# Patient Record
Sex: Female | Born: 1940 | Race: White | Hispanic: No | Smoking: Never smoker
Health system: Southern US, Community
[De-identification: ages and names within clinical notes are randomized; demographics above are authoritative.]

## PROBLEM LIST (undated history)

## (undated) DIAGNOSIS — F32A Depression, unspecified: Secondary | ICD-10-CM

## (undated) DIAGNOSIS — Z973 Presence of spectacles and contact lenses: Secondary | ICD-10-CM

## (undated) DIAGNOSIS — N814 Uterovaginal prolapse, unspecified: Secondary | ICD-10-CM

## (undated) DIAGNOSIS — S82899A Other fracture of unspecified lower leg, initial encounter for closed fracture: Secondary | ICD-10-CM

## (undated) DIAGNOSIS — F419 Anxiety disorder, unspecified: Secondary | ICD-10-CM

## (undated) DIAGNOSIS — M1990 Unspecified osteoarthritis, unspecified site: Secondary | ICD-10-CM

## (undated) DIAGNOSIS — D509 Iron deficiency anemia, unspecified: Secondary | ICD-10-CM

## (undated) DIAGNOSIS — L0390 Cellulitis, unspecified: Secondary | ICD-10-CM

## (undated) DIAGNOSIS — Z8709 Personal history of other diseases of the respiratory system: Secondary | ICD-10-CM

## (undated) DIAGNOSIS — F4323 Adjustment disorder with mixed anxiety and depressed mood: Secondary | ICD-10-CM

## (undated) DIAGNOSIS — Z8489 Family history of other specified conditions: Secondary | ICD-10-CM

## (undated) DIAGNOSIS — I10 Essential (primary) hypertension: Secondary | ICD-10-CM

## (undated) DIAGNOSIS — E785 Hyperlipidemia, unspecified: Secondary | ICD-10-CM

## (undated) DIAGNOSIS — K219 Gastro-esophageal reflux disease without esophagitis: Secondary | ICD-10-CM

## (undated) DIAGNOSIS — IMO0001 Reserved for inherently not codable concepts without codable children: Secondary | ICD-10-CM

## (undated) DIAGNOSIS — K2950 Unspecified chronic gastritis without bleeding: Secondary | ICD-10-CM

## (undated) DIAGNOSIS — E559 Vitamin D deficiency, unspecified: Secondary | ICD-10-CM

## (undated) DIAGNOSIS — K2970 Gastritis, unspecified, without bleeding: Secondary | ICD-10-CM

## (undated) DIAGNOSIS — Z960 Presence of urogenital implants: Secondary | ICD-10-CM

## (undated) DIAGNOSIS — R001 Bradycardia, unspecified: Secondary | ICD-10-CM

## (undated) DIAGNOSIS — I471 Supraventricular tachycardia, unspecified: Secondary | ICD-10-CM

## (undated) DIAGNOSIS — J45909 Unspecified asthma, uncomplicated: Secondary | ICD-10-CM

## (undated) DIAGNOSIS — D72819 Decreased white blood cell count, unspecified: Secondary | ICD-10-CM

## (undated) DIAGNOSIS — Z5189 Encounter for other specified aftercare: Secondary | ICD-10-CM

## (undated) DIAGNOSIS — J309 Allergic rhinitis, unspecified: Secondary | ICD-10-CM

## (undated) DIAGNOSIS — M810 Age-related osteoporosis without current pathological fracture: Secondary | ICD-10-CM

## (undated) DIAGNOSIS — K649 Unspecified hemorrhoids: Secondary | ICD-10-CM

## (undated) DIAGNOSIS — D500 Iron deficiency anemia secondary to blood loss (chronic): Secondary | ICD-10-CM

## (undated) DIAGNOSIS — E7800 Pure hypercholesterolemia, unspecified: Secondary | ICD-10-CM

## (undated) DIAGNOSIS — R002 Palpitations: Secondary | ICD-10-CM

## (undated) DIAGNOSIS — F329 Major depressive disorder, single episode, unspecified: Secondary | ICD-10-CM

## (undated) DIAGNOSIS — K5909 Other constipation: Secondary | ICD-10-CM

## (undated) DIAGNOSIS — A419 Sepsis, unspecified organism: Secondary | ICD-10-CM

## (undated) DIAGNOSIS — K625 Hemorrhage of anus and rectum: Secondary | ICD-10-CM

## (undated) HISTORY — PX: EYE SURGERY: SHX253

## (undated) HISTORY — DX: Uterovaginal prolapse, unspecified: N81.4

## (undated) HISTORY — DX: Iron deficiency anemia, unspecified: D50.9

## (undated) HISTORY — PX: PANENDOSCOPY: SHX2159

## (undated) HISTORY — PX: CATARACT EXTRACTION: SUR2

## (undated) HISTORY — PX: FRACTURE SURGERY: SHX138

## (undated) HISTORY — DX: Other fracture of unspecified lower leg, initial encounter for closed fracture: S82.899A

## (undated) HISTORY — PX: TUBAL LIGATION: SHX77

## (undated) HISTORY — DX: Allergic rhinitis, unspecified: J30.9

## (undated) HISTORY — DX: Reserved for inherently not codable concepts without codable children: IMO0001

## (undated) HISTORY — DX: Sepsis, unspecified organism: A41.9

## (undated) HISTORY — DX: Adjustment disorder with mixed anxiety and depressed mood: F43.23

## (undated) HISTORY — DX: Unspecified osteoarthritis, unspecified site: M19.90

## (undated) HISTORY — DX: Major depressive disorder, single episode, unspecified: F32.9

## (undated) HISTORY — DX: Cellulitis, unspecified: L03.90

## (undated) HISTORY — DX: Age-related osteoporosis without current pathological fracture: M81.0

## (undated) HISTORY — DX: Vitamin D deficiency, unspecified: E55.9

## (undated) HISTORY — DX: Pure hypercholesterolemia, unspecified: E78.00

## (undated) HISTORY — PX: CATARACT EXTRACTION W/ INTRAOCULAR LENS IMPLANT: SHX1309

## (undated) HISTORY — DX: Unspecified asthma, uncomplicated: J45.909

## (undated) HISTORY — DX: Gastritis, unspecified, without bleeding: K29.70

## (undated) HISTORY — DX: Encounter for other specified aftercare: Z51.89

## (undated) HISTORY — DX: Unspecified hemorrhoids: K64.9

---

## 1968-04-25 HISTORY — PX: ORIF FINGER / THUMB FRACTURE: SUR932

## 1975-04-26 HISTORY — PX: REVISION AMPUTATION OF FINGER: SHX2346

## 1998-04-25 HISTORY — PX: CATARACT EXTRACTION W/ INTRAOCULAR LENS IMPLANT: SHX1309

## 2000-01-27 ENCOUNTER — Other Ambulatory Visit: Admission: RE | Admit: 2000-01-27 | Discharge: 2000-01-27 | Payer: Self-pay | Admitting: Internal Medicine

## 2000-04-25 DIAGNOSIS — K2970 Gastritis, unspecified, without bleeding: Secondary | ICD-10-CM

## 2000-04-25 HISTORY — DX: Gastritis, unspecified, without bleeding: K29.70

## 2001-02-02 ENCOUNTER — Other Ambulatory Visit: Admission: RE | Admit: 2001-02-02 | Discharge: 2001-02-02 | Payer: Self-pay | Admitting: Internal Medicine

## 2001-03-12 ENCOUNTER — Encounter: Payer: Self-pay | Admitting: Gastroenterology

## 2001-03-20 ENCOUNTER — Encounter: Payer: Self-pay | Admitting: Gastroenterology

## 2001-03-28 ENCOUNTER — Ambulatory Visit (HOSPITAL_COMMUNITY): Admission: RE | Admit: 2001-03-28 | Discharge: 2001-03-28 | Payer: Self-pay | Admitting: Gastroenterology

## 2001-03-28 ENCOUNTER — Encounter: Payer: Self-pay | Admitting: Gastroenterology

## 2002-02-01 ENCOUNTER — Encounter: Payer: Self-pay | Admitting: Internal Medicine

## 2002-02-01 ENCOUNTER — Inpatient Hospital Stay (HOSPITAL_COMMUNITY): Admission: RE | Admit: 2002-02-01 | Discharge: 2002-02-03 | Payer: Self-pay | Admitting: Internal Medicine

## 2002-02-02 ENCOUNTER — Encounter (INDEPENDENT_AMBULATORY_CARE_PROVIDER_SITE_OTHER): Payer: Self-pay | Admitting: Specialist

## 2002-02-02 ENCOUNTER — Encounter: Payer: Self-pay | Admitting: Internal Medicine

## 2002-09-24 ENCOUNTER — Ambulatory Visit (HOSPITAL_COMMUNITY): Admission: RE | Admit: 2002-09-24 | Discharge: 2002-09-24 | Payer: Self-pay | Admitting: Gastroenterology

## 2003-04-26 LAB — HM COLONOSCOPY: HM Colonoscopy: NORMAL

## 2004-02-06 ENCOUNTER — Ambulatory Visit: Payer: Self-pay | Admitting: Internal Medicine

## 2004-02-17 ENCOUNTER — Other Ambulatory Visit: Admission: RE | Admit: 2004-02-17 | Discharge: 2004-02-17 | Payer: Self-pay | Admitting: Internal Medicine

## 2004-05-04 ENCOUNTER — Ambulatory Visit: Payer: Self-pay | Admitting: Internal Medicine

## 2004-07-13 ENCOUNTER — Ambulatory Visit: Payer: Self-pay | Admitting: Internal Medicine

## 2004-07-30 ENCOUNTER — Encounter: Admission: RE | Admit: 2004-07-30 | Discharge: 2004-07-30 | Payer: Self-pay | Admitting: Internal Medicine

## 2004-07-30 ENCOUNTER — Ambulatory Visit: Payer: Self-pay | Admitting: Internal Medicine

## 2004-08-05 ENCOUNTER — Ambulatory Visit: Payer: Self-pay | Admitting: Hematology and Oncology

## 2004-08-13 ENCOUNTER — Encounter: Payer: Self-pay | Admitting: Internal Medicine

## 2004-08-13 ENCOUNTER — Encounter (INDEPENDENT_AMBULATORY_CARE_PROVIDER_SITE_OTHER): Payer: Self-pay | Admitting: *Deleted

## 2004-08-13 ENCOUNTER — Other Ambulatory Visit: Admission: RE | Admit: 2004-08-13 | Discharge: 2004-08-13 | Payer: Self-pay | Admitting: Hematology and Oncology

## 2004-09-23 ENCOUNTER — Ambulatory Visit: Payer: Self-pay | Admitting: Hematology and Oncology

## 2004-10-12 ENCOUNTER — Ambulatory Visit (HOSPITAL_COMMUNITY): Admission: RE | Admit: 2004-10-12 | Discharge: 2004-10-12 | Payer: Self-pay | Admitting: Hematology and Oncology

## 2004-11-15 ENCOUNTER — Ambulatory Visit: Payer: Self-pay | Admitting: Hematology and Oncology

## 2005-02-01 ENCOUNTER — Ambulatory Visit: Payer: Self-pay | Admitting: Hematology and Oncology

## 2005-03-15 ENCOUNTER — Ambulatory Visit: Payer: Self-pay | Admitting: Internal Medicine

## 2005-03-29 ENCOUNTER — Encounter: Payer: Self-pay | Admitting: Internal Medicine

## 2005-03-29 ENCOUNTER — Other Ambulatory Visit: Admission: RE | Admit: 2005-03-29 | Discharge: 2005-03-29 | Payer: Self-pay | Admitting: Internal Medicine

## 2005-03-29 ENCOUNTER — Ambulatory Visit: Payer: Self-pay | Admitting: Internal Medicine

## 2005-03-29 LAB — CONVERTED CEMR LAB: Pap Smear: NORMAL

## 2005-03-31 ENCOUNTER — Ambulatory Visit: Payer: Self-pay | Admitting: Hematology and Oncology

## 2005-11-21 ENCOUNTER — Ambulatory Visit: Payer: Self-pay | Admitting: Internal Medicine

## 2006-01-23 ENCOUNTER — Ambulatory Visit: Payer: Self-pay | Admitting: Internal Medicine

## 2006-03-24 ENCOUNTER — Ambulatory Visit: Payer: Self-pay | Admitting: Internal Medicine

## 2006-03-24 LAB — CONVERTED CEMR LAB
ALT: 16 units/L (ref 0–40)
AST: 30 units/L (ref 0–37)
Albumin: 3.8 g/dL (ref 3.5–5.2)
Alkaline Phosphatase: 64 units/L (ref 39–117)
BUN: 20 mg/dL (ref 6–23)
Basophils Absolute: 0 10*3/uL (ref 0.0–0.1)
Basophils Relative: 0.4 % (ref 0.0–1.0)
CO2: 32 meq/L (ref 19–32)
Calcium: 9.4 mg/dL (ref 8.4–10.5)
Chloride: 105 meq/L (ref 96–112)
Chol/HDL Ratio, serum: 3.2
Cholesterol: 224 mg/dL (ref 0–200)
Creatinine, Ser: 0.7 mg/dL (ref 0.4–1.2)
Eosinophil percent: 4.6 % (ref 0.0–5.0)
GFR calc non Af Amer: 90 mL/min
Glomerular Filtration Rate, Af Am: 108 mL/min/{1.73_m2}
Glucose, Bld: 83 mg/dL (ref 70–99)
HCT: 31.5 % — ABNORMAL LOW (ref 36.0–46.0)
HDL: 69.3 mg/dL (ref >39.0)
Hemoglobin: 10.1 g/dL — ABNORMAL LOW (ref 12.0–15.0)
LDL DIRECT: 140.2 mg/dL
Lymphocytes Relative: 38.4 % (ref 12.0–46.0)
MCHC: 32.2 g/dL (ref 30.0–36.0)
MCV: 85 fL (ref 78.0–100.0)
Monocytes Absolute: 0.3 10*3/uL (ref 0.2–0.7)
Monocytes Relative: 8.8 % (ref 3.0–11.0)
Neutro Abs: 1.6 10*3/uL (ref 1.4–7.7)
Neutrophils Relative %: 47.8 % (ref 43.0–77.0)
Platelets: 194 10*3/uL (ref 150–400)
Potassium: 4.2 meq/L (ref 3.5–5.1)
RBC: 3.7 M/uL — ABNORMAL LOW (ref 3.87–5.11)
RDW: 16.8 % — ABNORMAL HIGH (ref 11.5–14.6)
Sodium: 143 meq/L (ref 135–145)
TSH: 1.51 microintl units/mL (ref 0.35–5.50)
Total Bilirubin: 0.7 mg/dL (ref 0.3–1.2)
Total Protein: 6.9 g/dL (ref 6.0–8.3)
Triglyceride fasting, serum: 64 mg/dL (ref 0–149)
VLDL: 13 mg/dL (ref 0–40)
WBC: 3.2 10*3/uL — ABNORMAL LOW (ref 4.5–10.5)

## 2006-03-31 ENCOUNTER — Ambulatory Visit: Payer: Self-pay | Admitting: Internal Medicine

## 2006-06-20 ENCOUNTER — Ambulatory Visit: Payer: Self-pay | Admitting: Internal Medicine

## 2006-09-20 ENCOUNTER — Ambulatory Visit: Payer: Self-pay | Admitting: Internal Medicine

## 2006-09-20 LAB — CONVERTED CEMR LAB
Basophils Absolute: 0 10*3/uL (ref 0.0–0.1)
Basophils Relative: 1.2 % — ABNORMAL HIGH (ref 0.0–1.0)
Eosinophils Absolute: 0.1 10*3/uL (ref 0.0–0.6)
Eosinophils Relative: 3.7 % (ref 0.0–5.0)
Ferritin: 17.4 ng/mL (ref 10.0–291.0)
HCT: 33.3 % — ABNORMAL LOW (ref 36.0–46.0)
Hemoglobin: 11.2 g/dL — ABNORMAL LOW (ref 12.0–15.0)
Lymphocytes Relative: 32.7 % (ref 12.0–46.0)
MCHC: 33.7 g/dL (ref 30.0–36.0)
MCV: 85.9 fL (ref 78.0–100.0)
Monocytes Absolute: 0.2 10*3/uL (ref 0.2–0.7)
Monocytes Relative: 9.1 % (ref 3.0–11.0)
Neutro Abs: 1.4 10*3/uL (ref 1.4–7.7)
Neutrophils Relative %: 53.3 % (ref 43.0–77.0)
Platelets: 186 10*3/uL (ref 150–400)
RBC: 3.87 M/uL (ref 3.87–5.11)
RDW: 15.7 % — ABNORMAL HIGH (ref 11.5–14.6)
WBC: 2.6 10*3/uL — ABNORMAL LOW (ref 4.5–10.5)

## 2006-09-21 ENCOUNTER — Encounter: Payer: Self-pay | Admitting: Internal Medicine

## 2006-09-21 LAB — CONVERTED CEMR LAB
Calcium, Total (PTH): 10.2 mg/dL (ref 8.4–10.5)
PTH: 18.7 pg/mL (ref 14.0–72.0)
Vit D, 1,25-Dihydroxy: 34 (ref 20–57)

## 2006-11-06 ENCOUNTER — Ambulatory Visit: Payer: Self-pay | Admitting: Internal Medicine

## 2006-12-15 ENCOUNTER — Telehealth: Payer: Self-pay | Admitting: Internal Medicine

## 2007-01-03 DIAGNOSIS — M1990 Unspecified osteoarthritis, unspecified site: Secondary | ICD-10-CM | POA: Insufficient documentation

## 2007-01-03 DIAGNOSIS — J45909 Unspecified asthma, uncomplicated: Secondary | ICD-10-CM

## 2007-01-03 DIAGNOSIS — M810 Age-related osteoporosis without current pathological fracture: Secondary | ICD-10-CM

## 2007-01-03 DIAGNOSIS — D500 Iron deficiency anemia secondary to blood loss (chronic): Secondary | ICD-10-CM | POA: Insufficient documentation

## 2007-01-03 DIAGNOSIS — D509 Iron deficiency anemia, unspecified: Secondary | ICD-10-CM

## 2007-01-03 DIAGNOSIS — J309 Allergic rhinitis, unspecified: Secondary | ICD-10-CM | POA: Insufficient documentation

## 2007-01-03 DIAGNOSIS — F329 Major depressive disorder, single episode, unspecified: Secondary | ICD-10-CM

## 2007-01-03 DIAGNOSIS — D649 Anemia, unspecified: Secondary | ICD-10-CM | POA: Insufficient documentation

## 2007-01-03 DIAGNOSIS — F3289 Other specified depressive episodes: Secondary | ICD-10-CM

## 2007-01-03 HISTORY — DX: Unspecified asthma, uncomplicated: J45.909

## 2007-01-03 HISTORY — DX: Major depressive disorder, single episode, unspecified: F32.9

## 2007-01-03 HISTORY — DX: Iron deficiency anemia, unspecified: D50.9

## 2007-01-03 HISTORY — DX: Allergic rhinitis, unspecified: J30.9

## 2007-01-03 HISTORY — DX: Other specified depressive episodes: F32.89

## 2007-01-03 HISTORY — DX: Unspecified osteoarthritis, unspecified site: M19.90

## 2007-01-03 HISTORY — DX: Age-related osteoporosis without current pathological fracture: M81.0

## 2007-01-12 ENCOUNTER — Telehealth: Payer: Self-pay | Admitting: Internal Medicine

## 2007-01-24 ENCOUNTER — Telehealth: Payer: Self-pay | Admitting: Internal Medicine

## 2007-02-27 ENCOUNTER — Telehealth: Payer: Self-pay | Admitting: *Deleted

## 2007-03-28 ENCOUNTER — Encounter: Payer: Self-pay | Admitting: Internal Medicine

## 2007-04-06 ENCOUNTER — Encounter: Payer: Self-pay | Admitting: Internal Medicine

## 2007-04-06 ENCOUNTER — Other Ambulatory Visit: Admission: RE | Admit: 2007-04-06 | Discharge: 2007-04-06 | Payer: Self-pay | Admitting: Internal Medicine

## 2007-04-06 ENCOUNTER — Ambulatory Visit: Payer: Self-pay | Admitting: Internal Medicine

## 2007-04-06 DIAGNOSIS — E559 Vitamin D deficiency, unspecified: Secondary | ICD-10-CM

## 2007-04-06 DIAGNOSIS — R0789 Other chest pain: Secondary | ICD-10-CM | POA: Insufficient documentation

## 2007-04-06 DIAGNOSIS — N814 Uterovaginal prolapse, unspecified: Secondary | ICD-10-CM | POA: Insufficient documentation

## 2007-04-06 HISTORY — DX: Vitamin D deficiency, unspecified: E55.9

## 2007-04-06 HISTORY — DX: Uterovaginal prolapse, unspecified: N81.4

## 2007-04-06 LAB — CONVERTED CEMR LAB
Bilirubin Urine: NEGATIVE
Glucose, Urine, Semiquant: NEGATIVE
Ketones, urine, test strip: NEGATIVE
Nitrite: NEGATIVE
Protein, U semiquant: NEGATIVE
Specific Gravity, Urine: 1.02
Urobilinogen, UA: 0.2
Vit D, 1,25-Dihydroxy: 39 (ref 30–89)
WBC Urine, dipstick: NEGATIVE
pH: 7

## 2007-04-09 ENCOUNTER — Telehealth: Payer: Self-pay | Admitting: Internal Medicine

## 2007-04-12 LAB — CONVERTED CEMR LAB
ALT: 19 units/L (ref 0–35)
AST: 25 units/L (ref 0–37)
Albumin: 4 g/dL (ref 3.5–5.2)
Alkaline Phosphatase: 68 units/L (ref 39–117)
BUN: 19 mg/dL (ref 6–23)
Basophils Absolute: 0 10*3/uL (ref 0.0–0.1)
Basophils Relative: 0.3 % (ref 0.0–1.0)
Bilirubin, Direct: 0.1 mg/dL (ref 0.0–0.3)
CO2: 33 meq/L — ABNORMAL HIGH (ref 19–32)
Calcium: 10 mg/dL (ref 8.4–10.5)
Chloride: 102 meq/L (ref 96–112)
Cholesterol: 242 mg/dL (ref 0–200)
Creatinine, Ser: 0.8 mg/dL (ref 0.4–1.2)
Direct LDL: 151.5 mg/dL
Eosinophils Absolute: 0.2 10*3/uL (ref 0.0–0.6)
Eosinophils Relative: 5.8 % — ABNORMAL HIGH (ref 0.0–5.0)
Ferritin: 18.1 ng/mL (ref 10.0–291.0)
GFR calc Af Amer: 93 mL/min
GFR calc non Af Amer: 77 mL/min
Glucose, Bld: 79 mg/dL (ref 70–99)
HCT: 37.2 % (ref 36.0–46.0)
HDL: 66.5 mg/dL (ref >39.0)
Hemoglobin: 12.2 g/dL (ref 12.0–15.0)
Lymphocytes Relative: 41.8 % (ref 12.0–46.0)
MCHC: 32.9 g/dL (ref 30.0–36.0)
MCV: 86.2 fL (ref 78.0–100.0)
Monocytes Absolute: 0.3 10*3/uL (ref 0.2–0.7)
Monocytes Relative: 9 % (ref 3.0–11.0)
Neutro Abs: 1.3 10*3/uL — ABNORMAL LOW (ref 1.4–7.7)
Neutrophils Relative %: 43.1 % (ref 43.0–77.0)
Platelets: 183 10*3/uL (ref 150–400)
Potassium: 4.1 meq/L (ref 3.5–5.1)
RBC: 4.31 M/uL (ref 3.87–5.11)
RDW: 14.2 % (ref 11.5–14.6)
Sodium: 142 meq/L (ref 135–145)
TSH: 0.85 microintl units/mL (ref 0.35–5.50)
Total Bilirubin: 0.6 mg/dL (ref 0.3–1.2)
Total CHOL/HDL Ratio: 3.6
Total Protein: 7.1 g/dL (ref 6.0–8.3)
Triglycerides: 89 mg/dL (ref 0–149)
VLDL: 18 mg/dL (ref 0–40)
WBC: 3 10*3/uL — ABNORMAL LOW (ref 4.5–10.5)

## 2007-04-15 LAB — CONVERTED CEMR LAB: Pap Smear: NORMAL

## 2007-04-17 ENCOUNTER — Ambulatory Visit: Payer: Self-pay

## 2007-04-17 ENCOUNTER — Encounter: Payer: Self-pay | Admitting: Internal Medicine

## 2007-04-25 ENCOUNTER — Telehealth: Payer: Self-pay | Admitting: Internal Medicine

## 2007-06-28 ENCOUNTER — Encounter: Payer: Self-pay | Admitting: Internal Medicine

## 2007-08-06 ENCOUNTER — Encounter: Payer: Self-pay | Admitting: Internal Medicine

## 2008-02-22 ENCOUNTER — Telehealth: Payer: Self-pay | Admitting: *Deleted

## 2008-04-02 ENCOUNTER — Encounter: Payer: Self-pay | Admitting: Internal Medicine

## 2008-05-06 ENCOUNTER — Telehealth: Payer: Self-pay | Admitting: *Deleted

## 2008-05-19 ENCOUNTER — Ambulatory Visit: Payer: Self-pay | Admitting: Internal Medicine

## 2008-05-19 DIAGNOSIS — E7800 Pure hypercholesterolemia, unspecified: Secondary | ICD-10-CM

## 2008-05-19 DIAGNOSIS — F4323 Adjustment disorder with mixed anxiety and depressed mood: Secondary | ICD-10-CM | POA: Insufficient documentation

## 2008-05-19 DIAGNOSIS — K649 Unspecified hemorrhoids: Secondary | ICD-10-CM

## 2008-05-19 HISTORY — DX: Adjustment disorder with mixed anxiety and depressed mood: F43.23

## 2008-05-19 HISTORY — DX: Unspecified hemorrhoids: K64.9

## 2008-05-19 HISTORY — DX: Pure hypercholesterolemia, unspecified: E78.00

## 2008-05-19 LAB — CONVERTED CEMR LAB
Bilirubin Urine: NEGATIVE
Glucose, Urine, Semiquant: NEGATIVE
Nitrite: NEGATIVE
Protein, U semiquant: NEGATIVE
Specific Gravity, Urine: 1.025
Urobilinogen, UA: 0.2
Vit D, 1,25-Dihydroxy: 47 (ref 30–89)
pH: 5.5

## 2008-05-23 LAB — CONVERTED CEMR LAB
ALT: 17 units/L (ref 0–35)
AST: 27 units/L (ref 0–37)
Albumin: 3.8 g/dL (ref 3.5–5.2)
Alkaline Phosphatase: 67 units/L (ref 39–117)
BUN: 19 mg/dL (ref 6–23)
Basophils Absolute: 0 10*3/uL (ref 0.0–0.1)
Basophils Relative: 0.8 % (ref 0.0–3.0)
Bilirubin, Direct: 0.1 mg/dL (ref 0.0–0.3)
CO2: 31 meq/L (ref 19–32)
Calcium: 9.2 mg/dL (ref 8.4–10.5)
Chloride: 107 meq/L (ref 96–112)
Cholesterol: 223 mg/dL (ref 0–200)
Creatinine, Ser: 0.8 mg/dL (ref 0.4–1.2)
Direct LDL: 133.2 mg/dL
Eosinophils Absolute: 0.1 10*3/uL (ref 0.0–0.7)
Eosinophils Relative: 3.9 % (ref 0.0–5.0)
Ferritin: 10 ng/mL (ref 10.0–291.0)
GFR calc Af Amer: 92 mL/min
GFR calc non Af Amer: 76 mL/min
Glucose, Bld: 83 mg/dL (ref 70–99)
HCT: 32 % — ABNORMAL LOW (ref 36.0–46.0)
HDL: 71.4 mg/dL (ref >39.0)
Hemoglobin: 10.8 g/dL — ABNORMAL LOW (ref 12.0–15.0)
Lymphocytes Relative: 30.7 % (ref 12.0–46.0)
MCHC: 33.8 g/dL (ref 30.0–36.0)
MCV: 85.8 fL (ref 78.0–100.0)
Monocytes Absolute: 0.3 10*3/uL (ref 0.1–1.0)
Monocytes Relative: 8.5 % (ref 3.0–12.0)
Neutro Abs: 2 10*3/uL (ref 1.4–7.7)
Neutrophils Relative %: 56.1 % (ref 43.0–77.0)
Platelets: 182 10*3/uL (ref 150–400)
Potassium: 3.7 meq/L (ref 3.5–5.1)
RBC: 3.73 M/uL — ABNORMAL LOW (ref 3.87–5.11)
RDW: 14.2 % (ref 11.5–14.6)
Sodium: 142 meq/L (ref 135–145)
TSH: 1.2 microintl units/mL (ref 0.35–5.50)
Total Bilirubin: 0.6 mg/dL (ref 0.3–1.2)
Total CHOL/HDL Ratio: 3.1
Total Protein: 7.3 g/dL (ref 6.0–8.3)
Triglycerides: 61 mg/dL (ref 0–149)
VLDL: 12 mg/dL (ref 0–40)
WBC: 3.4 10*3/uL — ABNORMAL LOW (ref 4.5–10.5)

## 2008-06-02 ENCOUNTER — Telehealth: Payer: Self-pay | Admitting: *Deleted

## 2008-06-11 ENCOUNTER — Ambulatory Visit: Payer: Self-pay | Admitting: Internal Medicine

## 2008-06-11 LAB — CONVERTED CEMR LAB: Hemoglobin: 11.4 g/dL

## 2008-07-08 ENCOUNTER — Telehealth: Payer: Self-pay | Admitting: *Deleted

## 2008-07-22 ENCOUNTER — Telehealth: Payer: Self-pay | Admitting: *Deleted

## 2008-09-10 ENCOUNTER — Ambulatory Visit: Payer: Self-pay | Admitting: Internal Medicine

## 2008-10-06 LAB — CONVERTED CEMR LAB: Iron: 26 ug/dL — ABNORMAL LOW (ref 42–145)

## 2008-10-21 ENCOUNTER — Ambulatory Visit: Payer: Self-pay | Admitting: Internal Medicine

## 2008-11-03 LAB — CONVERTED CEMR LAB
Basophils Absolute: 0 10*3/uL (ref 0.0–0.1)
Basophils Relative: 0.4 % (ref 0.0–3.0)
Eosinophils Absolute: 0.1 10*3/uL (ref 0.0–0.7)
Eosinophils Relative: 4.3 % (ref 0.0–5.0)
Ferritin: 12.8 ng/mL (ref 10.0–291.0)
Folate: 19 ng/mL
Free T4: 0.8 ng/dL (ref 0.6–1.6)
HCT: 32.6 % — ABNORMAL LOW (ref 36.0–46.0)
Hemoglobin: 10.8 g/dL — ABNORMAL LOW (ref 12.0–15.0)
Iron: 27 ug/dL — ABNORMAL LOW (ref 42–145)
Lymphocytes Relative: 37.3 % (ref 12.0–46.0)
Lymphs Abs: 1.2 10*3/uL (ref 0.7–4.0)
MCHC: 33.1 g/dL (ref 30.0–36.0)
MCV: 84.3 fL (ref 78.0–100.0)
Monocytes Absolute: 0.2 10*3/uL (ref 0.1–1.0)
Monocytes Relative: 7.4 % (ref 3.0–12.0)
Neutro Abs: 1.8 10*3/uL (ref 1.4–7.7)
Neutrophils Relative %: 50.6 % (ref 43.0–77.0)
Platelets: 192 10*3/uL (ref 150.0–400.0)
RBC: 3.86 M/uL — ABNORMAL LOW (ref 3.87–5.11)
RDW: 15.6 % — ABNORMAL HIGH (ref 11.5–14.6)
Saturation Ratios: 7.2 % — ABNORMAL LOW (ref 20.0–50.0)
TSH: 0.71 microintl units/mL (ref 0.35–5.50)
Transferrin: 267.9 mg/dL (ref 212.0–360.0)
Vitamin B-12: 915 pg/mL — ABNORMAL HIGH (ref 211–911)
WBC: 3.3 10*3/uL — ABNORMAL LOW (ref 4.5–10.5)

## 2008-11-07 ENCOUNTER — Telehealth: Payer: Self-pay | Admitting: Internal Medicine

## 2008-11-07 ENCOUNTER — Ambulatory Visit: Payer: Self-pay | Admitting: Internal Medicine

## 2009-02-23 HISTORY — PX: ANKLE FRACTURE SURGERY: SHX122

## 2009-03-03 ENCOUNTER — Telehealth: Payer: Self-pay | Admitting: *Deleted

## 2009-03-10 ENCOUNTER — Ambulatory Visit: Payer: Self-pay | Admitting: Internal Medicine

## 2009-03-20 ENCOUNTER — Inpatient Hospital Stay (HOSPITAL_COMMUNITY): Admission: EM | Admit: 2009-03-20 | Discharge: 2009-03-24 | Payer: Self-pay | Admitting: Emergency Medicine

## 2009-03-21 ENCOUNTER — Encounter (INDEPENDENT_AMBULATORY_CARE_PROVIDER_SITE_OTHER): Payer: Self-pay | Admitting: *Deleted

## 2009-03-21 HISTORY — PX: ORIF ANKLE FRACTURE: SUR919

## 2009-03-23 ENCOUNTER — Encounter (INDEPENDENT_AMBULATORY_CARE_PROVIDER_SITE_OTHER): Payer: Self-pay | Admitting: *Deleted

## 2009-03-25 DIAGNOSIS — Z5189 Encounter for other specified aftercare: Secondary | ICD-10-CM

## 2009-03-25 HISTORY — DX: Encounter for other specified aftercare: Z51.89

## 2009-03-27 ENCOUNTER — Telehealth: Payer: Self-pay | Admitting: Internal Medicine

## 2009-04-08 ENCOUNTER — Telehealth: Payer: Self-pay | Admitting: Internal Medicine

## 2009-04-10 ENCOUNTER — Ambulatory Visit: Payer: Self-pay | Admitting: Internal Medicine

## 2009-04-10 ENCOUNTER — Inpatient Hospital Stay (HOSPITAL_COMMUNITY): Admission: EM | Admit: 2009-04-10 | Discharge: 2009-04-12 | Payer: Self-pay | Admitting: Emergency Medicine

## 2009-04-10 DIAGNOSIS — R42 Dizziness and giddiness: Secondary | ICD-10-CM | POA: Insufficient documentation

## 2009-04-10 DIAGNOSIS — R300 Dysuria: Secondary | ICD-10-CM | POA: Insufficient documentation

## 2009-04-10 DIAGNOSIS — S82899A Other fracture of unspecified lower leg, initial encounter for closed fracture: Secondary | ICD-10-CM

## 2009-04-10 HISTORY — DX: Other fracture of unspecified lower leg, initial encounter for closed fracture: S82.899A

## 2009-04-10 LAB — CONVERTED CEMR LAB
BUN: 22 mg/dL (ref 6–23)
Basophils Absolute: 0 10*3/uL (ref 0.0–0.1)
Basophils Relative: 0.1 % (ref 0.0–3.0)
Bilirubin Urine: NEGATIVE
CO2: 30 meq/L (ref 19–32)
Calcium: 8.9 mg/dL (ref 8.4–10.5)
Chloride: 102 meq/L (ref 96–112)
Creatinine, Ser: 0.8 mg/dL (ref 0.4–1.2)
Eosinophils Absolute: 0.1 10*3/uL (ref 0.0–0.7)
Eosinophils Relative: 1.4 % (ref 0.0–5.0)
GFR calc non Af Amer: 76 mL/min (ref >60)
Glucose, Bld: 93 mg/dL (ref 70–99)
Glucose, Urine, Semiquant: NEGATIVE
HCT: 14.9 % — CL (ref 36.0–46.0)
Hemoglobin: 4.6 g/dL — CL (ref 12.0–15.0)
Ketones, urine, test strip: NEGATIVE
Lymphocytes Relative: 12.8 % (ref 12.0–46.0)
Lymphs Abs: 0.9 10*3/uL (ref 0.7–4.0)
MCHC: 31.1 g/dL (ref 30.0–36.0)
MCV: 92.4 fL (ref 78.0–100.0)
Monocytes Absolute: 0.5 10*3/uL (ref 0.1–1.0)
Monocytes Relative: 7.7 % (ref 3.0–12.0)
Neutro Abs: 5.2 10*3/uL (ref 1.4–7.7)
Neutrophils Relative %: 78 % — ABNORMAL HIGH (ref 43.0–77.0)
Nitrite: POSITIVE
Platelets: 327 10*3/uL (ref 150.0–400.0)
Potassium: 4.4 meq/L (ref 3.5–5.1)
RBC: 1.61 M/uL — ABNORMAL LOW (ref 3.87–5.11)
RDW: 23.5 % — ABNORMAL HIGH (ref 11.5–14.6)
Sodium: 138 meq/L (ref 135–145)
Specific Gravity, Urine: 1.015
Urobilinogen, UA: 0.2
WBC: 6.7 10*3/uL (ref 4.5–10.5)
pH: 6

## 2009-04-11 ENCOUNTER — Ambulatory Visit: Payer: Self-pay | Admitting: Internal Medicine

## 2009-04-12 ENCOUNTER — Encounter (INDEPENDENT_AMBULATORY_CARE_PROVIDER_SITE_OTHER): Payer: Self-pay | Admitting: Internal Medicine

## 2009-04-12 ENCOUNTER — Encounter: Payer: Self-pay | Admitting: Internal Medicine

## 2009-04-13 ENCOUNTER — Telehealth: Payer: Self-pay | Admitting: *Deleted

## 2009-04-13 ENCOUNTER — Encounter (INDEPENDENT_AMBULATORY_CARE_PROVIDER_SITE_OTHER): Payer: Self-pay | Admitting: *Deleted

## 2009-04-14 ENCOUNTER — Encounter: Payer: Self-pay | Admitting: Internal Medicine

## 2009-04-15 ENCOUNTER — Telehealth: Payer: Self-pay | Admitting: *Deleted

## 2009-04-20 ENCOUNTER — Ambulatory Visit: Payer: Self-pay | Admitting: Internal Medicine

## 2009-04-20 LAB — CONVERTED CEMR LAB
Basophils Absolute: 0.1 10*3/uL (ref 0.0–0.1)
Basophils Relative: 1.8 % (ref 0.0–3.0)
Eosinophils Absolute: 0.3 10*3/uL (ref 0.0–0.7)
Eosinophils Relative: 9.1 % — ABNORMAL HIGH (ref 0.0–5.0)
HCT: 30.8 % — ABNORMAL LOW (ref 36.0–46.0)
Hemoglobin: 9.8 g/dL — ABNORMAL LOW (ref 12.0–15.0)
Lymphocytes Relative: 26.2 % (ref 12.0–46.0)
Lymphs Abs: 1 10*3/uL (ref 0.7–4.0)
MCHC: 31.6 g/dL (ref 30.0–36.0)
MCV: 91 fL (ref 78.0–100.0)
Monocytes Absolute: 0.3 10*3/uL (ref 0.1–1.0)
Monocytes Relative: 8.1 % (ref 3.0–12.0)
Neutro Abs: 2 10*3/uL (ref 1.4–7.7)
Neutrophils Relative %: 54.8 % (ref 43.0–77.0)
Platelets: 326 10*3/uL (ref 150.0–400.0)
RBC: 3.39 M/uL — ABNORMAL LOW (ref 3.87–5.11)
RDW: 16.9 % — ABNORMAL HIGH (ref 11.5–14.6)
WBC: 3.7 10*3/uL — ABNORMAL LOW (ref 4.5–10.5)

## 2009-04-27 ENCOUNTER — Ambulatory Visit: Payer: Self-pay | Admitting: Internal Medicine

## 2009-04-27 LAB — CONVERTED CEMR LAB
Bilirubin Urine: NEGATIVE
Glucose, Urine, Semiquant: NEGATIVE
Ketones, urine, test strip: NEGATIVE
Nitrite: POSITIVE
Specific Gravity, Urine: 1.02
Urobilinogen, UA: 0.2
pH: 7

## 2009-04-28 ENCOUNTER — Encounter: Payer: Self-pay | Admitting: Internal Medicine

## 2009-04-29 ENCOUNTER — Telehealth: Payer: Self-pay | Admitting: *Deleted

## 2009-05-07 ENCOUNTER — Ambulatory Visit: Payer: Self-pay | Admitting: Gastroenterology

## 2009-05-13 ENCOUNTER — Encounter: Payer: Self-pay | Admitting: Internal Medicine

## 2009-05-13 LAB — HM MAMMOGRAPHY: HM Mammogram: NORMAL

## 2009-05-18 ENCOUNTER — Ambulatory Visit: Payer: Self-pay | Admitting: Internal Medicine

## 2009-05-19 LAB — CONVERTED CEMR LAB
ALT: 19 units/L (ref 0–35)
AST: 28 units/L (ref 0–37)
Albumin: 3.8 g/dL (ref 3.5–5.2)
Alkaline Phosphatase: 75 units/L (ref 39–117)
BUN: 16 mg/dL (ref 6–23)
Basophils Absolute: 0 10*3/uL (ref 0.0–0.1)
Basophils Relative: 0.6 % (ref 0.0–3.0)
Bilirubin, Direct: 0 mg/dL (ref 0.0–0.3)
CO2: 32 meq/L (ref 19–32)
Calcium: 9.9 mg/dL (ref 8.4–10.5)
Chloride: 101 meq/L (ref 96–112)
Cholesterol: 238 mg/dL — ABNORMAL HIGH (ref 0–200)
Creatinine, Ser: 0.7 mg/dL (ref 0.4–1.2)
Direct LDL: 156.5 mg/dL
Eosinophils Absolute: 0.1 10*3/uL (ref 0.0–0.7)
Eosinophils Relative: 5.3 % — ABNORMAL HIGH (ref 0.0–5.0)
Ferritin: 31.9 ng/mL (ref 10.0–291.0)
GFR calc non Af Amer: 88.63 mL/min (ref >60)
Glucose, Bld: 83 mg/dL (ref 70–99)
HCT: 35 % — ABNORMAL LOW (ref 36.0–46.0)
HDL: 65.8 mg/dL (ref >39.00)
Hemoglobin: 11.2 g/dL — ABNORMAL LOW (ref 12.0–15.0)
LH: 38.8 milliintl units/mL
Lymphocytes Relative: 44 % (ref 12.0–46.0)
Lymphs Abs: 1.3 10*3/uL (ref 0.7–4.0)
MCHC: 32 g/dL (ref 30.0–36.0)
MCV: 85.7 fL (ref 78.0–100.0)
Monocytes Absolute: 0.2 10*3/uL (ref 0.1–1.0)
Monocytes Relative: 8.4 % (ref 3.0–12.0)
Neutro Abs: 1.1 10*3/uL — ABNORMAL LOW (ref 1.4–7.7)
Neutrophils Relative %: 41.7 % — ABNORMAL LOW (ref 43.0–77.0)
Platelets: 169 10*3/uL (ref 150.0–400.0)
Potassium: 4.2 meq/L (ref 3.5–5.1)
RBC: 4.08 M/uL (ref 3.87–5.11)
RDW: 16.2 % — ABNORMAL HIGH (ref 11.5–14.6)
Sodium: 140 meq/L (ref 135–145)
TSH: 1.07 microintl units/mL (ref 0.35–5.50)
Total Bilirubin: 0.6 mg/dL (ref 0.3–1.2)
Total CHOL/HDL Ratio: 4
Total Protein: 8 g/dL (ref 6.0–8.3)
Triglycerides: 77 mg/dL (ref 0.0–149.0)
VLDL: 15.4 mg/dL (ref 0.0–40.0)
WBC: 2.7 10*3/uL — ABNORMAL LOW (ref 4.5–10.5)

## 2009-05-28 ENCOUNTER — Telehealth: Payer: Self-pay | Admitting: *Deleted

## 2009-06-22 ENCOUNTER — Ambulatory Visit: Payer: Self-pay | Admitting: Internal Medicine

## 2009-06-26 LAB — CONVERTED CEMR LAB
Basophils Absolute: 0 10*3/uL (ref 0.0–0.1)
Basophils Relative: 0.9 % (ref 0.0–3.0)
Eosinophils Absolute: 0.1 10*3/uL (ref 0.0–0.7)
Eosinophils Relative: 3.8 % (ref 0.0–5.0)
Folate: >20 ng/mL
HCT: 37.4 % (ref 36.0–46.0)
Hemoglobin: 12 g/dL (ref 12.0–15.0)
Iron: 68 ug/dL (ref 42–145)
Lymphocytes Relative: 42.6 % (ref 12.0–46.0)
Lymphs Abs: 1.4 10*3/uL (ref 0.7–4.0)
MCHC: 32.1 g/dL (ref 30.0–36.0)
MCV: 84.9 fL (ref 78.0–100.0)
Monocytes Absolute: 0.3 10*3/uL (ref 0.1–1.0)
Monocytes Relative: 10 % (ref 3.0–12.0)
Neutro Abs: 1.3 10*3/uL — ABNORMAL LOW (ref 1.4–7.7)
Neutrophils Relative %: 42.7 % — ABNORMAL LOW (ref 43.0–77.0)
Platelets: 177 10*3/uL (ref 150.0–400.0)
RBC: 4.41 M/uL (ref 3.87–5.11)
RDW: 16.2 % — ABNORMAL HIGH (ref 11.5–14.6)
Saturation Ratios: 18.8 % — ABNORMAL LOW (ref 20.0–50.0)
Transferrin: 257.7 mg/dL (ref 212.0–360.0)
Vitamin B-12: 767 pg/mL (ref 211–911)
WBC: 3.1 10*3/uL — ABNORMAL LOW (ref 4.5–10.5)

## 2010-01-25 ENCOUNTER — Telehealth: Payer: Self-pay | Admitting: Internal Medicine

## 2010-01-27 ENCOUNTER — Ambulatory Visit: Payer: Self-pay | Admitting: Internal Medicine

## 2010-01-27 DIAGNOSIS — M25519 Pain in unspecified shoulder: Secondary | ICD-10-CM | POA: Insufficient documentation

## 2010-01-27 DIAGNOSIS — R5381 Other malaise: Secondary | ICD-10-CM | POA: Insufficient documentation

## 2010-01-27 DIAGNOSIS — R209 Unspecified disturbances of skin sensation: Secondary | ICD-10-CM | POA: Insufficient documentation

## 2010-01-27 DIAGNOSIS — R5383 Other fatigue: Secondary | ICD-10-CM

## 2010-01-27 LAB — CONVERTED CEMR LAB
Anti Nuclear Antibody(ANA): NEGATIVE
CRP: 0.2 mg/dL (ref <0.6)

## 2010-01-29 LAB — CONVERTED CEMR LAB
BUN: 19 mg/dL (ref 6–23)
Basophils Absolute: 0 10*3/uL (ref 0.0–0.1)
Basophils Relative: 1.1 % (ref 0.0–3.0)
CO2: 33 meq/L — ABNORMAL HIGH (ref 19–32)
Calcium: 10 mg/dL (ref 8.4–10.5)
Chloride: 101 meq/L (ref 96–112)
Creatinine, Ser: 0.8 mg/dL (ref 0.4–1.2)
Eosinophils Absolute: 0.1 10*3/uL (ref 0.0–0.7)
Eosinophils Relative: 2.5 % (ref 0.0–5.0)
Ferritin: 25.1 ng/mL (ref 10.0–291.0)
GFR calc non Af Amer: 76.92 mL/min (ref >60)
Glucose, Bld: 81 mg/dL (ref 70–99)
HCT: 33.6 % — ABNORMAL LOW (ref 36.0–46.0)
Hemoglobin: 11.2 g/dL — ABNORMAL LOW (ref 12.0–15.0)
Iron: 104 ug/dL (ref 42–145)
Lymphocytes Relative: 34.1 % (ref 12.0–46.0)
Lymphs Abs: 1.1 10*3/uL (ref 0.7–4.0)
MCHC: 33.5 g/dL (ref 30.0–36.0)
MCV: 87.1 fL (ref 78.0–100.0)
Monocytes Absolute: 0.3 10*3/uL (ref 0.1–1.0)
Monocytes Relative: 8.3 % (ref 3.0–12.0)
Neutro Abs: 1.7 10*3/uL (ref 1.4–7.7)
Neutrophils Relative %: 54 % (ref 43.0–77.0)
Platelets: 193 10*3/uL (ref 150.0–400.0)
Potassium: 4.8 meq/L (ref 3.5–5.1)
RBC: 3.86 M/uL — ABNORMAL LOW (ref 3.87–5.11)
RDW: 16.3 % — ABNORMAL HIGH (ref 11.5–14.6)
Saturation Ratios: 26.9 % (ref 20.0–50.0)
Sodium: 141 meq/L (ref 135–145)
TSH: 1.01 microintl units/mL (ref 0.35–5.50)
Transferrin: 276.6 mg/dL (ref 212.0–360.0)
WBC: 3.2 10*3/uL — ABNORMAL LOW (ref 4.5–10.5)

## 2010-05-19 ENCOUNTER — Encounter: Payer: Self-pay | Admitting: Internal Medicine

## 2010-05-25 NOTE — Assessment & Plan Note (Signed)
Summary: post hospital f/u per Dr. Elissa Lovett   Vital Signs:  Patient profile:   70 year old female Menstrual status:  postmenopausal Temp:     98.8 degrees F oral Pulse rate:   88 / minute BP sitting:   120 / 70  (left arm) Cuff size:   regular  Vitals Entered By: Romualdo Bolk, CMA (AAMA) (April 27, 2009 11:22 AM) CC: Post Hospital Follow up   History of Present Illness: Belinda Harper comesin today   as follow up of  hospitalization for  severe Iron defic anemia  s/p surgery for left ankle fracture and 3-4 weeks of asa with hemorrhoidal bleeding and ?erosive gastritis.  She had 4 uints of blood and said she had   a hive reaction to" something they gave her ? plasma ?" ffp ?and this was stopped.  Has follow up with Gi but doesnt want to repeat the capsule endoscopy that she had in the past   at present. Overall her fatigue and dizziness are much better. She thinks the asa was the problem.   She had been rx for uti   and thinks its gone but not sure  ocass dysuria.  Unsure what culture showed.  had been on cipro.  Fracture  in boot   some foot swelling  to see ortho this week of so.   Taking calcium /d 1.5 hours from her iron 3 two times a day with vitamin c.  Aks about myelodysplasia as a dx but had a bone marrow bx in the past .  per heme New symptom is dry irritative cough ever since had transfusion ? of endo NO cp sob or wheezing .  nusiance feeling.  Preventive Screening-Counseling & Management  Alcohol-Tobacco     Alcohol drinks/day: <1     Alcohol type: wine     Smoking Status: never  Caffeine-Diet-Exercise     Caffeine use/day: two a week maybe more     Does Patient Exercise: yes     Type of exercise: walk  Current Medications (verified): 1)  Actonel 35 Mg Tabs (Risedronate Sodium) .[name].. 1 By Mouth Every Week 2)  Fergon 240 Mg  Tabs (Ferrous Gluconate) .... 3 Two Times A Day 3)  Vitamin C 250 Mg  Tabs (Ascorbic Acid) .... 3 Two Times A Day 4)  One-A-Day Extras  Antioxidant   Caps (Multiple Vitamins-Minerals) 5)  Vitamin D 1000 Unit Tabs (Cholecalciferol) .... Daily 6)  Calcium Carbonate   Powd (Calcium Carbonate) .... 1500mg  Daily 7)  Anusol-Hc 25 Mg Supp (Hydrocortisone Acetate) .... Insert Rectally Two Times A Day X 2 Weeks. 8)  Cipro 500 Mg Tabs (Ciprofloxacin Hcl) .[name].. 1 By Mouth Two Times A Day 9)  Methocarbamol 500 Mg Tabs (Methocarbamol) .[name].. 1 By Mouth Three Times A Day As Needed For Spasms 10)  Stool Softener 100 Mg Caps (Docusate Sodium) .[name].. 1 Two Times A Day  Allergies (verified): 1)  Sulfamethoxazole (Sulfamethoxazole)  Past History:  Past medical, surgical, family and social histories (including risk factors) reviewed, and no changes noted (except as noted below).  Past Medical History: Arthritis Allergies Allergic rhinitis Anemia-iron deficiency requiring transfusion and continued iron therapy  hg 4    transfusion 4 units  12 2010 Asthma Depression Osteoarthritis Osteoporosis  dexa -3.o hip 2006 Consults Dr. Smith Robert Odogwu- Oncology  has had bone marrow bx in the past. [facility]- GI Dr. Janey Greaser consult for endometrial  bx and bleed 2009 neg  LAST Mammogram: 04/02/08 Pap: 03/2007 Td: May 19, 2008 Colonscopy: 2008 EKG: 04/06/07 Dexa: 03/28/2007 Eye Exam: 10/09 Other: Smoking: Never  Past Surgical History: Reviewed history from 04/10/2009 and no changes required. Panendoscopy small bowel bx Left trimalleolar ankle fracture dislocation Nov 2010  Family History: Reviewed history from 05/19/2008 and no changes required. Family History of Arthritis Family History High cholesterol Family History Osteoporosis Family History Ovarian cancer Family History Uterine cancer Family History of Cardiovascular disorder father and Mom around 74's  Father:  Heart disorder, arthritis, melanomia Mother: Heart Disorder, high cholesterol, ovarian and uterine cancer, osteoprosis, Arthritis Siblings: Died at age  1   Social History: Reviewed history from 10/21/2008 and no changes required. Occupation: retired Building surveyor Never Smoked Alcohol use-no Drug use-no Regular exercise-yes Divorced HHof 4     2 teens.    at home now  moved out     Review of Systems       The patient complains of prolonged cough.  The patient denies anorexia, fever, weight loss, vision loss, peripheral edema, hemoptysis, abdominal pain, melena, severe indigestion/heartburn, hematuria, transient blindness, unusual weight change, enlarged lymph nodes, angioedema, and breast masses.    Physical Exam  General:  Well-developed,well-nourished,in no acute distress; alert,appropriate and cooperative throughout examination Head:  normocephalic and atraumatic.   Eyes:  vision grossly intact and pupils equal.   Neck:  No deformities, masses, or tenderness noted. Lungs:  Normal respiratory effort, chest expands symmetrically. Lungs are clear to auscultation, no crackles or wheezes.no dullness.   Heart:  Normal rate and regular rhythm. S1 and S2 normal without gallop, murmur, click, rub or other extra sounds. Pulses:  nl cap refill  Extremities:  lle  1+ swelling toes  nl cap refill  Skin:  turgor normal, color normal, no ecchymoses, and no petechiae.   Cervical Nodes:  No lymphadenopathy noted Psych:  Oriented X3, good eye contact, not anxious appearing, and not depressed appearing.   hosp record  obtained and reviewed .  dc hg was 9.1 on dc 12 /18  ,   9.8 today  Impression & Recommendations:  Problem # 1:  ANEMIA-IRON DEFICIENCY (ICD-280.9) with hx of same s/p transfusion 4 units and hosp.  has stopped asa and still has a bit bleed from hemm.  egd showed ?minor erosive gastritis  in hosp.  her energy level is better an dizziness gone.  She has had evaluations  in the past that has incuded heme and Gi evaluations.  no dx  but fe defic anemia.  Her updated medication list for this problem includes:    Fergon 240 Mg  Tabs (Ferrous gluconate) .Marland KitchenMarland KitchenMarland KitchenMarland Kitchen 3 two times a day  Problem # 2:  COUGH (ICD-786.2) dry  irritative without ur signs fever or  malaise .  she relates it to being in hosp and getting transfusion? .   nl exam .  will follow   Problem # 3:  DYSURIA (ICD-788.1) better  and had rx with cipro for staph sppc   worried about this coming back will check ua today.  culture and treat as appropriate. Her updated medication list for this problem includes:    Cipro 500 Mg Tabs (Ciprofloxacin hcl) .[name]... 1 by mouth two times a day    Macrobid 100 Mg Caps (Nitrofurantoin monohyd macro) .[name]... 1 by mouth two times a day for 7 days  Orders: UA Dipstick w/o Micro (automated)  (81003) T-Culture, Urine (19147-82956)  Problem # 4:  OSTEOPOROSIS (ICD-733.00) ? if actonel can  contribute to  cough or gi abs of iron?       Her updated medication list for this problem includes:    Actonel 35 Mg Tabs (Risedronate sodium) .[name]... 1 by mouth every week    Vitamin D 1000 Unit Tabs (Cholecalciferol) .[name]... Daily    Calcium Carbonate Powd (Calcium carbonate) ..... 1500mg  daily  Complete Medication List: 1)  Actonel 35 Mg Tabs (Risedronate sodium) .[name].. 1 by mouth every week 2)  Fergon 240 Mg Tabs (Ferrous gluconate) .... 3 two times a day 3)  Vitamin C 250 Mg Tabs (Ascorbic acid) .... 3 two times a day 4)  One-a-day Extras Antioxidant Caps (Multiple vitamins-minerals) 5)  Vitamin D 1000 Unit Tabs (Cholecalciferol) .... Daily 6)  Calcium Carbonate Powd (Calcium carbonate) .... 1500mg  daily 7)  Anusol-hc 25 Mg Supp (Hydrocortisone acetate) .... Insert rectally two times a day x 2 weeks. 8)  Cipro 500 Mg Tabs (Ciprofloxacin hcl) .[name].. 1 by mouth two times a day 9)  Methocarbamol 500 Mg Tabs (Methocarbamol) .[name].. 1 by mouth three times a day as needed for spasms 10)  Stool Softener 100 Mg Caps (Docusate sodium) .[name].. 1 two times a day 11)  Macrobid 100 Mg Caps (Nitrofurantoin monohyd macro) .[name].. 1 by mouth two times a day for 7  days  Patient Instructions: 1)  continue iron supplements  2)  ask Gi about actonel poss contribute to UGI problem  and advisability of holding med for a short while.  3)  in 3 weeks  cbc  diff, LFTS , ferritin, lipids, BMP, LFTS , TSH , 4)  Dx  Anemia , Gastritis, osteoporosis, Elevated   lipids, fatigue. cough  5)  then plan follow up  6)  keep  yearly visit appt in the meantime.   Laboratory Results   Urine Tests    Routine Urinalysis   Color: yellow Appearance: Clear Glucose: negative   (Normal Range: Negative) Bilirubin: negative   (Normal Range: Negative) Ketone: negative   (Normal Range: Negative) Spec. Gravity: 1.020   (Normal Range: 1.003-1.035) Blood: trace-intact   (Normal Range: Negative) pH: 7.0   (Normal Range: 5.0-8.0) Protein: 1+   (Normal Range: Negative) Urobilinogen: 0.2   (Normal Range: 0-1) Nitrite: positive   (Normal Range: Negative) Leukocyte Esterace: 3+   (Normal Range: Negative)    Comments: Rita Ohara  April 27, 2009 12:59 PM

## 2010-05-25 NOTE — Progress Notes (Signed)
Summary: refill  Phone Note From Pharmacy   Caller: [facility]* Reason for Call: Needs renewal Details for Reason: Folic Acid Initial call taken by: Romualdo Bolk, CMA,  July 08, 2008 3:41 PM  Follow-up for Phone Call        Sent rx via electronically  Follow-up by: Romualdo Bolk, CMA,  July 08, 2008 3:41 PM      Prescriptions: FOLIC ACID 1 MG TABS (FOLIC ACID) 1 by mouth once daily  #30 x 5   Entered by:   Romualdo Bolk, CMA   Authorized by:   Madelin Headings MD   Signed by:   Romualdo Bolk, CMA on 07/08/2008   Method used:   Electronically to        [facility]* (retail)       [address] [place], [place]  [address]       Ph: [phone] or [phone]       Fax: [phone]   RxID:   [id number]

## 2010-05-25 NOTE — Progress Notes (Signed)
Summary: refil  Phone Note Refill Request Message from:  Fax from Pharmacy  Refills Requested: Medication #1:  ACTONEL 35 MG TABS 1 by mouth every week [facility] fax----[phone]  Initial call taken by: Warnell Forester,  May 28, 2009 3:00 PM  Follow-up for Phone Call        Per Pt okay to send to [facility]. Rx [facility] Follow-up by: Romualdo Bolk, CMA (AAMA),  May 29, 2009 2:26 PM    Prescriptions: ACTONEL 35 MG TABS (RISEDRONATE SODIUM) 1 by mouth every week  #12 x 3   Entered by:   Romualdo Bolk, CMA (AAMA)   Authorized by:   Madelin Headings MD   Signed by:   Romualdo Bolk, CMA (AAMA) on 05/29/2009   Method used:   Electronically to        [facility]* (mail-order)             ,          Ph: [phone]       Fax: [phone]   RxID:   [id number]

## 2010-05-25 NOTE — Procedures (Signed)
Summary: Upper Endoscopy  Patient: Belinda Harper Note: All result statuses are Final unless otherwise noted.  Tests: (1) Upper Endoscopy (EGD)   EGD Upper Endoscopy       DONE     [facility]     [address]     [place], [place]  [address]           ENDOSCOPY PROCEDURE REPORT           PATIENT:  [name], [name]  MR#:  [id number]     BIRTHDATE:  [birth year], 68 yrs. old  GENDER:  female           ENDOSCOPIST:  Wilhemina Bonito. Eda Keys, MD     Referred by:  [name]           PROCEDURE DATE:  04/12/2009     PROCEDURE:  EGD with biopsy     ASA CLASS:  Class II     INDICATIONS:  iron deficiency anemia, FOBT + stool           MEDICATIONS:   Fentanyl 50 mcg IV, Versed 6 mg IV     TOPICAL ANESTHETIC:  Cetacaine Spray           DESCRIPTION OF PROCEDURE:   After the risks benefits and     alternatives of the procedure were thoroughly explained, informed     consent was obtained.  The [id number] ([id number]) endoscope was     introduced through the mouth and advanced to the third portion of     the duodenum, without limitations.  The instrument was slowly     withdrawn as the mucosa was fully examined.     <<PROCEDUREIMAGES>>           The upper, middle, and distal third of the esophagus were     carefully inspected and no abnormalities were noted. The z-line     was well seen at the GEJ. The endoscope was pushed into the fundus     which was normal including a retroflexed view. The cardia,     fundus,gastric body, first and second part of the duodenum were     unremarkable.  A few tiny eerosions were found in the antrum.  No     active bleeding or blood seen.    Retroflexed views revealed no     abnormalities. Bx of D2/D3 taken to r/o sprue. The scope was then     withdrawn from the patient and the procedure completed.           COMPLICATIONS:  None           ENDOSCOPIC IMPRESSION:     1) Essentially Normal EGD     2) Tiny Erosions in the antrum could lead to  heme + stool but     unlikely to explain 4-5gm Hg drop from baseline.     3) No active bleeding or blood seen today           RECOMMENDATIONS:     1) await biopsy results     2) Continue iron therapy     3) Follow up with Dr Melvia Heaps in 2-3 weeks (May need repear     Capsule endoscopy)           ______________________________     Wilhemina Bonito. Eda Keys, MD           CC:  Madelin Headings, MD, Melvia Heaps, MD, The Patient  n.     eSIGNED:   Wilhemina Bonito. Eda Keys at 04/12/2009 11:24 AM           Raechel Chute, [phone]  Note: An exclamation mark (!) indicates a result that was not dispersed into the flowsheet. Document Creation Date: 04/12/2009 2:06 PM _______________________________________________________________________  (1) Order result status: Final Collection or observation date-time: 04/12/2009 11:05 Requested date-time:  Receipt date-time:  Reported date-time:  Referring Physician:   Ordering Physician: Fransico Setters [phone]) Specimen Source:  Source: Launa Grill Order Number: [id number] Lab site:

## 2010-05-25 NOTE — Assessment & Plan Note (Signed)
Summary: emp pt fasting/mhf   Vital Signs:  Patient Profile:   70 Years Old Female Height:     68.5 inches Weight:      154 pounds BMI:     23.16 Pulse rate:   60 / minute BP sitting:   110 / 70  (right arm) Cuff size:   regular  Vitals Entered By: Romualdo Bolk, CMA (May 19, 2008 9:11 AM)  Menstrual History: LMP - Character: 2008 Menarche: 12                 Chief Complaint:  Annual visit for disease management.  History of Present Illness: Belinda Harper is here for a 1 year rov. Pt to discuss doing a pap today. Current Problems:  UNSPECIFIED VITAMIN D DEFICIENCY (ICD-268.9) OSTEOPOROSIS (ICD-733.00) no fractures  OSTEOARTHRITIS (ICD-715.90)   DEPRESSION (ICD-311)   tends to get irritable    haveing to take care or  70 YO stressed because of hom esituation.  no anhedonia  .   ? if meds will help not suicidal.   enjoys outside activities.  ASTHMA (ICD-493.90) stable  ANEMIA-IRON DEFICIENCY (ICD-280.9)  on iron 3 x per day with vit c .    ALLERGIC RHINITIS (ICD-477.9)  no problems now  GYNE  had bx and eval dr cousins last year for bleeding        Prior Medications Reviewed Using: Patient Recall  Prior Medication List:  ACTONEL 35 MG TABS (RISEDRONATE SODIUM) 1 by mouth every week FOLIC ACID 1 MG TABS (FOLIC ACID) 1 by mouth once daily FERGON 240 MG  TABS (FERROUS GLUCONATE)  VITAMIN C 250 MG  TABS (ASCORBIC ACID)  ONE-A-DAY EXTRAS ANTIOXIDANT   CAPS (MULTIPLE VITAMINS-MINERALS)  VITAMIN D 400 UNIT  CAPS (CHOLECALCIFEROL)    Current Allergies (reviewed today): SULFAMETHOXAZOLE (SULFAMETHOXAZOLE)  Past Medical History:    Arthritis    Allergies    Allergic rhinitis    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 Smith Robert Odogwu- Oncology  has had bone marrow bx in the past.    [facility]- GI    Dr. Janey Greaser consult  for endometrial  bx and bleed 2009 neg               LAST    Mammogram: 04/02/08    Pap: 03/2007    Td: May 19, 2008    Colonscopy: 2008    EKG: 04/06/07    Dexa: 03/28/2007    Eye Exam: 10/09    Other:    Smoking: Never   Family History:    Family History of Arthritis    Family History High cholesterol    Family History Osteoporosis    Family History Ovarian cancer    Family History Uterine cancer    Family History of Cardiovascular disorder father and Mom around 74's     Father:  Heart disorder, arthritis, melanomia    Mother: Heart Disorder, high cholesterol, ovarian and uterine cancer, osteoprosis, Arthritis    Siblings: Died at age 80   Social History:    Occupation: retired Building surveyor    Never Smoked    Alcohol use-no    Drug use-no    Regular exercise-yes    Divorced    HHof 4     2 teens.    at home  Risk Factors:  Mammogram History:     Date of Last Mammogram:  04/02/2008    Results:  Normal Bilateral   PAP Smear History:     Date of Last PAP Smear:  04/15/2007    Results:  Normal   Colonoscopy History:     Date of Last Colonoscopy:  04/26/2003    Results:  Normal    Review of Systems  The patient denies anorexia, fever, weight loss, chest pain, syncope, dyspnea on exertion, prolonged cough, abdominal pain, melena, hematochezia, severe indigestion/heartburn, difficulty walking, enlarged lymph nodes, and angioedema.         hemorrhoid bleeding  a bunch no pain      comes in spells no blood in stool.   Physical Exam  General:     Well-developed,well-nourished,in no acute distress; alert,appropriate and cooperative throughout examination Head:     normocephalic and atraumatic.   Eyes:     vision grossly intact, pupils equal, pupils round, and pupils reactive to light.   Ears:     R ear normal, L ear normal, and no external deformities.   Nose:     no external deformity and no nasal discharge.   Mouth:     pharynx pink and  moist and no erythema.   Neck:     supple, full ROM, and no masses.   Breasts:     No mass, nodules, thickening, tenderness, bulging, retraction, inflamation, nipple discharge or skin changes noted.   Lungs:     Normal respiratory effort, chest expands symmetrically. Lungs are clear to auscultation, no crackles or wheezes. Heart:     normal rate, regular rhythm, no murmur, no gallop, no rub, and no lifts.   Abdomen:     Bowel sounds positive,abdomen soft and non-tender without masses, organomegaly or hernias noted. Rectal:     hemorrhoids nt not bleeding  no rectal masses  Genitalia:     Pelvic Exam:        External: normal female genitalia without lesions or masses        Vagina: normal without  mass        Cervix: normal without masses        Adnexa: normal bimanual exam without masses or fullness        Uterus: normal by palpation        Pap smear: not performed Pulses:     present without delay Extremities:     no cce   Neurologic:     alert & oriented X3, gait normal, and DTRs symmetrical and normal.  non focal Skin:     turgor normal, color normal, no ecchymoses, no petechiae, and no purpura.   Cervical Nodes:     No lymphadenopathy noted Axillary Nodes:     no R axillary adenopathy and no L axillary adenopathy.   Inguinal Nodes:     no R inguinal adenopathy and no L inguinal adenopathy.   Psych:     Oriented X3, normally interactive, good eye contact, not anxious appearing, and not depressed appearing.      Impression & Recommendations:  Problem # 1:  ANEMIA-IRON DEFICIENCY (ICD-280.9) extensive eval   and tolerating daily iron and vit c  Her updated medication list for this problem includes:    Folic Acid 1 Mg Tabs (Folic acid) .[name]... 1 by mouth once daily    Fergon 240 Mg Tabs (Ferrous gluconate)  Orders: TLB-CBC Platelet - w/Differential (85025-CBCD) TLB-Ferritin (82728-FER) TLB-BMP (Basic Metabolic Panel-BMET) (80048-METABOL) Hgb: 12.2 (  04/06/2007)   Hct:  37.2 (04/06/2007)   RDW: 14.2 (04/06/2007)   MCV: 86.2 (04/06/2007)   MCHC: 32.9 (04/06/2007) Ferritin: 18.1 (04/06/2007) TSH: 0.85 (04/06/2007)   Problem # 2:  ADJ DISORDER WITH MIXED ANXIETY & DEPRESSED MOOD (ICD-309.28) Assessment: New disc and counseled  cana try samples of lexapro and follow up   Problem # 3:  HYPERCHOLESTEROLEMIA (ICD-272.0)  Orders: TLB-Lipid Panel (80061-LIPID)   Problem # 4:  OSTEOPOROSIS (ICD-733.00)  Her updated medication list for this problem includes:    Actonel 35 Mg Tabs (Risedronate sodium) .[name]... 1 by mouth every week    Vitamin D 400 Unit Caps (Cholecalciferol)    Calcium Carbonate Powd (Calcium carbonate)  Orders: TLB-TSH (Thyroid Stimulating Hormone) (84443-TSH) T-Vitamin D (25-Hydroxy) (16109-60454) TLB-Hepatic/Liver Function Pnl (80076-HEPATIC) TLB-BMP (Basic Metabolic Panel-BMET) (80048-METABOL) UA Dipstick w/o Micro (automated)  (81003)   Problem # 5:  UNSPECIFIED VITAMIN D DEFICIENCY (ICD-268.9) Assessment: Comment Only  Complete Medication List: 1)  Actonel 35 Mg Tabs (Risedronate sodium) .[name].. 1 by mouth every week 2)  Folic Acid 1 Mg Tabs (Folic acid) .[name].. 1 by mouth once daily 3)  Fergon 240 Mg Tabs (Ferrous gluconate) 4)  Vitamin C 250 Mg Tabs (Ascorbic acid) 5)  One-a-day Extras Antioxidant Caps (Multiple vitamins-minerals) 6)  Vitamin D 400 Unit Caps (Cholecalciferol) 7)  Calcium Carbonate Powd (Calcium carbonate) 8)  Lexapro 10 Mg Tabs (Escitalopram oxalate) .... Take 1/2 by mouth once daily for 1 week then can increase to 1 by mouth once daily  Colorectal Screening:  Colonoscopy Results:    Date of Exam: 04/26/2003    Results: Normal  PAP Screening:    Last PAP smear:  04/15/2007  PAP Smear Results:    Date of Exam:  04/15/2007    Results:  Normal  Mammogram Screening:    Last Mammogram:  04/02/2008  Mammogram Results:    Date of Exam:  04/02/2008    Results:  Normal Bilateral  Osteoporosis Risk  Assessment:  Risk Factors for Fracture or Low Bone Density:   Race (White or Asian):     yes   FH of Osteoporosis:     Family History Osteoporosis   Smoking status:       never  Bone Density (DEXA Scan) Results:    Date of Exam:  03/28/2007    Results:  Normal  Immunization & Chemoprophylaxis:    Tetanus vaccine: Td  (04/25/1997)   Patient Instructions: 1)  begin lexapro 2)  return office visit in 3 weeks or as needed 3)  Will be informed of lab results when available.     Laboratory Results   Urine Tests    Routine Urinalysis   Color: yellow Appearance: Clear Glucose: negative   (Normal Range: Negative) Bilirubin: negative   (Normal Range: Negative) Ketone: trace (5)   (Normal Range: Negative) Spec. Gravity: 1.025   (Normal Range: 1.003-1.035) Blood: trace-intact   (Normal Range: Negative) pH: 5.5   (Normal Range: 5.0-8.0) Protein: negative   (Normal Range: Negative) Urobilinogen: 0.2   (Normal Range: 0-1) Nitrite: negative   (Normal Range: Negative) Leukocyte Esterace: trace   (Normal Range: Negative)    Comments: Rita Ohara  May 19, 2008 1:17 PM

## 2010-05-25 NOTE — Progress Notes (Signed)
Summary: post menopausal bleeding?    Phone Note Call from Patient Call back at [facility] Phone [phone] Call back at cell [phone]   Caller: PATIENT TRIAGE MESSAGE Call For: [facility] Summary of Call: FEELS LIKE MAYBE AN INFECTION.  SEEMS TO BE HAVING A PERIOD AND HAS BEEN THROUGH MENOPAUSE FOR 18 YEARS  Initial call taken by: Roselle Locus,  April 25, 2007 10:31 AM  Follow-up for Phone Call        PT CALLED ON [facility]. POST MENSTRUAL. SHE IS HAVING  A PERIOD. SHE IS HAVING AN ITCH WITH BURNING. PLEASE ADVISE. Follow-up by: Warnell Forester,  April 27, 2007 12:20 PM  Additional Follow-up for Phone Call Additional follow up Details #1::        Pt. has had one week of menstrual bleeding. (not heavy).  Does not have a GYN, but says you had spoken to her about this.  Needs to see you, or a referrral to GYN.  Pt can call us back tomorrow as she will be out of the house in the am. Vaginal itching is better.  Has had no bleeding until now for 18 years.  No history of GYN problems.  Normal menopause.   Additional Follow-up by: Lynann Beaver CMA,  April 30, 2007 8:42 AM    Additional Follow-up for Phone Call Additional follow up Details #2::    recommend referral to gyn with copy of last labs and pap for post menopausal bleeding. Follow-up by: Madelin Headings MD,  April 30, 2007 9:50 PM  Additional Follow-up for Phone Call Additional follow up Details #3:: Details for Additional Follow-up Action Taken: Left message to call back. ..................................................................Marland KitchenRomualdo Bolk, CMA  May 01, 2007 1:48 PM  Pt called back and was given names of 2 GYN, Marcelle Overlie MD and Maxie Better MD.  Pt called and made an appt with Dr Cherly Hensen for 06/27/07.  Copy of latest labs and pap provided for pt at front desk for her to pick-up fo OV.  Additional Follow-up by: Sid Falcon LPN,  May 01, 2007 2:26 PM

## 2010-05-25 NOTE — Progress Notes (Signed)
Summary: [facility] Admission Note  [facility] Admission Note   Imported By: Maryln Gottron 05/23/2008 13:43:32  _____________________________________________________________________  External Attachment:    Type:   Image     Comment:   External Document

## 2010-05-25 NOTE — Assessment & Plan Note (Signed)
Summary: bleeding hemmorroids/ssc   Vital Signs:  Patient profile:   70 year old female Menstrual status:  postmenopausal Weight:      155 pounds O2 Sat:      99 % on Room air Pulse rate:   72 / minute Pulse (ortho):   66 / minute BP sitting:   130 / 80  (left arm) BP standing:   124 / 80 Cuff size:   regular  Vitals Entered By: Romualdo Bolk, CMA (AAMA) (January 27, 2010 1:47 PM)  O2 Flow:  Room air  Serial Vital Signs/Assessments:  Time      Position  BP       Pulse  Resp  Temp     By 2:02 PM   Lying RA  132/82   58                    Shannon S Cranford, CMA (AAMA) 2:02 PM   Sitting   126/80   61                    Shannon S Cranford, CMA (AAMA) 2:02 PM   Standing  124/80   66                    Shannon S Cranford, CMA (AAMA)  CC: Rectal bleeding hemmorroids off and on for a couple of months. Pt is also fatigue has been going on for a couple of months. Pt is having a problem with her rt middle toe.   History of Present Illness: Belinda Harper comes in today  for acute visit because of concern that she   has had fatigue and then having recurrent  hemorrhoidal bleeding . She has had this for some time but not as frequently  . they come out  with bearing down at times and she  replaces them. No pain . [name]    No abd pain sweats fevers or pain.  CO "  thick in chest " for months without cough or sob.  ? if anxiety or  other . No NVD and no weight loss.    taking iron without problem No constipation and no blood in stool except with  bleeding.    Also see ros   Preventive Screening-Counseling & Management  Alcohol-Tobacco     Alcohol drinks/day: <1     Alcohol type: wine     Smoking Status: never  Caffeine-Diet-Exercise     Caffeine use/day: two a week maybe more     Does Patient Exercise: yes     Type of exercise: walk  Current Medications (verified): 1)  Actonel 35 Mg Tabs (Risedronate Sodium) .[name].. 1 By Mouth Every Week 2)  Fergon 240 Mg  Tabs (Ferrous Gluconate) ....  3 Two Times A Day 3)  Vitamin C 250 Mg  Tabs (Ascorbic Acid) .... 3 Two Times A Day 4)  Vitamin D 1000 Unit Tabs (Cholecalciferol) .... Daily 5)  Calcium Carbonate   Powd (Calcium Carbonate) .... 1500mg  Daily 6)  Juice Plus  Allergies (verified): 1)  Sulfamethoxazole (Sulfamethoxazole)  Past History:  Past medical, surgical, family and social histories (including risk factors) reviewed, and no changes noted (except as noted below).  Past Medical History: Reviewed history from 06/22/2009 and no changes required. Arthritis Allergies Allergic rhinitis Anemia-iron deficiency requiring transfusion and continued iron therapy  hg 4    transfusion 4 units  12 2010 Asthma Depression Osteoarthritis Osteoporosis  dexa -3.o  hip 2006 Consults Dr. Smith Robert Odogwu- Oncology  has had bone marrow bx in the past.   [facility]- GI Dr. Janey Greaser consult for endometrial  bx and bleed 2009 neg   Gastritis 2002       LAST Mammogram: 04/02/08 Pap: 03/2007 Td: May 19, 2008 Colonscopy: 2008 EKG: 04/06/07 Dexa: 03/28/2007 Eye Exam: 10/09 Other: Smoking: Never Ankle Fracture  Past Surgical History: Panendoscopy small bowel bx Left trimalleolar ankle fracture dislocation Nov 2010 Lt. Ankle Surgery fracture after fall Niov 2010  Past History:  Care Management: Ophthalmology:[facility] Orthopedics: [facility] GI: [facility]  Family History: Reviewed history from 05/07/2009 and no changes required. Family History of Arthritis Family History High cholesterol Family History Osteoporosis Family History Ovarian cancer Family History Uterine cancer Family History of Cardiovascular disorder father and Mom around 98's  Father:  Heart disorder, arthritis, melanomia Mother: Heart Disorder, high cholesterol, ovarian and uterine cancer, osteoprosis, Arthritis Siblings: Died at age 74  No FH of Colon Cancer:  Social History: Reviewed history from 10/21/2008 and no changes required. Occupation:  retired Building surveyor Never Smoked Alcohol use-no Drug use-no Regular exercise-yes Divorced HHof 4     2 teens.    at home now  moved out     Review of Systems  The patient denies anorexia, fever, weight loss, vision loss, decreased hearing, hoarseness, syncope, dyspnea on exertion, prolonged cough, headaches, abdominal pain, melena, severe indigestion/heartburn, transient blindness, difficulty walking, depression, enlarged lymph nodes, and angioedema.         right shoulder still stiff and dec rom    doesnt want ssteroid shot but concerned about use  left toes numbness and metatarsal but no numbness in foot otherwise .  ankle left doing well after pin fixation last Novemeber  ? no dysphagia   Physical Exam  General:  Well-developed,well-nourished,in no acute distress; alert,appropriate and cooperative throughout examination Head:  normocephalic and atraumatic.   Eyes:  vision grossly intact.   Neck:  supple.   Lungs:  normal respiratory effort, no intercostal retractions, no accessory muscle use, and normal breath sounds.   Heart:  normal rate, regular rhythm, no murmur, no gallop, and no lifts.   Abdomen:  soft, non-tender, no hepatomegaly, and no splenomegaly.   Rectal:  hemorrhoids  that prolapse some  with bearing down and go back in no bleed  no mass obvious  Genitalia:  no vaginal discharge.   prolapse  urethra area .  Msk:  no joint warmth and no redness over joints.   Pulses:  pulses intact without delay   Extremities:  left foot  slight contrature 4th toe   bynion right foot  nails painted  some thickening.  Neurologic:  alert & oriented X3, strength normal in all extremities, and gait normal.   Skin:  turgor normal, color normal, no ecchymoses, and no petechiae.   Cervical Nodes:  No lymphadenopathy noted Psych:  Oriented X3, normally interactive, good eye contact, not anxious appearing, and not depressed appearing.     Impression & Recommendations:  Problem #  1:  HEMORRHOIDS (ICD-455.6) bleeding intermittently with some prolapsing.   for a while     consider intervention if persisting with her hx of iron defic anemia   Problem # 2:  ANEMIA-IRON DEFICIENCY (ICD-280.9) complete eval in the past .   no diagnosis  Her updated medication list for this problem includes:    Fergon 240 Mg Tabs (Ferrous gluconate) .Marland KitchenMarland KitchenMarland KitchenMarland Kitchen 3 two times a day  Orders: TLB-TSH (Thyroid Stimulating Hormone) (  84443-TSH) TLB-CBC Platelet - w/Differential (85025-CBCD) TLB-IBC Pnl (Iron/FE;Transferrin) (83550-IBC) TLB-Ferritin (82728-FER) T-CRP (C-Reactive Protein) (16109) T-Antinuclear Antib (ANA) (60454-09811) TLB-BMP (Basic Metabolic Panel-BMET) (80048-METABOL)  Problem # 3:  MALAISE AND FATIGUE (ICD-780.79) vague signs like thick in the chest but not sob and no chest pain or exercise intolerance .  unsure if anxiety or other .   exam unrevealing today  will fu    . consider further eval if continuing.  Orders: TLB-TSH (Thyroid Stimulating Hormone) (84443-TSH) TLB-CBC Platelet - w/Differential (85025-CBCD) T-CRP (C-Reactive Protein) (91478) T-Antinuclear Antib (ANA) (29562-13086) TLB-BMP (Basic Metabolic Panel-BMET) (80048-METABOL)  Problem # 4:  NUMBNESS (ICD-782.0) suspect local mechanical problem  of  left toes and metatarsal no other evidence of Peripheral neuropathy.     and predated her left ankle fracture.   other eval if persistent or  progressive    Orders: Podiatry Referral (Podiatry)  Problem # 5:  SHOULDER PAIN, RIGHT (ICD-719.41) ongoing     consults  Orders: Orthopedic Referral (Ortho)  Complete Medication List: 1)  Actonel 35 Mg Tabs (Risedronate sodium) .[name].. 1 by mouth every week 2)  Fergon 240 Mg Tabs (Ferrous gluconate) .... 3 two times a day 3)  Vitamin C 250 Mg Tabs (Ascorbic acid) .... 3 two times a day 4)  Vitamin D 1000 Unit Tabs (Cholecalciferol) .... Daily 5)  Calcium Carbonate Powd (Calcium carbonate) .... 1500mg  daily 6)  Juice Plus     Other Orders: Flu Vaccine 47yrs + MEDICARE PATIENTS (V7846) Administration Flu vaccine - MCR (G0008) Venipuncture (96295) Specimen Handling (28413)  Patient Instructions: 1)  You will be informed of lab results when available.  2)  will do ortho  and podiatry referral as we discussed. 3)  If  recurring hemorrhoidal bleeding we can do referral for this.  4)  ROV 3-4 weeks   Flu Vaccine Consent Questions     Do you have a history of severe allergic reactions to this vaccine? no    Any prior history of allergic reactions to egg and/or gelatin? no    Do you have a sensitivity to the preservative Thimersol? no    Do you have a past history of Guillan-Barre Syndrome? no    Do you currently have an acute febrile illness? no    Have you ever had a severe reaction to latex? no    Vaccine information given and explained to patient? yes    Are you currently pregnant? no    Lot Number:[id number]   Exp Date:10/23/2010   Site Given  Left Deltoid IMu Romualdo Bolk, CMA (AAMA)  January 27, 2010 1:51 PM

## 2010-05-25 NOTE — Progress Notes (Signed)
Summary: Cipro didn't help  Phone Note Call from Patient Call back at Home Phone [phone]   Caller: Patient Reason for Call: Insurance Question Summary of Call: Pt called and said that the cipro is not helping. [facility]. Pt is still having the burning and urgency. Initial call taken by: Romualdo Bolk, CMA (AAMA),  April 29, 2009 9:31 AM  Follow-up for Phone Call        macrobid 100 by mouth two times a day for 7 days call if not helping after 5 days or as needed. Follow-up by: Madelin Headings MD,  April 29, 2009 2:15 PM  Additional Follow-up for Phone Call Additional follow up Details #1::        Pt aware and rx called in. Additional Follow-up by: Romualdo Bolk, CMA (AAMA),  April 29, 2009 3:05 PM    New/Updated Medications: MACROBID 100 MG CAPS (NITROFURANTOIN MONOHYD MACRO) 1 by mouth two times a day for 7 days Prescriptions: MACROBID 100 MG CAPS (NITROFURANTOIN MONOHYD MACRO) 1 by mouth two times a day for 7 days  #14 x 0   Entered by:   Romualdo Bolk, CMA (AAMA)   Authorized by:   Madelin Headings MD   Signed by:   Romualdo Bolk, CMA (AAMA) on 04/29/2009   Method used:   Electronically to        [facility]* (retail)       [address] [place], [place]  [address]       Ph: [phone] or [phone]       Fax: [phone]   RxID:   [id number]

## 2010-05-25 NOTE — Assessment & Plan Note (Signed)
Summary: spot on rt ankle that looks like a scab/ssc   Vital Signs:  Patient profile:   70 year old female Menstrual status:  postmenopausal Weight:      157 pounds Temp:     98.2 degrees F oral Pulse rate:   66 / minute BP sitting:   120 / 80  (left arm) Cuff size:   regular  Vitals Entered By: Romualdo Bolk, CMA (November 07, 2008 3:29 PM) CC: Brown spot on rt ankle that has been there for 1 week. Pt thought it was a scab. She left it alone but skin started coming over it and trying to embed it. So she started digging at it. There is some redness that pt states was already there before she started digging at it. Pt did find a tick on her.   History of Present Illness: Kahlani Graber comes in today for sda for above problem. Doesnt remember bit or tick there although pulled a tick off other part of body.  continueing change and slight redness and some soreness and no discharge . She tired to get out the black spot.    still some there . wants it checked   Also weaning lexapro needs 10 mg so can split it.  NO new symptom   Preventive Screening-Counseling & Management  Alcohol-Tobacco     Alcohol drinks/day: <1     Alcohol type: wine     Smoking Status: never  Caffeine-Diet-Exercise     Caffeine use/day: two a week maybe more     Does Patient Exercise: yes     Type of exercise: walk  Current Medications (verified): 1)  Actonel 35 Mg Tabs (Risedronate Sodium) .[name].. 1 By Mouth Every Week 2)  Fergon 240 Mg  Tabs (Ferrous Gluconate) 3)  Vitamin C 250 Mg  Tabs (Ascorbic Acid) 4)  One-A-Day Extras Antioxidant   Caps (Multiple Vitamins-Minerals) 5)  Vitamin D 400 Unit  Caps (Cholecalciferol) 6)  Calcium Carbonate   Powd (Calcium Carbonate) 7)  Lexapro 10 Mg Tabs (Escitalopram Oxalate) .[name].. 1 By Mouth Once Daily 8)  Lexapro 20 Mg Tabs (Escitalopram Oxalate) .... 1/2 By Mouth Once Daily  Allergies (verified): 1)  Sulfamethoxazole (Sulfamethoxazole)  Past History:  Past medical,  surgical, family and social histories (including risk factors) reviewed, and no changes noted (except as noted below).  Past Medical History: Reviewed history from 06/11/2008 and no changes required. Arthritis Allergies Allergic rhinitis Anemia-iron deficiency requiring transfusion and continued iron therapy  Asthma Depression Osteoarthritis Osteoporosis  dexa -3.o hip 2006 Consults Dr. Smith Robert Odogwu- Oncology  has had bone marrow bx in the past. [facility]- GI Dr. Janey Greaser consult for endometrial  bx and bleed 2009 neg          LAST Mammogram: 04/02/08 Pap: 03/2007 Td: May 19, 2008 Colonscopy: 2008 EKG: 04/06/07 Dexa: 03/28/2007 Eye Exam: 10/09 Other: Smoking: Never  Past Surgical History: Reviewed history from 04/06/2007 and no changes required. Panendoscopy small bowel bx  Family History: Reviewed history from 05/19/2008 and no changes required. Family History of Arthritis Family History High cholesterol Family History Osteoporosis Family History Ovarian cancer Family History Uterine cancer Family History of Cardiovascular disorder father and Mom around 26's  Father:  Heart disorder, arthritis, melanomia Mother: Heart Disorder, high cholesterol, ovarian and uterine cancer, osteoprosis, Arthritis Siblings: Died at age 51   Social History: Reviewed history from 10/21/2008 and no changes required. Occupation: retired Building surveyor Never Smoked Alcohol use-no Drug use-no Regular exercise-yes Divorced HHof 4  2 teens.    at home now  moved out   Caffeine use/day:  two a week maybe more  Review of Systems  The patient denies anorexia, fever, weight loss, chest pain, syncope, dyspnea on exertion, prolonged cough, headaches, hemoptysis, melena, hematochezia, abnormal bleeding, enlarged lymph nodes, and angioedema.    Physical Exam  General:  Well-developed,well-nourished,in no acute distress; alert,appropriate and cooperative throughout  examination Extremities:  no clubbing cyanosis or edema  Skin:  above right ankle ther is a 2-3 mm irreg ulcer abrasion looking type lesion with center black brown area and 1-2 cm surrounding  redness and no streaking and no discharge .   After soaking  area explored with magnification and no fb seen > centra pinpoint necrosed tissue>  Cervical Nodes:  No lymphadenopathy noted Psych:  Normal eye contact, appropriate affect. Cognition appears normal.    Impression & Recommendations:  Problem # 1:  ? of INSECT BITE, INFECTED (ICD-919.5)  unsure but no fb seen    empiric treatment and local care expalined  Orders: Prescription Created Electronically [phone])  Problem # 2:  ADJ DISORDER WITH MIXED ANXIETY & DEPRESSED MOOD (ICD-309.28) Assessment: Improved disc about waning again and samples of 10 mg given  28  call if needs refill   Problem # 3:  ANEMIA-IRON DEFICIENCY (ICD-280.9) reviewed  and copy of labs given to patient Her updated medication list for this problem includes:    Fergon 240 Mg Tabs (Ferrous gluconate)  Complete Medication List: 1)  Actonel 35 Mg Tabs (Risedronate sodium) .[name].. 1 by mouth every week 2)  Fergon 240 Mg Tabs (Ferrous gluconate) 3)  Vitamin C 250 Mg Tabs (Ascorbic acid) 4)  One-a-day Extras Antioxidant Caps (Multiple vitamins-minerals) 5)  Vitamin D 400 Unit Caps (Cholecalciferol) 6)  Calcium Carbonate Powd (Calcium carbonate) 7)  Lexapro 10 Mg Tabs (Escitalopram oxalate) .... 1/2 to 1 by mouth once daily as directed 8)  Doxycycline Hyclate 100 Mg Caps (Doxycycline hyclate) .[name].. 1 by mouth two times a day  Patient Instructions: 1)  begin antibiotic 2)  antibiotic oitment to area and keep covered . I dont think  there is a foreign body but could have been a bite .  3)  call if not improving next week.  Prescriptions: DOXYCYCLINE HYCLATE 100 MG CAPS (DOXYCYCLINE HYCLATE) 1 by mouth two times a day  #20 x 0   Entered and Authorized by:   Madelin Headings MD    Signed by:   Madelin Headings MD on 11/07/2008   Method used:   Electronically to        [facility]* (retail)       [address] [place], [place]  [address]       Ph: [phone] or [phone]       Fax: [phone]   RxID:   [id number]

## 2010-05-25 NOTE — Progress Notes (Signed)
Summary: refill  Phone Note From Pharmacy Call back at [phone]   Caller: [facility] [employer] For: Belinda Harper  Summary of Call: Folic Acid 1 MG Tab # 34 Sig take 1 each day  last filled 12/14/06 Initial call taken by: Roselle Locus,  January 24, 2007 3:50 PM  Follow-up for Phone Call        Sent thru EMR. Follow-up by: Romualdo Bolk, CMA,  January 24, 2007 3:58 PM    New/Updated Medications: FOLIC ACID 1 MG TABS (FOLIC ACID) 1 by mouth once daily   Prescriptions: FOLIC ACID 1 MG TABS (FOLIC ACID) 1 by mouth once daily  #34 x 11   Entered by:   Romualdo Bolk, CMA   Authorized by:   Madelin Headings MD   Signed by:   Romualdo Bolk, CMA on 01/24/2007   Method used:   Print then Give to Patient   RxID:   [id number]

## 2010-05-25 NOTE — Procedures (Signed)
Summary: EGD with biopsy/[facility]  EGD with biopsy/[facility]   Imported By: Maryln Gottron 05/01/2009 12:39:03  _____________________________________________________________________  External Attachment:    Type:   Image     Comment:   External Document

## 2010-05-25 NOTE — Assessment & Plan Note (Signed)
Summary: pt will come in fasting/njr   Vital Signs:  Patient Profile:   70 Years Old Female Height:     68.5 inches Weight:      156 pounds Pulse rate:   72 / minute BP sitting:   100 / 60  (left arm) Cuff size:   regular  Vitals Entered By: Romualdo Bolk, CMA (April 06, 2007 8:37 AM)                 Chief Complaint:  Preventive Care-Pt to discuss doing pap.  History of Present Illness: Belinda Harper is here for a preventive exam. Pt is fasting for labs. New medicare. Still worries about a tightness in chest with hoarseness at times without trigger. No exercise tolerance change.  Both parents had bypass surgery. ? should she be on asa now that GI and anemia issue is stable  Mood ok and didn't take Paxil.   With constipation ? bladder or uterus.   Last pap 2 years.   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     Asthma    Depression    Osteoarthritis    Osteoporosis  dexa -3.o hip 2006  Past Surgical History:    Panendoscopy small bowel bx   Family History:    Family History of Arthritis    Family History High cholesterol    Family History Osteoporosis    Family History Ovarian cancer    Family History Uterine cancer    Family History of Cardiovascular disorder father and Mom around 74's   Social History:    Occupation: retired Building surveyor    Never Smoked    Alcohol use-no    Drug use-no    Regular exercise-yes    Divorced   Risk Factors: Tobacco use:  never Drug use:  no Alcohol use:  no Exercise:  yes  Mammogram History:     Date of Last Mammogram:  03/28/2007    Results:  Normal Bilateral   PAP Smear History:     Date of Last PAP Smear:  03/29/2005    Results:  Normal    Review of Systems  The patient denies anorexia, fever, weight loss, vision loss, syncope, dyspnea  on exhertion, prolonged cough, abdominal pain, hematochezia, and incontinence.     Physical Exam  General:     Well-developed,well-nourished,in no acute distress; alert,appropriate and cooperative throughout examination Head:     Normocephalic and atraumatic without obvious abnormalities. No apparent alopecia or balding. Eyes:     vision grossly intact, pupils equal, pupils round, and pupils reactive to light.   Ears:     R ear normal and L ear normal.   Nose:     no nasal discharge.   Mouth:     good dentition and pharynx pink and moist.   Abdomen:     Bowel sounds positive,abdomen soft and non-tender without masses, organomegaly or hernias noted. Rectal:     external hemorrhoid(s).  no masses Genitalia:     Pelvic Exam:        External: normal female genitalia without lesions or masses        Vagina: normal without lesions or masses        Cervix: normal without lesions or masses        Adnexa: normal bimanual exam without masses or fullness  Uterus: normal by palpation  plrolapses with straining to introitus .         Pap smear: performed Msk:     no joint swelling, no joint warmth, no redness over joints, and no joint instability.   Pulses:     R and L carotid,radial,femoral,dorsalis pedis and posterior tibial pulses are full and equal bilaterally Extremities:     trace left pedal edema and trace right pedal edema.  varicose viens   Neurologic:     No cranial nerve deficits noted. Station and gait are normal. Plantar reflexes are down-going bilaterally. DTRs are symmetrical throughout. Sensory, motor and coordinative functions appear intact. Skin:     Intact without suspicious lesions or rashes Cervical Nodes:     No lymphadenopathy noted Axillary Nodes:     No palpable lymphadenopathy Inguinal Nodes:     No significant adenopathy Psych:     Cognition and judgment appear intact. Alert and cooperative with normal attention span and concentration. No apparent  delusions, illusions, hallucinations Additional Exam:     ekg NSR    Impression & Recommendations:  Problem # 1:  PREVENTIVE HEALTH CARE (ICD-V70.0) discussion Orders: EKG w/ Interpretation (93000)   Problem # 2:  ROUTINE GYNECOLOGICAL EXAMINATION (ICD-V72.31)  Orders: Pelvic & Breast Exam ( Medicare)  (B1478)   Problem # 3:  CHEST DISCOMFORT (ICD-786.59) atypical but has fam hx of cad both parents in her 82's   rec  myoview stress test. Orders: EKG w/ Interpretation (93000) Cardiology Referral (Cardiology)   Problem # 4:  UTERINE PROLAPSE (ICD-618.1)  Problem # 5:  OSTEOPOROSIS (ICD-733.00)  Her updated medication list for this problem includes:    Actonel 35 Mg Tabs (Risedronate sodium) .[name]... 1 by mouth every week    Vitamin D 400 Unit Caps (Cholecalciferol)   Problem # 6:  OSTEOARTHRITIS (ICD-715.90) stable  Problem # 7:  DEPRESSION (ICD-311) better  Problem # 8:  ANEMIA-IRON DEFICIENCY (ICD-280.9)  Her updated medication list for this problem includes:    Folic Acid 1 Mg Tabs (Folic acid) .[name]... 1 by mouth once daily    Fergon 240 Mg Tabs (Ferrous gluconate)  Orders: Venipuncture (29562) TLB-CBC Platelet - w/Differential (85025-CBCD) TLB-Ferritin (82728-FER) Hgb: 11.2 (09/20/2006)   Hct: 33.3 (09/20/2006)   RDW: 15.7 (09/20/2006)   MCV: 85.9 (09/20/2006)   MCHC: 33.7 (09/20/2006) Ferritin: 17.4 (09/20/2006) TSH: 1.51 (03/24/2006)   Complete Medication List: 1)  Actonel 35 Mg Tabs (Risedronate sodium) .[name].. 1 by mouth every week 2)  Folic Acid 1 Mg Tabs (Folic acid) .[name].. 1 by mouth once daily 3)  Fergon 240 Mg Tabs (Ferrous gluconate) 4)  Vitamin C 250 Mg Tabs (Ascorbic acid) 5)  One-a-day Extras Antioxidant Caps (Multiple vitamins-minerals) 6)  Vitamin D 400 Unit Caps (Cholecalciferol)  Other Orders: UA Dipstick w/o Micro (81002) TLB-Lipid Panel (80061-LIPID) TLB-BMP (Basic Metabolic Panel-BMET) (80048-METABOL) TLB-Hepatic/Liver Function Pnl  (80076-HEPATIC) TLB-TSH (Thyroid Stimulating Hormone) (84443-TSH)  PAP Screening:    Last PAP smear:  03/29/2005  PAP Smear Results:    Date of Exam:  03/29/2005    Results:  Normal  Mammogram Screening:    Last Mammogram:  03/28/2007  Mammogram Results:    Date of Exam:  03/28/2007    Results:  Normal Bilateral  Osteoporosis Risk Assessment:  Risk Factors for Fracture or Low Bone Density:   Race (White or Asian):     yes   FH of Osteoporosis:     Family History Osteoporosis   Smoking status:  never  Bone Density (DEXA Scan) Results:    Date of Exam:  03/28/2007    Results:  Normal  Immunization & Chemoprophylaxis:    Tetanus vaccine: Td  (04/25/1997)   Patient Instructions: 1)  call if wishes to do gyne  consult about prolapse.  2)  plan follow up depending on lab and myoview    ]  Tetanus/Td Immunization History:    Tetanus/Td # 1:  Td (04/25/1997)   Laboratory Results   Urine Tests   Date/Time Reported: April 06, 2007 10:04 AM   Routine Urinalysis   Color: yellow Appearance: Clear Glucose: negative   (Normal Range: Negative) Bilirubin: negative   (Normal Range: Negative) Ketone: negative   (Normal Range: Negative) Spec. Gravity: 1.020   (Normal Range: 1.003-1.035) Blood: trace-lysed   (Normal Range: Negative) pH: 7.0   (Normal Range: 5.0-8.0) Protein: negative   (Normal Range: Negative) Urobilinogen: 0.2   (Normal Range: 0-1) Nitrite: negative   (Normal Range: Negative) Leukocyte Esterace: negative   (Normal Range: Negative)    Comments: ..................................................................Marland KitchenWynona Canes, CMA  April 06, 2007 10:04 AM

## 2010-05-25 NOTE — Progress Notes (Signed)
Summary: RX request for Hemmoroids  Phone Note Call from Patient   Caller: Patient Call For: Madelin Headings MD Summary of Call: Pt has hemorrhoids, and cannot get to the office.  Has used , preparation H and witch hazel,   Would like Rx called to pharmacy. Initial call taken by: Lynann Beaver CMA,  March 27, 2009 3:28 PM  Follow-up for Phone Call        Anusol  HC  supp  1 pr two times a day  for up to 2 weeks . Disp 28 . Follow-up by: Madelin Headings MD,  March 27, 2009 4:55 PM    New/Updated Medications: ANUSOL-HC 25 MG SUPP (HYDROCORTISONE ACETATE) insert rectally two times a day x 2 weeks. Prescriptions: ANUSOL-HC 25 MG SUPP (HYDROCORTISONE ACETATE) insert rectally two times a day x 2 weeks.  #28 x 0   Entered by:   Lynann Beaver CMA   Authorized by:   Madelin Headings MD   Signed by:   Lynann Beaver CMA on 03/27/2009   Method used:   Electronically to        [facility]* (retail)       [address] [place], [place]  [address]       Ph: [phone] or [phone]       Fax: [phone]   RxID:   [id number]  Pt notified.

## 2010-05-25 NOTE — Assessment & Plan Note (Signed)
Summary: ROA/FUP/RCD   Vital Signs:  Patient Profile:   70 Years Old Female Height:     68.5 inches Weight:      154 pounds BP sitting:   110 / 74  (left arm) Cuff size:   regular  Vitals Entered By: Kern Reap CMA (June 11, 2008 2:41 PM)                 Chief Complaint:  follow up lexapro.  History of Present Illness: Belinda Harper comes in for  follow up meds.Belinda Harper   at times  now up to a whole   Still flies off the handle .    now has been on whole pill about  10 days of whole pill.     NOt as jittery.    follow up anemia only taking once a day now . no bleeding.    Updated Prior Medication List: ACTONEL 35 MG TABS (RISEDRONATE SODIUM) 1 by mouth every week FOLIC ACID 1 MG TABS (FOLIC ACID) 1 by mouth once daily FERGON 240 MG  TABS (FERROUS GLUCONATE)  VITAMIN C 250 MG  TABS (ASCORBIC ACID)  ONE-A-DAY EXTRAS ANTIOXIDANT   CAPS (MULTIPLE VITAMINS-MINERALS)  VITAMIN D 400 UNIT  CAPS (CHOLECALCIFEROL)  CALCIUM CARBONATE   POWD (CALCIUM CARBONATE)  LEXAPRO 10 MG TABS (ESCITALOPRAM OXALATE) take 1/2 by mouth once daily for 1 week then can increase to 1 by mouth once daily  Current Allergies (reviewed today): SULFAMETHOXAZOLE (SULFAMETHOXAZOLE)  Past Medical History:    Arthritis    Allergies    Allergic rhinitis    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 Smith Robert Odogwu- Oncology  has had bone marrow bx in the past.    [facility]- GI    Dr. Janey Greaser consult for endometrial  bx and bleed 2009 neg                LAST    Mammogram: 04/02/08    Pap: 03/2007    Td: May 19, 2008    Colonscopy: 2008    EKG: 04/06/07    Dexa: 03/28/2007    Eye Exam: 10/09    Other:    Smoking: Never      Physical Exam  General:     Well-developed,well-nourished,in no acute distress; alert,appropriate and cooperative  throughout examination Additional Exam:     see labs     Impression & Recommendations:  Problem # 1:  ADJ DISORDER WITH MIXED ANXIETY & DEPRESSED MOOD (ICD-309.28) ? if effect of med  situational issues  that may improve soon .  Problem # 2:  ANEMIA-IRON DEFICIENCY (ICD-280.9) Assessment: Deteriorated increase dosing  has had extensive eval in past  Her updated medication list for this problem includes:    Folic Acid 1 Mg Tabs (Folic acid) .[name]... 1 by mouth once daily    Fergon 240 Mg Tabs (Ferrous gluconate)  Orders: Hgb (11914)   Complete Medication List: 1)  Actonel 35 Mg Tabs (Risedronate sodium) .[name].. 1 by mouth every week 2)  Folic Acid 1 Mg Tabs (Folic acid) .[name].. 1 by mouth once daily 3)  Fergon 240 Mg Tabs (Ferrous gluconate) 4)  Vitamin C 250 Mg Tabs (Ascorbic acid) 5)  One-a-day Extras Antioxidant Caps (Multiple vitamins-minerals) 6)  Vitamin D 400 Unit Caps (Cholecalciferol)  7)  Calcium Carbonate Powd (Calcium carbonate) 8)  Lexapro 10 Mg Tabs (Escitalopram oxalate) .... Take 1/2 by mouth once daily for 1 week then can increase to 1 by mouth once daily   Patient Instructions: 1)  increase iron to two times a day  2)  continue lexapro for another 2 weeks and then call and we will decide 3)  rov in 3 mos otherwise .   Prescriptions: FOLIC ACID 1 MG TABS (FOLIC ACID) 1 by mouth once daily  #30 x 3   Entered by:   Kern Reap CMA   Authorized by:   Madelin Headings MD   Signed by:   Kern Reap CMA on 06/11/2008   Method used:   Electronically to        [facility]* (retail)       [address] [place], [place]  [address]       Ph: [phone] or [phone]       Fax: [phone]   RxID:   [id number] ACTONEL 35 MG TABS (RISEDRONATE SODIUM) 1 by mouth every week  #4 x 11   Entered by:   Kern Reap CMA   Authorized by:   Madelin Headings MD   Signed by:   Kern Reap CMA on 06/11/2008    Method used:   Electronically to        [facility]* (mail-order)             ,          Ph: [phone]       Fax: [phone]   RxID:   [id number]   Laboratory Results   CBC   HGB:  11.4 g/dL   (Normal Range: 60.6-30.1 in Males, 12.0-15.0 in Females) Comments: Rita Ohara  June 11, 2008 3:13 PM

## 2010-05-25 NOTE — Letter (Signed)
Summary: Patient [facility] Biopsy Results  [facility]  [address] [place], [place] [address]   Phone: [phone]  Fax: [phone]        April 14, 2009 MRN: [id number]    [facility] [address] [place], [place]  [address]    Dear Ms. Hodak,  I am pleased to inform you that the biopsies taken during your recent endoscopic examination were normal and did not show any evidence of sprue or an iron absorption problem.  Additional information/recommendations:   __ Please call [phone] to schedule a return visit With your primary gastroenterologist, Dr. Melvia Heaps, to review your condition.   __ Continue with the treatment plan as outlined on the day of your      exam.     Please call us if you are having persistent problems or have questions about your condition that have not been fully answered at this time.  Sincerely,  Hilarie Fredrickson MD  This letter has been electronically signed by your physician.  Appended Document: Patient Notice-Endo Biopsy Results Letter mailed

## 2010-05-25 NOTE — Progress Notes (Signed)
Summary: needs order for mammogram  Phone Note Call from Patient Call back at Home Phone [phone]   Caller: Patient Call For: panosh Summary of Call: called month ago requesting a mammogram  order for [facility]  Initial call taken by: Roselle Locus,  February 27, 2007 3:01 PM  Follow-up for Phone Call        Left message to call back. We done the Dexa order last month but not the mmg. Follow-up by: Romualdo Bolk, CMA,  February 27, 2007 4:05 PM  Additional Follow-up for Phone Call Additional follow up Details #1::        Left message to call back. Additional Follow-up by: Romualdo Bolk, CMA,  March 21, 2007 4:12 PM    Additional Follow-up for Phone Call Additional follow up Details #2::    pt callback cell [phone] Follow-up by: Heron Sabins,  March 26, 2007 10:00 AM  Additional Follow-up for Phone Call Additional follow up Details #3:: Details for Additional Follow-up Action Taken: Faxed over order again for Dexa. Additional Follow-up by: Romualdo Bolk, CMA,  March 28, 2007 3:33 PM

## 2010-05-25 NOTE — Procedures (Signed)
Summary: EGD   EGD  Procedure date:  02/02/2002  Findings:      Findings: Gastritis  Location: [facility]    EGD  Procedure date:  02/02/2002  Findings:      Findings: Gastritis  Location: [facility]   Patient Name: Belinda Harper, Belinda Harper MRN:  Procedure Procedures: Panendoscopy (EGD) CPT: 43235.    with biopsy(s)/brushing(s). CPT: D1846139.  Personnel: Endoscopist: Iva Boop, MD, [facility].  Exam Location: Exam performed in Endoscopy Suite. Inpatient-ward  Patient Consent: Procedure, Alternatives, Risks and Benefits discussed, consent obtained,  Indications  Evaluation of: Anemia,  with low ferritin. with low iron saturation. Microcytic. Positive fecal occult blood test per digital rectal exam.  History Comments: Recurrent iron deficiency anemia (worse) despite iron supplementation. Heme + stool. Pre-Exam Physical: Performed Feb 02, 2002  Cardio-pulmonary exam abnormal. HEENT exam, Abdominal exam, Mental status exam WNL. Abnormal PE findings include: 3/6 systolic murmur.  Comments: Hgb  4.7, Ferritin 4 Exam Exam Info: Maximum depth of insertion Duodenum, intended Duodenum. Patient position: on left side. Vocal cords not visualized. Gastric retroflexion performed. Images taken. ASA Classification: I. Tolerance: excellent.  Sedation Meds: Versed 5 mg. given IV. Fentanyl 25 mcg. given IV. Cetacaine Spray 2 sprays given aerosolized.  Monitoring: BP and pulse monitoring done. Oximetry used. Supplemental O2 given  Fluoroscopy: Fluoroscopy was not used.  Findings - Normal: Proximal Esophagus to Body.  - Normal: Pyloric Sphincter to Duodenal 2nd Portion. Biopsy/Normal taken. Comments: Duodenal biopsies taken to exclude sprue.  MUCOSAL ABNORMALITY: Antrum. Erythematous mucosa. Mottled mucosa. ICD9: Gastritis, Unspecified: 535.50. Comment: Not biopsied as prior biopsies unrevealing. .   Assessment Abnormal examination, see findings above.    Diagnoses: 535.50: Gastritis, Unspecified.   Events  Unplanned Intervention: No unplanned interventions were required.  Plans Medication(s): Await pathology.  Comments: Continue iron supplememtation, transfusion. May require parenteral iron at some point. Disposition: After procedure patient sent to recovery.  Comments: If biopsies unhelpful would probably do small bowel capsule study vs repeat colonoscopy. Will notify the patient. This report was created from the original endoscopy report, which was reviewed and signed by the above listed endoscopist.    cc. Mikey Bussing

## 2010-05-25 NOTE — Procedures (Signed)
Summary: EGD   EGD  Procedure date:  03/20/2001  Findings:      Findings: Gastritis  Location: [facility]    EGD  Procedure date:  03/20/2001  Findings:      Findings: Gastritis  Location: [facility]   Patient Name: Belinda Harper, Belinda Harper MRN:  Procedure Procedures: Panendoscopy (EGD) CPT: 43235.    with biopsy(s)/brushing(s). CPT: D1846139.  Personnel: Endoscopist: [name], MD.  Referred By: Berniece Andreas, MD.  Exam Location: Exam performed in Outpatient Clinic. Outpatient  Patient Consent: Procedure, Alternatives, Risks and Benefits discussed, consent obtained, from patient.  Indications  Evaluation of: Anemia,  with low ferritin. with low iron saturation.  History  Pre-Exam Physical: Performed Mar 20, 2001  Cardio-pulmonary exam, HEENT exam, Abdominal exam, Extremity exam, Neurological exam, Mental status exam WNL.  Exam Exam Info: Maximum depth of insertion Duodenum, intended Duodenum. ASA Classification: II. Tolerance: excellent.  Sedation Meds: Patient assessed and found to be appropriate for moderate (conscious) sedation. Fentanyl 50 mcg. Versed 5 mg. Cetacaine Spray 1 sprays  Monitoring: BP and pulse monitoring done. Oximetry used. Supplemental O2 given  Findings - MUCOSAL ABNORMALITY: Body to Antrum. Erosions present. Erythematous mucosa. Biopsy/Mucosal Abn taken. ICD9: Gastritis, Acute: 535.00. Comment: Streaky erythema with few erosions.   Assessment Abnormal examination, see findings above.  Diagnoses: 535.00: Gastritis, Acute.   Events  Unplanned Intervention: No unplanned interventions were required.  Unplanned Events: There were no complications. Plans Medication(s): H2Blocker: Cimetidine/Tagamet 400 mg BID, starting Mar 20, 2001   Disposition: After procedure patient sent to recovery. After recovery patient sent home.  Scheduling: UGI/small bowel follow through, around Mar 23, 2001.  Home stool  hemocults, around Mar 23, 2001.  Office Visit, to [employer]. Arlyce Dice, MD, around Apr 10, 2001.    This report was created from the original endoscopy report, which was reviewed and signed by the above listed endoscopist.    cc. Burna Mortimer Panosh,MD

## 2010-05-25 NOTE — Assessment & Plan Note (Signed)
Summary: post hospital f/u/ssc   Vital Signs:  Patient profile:   70 year old female Menstrual status:  postmenopausal Temp:     98.4 degrees F oral Pulse rate:   98 / minute BP sitting:   120 / 80  (left arm) Cuff size:   regular  Vitals Entered By: Romualdo Bolk, CMA (AAMA) (April 10, 2009 10:29 AM) CC: Post Hospital Follow Up   History of Present Illness: Belinda Harper comesin today for  for above.  She was hospitalized for an injury  malleolar fracture that required orif  Nov 26- 30 .  per Dr Despina Hick . She has been at home since then OT x 1 and PT    once per week.  Using rolling walker .   we have no record except the op note and labs in [place].  See phone note . She is no longer taking pain meds  .last  check with Dr Despina Hick 10 days.   due jan 6th.  Some  concern about directions from PT About not putting weight on it.  Sometimes take boot off . No weight bearing.  Using ice water  baths.   She has had near fainting lightheaded episoeds at time that is better today  Since changing order of activity and lightheadedness getting better. Eating now before shower and feels some better  . She states she   was getting lightheaded  when going in shower.       Bleeding hemorrhoids  at hosp  and continuing uncomfortableto sit on  but  getting better.  Denies other bleeding. Is  now on asa once daily since hospt to prevent clotting.  Also was rx for  uti in hospital  and on cipro  x 3 (  ? 9 days ) apparently urine cuture neg but had burning and dysuria. Never had fever back pain hematuria. .      Preventive Screening-Counseling & Management  Alcohol-Tobacco     Alcohol drinks/day: <1     Alcohol type: wine     Smoking Status: never  Caffeine-Diet-Exercise     Caffeine use/day: two a week maybe more     Does Patient Exercise: yes     Type of exercise: walk      Blood Transfusions:  yes, after 2001, and for anemia.    Current Medications (verified): 1)  Actonel 35 Mg Tabs  (Risedronate Sodium) .[name].. 1 By Mouth Every Week 2)  Fergon 240 Mg  Tabs (Ferrous Gluconate) .... 3 Two Times A Day 3)  Vitamin C 250 Mg  Tabs (Ascorbic Acid) 4)  One-A-Day Extras Antioxidant   Caps (Multiple Vitamins-Minerals) 5)  Vitamin D 400 Unit  Caps (Cholecalciferol) 6)  Calcium Carbonate   Powd (Calcium Carbonate) 7)  Anusol-Hc 25 Mg Supp (Hydrocortisone Acetate) .... Insert Rectally Two Times A Day X 2 Weeks. 8)  Cipro 500 Mg Tabs (Ciprofloxacin Hcl) .[name].. 1 By Mouth Two Times A Day 9)  Methocarbamol 500 Mg Tabs (Methocarbamol) .[name].. 1 By Mouth Three Times A Day As Needed For Spasms  Allergies (verified): 1)  Sulfamethoxazole (Sulfamethoxazole)  Past History:  Past medical, surgical, family and social histories (including risk factors) reviewed, and no changes noted (except as noted below).  Past Medical History: Reviewed history from 06/11/2008 and no changes required. Arthritis Allergies Allergic rhinitis Anemia-iron deficiency requiring transfusion and continued iron therapy  Asthma Depression Osteoarthritis Osteoporosis  dexa -3.o hip 2006 Consults Dr. Smith Robert Odogwu- Oncology  has had bone marrow  bx in the past. [facility]- GI Dr. Janey Greaser consult for endometrial  bx and bleed 2009 neg          LAST Mammogram: 04/02/08 Pap: 03/2007 Td: May 19, 2008 Colonscopy: 2008 EKG: 04/06/07 Dexa: 03/28/2007 Eye Exam: 10/09 Other: Smoking: Never  Past Surgical History: Panendoscopy small bowel bx Left trimalleolar ankle fracture dislocation Nov 2010  Past History:  Care Management: Ophthalmology:[facility]: [facility]:  Family History: Reviewed history from 05/19/2008 and no changes required. Family History of Arthritis Family History High cholesterol Family History Osteoporosis Family History Ovarian cancer Family History Uterine cancer Family History of Cardiovascular disorder father and Mom around 70's  Father:  Heart disorder, arthritis,  melanomia Mother: Heart Disorder, high cholesterol, ovarian and uterine cancer, osteoprosis, Arthritis Siblings: Died at age 73   Social History: Reviewed history from 10/21/2008 and no changes required. Occupation: retired Building surveyor Never Smoked Alcohol use-no Drug use-no Regular exercise-yes Divorced HHof 4     2 teens.    at home now  moved out   Blood Transfusions:  yes, after 2001, for anemia  Review of Systems  The patient denies anorexia, fever, weight loss, weight gain, decreased hearing, chest pain, peripheral edema, prolonged cough, headaches, abdominal pain, melena, hematochezia, severe indigestion/heartburn, hematuria, muscle weakness, transient blindness, unusual weight change, enlarged lymph nodes, and angioedema.         no cp sob or palpitations or sweating  Physical Exam  General:  Well-developed,well-nourished,in no acute distress; alert,appropriate and cooperative throughout examination  nailbeds somewhat pale  cheeks rosy ? from makeup. non toxic apeaaring  in WC  Head:  normocephalic and atraumatic.   Eyes:  vision grossly intact, pupils equal, and pupils round.   Mouth:  pharynx pink and moist.   Neck:  No deformities, masses, or tenderness noted. Lungs:  Normal respiratory effort, chest expands symmetrically. Lungs are clear to auscultation, no crackles or wheezes.no dullness.   Heart:  regular rhythm, no gallop, no rub, no JVD, no HJR, and no lifts.  rate 88  faint 1/6 sem lusb  no radiation  Abdomen:  sitting no pain Pulses:  nl cap refill  nail beds pale  Extremities:  left foot in boot   right  no sig edema or ulcer s Skin:  turgor normal, no ecchymoses, no petechiae, and no purpura.   Cervical Nodes:  No lymphadenopathy noted Psych:  Oriented X3, normally interactive, good eye contact, not anxious appearing, and not depressed appearing.  nl mentation Additional Exam:  see e chart  hg 10 on discharge   ua no growth x 2    Impression &  Recommendations:  Problem # 1:  ORTHOSTATIC DIZZINESS (ICD-780.4) post op 2-3 weeks .     no discharge summary available.   check for  worsening anemia .    with hx of same  and has been off her iron and has " bleeding hemorroids"   also WD from lexapro but out a couple weeks and no need to restart .     Orders: UA Dipstick w/o Micro (automated)  (81003) Venipuncture (52841)  Problem # 2:  HEMORRHOIDS (ICD-455.6) hx of same  not check as she is in wheelchair today and being rx with  local rx .    Problem # 3:  DYSURIA (ICD-788.1)  confusing scenario     med didnt  help  that much and had refills of cipro x 2  .  better     ? if  improved .  still some signs   poss resistnt germ or other.  Her updated medication list for this problem includes:    Cipro 500 Mg Tabs (Ciprofloxacin hcl) .[name]... 1 by mouth two times a day  Problem # 4:  ANEMIA-IRON DEFICIENCY (ICD-280.9) hx of transfusion in past with ess neg eval and need for continued iron daily to maintain  Hct  .  concern about asa  Her updated medication list for this problem includes:    Fergon 240 Mg Tabs (Ferrous gluconate) .Marland KitchenMarland KitchenMarland KitchenMarland Kitchen 3 two times a day  Orders: UA Dipstick w/o Micro (automated)  (81003) Venipuncture (99371)  Complete Medication List: 1)  Actonel 35 Mg Tabs (Risedronate sodium) .[name].. 1 by mouth every week 2)  Fergon 240 Mg Tabs (Ferrous gluconate) .... 3 two times a day 3)  Vitamin C 250 Mg Tabs (Ascorbic acid) 4)  One-a-day Extras Antioxidant Caps (Multiple vitamins-minerals) 5)  Vitamin D 1000 Unit Tabs (Cholecalciferol) 6)  Calcium Carbonate Powd (Calcium carbonate) 7)  Anusol-hc 25 Mg Supp (Hydrocortisone acetate) .... Insert rectally two times a day x 2 weeks. 8)  Cipro 500 Mg Tabs (Ciprofloxacin hcl) .[name].. 1 by mouth two times a day 9)  Methocarbamol 500 Mg Tabs (Methocarbamol) .[name].. 1 by mouth three times a day as needed for spasms 10)  Aspirin 81 Mg Tabs (Aspirin) 11)  Stool Softener 100 Mg Caps (Docusate  sodium)  Other Orders: TLB-CBC Platelet - w/Differential (85025-CBCD) TLB-BMP (Basic Metabolic Panel-BMET) (80048-METABOL)  Patient Instructions: 1)  You will be informed of lab results when available.  2)  and urine results  3)  then plan follow up 4)  in the meantime   take iron. prolonged visit face to face and record retrieval and review and phone calls.  45 minutes  Laboratory Results   Urine Tests    Routine Urinalysis   Color: yellow Appearance: Clear Glucose: negative   (Normal Range: Negative) Bilirubin: negative   (Normal Range: Negative) Ketone: negative   (Normal Range: Negative) Spec. Gravity: 1.015   (Normal Range: 1.003-1.035) Blood: trace-intact   (Normal Range: Negative) pH: 6.0   (Normal Range: 5.0-8.0) Protein: 1+   (Normal Range: Negative) Urobilinogen: 0.2   (Normal Range: 0-1) Nitrite: positive   (Normal Range: Negative) Leukocyte Esterace: 2+   (Normal Range: Negative)    Comments: Rita Ohara  April 10, 2009 11:49 AM    Requested  to  culture if positive but dont think this happened.   After pt left lab called with critical value of hg 4.6  pt notified to go the hospital and disc situtaiton with Kenard Gower,  Dr [facility]'s  extender .   Madelin Headings MD  April 10, 2009 6:25 PM

## 2010-05-25 NOTE — Assessment & Plan Note (Signed)
Summary: emp/cjr   Vital Signs:  Patient profile:   70 year old female Menstrual status:  postmenopausal Height:      68 inches Weight:      152 pounds Temp:     98.5 degrees F oral Pulse rate:   66 / minute BP sitting:   110 / 70  (left arm) Cuff size:   regular  Vitals Entered By: Romualdo Bolk, CMA (AAMA) (June 22, 2009 9:29 AM) CC: Annual visit for disease management- Pt to discuss doing a pap   History of Present Illness: Kebra Lowrimore comesin today for   follow up of multiple medical problems  Due for labs. Since last visit has done well . no dizziness.   left le still in boot .  limits some acitivity Anemia: taking iron no bleeding off of asa. Osteoporosis : no more fracture OA no  new symptom  REsp no cp sob  Preventive Care Screening  T-score L femur:    Date:  05/13/2009    Results:  0.568   T-score L hip:    Date:  05/13/2009    Results:  0.724 SDs  T-score L-Spine:    Date:  05/13/2009    Results:  1.042   Bone Density:    Date:  05/13/2009    Results:  osteoporosis std dev  Mammogram:    Date:  05/13/2009    Results:  normal bilateral   Last Pneumovax:    Date:  04/25/2006    Results:  given   Prior Values:    Pap Smear:  Normal (04/15/2007)    Mammogram:  Normal Bilateral (04/02/2008)    Colonoscopy:  Normal (04/26/2003)    Bone Density:  Normal (03/28/2007)    Last Tetanus Booster:  Td (04/25/1997)    Last Flu Shot:  Fluvax 3+ (03/10/2009)    Dexa Interp:  Normal (03/28/2007)   Preventive Screening-Counseling & Management  Alcohol-Tobacco     Alcohol drinks/day: <1     Alcohol type: wine     Smoking Status: never  Caffeine-Diet-Exercise     Caffeine use/day: two a week maybe more     Does Patient Exercise: yes     Type of exercise: walk  Safety-Violence-Falls     Seat Belt Use: yes      Blood Transfusions:  yes and after 2001.    EKG  Procedure date:  04/06/2007  Findings:      Sinus bradycardia with rate of:   48  Current Medications (verified): 1)  Actonel 35 Mg Tabs (Risedronate Sodium) .[name].. 1 By Mouth Every Week 2)  Fergon 240 Mg  Tabs (Ferrous Gluconate) .... 3 Two Times A Day 3)  Vitamin C 250 Mg  Tabs (Ascorbic Acid) .... 3 Two Times A Day 4)  One-A-Day Extras Antioxidant   Caps (Multiple Vitamins-Minerals) 5)  Vitamin D 1000 Unit Tabs (Cholecalciferol) .... Daily 6)  Calcium Carbonate   Powd (Calcium Carbonate) .... 1500mg  Daily 7)  Stool Softener 100 Mg Caps (Docusate Sodium) .[name].. 1 Two Times A Day  Allergies (verified): 1)  Sulfamethoxazole (Sulfamethoxazole)  Past History:  Past medical, surgical, family and social histories (including risk factors) reviewed, and no changes noted (except as noted below).  Past Medical History: Arthritis Allergies Allergic rhinitis Anemia-iron deficiency requiring transfusion and continued iron therapy  hg 4    transfusion 4 units  12 2010 Asthma Depression Osteoarthritis Osteoporosis  dexa -3.o hip 2006 Consults Dr. Smith Robert Odogwu- Oncology  has had bone marrow  bx in the past.   [facility]- GI Dr. Janey Greaser consult for endometrial  bx and bleed 2009 neg   Gastritis 2002       LAST Mammogram: 04/02/08 Pap: 03/2007 Td: May 19, 2008 Colonscopy: 2008 EKG: 04/06/07 Dexa: 03/28/2007 Eye Exam: 10/09 Other: Smoking: Never Ankle Fracture  Past Surgical History: Reviewed history from 05/07/2009 and no changes required. Panendoscopy small bowel bx Left trimalleolar ankle fracture dislocation Nov 2010 Lt. Ankle Surgery  Past History:  Care Management: Ophthalmology:[facility]: [facility]: [facility]  Family History: Reviewed history from 05/07/2009 and no changes required. Family History of Arthritis Family History High cholesterol Family History Osteoporosis Family History Ovarian cancer Family History Uterine cancer Family History of Cardiovascular disorder father and Mom around 62's  Father:  Heart disorder,  arthritis, melanomia Mother: Heart Disorder, high cholesterol, ovarian and uterine cancer, osteoprosis, Arthritis Siblings: Died at age 39  No FH of Colon Cancer:  Social History: Reviewed history from 10/21/2008 and no changes required. Occupation: retired Building surveyor Never Smoked Alcohol use-no Drug use-no Regular exercise-yes Divorced HHof 4     2 teens.    at home now  moved out   [employer] Use:  yes Blood Transfusions:  yes, after 2001  Review of Systems  The patient denies anorexia, fever, weight loss, weight gain, vision loss, decreased hearing, chest pain, syncope, peripheral edema, prolonged cough, hemoptysis, melena, hematochezia, severe indigestion/heartburn, hematuria, transient blindness, abnormal bleeding, enlarged lymph nodes, angioedema, and breast masses.         hammer toe  .   Physical Exam General Appearance: well developed, well nourished, no acute distress Eyes: conjunctiva and lids normal, PERRLA, EOMI, fundi WNL Ears, Nose, Mouth, Throat: TM clear, nares clear, oral exam WNL Neck: supple, no lymphadenopathy, no thyromegaly, no JVD Respiratory: clear to auscultation and percussion, respiratory effort normal Cardiovascular: regular rate and rhythm, S1-S2, no murmur, rub or gallop, no bruits, peripheral pulses normal and symmetric, no cyanosis, clubbing, edema or varicosities Chest: no scars, masses, tenderness; no asymmetry, skin changes, nipple discharge   Gastrointestinal: soft, non-tender; no hepatosplenomegaly, masses; active bowel sounds all quadrants,  Lymphatic: no cervical, axillary or inguinal adenopathy Musculoskeletal: using  walker without problem  left le in boot  Skin: clear, good turgor, color WNL, no rashes, lesions, or ulcerations Neurologic: normal mental status, normal reflexes, normal strength, sensation, and motion Psychiatric: alert; oriented to person, place and time Other Exam:     Impression & Recommendations:  Problem #  1:  ANEMIA-IRON DEFICIENCY (ICD-280.9)  agree with seeing gi again.   with 2 episodes of sever anemia requiring transfusion  .  time to reevaluate after foot is better Her updated medication list for this problem includes:    Fergon 240 Mg Tabs (Ferrous gluconate) .Marland KitchenMarland KitchenMarland KitchenMarland Kitchen 3 two times a day  Orders: TLB-CBC Platelet - w/Differential (85025-CBCD) TLB-B12 + Folate Pnl ([address]_60454-U98/JXB) TLB-IBC Pnl (Iron/FE;Transferrin) (83550-IBC) Venipuncture (14782)  Hgb: 11.2 (05/18/2009)   Hct: 35.0 (05/18/2009)   Platelets: 169.0 (05/18/2009) RBC: 4.08 (05/18/2009)   RDW: 16.2 (05/18/2009)   WBC: 2.7 (05/18/2009) MCV: 85.7 (05/18/2009)   MCHC: 32.0 (05/18/2009) Ferritin: 31.9 (05/18/2009) Iron: 27 (10/21/2008)   % Sat: 7.2 (10/21/2008) B12: 915 (10/21/2008)   Folate: 19.0 (10/21/2008)   TSH: 1.07 (05/18/2009)  Problem # 2:  HYPERCHOLESTEROLEMIA (ICD-272.0)  Labs Reviewed: SGOT: 28 (05/18/2009)   SGPT: 19 (05/18/2009)   HDL:65.80 (05/18/2009), 71.4 (05/19/2008)  LDL:DEL (05/19/2008), DEL (04/06/2007)  Chol:238 (05/18/2009), 223 (05/19/2008)  Trig:77.0 (05/18/2009), 61 (05/19/2008)  Problem # 3:  OSTEOARTHRITIS (ICD-715.90) Assessment: Comment Only  Problem # 4:  FRACTURE, ANKLE, LEFT (ICD-824.8) Assessment: Comment Only  Problem # 5:  OSTEOPOROSIS (ICD-733.00)  Her updated medication list for this problem includes:    Actonel 35 Mg Tabs (Risedronate sodium) .[name]... 1 by mouth every week    Vitamin D 1000 Unit Tabs (Cholecalciferol) .[name]... Daily    Calcium Carbonate Powd (Calcium carbonate) ..... 1500mg  daily  Problem # 6:  Preventive Health Care (ICD-V70.0) ok to do pap next year   .  has been normal   Complete Medication List: 1)  Actonel 35 Mg Tabs (Risedronate sodium) .[name].. 1 by mouth every week 2)  Fergon 240 Mg Tabs (Ferrous gluconate) .... 3 two times a day 3)  Vitamin C 250 Mg Tabs (Ascorbic acid) .... 3 two times a day 4)  One-a-day Extras Antioxidant Caps (Multiple  vitamins-minerals) 5)  Vitamin D 1000 Unit Tabs (Cholecalciferol) .... Daily 6)  Calcium Carbonate Powd (Calcium carbonate) .... 1500mg  daily 7)  Stool Softener 100 Mg Caps (Docusate sodium) .[name].. 1 two times a day  Other Orders: TD Toxoids IM 7 YR + ([address]) Admin 1st Vaccine (60454)  Patient Instructions: 1)  can do pap next year  2)  You will be informed of lab results when available.  then decide on follow up    Immunizations Administered:  Tetanus Vaccine:    Vaccine Type: Td    Site: left deltoid    Mfr: Sanofi Pasteur    Dose: 0.5 ml    Route: IM    Given by: Romualdo Bolk, CMA (AAMA)    Exp. Date: 03/10/2011    Lot #: [id number]

## 2010-05-25 NOTE — Progress Notes (Signed)
Summary: refill on lexapro  Phone Note Call from Patient Call back at [phone]   Caller: Patient Summary of Call: Pt needs a refill on lexapro called into [facility]. Initial call taken by: Romualdo Bolk, CMA,  July 22, 2008 9:36 AM  Follow-up for Phone Call        I spoke with pt and she states that she is taking lexapro 10mg  1 once daily and it works fine. Sent rx via electronically  Follow-up by: Romualdo Bolk, CMA,  July 22, 2008 9:38 AM    New/Updated Medications: LEXAPRO 10 MG TABS (ESCITALOPRAM OXALATE) 1 by mouth once daily   Prescriptions: LEXAPRO 10 MG TABS (ESCITALOPRAM OXALATE) 1 by mouth once daily  #30 x 3   Entered by:   Romualdo Bolk, CMA   Authorized by:   Madelin Headings MD   Signed by:   Romualdo Bolk, CMA on 07/22/2008   Method used:   Electronically to        [facility]* (retail)       [address] [place], [place]  [address]       Ph: [phone] or [phone]       Fax: [phone]   RxID:   [id number]

## 2010-05-25 NOTE — Consult Note (Signed)
Summary: Dr Cherly Hensen note  Dr Cherly Hensen note   Imported By: Kassie Mends 07/09/2007 10:59:58  _____________________________________________________________________  External Attachment:    Type:   Image     Comment:   Dr Cherly Hensen note

## 2010-05-25 NOTE — Progress Notes (Signed)
Summary: needs bmd order faxed to [facility]  Phone Note Call from Patient Call back at Home Phone [phone]   Caller: Patient Call For: dr Fabian Sharp Summary of Call: pt would like bone density order faxed to [facility] her appt is 03-15-07 at 8.30a   Initial call taken by: Heron Sabins,  January 12, 2007 9:32 AM  Follow-up for Phone Call        Taken care of. Order faxed. Follow-up by: Romualdo Bolk, CMA,  January 12, 2007 11:23 AM

## 2010-05-25 NOTE — Letter (Signed)
Summary: New Patient letter  [facility]  [address] [place], [place] [address]   Phone: [phone]  Fax: [phone]       04/13/2009 MRN: [id number]  [facility] [address] [place], [place]  [address]  Dear Ms. Newlon,  Welcome to the Gastroenterology Division at [facility].    You are scheduled to see Dr.  Arlyce Dice   on  May 07, 2009 at 10am on the 3rd floor at [facility], [address]  We ask that you try to arrive at our office 15 minutes prior to your appointment time to allow for check-in.  We would like you to complete the enclosed self-administered evaluation form prior to your visit and bring it with you on the day of your appointment.  We will review it with you.  Also, please bring a complete list of all your medications or, if you prefer, bring the medication bottles and we will list them.  Please bring your insurance card so that we may make a copy of it.  If your insurance requires a referral to see a specialist, please bring your referral form from your primary care physician.  Co-payments are due at the time of your visit and may be paid by cash, check or credit card.     Your office visit will consist of a consult with your physician (includes a physical exam), any laboratory testing he/she may order, scheduling of any necessary diagnostic testing (e.g. x-ray, ultrasound, CT-scan), and scheduling of a procedure (e.g. Endoscopy, Colonoscopy) if required.  Please allow enough time on your schedule to allow for any/all of these possibilities.    If you cannot keep your appointment, please call [phone] to cancel or reschedule prior to your appointment date.  This allows [place] the opportunity to schedule an appointment for another patient in need of care.  If you do not cancel or reschedule by 5 p.m. the business day prior to your appointment date, you will be charged a $50.00 late cancellation/no-show fee.    Thank you for  choosing [facility] for your medical needs.  We appreciate the opportunity to care for you.  Please visit [place] at our website  to learn more about our practice.                     Sincerely,                                                             The Gastroenterology Division

## 2010-05-25 NOTE — Progress Notes (Signed)
Summary: refill  Phone Note From Pharmacy   Caller: [facility]* Reason for Call: Needs renewal Details for Reason: Actonel Initial call taken by: Romualdo Bolk, CMA,  May 06, 2008 3:38 PM  Follow-up for Phone Call        Sent rx via electronically  Follow-up by: Romualdo Bolk, CMA,  May 06, 2008 3:38 PM    New/Updated Medications: ACTONEL 35 MG TABS (RISEDRONATE SODIUM) 1 by mouth every week   Prescriptions: ACTONEL 35 MG TABS (RISEDRONATE SODIUM) 1 by mouth every week  #4 x 0   Entered by:   Romualdo Bolk, CMA   Authorized by:   Madelin Headings MD   Signed by:   Romualdo Bolk, CMA on 05/06/2008   Method used:   Electronically to        [facility]* (retail)       [address] [place], [place]  [address]       Ph: [phone] or [phone]       Fax: [phone]   RxID:   [id number]

## 2010-05-25 NOTE — Op Note (Signed)
Summary: [facility]  [facility] Provider: This provider was preselected by the workflow.  Signature: The signature status of this document was preset by the workflow  Processed by [employer] Local Indexer Client @ Wednesday, April 15, 2009 10:55:33 AM using version:2010.1.2.11(2.4)   Manually Indexed By: [id number]  idlBatchDetail: [id number]   _____________________________________________________________________  External Attachment:    Type:   Image     Comment:   External Document

## 2010-05-25 NOTE — Procedures (Signed)
Summary: colonoscopy   Colonoscopy  Procedure date:  03/12/2001  Findings:      Results: Normal. Location:  [facility].   Patient Name: Belinda Harper, Belinda Harper MRN:  Procedure Procedures: Colonoscopy CPT: [phone].  Personnel: Endoscopist: [name], MD.  Referred By: Berniece Andreas, MD.  Exam Location: Exam performed in Outpatient Clinic. Outpatient  Patient Consent: Procedure, Alternatives, Risks and Benefits discussed, consent obtained, from patient.  Indications  Evaluation of: Anemia with low ferritin. with low iron saturation.  History  Pre-Exam Physical: Performed Mar 12, 2001. Cardio-pulmonary exam, Rectal exam, HEENT exam , Abdominal exam, Extremity exam, Neurological exam, Mental status exam WNL.  Exam Exam: Extent of exam reached: Cecum, extent intended: Cecum.  ASA Classification: II. Tolerance: good.  Monitoring: Pulse and BP monitoring, Oximetry used. Supplemental O2 given.  Colon Prep Used Golytely for colon prep. Prep results: good.  Sedation Meds: Patient assessed and found to be appropriate for moderate (conscious) sedation. Fentanyl 100 mcg. Versed 7 mg.  Findings - NORMAL EXAM: Cecum to Rectum.   Assessment Normal examination.  Events  Unplanned Interventions: No intervention was required.  Unplanned Events: There were no complications. Plans Disposition: After procedure patient sent to recovery. After recovery patient sent home.  Scheduling/Referral: EGD, to Molly Maduro D. Arlyce Dice, MD, around Mar 19, 2001.  Home stool hemocults, around Mar 14, 2001.    This report was created from the original endoscopy report, which was reviewed and signed by the above listed endoscopist.

## 2010-05-25 NOTE — Assessment & Plan Note (Signed)
Summary: 3WK FU PROCEDURE F-UP AT HOSP/YF    History of Present Illness Visit Type: Initial Visit Primary GI MD: Melvia Heaps MD [facility] Primary Provider: Berniece Andreas, MD Chief Complaint: 3 wk follow-up  c/o incision on leg oozing and may be infected History of Present Illness:   Belinda Harper returned following hospitalization for severe anemia.  She was admitted on April 10, 2009 for a hemoglobin of 4.4. She takes supplementary iron.  Upper endoscopy showed a mild erosive esophagitis.  Hemoccult-Positive stools were noted.  She has no GI complaints at this time.  Hemoglobin on April 20, 2009 was 9.8.  In 2003 the patient underwent upper endoscopy, colonoscopy and capsule endoscopy because of an iron deficiency anemia.  Mild gastritis was seen.  Colonoscopy was normal.  Capsule endoscopy was unrevealing.    GI Review of Systems      Denies abdominal pain, acid reflux, belching, bloating, chest pain, dysphagia with liquids, dysphagia with solids, heartburn, loss of appetite, nausea, vomiting, vomiting blood, weight loss, and  weight gain.        Denies anal fissure, black tarry stools, change in bowel habit, constipation, diarrhea, diverticulosis, fecal incontinence, heme positive stool, hemorrhoids, irritable bowel syndrome, jaundice, light color stool, liver problems, rectal bleeding, and  rectal pain.    Current Medications (verified): 1)  Actonel 35 Mg Tabs (Risedronate Sodium) .[name].. 1 By Mouth Every Week 2)  Fergon 240 Mg  Tabs (Ferrous Gluconate) .... 3 Two Times A Day 3)  Vitamin C 250 Mg  Tabs (Ascorbic Acid) .... 3 Two Times A Day 4)  One-A-Day Extras Antioxidant   Caps (Multiple Vitamins-Minerals) 5)  Vitamin D 1000 Unit Tabs (Cholecalciferol) .... Daily 6)  Calcium Carbonate   Powd (Calcium Carbonate) .... 1500mg  Daily 7)  Cipro 500 Mg Tabs (Ciprofloxacin Hcl) .[name].. 1 By Mouth Two Times A Day 8)  Stool Softener 100 Mg Caps (Docusate Sodium) .[name].. 1 Two Times A  Day  Allergies (verified): 1)  Sulfamethoxazole (Sulfamethoxazole)  Past History:  Past Medical History: Arthritis Allergies Allergic rhinitis Anemia-iron deficiency requiring transfusion and continued iron therapy  hg 4    transfusion 4 units  12 2010 Asthma Depression Osteoarthritis Osteoporosis  dexa -3.o hip 2006 Consults Dr. Smith Rahn [facility]- Oncology  has had bone marrow bx in the past. [facility]- GI Dr. Janey Greaser consult for endometrial  bx and bleed 2009 neg   Gastritis 2002       LAST Mammogram: 04/02/08 Pap: 03/2007 Td: May 19, 2008 Colonscopy: 2008 EKG: 04/06/07 Dexa: 03/28/2007 Eye Exam: 10/09 Other: Smoking: Never Ankle Fracture  Past Surgical History: Panendoscopy small bowel bx Left trimalleolar ankle fracture dislocation Nov 2010 Lt. Ankle Surgery  Family History: Family History of Arthritis Family History High cholesterol Family History Osteoporosis Family History Ovarian cancer Family History Uterine cancer Family History of Cardiovascular disorder father and Mom around 65's  Father:  Heart disorder, arthritis, melanomia Mother: Heart Disorder, high cholesterol, ovarian and uterine cancer, osteoprosis, Arthritis Siblings: Died at age 27  No FH of Colon Cancer:  Social History: Reviewed history from 10/21/2008 and no changes required. Occupation: retired Building surveyor Never Smoked Alcohol use-no Drug use-no Regular exercise-yes Divorced HHof 4     2 teens.    at home now  moved out     Review of Systems       The patient complains of anemia.  The patient denies allergy/sinus, anxiety-new, arthritis/joint pain, back pain, blood in urine, breast changes/lumps, change in vision, confusion,  cough, coughing up blood, depression-new, fainting, fatigue, fever, headaches-new, hearing problems, heart murmur, heart rhythm changes, itching, menstrual pain, muscle pains/cramps, night sweats, nosebleeds, pregnancy symptoms, shortness of  breath, skin rash, sleeping problems, sore throat, swelling of feet/legs, swollen lymph glands, thirst - excessive , urination - excessive , urination changes/pain, urine leakage, vision changes, and voice change.    Vital Signs:  Patient profile:   70 year old female Menstrual status:  postmenopausal Height:      68 inches Weight:      152 pounds BMI:     23.20 Pulse rate:   100 / minute Pulse rhythm:   regular BP sitting:   110 / 70  (left arm)  Vitals Entered By: Belinda Harper NCMA (May 07, 2009 9:54 AM)  Physical Exam  Additional Exam:  N. physical exam she is a well-developed well-nourished female  Examination of the left lower extremity reveals some crusting and mild induration and erythema at the most proximal end of the incision and just above her ankle   Impression & Recommendations:  Problem # 1:  ANEMIA-IRON DEFICIENCY (ICD-280.9) Etiology for her anemia has not been determined.  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  Recommendations #1 colonoscopy.  If negative I will repeat her capsule endoscopy.  The patient wishes to defer any further GI workup until she is more ambulatory. She has a fractured ankle.   In the interim she will continue iron supplementation.  She was carefully instructed to contact me if she feels short of breath or week, possibly indicating a worsening anemia, at which point I would check her blood counts.

## 2010-05-25 NOTE — Progress Notes (Signed)
Summary: pt is requesting iron test  Phone Note Call from Patient Call back at Home Phone [phone]   Caller: Patient Call For: Madelin Headings MD Summary of Call: pt would like a iron level  test. Initial call taken by: Heron Sabins,  January 25, 2010 10:19 AM  Follow-up for Phone Call        ask patient  about how she is feeling.   If stable order CBCdiff IBC and ferritin.   dx  iron  deficiency.  Follow-up by: Madelin Headings MD,  January 26, 2010 4:57 PM  Additional Follow-up for Phone Call Additional follow up Details #1::        Pt's states that her hemmorriods are bleeding and no energy. Pt states that the hemmorroids don't hurt but has bleeding alot lately. Pt states that one day they may bleed and for the past couple of weeks they have been bleeding daily. It looks like alot of blood in the toliet. Pt is wanting to know if okay to take glucosamine chondroitin. Additional Follow-up by: Romualdo Bolk, CMA Duncan Dull),  January 27, 2010 8:57 AM    Additional Follow-up for Phone Call Additional follow up Details #2::    see ov  Follow-up by: Madelin Headings MD,  January 27, 2010 2:42 PM

## 2010-05-25 NOTE — Progress Notes (Signed)
Summary: faint feeling ams  Phone Note Call from Patient Call back at Home Phone [phone]   Call For: Belinda Harper Reason for Call: Talk to Nurse Summary of Call: Broken ankle. Am home.   Every morning when getting ready, feel like I'll pass out.  Everything goes white, ears ringing, get over it, day goes on, scary.  Have med that works.  Need to find out what's going on.  Called patient.  She had surgery on ankle and is doing well with it.  In hospital Hemoglobin was 10.  She is taking the iron supplement 3 bid.  Was already weaning the Lexapro & it was not given in hospital & she didn't resume, so off since 11-26.  A coated ASA was added to prevent blood clots.  Has not needed the Oxycontin for pain.  Hears her pulse beating left ear at night.  Thinks it's ear wax & will get OTC earwax removal kit.  Advised her to  take glass of water before getting out of bed.  Eat & drink breakfast before shower.  If she washes out ear, do it sitting & with care.  Has regular checkup end of Feb.  OV now if Dr. Birdie Sons thinks needed.   Rudy Jew, RN  April 08, 2009 9:14 AM    Initial call taken by: Rudy Jew, RN,  April 08, 2009 8:53 AM  Follow-up for Phone Call        Per Dr. Kalman Shan lexapro 5mg  once daily to see if this helps (up date meds) and call to [place] to let us know how doing. Follow-up by: Romualdo Bolk, CMA (AAMA),  April 08, 2009 5:25 PM  Additional Follow-up for Phone Call Additional follow up Details #1::        Spoke to pt and she doesn't want to start back on the lexapro. Pt states that she has had 3 session where she has felt like she is going to pass out but hasn't passed out.  Surgery on 11/26. Additional Follow-up by: Romualdo Bolk, CMA (AAMA),  April 08, 2009 5:45 PM    New/Updated Medications: CIPRO 500 MG TABS (CIPROFLOXACIN HCL) 1 by mouth two times a day ov made .

## 2010-05-25 NOTE — Letter (Signed)
Summary: [facility]  [facility]   Imported By: Maryln Gottron 05/23/2008 13:41:43  _____________________________________________________________________  External Attachment:    Type:   Image     Comment:   External Document

## 2010-06-25 ENCOUNTER — Encounter: Payer: Self-pay | Admitting: Internal Medicine

## 2010-06-28 ENCOUNTER — Encounter: Payer: Self-pay | Admitting: Internal Medicine

## 2010-06-28 ENCOUNTER — Ambulatory Visit (INDEPENDENT_AMBULATORY_CARE_PROVIDER_SITE_OTHER): Payer: Medicare Other | Admitting: Internal Medicine

## 2010-06-28 ENCOUNTER — Other Ambulatory Visit (HOSPITAL_COMMUNITY)
Admission: RE | Admit: 2010-06-28 | Discharge: 2010-06-28 | Disposition: A | Discharge status: Home / Self Care | Payer: Medicare Other | Source: Ambulatory Visit | Attending: Internal Medicine | Admitting: Internal Medicine

## 2010-06-28 VITALS — BP 120/80 | HR 66 | Ht 68.0 in | Wt 152.0 lb

## 2010-06-28 DIAGNOSIS — M1990 Unspecified osteoarthritis, unspecified site: Secondary | ICD-10-CM

## 2010-06-28 DIAGNOSIS — Z23 Encounter for immunization: Secondary | ICD-10-CM

## 2010-06-28 DIAGNOSIS — F4323 Adjustment disorder with mixed anxiety and depressed mood: Secondary | ICD-10-CM

## 2010-06-28 DIAGNOSIS — E785 Hyperlipidemia, unspecified: Secondary | ICD-10-CM

## 2010-06-28 DIAGNOSIS — Z124 Encounter for screening for malignant neoplasm of cervix: Secondary | ICD-10-CM

## 2010-06-28 DIAGNOSIS — D509 Iron deficiency anemia, unspecified: Secondary | ICD-10-CM

## 2010-06-28 DIAGNOSIS — Z0000 Encounter for general adult medical examination without abnormal findings: Secondary | ICD-10-CM

## 2010-06-28 DIAGNOSIS — S82899A Other fracture of unspecified lower leg, initial encounter for closed fracture: Secondary | ICD-10-CM

## 2010-06-28 DIAGNOSIS — K649 Unspecified hemorrhoids: Secondary | ICD-10-CM

## 2010-06-28 DIAGNOSIS — Z2911 Encounter for prophylactic immunotherapy for respiratory syncytial virus (RSV): Secondary | ICD-10-CM

## 2010-06-28 DIAGNOSIS — K623 Rectal prolapse: Secondary | ICD-10-CM

## 2010-06-28 DIAGNOSIS — E7800 Pure hypercholesterolemia, unspecified: Secondary | ICD-10-CM

## 2010-06-28 LAB — TSH: TSH: 0.8 u[IU]/mL (ref 0.35–5.50)

## 2010-06-28 LAB — CBC WITH DIFFERENTIAL/PLATELET
Basophils Absolute: 0 10*3/uL (ref 0.0–0.1)
Basophils Relative: 0.8 % (ref 0.0–3.0)
Eosinophils Absolute: 0.1 10*3/uL (ref 0.0–0.7)
Eosinophils Relative: 3.6 % (ref 0.0–5.0)
HCT: 34 % — ABNORMAL LOW (ref 36.0–46.0)
Hemoglobin: 11.1 g/dL — ABNORMAL LOW (ref 12.0–15.0)
Lymphocytes Relative: 44.1 % (ref 12.0–46.0)
Lymphs Abs: 1.5 10*3/uL (ref 0.7–4.0)
MCHC: 32.7 g/dL (ref 30.0–36.0)
MCV: 84.6 fl (ref 78.0–100.0)
Monocytes Absolute: 0.3 10*3/uL (ref 0.1–1.0)
Monocytes Relative: 8.2 % (ref 3.0–12.0)
Neutro Abs: 1.4 10*3/uL (ref 1.4–7.7)
Neutrophils Relative %: 43.3 % (ref 43.0–77.0)
Platelets: 182 10*3/uL (ref 150.0–400.0)
RBC: 4.02 Mil/uL (ref 3.87–5.11)
RDW: 17.4 % — ABNORMAL HIGH (ref 11.5–14.6)
WBC: 3.3 10*3/uL — ABNORMAL LOW (ref 4.5–10.5)

## 2010-06-28 LAB — BASIC METABOLIC PANEL
BUN: 26 mg/dL — ABNORMAL HIGH (ref 6–23)
CO2: 31 mEq/L (ref 19–32)
Calcium: 9.7 mg/dL (ref 8.4–10.5)
Chloride: 102 mEq/L (ref 96–112)
Creatinine, Ser: 0.9 mg/dL (ref 0.4–1.2)
GFR: 68.73 mL/min (ref >60.00)
Glucose, Bld: 81 mg/dL (ref 70–99)
Potassium: 4.5 mEq/L (ref 3.5–5.1)
Sodium: 141 mEq/L (ref 135–145)

## 2010-06-28 LAB — LIPID PANEL
Cholesterol: 214 mg/dL — ABNORMAL HIGH (ref 0–200)
HDL: 57.9 mg/dL (ref >39.00)
Total CHOL/HDL Ratio: 4
Triglycerides: 118 mg/dL (ref 0.0–149.0)
VLDL: 23.6 mg/dL (ref 0.0–40.0)

## 2010-06-28 LAB — HEPATIC FUNCTION PANEL
ALT: 14 U/L (ref 0–35)
AST: 24 U/L (ref 0–37)
Albumin: 4.2 g/dL (ref 3.5–5.2)
Alkaline Phosphatase: 71 U/L (ref 39–117)
Bilirubin, Direct: 0.1 mg/dL (ref 0.0–0.3)
Total Bilirubin: 0.3 mg/dL (ref 0.3–1.2)
Total Protein: 7.2 g/dL (ref 6.0–8.3)

## 2010-06-28 LAB — LDL CHOLESTEROL, DIRECT: Direct LDL: 129.6 mg/dL

## 2010-06-28 NOTE — Progress Notes (Signed)
Subjective:    Belinda Harper is a 70 y.o. female who presents for a  Medicare  Wellness and exam.  No major change in health status since last visit .  Cardiac risk factors: advanced age (older than 35 for men, 10 for women).  Activities of Daily Living  In your present state of health, do you have any difficulty performing the following activities?:  Preparing food and eating?: No Bathing yourself: No Getting dressed: No Using the toilet:No Moving around from place to place: No In the past year have you fallen or had a near fall?:Yes  Current exercise habits: Home exercise routine includes walking 1 hrs per week. Gym/ health club routine includes water aerobics.   Dietary issues discussed: iron calcium  No falls .  Has smoke detector and wears seat belts.  No firearms. No excess sun exposure. Sees dentist regularly . No depression Hearing: good Vision:  Wears glasses  No difficulty Advance directive :  Reviewed  Memory: good    Depression Screen (Note: if answer to either of the following is "Yes", then a more complete depression screening is indicated)  Q1: Over the past two weeks, have you felt down, depressed or hopeless?no Q2: Over the past two weeks, have you felt little interest or pleasure in doing things? no   The following portions of the patient's history were reviewed and updated as appropriate: allergies, current medications, past family history, past medical history, past social history, past surgical history and problem list. Review of Systems A comprehensive review of systems was negative except for:   groin pain got better   After stopping actonel, foot still bothersome  .  Pod rec surgery but  Doesn't want to do this. Hemorrhoid bleeding  Off and on. NO cp sob rashes  Other as per hpi.   Feels clumsy and off balance from foot  But no falling.   Objective:     Vision by Snellen chart: right eye:, left eye:  Per eye doctor she sees has glasses  Blood pressure  120/80, pulse 66, height 5\' 8"  (1.727 m), weight 152 lb (68.947 kg). Body mass index is 23.11 kg/(m^2).  Physical Exam: Vital signs reviewed ZOX:WRUE is a well-developed well-nourished alert cooperative  white female who appears her stated age in no acute distress.  HEENT: normocephalic  traumatic , Eyes: PERRL EOM's full, conjunctiva clear, Nares: paten,t no deformity discharge or tenderness., Ears: no deformity EAC's clear TMs with normal landmarks. Mouth: clear OP, no lesions, edema.  Moist mucous membranes. Dentition in adequate repair. NECK: supple without masses, thyromegaly or bruits. CHEST/PULM:  Clear to auscultation and percussion breath sounds equal no wheeze , rales or rhonchi. No chest wall deformities or tenderness. CV: PMI is nondisplaced, S1 S2 no gallops, murmurs, rubs. Peripheral pulses are full without delay.No JVD . Breast: normal by inspection . No dimpling, discharge, masses, tenderness or discharge .  ABDOMEN: Bowel sounds normal nontender  No guard or rebound, no hepato splenomegal no CVA tenderness.  No hernia. Extremtities:  No clubbing cyanosis or edema, no acute joint swelling or redness no focal atrophy NEURO:  Oriented x3, cranial nerves 3-12 appear to be intact, no obvious focal weakness,gait within normal limits no abnormal reflexes or asymmetrical SKIN: No acute rashes normal turgor, color, no bruising or petechiae. PSYCH: Oriented, good eye contact, no obvious depression anxiety, cognition and judgment appear normal. Pelvic: NL ext GU, labia clear without lesions or rash . Vagina no lesions .Cervix: clear  UTERUS:  Neg urethra prolapsed slightly CMT Adnexa:  clear no masses . PAP done Rectal large hemorrhoids with prolapsing rectal no other masses  No stool slight blood on glove       Assessment:  Prevention and wellness  Pap done  utd  Anemia hx of severe anemia in 2010 transfusion with hg of 4  Osteoporosis  dexa 2006 -3.0   Hx of ankle fracture  Prolapsing  and bleeding hemorrhoids.   Suggest consult for poss treatment .   Poss contributing to recurring anemia OA no change        Plan:     During the course of the visit the patient was educated and counseled about appropriate screening and preventive services including:   Screening mammography  Screening Pap smear and pelvic exam   Bone densitometry screening  Patient Instructions (the written plan) was given to the patient.  Ok to stop actonel for now until next DEXA  Second opinion about   Foot ok.    Will do referral about hemorrhoids. Labs today.

## 2010-06-28 NOTE — Assessment & Plan Note (Signed)
Prolapsing and bleeding regularly  posscontributing to her chronic iron def anemia.   rec consult with surgeon.  Of specialist.

## 2010-07-03 ENCOUNTER — Encounter: Payer: Self-pay | Admitting: Internal Medicine

## 2010-07-03 DIAGNOSIS — K623 Rectal prolapse: Secondary | ICD-10-CM | POA: Insufficient documentation

## 2010-07-03 NOTE — Assessment & Plan Note (Signed)
Stable doing well. 

## 2010-07-03 NOTE — Assessment & Plan Note (Signed)
Has been stable  On iron supp.   Feels well today.

## 2010-07-06 ENCOUNTER — Encounter: Payer: Self-pay | Admitting: *Deleted

## 2010-07-07 ENCOUNTER — Other Ambulatory Visit: Payer: Self-pay | Admitting: *Deleted

## 2010-07-07 MED ORDER — HYDROCORTISONE ACETATE 25 MG RE SUPP: 25.0000 mg | Freq: Two times a day (BID) | RECTAL | Status: AC

## 2010-07-07 NOTE — Telephone Encounter (Signed)
Ok to refill x 2  

## 2010-07-07 NOTE — Telephone Encounter (Signed)
Last filled on 03/27/2009 #28

## 2010-07-26 LAB — PROTIME-INR
INR: 1.04 (ref 0.00–1.49)
Prothrombin Time: 13.5 seconds (ref 11.6–15.2)

## 2010-07-26 LAB — BASIC METABOLIC PANEL
BUN: 26 mg/dL — ABNORMAL HIGH (ref 6–23)
CO2: 27 mEq/L (ref 19–32)
Calcium: 8.5 mg/dL (ref 8.4–10.5)
Chloride: 100 mEq/L (ref 96–112)
Creatinine, Ser: 0.89 mg/dL (ref 0.4–1.2)
GFR calc Af Amer: >60 mL/min (ref >60)
GFR calc non Af Amer: >60 mL/min (ref >60)
Glucose, Bld: 96 mg/dL (ref 70–99)
Potassium: 4.2 mEq/L (ref 3.5–5.1)
Sodium: 134 mEq/L — ABNORMAL LOW (ref 135–145)

## 2010-07-26 LAB — DIFFERENTIAL
Basophils Absolute: 0.1 10*3/uL (ref 0.0–0.1)
Basophils Relative: 1 % (ref 0–1)
Eosinophils Absolute: 0.1 10*3/uL (ref 0.0–0.7)
Eosinophils Relative: 2 % (ref 0–5)
Lymphocytes Relative: 17 % (ref 12–46)
Lymphs Abs: 1 10*3/uL (ref 0.7–4.0)
Monocytes Absolute: 0.4 10*3/uL (ref 0.1–1.0)
Monocytes Relative: 7 % (ref 3–12)
Neutro Abs: 4.4 10*3/uL (ref 1.7–7.7)
Neutrophils Relative %: 73 % (ref 43–77)

## 2010-07-26 LAB — CROSSMATCH
ABO/RH(D): O POS
Antibody Screen: NEGATIVE

## 2010-07-26 LAB — HEMOGLOBIN AND HEMATOCRIT, BLOOD
HCT: 28 % — ABNORMAL LOW (ref 36.0–46.0)
Hemoglobin: 9.1 g/dL — ABNORMAL LOW (ref 12.0–15.0)

## 2010-07-26 LAB — URINE CULTURE: Colony Count: 60000

## 2010-07-26 LAB — URINALYSIS, ROUTINE W REFLEX MICROSCOPIC
Bilirubin Urine: NEGATIVE
Glucose, UA: NEGATIVE mg/dL
Hgb urine dipstick: NEGATIVE
Ketones, ur: NEGATIVE mg/dL
Nitrite: NEGATIVE
Protein, ur: NEGATIVE mg/dL
Specific Gravity, Urine: 1.009 (ref 1.005–1.030)
Urobilinogen, UA: 0.2 mg/dL (ref 0.0–1.0)
pH: 7 (ref 5.0–8.0)

## 2010-07-26 LAB — PREPARE FRESH FROZEN PLASMA

## 2010-07-26 LAB — URINE MICROSCOPIC-ADD ON

## 2010-07-26 LAB — CBC
HCT: 13.7 % — ABNORMAL LOW (ref 36.0–46.0)
HCT: 27.3 % — ABNORMAL LOW (ref 36.0–46.0)
Hemoglobin: 4.4 g/dL — CL (ref 12.0–15.0)
Hemoglobin: 8.9 g/dL — ABNORMAL LOW (ref 12.0–15.0)
MCHC: 31.8 g/dL (ref 30.0–36.0)
MCHC: 32.8 g/dL (ref 30.0–36.0)
MCV: 88.6 fL (ref 78.0–100.0)
MCV: 89.9 fL (ref 78.0–100.0)
Platelets: 257 10*3/uL (ref 150–400)
Platelets: 307 10*3/uL (ref 150–400)
RBC: 1.52 MIL/uL — ABNORMAL LOW (ref 3.87–5.11)
RBC: 3.08 MIL/uL — ABNORMAL LOW (ref 3.87–5.11)
RDW: 18.2 % — ABNORMAL HIGH (ref 11.5–15.5)
RDW: 24.4 % — ABNORMAL HIGH (ref 11.5–15.5)
WBC: 5.4 10*3/uL (ref 4.0–10.5)
WBC: 6 10*3/uL (ref 4.0–10.5)

## 2010-07-26 LAB — GLIADIN ANTIBODIES, SERUM
Gliadin IgA: 1.1 U/mL (ref <7)
Gliadin IgG: 0.5 U/mL (ref <7)

## 2010-07-26 LAB — HEMOCCULT GUIAC POC 1CARD (OFFICE): Fecal Occult Bld: POSITIVE

## 2010-07-26 LAB — PREPARE RBC (CROSSMATCH)

## 2010-07-26 LAB — FOLATE: Folate: >20 ng/mL

## 2010-07-26 LAB — ENDOMYSIAL IGA ANTIBODY: Endomysial IgA Autoabs: NEGATIVE

## 2010-07-26 LAB — TISSUE TRANSGLUTAMINASE, IGA: Tissue Transglutaminase Ab, IgA: 0.2 U/mL (ref <7)

## 2010-07-26 LAB — VITAMIN B12: Vitamin B-12: 679 pg/mL (ref 211–911)

## 2010-07-26 LAB — IRON: Iron: 12 ug/dL — ABNORMAL LOW (ref 42–135)

## 2010-07-26 LAB — APTT: aPTT: 23 seconds — ABNORMAL LOW (ref 24–37)

## 2010-07-26 LAB — ABO/RH: ABO/RH(D): O POS

## 2010-07-28 LAB — BASIC METABOLIC PANEL
BUN: 18 mg/dL (ref 6–23)
CO2: 31 mEq/L (ref 19–32)
Calcium: 8.9 mg/dL (ref 8.4–10.5)
Chloride: 104 mEq/L (ref 96–112)
Creatinine, Ser: 0.7 mg/dL (ref 0.4–1.2)
GFR calc Af Amer: >60 mL/min (ref >60)
GFR calc non Af Amer: >60 mL/min (ref >60)
Glucose, Bld: 92 mg/dL (ref 70–99)
Potassium: 4.1 mEq/L (ref 3.5–5.1)
Sodium: 139 mEq/L (ref 135–145)

## 2010-07-28 LAB — URINE MICROSCOPIC-ADD ON

## 2010-07-28 LAB — URINE CULTURE
Colony Count: NO GROWTH
Colony Count: NO GROWTH
Culture: NO GROWTH
Culture: NO GROWTH

## 2010-07-28 LAB — URINALYSIS, ROUTINE W REFLEX MICROSCOPIC
Bilirubin Urine: NEGATIVE
Bilirubin Urine: NEGATIVE
Glucose, UA: NEGATIVE mg/dL
Glucose, UA: NEGATIVE mg/dL
Hgb urine dipstick: NEGATIVE
Ketones, ur: 15 mg/dL — AB
Ketones, ur: 15 mg/dL — AB
Nitrite: NEGATIVE
Nitrite: NEGATIVE
Protein, ur: 30 mg/dL — AB
Protein, ur: NEGATIVE mg/dL
Specific Gravity, Urine: 1.012 (ref 1.005–1.030)
Specific Gravity, Urine: 1.02 (ref 1.005–1.030)
Urobilinogen, UA: 0.2 mg/dL (ref 0.0–1.0)
Urobilinogen, UA: 0.2 mg/dL (ref 0.0–1.0)
pH: 7.5 (ref 5.0–8.0)
pH: 7.5 (ref 5.0–8.0)

## 2010-07-28 LAB — PROTIME-INR
INR: 1.03 (ref 0.00–1.49)
Prothrombin Time: 13.4 seconds (ref 11.6–15.2)

## 2010-07-28 LAB — CBC
HCT: 30.4 % — ABNORMAL LOW (ref 36.0–46.0)
Hemoglobin: 10.1 g/dL — ABNORMAL LOW (ref 12.0–15.0)
MCHC: 33.3 g/dL (ref 30.0–36.0)
MCV: 83 fL (ref 78.0–100.0)
Platelets: 182 10*3/uL (ref 150–400)
RBC: 3.66 MIL/uL — ABNORMAL LOW (ref 3.87–5.11)
RDW: 18.2 % — ABNORMAL HIGH (ref 11.5–15.5)
WBC: 5.5 10*3/uL (ref 4.0–10.5)

## 2010-07-28 LAB — DIFFERENTIAL
Basophils Absolute: 0 10*3/uL (ref 0.0–0.1)
Basophils Relative: 0 % (ref 0–1)
Eosinophils Absolute: 0.1 10*3/uL (ref 0.0–0.7)
Eosinophils Relative: 2 % (ref 0–5)
Lymphocytes Relative: 20 % (ref 12–46)
Lymphs Abs: 1.1 10*3/uL (ref 0.7–4.0)
Monocytes Absolute: 0.4 10*3/uL (ref 0.1–1.0)
Monocytes Relative: 7 % (ref 3–12)
Neutro Abs: 4 10*3/uL (ref 1.7–7.7)
Neutrophils Relative %: 71 % (ref 43–77)

## 2010-07-28 LAB — APTT: aPTT: 28 seconds (ref 24–37)

## 2010-09-10 NOTE — Discharge Summary (Signed)
   Belinda Harper, Belinda Harper                           ACCOUNT NO.:  [id number]   MEDICAL RECORD NO.:  [id number]                   PATIENT TYPE:  INP   LOCATION:  0480                                 FACILITY:  [facility]   PHYSICIAN:  Rene Paci, M.D. [facility]          DATE OF BIRTH:  Dec 25, 1940   DATE OF ADMISSION:  02/01/2002  DATE OF DISCHARGE:  02/03/2002                                 DISCHARGE SUMMARY   DISCHARGE DIAGNOSES:  1. Severe iron-deficiency anemia, chronic and recurrent, improved.  2. Mild unspecified gastritis.   DISCHARGE MEDICATIONS:  1. Nu-Iron 150 mg p.o. q.d.  2. Protonix 40 mg p.o. q.d.  3. Multivitamin q.d.  4. Calcium supplement p.o. q.d.   FOLLOWUP:  With her primary care physician, Dr. Fabian Sharp, and with her GI  physician, Dr. Arlyce Dice, to be scheduled by the patient in the next one to two  weeks to follow up on test results.   CONDITION ON DISCHARGE:  Improved and stable.  The patient is discharged to  home in good condition.   HISTORY OF PRESENT ILLNESS:  The patient is a pleasant 70 year old white  female with a history of recurrent iron-deficiency anemia who presented to  her primary care physician's office secondary to increasing fatigue.  Hemoglobin was found to be 5.1, and she was admitted for repeat GI workup  and blood transfusions.   HOSPITAL COURSE:  SEVERE IRON-DEFICIENCY ANEMIA RECURRENCE:  Repeat  hemoglobin in the hospital was 4.7, and the patient was typed and crossed  for 2 units of blood.  She had appropriate rise to 6.7, but was then given  another 2 units of packed red blood cells.  GI consult was again obtained to  review her previous history of chronic gastritis.  She had a negative  colonoscopy and esophagogastroduodenoscopy in 11/02, and a negative small  bowel follow through in 12/02.  She was heme positive on examination.  Esophagogastroduodenoscopy this admission showed persistent nonspecific  gastritis.  Small bowel biopsies were  obtained to exclude sprue.  No change  in management at this time.  The patient is to follow up with her primary  care physician and GI physician, Dr. Arlyce Dice, to follow up on results and  pursue another small bowel capsule evaluation.   LABORATORY DATA:  At time of discharge, hemoglobin of 9.9, vitamin B12 of  360, ferratin of 4, iron of 10, percent saturation 5, TIBC of 393.  Reticulocyte count normal.                                               Rene Paci, M.D. [facility]    VL/MEDQ  D:  02/03/2002  T:  02/03/2002  Job:  [id number]   cc:   [name], M.D. [facility]

## 2010-09-10 NOTE — Consult Note (Signed)
NAME:  Belinda Harper, Belinda Harper                           ACCOUNT NO.:  [id number]   MEDICAL RECORD NO.:  [id number]                   PATIENT TYPE:  INP   LOCATION:  0480                                 FACILITY:  [facility]   PHYSICIAN:  Iva Boop, M.D. [facility]           DATE OF BIRTH:  1940-08-31   DATE OF CONSULTATION:  DATE OF DISCHARGE:                                   CONSULTATION   DATE OF BIRTH:  [birth year]   REASON FOR CONSULTATION:  I am asked to see this patient regarding anemia  and heme positive stool.   HISTORY OF PRESENT ILLNESS:  This is a pleasant 70 year old divorced white  woman that has had a history of iron deficiency anemia dating back to at  least last year. Actually, she says that for years, perhaps twenty years off  and on, she has refused the opportunity to donate blood due to low iron. She  only recalls being anemia once before. She had an EGD and colonoscopy by Dr.  Melvia Heaps in November of 2002. I have reviewed those noted. Colonoscopy  was normal to the cecum with a good prep. The upper endoscopy showed some  erosions and erythematous mucosa in the antrum and the biopsies were really  nonspecific. She is prescribed H2 blocker which she took for a couple of  months. She was also on iron and she has continued to take an iron tablet  once a day since that time. Her hemoglobin was in the 8 range at that time.  She was evaluated recently by Dr. Fabian Sharp and had a hemoglobin of 5.1 today.  MCV was 68.1. I am told that she was heme  positive as well. The patient  only rarely sees some bright red blood per rectum when she wipes and she  says that she has hemorrhoids. She had noticed some increasing fatigue and  inability to complete aerobic exercise workouts in the water over the past  few months. She denies any chest pain or presyncopal symptoms. Her Ferritin  was 3 last year when her hemoglobin was 8.8. She had a small bowel series as  well in December of 2002  and this was unremarkable. She denies any nausea,  vomiting, abdominal pain, weight loss, or easy bruising or other bleeding  symptoms. Her bowel habits are unchanged. There is no family history of  colon cancer or GI diseases as far as I can tell. Of note, the platelet  count is normal at 269. White count is low at 3.7. There is only very rare  Advil use. Her other medications are multivitamins and calcium.   ALLERGIES:  No known drug allergies.   PAST MEDICAL HISTORY:  She has had a traumatic injury to a digit in  childbirth. Otherwise, she has been very healthy.   SOCIAL HISTORY:  She is divorced. She works as a Quarry manager. She  neither smokes  or drinks and lives with her son and grandchildren or they  live with her.   REVIEW OF SYSTEMS:  Notable for some back pain at times, for which she  rarely takes Tylenol and some other joint pains as well. She wears eye  glasses. Other than the fatigue, shortness of breath, and loss of energy,  she has been without other problems upon complete review of systems.   PHYSICAL EXAMINATION:  GENERAL: Pleasant, middle aged white woman in no  acute distress.  VITAL SIGNS: Weight 149 pounds. Pulse 80 and regular. Temperature 97.2.  Blood pressure 100/62, respiratory rate 18.  HEENT: Eyes nonicteric. Conjunctiva pale. Mouth and posterior pharynx are  free of lesions. Mucosa is pale. No telangectasia noted.  SKIN: Pale. She has some cherry nevi but no telangectasia or petechia.  NECK: Supple without thyromegaly or mass.  LUNGS: Clear.  HEART: A II/VI systolic murmur heard essentially in both upper sternal  borders. No jugular venous distention.  ABDOMEN: Soft and nontender without organomegaly or mass.  RECTAL: Not performed. Heme positive with hemorrhoidal tags noted.  LYMPH: No neck or supraclavicular or axillary or inguinal nodes.  EXTREMITIES: Free of clubbing, cyanosis, or edema.  NEURO: Alert and oriented times three.   LABORATORY  DATA:  As mentioned in the HPI.   ASSESSMENT:  Iron deficiency anemia, most likely given the findings as  above. MCV is 68, RDW is elevated at 21.7. She does have some leukopenia  which could suggest a hematologic abnormality other than iron deficiency,  though her platelet count is normal. Differential appears normal. I am  suspicious of a possible malabsorptive process such as sprue. I think this  should be ruled out. I think it is very unlikely that she has a malignancy  that has not been ruled out completely. I would have expected her to lose  weight and be more ill if something like that had not been seen and had been  present one year ago when she had her endoscopic workup.   PLAN:  1. Agree with transfusion.  2. Would plan for EGD with small bowel biopsy tomorrow, if we can accomplish     that.  3. Further workup pending the above.  4. May require capsule endoscopy versus enteroscopy versus repeat     colonoscopy perhaps.   I appreciate the opportunity to care for this patient.                                               Iva Boop, M.D. [facility]    CEG/MEDQ  D:  02/01/2002  T:  02/02/2002  Job:  [id number]   cc:   [name], M.D. [facility]

## 2011-02-09 ENCOUNTER — Telehealth: Payer: Self-pay | Admitting: Internal Medicine

## 2011-02-09 NOTE — Telephone Encounter (Signed)
Appt scheduled with Dr. Fabian Sharp Friday.

## 2011-02-09 NOTE — Telephone Encounter (Signed)
Pt called and said that she has had a rash on her leg for approx 1 month. Pt says that some of the rash has raised bumps and other part of rash is red and splotchy. Rash is tender to touch. Does not itch or burn. Feels like a bruise. Pt req work in ov with pcp tomorrow afternoon or on Friday.

## 2011-02-11 ENCOUNTER — Encounter: Payer: Self-pay | Admitting: Internal Medicine

## 2011-02-11 ENCOUNTER — Ambulatory Visit (INDEPENDENT_AMBULATORY_CARE_PROVIDER_SITE_OTHER): Payer: Medicare Other | Admitting: Internal Medicine

## 2011-02-11 VITALS — BP 120/80 | HR 90 | Temp 99.3°F | Wt 152.0 lb

## 2011-02-11 DIAGNOSIS — R8290 Unspecified abnormal findings in urine: Secondary | ICD-10-CM | POA: Insufficient documentation

## 2011-02-11 DIAGNOSIS — D509 Iron deficiency anemia, unspecified: Secondary | ICD-10-CM

## 2011-02-11 DIAGNOSIS — R21 Rash and other nonspecific skin eruption: Secondary | ICD-10-CM

## 2011-02-11 DIAGNOSIS — R05 Cough: Secondary | ICD-10-CM

## 2011-02-11 DIAGNOSIS — R82998 Other abnormal findings in urine: Secondary | ICD-10-CM

## 2011-02-11 DIAGNOSIS — R059 Cough, unspecified: Secondary | ICD-10-CM

## 2011-02-11 DIAGNOSIS — R058 Other specified cough: Secondary | ICD-10-CM

## 2011-02-11 DIAGNOSIS — R5381 Other malaise: Secondary | ICD-10-CM

## 2011-02-11 LAB — POCT URINALYSIS DIPSTICK
Bilirubin, UA: NEGATIVE
Glucose, UA: NEGATIVE
Ketones, UA: NEGATIVE
Nitrite, UA: NEGATIVE
Protein, UA: NEGATIVE
Spec Grav, UA: 1.025
Urobilinogen, UA: 0.2
pH, UA: 6.5

## 2011-02-11 LAB — BASIC METABOLIC PANEL
BUN: 23 mg/dL (ref 6–23)
CO2: 31 mEq/L (ref 19–32)
Calcium: 9.4 mg/dL (ref 8.4–10.5)
Chloride: 102 mEq/L (ref 96–112)
Creatinine, Ser: 0.7 mg/dL (ref 0.4–1.2)
GFR: 88.17 mL/min (ref >60.00)
Glucose, Bld: 99 mg/dL (ref 70–99)
Potassium: 4.4 mEq/L (ref 3.5–5.1)
Sodium: 141 mEq/L (ref 135–145)

## 2011-02-11 LAB — CBC WITH DIFFERENTIAL/PLATELET
Basophils Absolute: 0 10*3/uL (ref 0.0–0.1)
Basophils Relative: 1 % (ref 0.0–3.0)
Eosinophils Absolute: 0.1 10*3/uL (ref 0.0–0.7)
Eosinophils Relative: 3.5 % (ref 0.0–5.0)
HCT: 26.5 % — ABNORMAL LOW (ref 36.0–46.0)
Hemoglobin: 8.7 g/dL — ABNORMAL LOW (ref 12.0–15.0)
Lymphocytes Relative: 34.7 % (ref 12.0–46.0)
Lymphs Abs: 1.3 10*3/uL (ref 0.7–4.0)
MCHC: 32.7 g/dL (ref 30.0–36.0)
MCV: 84.6 fl (ref 78.0–100.0)
Monocytes Absolute: 0.3 10*3/uL (ref 0.1–1.0)
Monocytes Relative: 8.1 % (ref 3.0–12.0)
Neutro Abs: 2 10*3/uL (ref 1.4–7.7)
Neutrophils Relative %: 52.7 % (ref 43.0–77.0)
Platelets: 294 10*3/uL (ref 150.0–400.0)
RBC: 3.14 Mil/uL — ABNORMAL LOW (ref 3.87–5.11)
RDW: 17 % — ABNORMAL HIGH (ref 11.5–14.6)
WBC: 3.8 10*3/uL — ABNORMAL LOW (ref 4.5–10.5)

## 2011-02-11 LAB — HEPATIC FUNCTION PANEL
ALT: 16 U/L (ref 0–35)
AST: 22 U/L (ref 0–37)
Albumin: 3.9 g/dL (ref 3.5–5.2)
Alkaline Phosphatase: 59 U/L (ref 39–117)
Bilirubin, Direct: 0 mg/dL (ref 0.0–0.3)
Total Bilirubin: 0.3 mg/dL (ref 0.3–1.2)
Total Protein: 7.2 g/dL (ref 6.0–8.3)

## 2011-02-11 LAB — TSH: TSH: 0.56 u[IU]/mL (ref 0.35–5.50)

## 2011-02-11 MED ORDER — DOXYCYCLINE HYCLATE 100 MG PO CAPS: 100.0000 mg | ORAL_CAPSULE | Freq: Two times a day (BID) | ORAL | Status: AC

## 2011-02-11 NOTE — Patient Instructions (Signed)
Unsure what the rash is  I suppose could be  a low grade skin infection  But not typical. Can take antibiotic . Will notify you  of labs when available. And then make a plan  For follow up.

## 2011-02-11 NOTE — Progress Notes (Signed)
  Subjective:    Patient ID: Belinda Harper, female    DOB: 07-17-1940, 70 y.o.   MRN: [id number]  HPI Patient comes in today for SDA  For acute problem evaluation.  and also poss no worsening  of prev issue. Rash: tender left lateral lower leg for at least a month then recently spread down her leg for the last week or so. No fever  Or new injury but hurts  And tender . No fever boils or rx . No contactants. No new joint swelling. Also anemia may be back  Feels more tired on exertion . Taking iron 2 bid as before.  No incr in hemorrhoid bleeding. No petechia  Has dry cough recently without fever or sweats  Review of Systems No fever hemoptysis uri sx glands Vomiting weight loss syncope. Poss cloudy urine no dysuria   Past history family history social history reviewed in the electronic medical record. Last transfusion was when had surgery / 2010  Has seen Heme in the past last hg was 11 range and was to have fu that didn't happen.  Past history family history social history reviewed in the electronic medical record.     Objective:   Physical Exam  Physical Exam: Vital signs reviewed [id number] is a well-developed well-nourished alert cooperative  white female who appears her stated age in no acute distress.  HEENT: normocephalic  traumatic , Eyes: PERRL EOM's full, conjunctiva clear, Nares: paten,t no deformity discharge or tenderness., Ears: no deformity EAC's clear TMs with normal landmarks. Mouth: clear OP, no lesions, edema.  Moist mucous membranes. Dentition in adequate repair. NECK: supple without masses, thyromegaly or bruits. CHEST/PULM:  Clear to auscultation and percussion breath sounds equal no wheeze , rales or rhonchi. CV: PMI is nondisplaced, S1 S2 no gallops, rubs. Peripheral pulses are full without delay.No JVD .  Faint SEM usb  ABDOMEN: Bowel sounds normal nontender  No guard or rebound, no hepato splenomegal no CVA tenderness.   Extremtities:  No clubbing cyanosis or  edema, no acute joint swelling or redness no focal atrophy NEURO:  Oriented x3, cranial nerves 3-12 appear to be intact, no obvious focal weakness,gait within normal limits no abnormal reflexes or asymmetrical SKIN: normal turgor, color, no bruising or petechiae.RASH:  L;eft distal extremity with non discrete  pinkarea rough area round area and then medial anterior leg blotchy and contiguous somewhat warm and red . No  Abscess or vesicle   Or scaling  Right leg no rash  Left leg ankle  No acute swelling but more swollen cause of  Prev surgery.  PSYCH: Oriented, good eye contact, no obvious depression anxiety, cognition and judgment appear normal. Gait wnl      Assessment & Plan:  Rash doesn't look like abscess or boil  Or mrsa  i suppose could be indolent cellulitis but not severe but spreading.  /get derm consult Dr Terri Piedra  Cough new ? Viral unrevealing exam. Anemia  Hx of severe iron deficiency requiring transfusion   In past ? Cause  Check lab today and then plan fu .

## 2011-02-13 LAB — URINE CULTURE: Colony Count: >=100000

## 2011-02-15 LAB — SEDIMENTATION RATE: Sed Rate: 48 mm/hr — ABNORMAL HIGH (ref 0–22)

## 2011-02-16 MED ORDER — AMOXICILLIN 500 MG PO TABS: 500.0000 mg | ORAL_TABLET | Freq: Three times a day (TID) | ORAL | Status: AC

## 2011-02-16 NOTE — Progress Notes (Signed)
Addended by: Azucena Freed on: 02/16/2011 03:08 PM   Modules accepted: Orders

## 2011-02-16 NOTE — Progress Notes (Signed)
Quick Note:  Pt aware and rx called in to pharmacy. ______

## 2011-02-23 ENCOUNTER — Other Ambulatory Visit: Payer: Self-pay | Admitting: Hematology and Oncology

## 2011-02-23 ENCOUNTER — Encounter (HOSPITAL_BASED_OUTPATIENT_CLINIC_OR_DEPARTMENT_OTHER): Payer: Medicare Other | Admitting: Hematology and Oncology

## 2011-02-23 ENCOUNTER — Telehealth: Payer: Self-pay

## 2011-02-23 DIAGNOSIS — R319 Hematuria, unspecified: Secondary | ICD-10-CM | POA: Insufficient documentation

## 2011-02-23 DIAGNOSIS — Z862 Personal history of diseases of the blood and blood-forming organs and certain disorders involving the immune mechanism: Secondary | ICD-10-CM

## 2011-02-23 LAB — CBC & DIFF AND RETIC
BASO%: 1.3 % (ref 0.0–2.0)
Basophils Absolute: 0 10*3/uL (ref 0.0–0.1)
EOS%: 3.9 % (ref 0.0–7.0)
Eosinophils Absolute: 0.1 10*3/uL (ref 0.0–0.5)
HCT: 31.9 % — ABNORMAL LOW (ref 34.8–46.6)
HGB: 9.7 g/dL — ABNORMAL LOW (ref 11.6–15.9)
Immature Retic Fract: 13.6 % — ABNORMAL HIGH (ref 1.60–10.00)
LYMPH%: 39.2 % (ref 14.0–49.7)
MCH: 26.4 pg (ref 25.1–34.0)
MCHC: 30.4 g/dL — ABNORMAL LOW (ref 31.5–36.0)
MCV: 86.9 fL (ref 79.5–101.0)
MONO#: 0.3 10*3/uL (ref 0.1–0.9)
MONO%: 8.2 % (ref 0.0–14.0)
NEUT#: 1.5 10*3/uL (ref 1.5–6.5)
NEUT%: 47.4 % (ref 38.4–76.8)
Platelets: 218 10*3/uL (ref 145–400)
RBC: 3.67 10*6/uL — ABNORMAL LOW (ref 3.70–5.45)
RDW: 17.3 % — ABNORMAL HIGH (ref 11.2–14.5)
Retic %: 2.03 % (ref 0.70–2.10)
Retic Ct Abs: 74.5 10*3/uL (ref 33.70–90.70)
WBC: 3.1 10*3/uL — ABNORMAL LOW (ref 3.9–10.3)
lymph#: 1.2 10*3/uL (ref 0.9–3.3)

## 2011-02-23 LAB — URINALYSIS, MICROSCOPIC - CHCC
Bilirubin (Urine): NEGATIVE
Glucose: NEGATIVE g/dL
Ketones: NEGATIVE mg/dL
Nitrite: NEGATIVE
Protein: NEGATIVE mg/dL
Specific Gravity, Urine: 1.03 (ref 1.003–1.035)
pH: 6 (ref 4.6–8.0)

## 2011-02-23 LAB — LACTATE DEHYDROGENASE: LDH: 203 U/L (ref 94–250)

## 2011-02-23 LAB — COMPREHENSIVE METABOLIC PANEL
ALT: 12 U/L (ref 0–35)
AST: 22 U/L (ref 0–37)
Albumin: 3.6 g/dL (ref 3.5–5.2)
Alkaline Phosphatase: 79 U/L (ref 39–117)
BUN: 27 mg/dL — ABNORMAL HIGH (ref 6–23)
CO2: 33 mEq/L — ABNORMAL HIGH (ref 19–32)
Calcium: 11.1 mg/dL — ABNORMAL HIGH (ref 8.4–10.5)
Chloride: 100 mEq/L (ref 96–112)
Creatinine, Ser: 0.81 mg/dL (ref 0.50–1.10)
Glucose, Bld: 114 mg/dL — ABNORMAL HIGH (ref 70–99)
Potassium: 4.6 mEq/L (ref 3.5–5.3)
Sodium: 139 mEq/L (ref 135–145)
Total Bilirubin: 0.2 mg/dL — ABNORMAL LOW (ref 0.3–1.2)
Total Protein: 7.2 g/dL (ref 6.0–8.3)

## 2011-02-23 NOTE — Telephone Encounter (Signed)
-----   Message ----- From: Louis Meckel, MD Sent: 02/16/2011 1:50 PM To: Merri Ray, CMA  Robin, Please schedule capsule study ----- Message ----- From: Yancey Flemings, MD Sent: 02/16/2011 10:36 AM To: Lorretta Harp, Louis Meckel, MD  Burna Mortimer,  Actually, a primary patient of Dr. Arlyce Dice. Possibly, a repeat capsule endoscopy. We can leave this to him. I will copy him on this correspondence.  John ----- Message ----- From: Simmie Davies Panosh Sent: 02/15/2011 7:02 PM To: Yancey Flemings, MD  Please review ? Can you suggest any other test to define her possible gi Blood loss \?  Thanks wp     Left message for pt to call back.    Attached Reports     The sender attached the following reports to this message:

## 2011-02-24 NOTE — Telephone Encounter (Signed)
Called pt to schedule capsule endo. 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 Pt to call back when ready to schedule.

## 2011-02-24 NOTE — Telephone Encounter (Signed)
Left message for pt to call back  °

## 2011-02-25 ENCOUNTER — Telehealth: Payer: Self-pay | Admitting: Hematology and Oncology

## 2011-02-25 ENCOUNTER — Other Ambulatory Visit: Payer: Self-pay | Admitting: Hematology and Oncology

## 2011-02-25 ENCOUNTER — Encounter (HOSPITAL_BASED_OUTPATIENT_CLINIC_OR_DEPARTMENT_OTHER): Payer: Medicare Other | Admitting: Hematology and Oncology

## 2011-02-25 DIAGNOSIS — D509 Iron deficiency anemia, unspecified: Secondary | ICD-10-CM

## 2011-02-25 LAB — PROTEIN ELECTROPHORESIS, SERUM, WITH REFLEX
Albumin ELP: 55.5 % — ABNORMAL LOW (ref 55.8–66.1)
Alpha-1-Globulin: 5.1 % — ABNORMAL HIGH (ref 2.9–4.9)
Alpha-2-Globulin: 10.3 % (ref 7.1–11.8)
Beta 2: 4 % (ref 3.2–6.5)
Beta Globulin: 5.5 % (ref 4.7–7.2)
Gamma Globulin: 19.6 % — ABNORMAL HIGH (ref 11.1–18.8)
Total Protein, Serum Electrophoresis: 7.1 g/dL (ref 6.0–8.3)

## 2011-02-25 LAB — IRON AND TIBC
%SAT: 6 % — ABNORMAL LOW (ref 20–55)
Iron: 20 ug/dL — ABNORMAL LOW (ref 42–145)
TIBC: 349 ug/dL (ref 250–470)
UIBC: 329 ug/dL (ref 125–400)

## 2011-02-25 LAB — VITAMIN B12: Vitamin B-12: 525 pg/mL (ref 211–911)

## 2011-02-25 LAB — DIRECT ANTIGLOBULIN TEST (NOT AT ARMC)
DAT (Complement): NEGATIVE
DAT IgG: NEGATIVE

## 2011-02-25 LAB — HAPTOGLOBIN: Haptoglobin: 152 mg/dL (ref 30–200)

## 2011-02-25 LAB — FERRITIN: Ferritin: 20 ng/mL (ref 10–291)

## 2011-02-25 LAB — ERYTHROPOIETIN: Erythropoietin: 72 m[IU]/mL — ABNORMAL HIGH (ref 2.6–34.0)

## 2011-02-25 NOTE — Telephone Encounter (Signed)
gve the pt her nov,dec 2012 appt calendar °

## 2011-02-25 NOTE — Progress Notes (Signed)
This encounter was created in error - please disregard.

## 2011-03-04 ENCOUNTER — Ambulatory Visit (HOSPITAL_BASED_OUTPATIENT_CLINIC_OR_DEPARTMENT_OTHER): Payer: Medicare Other

## 2011-03-04 VITALS — BP 151/74 | HR 61 | Temp 97.9°F

## 2011-03-04 DIAGNOSIS — D509 Iron deficiency anemia, unspecified: Secondary | ICD-10-CM

## 2011-03-04 MED ORDER — DIPHENHYDRAMINE HCL 25 MG PO TABS
50.0000 mg | ORAL_TABLET | Freq: Once | ORAL | Status: AC
  Administered 2011-03-04: 50 mg via ORAL
  Filled 2011-03-04: qty 2

## 2011-03-04 MED ORDER — SODIUM CHLORIDE 0.9 % IJ SOLN
3.0000 mL | Freq: Once | INTRAMUSCULAR | Status: AC | PRN
  Administered 2011-03-04: 3 mL via INTRAVENOUS
  Filled 2011-03-04: qty 10

## 2011-03-04 MED ORDER — ACETAMINOPHEN 325 MG PO TABS
650.0000 mg | ORAL_TABLET | Freq: Four times a day (QID) | ORAL | Status: DC | PRN
  Administered 2011-03-04: 650 mg via ORAL

## 2011-03-04 MED ORDER — SODIUM CHLORIDE 0.9 % IV SOLN
50.0000 mg | Freq: Once | INTRAVENOUS | Status: AC
  Administered 2011-03-04: 50 mg via INTRAVENOUS
  Filled 2011-03-04: qty 1

## 2011-03-04 MED ORDER — SODIUM CHLORIDE 0.9 % IV SOLN
1350.0000 mg | Freq: Once | INTRAVENOUS | Status: AC
  Administered 2011-03-04: 1350 mg via INTRAVENOUS
  Filled 2011-03-04: qty 27

## 2011-04-04 ENCOUNTER — Other Ambulatory Visit: Payer: Self-pay | Admitting: Hematology and Oncology

## 2011-04-04 ENCOUNTER — Other Ambulatory Visit (HOSPITAL_BASED_OUTPATIENT_CLINIC_OR_DEPARTMENT_OTHER): Payer: Medicare Other | Admitting: Lab

## 2011-04-04 DIAGNOSIS — Z862 Personal history of diseases of the blood and blood-forming organs and certain disorders involving the immune mechanism: Secondary | ICD-10-CM

## 2011-04-04 LAB — BASIC METABOLIC PANEL
BUN: 25 mg/dL — ABNORMAL HIGH (ref 6–23)
CO2: 30 mEq/L (ref 19–32)
Calcium: 9.6 mg/dL (ref 8.4–10.5)
Chloride: 100 mEq/L (ref 96–112)
Creatinine, Ser: 0.81 mg/dL (ref 0.50–1.10)
Glucose, Bld: 88 mg/dL (ref 70–99)
Potassium: 3.9 mEq/L (ref 3.5–5.3)
Sodium: 138 mEq/L (ref 135–145)

## 2011-04-04 LAB — IRON AND TIBC
%SAT: 18 % — ABNORMAL LOW (ref 20–55)
Iron: 49 ug/dL (ref 42–145)
TIBC: 279 ug/dL (ref 250–470)
UIBC: 230 ug/dL (ref 125–400)

## 2011-04-04 LAB — FERRITIN: Ferritin: 294 ng/mL — ABNORMAL HIGH (ref 10–291)

## 2011-04-04 LAB — CBC WITH DIFFERENTIAL/PLATELET
BASO%: 0.9 % (ref 0.0–2.0)
Basophils Absolute: 0 10*3/uL (ref 0.0–0.1)
EOS%: 4.2 % (ref 0.0–7.0)
Eosinophils Absolute: 0.2 10*3/uL (ref 0.0–0.5)
HCT: 30.1 % — ABNORMAL LOW (ref 34.8–46.6)
HGB: 9.9 g/dL — ABNORMAL LOW (ref 11.6–15.9)
LYMPH%: 33 % (ref 14.0–49.7)
MCH: 28.9 pg (ref 25.1–34.0)
MCHC: 32.9 g/dL (ref 31.5–36.0)
MCV: 88 fL (ref 79.5–101.0)
MONO#: 0.3 10*3/uL (ref 0.1–0.9)
MONO%: 7.8 % (ref 0.0–14.0)
NEUT#: 2 10*3/uL (ref 1.5–6.5)
NEUT%: 54.1 % (ref 38.4–76.8)
Platelets: 183 10*3/uL (ref 145–400)
RBC: 3.42 10*6/uL — ABNORMAL LOW (ref 3.70–5.45)
RDW: 18.6 % — ABNORMAL HIGH (ref 11.2–14.5)
WBC: 3.7 10*3/uL — ABNORMAL LOW (ref 3.9–10.3)
lymph#: 1.2 10*3/uL (ref 0.9–3.3)

## 2011-04-05 ENCOUNTER — Telehealth: Payer: Self-pay | Admitting: Hematology and Oncology

## 2011-04-05 ENCOUNTER — Ambulatory Visit (HOSPITAL_BASED_OUTPATIENT_CLINIC_OR_DEPARTMENT_OTHER): Payer: Medicare Other | Admitting: Physician Assistant

## 2011-04-05 VITALS — BP 129/65 | HR 59 | Temp 97.5°F | Ht 68.0 in | Wt 153.7 lb

## 2011-04-05 DIAGNOSIS — D509 Iron deficiency anemia, unspecified: Secondary | ICD-10-CM

## 2011-04-05 NOTE — Progress Notes (Signed)
CC:   Belinda Harper. Belinda Sharp, MD Belinda Harper. Belinda Dice, MD,FACG  IDENTIFYING STATEMENT:  [name] is a 70 year old white female with iron-deficiency anemia.  INTERIM HISTORY:  [name] reports since her last clinic visit on February 25, 2011, she did receive intravenous iron in the form of Infed on March 04, 2011, receiving 1350 mg.  Since that time, she does continue to report some mild fatigue but is able to complete ADLs.  She has had no fevers, chills, or night sweats.  No dyspnea or cough.  She reports normal appetite and is having no problems with nausea, vomiting, abdominal pain, or diarrhea.  She does note occasional constipation and also states that she has hemorrhoids as a result of this.  She has had no rectal bleeding.  Also of note, she was advised by Dr. Dalene Harper to follow up with Dr. Arlyce Harper for re-evaluation.  However, she has not schedule this appointment yet and states that she does plan to schedule this appointment on her own.  She has had no issues with dysuria, urinary frequency, or hematuria.  No alteration in sensation or balance, or problems with swelling of extremities.  Current medications are reviewed and recorded.  Also of note, the patient states that she had an increased calcium level after her last clinic visit, but she attributes this to taking increased doses of vitamin D although she has been off of these increased doses as well as all off of her calcium over the past month and plans to resume her calcium today.  PHYSICAL EXAM:  Vital Signs:  Temperature is 97.5, heart rate 59, respirations 18, blood pressure 129/65, weight 153.7 pounds.  General: This is a well-developed, well-nourished, white female, in no acute distress.  HEENT:  Sclerae are nonicteric.  There is no oral thrush mucositis.  Skin:  No rashes or lesions.  Lymph:  No cervical, supraclavicular, axillary, or inguinal lymphadenopathy.  Cardiac: Regular rate and rhythm without murmurs or  gallops.  Peripheral pulses are 2+.  Chest:  Lungs clear to auscultation.  Abdomen:  Positive bowel sounds.  Soft, nontender, nondistended.  No organomegaly.  Extremities: Without edema, cyanosis, or calf tenderness.  Neuro:  Alert and oriented x3.  Strength, sensation, and coordination are all grossly intact.  LABS:  Laboratory data from April 04, 2011:  CBC with diff reveals white blood count of 3.7, hemoglobin 9.9, hematocrit of 30.1, platelets of 183, ANC of 2.0, and MCV of 88.  Chemistries reveal a sodium of 138, potassium 3.9, chloride of 100, BUN of 25, creatinine of 0.81, glucose of 88, calcium of 9.6.  Iron studies reveal a percent saturation of 18, ferritin of 294, iron of 49, TIBC of 279, and UIBC of 230.  IMPRESSION/PLAN: 1. Belinda Harper is a 70 year old white female with iron deficiency     anemia.  She recently received intravenous iron in the form of     INFeD on March 04, 2011, receiving 1350 mg at that time, with     improvement and normalization of her iron levels as well as     adequate iron stores.  Labs were reviewed with Dr. Dalene Harper and per     Dr. Dalene Harper the patient will be scheduled for followup visit in 6     month's time.  A few days before, this we will reassess CBC with     diff, BMET, ferritin, iron IBC.  The patient will also tentatively     be scheduled for intravenous iron the  same day as MD visit.     However, this time, we will schedule her for Feraheme 1020 mg. 2. The patient had increased calcium on her labs back at the end of     October 2012 with a calcium of 11.1.  She was taking higher doses     of vitamin D and calcium at that time but has been off of these     medications.  Her calcium has normalized and she plans to resume     her calcium with vitamin D at her usual scheduled doses as     prescribed by Dr. Fabian Harper.  She also is scheduled for followup with     Dr. Fabian Harper in March 2013 and will have repeat labs at that visit. 3. The patient  also was advised again to follow up with Dr. Arlyce Harper as     she states that her last GI followup was greater than 5 years ago.     We did offer to schedule this appointment for the patient; however,     she states that she will schedule the followup appointment with Dr.     Arlyce Harper.    ______________________________ Belinda Banker, MSN, ANP, BC RJ/MEDQ  D:  04/05/2011  T:  04/05/2011  Job:  [id number]

## 2011-04-05 NOTE — Telephone Encounter (Signed)
gve the pt her June 2013 appt calendar 

## 2011-04-05 NOTE — Progress Notes (Signed)
This office note has been dictated.

## 2011-04-07 ENCOUNTER — Ambulatory Visit: Payer: Medicare Other | Admitting: Physician Assistant

## 2011-04-07 ENCOUNTER — Ambulatory Visit: Payer: Medicare Other | Admitting: Hematology and Oncology

## 2011-04-13 ENCOUNTER — Encounter: Payer: Self-pay | Admitting: Family Medicine

## 2011-04-13 ENCOUNTER — Ambulatory Visit (INDEPENDENT_AMBULATORY_CARE_PROVIDER_SITE_OTHER): Payer: Medicare Other | Admitting: Family Medicine

## 2011-04-13 VITALS — BP 140/78 | HR 77 | Temp 98.7°F | Wt 153.0 lb

## 2011-04-13 DIAGNOSIS — N390 Urinary tract infection, site not specified: Secondary | ICD-10-CM

## 2011-04-13 LAB — POCT URINALYSIS DIPSTICK
Glucose, UA: NEGATIVE
Ketones, UA: NEGATIVE
Nitrite, UA: NEGATIVE
Spec Grav, UA: 1.015
Urobilinogen, UA: 0.2
pH, UA: 7.5

## 2011-04-13 MED ORDER — NITROFURANTOIN MONOHYD MACRO 100 MG PO CAPS: 100.0000 mg | ORAL_CAPSULE | Freq: Two times a day (BID) | ORAL | Status: AC

## 2011-04-13 NOTE — Progress Notes (Signed)
Addended by: Aniceto Boss A on: 04/13/2011 04:32 PM   Modules accepted: Orders

## 2011-04-13 NOTE — Progress Notes (Signed)
  Subjective:    Patient ID: Belinda Harper, female    DOB: Aug 24, 1940, 70 y.o.   MRN: [id number]  HPI Here for 6 days of urgency to urinate with burning. No fever or nausea. Drinking water and cranberry juice.    Review of Systems  Constitutional: Negative.   Respiratory: Negative.   Cardiovascular: Negative.   Gastrointestinal: Negative.   Genitourinary: Positive for dysuria and urgency. Negative for flank pain, difficulty urinating and pelvic pain.       Objective:   Physical Exam  Constitutional: She appears well-developed and well-nourished.  Abdominal: Soft. Bowel sounds are normal. She exhibits no distension and no mass. There is no tenderness. There is no rebound and no guarding.          Assessment & Plan:  Treat with Macrobid. Culture is pending.

## 2011-04-16 LAB — URINE CULTURE: Colony Count: >=100000

## 2011-04-18 NOTE — Progress Notes (Signed)
Quick Note:  Left voice message ______ 

## 2011-06-03 ENCOUNTER — Telehealth: Payer: Self-pay | Admitting: *Deleted

## 2011-06-03 NOTE — Telephone Encounter (Signed)
Tried to call pt about the dexa results. They are same as last time. Continue the same calcium and vitamin d. Left message to call back.

## 2011-06-03 NOTE — Telephone Encounter (Signed)
Pt aware of results 

## 2011-06-08 ENCOUNTER — Encounter: Payer: Self-pay | Admitting: Internal Medicine

## 2011-08-03 ENCOUNTER — Encounter: Payer: Self-pay | Admitting: Internal Medicine

## 2011-08-03 ENCOUNTER — Ambulatory Visit (INDEPENDENT_AMBULATORY_CARE_PROVIDER_SITE_OTHER): Payer: Medicare Other | Admitting: Internal Medicine

## 2011-08-03 VITALS — BP 120/72 | HR 72 | Temp 98.3°F | Ht 68.0 in | Wt 154.0 lb

## 2011-08-03 DIAGNOSIS — M1990 Unspecified osteoarthritis, unspecified site: Secondary | ICD-10-CM

## 2011-08-03 DIAGNOSIS — K649 Unspecified hemorrhoids: Secondary | ICD-10-CM

## 2011-08-03 DIAGNOSIS — M4120 Other idiopathic scoliosis, site unspecified: Secondary | ICD-10-CM

## 2011-08-03 DIAGNOSIS — E559 Vitamin D deficiency, unspecified: Secondary | ICD-10-CM

## 2011-08-03 DIAGNOSIS — J309 Allergic rhinitis, unspecified: Secondary | ICD-10-CM

## 2011-08-03 DIAGNOSIS — R5381 Other malaise: Secondary | ICD-10-CM

## 2011-08-03 DIAGNOSIS — Z0000 Encounter for general adult medical examination without abnormal findings: Secondary | ICD-10-CM

## 2011-08-03 DIAGNOSIS — M810 Age-related osteoporosis without current pathological fracture: Secondary | ICD-10-CM

## 2011-08-03 DIAGNOSIS — Z9181 History of falling: Secondary | ICD-10-CM

## 2011-08-03 DIAGNOSIS — E7800 Pure hypercholesterolemia, unspecified: Secondary | ICD-10-CM

## 2011-08-03 DIAGNOSIS — R5383 Other fatigue: Secondary | ICD-10-CM

## 2011-08-03 DIAGNOSIS — M419 Scoliosis, unspecified: Secondary | ICD-10-CM

## 2011-08-03 DIAGNOSIS — M2170 Unequal limb length (acquired), unspecified site: Secondary | ICD-10-CM

## 2011-08-03 DIAGNOSIS — D509 Iron deficiency anemia, unspecified: Secondary | ICD-10-CM

## 2011-08-03 LAB — CBC WITH DIFFERENTIAL/PLATELET
Basophils Absolute: 0 10*3/uL (ref 0.0–0.1)
Basophils Relative: 1.3 % (ref 0.0–3.0)
Eosinophils Absolute: 0.2 10*3/uL (ref 0.0–0.7)
Eosinophils Relative: 6 % — ABNORMAL HIGH (ref 0.0–5.0)
HCT: 28.6 % — ABNORMAL LOW (ref 36.0–46.0)
Hemoglobin: 9.2 g/dL — ABNORMAL LOW (ref 12.0–15.0)
Lymphocytes Relative: 37 % (ref 12.0–46.0)
Lymphs Abs: 1 10*3/uL (ref 0.7–4.0)
MCHC: 32.2 g/dL (ref 30.0–36.0)
MCV: 84.1 fl (ref 78.0–100.0)
Monocytes Absolute: 0.2 10*3/uL (ref 0.1–1.0)
Monocytes Relative: 7.5 % (ref 3.0–12.0)
Neutro Abs: 1.3 10*3/uL — ABNORMAL LOW (ref 1.4–7.7)
Neutrophils Relative %: 48.2 % (ref 43.0–77.0)
Platelets: 211 10*3/uL (ref 150.0–400.0)
RBC: 3.4 Mil/uL — ABNORMAL LOW (ref 3.87–5.11)
RDW: 16.8 % — ABNORMAL HIGH (ref 11.5–14.6)
WBC: 2.7 10*3/uL — ABNORMAL LOW (ref 4.5–10.5)

## 2011-08-03 LAB — LIPID PANEL
Cholesterol: 215 mg/dL — ABNORMAL HIGH (ref 0–200)
HDL: 72.2 mg/dL (ref >39.00)
Total CHOL/HDL Ratio: 3
Triglycerides: 44 mg/dL (ref 0.0–149.0)
VLDL: 8.8 mg/dL (ref 0.0–40.0)

## 2011-08-03 LAB — BASIC METABOLIC PANEL
BUN: 21 mg/dL (ref 6–23)
CO2: 29 mEq/L (ref 19–32)
Calcium: 9.4 mg/dL (ref 8.4–10.5)
Chloride: 106 mEq/L (ref 96–112)
Creatinine, Ser: 0.7 mg/dL (ref 0.4–1.2)
GFR: 86.62 mL/min (ref >60.00)
Glucose, Bld: 83 mg/dL (ref 70–99)
Potassium: 4 mEq/L (ref 3.5–5.1)
Sodium: 145 mEq/L (ref 135–145)

## 2011-08-03 LAB — HEPATIC FUNCTION PANEL
ALT: 14 U/L (ref 0–35)
AST: 22 U/L (ref 0–37)
Albumin: 4.2 g/dL (ref 3.5–5.2)
Alkaline Phosphatase: 67 U/L (ref 39–117)
Bilirubin, Direct: 0 mg/dL (ref 0.0–0.3)
Total Bilirubin: 0.1 mg/dL — ABNORMAL LOW (ref 0.3–1.2)
Total Protein: 7.3 g/dL (ref 6.0–8.3)

## 2011-08-03 LAB — IBC PANEL
Iron: 23 ug/dL — ABNORMAL LOW (ref 42–145)
Saturation Ratios: 6.6 % — ABNORMAL LOW (ref 20.0–50.0)
Transferrin: 250.6 mg/dL (ref 212.0–360.0)

## 2011-08-03 LAB — TSH: TSH: 0.77 u[IU]/mL (ref 0.35–5.50)

## 2011-08-03 LAB — LDL CHOLESTEROL, DIRECT: Direct LDL: 130 mg/dL

## 2011-08-03 NOTE — Patient Instructions (Addendum)
Will notify you  of labs when available.  We can refer you to Dr. Darrick Penna about your left foot scoliosis and falling history.  Or you can see a different podiatrist.  Encourage you to go back and see Dr. Arlyce Dice and ask all the questions we discussed.  You may not need to take so much calcium. Better to get it in foods; yogurt should not increase her cholesterol risk.  I would avoid saturated fats and processed foods breads and pastries.

## 2011-08-04 ENCOUNTER — Encounter: Payer: Self-pay | Admitting: Gastroenterology

## 2011-08-04 LAB — PTH, INTACT AND CALCIUM
Calcium, Total (PTH): 9.5 mg/dL (ref 8.4–10.5)
PTH: 26.6 pg/mL (ref 14.0–72.0)

## 2011-08-04 LAB — VITAMIN D 25 HYDROXY (VIT D DEFICIENCY, FRACTURES): Vit D, 25-Hydroxy: 86 ng/mL (ref 30–89)

## 2011-08-07 ENCOUNTER — Encounter: Payer: Self-pay | Admitting: Internal Medicine

## 2011-08-07 DIAGNOSIS — Z0000 Encounter for general adult medical examination without abnormal findings: Secondary | ICD-10-CM | POA: Insufficient documentation

## 2011-08-07 DIAGNOSIS — M419 Scoliosis, unspecified: Secondary | ICD-10-CM | POA: Insufficient documentation

## 2011-08-07 DIAGNOSIS — Z9181 History of falling: Secondary | ICD-10-CM | POA: Insufficient documentation

## 2011-08-07 DIAGNOSIS — M2170 Unequal limb length (acquired), unspecified site: Secondary | ICD-10-CM | POA: Insufficient documentation

## 2011-08-07 NOTE — Assessment & Plan Note (Signed)
Continuing  Has avoided going back to gi   recently though advised  . doesn't think will have much to offer.  Encouraged to do so anyway

## 2011-08-07 NOTE — Progress Notes (Signed)
Subjective:    Patient ID: Belinda Harper, female    DOB: Apr 30, 1940, 71 y.o.   MRN: [id number]  HPI Patient comes in today for preventive visit and follow-up of medical issues. Update  history since  last visit:  Anemia: has had iron infusions and still anemic   Has some bleeding hem sh thinks are related . advised to see Gi but unsure they would be of help with past eval unrevealing .  [facility]  : taking calcium and vit d high dose .  Left foot  Problematic   hxof fx left ankle has scoliosis and shoes  Adapted  To adjust for LL discrepancy .   Has seen  One podiatrist who disc surgery and she is not interested . Hard to  Move forward with exercise .   Thinking of second opinion.   LIPIDS  Eats pretty healthy   Has hx of intermittent falling for years  Poss related to her scoliosis and LL denies syncope nad neuro sx with this.     Review of Systems ROS:  GEN/ HEENT: No fever, significant weight changes sweats headaches vision problems hearing changes, CV/ PULM; No chest pain cough, syncope,edema  change in exercise tolerance. GI /GU: No adominal pain, vomiting, change in bowel habit No significant GU symptoms. SKIN/HEME: ,no acute skin rashes suspicious lesions or bleeding. No lymphadenopathy, nodules, masses.  NEURO/ PSYCH:  No neurologic signs such as weakness numbness. Nonew  depression anxiety. IMM/ Allergy: No unusual infections.  Allergy .   REST of 12 system review negative except as per HPI  Past history family history social history reviewed in the electronic medical record.  Outpatient Prescriptions Prior to Visit  Medication Sig Dispense Refill  . Ascorbic Acid (VITAMIN C) 250 MG tablet Take 250 mg by mouth daily.        . calcium carbonate 200 MG capsule Take 250 mg by mouth 3 (three) times daily.       . ferrous gluconate (FERGON) 246 (28 FE) MG tablet Take 246 mg by mouth daily with breakfast. 27mg  6 a day      . Nutritional Supplements (JUICE PLUS FIBRE PO) Take  by mouth.        . vitamin D, CHOLECALCIFEROL, 400 UNITS tablet Taking 2000 iu per day?          Hearing:  Ok   Vision:  No limitations at present . Glasses gets eye check   Safety:  Has smoke detector and wears seat belts.  No firearms. No excess sun exposure. Sees dentist regularly.  Falls:  ocassional see hpi   Advance directive :  Reviewed   Memory: Felt to be good  , no concern from her or her family.  Depression: No anhedonia unusual crying or depressive symptoms  Nutrition: Eats well balanced diet; adequate calcium and vitamin D. No swallowing chewiing problems.  Injury: no major injuries in the last six months.  Other healthcare providers:  Reviewed today .  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   Preventive parameters: up-to-date on colonoscopy, mammogram, immunizations.   ADLS:   There are no problems or need for assistance  driving, feeding, obtaining food, dressing, toileting and bathing, managing money using phone. She is independent.  EXERCISE: water aerobic free weights and walking  30 - 60 minutes  2 x per week        Objective:   Physical Exam BP 120/72  Pulse 72  Temp(Src) 98.3 F (36.8 C) (  Oral)  Ht 5\' 8"  (1.727 m)  Wt 154 lb (69.854 kg)  BMI 23.42 kg/m2  SpO2 98% Physical Exam: Vital signs reviewed [id number] is a well-developed well-nourished alert cooperative  white female who appears her stated age in no acute distress.  HEENT: normocephalic atraumatic , Eyes: PERRL EOM's full, conjunctiva clear, Nares: paten,t no deformity discharge or tenderness., Ears: no deformity EAC's clear TMs with normal landmarks. Mouth: clear OP, no lesions, edema.  Moist mucous membranes. Dentition in adequate repair. NECK: supple without masses, thyromegaly or bruits. CHEST/PULM:  Clear to auscultation and percussion breath sounds equal no wheeze , rales or rhonchi. No chest wall deformities or tenderness. Breast: normal by inspection . No dimpling, discharge,  masses, tenderness or discharge .  CV: PMI is nondisplaced, S1 S2 1/6 sem lsb non radiating gallops,, rubs. Peripheral pulses are full without delay.No JVD .  ABDOMEN: Bowel sounds normal nontender  No guard or rebound, no hepato splenomegal no CVA tenderness.  No hernia. Extremtities:  No clubbing cyanosis or edema, no acute joint swelling or redness no focal atrophy left ankle dec rom  Nl pulse  Back scoliosis  NEURO:  Oriented x3, cranial nerves 3-12 appear to be intact, no obvious focal weakness,gait within normal limits no abnormal reflexes  SKIN: No acute rashes normal turgor, color, no bruising or petechiae. PSYCH: Oriented, good eye contact, no obvious depression anxiety, cognition and judgment appear normal. LN: no cervical axillary inguinal adenopathy      Assessment & Plan:  Preventive Health Care Counseled regarding healthy nutrition, exercise, sleep, injury prevention, calcium vit d and healthy weight . She may be taking too much calcium at this time and can back off on this but stay on vit d    Hx Severe iron deficiency anemia with hx of transfusion and iron infusion and barely keeping up . Has had eval and capsule endo in past  With neg dx  But her last colon was 2008  . hesitant to fu with gi but encouraged her to do this anyway . To see Dr Etta Quill in June .  Foot pain and scoliosis leg length discrep   Hx of ankle fracture .  We disc this for a while and unsure what would help . She is not excited about a surgical solution.   I am concerned however by her hx of falling  And potential for sig injury. Dont see  Other causes .  Disc seeing Dr Darrick Penna to see if he can offer  Any suggestions about her predicament and fall  Prevention interventions .   Hx of elevated lipids: she is concerned about this  And avoiding dairy  . Encouraged her to  Eat  yogurt and low fat dairy to help her bone health and preferred over supplements    That this is not a sig CV risk for her.   Prolonged  counseling discussion  At  visit today in regard to  Problem based   Issues

## 2011-08-22 ENCOUNTER — Ambulatory Visit (AMBULATORY_SURGERY_CENTER): Payer: Medicare Other | Admitting: *Deleted

## 2011-08-22 VITALS — Ht 68.0 in | Wt 154.0 lb

## 2011-08-22 DIAGNOSIS — Z1211 Encounter for screening for malignant neoplasm of colon: Secondary | ICD-10-CM

## 2011-08-22 MED ORDER — PEG-KCL-NACL-NASULF-NA ASC-C 100 G PO SOLR: ORAL | Status: DC

## 2011-08-29 ENCOUNTER — Ambulatory Visit (INDEPENDENT_AMBULATORY_CARE_PROVIDER_SITE_OTHER): Payer: Medicare Other | Admitting: Sports Medicine

## 2011-08-29 VITALS — BP 152/76 | Ht 68.0 in | Wt 155.0 lb

## 2011-08-29 DIAGNOSIS — R200 Anesthesia of skin: Secondary | ICD-10-CM

## 2011-08-29 DIAGNOSIS — R269 Unspecified abnormalities of gait and mobility: Secondary | ICD-10-CM

## 2011-08-29 DIAGNOSIS — M25579 Pain in unspecified ankle and joints of unspecified foot: Secondary | ICD-10-CM

## 2011-08-29 DIAGNOSIS — R209 Unspecified disturbances of skin sensation: Secondary | ICD-10-CM

## 2011-08-29 NOTE — Assessment & Plan Note (Signed)
She currently has correction in the shoe.  We will plan on adding an orthotic to give more correction.  We have given her a HEP as well as an ankle sleeve (left ankle)  to help improve her proprioception.

## 2011-08-29 NOTE — Assessment & Plan Note (Signed)
It is unlikely that treatment will restore the sensation that has already been lost.  Adding more correction with an orthotic should add support, decrease strain, and stop or slow progression of further loss.

## 2011-08-29 NOTE — Progress Notes (Signed)
  Subjective:    Patient ID: Belinda Harper, female    DOB: Sep 15, 1940, 71 y.o.   MRN: [id number]  HPI 71 y/o female is here with balance issues.  She trips often.  She has actually broken her ankle during a fall.  When she walks she goes to the left.  At home she wears a shoe on the left foot and none on the right. Otherwise she will have left hip pain.  This is her version of barefoot walking.    She was diagnosed with scoliosis at age 23.  She had been wearing lifts but now she has her shoes built up to correct the difference.  She has been doing it this way for 30 years.      For the past 3-4 years she has noticed that the left 3rd-5th  toes feel like "something is wrapped around them".  This feeling is consistent, with or without shoes.  No previous treatments.     Review of Systems     Objective:   Physical Exam  Back is non-tender to palpation With forward flexion the scoliotic curvature is well compensated  There is a hump on the right with forward flexion There is about a one inch difference in height between the left and right hemipelvis while standing The SI joints are tight, left greater than right FABER on the left is limited compared to the right Negative SLR Right leg measures 99 cm Left leg measures 98 cm While lying down there is a significant leg length discrepancy which does not correct with sitting up  The feet have loss of the transverse arch bilaterally There are bilateral bunions,  Right greater than left The 3rd and 4th toes are deviated medially on both feet On the right foot the second toe does not touch the ground when standing There is decreased sensation to light touch on the dorsal and ventral surface of the 3rd -5th toes on the left foot.  This decrease in sensation goes to about 1 cm proximally on the foot. The rear foot is neutral with standing  Gait: She veers to the left with walking barefoot.  This is somewhat less when walking in shoes.  She is  unable to balance on one foot with either foot without assistance.      Assessment & Plan:

## 2011-08-29 NOTE — Patient Instructions (Signed)
1. Do your balance exercises 1-2 times daily.  2. Follow up with [place] in one month.  3. Drop off your inserts sometime before your next visit.  4. At your next visit please bring in a selection of shoes that you wear most often.  5. We will plan to make orthotics for you at your next visit.

## 2011-08-30 ENCOUNTER — Ambulatory Visit (AMBULATORY_SURGERY_CENTER): Payer: Medicare Other | Admitting: Gastroenterology

## 2011-08-30 ENCOUNTER — Encounter: Payer: Self-pay | Admitting: Gastroenterology

## 2011-08-30 VITALS — BP 136/61 | HR 57 | Temp 98.1°F | Resp 14 | Ht 68.0 in | Wt 154.0 lb

## 2011-08-30 DIAGNOSIS — K648 Other hemorrhoids: Secondary | ICD-10-CM

## 2011-08-30 DIAGNOSIS — D509 Iron deficiency anemia, unspecified: Secondary | ICD-10-CM

## 2011-08-30 DIAGNOSIS — Z1211 Encounter for screening for malignant neoplasm of colon: Secondary | ICD-10-CM

## 2011-08-30 DIAGNOSIS — K5730 Diverticulosis of large intestine without perforation or abscess without bleeding: Secondary | ICD-10-CM

## 2011-08-30 MED ORDER — SODIUM CHLORIDE 0.9 % IV SOLN: 500.0000 mL | INTRAVENOUS | Status: DC

## 2011-08-30 NOTE — Patient Instructions (Signed)
YOU HAD AN ENDOSCOPIC PROCEDURE TODAY AT THE [facility]: Refer to the procedure report that was given to you for any specific questions about what was found during the examination.  If the procedure report does not answer your questions, please call your gastroenterologist to clarify.  If you requested that your care partner not be given the details of your procedure findings, then the procedure report has been included in a sealed envelope for you to review at your convenience later.  YOU SHOULD EXPECT: Some feelings of bloating in the abdomen. Passage of more gas than usual.  Walking can help get rid of the air that was put into your GI tract during the procedure and reduce the bloating. If you had a lower endoscopy (such as a colonoscopy or flexible sigmoidoscopy) you may notice spotting of blood in your stool or on the toilet paper. If you underwent a bowel prep for your procedure, then you may not have a normal bowel movement for a few days.  DIET: Your first meal following the procedure should be a light meal and then it is ok to progress to your normal diet.  A half-sandwich or bowl of soup is an example of a good first meal.  Heavy or fried foods are harder to digest and may make you feel nauseous or bloated.  Likewise meals heavy in dairy and vegetables can cause extra gas to form and this can also increase the bloating.  Drink plenty of fluids but you should avoid alcoholic beverages for 24 hours.  ACTIVITY: Your care partner should take you home directly after the procedure.  You should plan to take it easy, moving slowly for the rest of the day.  You can resume normal activity the day after the procedure however you should NOT DRIVE or use heavy machinery for 24 hours (because of the sedation medicines used during the test).    SYMPTOMS TO REPORT IMMEDIATELY: A gastroenterologist can be reached at any hour.  During normal business hours, 8:30 AM to 5:00 PM Monday through Friday,  call [phone].  After hours and on weekends, please call the GI answering service at [phone] who will take a message and have the physician on call contact you.   Following lower endoscopy (colonoscopy or flexible sigmoidoscopy):  Excessive amounts of blood in the stool  Significant tenderness or worsening of abdominal pains  Swelling of the abdomen that is new, acute  Fever of 100F or higher  Following upper endoscopy (EGD)  Vomiting of blood or coffee ground material  New chest pain or pain under the shoulder blades  Painful or persistently difficult swallowing  New shortness of breath  Fever of 100F or higher  Black, tarry-looking stools  FOLLOW UP: If any biopsies were taken you will be contacted by phone or by letter within the next 1-3 weeks.  Call your gastroenterologist if you have not heard about the biopsies in 3 weeks.  Our staff will call the home number listed on your records the next business day following your procedure to check on you and address any questions or concerns that you may have at that time regarding the information given to you following your procedure. This is a courtesy call and so if there is no answer at the home number and we have not heard from you through the emergency physician on call, we will assume that you have returned to your regular daily activities without incident.  SIGNATURES/CONFIDENTIALITY: You and/or your care   partner have signed paperwork which will be entered into your electronic medical record.  These signatures attest to the fact that that the information above on your After Visit Summary has been reviewed and is understood.  Full responsibility of the confidentiality of this discharge information lies with you and/or your care-partner.  

## 2011-08-30 NOTE — Progress Notes (Signed)
Patient did not experience any of the following events: a burn prior to discharge; a fall within the facility; wrong site/side/patient/procedure/implant event; or a hospital transfer or hospital admission upon discharge from the facility. (G8907) Patient did not have preoperative order for IV antibiotic SSI prophylaxis. (G8918)  

## 2011-08-30 NOTE — Op Note (Signed)
[facility] [address] [place], [place]  [address]  COLONOSCOPY PROCEDURE REPORT  PATIENT:  Belinda, Harper  MR#:  [id number] BIRTHDATE:  1940/11/04, 70 yrs. old  GENDER:  female ENDOSCOPIST:  [name], MD REF. BY: PROCEDURE DATE:  08/30/2011 PROCEDURE:  Diagnostic Colonoscopy ASA CLASS:  Class II INDICATIONS:  Iron deficiency anemia MEDICATIONS:   MAC sedation, administered by CRNA propofol 270mg IV  DESCRIPTION OF PROCEDURE:   After the risks benefits and alternatives of the procedure were thoroughly explained, informed consent was obtained.  Digital rectal exam was performed and revealed no abnormalities.   The LB CF-[id number] endoscope was introduced through the anus and advanced to the cecum, which was identified by both the appendix and ileocecal valve, without limitations.  The quality of the prep was excellent, using MoviPrep.  The instrument was then slowly withdrawn as the colon was fully examined. <<PROCEDUREIMAGES>>  FINDINGS:  Severe diverticulosis was found in the sigmoid colon (see image5).  Internal Hemorrhoids were found (see image6).  This was otherwise a normal examination of the colon (see image1 and image2).   Retroflexed views in the rectum revealed internal hemorrhoids.    The time to cecum =  1) 7.75  minutes. The scope was then withdrawn in  1) 6.50  minutes from the cecum and the procedure completed. COMPLICATIONS:  None ENDOSCOPIC IMPRESSION: 1) Severe diverticulosis in the sigmoid colon 2) Internal hemorrhoids 3) Otherwise normal examination  No source for chronic GI blood loss identified  RECOMMENDATIONS: 1) Capsule endoscopy 2) band ligation of hemorrhoids REPEAT EXAM:  No  ______________________________ [name], MD  CC:  Madelin Headings, MD  n. Rosalie DoctorBarbette Hair. Decklin Weddington at 08/30/2011 02:11 PM  Raechel Chute, [phone]

## 2011-08-30 NOTE — Progress Notes (Signed)
The pt tolerated the colonoscopy very well. Maw   

## 2011-08-31 ENCOUNTER — Telehealth: Payer: Self-pay | Admitting: *Deleted

## 2011-08-31 ENCOUNTER — Telehealth: Payer: Self-pay

## 2011-08-31 NOTE — Telephone Encounter (Signed)
  Follow up Call-  Call back number 08/30/2011  Post procedure Call Back phone  # [phone]  Permission to leave phone message Yes     Patient questions:  Do you have a fever, pain , or abdominal swelling? no Pain Score  0 *  Have you tolerated food without any problems? yes  Have you been able to return to your normal activities? yes  Do you have any questions about your discharge instructions: Diet   no Medications  no Follow up visit  no  Do you have questions or concerns about your Care? no  Actions: * If pain score is 4 or above: No action needed, pain <4.

## 2011-08-31 NOTE — Telephone Encounter (Signed)
She has an iron deficiency anemia with negative EGD and colonoscopy.  We're looking for a small bowel bleeding source.

## 2011-08-31 NOTE — Telephone Encounter (Signed)
Spoke with pt and she states she does not want to schedule the capsule endo at this time.

## 2011-08-31 NOTE — Telephone Encounter (Signed)
Called pt to try and schedule capsule endo and hemorrhoid banding. Pt states she does not think the hemorrhoids are bad enough for banding and she does not want to do this. Pt wants to know why she needs the capsule endo before she will decide if she wants to do it or not. Please advise.

## 2011-09-28 ENCOUNTER — Other Ambulatory Visit (HOSPITAL_BASED_OUTPATIENT_CLINIC_OR_DEPARTMENT_OTHER): Payer: Medicare Other

## 2011-09-28 DIAGNOSIS — D509 Iron deficiency anemia, unspecified: Secondary | ICD-10-CM

## 2011-09-28 LAB — CBC WITH DIFFERENTIAL/PLATELET
BASO%: 0.8 % (ref 0.0–2.0)
Basophils Absolute: 0 10*3/uL (ref 0.0–0.1)
EOS%: 3.8 % (ref 0.0–7.0)
Eosinophils Absolute: 0.2 10*3/uL (ref 0.0–0.5)
HCT: 33 % — ABNORMAL LOW (ref 34.8–46.6)
HGB: 10.2 g/dL — ABNORMAL LOW (ref 11.6–15.9)
LYMPH%: 20.7 % (ref 14.0–49.7)
MCH: 26.4 pg (ref 25.1–34.0)
MCHC: 30.9 g/dL — ABNORMAL LOW (ref 31.5–36.0)
MCV: 85.4 fL (ref 79.5–101.0)
MONO#: 0.3 10*3/uL (ref 0.1–0.9)
MONO%: 6.8 % (ref 0.0–14.0)
NEUT#: 3.2 10*3/uL (ref 1.5–6.5)
NEUT%: 67.9 % (ref 38.4–76.8)
Platelets: 190 10*3/uL (ref 145–400)
RBC: 3.87 10*6/uL (ref 3.70–5.45)
RDW: 17.7 % — ABNORMAL HIGH (ref 11.2–14.5)
WBC: 4.8 10*3/uL (ref 3.9–10.3)
lymph#: 1 10*3/uL (ref 0.9–3.3)
nRBC: 0 % (ref 0–0)

## 2011-09-28 LAB — IRON AND TIBC
%SAT: 12 % — ABNORMAL LOW (ref 20–55)
Iron: 42 ug/dL (ref 42–145)
TIBC: 347 ug/dL (ref 250–470)
UIBC: 305 ug/dL (ref 125–400)

## 2011-09-28 LAB — BASIC METABOLIC PANEL
BUN: 24 mg/dL — ABNORMAL HIGH (ref 6–23)
CO2: 29 mEq/L (ref 19–32)
Calcium: 9.6 mg/dL (ref 8.4–10.5)
Chloride: 103 mEq/L (ref 96–112)
Creatinine, Ser: 0.7 mg/dL (ref 0.50–1.10)
Glucose, Bld: 86 mg/dL (ref 70–99)
Potassium: 4.6 mEq/L (ref 3.5–5.3)
Sodium: 138 mEq/L (ref 135–145)

## 2011-09-28 LAB — FERRITIN: Ferritin: 19 ng/mL (ref 10–291)

## 2011-09-29 ENCOUNTER — Other Ambulatory Visit: Payer: Medicare Other

## 2011-10-03 ENCOUNTER — Ambulatory Visit (INDEPENDENT_AMBULATORY_CARE_PROVIDER_SITE_OTHER): Payer: Medicare Other | Admitting: Sports Medicine

## 2011-10-03 DIAGNOSIS — R269 Unspecified abnormalities of gait and mobility: Secondary | ICD-10-CM

## 2011-10-03 NOTE — Progress Notes (Signed)
  Subjective:    Patient ID: Belinda Harper, female    DOB: [birth year], 71 y.o.   MRN: [id number]  HPI 71 y/o female with a significant leg length discrepancy due to scoliosis is here to follow up for orthotics evaluation.  She was given a medial wedge in her main shoes.  She is here today with a sample of her regularly worn shoes for correction.  She thought that the wedge had her walking on the outside of her foot.  It didn't feel very comfortable.   Review of Systems     Objective:   Physical Exam  Gait with the medial wedge is supinated with a swing that favors the shorter left leg This is corrected with the medial wedge. The gait is more neutral and she is able to walk in a relatively straight line.      Assessment & Plan:

## 2011-10-03 NOTE — Progress Notes (Signed)
  Subjective:    Patient ID: Belinda Harper, female    DOB: 02/05/1941, 71 y.o.   MRN: [id number]  HPI 71 y/o female with a significant leg length discrepancy due to scoliosis is here to follow up for orthotics evaluation.  She was given a medial wedge in her main shoes.  She is here today with a sample of her regularly worn shoes for correction.  She thought that the wedge had her walking on the outside of her foot.  It didn't feel very comfortable.    Review of Systems     Objective:   Physical Exam Gait with the medial wedge is supinated with a swing that favors the shorter left leg This is corrected with the medial wedge. The gait is more neutral and she is able to walk in a relatively straight line.             Assessment & Plan:        Assessment & Plan:

## 2011-10-03 NOTE — Assessment & Plan Note (Signed)
We will do a trial of lateral wedges.  If this works for her she will consider having the wedge built in with her shoe correction. We have added this correction to several of her shoes that she wears regularly.

## 2011-10-04 ENCOUNTER — Telehealth: Payer: Self-pay | Admitting: Hematology and Oncology

## 2011-10-04 ENCOUNTER — Ambulatory Visit (HOSPITAL_BASED_OUTPATIENT_CLINIC_OR_DEPARTMENT_OTHER): Payer: Medicare Other | Admitting: Hematology and Oncology

## 2011-10-04 ENCOUNTER — Ambulatory Visit (HOSPITAL_BASED_OUTPATIENT_CLINIC_OR_DEPARTMENT_OTHER): Payer: Medicare Other

## 2011-10-04 ENCOUNTER — Encounter: Payer: Self-pay | Admitting: Hematology and Oncology

## 2011-10-04 VITALS — BP 122/68 | HR 60 | Temp 97.7°F | Ht 68.0 in | Wt 155.2 lb

## 2011-10-04 DIAGNOSIS — D539 Nutritional anemia, unspecified: Secondary | ICD-10-CM

## 2011-10-04 DIAGNOSIS — D509 Iron deficiency anemia, unspecified: Secondary | ICD-10-CM

## 2011-10-04 MED ORDER — SODIUM CHLORIDE 0.9 % IV SOLN
1020.0000 mg | Freq: Once | INTRAVENOUS | Status: AC
  Administered 2011-10-04: 1020 mg via INTRAVENOUS
  Filled 2011-10-04: qty 34

## 2011-10-04 MED ORDER — SODIUM CHLORIDE 0.9 % IV SOLN
Freq: Once | INTRAVENOUS | Status: AC
  Administered 2011-10-04: 14:00:00 via INTRAVENOUS

## 2011-10-04 NOTE — Patient Instructions (Signed)
Belinda Harper  [id number]  [facility] Discharge Instructions  RECOMMENDATIONS MADE BY THE CONSULTANT AND ANY TEST RESULTS WILL BE SENT TO YOUR REFERRING DOCTOR.   EXAM FINDINGS BY MD TODAY AND SIGNS AND SYMPTOMS TO REPORT TO CLINIC OR PRIMARY MD:   Your current list of medications are: Current Outpatient Prescriptions  Medication Sig Dispense Refill  . Ascorbic Acid (VITAMIN C) 250 MG tablet Take 250 mg by mouth 2 (two) times daily.       . Calcium Citrate-Vitamin D (CITRACAL + D PO) Take 500 mg by mouth 2 (two) times daily. Has 250IU of vitamin D3 and 80mg  of magnesium      . docusate sodium (COLACE) 100 MG capsule Take 100 mg by mouth daily.      . ferrous gluconate (FERGON) 246 (28 FE) MG tablet Take 81 mg by mouth 2 (two) times daily. 27mg  6 a day      . Nutritional Supplements (JUICE PLUS FIBRE PO) Take by mouth.        . vitamin D, CHOLECALCIFEROL, 400 UNITS tablet Take 400 Units by mouth daily. Pt takes on cloudy days only.         INSTRUCTIONS GIVEN AND DISCUSSED:   SPECIAL INSTRUCTIONS/FOLLOW-UP:  See above.  I acknowledge that I have been informed and understand all the instructions given to me and received a copy. I do not have any more questions at this time, but understand that I may call the [facility] at [phone] during business hours should I have any further questions or need assistance in obtaining follow-up care.

## 2011-10-04 NOTE — Patient Instructions (Signed)
[facility] Discharge Instructions for Patients Receiving Chemotherapy  Today you received the following chemotherapy agents Feraheme  To help prevent nausea and vomiting after your treatment, we encourage you to take your nausea medication Begin taking it at 7 pm and take it as often as prescribed for the next 24 to 72 hours.   If you develop nausea and vomiting that is not controlled by your nausea medication, call the clinic. If it is after clinic hours your family physician or the after hours number for the clinic or go to the Emergency Department.   BELOW ARE SYMPTOMS THAT SHOULD BE REPORTED IMMEDIATELY:  *FEVER GREATER THAN 100.5 F  *CHILLS WITH OR WITHOUT FEVER  NAUSEA AND VOMITING THAT IS NOT CONTROLLED WITH YOUR NAUSEA MEDICATION  *UNUSUAL SHORTNESS OF BREATH  *UNUSUAL BRUISING OR BLEEDING  TENDERNESS IN MOUTH AND THROAT WITH OR WITHOUT PRESENCE OF ULCERS  *URINARY PROBLEMS  *BOWEL PROBLEMS  UNUSUAL RASH Items with * indicate a potential emergency and should be followed up as soon as possible.  One of the nurses will contact you 24 hours after your treatment. Please let the nurse know about any problems that you may have experienced. Feel free to call the clinic you have any questions or concerns. The clinic phone number is [phone].   I have been informed and understand all the instructions given to me. I know to contact the clinic, my physician, or go to the Emergency Department if any problems should occur. I do not have any questions at this time, but understand that I may call the clinic during office hours   should I have any questions or need assistance in obtaining follow up care.    __________________________________________  _____________  __________ Signature of Patient or Authorized Representative            Date                   Time    __________________________________________ Nurse's Signature    

## 2011-10-04 NOTE — Progress Notes (Signed)
This office note has been dictated.

## 2011-10-04 NOTE — Progress Notes (Signed)
CC:   [name], MD [name], MD,FACG  JUNE 10, 2013IDENTIFYING STATEMENT:  The patient is a 71 year old woman with iron-deficiency anemia who presents for followup.  INTERVAL HISTORY:  Belinda Harper reports fatigue.  Has not noticed any overt blood loss.  She takes [facility] prescription iron with no difficulties. She reports at that Dr. Arlyce Dice performed a colonoscopy on 08/30/2011 that showed severe diverticulosis in the sigmoid colon with internal hemorrhoids.  Otherwise exam was unremarkable.  He recommended a capsule endoscopy and band ligation of hemorrhoids.  Belinda Harper has no plans to follow up as of yet.  LABORATORY DATA:  09/28/2011:  Hemoglobin and hematocrit 10.2 and 33.0. Iron 42, TIBC 347, saturation 12%, ferritin 19 (294).  MEDICATIONS:  Reviewed and updated.  ALLERGIES:  Sulfamethoxazole.  Past medical history, family history and social history unchanged.  REVIEW OF SYSTEMS:  Besides fatigue, 10-review of systems negative.  PHYSICAL EXAMINATION:  The patient is alert, oriented x3.  Vitals: Pulse 60, blood pressure 122/68, temperature 97.7, respirations 18, weight 155.2 pounds.  HEENT:  Head is atraumatic, normocephalic. Sclerae are anicteric.  Mouth moist.  Chest:  Clear.  CVS: Unremarkable.  Abdomen:  Soft, nontender.  Bowel sounds present. Extremities:  No edema.  Lab data as above.  In addition, sodium 138, potassium 4.6, chloride 103, CO2 29, creatinine 0.7, calcium 9.6.  White cell count 4.9, platelets 119.  IMPRESSION/PLAN:  Belinda Harper is a 71 year old woman with iron-deficiency anemia.  Recent colonoscopy noted hemorrhoids and diverticulitis. Capsule endoscopy and band ligation was recommended, but the patient would like to defer for now.  She is severe anemic with ferritin of 19. She will receive IV iron in the form of Feraheme after this visit.  She follows up in 4 months' time.    ______________________________ Laurice Record,  M.D. LIO/MEDQ  D:  10/04/2011  T:  10/04/2011  Job:  [id number]

## 2011-10-04 NOTE — Telephone Encounter (Signed)
Gave pt appt for October 2013 MD and IV iron , draw labs  A few days before MD visit

## 2011-12-05 ENCOUNTER — Ambulatory Visit (INDEPENDENT_AMBULATORY_CARE_PROVIDER_SITE_OTHER): Payer: Medicare Other | Admitting: Family Medicine

## 2011-12-05 ENCOUNTER — Encounter: Payer: Self-pay | Admitting: Family Medicine

## 2011-12-05 VITALS — BP 150/70 | Temp 98.9°F | Wt 152.0 lb

## 2011-12-05 DIAGNOSIS — L03116 Cellulitis of left lower limb: Secondary | ICD-10-CM

## 2011-12-05 DIAGNOSIS — L02419 Cutaneous abscess of limb, unspecified: Secondary | ICD-10-CM

## 2011-12-05 MED ORDER — CEPHALEXIN 500 MG PO CAPS: 500.0000 mg | ORAL_CAPSULE | Freq: Three times a day (TID) | ORAL | Status: DC

## 2011-12-05 NOTE — Patient Instructions (Addendum)

## 2011-12-05 NOTE — Progress Notes (Signed)
  Subjective:    Patient ID: Belinda Harper, female    DOB: [birth year], 71 y.o.   MRN: [id number]  HPI  Acute visit. Puncture wound left leg.  Occurred last Thursday[place] was a nail attached to board below water level. Punctured leg. Last tetanus 2011. Had some soreness afterwards and erythema by later that day which has progressed slightly. No fever. No chills. No nausea or vomiting. No drainage from wound. Allergy to sulfa  Review of Systems  Constitutional: Negative for fever and chills.  Musculoskeletal: Negative for gait problem.       Objective:   Physical Exam  Constitutional: She appears well-developed and well-nourished.  Cardiovascular: Normal rate and regular rhythm.   Pulmonary/Chest: Effort normal and breath sounds normal. No respiratory distress. She has no wheezes. She has no rales.  Skin:       Patient small puncture wound left mid lateral aspect of leg. She has an area of erythema approximately 10 x 15 cm surrounding. No purulence. No fluctuance. No drainage. Slightly tender to palpation          Assessment & Plan:  Cellulitis left leg following puncture wound. Tetanus up-to-date. Keflex 500 mg 3 times a day for 10 days. Elevation. Followup promptly for any worsening symptoms

## 2011-12-11 ENCOUNTER — Emergency Department (HOSPITAL_COMMUNITY)
Admission: EM | Admit: 2011-12-11 | Discharge: 2011-12-12 | Disposition: A | Discharge status: Home / Self Care | Payer: Medicare Other | Attending: Emergency Medicine | Admitting: Emergency Medicine

## 2011-12-11 ENCOUNTER — Encounter (HOSPITAL_COMMUNITY): Payer: Self-pay

## 2011-12-11 DIAGNOSIS — T6391XA Toxic effect of contact with unspecified venomous animal, accidental (unintentional), initial encounter: Secondary | ICD-10-CM | POA: Insufficient documentation

## 2011-12-11 DIAGNOSIS — E559 Vitamin D deficiency, unspecified: Secondary | ICD-10-CM | POA: Insufficient documentation

## 2011-12-11 DIAGNOSIS — E7800 Pure hypercholesterolemia, unspecified: Secondary | ICD-10-CM | POA: Insufficient documentation

## 2011-12-11 DIAGNOSIS — D649 Anemia, unspecified: Secondary | ICD-10-CM | POA: Insufficient documentation

## 2011-12-11 DIAGNOSIS — T63461A Toxic effect of venom of wasps, accidental (unintentional), initial encounter: Secondary | ICD-10-CM | POA: Insufficient documentation

## 2011-12-11 DIAGNOSIS — T63441A Toxic effect of venom of bees, accidental (unintentional), initial encounter: Secondary | ICD-10-CM

## 2011-12-11 DIAGNOSIS — M1990 Unspecified osteoarthritis, unspecified site: Secondary | ICD-10-CM | POA: Insufficient documentation

## 2011-12-11 DIAGNOSIS — M810 Age-related osteoporosis without current pathological fracture: Secondary | ICD-10-CM | POA: Insufficient documentation

## 2011-12-11 MED ORDER — METHYLPREDNISOLONE SODIUM SUCC 125 MG IJ SOLR
125.0000 mg | Freq: Once | INTRAMUSCULAR | Status: AC
  Administered 2011-12-11: 125 mg via INTRAVENOUS
  Filled 2011-12-11: qty 2

## 2011-12-11 MED ORDER — EPINEPHRINE 0.3 MG/0.3ML IJ DEVI: 0.3000 mg | INTRAMUSCULAR | Status: DC | PRN

## 2011-12-11 MED ORDER — DIPHENHYDRAMINE HCL 50 MG/ML IJ SOLN
INTRAMUSCULAR | Status: AC
  Administered 2011-12-11: 21:00:00
  Filled 2011-12-11: qty 1

## 2011-12-11 NOTE — ED Notes (Signed)
Per EMS, pt at home and stung by yellow jacket.  Confirmed stings legs, arms and back.  Pt drove to [employer] to get benadryl and felt light headed.  EMS called.  Vitals:  110/60, hr 80, resp 16, 98%.  Hx:  Anemia, No prior allergy known

## 2011-12-11 NOTE — ED Provider Notes (Signed)
History     CSN: [id number]  Arrival date & time 12/11/11  2101   First MD Initiated Contact with Patient 12/11/11 2107      Chief Complaint  Patient presents with  . Allergic Reaction   HPI  History provided by the patient. Patient is a 71 year old female with no significant PMH who presents with concerns for allergic reaction response to bee stings. Patient reports she was mowing her lawn around 6 PM when she started massive PEs in the ground this formed all around her. She reports having several stings around her right ankle and lower leg area as well as a possible sting to her back. She still has some pain and irritation to her foot and ankle area. Patient states she drove to the grocery store to get some Benadryl and while the checkout line began to feel. Lightheaded and near syncopal. EMS was called and she was brought to the emergency room. She also reports developing a diffuse pruritic rash to the torso and extremities. She denies having any chest tightness, throat tightness or swelling, difficulty breathing or shortness of breath. Patient has no prior history of allergic reaction to bee stings. Patient was treated with IV Benadryl and Zantac by EMS.    Past Medical History  Diagnosis Date  . Unspecified vitamin D deficiency 04/06/2007  . HYPERCHOLESTEROLEMIA 05/19/2008  . 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 HEMORRHOIDS 05/19/2008  . ALLERGIC RHINITIS 01/03/2007  . ASTHMA 01/03/2007  . UTERINE PROLAPSE 04/06/2007  . OSTEOARTHRITIS 01/03/2007  . SHOULDER PAIN, RIGHT 01/27/2010  . OSTEOPOROSIS 01/03/2007  . ORTHOSTATIC DIZZINESS 04/10/2009  . MALAISE AND FATIGUE 01/27/2010  . NUMBNESS 01/27/2010  . CHEST DISCOMFORT 04/06/2007  . Dysuria 04/10/2009  . FRACTURE, ANKLE, LEFT 04/10/2009  . Blood transfusion 12 2010    hg 4 transfused 4 units  . Gastritis 2002  . DEPRESSION 01/03/2007    resolved    Past Surgical History   Procedure Date  . Panendoscopy     small bowel bx  . Ankle fracture surgery 02/2009    left ankle fracture and dislocation  . Cataract extraction w/ intraocular lens implant 2000    right eye  . Orif finger / thumb fracture 1970    reattached    Family History  Problem Relation Age of Onset  . Hyperlipidemia Mother   . Heart disease Mother   . Ovarian cancer Mother   . Uterine cancer Mother   . Arthritis Mother   . Osteoporosis Mother   . Heart disease Father   . Arthritis Father   . Melanoma Father     History  Substance Use Topics  . Smoking status: Never Smoker   . Smokeless tobacco: Never Used  . Alcohol Use: No    OB History    Grav Para Term Preterm Abortions TAB SAB Ect Mult Living                  Review of Systems  Respiratory: Negative for shortness of breath.   Cardiovascular: Negative for chest pain and palpitations.  Gastrointestinal: Negative for nausea and vomiting.  Skin: Positive for rash.  Neurological: Positive for light-headedness.    Allergies  Sulfa antibiotics and Sulfamethoxazole  Home Medications   Current Outpatient Rx  Name Route Sig Dispense Refill  . VITAMIN C 250 MG PO TABS Oral Take 250 mg by mouth 2 (two) times daily.     [name] CITRACAL +  D PO Oral Take 500 mg by mouth 2 (two) times daily. Has 250IU of vitamin D3 and 80mg  of magnesium    . CEPHALEXIN 500 MG PO CAPS Oral Take 500 mg by mouth 3 (three) times daily.    [name] DOCUSATE SODIUM 100 MG PO CAPS Oral Take 100 mg by mouth daily.    [name] FERROUS GLUCONATE IRON 246 (28 FE) MG PO TABS Oral Take 246 mg by mouth 2 (two) times daily. 27mg  6 a day    . JUICE PLUS FIBRE PO Oral Take 1 capsule by mouth 2 (two) times daily.     [name] VITAMIN D 400 UNITS PO TABS Oral Take 400 Units by mouth daily. Pt takes on cloudy days only.      BP 129/51  Pulse 66  Temp 98.2 F (36.8 C) (Oral)  Resp 18  SpO2 100%  Physical Exam  Nursing note and vitals reviewed. Constitutional: She is oriented to  person, place, and time. She appears well-developed and well-nourished. No distress.  HENT:  Head: Normocephalic.  Mouth/Throat: Oropharynx is clear and moist.  Neck: Normal range of motion.  Cardiovascular: Normal rate and regular rhythm.   Pulmonary/Chest: Effort normal and breath sounds normal. No stridor. No respiratory distress. She has no wheezes. She has no rales.  Abdominal: Soft. There is no tenderness. There is no rebound and no guarding.  Neurological: She is alert and oriented to person, place, and time.  Skin: Skin is warm and dry. Rash noted.       Diffuse urticarial type rash to the torso and upper extremities. There are several erythematous maculopapules to the right lower ankle and foot area with slight tenderness. There is no retained stingers.  Psychiatric: She has a normal mood and affect. Her behavior is normal.    ED Course  Procedures     1. Allergic reaction to bee sting       MDM  9:00PM patient seen and evaluated. Patient with normal respirations and O2 sats at this time. Diffuse urticarial rash.    Patient doing much better after medications. Rash is improving. No worsening of symptoms. Patient feels ready to be discharged home. Will prescribe EpiPen.    Angus Seller, [place] 12/12/11 [phone]

## 2011-12-11 NOTE — ED Notes (Signed)
Pt given 50 mg zantac, and 50 mg benadryl in route. Pt also has IV in LAC.

## 2011-12-11 NOTE — ED Notes (Signed)
WUJ:[id number]<JY> Expected date:12/11/11<BR> Expected time: 8:44 PM<BR> Means of arrival:Ambulance<BR> Comments:<BR> Triage, insect stings, allergic reaction.

## 2011-12-12 ENCOUNTER — Telehealth: Payer: Self-pay | Admitting: Internal Medicine

## 2011-12-12 NOTE — Telephone Encounter (Signed)
Caller: [name]/Patient; Patient Name: Belinda Harper; PCP: Madelin Headings.; Best Callback Phone Number: [phone].  Pt seen in the office on 12/05/11 with Dr Caryl Never for nail in the leg.  Pt was given antibiotic (Cephalexin).  Pt states there is still redness around in, but there is no pain. Pt also was stung by yellow jackets on 12/11/11 and seen at [facility].  Pt was told to follow up with doctor.  All emergent symptoms of Abrasions, Lacerations, Puncture Wounds ruled out with exception to 'New signs and symptoms of local infection'.  Home care advice given and appt scheduled for 12/13/11 at 945am with Dr Caryl Never.

## 2011-12-13 ENCOUNTER — Encounter: Payer: Self-pay | Admitting: Family Medicine

## 2011-12-13 ENCOUNTER — Ambulatory Visit (INDEPENDENT_AMBULATORY_CARE_PROVIDER_SITE_OTHER): Payer: Medicare Other | Admitting: Family Medicine

## 2011-12-13 VITALS — BP 124/80 | Temp 98.2°F | Wt 154.0 lb

## 2011-12-13 DIAGNOSIS — Z91030 Bee allergy status: Secondary | ICD-10-CM

## 2011-12-13 DIAGNOSIS — L03119 Cellulitis of unspecified part of limb: Secondary | ICD-10-CM

## 2011-12-13 DIAGNOSIS — T7840XA Allergy, unspecified, initial encounter: Secondary | ICD-10-CM

## 2011-12-13 DIAGNOSIS — L02419 Cutaneous abscess of limb, unspecified: Secondary | ICD-10-CM

## 2011-12-13 NOTE — Progress Notes (Signed)
  Subjective:    Patient ID: Belinda Harper, female    DOB: 05-11-1940, 71 y.o.   MRN: [id number]  HPI  Patient here requesting wound recheck left lower leg. Puncture wound recently from nail. Tetanus up-to-date. She had some cellulitis last visit. Started Keflex 500 mg 3 times a day. She's had less redness. Overall swelling but has indurated area around puncture wound. No recent drainage. No fever or chills. No problems with ambulation.  Patient was mowing yard and had bee sting from yellow jacket. This occurred on Sunday. She developed some low blood pressure generalized hives and mild swelling of lips. Went to emergency room. Symptoms eventually resolved. Patient now has an EpiPen and keeps Benadryl at all times.   Review of Systems  Constitutional: Negative for fever and chills.       Objective:   Physical Exam  Constitutional: She appears well-developed and well-nourished.  Cardiovascular: Normal rate and regular rhythm.   Skin:       Left leg reveals resolving cellulitis changes. No significant erythema or warmth. She has slightly indurated area surrounding puncture wound with area approx 2 x 2 centimeters. No purulent drainage. No fluctuance. Nontender. No foreign bodies palpated          Assessment & Plan:  #1 recent puncture wound left lower leg.  from a nail. We offered x-rays to rule out retained foreign body but suspicion is low and she declines. Suspect this is generalized reaction from recent puncture and her cellulitis is actually greatly improved.  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 She has an EpiPen and Benadryl. She knows to seek medical attention if future sting with yellowjacket

## 2011-12-13 NOTE — Patient Instructions (Addendum)
Keep Epipen and benadryl with you at all times Follow up promptly for any recurrent redness or increased swelling of leg.

## 2011-12-14 NOTE — ED Provider Notes (Signed)
Medical screening examination/treatment/procedure(s) were performed by non-physician practitioner and as supervising physician I was immediately available for consultation/collaboration.  Geoffery Lyons, MD 12/14/11 [phone]

## 2012-01-18 ENCOUNTER — Other Ambulatory Visit (HOSPITAL_BASED_OUTPATIENT_CLINIC_OR_DEPARTMENT_OTHER): Payer: Medicare Other | Admitting: Lab

## 2012-01-18 DIAGNOSIS — D539 Nutritional anemia, unspecified: Secondary | ICD-10-CM

## 2012-01-18 LAB — BASIC METABOLIC PANEL
BUN: 25 mg/dL — ABNORMAL HIGH (ref 6–23)
CO2: 29 mEq/L (ref 19–32)
Calcium: 9.8 mg/dL (ref 8.4–10.5)
Chloride: 102 mEq/L (ref 96–112)
Creatinine, Ser: 0.83 mg/dL (ref 0.50–1.10)
Glucose, Bld: 110 mg/dL — ABNORMAL HIGH (ref 70–99)
Potassium: 3.8 mEq/L (ref 3.5–5.3)
Sodium: 140 mEq/L (ref 135–145)

## 2012-01-18 LAB — CBC WITH DIFFERENTIAL/PLATELET
BASO%: 1.1 % (ref 0.0–2.0)
Basophils Absolute: 0 10*3/uL (ref 0.0–0.1)
EOS%: 2.9 % (ref 0.0–7.0)
Eosinophils Absolute: 0.1 10*3/uL (ref 0.0–0.5)
HCT: 31.6 % — ABNORMAL LOW (ref 34.8–46.6)
HGB: 10.1 g/dL — ABNORMAL LOW (ref 11.6–15.9)
LYMPH%: 23 % (ref 14.0–49.7)
MCH: 28.8 pg (ref 25.1–34.0)
MCHC: 32.1 g/dL (ref 31.5–36.0)
MCV: 90 fL (ref 79.5–101.0)
MONO#: 0.2 10*3/uL (ref 0.1–0.9)
MONO%: 6.2 % (ref 0.0–14.0)
NEUT#: 2.4 10*3/uL (ref 1.5–6.5)
NEUT%: 66.8 % (ref 38.4–76.8)
Platelets: 250 10*3/uL (ref 145–400)
RBC: 3.51 10*6/uL — ABNORMAL LOW (ref 3.70–5.45)
RDW: 16.5 % — ABNORMAL HIGH (ref 11.2–14.5)
WBC: 3.6 10*3/uL — ABNORMAL LOW (ref 3.9–10.3)
lymph#: 0.8 10*3/uL — ABNORMAL LOW (ref 0.9–3.3)

## 2012-01-18 LAB — IRON AND TIBC
%SAT: 9 % — ABNORMAL LOW (ref 20–55)
Iron: 29 ug/dL — ABNORMAL LOW (ref 42–145)
TIBC: 338 ug/dL (ref 250–470)
UIBC: 309 ug/dL (ref 125–400)

## 2012-01-18 LAB — FERRITIN: Ferritin: 24 ng/mL (ref 10–291)

## 2012-01-18 LAB — VITAMIN B12: Vitamin B-12: 495 pg/mL (ref 211–911)

## 2012-01-18 LAB — FOLATE: Folate: >20 ng/mL

## 2012-01-24 ENCOUNTER — Other Ambulatory Visit: Payer: Self-pay | Admitting: Family

## 2012-01-24 ENCOUNTER — Ambulatory Visit (HOSPITAL_BASED_OUTPATIENT_CLINIC_OR_DEPARTMENT_OTHER): Payer: Medicare Other

## 2012-01-24 ENCOUNTER — Telehealth: Payer: Self-pay | Admitting: *Deleted

## 2012-01-24 ENCOUNTER — Ambulatory Visit (HOSPITAL_BASED_OUTPATIENT_CLINIC_OR_DEPARTMENT_OTHER): Payer: Medicare Other | Admitting: Family

## 2012-01-24 ENCOUNTER — Encounter: Payer: Self-pay | Admitting: Family

## 2012-01-24 VITALS — BP 134/67 | HR 59 | Temp 97.6°F | Resp 20 | Ht 68.0 in | Wt 153.0 lb

## 2012-01-24 DIAGNOSIS — D539 Nutritional anemia, unspecified: Secondary | ICD-10-CM

## 2012-01-24 DIAGNOSIS — D509 Iron deficiency anemia, unspecified: Secondary | ICD-10-CM

## 2012-01-24 DIAGNOSIS — M810 Age-related osteoporosis without current pathological fracture: Secondary | ICD-10-CM

## 2012-01-24 MED ORDER — SODIUM CHLORIDE 0.9 % IV SOLN
Freq: Once | INTRAVENOUS | Status: AC
  Administered 2012-01-24: 15:00:00 via INTRAVENOUS

## 2012-01-24 MED ORDER — SODIUM CHLORIDE 0.9 % IV SOLN
1020.0000 mg | Freq: Once | INTRAVENOUS | Status: AC
  Administered 2012-01-24: 1020 mg via INTRAVENOUS
  Filled 2012-01-24: qty 34

## 2012-01-24 NOTE — Progress Notes (Signed)
Patient ID: Belinda Harper, female   DOB: 07-Apr-1941, 71 y.o.   MRN: [id number] CSN: [id number]  CC: Belinda Harper. Belinda Sharp, MD  Belinda Harper. Belinda Dice, MD,FACG   Identifying Statement: Belinda Harper is a 71 y.o. Caucasian female with a history of iron deficiency anemia who presents for follow-up.  Interval History: Belinda Harper is a 71 year-old female with iron-deficiency anemia who presents for follow up.  Since the patient's last office visit on 10/04/2011, the patient reports that she has experienced fatigue. She was in the ED on 12/11/2011 for an allergic reaction to a bee sting. The patient has a history of internal hemorrhoids and notes occasional hemorrhoidal bleeding.  She denies melena or bloody stools.  The patient sees a Solicitor and they have been discussing surgical intervention.  The patient denies any other symptomatology including PICA, SOB, chills, fever, unusual bleeding, N/V/D but does have occasional constipation.  The patient takes a stool softener daily and will add Metamucil in addition to increased hydration to her bowel regimen.  She continues to take Fergon PO twice daily.  Belinda Harper last received a Feraheme infusion on 10/04/2011.    Medications: Current Outpatient Prescriptions  Medication Sig Dispense Refill  . Ascorbic Acid (VITAMIN C) 250 MG tablet Take 250 mg by mouth 2 (two) times daily.       . Calcium Citrate-Vitamin D (CITRACAL + D PO) Take 500 mg by mouth 2 (two) times daily. Has 250IU of vitamin D3 and 80mg  of magnesium      . docusate sodium (COLACE) 100 MG capsule Take 100 mg by mouth daily.      [name] EPINEPHrine (EPIPEN) 0.3 mg/0.3 mL DEVI Inject 0.3 mLs (0.3 mg total) into the muscle as needed.  1 Device  0  . ferrous gluconate (FERGON) 246 (28 FE) MG tablet Take 246 mg by mouth 2 (two) times daily. 27mg  6 a day      . Nutritional Supplements (JUICE PLUS FIBRE PO) Take 1 capsule by mouth 2 (two) times daily.       . vitamin D, CHOLECALCIFEROL, 400 UNITS tablet Take  400 Units by mouth daily. Pt takes on cloudy days only.        Allergies: Allergies  Allergen Reactions  . Other     Bee sting  . Sulfa Antibiotics   . Sulfamethoxazole     REACTION: unspecified    Past Medical History: Past Medical History  Diagnosis Date  . Unspecified vitamin D deficiency 04/06/2007  . HYPERCHOLESTEROLEMIA 05/19/2008  . 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 HEMORRHOIDS 05/19/2008  . ALLERGIC RHINITIS 01/03/2007  . ASTHMA 01/03/2007  . UTERINE PROLAPSE 04/06/2007  . OSTEOARTHRITIS 01/03/2007  . SHOULDER PAIN, RIGHT 01/27/2010  . OSTEOPOROSIS 01/03/2007  . ORTHOSTATIC DIZZINESS 04/10/2009  . MALAISE AND FATIGUE 01/27/2010  . NUMBNESS 01/27/2010  . CHEST DISCOMFORT 04/06/2007  . Dysuria 04/10/2009  . FRACTURE, ANKLE, LEFT 04/10/2009  . Blood transfusion 12 2010    hg 4 transfused 4 units  . Gastritis 2002  . DEPRESSION 01/03/2007    resolved     Family History: Family History  Problem Relation Age of Onset  . Hyperlipidemia Mother   . Heart disease Mother   . Ovarian cancer Mother   . Uterine cancer Mother   . Arthritis Mother   . Osteoporosis Mother   . Heart disease Father   . Arthritis Father   . Melanoma Father  Social History: History  Substance Use Topics  . Smoking status: Never Smoker   . Smokeless tobacco: Never Used  . Alcohol Use: No    Review of Systems: 10 point review of systems was completed and is negative except as noted above.   Physical Exam: Blood pressure 134/67, pulse 59, temperature 97.6 F (36.4 C), temperature source Oral, resp. rate 20, height 5\' 8"  (1.727 m), weight 153 lb (69.4 kg).  General appearance: Alert, cooperative, well nourished, no apparent distress Head: Normocephalic, without obvious abnormality, atraumatic Eyes: Conjunctivae/corneas clear. PERRLA, EOMI Nose: Nares, septum and mucosa are normal, no drainage or sinus tenderness Neck:  No adenopathy, supple, symmetrical, trachea midline, thyroid not enlarged, no tenderness Resp: clear to auscultation bilaterally Cardio: Regular rate and rhythm, S1, S2 normal, no murmur, click, rub or gallop GI: Soft, non-tender, hypoactive bowel sounds, no organomegaly Extremities: Extremities normal, atraumatic, no cyanosis or edema Skin: Varicosities - lower leg(s) bilateral Lymph nodes: Cervical, supraclavicular, and axillary nodes normal Neurologic: Grossly normal   Laboratory Data: Sodium 140, potassium 3.8, chloride 102, CO2 29, BUN 25, creatinine 0.83, calcium 9.8, glucose 110, iron 29, UIBC 309, TIBC 338, % saturation 9, ferritin 24, Folate less than 20.0, vitamin B12 495, WBC 3.6, RBC 3.51, hemoglobin 10.1, hematocrit 31.6, MCV 90.0, platelets 250, ANC 2.4   Impression/Plan: Belinda Harper is a 71 year old female with iron-deficiency anemia. She is severely anemic with Ferritin of 24.   She will receive an IV infusion of Feraheme 1020 mg.  We plan to see Belinda Harper again in 4 months' time for an office visit with laboratories (CMP, CBC, Iron/TIBC, Ferritin, Folate and B12) to be drawn prior to the office visit.  She will continue taking Fergon twice daily.  The patient will contact us in the interim with any questions or concerns.  Discussed this patient with Dr. Dalene Carrow.   [name], NP-C 01/24/2012, 1:20 PM

## 2012-01-24 NOTE — Telephone Encounter (Signed)
Per staff message and POF I have scheduled appt.  JMW  

## 2012-01-24 NOTE — Patient Instructions (Addendum)
Continue taking oral iron Increase your oral fluid intake Consider using Metamucil or Miralax for constipation

## 2012-01-25 ENCOUNTER — Other Ambulatory Visit: Payer: Self-pay | Admitting: Family

## 2012-04-28 ENCOUNTER — Encounter: Payer: Self-pay | Admitting: Oncology

## 2012-04-28 ENCOUNTER — Telehealth: Payer: Self-pay | Admitting: *Deleted

## 2012-04-28 NOTE — Telephone Encounter (Signed)
Per patient reassignment I have moved treatment to follow APP visit.  JMW

## 2012-04-28 NOTE — Telephone Encounter (Signed)
S/w the pt and she is aware of the transitioning from dr odogwu to dr Cyndie Chime. Pt is aware that she will receive a f/u letter in the mail with this information and the appt calendar of the changes.

## 2012-05-09 ENCOUNTER — Other Ambulatory Visit: Payer: Self-pay | Admitting: Internal Medicine

## 2012-05-09 ENCOUNTER — Telehealth: Payer: Self-pay | Admitting: Internal Medicine

## 2012-05-09 MED ORDER — PERMETHRIN 5 % EX CREA: TOPICAL_CREAM | Freq: Once | CUTANEOUS | Status: DC

## 2012-05-09 NOTE — Telephone Encounter (Signed)
Pt grandsons was dx with scabies and prescribed permethrin 5% cream. Pt does not have any symptoms and would like the same medication call into [facility] [phone]

## 2012-05-09 NOTE — Telephone Encounter (Signed)
Spoke to the pt.  She has no signs or sx of scabies.  Sent in new rx to the pharmacy.

## 2012-05-09 NOTE — Telephone Encounter (Signed)
If she was exposed ok to do this   Please ask if any sx.

## 2012-05-28 ENCOUNTER — Other Ambulatory Visit (HOSPITAL_BASED_OUTPATIENT_CLINIC_OR_DEPARTMENT_OTHER): Payer: Medicare Other | Admitting: Lab

## 2012-05-28 DIAGNOSIS — D539 Nutritional anemia, unspecified: Secondary | ICD-10-CM

## 2012-05-28 DIAGNOSIS — D509 Iron deficiency anemia, unspecified: Secondary | ICD-10-CM

## 2012-05-28 LAB — CBC WITH DIFFERENTIAL/PLATELET
BASO%: 1.7 % (ref 0.0–2.0)
Basophils Absolute: 0 10*3/uL (ref 0.0–0.1)
EOS%: 6.5 % (ref 0.0–7.0)
Eosinophils Absolute: 0.2 10*3/uL (ref 0.0–0.5)
HCT: 30.6 % — ABNORMAL LOW (ref 34.8–46.6)
HGB: 9.7 g/dL — ABNORMAL LOW (ref 11.6–15.9)
LYMPH%: 29.6 % (ref 14.0–49.7)
MCH: 28.1 pg (ref 25.1–34.0)
MCHC: 31.7 g/dL (ref 31.5–36.0)
MCV: 88.5 fL (ref 79.5–101.0)
MONO#: 0.2 10*3/uL (ref 0.1–0.9)
MONO%: 8 % (ref 0.0–14.0)
NEUT#: 1.5 10*3/uL (ref 1.5–6.5)
NEUT%: 54.2 % (ref 38.4–76.8)
Platelets: 222 10*3/uL (ref 145–400)
RBC: 3.45 10*6/uL — ABNORMAL LOW (ref 3.70–5.45)
RDW: 16 % — ABNORMAL HIGH (ref 11.2–14.5)
WBC: 2.7 10*3/uL — ABNORMAL LOW (ref 3.9–10.3)
lymph#: 0.8 10*3/uL — ABNORMAL LOW (ref 0.9–3.3)

## 2012-05-28 LAB — IRON AND TIBC
%SAT: 6 % — ABNORMAL LOW (ref 20–55)
Iron: 20 ug/dL — ABNORMAL LOW (ref 42–145)
TIBC: 326 ug/dL (ref 250–470)
UIBC: 306 ug/dL (ref 125–400)

## 2012-05-28 LAB — FOLATE: Folate: >20 ng/mL

## 2012-05-28 LAB — COMPREHENSIVE METABOLIC PANEL (CC13)
ALT: 13 U/L (ref 0–55)
AST: 23 U/L (ref 5–34)
Albumin: 3.5 g/dL (ref 3.5–5.0)
Alkaline Phosphatase: 82 U/L (ref 40–150)
BUN: 19.3 mg/dL (ref 7.0–26.0)
CO2: 29 mEq/L (ref 22–29)
Calcium: 9.5 mg/dL (ref 8.4–10.4)
Chloride: 103 mEq/L (ref 98–107)
Creatinine: 0.8 mg/dL (ref 0.6–1.1)
Glucose: 99 mg/dl (ref 70–99)
Potassium: 3.7 mEq/L (ref 3.5–5.1)
Sodium: 142 mEq/L (ref 136–145)
Total Bilirubin: <0.2 mg/dL (ref 0.20–1.20)
Total Protein: 7.3 g/dL (ref 6.4–8.3)

## 2012-05-28 LAB — FERRITIN: Ferritin: 30 ng/mL (ref 10–291)

## 2012-05-28 LAB — VITAMIN B12: Vitamin B-12: 667 pg/mL (ref 211–911)

## 2012-05-29 ENCOUNTER — Other Ambulatory Visit: Payer: Medicare Other | Admitting: Lab

## 2012-05-30 ENCOUNTER — Other Ambulatory Visit: Payer: Medicare Other | Admitting: Lab

## 2012-05-30 ENCOUNTER — Ambulatory Visit (HOSPITAL_BASED_OUTPATIENT_CLINIC_OR_DEPARTMENT_OTHER): Payer: Medicare Other | Admitting: Nurse Practitioner

## 2012-05-30 ENCOUNTER — Ambulatory Visit: Payer: Medicare Other | Admitting: Hematology and Oncology

## 2012-05-30 ENCOUNTER — Other Ambulatory Visit: Payer: Self-pay | Admitting: Nurse Practitioner

## 2012-05-30 ENCOUNTER — Ambulatory Visit (HOSPITAL_BASED_OUTPATIENT_CLINIC_OR_DEPARTMENT_OTHER): Payer: Medicare Other

## 2012-05-30 VITALS — BP 117/46 | HR 67 | Temp 98.4°F | Resp 18 | Ht 68.0 in | Wt 156.9 lb

## 2012-05-30 DIAGNOSIS — D509 Iron deficiency anemia, unspecified: Secondary | ICD-10-CM

## 2012-05-30 DIAGNOSIS — D649 Anemia, unspecified: Secondary | ICD-10-CM

## 2012-05-30 DIAGNOSIS — R319 Hematuria, unspecified: Secondary | ICD-10-CM

## 2012-05-30 LAB — HM MAMMOGRAPHY

## 2012-05-30 MED ORDER — SODIUM CHLORIDE 0.9 % IV SOLN
1020.0000 mg | Freq: Once | INTRAVENOUS | Status: AC
  Administered 2012-05-30: 1020 mg via INTRAVENOUS
  Filled 2012-05-30: qty 34

## 2012-05-30 MED ORDER — SODIUM CHLORIDE 0.9 % IV SOLN: Freq: Once | INTRAVENOUS | Status: DC

## 2012-05-30 NOTE — Progress Notes (Signed)
OFFICE PROGRESS NOTE  Interval history:  Ms. Bove is a 72 year old woman previously followed by Dr. Dalene Carrow for iron deficiency anemia. She is seen today to establish care with Dr. Cyndie Chime.  She was initially seen by Dr. Dalene Carrow in 2006. Bone marrow biopsy showed a normocellular marrow with trilineage hematopoiesis. Iron stores were absent. 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 She received IV iron. She was discharged back to her primary care physician on 04/01/2005 on oral iron at which time her hemoglobin was 12.5, white count 3.2 and platelet count 259,000.  In November 2010 she fractured her ankle and underwent surgery. She reports being placed on a daily aspirin. She was hospitalized on 04/10/2009 with anemia presenting with a hemoglobin of 4.4. She was having black stools. She underwent an upper endoscopy and was found to have tiny erosions in the antrum felt unlikely to explain the drop in her hemoglobin. There was no active bleeding or blood seen at that time.  She had a colonoscopy on 08/30/2011 that showed severe diverticulosis and internal hemorrhoids. She reports a capsule endoscopy was done in 2008.  She was referred back to Dr. Dalene Carrow in November 2012 with progressive anemia. On 02/23/2011 her hemoglobin was 9.7, white count 3.1, absolute neutrophil count 1.5, platelet count 218,000. Ferritin returned at 20, iron 20, percent saturation 6. B12 was normal on 525, erythropoeitin level elevated at 72, haptoglobin 152. BUN 27, creatinine 0.81. SPEP was negative for an M spike. There was nonspecific diffuse polyclonal type increase in gamma globulins. Urinalysis showed trace blood.  She received Infed on 03/04/2011. Followup CBC on 04/04/2011 showed a hemoglobin of 9.9, white count 3.7, platelet count 183,000, iron 49, percent saturation 18 and ferritin 294. Labs on 09/28/2011 showed a hemoglobin of 10.2, MCV  84, white count 4.8, platelet count 190,000, iron 42, percent saturation 12 and ferritin 19. She received Feraheme 1020 mg on 10/04/2011. Followup CBC on 01/18/2012 showed a hemoglobin of 10.1, white count 3.6, platelet count 250,000, iron 29, percent saturation 9, ferritin 24. She received Feraheme again on 01/24/2012.  Most recent labs done 05/28/2012 showed a hemoglobin of 9.7, MCV 88, white count 2.7, platelet count 222,000, ferritin 30, iron 20 and percent saturation 6.  She continues oral iron. Stools are dark due to iron. She intermittently notes rectal bleeding due to hemorrhoids. She denies hematuria. No vaginal bleeding. Energy level is low. She has occasional dyspnea on exertion. No chest pain.  She has osteopenia. She denies any other health problems  Medications reviewed.  She is allergic to sulfa.  Mother deceased with emphysema, asthma. Father deceased with MI, Alzheimer's. Brother committed suicide at age 27.  She lives in [place]. She is divorced. She has 2 healthy children. She is retired from [employer] work. No tobacco or alcohol use. No chemical or radiation exposure.  Objective: Blood pressure 117/46, pulse 67, temperature 98.4 F (36.9 C), resp. rate 18, height 5\' 8"  (1.727 m), weight 156 lb 14.4 oz (71.169 kg).  Oropharynx is without thrush or ulceration. No palpable cervical, supraclavicular or axillary lymph nodes. Lungs are clear. No wheezes or rales. Regular cardiac rhythm. Abdomen is soft and nontender. No organomegaly. Extremities are without edema. Motor strength 5 over 5. Vibratory sense intact over the fingertips per tuning fork exam. No rectal mass. Stool negative for occult blood.  Lab Results: Lab Results  Component Value Date   WBC 2.7* 05/28/2012   HGB 9.7* 05/28/2012  HCT 30.6* 05/28/2012   MCV 88.5 05/28/2012   PLT 222 05/28/2012    Chemistry:    Chemistry      Component Value Date/Time   NA 142 05/28/2012 1600   NA 140 01/18/2012 1641   K 3.7  05/28/2012 1600   K 3.8 01/18/2012 1641   CL 103 05/28/2012 1600   CL 102 01/18/2012 1641   CO2 29 05/28/2012 1600   CO2 29 01/18/2012 1641   BUN 19.3 05/28/2012 1600   BUN 25* 01/18/2012 1641   CREATININE 0.8 05/28/2012 1600   CREATININE 0.83 01/18/2012 1641      Component Value Date/Time   CALCIUM 9.5 05/28/2012 1600   CALCIUM 9.8 01/18/2012 1641   CALCIUM 9.5 08/03/2011 1210   ALKPHOS 82 05/28/2012 1600   ALKPHOS 67 08/03/2011 1210   AST 23 05/28/2012 1600   AST 22 08/03/2011 1210   ALT 13 05/28/2012 1600   ALT 14 08/03/2011 1210   BILITOT <0.20 05/28/2012 1600   BILITOT 0.1* 08/03/2011 1210       Studies/Results: No results found.  Medications: I have reviewed the patient's current medications.  Assessment/Plan:  1. Normocytic anemia with trilineage hematopoiesis and absent iron stores on bone marrow biopsy 08/13/2004. 2. Mild leukopenia. 3. Hospitalization December 2010 with severe anemia requiring transfusion support. EGD at that time showed tiny erosions in the antrum felt unlikely to explain the drop in her hemoglobin. 4. Colonoscopy 08/30/2011 with findings of severe diverticulosis and internal hemorrhoids. 5. Patient report of capsule endoscopy in 2008 (we do not have the report).  Disposition-Ms. Bezek has received intravenous iron intermittently over the past 1+ year with no significant change in her hemoglobin. She has a stable mild leukopenia.  On recent labs on 05/28/2012 the ferritin was in low normal range. Dr. Cyndie Chime recommends proceeding with [facility] today as scheduled. We are also obtaining additional labs to include a reticulocyte count, sedimentation rate, ANA, soluble transferrin receptor and urinalysis.  We will have her return in approximately 8 weeks for followup labs and an office visit. If the hemoglobin is not better at that time she will likely need another bone marrow biopsy.  She is agreeable with the above plan. She will contact the office prior to her next visit  with any problems.  Patient seen with Dr. Cyndie Chime.  Lonna Cobb ANP/GNP-BC

## 2012-05-30 NOTE — Patient Instructions (Addendum)
Ferumoxytol injection What is this medicine? FERUMOXYTOL is an iron complex. Iron is used to make healthy red blood cells, which carry oxygen and nutrients throughout the body. This medicine is used to treat iron deficiency anemia in people with chronic kidney disease. This medicine may be used for other purposes; ask your health care provider or pharmacist if you have questions. What should I tell my health care provider before I take this medicine? They need to know if you have any of these conditions: -anemia not caused by low iron levels -high levels of iron in the blood -magnetic resonance imaging (MRI) test scheduled -an unusual or allergic reaction to iron, other medicines, foods, dyes, or preservatives -pregnant or trying to get pregnant -breast-feeding How should I use this medicine? This medicine is for infusion into a vein. It is given by a health care professional in a hospital or clinic setting. Talk to your pediatrician regarding the use of this medicine in children. Special care may be needed. Overdosage: If you think you've taken too much of this medicine contact a poison control center or emergency room at once. Overdosage: If you think you have taken too much of this medicine contact a poison control center or emergency room at once. NOTE: This medicine is only for you. Do not share this medicine with others. What if I miss a dose? It is important not to miss your dose. Call your doctor or health care professional if you are unable to keep an appointment. What may interact with this medicine? This medicine may interact with the following medications: -other iron products This list may not describe all possible interactions. Give your health care provider a list of all the medicines, herbs, non-prescription drugs, or dietary supplements you use. Also tell them if you smoke, drink alcohol, or use illegal drugs. Some items may interact with your medicine. What should I watch  for while using this medicine? Visit your doctor or healthcare professional regularly. Tell your doctor or healthcare professional if your symptoms do not start to get better or if they get worse. You may need blood work done while you are taking this medicine. You may need to follow a special diet. Talk to your doctor. Foods that contain iron include: whole grains/cereals, dried fruits, beans, or peas, leafy green vegetables, and organ meats (liver, kidney). What side effects may I notice from receiving this medicine? Side effects that you should report to your doctor or health care professional as soon as possible: -allergic reactions like skin rash, itching or hives, swelling of the face, lips, or tongue -breathing problems -changes in blood pressure -feeling faint or lightheaded, falls -fever or chills -flushing, sweating, or hot feelings -swelling of the ankles or feet Side effects that usually do not require medical attention (Report these to your doctor or health care professional if they continue or are bothersome.): -diarrhea -headache -nausea, vomiting -stomach pain This list may not describe all possible side effects. Call your doctor for medical advice about side effects. You may report side effects to FDA at 1-800-FDA-1088. Where should I keep my medicine? This drug is given in a hospital or clinic and will not be stored at home. NOTE: This sheet is a summary. It may not cover all possible information. If you have questions about this medicine, talk to your doctor, pharmacist, or health care provider.  2013, [employer]vier/[employer] Standard. (01/02/2008 9:48:25 PM)  

## 2012-05-31 ENCOUNTER — Encounter: Payer: Self-pay | Admitting: Internal Medicine

## 2012-05-31 ENCOUNTER — Telehealth: Payer: Self-pay | Admitting: Oncology

## 2012-05-31 LAB — URINALYSIS, ROUTINE W REFLEX MICROSCOPIC
Bilirubin Urine: NEGATIVE
Glucose, UA: NEGATIVE mg/dL
Hgb urine dipstick: NEGATIVE
Ketones, ur: NEGATIVE mg/dL
Leukocytes, UA: NEGATIVE
Nitrite: NEGATIVE
Protein, ur: NEGATIVE mg/dL
Specific Gravity, Urine: <1.005 — ABNORMAL LOW (ref 1.005–1.030)
Urobilinogen, UA: 0.2 mg/dL (ref 0.0–1.0)
pH: 7 (ref 5.0–8.0)

## 2012-05-31 NOTE — Telephone Encounter (Signed)
s.w. pt and advised on March and April appt schedule...pt aware and aok

## 2012-06-02 LAB — RETICULOCYTES (CHCC)
ABS Retic: 67 10*3/uL (ref 19.0–186.0)
RBC.: 3.72 MIL/uL — ABNORMAL LOW (ref 3.87–5.11)
Retic Ct Pct: 1.8 % (ref 0.4–2.3)

## 2012-06-02 LAB — SEDIMENTATION RATE: Sed Rate: 27 mm/hr — ABNORMAL HIGH (ref 0–22)

## 2012-06-02 LAB — ANA: Anti Nuclear Antibody(ANA): NEGATIVE

## 2012-06-02 LAB — LACTATE DEHYDROGENASE: LDH: 185 U/L (ref 94–250)

## 2012-06-02 LAB — TRANSFERRIN RECEPTOR, SOLUABLE: Transferrin Receptor, Soluble: 2.99 mg/L — ABNORMAL HIGH (ref 0.76–1.76)

## 2012-07-18 ENCOUNTER — Other Ambulatory Visit (HOSPITAL_BASED_OUTPATIENT_CLINIC_OR_DEPARTMENT_OTHER): Payer: Medicare Other | Admitting: Lab

## 2012-07-18 ENCOUNTER — Other Ambulatory Visit: Payer: Medicare Other

## 2012-07-18 DIAGNOSIS — D509 Iron deficiency anemia, unspecified: Secondary | ICD-10-CM

## 2012-07-18 DIAGNOSIS — D649 Anemia, unspecified: Secondary | ICD-10-CM

## 2012-07-18 LAB — FERRITIN: Ferritin: 158 ng/mL (ref 10–291)

## 2012-07-18 LAB — CBC WITH DIFFERENTIAL/PLATELET
BASO%: 1.3 % (ref 0.0–2.0)
Basophils Absolute: 0 10*3/uL (ref 0.0–0.1)
EOS%: 6.9 % (ref 0.0–7.0)
Eosinophils Absolute: 0.2 10*3/uL (ref 0.0–0.5)
HCT: 34.6 % — ABNORMAL LOW (ref 34.8–46.6)
HGB: 11 g/dL — ABNORMAL LOW (ref 11.6–15.9)
LYMPH%: 31.2 % (ref 14.0–49.7)
MCH: 28.5 pg (ref 25.1–34.0)
MCHC: 31.9 g/dL (ref 31.5–36.0)
MCV: 89.4 fL (ref 79.5–101.0)
MONO#: 0.2 10*3/uL (ref 0.1–0.9)
MONO%: 8.8 % (ref 0.0–14.0)
NEUT#: 1.4 10*3/uL — ABNORMAL LOW (ref 1.5–6.5)
NEUT%: 51.8 % (ref 38.4–76.8)
Platelets: 180 10*3/uL (ref 145–400)
RBC: 3.87 10*6/uL (ref 3.70–5.45)
RDW: 16.5 % — ABNORMAL HIGH (ref 11.2–14.5)
WBC: 2.7 10*3/uL — ABNORMAL LOW (ref 3.9–10.3)
lymph#: 0.9 10*3/uL (ref 0.9–3.3)

## 2012-07-24 ENCOUNTER — Other Ambulatory Visit: Payer: Medicare Other | Admitting: Lab

## 2012-07-24 ENCOUNTER — Telehealth: Payer: Self-pay | Admitting: Oncology

## 2012-07-24 ENCOUNTER — Ambulatory Visit (HOSPITAL_BASED_OUTPATIENT_CLINIC_OR_DEPARTMENT_OTHER): Payer: Medicare Other | Admitting: Oncology

## 2012-07-24 VITALS — BP 137/75 | HR 60 | Temp 97.4°F | Resp 20 | Ht 68.0 in | Wt 155.6 lb

## 2012-07-24 DIAGNOSIS — D72819 Decreased white blood cell count, unspecified: Secondary | ICD-10-CM

## 2012-07-24 DIAGNOSIS — D708 Other neutropenia: Secondary | ICD-10-CM

## 2012-07-24 DIAGNOSIS — D509 Iron deficiency anemia, unspecified: Secondary | ICD-10-CM

## 2012-07-24 DIAGNOSIS — E559 Vitamin D deficiency, unspecified: Secondary | ICD-10-CM

## 2012-07-24 DIAGNOSIS — D649 Anemia, unspecified: Secondary | ICD-10-CM | POA: Insufficient documentation

## 2012-07-24 NOTE — Patient Instructions (Signed)
You have a combined iron deficiency and a bone marrow  low  production  Anemia. There is likely an element of iron malabsorption from the GI tract You have a chronically low white blood cell count  We will check iron studies again in 4 months along with some common viruses that can cause bone marrow suppression

## 2012-07-27 NOTE — Progress Notes (Signed)
Hematology and Oncology Follow Up Visit  Belinda Harper [id number] 09/29/1940 72 y.o. 07/27/2012 6:25 PM   Principle Diagnosis: Encounter Diagnoses  Name Primary?  Gustavo Lah DEFICIENCY Yes  . Unspecified vitamin D deficiency   . Leukopenia   . Anemia, normocytic normochromic      Interim History:   Short interval followup for this 72 year old woman. I recently assumed her care when Dr.Odogwu left the practice. 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 She was receiving periodic parenteral iron infusions. Please see detailed summary note dictated by nurse practitioner Lonna Cobb on 05/30/2012. My impression after reviewing her data base was that she actually has 2 things going on. Hemoglobin has not risen over 11 g even when she is iron replete and MCV is normal. 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 She does not recall any severe viral illness in the past and denies hepatitis, yellow jaundice, malaria, or mononucleosis. There is no evidence for a hemolytic process. Total bilirubin less than 2, LDH 185, reticulocyte count 1.8% obtained at time of recent visit here. She has normal B12 and folic acid levels. She has intermittent moderate elevation in ESR which was 48 mm 2 months ago but repeat value today 27 mm. She did have a slight elevation of soluble transferrin receptors at 2.99 ounces 0.76-1.76) so she does have some element of iron deficiency as well. However when she was given parenteral iron her on February 3 her ferritin level was 30 which in most patients with iron malabsorption is sufficient storage iron to generate a normal hemoglobin. Ferritin  repeated on March 26 is 158 and  hemoglobin only changed  from 9.7 g on February 3 to 11 g on March 26.  She remains asymptomatic. She does fatigue easily.    Medications: reviewed  Allergies:  Allergies  Allergen Reactions  . Other     Bee sting  . Sulfa Antibiotics   . Sulfamethoxazole     REACTION: unspecified    Review of Systems: Constitutional:   Chronic mild fatigue no anorexia or weight loss Respiratory: No cough or dyspnea Cardiovascular:  No chest pain or palpitations Gastrointestinal: No abdominal pain or change in bowel habit Genito-Urinary: No urinary tract symptoms, no vaginal bleeding, no hematuria Musculoskeletal: No muscle, bone, or joint pain Neurologic: No headache or change in vision Skin: No rash or ecchymosis Remaining ROS negative.  Physical Exam: Blood pressure 137/75, pulse 60, temperature 97.4 F (36.3 C), temperature source Oral, resp. rate 20, height 5\' 8"  (1.727 m), weight 155 lb 9.6 oz (70.58 kg), SpO2 100.00%. Wt Readings from Last 3 Encounters:  07/24/12 155 lb 9.6 oz (70.58 kg)  05/30/12 156 lb 14.4 oz (71.169 kg)  01/24/12 153 lb (69.4 kg)     General appearance: Thin Caucasian woman HENNT: Pharynx no erythema or exudate Lymph nodes: No adenopathy Breasts: Lungs: Clear to auscultation resonant to percussion Heart: Regular rhythm no murmur Abdomen: Soft, nontender, no mass, no organomegaly Extremities: No edema, no calf tenderness Vascular: No cyanosis Neurologic: No focal deficit. Sensation intact to vibration over the fingertips Skin: No rash or ecchymosis  Lab Results: Lab Results  Component Value Date   WBC 2.7* 07/18/2012   HGB 11.0*  07/18/2012   HCT 34.6* 07/18/2012   MCV 89.4 07/18/2012   PLT 180 07/18/2012     Chemistry      Component Value Date/Time   NA 142 05/28/2012 1600   NA 140 01/18/2012 1641   K 3.7 05/28/2012 1600   K 3.8 01/18/2012 1641   CL 103 05/28/2012 1600   CL 102 01/18/2012 1641   CO2 29 05/28/2012 1600   CO2 29 01/18/2012 1641   BUN 19.3  05/28/2012 1600   BUN 25* 01/18/2012 1641   CREATININE 0.8 05/28/2012 1600   CREATININE 0.83 01/18/2012 1641      Component Value Date/Time   CALCIUM 9.5 05/28/2012 1600   CALCIUM 9.8 01/18/2012 1641   CALCIUM 9.5 08/03/2011 1210   ALKPHOS 82 05/28/2012 1600   ALKPHOS 67 08/03/2011 1210   AST 23 05/28/2012 1600   AST 22 08/03/2011 1210   ALT 13 05/28/2012 1600   ALT 14 08/03/2011 1210   BILITOT <0.20 05/28/2012 1600   BILITOT 0.1* 08/03/2011 1210       Impression and Plan:  Multifactorial anemia: Element of iron malabsorption with previous negative duodenal biopsies to rule out adult celiac disease. 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 Nondiagnostic bone marrow biopsy in 2006. Normal white count differential. Normal cytogenetics on the previous bone marrow. No progressive changes over time. 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  I will continue to check her attendant levels periodically but I don't anticipate doing parenteral iron unless ferritin falls to less than 15.  I'm going to check for exposure to Epstein-Barr virus and CMV. If these antibody levels are elevated, it would support a viral etiology of her bone marrow suppression. However, I told her that this would not really be diagnostic for anything but just "guilt by association".    CC:.    Levert Feinstein, MD 4/4/20146:25 PM

## 2012-08-06 ENCOUNTER — Other Ambulatory Visit (INDEPENDENT_AMBULATORY_CARE_PROVIDER_SITE_OTHER): Payer: Medicare Other

## 2012-08-06 DIAGNOSIS — E7800 Pure hypercholesterolemia, unspecified: Secondary | ICD-10-CM

## 2012-08-06 DIAGNOSIS — Z0000 Encounter for general adult medical examination without abnormal findings: Secondary | ICD-10-CM

## 2012-08-06 LAB — BASIC METABOLIC PANEL
BUN: 25 mg/dL — ABNORMAL HIGH (ref 6–23)
CO2: 30 mEq/L (ref 19–32)
Calcium: 9.4 mg/dL (ref 8.4–10.5)
Chloride: 104 mEq/L (ref 96–112)
Creatinine, Ser: 0.7 mg/dL (ref 0.4–1.2)
GFR: 82.34 mL/min (ref >60.00)
Glucose, Bld: 80 mg/dL (ref 70–99)
Potassium: 4.6 mEq/L (ref 3.5–5.1)
Sodium: 141 mEq/L (ref 135–145)

## 2012-08-06 LAB — LIPID PANEL
Cholesterol: 233 mg/dL — ABNORMAL HIGH (ref 0–200)
HDL: 70.9 mg/dL (ref >39.00)
Total CHOL/HDL Ratio: 3
Triglycerides: 65 mg/dL (ref 0.0–149.0)
VLDL: 13 mg/dL (ref 0.0–40.0)

## 2012-08-06 LAB — LDL CHOLESTEROL, DIRECT: Direct LDL: 141.9 mg/dL

## 2012-08-06 LAB — HEPATIC FUNCTION PANEL
ALT: 15 U/L (ref 0–35)
AST: 24 U/L (ref 0–37)
Albumin: 4.1 g/dL (ref 3.5–5.2)
Alkaline Phosphatase: 64 U/L (ref 39–117)
Bilirubin, Direct: 0 mg/dL (ref 0.0–0.3)
Total Bilirubin: 0.2 mg/dL — ABNORMAL LOW (ref 0.3–1.2)
Total Protein: 7.5 g/dL (ref 6.0–8.3)

## 2012-08-06 LAB — TSH: TSH: 0.58 u[IU]/mL (ref 0.35–5.50)

## 2012-08-21 ENCOUNTER — Ambulatory Visit (INDEPENDENT_AMBULATORY_CARE_PROVIDER_SITE_OTHER): Payer: Medicare Other | Admitting: Internal Medicine

## 2012-08-21 ENCOUNTER — Encounter: Payer: Self-pay | Admitting: Internal Medicine

## 2012-08-21 VITALS — BP 126/62 | HR 60 | Temp 98.3°F | Ht 67.75 in | Wt 156.0 lb

## 2012-08-21 DIAGNOSIS — D509 Iron deficiency anemia, unspecified: Secondary | ICD-10-CM

## 2012-08-21 DIAGNOSIS — J309 Allergic rhinitis, unspecified: Secondary | ICD-10-CM

## 2012-08-21 DIAGNOSIS — R6889 Other general symptoms and signs: Secondary | ICD-10-CM

## 2012-08-21 DIAGNOSIS — R229 Localized swelling, mass and lump, unspecified: Secondary | ICD-10-CM

## 2012-08-21 DIAGNOSIS — R2232 Localized swelling, mass and lump, left upper limb: Secondary | ICD-10-CM

## 2012-08-21 DIAGNOSIS — M810 Age-related osteoporosis without current pathological fracture: Secondary | ICD-10-CM

## 2012-08-21 DIAGNOSIS — R2230 Localized swelling, mass and lump, unspecified upper limb: Secondary | ICD-10-CM | POA: Insufficient documentation

## 2012-08-21 DIAGNOSIS — Z0000 Encounter for general adult medical examination without abnormal findings: Secondary | ICD-10-CM

## 2012-08-21 DIAGNOSIS — Z8709 Personal history of other diseases of the respiratory system: Secondary | ICD-10-CM

## 2012-08-21 NOTE — Progress Notes (Signed)
Chief Complaint  Patient presents with  . Medicare Wellness    Decreased energy concerns    HPI: Patient comes in today for Preventive Medicare wellness visit . And also some concerns No major injuries, ed visits ,hospitalizations , new medications since last visit.  She has a list from her insurance company and asked to sign a form so she can get a  rebate.   In regard to her significant anemia iron deficiency recurring Getting iron  Infusions every 4 months     Fu in 6-8 weeks  Some better but not optimum back to every 4 months   Doesn't  Think  that  This is causing her latest bout with decreased energy and fatigue.   She thinks something is wrong and needs to be evaluated she seems to have decreased exercise tolerance she is to be able to mow the grass walk and do certain tasks without having to sit down and rest and be short of breath. This was even when her anemia was worse. Recently she had dyspnea on exertion after   mowing grass not related to the anemia after infusion  Now had  Some jaw symptoms with this she said it was hot and she could have been a bit dehydrated but is concerned there could be a heart issue  Her parents had heart surgery disease in their 36s the same as her age  Has a lump on her left index finger not painful seems a bit large    Hearing: No change  Vision:  No limitations at present . Last eye check UTD  Safety:  Has smoke detector and wears seat belts.  No firearms. No excess sun exposure. Sees dentist regularly.  Falls:  No  Inserts has helped   Advance directive :  Reviewed  Has one.  Memory: Felt to be good  , feels like  short term memory  Issues  But no concern from others  r her family.  Depression: No anhedonia unusual crying or depressive symptoms  Nutrition: Eats well balanced diet; adequate calcium and vitamin D. No swallowing chewing problems.  Injury: no major injuries in the last six months.  Other healthcare providers:  Reviewed  today .  Social:  H H of 2  1 cat   Preventive parameters: up-to-date  Reviewed   ADLS:   There are no problems or need for assistance  driving, feeding, obtaining food, dressing, toileting and bathing, managing money using phone. She is independent.  EXERCISE/ HABITS  Per week   Water aerobic    Not walking as much more  toied with this  And howework No tobacco    etoh   ROS:  GEN/ HEENT: No fever, significant weight changes sweats headaches vision problems hearing changes, CV/ PULM; No chest pain  cough, syncope,edema    GI /GU: No adominal pain, vomiting, change in bowel habits. No blood in the stool. No significant GU symptoms. SKIN/HEME: ,no acute skin rashes suspicious lesions or bleeding. No lymphadenopathy, nodules, masses.  NEURO/ PSYCH:  No neurologic signs such as weakness numbness. No depression anxiety. IMM/ Allergy: No unusual infections.  Allergy .   REST of 12 system review negative except as per HPI   Past Medical History  Diagnosis Date  . Unspecified vitamin D deficiency 04/06/2007  . HYPERCHOLESTEROLEMIA 05/19/2008  . 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 HEMORRHOIDS 05/19/2008  . ALLERGIC RHINITIS 01/03/2007  . ASTHMA 01/03/2007  .  UTERINE PROLAPSE 04/06/2007  . OSTEOARTHRITIS 01/03/2007  . SHOULDER PAIN, RIGHT 01/27/2010  . OSTEOPOROSIS 01/03/2007  . ORTHOSTATIC DIZZINESS 04/10/2009  . MALAISE AND FATIGUE 01/27/2010  . NUMBNESS 01/27/2010  . CHEST DISCOMFORT 04/06/2007  . Dysuria 04/10/2009  . FRACTURE, ANKLE, LEFT 04/10/2009  . Blood transfusion 12 2010    hg 4 transfused 4 units  . Gastritis 2002  . DEPRESSION 01/03/2007    resolved    Family History  Problem Relation Age of Onset  . Hyperlipidemia Mother   . Heart disease Mother   . Ovarian cancer Mother   . Uterine cancer Mother   . Arthritis Mother   . Osteoporosis Mother   . Heart disease Father   . Arthritis Father   . Melanoma  Father    Past Surgical History  Procedure Laterality Date  . Panendoscopy      small bowel bx  . Ankle fracture surgery  02/2009    left ankle fracture and dislocation  . Cataract extraction w/ intraocular lens implant  2000    right eye  . Orif finger / thumb fracture  1970    reattached     History   Social History  . Marital Status: Divorced    Spouse Name: N/A    Number of Children: N/A  . Years of Education: N/A   Social History Main Topics  . Smoking status: Never Smoker   . Smokeless tobacco: Never Used  . Alcohol Use: No  . Drug Use: No  . Sexually Active: None   Other Topics Concern  . None   Social History Narrative   HH of  2  Her and granddaughter    Retired Artist   Never smoked    Pet cat allergic    Divorced   Regular exericise  walking mowing the lawn    Outpatient Encounter Prescriptions as of 08/21/2012  Medication Sig Dispense Refill  . Calcium Citrate-Vitamin D (CITRACAL + D PO) Take 250 mg by mouth 2 (two) times daily. Has 250IU of vitamin D3 and 80mg  of magnesium      . docusate sodium (COLACE) 100 MG capsule Take 100 mg by mouth daily.      [name] EPINEPHrine (EPIPEN) 0.3 mg/0.3 mL DEVI Inject 0.3 mLs (0.3 mg total) into the muscle as needed.  1 Device  0  . FERROUS GLUCONATE PO Take 81 mg by mouth 2 (two) times daily.      . Multiple Vitamins-Minerals (ONE-A-DAY WOMENS VITACRAVES PO) Take 1 tablet by mouth daily.      . vitamin C (ASCORBIC ACID) 500 MG tablet Take 500 mg by mouth 2 (two) times daily.      . vitamin D, CHOLECALCIFEROL, 400 UNITS tablet Take 400 Units by mouth daily. Pt takes on cloudy days only.      . [DISCONTINUED] Ascorbic Acid (VITAMIN C) 250 MG tablet Take 250 mg by mouth 2 (two) times daily.       . [DISCONTINUED] ferrous gluconate (FERGON) 246 (28 FE) MG tablet Take 246 mg by mouth 2 (two) times daily. 27mg  6 a day      . [DISCONTINUED] Nutritional Supplements (JUICE PLUS FIBRE PO) Take 1 capsule by mouth daily.         Facility-Administered Encounter Medications as of 08/21/2012  Medication Dose Route Frequency Provider Last Rate Last Dose  . [START ON 09/27/2012] 0.9 %  sodium chloride infusion   Intravenous Once Keitha Butte, NP  EXAM:  BP 126/62  Pulse 60  Temp(Src) 98.3 F (36.8 C) (Oral)  Ht 5' 7.75" (1.721 m)  Wt 156 lb (70.761 kg)  BMI 23.89 kg/m2  SpO2 98%  Body mass index is 23.89 kg/(m^2).  Physical Exam: Vital signs reviewed ZOX:WRUE is a well-developed well-nourished alert cooperative   who appears stated age in no acute distress.  HEENT: normocephalic atraumatic , Eyes: PERRL EOM's full, conjunctiva clear, Nares: paten,t no deformity discharge or tenderness., Ears: no deformity EAC's clear TMs with normal landmarks. Mouth: clear OP, no lesions, edema.  Moist mucous membranes. Dentition in adequate repair. NECK: supple without masses, thyromegaly or bruits. CHEST/PULM:  Clear to auscultation and percussion breath sounds equal no wheeze , rales or rhonchi. No chest wall deformities or tenderness. Breast: normal by inspection . No dimpling, discharge, masses, tenderness or discharge . CV: PMI is nondisplaced, S1 S2 no gallops, murmurs, rubs. Peripheral pulses are full without delay.No JVD .  ABDOMEN: Bowel sounds normal nontender  No guard or rebound, no hepato splenomegal no CVA tenderness.  No hernia. Extremtities:  No clubbing cyanosis or edema, no acute joint swelling or redness no focal atrophy left index finger decreased distal range of motion from her previous injury surgery there is a 0.8 cm nodule possibly synovial related to the joint nontender no redness NEURO:  Oriented x3, cranial nerves 3-12 appear to be intact, no obvious focal weakness,gait within normal limits no abnormal reflexes or asymmetrical SKIN: No acute rashes normal turgor, color, no bruising or petechiae. PSYCH: Oriented, good eye contact, no obvious depression anxiety, cognition and judgment  appear normal. LN: no cervical axillary inguinal adenopathy No noted deficits in memory, attention, and speech.   Lab Results  Component Value Date   WBC 2.7* 07/18/2012   HGB 11.0* 07/18/2012   HCT 34.6* 07/18/2012   PLT 180 07/18/2012   GLUCOSE 80 08/06/2012   CHOL 233* 08/06/2012   TRIG 65.0 08/06/2012   HDL 70.90 08/06/2012   LDLDIRECT 141.9 08/06/2012   ALT 15 08/06/2012   AST 24 08/06/2012   NA 141 08/06/2012   K 4.6 08/06/2012   CL 104 08/06/2012   CREATININE 0.7 08/06/2012   BUN 25* 08/06/2012   CO2 30 08/06/2012   TSH 0.58 08/06/2012   INR 1.04 04/10/2009    ASSESSMENT AND PLAN:  Discussed the following assessment and plan:  Visit for preventive health examination - Reviewed per then to the parameters counseled  Decreased exercise tolerance - Notable since last year or without an event ;family history heart disease history of asthma as a child. Plan PFTs and cardiology consult - Plan: Pulmonary function test  ANEMIA-IRON DEFICIENCY - Problematic extensive workup requiring iron infusions  ALLERGIC RHINITIS  Nodule of finger, left - Probably arthritis related versus ganglion advised against aspiration manipulation at this time risk of infection etc.  Osteoporosis, unspecified - se aches with biphosphonates consider other  prolia etc consider consult if needed  Hx of extrinsic asthma - Plan: Pulmonary function test  Patient Care Team: Madelin Headings, MD as PCP - General Laurice Record, MD (Hematology and Oncology) Serita Kyle, MD (Obstetrics and Gynecology) Louis Meckel, MD (Gastroenterology) Dorothyann Gibbs, MD (Ophthalmology) Baldo Ash, DDS (Dentistry) Claudie Revering, MD as Consulting Physician (Dermatology)  Patient Instructions  Contact us about cardiologist you wish to see so we can do referral for the change in exercise tolerance and fatigue. They can decide if stress test would be helpful .  At toimse point we may do pulmonary  function tests if not done in the recent past.  Consider prolia for osteoporosis   In the future but take vit d  Supplements as youoare doing.    Continue lifestyle intervention healthy eating and exercise . ROV   After you get evaluated  By cardiology and in 1 year for prevnetion or as needed  Also as long as hematology is following your blood count.

## 2012-08-21 NOTE — Patient Instructions (Addendum)
Contact us about cardiologist you wish to see so we can do referral for the change in exercise tolerance and fatigue. They can decide if stress test would be helpful . At toimse point we may do pulmonary function tests if not done in the recent past.  Consider prolia for osteoporosis   In the future but take vit d  Supplements as youoare doing.    Continue lifestyle intervention healthy eating and exercise . ROV   After you get evaluated  By cardiology and in 1 year for prevnetion or as needed  Also as long as hematology is following your blood count.

## 2012-08-22 ENCOUNTER — Other Ambulatory Visit: Payer: Self-pay | Admitting: Family Medicine

## 2012-08-22 ENCOUNTER — Encounter: Payer: Self-pay | Admitting: Internal Medicine

## 2012-08-22 DIAGNOSIS — R5381 Other malaise: Secondary | ICD-10-CM

## 2012-08-22 DIAGNOSIS — R6889 Other general symptoms and signs: Secondary | ICD-10-CM

## 2012-08-22 NOTE — Telephone Encounter (Signed)
This pt does not have a referral for a cardiologist

## 2012-09-10 ENCOUNTER — Encounter (HOSPITAL_BASED_OUTPATIENT_CLINIC_OR_DEPARTMENT_OTHER): Payer: Self-pay | Admitting: *Deleted

## 2012-09-10 NOTE — Progress Notes (Signed)
Pt has no heart or resp problems-no obese-she has been feeling tired and asked for cardiology workup due to family hx-has this sch next week

## 2012-09-11 ENCOUNTER — Ambulatory Visit (HOSPITAL_BASED_OUTPATIENT_CLINIC_OR_DEPARTMENT_OTHER)
Admission: RE | Admit: 2012-09-11 | Discharge: 2012-09-11 | Disposition: A | Discharge status: Home / Self Care | Payer: Medicare Other | Source: Ambulatory Visit | Attending: Orthopedic Surgery | Admitting: Orthopedic Surgery

## 2012-09-11 ENCOUNTER — Encounter (HOSPITAL_BASED_OUTPATIENT_CLINIC_OR_DEPARTMENT_OTHER)
Admission: RE | Disposition: A | Discharge status: Home / Self Care | Payer: Self-pay | Source: Ambulatory Visit | Attending: Orthopedic Surgery

## 2012-09-11 ENCOUNTER — Encounter (HOSPITAL_BASED_OUTPATIENT_CLINIC_OR_DEPARTMENT_OTHER): Payer: Self-pay | Admitting: Orthopedic Surgery

## 2012-09-11 ENCOUNTER — Encounter (HOSPITAL_BASED_OUTPATIENT_CLINIC_OR_DEPARTMENT_OTHER): Payer: Self-pay | Admitting: Anesthesiology

## 2012-09-11 ENCOUNTER — Ambulatory Visit (HOSPITAL_BASED_OUTPATIENT_CLINIC_OR_DEPARTMENT_OTHER): Payer: Medicare Other | Admitting: Anesthesiology

## 2012-09-11 ENCOUNTER — Other Ambulatory Visit: Payer: Self-pay | Admitting: Orthopedic Surgery

## 2012-09-11 DIAGNOSIS — J45909 Unspecified asthma, uncomplicated: Secondary | ICD-10-CM | POA: Insufficient documentation

## 2012-09-11 DIAGNOSIS — M1990 Unspecified osteoarthritis, unspecified site: Secondary | ICD-10-CM | POA: Insufficient documentation

## 2012-09-11 DIAGNOSIS — D2110 Benign neoplasm of connective and other soft tissue of unspecified upper limb, including shoulder: Secondary | ICD-10-CM | POA: Insufficient documentation

## 2012-09-11 DIAGNOSIS — F329 Major depressive disorder, single episode, unspecified: Secondary | ICD-10-CM | POA: Insufficient documentation

## 2012-09-11 DIAGNOSIS — E7800 Pure hypercholesterolemia, unspecified: Secondary | ICD-10-CM | POA: Insufficient documentation

## 2012-09-11 DIAGNOSIS — F3289 Other specified depressive episodes: Secondary | ICD-10-CM | POA: Insufficient documentation

## 2012-09-11 DIAGNOSIS — Z882 Allergy status to sulfonamides status: Secondary | ICD-10-CM | POA: Insufficient documentation

## 2012-09-11 DIAGNOSIS — Z87828 Personal history of other (healed) physical injury and trauma: Secondary | ICD-10-CM | POA: Insufficient documentation

## 2012-09-11 DIAGNOSIS — Z91038 Other insect allergy status: Secondary | ICD-10-CM | POA: Insufficient documentation

## 2012-09-11 DIAGNOSIS — M810 Age-related osteoporosis without current pathological fracture: Secondary | ICD-10-CM | POA: Insufficient documentation

## 2012-09-11 DIAGNOSIS — D509 Iron deficiency anemia, unspecified: Secondary | ICD-10-CM | POA: Insufficient documentation

## 2012-09-11 DIAGNOSIS — J309 Allergic rhinitis, unspecified: Secondary | ICD-10-CM | POA: Insufficient documentation

## 2012-09-11 HISTORY — PX: MASS EXCISION: SHX2000

## 2012-09-11 SURGERY — EXCISION MASS
Anesthesia: General | Site: Thumb | Laterality: Left | Wound class: Clean

## 2012-09-11 MED ORDER — MIDAZOLAM HCL 2 MG/2ML IJ SOLN: 1.0000 mg | INTRAMUSCULAR | Status: DC | PRN

## 2012-09-11 MED ORDER — 0.9 % SODIUM CHLORIDE (POUR BTL) OPTIME
TOPICAL | Status: DC | PRN
  Administered 2012-09-11: 1000 mL

## 2012-09-11 MED ORDER — FENTANYL CITRATE 0.05 MG/ML IJ SOLN: 50.0000 ug | INTRAMUSCULAR | Status: DC | PRN

## 2012-09-11 MED ORDER — FENTANYL CITRATE 0.05 MG/ML IJ SOLN
INTRAMUSCULAR | Status: DC | PRN
  Administered 2012-09-11 (×2): 50 ug via INTRAVENOUS

## 2012-09-11 MED ORDER — DEXAMETHASONE SODIUM PHOSPHATE 4 MG/ML IJ SOLN
INTRAMUSCULAR | Status: DC | PRN
  Administered 2012-09-11: 10 mg via INTRAVENOUS

## 2012-09-11 MED ORDER — ONDANSETRON HCL 4 MG/2ML IJ SOLN
INTRAMUSCULAR | Status: DC | PRN
  Administered 2012-09-11: 4 mg via INTRAVENOUS

## 2012-09-11 MED ORDER — HYDROCODONE-ACETAMINOPHEN 5-325 MG PO TABS: 1.0000 | ORAL_TABLET | Freq: Four times a day (QID) | ORAL | Status: DC | PRN

## 2012-09-11 MED ORDER — BUPIVACAINE HCL (PF) 0.25 % IJ SOLN
INTRAMUSCULAR | Status: DC | PRN
  Administered 2012-09-11: 7 mL

## 2012-09-11 MED ORDER — HYDROMORPHONE HCL PF 1 MG/ML IJ SOLN: 0.2500 mg | INTRAMUSCULAR | Status: DC | PRN

## 2012-09-11 MED ORDER — OXYCODONE HCL 5 MG/5ML PO SOLN: 5.0000 mg | Freq: Once | ORAL | Status: DC | PRN

## 2012-09-11 MED ORDER — LACTATED RINGERS IV SOLN
INTRAVENOUS | Status: DC
  Administered 2012-09-11 (×2): via INTRAVENOUS

## 2012-09-11 MED ORDER — GLYCOPYRROLATE 0.2 MG/ML IJ SOLN
INTRAMUSCULAR | Status: DC | PRN
  Administered 2012-09-11: 0.2 mg via INTRAVENOUS

## 2012-09-11 MED ORDER — EPHEDRINE SULFATE 50 MG/ML IJ SOLN
INTRAMUSCULAR | Status: DC | PRN
  Administered 2012-09-11 (×2): 10 mg via INTRAVENOUS

## 2012-09-11 MED ORDER — CHLORHEXIDINE GLUCONATE 4 % EX LIQD: 60.0000 mL | Freq: Once | CUTANEOUS | Status: DC

## 2012-09-11 MED ORDER — LIDOCAINE HCL (CARDIAC) 20 MG/ML IV SOLN
INTRAVENOUS | Status: DC | PRN
  Administered 2012-09-11: 75 mg via INTRAVENOUS

## 2012-09-11 MED ORDER — ONDANSETRON HCL 4 MG/2ML IJ SOLN: 4.0000 mg | Freq: Once | INTRAMUSCULAR | Status: DC | PRN

## 2012-09-11 MED ORDER — OXYCODONE HCL 5 MG PO TABS: 5.0000 mg | ORAL_TABLET | Freq: Once | ORAL | Status: DC | PRN

## 2012-09-11 MED ORDER — CEFAZOLIN SODIUM-DEXTROSE 2-3 GM-% IV SOLR
2.0000 g | INTRAVENOUS | Status: AC
  Administered 2012-09-11: 2 g via INTRAVENOUS

## 2012-09-11 MED ORDER — PROPOFOL 10 MG/ML IV BOLUS
INTRAVENOUS | Status: DC | PRN
  Administered 2012-09-11: 200 mg via INTRAVENOUS

## 2012-09-11 SURGICAL SUPPLY — 55 items
BANDAGE COBAN STERILE 2 (GAUZE/BANDAGES/DRESSINGS) ×1 IMPLANT
BANDAGE GAUZE ELAST BULKY 4 IN (GAUZE/BANDAGES/DRESSINGS) IMPLANT
BLADE MINI RND TIP GREEN BEAV (BLADE) IMPLANT
BLADE SURG 15 STRL LF DISP TIS (BLADE) ×1 IMPLANT
BLADE SURG 15 STRL SS (BLADE) ×2
BNDG CMPR 9X4 STRL LF SNTH (GAUZE/BANDAGES/DRESSINGS) ×1
BNDG COHESIVE 1X5 TAN STRL LF (GAUZE/BANDAGES/DRESSINGS) ×1 IMPLANT
BNDG COHESIVE 3X5 TAN STRL LF (GAUZE/BANDAGES/DRESSINGS) IMPLANT
BNDG ESMARK 4X9 LF (GAUZE/BANDAGES/DRESSINGS) ×1 IMPLANT
CHLORAPREP W/TINT 26ML (MISCELLANEOUS) ×2 IMPLANT
CLOTH BEACON ORANGE TIMEOUT ST (SAFETY) ×2 IMPLANT
CORDS BIPOLAR (ELECTRODE) ×2 IMPLANT
COVER MAYO STAND STRL (DRAPES) ×2 IMPLANT
COVER TABLE BACK 60X90 (DRAPES) ×2 IMPLANT
CUFF TOURNIQUET SINGLE 18IN (TOURNIQUET CUFF) ×1 IMPLANT
DECANTER SPIKE VIAL GLASS SM (MISCELLANEOUS) IMPLANT
DRAIN PENROSE 1/2X12 LTX STRL (WOUND CARE) IMPLANT
DRAPE EXTREMITY T 121X128X90 (DRAPE) ×2 IMPLANT
DRAPE SURG 17X23 STRL (DRAPES) ×2 IMPLANT
GAUZE XEROFORM 1X8 LF (GAUZE/BANDAGES/DRESSINGS) ×2 IMPLANT
GLOVE BIO SURGEON STRL SZ 6 (GLOVE) ×1 IMPLANT
GLOVE BIO SURGEON STRL SZ 6.5 (GLOVE) ×2 IMPLANT
GLOVE BIO SURGEON STRL SZ7.5 (GLOVE) ×1 IMPLANT
GLOVE BIOGEL M 7.0 STRL (GLOVE) ×1 IMPLANT
GLOVE BIOGEL PI IND STRL 6.5 (GLOVE) IMPLANT
GLOVE BIOGEL PI IND STRL 8 (GLOVE) IMPLANT
GLOVE BIOGEL PI IND STRL 8.5 (GLOVE) ×1 IMPLANT
GLOVE BIOGEL PI INDICATOR 6.5 (GLOVE) ×1
GLOVE BIOGEL PI INDICATOR 8 (GLOVE) ×1
GLOVE BIOGEL PI INDICATOR 8.5 (GLOVE) ×1
GLOVE SURG ORTHO 8.0 STRL STRW (GLOVE) ×2 IMPLANT
GOWN BRE IMP PREV XXLGXLNG (GOWN DISPOSABLE) ×1 IMPLANT
GOWN PREVENTION PLUS XLARGE (GOWN DISPOSABLE) ×1 IMPLANT
NDL SAFETY ECLIPSE 18X1.5 (NEEDLE) ×1 IMPLANT
NEEDLE 27GAX1X1/2 (NEEDLE) ×1 IMPLANT
NEEDLE HYPO 18GX1.5 SHARP (NEEDLE) ×2
NS IRRIG 1000ML POUR BTL (IV SOLUTION) ×2 IMPLANT
PACK BASIN DAY SURGERY FS (CUSTOM PROCEDURE TRAY) ×2 IMPLANT
PAD CAST 3X4 CTTN HI CHSV (CAST SUPPLIES) IMPLANT
PADDING CAST ABS 3INX4YD NS (CAST SUPPLIES)
PADDING CAST ABS 4INX4YD NS (CAST SUPPLIES)
PADDING CAST ABS COTTON 3X4 (CAST SUPPLIES) IMPLANT
PADDING CAST ABS COTTON 4X4 ST (CAST SUPPLIES) ×1 IMPLANT
PADDING CAST COTTON 3X4 STRL (CAST SUPPLIES)
SPLINT PLASTER CAST XFAST 3X15 (CAST SUPPLIES) IMPLANT
SPLINT PLASTER XTRA FASTSET 3X (CAST SUPPLIES) ×8
SPONGE GAUZE 4X4 12PLY (GAUZE/BANDAGES/DRESSINGS) ×2 IMPLANT
STOCKINETTE 4X48 STRL (DRAPES) ×2 IMPLANT
SUT VIC AB 4-0 P2 18 (SUTURE) IMPLANT
SUT VICRYL RAPID 5 0 P 3 (SUTURE) IMPLANT
SUT VICRYL RAPIDE 4/0 PS 2 (SUTURE) ×2 IMPLANT
SYR BULB 3OZ (MISCELLANEOUS) ×2 IMPLANT
SYR CONTROL 10ML LL (SYRINGE) IMPLANT
TOWEL OR 17X24 6PK STRL BLUE (TOWEL DISPOSABLE) ×4 IMPLANT
UNDERPAD 30X30 INCONTINENT (UNDERPADS AND DIAPERS) ×2 IMPLANT

## 2012-09-11 NOTE — Transfer of Care (Signed)
Immediate Anesthesia Transfer of Care Note  Patient: Belinda Harper  Procedure(s) Performed: Procedure(s): 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; Rotation Flap (Left)  Patient Location: PACU  Anesthesia Type:General  Level of Consciousness: awake, alert  and oriented  Airway & Oxygen Therapy: Patient Spontanous Breathing and Patient connected to face mask oxygen  Post-op Assessment: Report given to PACU RN and Post -op Vital signs reviewed and stable  Post vital signs: Reviewed and stable  Complications: No apparent anesthesia complications

## 2012-09-11 NOTE — Anesthesia Postprocedure Evaluation (Signed)
  Anesthesia Post-op Note  Patient: Belinda Harper  Procedure(s) Performed: Procedure(s): 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; Rotation Flap (Left)  Patient Location: PACU  Anesthesia Type:General  Level of Consciousness: awake, alert  and oriented  Airway and Oxygen Therapy: Patient Spontanous Breathing  Post-op Pain: mild  Post-op Assessment: Post-op Vital signs reviewed  Post-op Vital Signs: Reviewed  Complications: No apparent anesthesia complications

## 2012-09-11 NOTE — Brief Op Note (Signed)
09/11/2012  1:13 PM  PATIENT:  Belinda Harper  72 y.o. female  PRE-OPERATIVE DIAGNOSIS:  Mucoid Tumor Left Thumb  POST-OPERATIVE DIAGNOSIS:  Mucoid Tumor Left Thumb  PROCEDURE:  Procedure(s): 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; Rotation Flap (Left)  SURGEON:  Surgeon(s) and Role:    * Nicki Reaper, MD - Primary    * Tami Ribas, MD - Assisting  PHYSICIAN ASSISTANT:   ASSISTANTS: K Ryot Burrous,MD   ANESTHESIA:   local and general  EBL:  Total I/O In: 300 [I.V.:300] Out: -   BLOOD ADMINISTERED:none  DRAINS: none   LOCAL MEDICATIONS USED:  MARCAINE     SPECIMEN:  Excision  DISPOSITION OF SPECIMEN:  PATHOLOGY  COUNTS:  YES  TOURNIQUET:   Total Tourniquet Time Documented: Upper Arm (Left) - 26 minutes Total: Upper Arm (Left) - 26 minutes   DICTATION: .Other Dictation: Dictation Number [id number]  PLAN OF CARE: Discharge to home after PACU  PATIENT DISPOSITION:  PACU - hemodynamically stable.

## 2012-09-11 NOTE — Anesthesia Preprocedure Evaluation (Signed)

## 2012-09-11 NOTE — Anesthesia Procedure Notes (Signed)
Procedure Name: LMA Insertion Date/Time: 09/11/2012 12:29 PM Performed by: Gar Gibbon Pre-anesthesia Checklist: Patient identified, Emergency Drugs available, Suction available and Patient being monitored Patient Re-evaluated:Patient Re-evaluated prior to inductionOxygen Delivery Method: Circle System Utilized Preoxygenation: Pre-oxygenation with 100% oxygen Intubation Type: IV induction Ventilation: Mask ventilation without difficulty LMA: LMA inserted LMA Size: 3.0 Number of attempts: 2 Airway Equipment and Method: bite block Placement Confirmation: positive ETCO2 and breath sounds checked- equal and bilateral Tube secured with: Tape Dental Injury: Teeth and Oropharynx as per pre-operative assessment and Bloody posterior oropharynx  Difficulty Due To: Difficulty was unanticipated and Difficult Airway- due to limited oral opening

## 2012-09-11 NOTE — H&P (Signed)
Belinda Harper is a 72 year old left hand dominant female with a very large mass on the dorsal aspect IP joint of her left thumb. This has been present for one month and increasing. She has a history of a chain saw injury running longitudinally down the central aspect of her same thumb.  She complains of intermittent, mild dull aching type pain with a feeling of swelling. She states it is getting worse. No history of diabetes, thyroid problems, arthritis or gout. Activity makes it worse and rest makes it better.  PAST MEDICAL HISTORY: She is allergic to sulfa. She is on vitamins, iron, calcium, magnesium, Colace. She has had ankle surgery and the thumb repair in 1977.  FAMILY H ISTORY: Positive for heart disease and arthritis.  SOCIAL HISTORY: She does not smoke or drink. She is divorced.  REVIEW OF SYSTEMS: Positive for glasses, cataracts, shortness of breath, balance problems, and anemia, otherwise negative for 14 points.  Belinda Harper is an 72 y.o. female.   Chief Complaint: mucoid tumor lt thumb HPI: see above  Past Medical History  Diagnosis Date  . Unspecified vitamin D deficiency 04/06/2007  . HYPERCHOLESTEROLEMIA 05/19/2008  . 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 HEMORRHOIDS 05/19/2008  . ALLERGIC RHINITIS 01/03/2007  . ASTHMA 01/03/2007  . UTERINE PROLAPSE 04/06/2007  . OSTEOARTHRITIS 01/03/2007  . SHOULDER PAIN, RIGHT 01/27/2010  . OSTEOPOROSIS 01/03/2007  . ORTHOSTATIC DIZZINESS 04/10/2009  . MALAISE AND FATIGUE 01/27/2010  . NUMBNESS 01/27/2010  . CHEST DISCOMFORT 04/06/2007  . Dysuria 04/10/2009  . FRACTURE, ANKLE, LEFT 04/10/2009  . Blood transfusion 12 2010    hg 4 transfused 4 units  . Gastritis 2002  . DEPRESSION 01/03/2007    resolved  . Shortness of breath     Past Surgical History  Procedure Laterality Date  . Panendoscopy      small bowel bx  . Ankle fracture surgery  02/2009    left ankle fracture  and dislocation  . Cataract extraction w/ intraocular lens implant  2000    right eye  . Orif finger / thumb fracture  1970    reattached    Family History  Problem Relation Age of Onset  . Hyperlipidemia Mother   . Heart disease Mother   . Ovarian cancer Mother   . Uterine cancer Mother   . Arthritis Mother   . Osteoporosis Mother   . Heart disease Father   . Arthritis Father   . Melanoma Father    Social History:  reports that she has never smoked. She has never used smokeless tobacco. She reports that she does not drink alcohol or use illicit drugs.  Allergies:  Allergies  Allergen Reactions  . Other     Bee sting  . Sulfa Antibiotics   . Sulfamethoxazole     REACTION: unspecified    No prescriptions prior to admission    No results found for this or any previous visit (from the past 48 hour(s)).  No results found.   Pertinent items are noted in HPI.  Height 5\' 8"  (1.727 m), weight 68.947 kg (152 lb).  General appearance: alert, cooperative and appears stated age Head: Normocephalic, without obvious abnormality Neck: no JVD Resp: clear to auscultation bilaterally Cardio: regular rate and rhythm, S1, S2 normal, no murmur, click, rub or gallop GI: soft, non-tender; bowel sounds normal; no masses,  no organomegaly Extremities: extremities normal, atraumatic, no cyanosis or edema Pulses: 2+ and  symmetric Skin: Skin color, texture, turgor normal. No rashes or lesions Neurologic: Grossly normal Incision/Wound: na  Assessment/Plan X-rays reveal degenerative changes at the IP joint of the thumb.  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  This is going to require a probable flap for rotation with debridement of the joint. She is aware of the potential for loss of portions of the flap due to the old saw injury but I do not see any other way of trying to get coverage for this unless her skin is supple enough to be able to primarily close  this. She is aware of this but would like to proceed to have this excised. This will be scheduled as an outpatient under regional anesthesia for excision of mucoid cyst, debridement IP joint with probable rotation dorsal flap left thumb.   Xzaria Teo R 09/11/2012, 9:22 AM

## 2012-09-11 NOTE — Op Note (Signed)
Dictation Number [id number]

## 2012-09-12 ENCOUNTER — Encounter (HOSPITAL_BASED_OUTPATIENT_CLINIC_OR_DEPARTMENT_OTHER): Payer: Self-pay | Admitting: Orthopedic Surgery

## 2012-09-12 NOTE — Op Note (Signed)
Belinda Harper, DENARDO                 ACCOUNT NO.:  [id number]  MEDICAL RECORD NO.:  [id number]  LOCATION:                                 FACILITY:  PHYSICIAN:  Cindee Salt, M.D.            DATE OF BIRTH:  DATE OF PROCEDURE:  09/11/2012 DATE OF DISCHARGE:                              OPERATIVE REPORT   PREOPERATIVE DIAGNOSIS:  Mucoid tumor, left thumb.  POSTOPERATIVE DIAGNOSIS:  Mucoid tumor, left thumb.  OPERATION:  Excision of mucoid cyst with debridement of distal interphalangeal joint, left thumb with rotation of dorsal flap.  SURGEON:  Cindee Salt, MD  ASSISTANT:  Betha Loa, MD  ANESTHESIA:  General with metacarpal block.  ANESTHESIOLOGIST:  Sheldon Silvan, MD  HISTORY:  The patient is a 72 year old female with a large cystic lesion on the dorsal radial aspect of the interphalangeal joint, left thumb. This is approximately the size of a dime in diameter with easy transillumination.  X-rays revealed degenerative changes, distal interphalangeal joint.  She has history of a saw injury to the dorsal aspect of that thumb with significant loss of mobility of the IP joint. She has elected to have this excised with the probability of rotational flap for skin coverage.  Pre, peri, and postoperative course have been discussed along with risks and complications.  She is aware that there is no guarantee with the surgery; possibility of infection; recurrence of injury to arteries, nerves, tendons, incomplete relief of symptoms, partial loss of a flap due to the old injury.  In the preoperative area, the patient was seen, the extremity marked by both the patient and surgeon.  Antibiotic given.  PROCEDURE:  The patient was brought to the operating room, where general anesthetic was carried out without difficulty.  She was prepped using ChloraPrep, supine position, left arm free.  A 3-minute dry time was allowed.  Time-out taken, confirming the patient and procedure.  The limb was  exsanguinated with an Esmarch bandage.  Tourniquet placed on the upper arm was inflated to 250 mmHg.  A curvilinear incision was made over the interphalangeal joint, carried down in mid lateral line of the left thumb, carried down through subcutaneous tissue.  The cyst was immediately encountered.  The skin beneath this was pearly white with no apparent vascularity.  A sinus tract was present going down to the interphalangeal joint.  The skin was excised.  The incision extended proximally, brought across the metacarpophalangeal joint, curved to allow rotation of flap with blunt and sharp dissection.  This flap was resected from the extensor tendon.  The IP joint was debrided with a small rongeur.  Complete synovectomy performed.  The wound was copiously irrigated with saline.  Neurovascular structures protected proximally and flap was then rotated covering the entire defect with the IP joint in the extended position.  The wound was then closed with interrupted 4- 0 Vicryl Rapide sutures.  A sterile compressive dressing and splint with the thumb including wrist fully extended was applied.  On deflation of the tourniquet, immediately pinked.  She was taken to the recovery room for observation in satisfactory condition.  She will be discharged  home [facility] on Vicodin.  It was noted that at the completion of the procedure, a metacarpal block was given with 0.25% Marcaine without epinephrine and approximately 7 mL was used.          ______________________________ Cindee Salt, M.D.     GK/MEDQ  D:  09/11/2012  T:  09/12/2012  Job:  [id number]

## 2012-09-19 ENCOUNTER — Encounter: Payer: Self-pay | Admitting: Cardiology

## 2012-09-19 ENCOUNTER — Ambulatory Visit (INDEPENDENT_AMBULATORY_CARE_PROVIDER_SITE_OTHER): Payer: Medicare Other | Admitting: Cardiology

## 2012-09-19 VITALS — BP 130/80 | HR 72 | Wt 154.0 lb

## 2012-09-19 DIAGNOSIS — R079 Chest pain, unspecified: Secondary | ICD-10-CM

## 2012-09-19 DIAGNOSIS — R0789 Other chest pain: Secondary | ICD-10-CM

## 2012-09-19 DIAGNOSIS — E7800 Pure hypercholesterolemia, unspecified: Secondary | ICD-10-CM

## 2012-09-19 NOTE — Patient Instructions (Addendum)
Your physician recommends that you schedule a follow-up appointment in: 2-4 WEEKS WITH DR Jens Som  Your physician has requested that you have en exercise stress myoview. For further information please visit [email or web address] Please follow instruction sheet, as given.

## 2012-09-19 NOTE — Assessment & Plan Note (Signed)
Management per primary care. 

## 2012-09-19 NOTE — Progress Notes (Signed)
HPI: 72 year old female for evaluation of dyspnea. No prior cardiac history. CBC in March of 2014 showed a white blood cell count of 2.7, hemoglobin 11.0, MCV 89.4 and platelet count 180. Laboratories in April of 2014 showed normal renal function, normal liver functions and TSH. Patient states that for the past 3 months she has had fatigue with activities. She also has dyspnea but no orthopnea, PND, pedal edema or syncope. She also notes throat and back tightness that occurs with exertion and improves with rest. She first noticed this with water aerobics. She has also noticed this after mowing her yard. This does not occur at rest. Because of the above we were asked to evaluate. Both patient's parents had bypass at age 73 and she states this concerned her as well because of her age.  Current Outpatient Prescriptions  Medication Sig Dispense Refill  . Calcium Citrate-Vitamin D (CITRACAL + D PO) Take 250 mg by mouth 2 (two) times daily. Has 250IU of vitamin D3 and 80mg  of magnesium      . docusate sodium (COLACE) 100 MG capsule Take 100 mg by mouth daily.      [name] EPINEPHrine (EPIPEN) 0.3 mg/0.3 mL DEVI Inject 0.3 mLs (0.3 mg total) into the muscle as needed.  1 Device  0  . FERROUS GLUCONATE PO Take 81 mg by mouth 2 (two) times daily.      . Multiple Vitamins-Minerals (ONE-A-DAY WOMENS VITACRAVES PO) Take 1 tablet by mouth daily.      . vitamin C (ASCORBIC ACID) 500 MG tablet Take 500 mg by mouth 2 (two) times daily.      . vitamin D, CHOLECALCIFEROL, 400 UNITS tablet Take 400 Units by mouth daily. Pt takes on cloudy days only.       No current facility-administered medications for this visit.   Facility-Administered Medications Ordered in Other Visits  Medication Dose Route Frequency Provider Last Rate Last Dose  . [START ON 09/27/2012] 0.9 %  sodium chloride infusion   Intravenous Once Keitha Butte, NP        Allergies  Allergen Reactions  . Other     Bee sting  . Sulfa Antibiotics   .  Sulfamethoxazole     REACTION: unspecified    Past Medical History  Diagnosis Date  . Unspecified vitamin D deficiency 04/06/2007  . HYPERCHOLESTEROLEMIA 05/19/2008  . 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 HEMORRHOIDS 05/19/2008  . ALLERGIC RHINITIS 01/03/2007  . ASTHMA 01/03/2007  . UTERINE PROLAPSE 04/06/2007  . OSTEOARTHRITIS 01/03/2007  . OSTEOPOROSIS 01/03/2007  . FRACTURE, ANKLE, LEFT 04/10/2009  . Blood transfusion 12 2010    hg 4 transfused 4 units  . Gastritis 2002  . DEPRESSION 01/03/2007    resolved    Past Surgical History  Procedure Laterality Date  . Panendoscopy      small bowel bx  . Ankle fracture surgery  02/2009    left ankle fracture and dislocation  . Cataract extraction w/ intraocular lens implant  2000    right eye  . Orif finger / thumb fracture  1970    reattached  . 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 Marital Status: Divorced    Spouse Name: N/A  Number of Children: 2  . Years of Education: N/A   Occupational History  . Not on file.   Social History Main Topics  . Smoking status: Never Smoker   . Smokeless tobacco: Never Used  . Alcohol Use: Yes     Comment: rare  . Drug Use: No  . Sexually Active: Not on file   Other Topics Concern  . Not on file   Social History Narrative   HH of  2  Her and granddaughter    Retired Artist   Never smoked    Pet cat allergic    Divorced   Regular exericise  walking mowing the lawn    Family History  Problem Relation Age of Onset  . Hyperlipidemia Mother   . Heart disease Mother     CABG age 54  . Ovarian cancer Mother   . Uterine cancer Mother   . Arthritis Mother   . Osteoporosis Mother   . Heart  disease Father     CABG age 78  . Arthritis Father   . Melanoma Father     ROS: fatigue but no fevers or chills, productive cough, hemoptysis, dysphasia, odynophagia, melena, hematochezia, dysuria, hematuria, rash, seizure activity, orthopnea, PND, pedal edema, claudication. Remaining systems are negative.  Physical Exam:   Blood pressure 130/80, pulse 72, weight 154 lb (69.854 kg).  General:  Well developed/well nourished in NAD Skin warm/dry Patient not depressed No peripheral clubbing Back-normal HEENT-normal/normal eyelids Neck supple/normal carotid upstroke bilaterally; no bruits; no JVD; no thyromegaly chest - CTA/ normal expansion CV - RRR/normal S1 and S2; no murmurs, rubs or gallops;  PMI nondisplaced Abdomen -NT/ND, no HSM, no mass, + bowel sounds, no bruit 2+ femoral pulses, no bruits Ext-no edema, chords, 2+ DP Neuro-grossly nonfocal  ECG sinus rhythm at a rate of 72. No ST changes.

## 2012-09-19 NOTE — Assessment & Plan Note (Signed)
Patient describes exertional fatigue, dyspnea and throat tightness. Symptoms are concerning based on description. Her electrocardiogram is normal. She does have a long history of iron deficiency anemia. However her hemoglobin was checked recently and was 11. We discussed options today. I will plan to proceed with a stress Myoview. If she has ischemia she will most likely require catheterization. However there will be some issue if intervention required as apparently aspirin has caused significant worsening of her anemia previously. She does state that previous GI evaluations have been negative. I will not add aspirin at this point based on the above.

## 2012-09-21 ENCOUNTER — Ambulatory Visit (INDEPENDENT_AMBULATORY_CARE_PROVIDER_SITE_OTHER): Payer: Medicare Other | Admitting: Internal Medicine

## 2012-09-21 DIAGNOSIS — Z8709 Personal history of other diseases of the respiratory system: Secondary | ICD-10-CM

## 2012-09-21 LAB — PULMONARY FUNCTION TEST

## 2012-09-21 NOTE — Progress Notes (Signed)
PFT done today. 

## 2012-09-27 ENCOUNTER — Ambulatory Visit (HOSPITAL_COMMUNITY): Payer: Medicare Other | Attending: Cardiology | Admitting: Radiology

## 2012-09-27 VITALS — BP 145/66 | HR 52 | Ht 68.0 in | Wt 152.0 lb

## 2012-09-27 DIAGNOSIS — Z8249 Family history of ischemic heart disease and other diseases of the circulatory system: Secondary | ICD-10-CM | POA: Insufficient documentation

## 2012-09-27 DIAGNOSIS — R0609 Other forms of dyspnea: Secondary | ICD-10-CM | POA: Insufficient documentation

## 2012-09-27 DIAGNOSIS — I4949 Other premature depolarization: Secondary | ICD-10-CM

## 2012-09-27 DIAGNOSIS — R5381 Other malaise: Secondary | ICD-10-CM | POA: Insufficient documentation

## 2012-09-27 DIAGNOSIS — E785 Hyperlipidemia, unspecified: Secondary | ICD-10-CM | POA: Insufficient documentation

## 2012-09-27 DIAGNOSIS — R0602 Shortness of breath: Secondary | ICD-10-CM

## 2012-09-27 DIAGNOSIS — R000 Tachycardia, unspecified: Secondary | ICD-10-CM | POA: Insufficient documentation

## 2012-09-27 DIAGNOSIS — R0789 Other chest pain: Secondary | ICD-10-CM | POA: Insufficient documentation

## 2012-09-27 DIAGNOSIS — R0989 Other specified symptoms and signs involving the circulatory and respiratory systems: Secondary | ICD-10-CM | POA: Insufficient documentation

## 2012-09-27 DIAGNOSIS — R079 Chest pain, unspecified: Secondary | ICD-10-CM

## 2012-09-27 MED ORDER — TECHNETIUM TC 99M SESTAMIBI GENERIC - CARDIOLITE
10.0000 | Freq: Once | INTRAVENOUS | Status: AC | PRN
  Administered 2012-09-27: 10 via INTRAVENOUS

## 2012-09-27 MED ORDER — TECHNETIUM TC 99M SESTAMIBI GENERIC - CARDIOLITE
30.0000 | Freq: Once | INTRAVENOUS | Status: AC | PRN
  Administered 2012-09-27: 30 via INTRAVENOUS

## 2012-09-27 NOTE — Progress Notes (Signed)
[facility] SITE 3 NUCLEAR MED [address] [place], [place] [address] [phone]    Cardiology Nuclear Med Study  Belinda Harper is a 72 y.o. female     MRN : [id number]     DOB: 10/07/1940  Procedure Date: 09/27/2012  Nuclear Med Background Indication for Stress Test:  Evaluation for Ischemia History:  '08 [id number], EF=66% Cardiac Risk Factors: Family History - CAD and Lipids  Symptoms:  Chest Tightness with Exertion (last date of chest discomfort was yesterday), DOE, Fatigue and Rapid HR   Nuclear Pre-Procedure Caffeine/Decaff Intake:  None NPO After: 10:00pm   Lungs:  Clear. O2 Sat: 98% on room air. IV 0.9% NS with Angio Cath:  20g  IV Site: R Antecubital  IV Started by:  Stanton Kidney, EMT-P  Chest Size (in):  36 Cup Size: B  Height: 5\' 8"  (1.727 m)  Weight:  152 lb (68.947 kg)  BMI:  Body mass index is 23.12 kg/(m^2). Tech Comments:  NA    Nuclear Med Study 1 or 2 day study: 1 day  Stress Test Type:  Stress  Reading MD: Dietrich Pates, MD  Order Authorizing Provider:  Olga Millers, MD  Resting Radionuclide: Technetium 77m Sestamibi  Resting Radionuclide Dose: 11.0 mCi   Stress Radionuclide:  Technetium 28m Sestamibi  Stress Radionuclide Dose: 33.0 mCi           Stress Protocol Rest HR: 52 Stress HR: 131  Rest BP: 145/66 Stress BP: 192/62  Exercise Time (min): 8:00 METS: 10.1   Predicted Max HR: 150 bpm % Max HR: 87.33 bpm Rate Pressure Product: 46962   Dose of Adenosine (mg):  n/a Dose of Lexiscan: n/a mg  Dose of Atropine (mg): n/a Dose of Dobutamine: n/a mcg/kg/min (at max HR)  Stress Test Technologist: Smiley Houseman, CMA-N  Nuclear Technologist:  Domenic Polite, CNMT     Rest Procedure:  Myocardial perfusion imaging was performed at rest 45 minutes following the intravenous administration of Technetium 23m Sestamibi.  Rest ECG: NSR - Normal EKG  Stress Procedure:  The patient exercised on the treadmill utilizing the Bruce Protocol for eight  minutes. The patient stopped due to fatigue and denied any chest pain.  Technetium 74m Sestamibi was injected at peak exercise and myocardial perfusion imaging was performed after a brief delay.  Stress ECG: No significant change from baseline ECG  QPS Raw Data Images:  Soft tissue (diaphragm, breast) surround heart. Stress Images:  Normal homogeneous uptake in all areas of the myocardium. Rest Images:  Normal homogeneous uptake in all areas of the myocardium. Subtraction (SDS):  No evidence of ischemia. Transient Ischemic Dilatation (Normal <1.22):  1.02 Lung/Heart Ratio (Normal <0.45):  0.23  Quantitative Gated Spect Images QGS EDV:  85 ml QGS ESV:  27 ml  Impression Exercise Capacity:  Good exercise capacity. BP Response:  Normal blood pressure response. Clinical Symptoms:  No chest pain. ECG Impression:  No significant ST segment change suggestive of ischemia. Comparison with Prior Nuclear Study:  No change from previous scan.  Overall Impression:  Normal stress nuclear study.  LV Ejection Fraction: 68%.  LV Wall Motion:  NL LV Function; NL Wall Motion  Dietrich Pates

## 2012-10-01 ENCOUNTER — Encounter: Payer: Self-pay | Admitting: Internal Medicine

## 2012-10-11 ENCOUNTER — Encounter: Payer: Self-pay | Admitting: Cardiology

## 2012-10-11 ENCOUNTER — Ambulatory Visit (INDEPENDENT_AMBULATORY_CARE_PROVIDER_SITE_OTHER): Payer: Medicare Other | Admitting: Cardiology

## 2012-10-11 VITALS — BP 124/66 | HR 60 | Ht 68.0 in | Wt 151.8 lb

## 2012-10-11 DIAGNOSIS — R0989 Other specified symptoms and signs involving the circulatory and respiratory systems: Secondary | ICD-10-CM

## 2012-10-11 DIAGNOSIS — E7800 Pure hypercholesterolemia, unspecified: Secondary | ICD-10-CM

## 2012-10-11 DIAGNOSIS — R0609 Other forms of dyspnea: Secondary | ICD-10-CM

## 2012-10-11 DIAGNOSIS — R0600 Dyspnea, unspecified: Secondary | ICD-10-CM | POA: Insufficient documentation

## 2012-10-11 NOTE — Assessment & Plan Note (Signed)
Management per primary care. 

## 2012-10-11 NOTE — Assessment & Plan Note (Signed)
Patient's symptoms persist. She feels that he may be pulmonary related. She is scheduled to see a pulmonologist. 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

## 2012-10-11 NOTE — Progress Notes (Signed)
HPI: 72 year old female I initially saw in May of 2014 for evaluation of dyspnea. CBC in March of 2014 showed a white blood cell count of 2.7, hemoglobin 11.0, MCV 89.4 and platelet count 180. Laboratories in April of 2014 showed normal renal function, normal liver functions and TSH. Myoview was performed in June of 2014 and perfusion was normal with an ejection fraction of 68%. Since she was last seen, she does have some dyspnea with more moderate activities. Not with routine activities. No orthopnea, PND or pedal edema. No chest pain.   Current Outpatient Prescriptions  Medication Sig Dispense Refill  . Calcium Citrate-Vitamin D (CITRACAL + D PO) Take 250 mg by mouth 2 (two) times daily. Has 250IU of vitamin D3 and 80mg  of magnesium      . docusate sodium (COLACE) 100 MG capsule Take 100 mg by mouth daily.      [name] EPINEPHrine (EPIPEN) 0.3 mg/0.3 mL DEVI Inject 0.3 mLs (0.3 mg total) into the muscle as needed.  1 Device  0  . FERROUS GLUCONATE PO Take 81 mg by mouth 2 (two) times daily.      . Glucos-Chondroit-Hyaluron-MSM (GLUCOSAMINE CHONDROITIN JOINT) TABS Take by mouth daily.      . Multiple Vitamins-Minerals (ONE-A-DAY WOMENS VITACRAVES PO) Take 1 tablet by mouth daily.      . vitamin C (ASCORBIC ACID) 500 MG tablet Take 500 mg by mouth 2 (two) times daily.      . vitamin D, CHOLECALCIFEROL, 400 UNITS tablet Take 400 Units by mouth daily. Pt takes on cloudy days only.       No current facility-administered medications for this visit.   Facility-Administered Medications Ordered in Other Visits  Medication Dose Route Frequency Provider Last Rate Last Dose  . 0.9 %  sodium chloride infusion   Intravenous Once Keitha Butte, NP         Past Medical History  Diagnosis Date  . Unspecified vitamin D deficiency 04/06/2007  . HYPERCHOLESTEROLEMIA 05/19/2008  . 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 HEMORRHOIDS 05/19/2008  .  ALLERGIC RHINITIS 01/03/2007  . ASTHMA 01/03/2007  . UTERINE PROLAPSE 04/06/2007  . OSTEOARTHRITIS 01/03/2007  . OSTEOPOROSIS 01/03/2007  . FRACTURE, ANKLE, LEFT 04/10/2009  . Blood transfusion 12 2010    hg 4 transfused 4 units  . Gastritis 2002  . DEPRESSION 01/03/2007    resolved    Past Surgical History  Procedure Laterality Date  . Panendoscopy      small bowel bx  . Ankle fracture surgery  02/2009    left ankle fracture and dislocation  . Cataract extraction w/ intraocular lens implant  2000    right eye  . Orif finger / thumb fracture  1970    reattached  . 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 Marital Status: Divorced    Spouse Name: N/A    Number of Children: 2  . Years of Education: N/A   Occupational History  . Not on file.   Social History Main Topics  . Smoking status: Never Smoker   . Smokeless tobacco: Never Used  . Alcohol Use: Yes     Comment: rare  . Drug Use: No  . Sexually Active: Not on  file   Other Topics Concern  . Not on file   Social History Narrative   HH of  2  Her and granddaughter    Retired Artist   Never smoked    Pet cat allergic    Divorced   Regular exericise  walking mowing the lawn    ROS: no fevers or chills, productive cough, hemoptysis, dysphasia, odynophagia, melena, hematochezia, dysuria, hematuria, rash, seizure activity, orthopnea, PND, pedal edema, claudication. Remaining systems are negative.  Physical Exam: Well-developed well-nourished in no acute distress.  Skin is warm and dry.  HEENT is normal.  Neck is supple.  Chest is clear to auscultation with normal expansion.  Cardiovascular exam is regular rate and rhythm. 1/6 systolic murmur Abdominal exam nontender or distended. No masses  palpated. Extremities show no edema. neuro grossly intact

## 2012-10-11 NOTE — Patient Instructions (Addendum)
Your physician recommends that you schedule a follow-up appointment in: AS NEEDED  

## 2012-10-18 ENCOUNTER — Encounter: Payer: Self-pay | Admitting: Internal Medicine

## 2012-10-18 ENCOUNTER — Ambulatory Visit (INDEPENDENT_AMBULATORY_CARE_PROVIDER_SITE_OTHER): Payer: Medicare Other | Admitting: Internal Medicine

## 2012-10-18 VITALS — BP 144/80 | HR 60 | Temp 98.1°F | Wt 151.0 lb

## 2012-10-18 DIAGNOSIS — Z23 Encounter for immunization: Secondary | ICD-10-CM

## 2012-10-18 DIAGNOSIS — R6889 Other general symptoms and signs: Secondary | ICD-10-CM

## 2012-10-18 DIAGNOSIS — R079 Chest pain, unspecified: Secondary | ICD-10-CM

## 2012-10-18 MED ORDER — BUDESONIDE-FORMOTEROL FUMARATE 160-4.5 MCG/ACT IN AERO: 2.0000 | INHALATION_SPRAY | Freq: Two times a day (BID) | RESPIRATORY_TRACT | Status: DC

## 2012-10-18 NOTE — Patient Instructions (Signed)
Try inhaler for the next 3=4 weeks  And see how you feel.  If this is helpful we may be able to use  Some med pre exercise  Or such. reflux problems can cause sx but usually not exercise related .

## 2012-10-18 NOTE — Progress Notes (Signed)
Chief Complaint  Patient presents with  . Follow-up    HPI:  Patient comes in for followup of decreased exercise tolerance and symptoms. Since her last visit Had nl nuclear stress tests.  Per Dr Jens Som . That was reassuring to her. 2 ligament back on an as-needed basis Had pfts done also. No new symptoms but she does describe the symptoms as needed chest discomfort sometimes triggering a cough drainage throat clearing she has a history of asthma when she was younger. No heartburn or other reflux at this time. ROS: See pertinent positives and negatives per HPI.  Past Medical History  Diagnosis Date  . Unspecified vitamin D deficiency 04/06/2007  . HYPERCHOLESTEROLEMIA 05/19/2008  . 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 HEMORRHOIDS 05/19/2008  . ALLERGIC RHINITIS 01/03/2007  . ASTHMA 01/03/2007  . UTERINE PROLAPSE 04/06/2007  . OSTEOARTHRITIS 01/03/2007  . OSTEOPOROSIS 01/03/2007  . FRACTURE, ANKLE, LEFT 04/10/2009  . Blood transfusion 12 2010    hg 4 transfused 4 units  . Gastritis 2002  . DEPRESSION 01/03/2007    resolved  . Normal cardiac stress test     2014    Family History  Problem Relation Age of Onset  . Hyperlipidemia Mother   . Heart disease Mother     CABG age 33  . Ovarian cancer Mother   . Uterine cancer Mother   . Arthritis Mother   . Osteoporosis Mother   . Heart disease Father     CABG age 36  . Arthritis Father   . Melanoma Father     History   Social History  . Marital Status: Divorced    Spouse Name: N/A    Number of Children: 2  . Years of Education: N/A   Social History Main Topics  . Smoking status: Never Smoker   . Smokeless tobacco: Never Used  . Alcohol Use: Yes     Comment: rare  . Drug Use: No  . Sexually Active: None   Other Topics Concern  . 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 Calcium Citrate-Vitamin D (CITRACAL + D PO) Take 250 mg by mouth 2 (two) times daily. Has 250IU of vitamin D3 and 80mg  of magnesium      . docusate sodium (COLACE) 100 MG capsule Take 100 mg by mouth daily.      [name] EPINEPHrine (EPIPEN) 0.3 mg/0.3 mL DEVI Inject 0.3 mLs (0.3 mg total) into the muscle as needed.  1 Device  0  . FERROUS GLUCONATE PO Take 81 mg by mouth 2 (two) times daily.      . Glucos-Chondroit-Hyaluron-MSM (GLUCOSAMINE CHONDROITIN JOINT) TABS Take by mouth daily.      . Multiple Vitamins-Minerals (ONE-A-DAY WOMENS VITACRAVES PO) Take 1 tablet by mouth daily.      . vitamin C (ASCORBIC ACID) 500 MG tablet Take 500 mg by mouth 2 (two) times daily.      . budesonide-formoterol (SYMBICORT) 160-4.5 MCG/ACT inhaler Inhale 2 puffs into the lungs 2 (two) times daily.  1 Inhaler  1  . [DISCONTINUED] vitamin D, CHOLECALCIFEROL, 400 UNITS tablet Take 400 Units by mouth daily. Pt takes  on cloudy days only.       Facility-Administered Encounter Medications as of 10/18/2012  Medication Dose Route Frequency Provider Last Rate Last Dose  . 0.9 %  sodium chloride infusion   Intravenous Once Keitha Butte, NP        EXAM:  BP 144/80  Pulse 60  Temp(Src) 98.1 F (36.7 C) (Oral)  Wt 151 lb (68.493 kg)  BMI 22.96 kg/m2  SpO2 98%  Body mass index is 22.96 kg/(m^2).  GENERAL: vitals reviewed and listed above, alert, oriented, appears well hydrated and in no acute distress  HEENT: atraumatic, conjunctiva  clear, no obvious abnormalities on inspection of external nose and ears NECK: no obvious masses on inspection palpation   LUNGS: clear to auscultation bilaterally, no wheezes, rales or rhonchi, good air movement  CV: HRRR, no clubbing cyanosis no gallops or murmurs  MS: moves all extremities without noticeable focal   abnormality  PSYCH: pleasant and cooperative, no obvious depression or anxiety Lab Results  Component Value Date   WBC 2.7* 07/18/2012   HGB 11.0* 07/18/2012   HCT 34.6* 07/18/2012   PLT 180 07/18/2012   GLUCOSE 80 08/06/2012   CHOL 233* 08/06/2012   TRIG 65.0 08/06/2012   HDL 70.90 08/06/2012   LDLDIRECT 141.9 08/06/2012   ALT 15 08/06/2012   AST 24 08/06/2012   NA 141 08/06/2012   K 4.6 08/06/2012   CL 104 08/06/2012   CREATININE 0.7 08/06/2012   BUN 25* 08/06/2012   CO2 30 08/06/2012   TSH 0.58 08/06/2012   INR 1.04 04/10/2009   Reviewed stress test pulmonary function test shows mild obstructive pattern with very slight improvement with bronchodilator. Borderline diffusion and lung capacity. 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 Reassured her that very small amount of topical steroid would not be significant problem for her health with this trial. She did have a pneumococcal vaccine but it was right before her 66th birthday multiple reminders are being sent her for my chart we will repeat this today.  counseled and reviewed  infromation today .  -Patient advised to return or notify health care team  if symptoms worsen or persist or new concerns arise.  Patient Instructions  Try inhaler for the next 3=4 weeks  And see how you feel.  If this is helpful we may be able to use  Some med pre exercise  Or such. reflux problems can cause sx but usually not exercise related .     Neta Mends. Jaxden Blyden M.D.

## 2012-11-15 ENCOUNTER — Ambulatory Visit: Payer: Medicare Other | Admitting: Internal Medicine

## 2012-11-21 ENCOUNTER — Encounter: Payer: Self-pay | Admitting: Internal Medicine

## 2012-11-21 ENCOUNTER — Other Ambulatory Visit (HOSPITAL_BASED_OUTPATIENT_CLINIC_OR_DEPARTMENT_OTHER): Payer: Medicare Other | Admitting: Lab

## 2012-11-21 ENCOUNTER — Ambulatory Visit (INDEPENDENT_AMBULATORY_CARE_PROVIDER_SITE_OTHER): Payer: Medicare Other | Admitting: Internal Medicine

## 2012-11-21 VITALS — BP 158/84 | HR 55 | Temp 98.2°F | Wt 157.0 lb

## 2012-11-21 DIAGNOSIS — R0609 Other forms of dyspnea: Secondary | ICD-10-CM

## 2012-11-21 DIAGNOSIS — H81399 Other peripheral vertigo, unspecified ear: Secondary | ICD-10-CM | POA: Insufficient documentation

## 2012-11-21 DIAGNOSIS — D72819 Decreased white blood cell count, unspecified: Secondary | ICD-10-CM

## 2012-11-21 DIAGNOSIS — D509 Iron deficiency anemia, unspecified: Secondary | ICD-10-CM

## 2012-11-21 DIAGNOSIS — R030 Elevated blood-pressure reading, without diagnosis of hypertension: Secondary | ICD-10-CM

## 2012-11-21 DIAGNOSIS — R0989 Other specified symptoms and signs involving the circulatory and respiratory systems: Secondary | ICD-10-CM

## 2012-11-21 DIAGNOSIS — D649 Anemia, unspecified: Secondary | ICD-10-CM

## 2012-11-21 LAB — CBC & DIFF AND RETIC
BASO%: 1 % (ref 0.0–2.0)
Basophils Absolute: 0 10*3/uL (ref 0.0–0.1)
EOS%: 5 % (ref 0.0–7.0)
Eosinophils Absolute: 0.2 10*3/uL (ref 0.0–0.5)
HCT: 33.7 % — ABNORMAL LOW (ref 34.8–46.6)
HGB: 10.2 g/dL — ABNORMAL LOW (ref 11.6–15.9)
Immature Retic Fract: 8.1 % (ref 1.60–10.00)
LYMPH%: 31.7 % (ref 14.0–49.7)
MCH: 27.7 pg (ref 25.1–34.0)
MCHC: 30.3 g/dL — ABNORMAL LOW (ref 31.5–36.0)
MCV: 91.6 fL (ref 79.5–101.0)
MONO#: 0.3 10*3/uL (ref 0.1–0.9)
MONO%: 8.7 % (ref 0.0–14.0)
NEUT#: 1.6 10*3/uL (ref 1.5–6.5)
NEUT%: 53.6 % (ref 38.4–76.8)
Platelets: 195 10*3/uL (ref 145–400)
RBC: 3.68 10*6/uL — ABNORMAL LOW (ref 3.70–5.45)
RDW: 17.1 % — ABNORMAL HIGH (ref 11.2–14.5)
Retic %: 1.63 % (ref 0.70–2.10)
Retic Ct Abs: 59.98 10*3/uL (ref 33.70–90.70)
WBC: 3 10*3/uL — ABNORMAL LOW (ref 3.9–10.3)
lymph#: 1 10*3/uL (ref 0.9–3.3)

## 2012-11-21 NOTE — Patient Instructions (Addendum)
Try off the inhaler     If  Progressing persistence and not better in about a month symptoms   We may have you see  Pulmonary  Doctor.   Sounds like  Vertigo   Like inner ear .     Problem  should resolve with time. However if   persistent or progressive we may have  You see ent or vertigo therapy. Can use   otc meds if needed for sx  Such as meclizine but  This can cause drowsiness and risk of falling.   Check blood pressure readings  To make sure blood pressure  is good .    Contact if still up.     Benign Positional Vertigo Vertigo means you feel like you or your surroundings are moving when they are not. Benign positional vertigo is the most common form of vertigo. Benign means that the cause of your condition is not serious. Benign positional vertigo is more common in older adults. CAUSES  Benign positional vertigo is the result of an upset in the labyrinth system. This is an area in the middle ear that helps control your balance. This may be caused by a viral infection, head injury, or repetitive motion. However, often no specific cause is found. SYMPTOMS  Symptoms of benign positional vertigo occur when you move your head or eyes in different directions. Some of the symptoms may include:  Loss of balance and falls.  Vomiting.  Blurred vision.  Dizziness.  Nausea.  Involuntary eye movements (nystagmus). DIAGNOSIS  Benign positional vertigo is usually diagnosed by physical exam. If the specific cause of your benign positional vertigo is unknown, your caregiver may perform imaging tests, such as magnetic resonance imaging (MRI) or computed tomography (CT). TREATMENT  Your caregiver may recommend movements or procedures to correct the benign positional vertigo. Medicines such as meclizine, benzodiazepines, and medicines for nausea may be used to treat your symptoms. In rare cases, if your symptoms are caused by certain conditions that affect the inner ear, you may need  surgery. HOME CARE INSTRUCTIONS   Follow your caregiver's instructions.  Move slowly. Do not make sudden body or head movements.  Avoid driving.  Avoid operating heavy machinery.  Avoid performing any tasks that would be dangerous to you or others during a vertigo episode.  Drink enough fluids to keep your urine clear or pale yellow. SEEK IMMEDIATE MEDICAL CARE IF:   You develop problems with walking, weakness, numbness, or using your arms, hands, or legs.  You have difficulty speaking.  You develop severe headaches.  Your nausea or vomiting continues or gets worse.  You develop visual changes.  Your family or friends notice any behavioral changes.  Your condition gets worse.  You have a fever.  You develop a stiff neck or sensitivity to light. MAKE SURE YOU:   Understand these instructions.  Will watch your condition.  Will get help right away if you are not doing well or get worse. Document Released: 01/17/2006 Document Revised: 07/04/2011 Document Reviewed: 12/30/2010 [facility] Patient Information 2014 [place], [place]. Vertigo Vertigo means you feel like you or your surroundings are moving when they are not. Vertigo can be dangerous if it occurs when you are at work, driving, or performing difficult activities.  CAUSES  Vertigo occurs when there is a conflict of signals sent to your brain from the visual and sensory systems in your body. There are many different causes of vertigo, including:  Infections, especially in the inner ear.  A  bad reaction to a drug or misuse of alcohol and medicines.  Withdrawal from drugs or alcohol.  Rapidly changing positions, such as lying down or rolling over in bed.  A migraine headache.  Decreased blood flow to the brain.  Increased pressure in the brain from a head injury, infection, tumor, or bleeding. SYMPTOMS  You may feel as though the world is spinning around or you are falling to the ground. Because your balance  is upset, vertigo can cause nausea and vomiting. You may have involuntary eye movements (nystagmus). DIAGNOSIS  Vertigo is usually diagnosed by physical exam. If the cause of your vertigo is unknown, your caregiver may perform imaging tests, such as an MRI scan (magnetic resonance imaging). TREATMENT  Most cases of vertigo resolve on their own, without treatment. Depending on the cause, your caregiver may prescribe certain medicines. If your vertigo is related to body position issues, your caregiver may recommend movements or procedures to correct the problem. In rare cases, if your vertigo is caused by certain inner ear problems, you may need surgery. HOME CARE INSTRUCTIONS   Follow your caregiver's instructions.  Avoid driving.  Avoid operating heavy machinery.  Avoid performing any tasks that would be dangerous to you or others during a vertigo episode.  Tell your caregiver if you notice that certain medicines seem to be causing your vertigo. Some of the medicines used to treat vertigo episodes can actually make them worse in some people. SEEK IMMEDIATE MEDICAL CARE IF:   Your medicines do not relieve your vertigo or are making it worse.  You develop problems with talking, walking, weakness, or using your arms, hands, or legs.  You develop severe headaches.  Your nausea or vomiting continues or gets worse.  You develop visual changes.  A family member notices behavioral changes.  Your condition gets worse. MAKE SURE YOU:  Understand these instructions.  Will watch your condition.  Will get help right away if you are not doing well or get worse. Document Released: 01/19/2005 Document Revised: 07/04/2011 Document Reviewed: 10/28/2010 [facility] Patient Information 2014 [place], [place].

## 2012-11-21 NOTE — Progress Notes (Signed)
Chief Complaint  Patient presents with  . Follow-up    HPI: Patient comes in for followup. On shortness of breath after a trial of Symbicort inhale or since last visit  Usually taking am dose  And sometimes misses the after noon dose    Uncertain if helping.  Uncertain if getting into lungs.  Rinses.  Mouth but doesn't feel anything in her mouth. Her lungs may be a bit better but it could be from the decreased humidity. New symptom;  Episodes of dizzy feeling like when she had an acute inner ear problem when younger. When she got up out of bed there was a spinning sensation bvertigo  Mild nausea. And ever so slight slight headache  in the front of the head. No diplopia numbness falling although feels that her balance was off it got better and then worse yesterday am better today  When she had her hair done yesterday and put her head back it aggravated it.  No fever active vomiting numbness or weakness.  She was on a trip this past weekend to visit her son temperatures were 100 but she wasn't mouth and it very long period.  Blood pressure readings at home have been okay 120 [birth year]1928 range. Her monitor has been checked in the office. ROS: See pertinent positives and negatives per HPI. No bleeding  . Getting anemia check today.  No uri sx no fever   Past Medical History  Diagnosis Date  . Unspecified vitamin D deficiency 04/06/2007  . HYPERCHOLESTEROLEMIA 05/19/2008  . 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 HEMORRHOIDS 05/19/2008  . ALLERGIC RHINITIS 01/03/2007  . ASTHMA 01/03/2007  . UTERINE PROLAPSE 04/06/2007  . OSTEOARTHRITIS 01/03/2007  . OSTEOPOROSIS 01/03/2007  . FRACTURE, ANKLE, LEFT 04/10/2009  . Blood transfusion 12 2010    hg 4 transfused 4 units  . Gastritis 2002  . DEPRESSION 01/03/2007    resolved  . Normal cardiac stress test     2014    Family History  Problem Relation Age of Onset  . Hyperlipidemia  Mother   . Heart disease Mother     CABG age 40  . Ovarian cancer Mother   . Uterine cancer Mother   . Arthritis Mother   . Osteoporosis Mother   . Heart disease Father     CABG age 64  . Arthritis Father   . Melanoma Father     History   Social History  . Marital Status: Divorced    Spouse Name: N/A    Number of Children: 2  . Years of Education: N/A   Social History Main Topics  . Smoking status: Never Smoker   . Smokeless tobacco: Never Used  . Alcohol Use: Yes     Comment: rare  . Drug Use: No  . Sexually Active: None   Other Topics Concern  . None   Social History Narrative   HH of  2  Her and granddaughter    Retired Artist   Never smoked    Pet cat allergic    Divorced   Regular exericise  walking mowing the lawn    Outpatient Encounter Prescriptions as of 11/21/2012  Medication Sig Dispense Refill  . budesonide-formoterol (SYMBICORT) 160-4.5 MCG/ACT inhaler Inhale 2 puffs into the lungs 2 (two) times daily.  1 Inhaler  1  . Calcium Citrate-Vitamin D (CITRACAL + D PO) Take 250 mg by mouth 2 (two) times daily. Has 250IU of  vitamin D3 and 80mg  of magnesium      . docusate sodium (COLACE) 100 MG capsule Take 100 mg by mouth daily.      [name] EPINEPHrine (EPIPEN) 0.3 mg/0.3 mL DEVI Inject 0.3 mLs (0.3 mg total) into the muscle as needed.  1 Device  0  . FERROUS GLUCONATE PO Take 81 mg by mouth 2 (two) times daily.      . Glucos-Chondroit-Hyaluron-MSM (GLUCOSAMINE CHONDROITIN JOINT) TABS Take by mouth daily.      . Multiple Vitamins-Minerals (ONE-A-DAY WOMENS VITACRAVES PO) Take 1 tablet by mouth daily.      . vitamin C (ASCORBIC ACID) 500 MG tablet Take 500 mg by mouth 2 (two) times daily.       Facility-Administered Encounter Medications as of 11/21/2012  Medication Dose Route Frequency Provider Last Rate Last Dose  . 0.9 %  sodium chloride infusion   Intravenous Once Keitha Butte, NP        EXAM:  BP 158/84  Pulse 55  Temp(Src) 98.2 F (36.8  C) (Oral)  Wt 157 lb (71.215 kg)  BMI 23.88 kg/m2  SpO2 99%  Body mass index is 23.88 kg/(m^2).  GENERAL: vitals reviewed and listed above, alert, oriented, appears well hydrated and in no acute distress  HEENT: atraumatic, conjunctiva  clear, no obvious abnormalities on inspection of external nose and ears  eoms are full    No nystagmus noted   OP : no lesion edema or exudate tongue midline   tms grewy no infection face non tender NECK: no obvious masses on inspection palpation  LUNGS: clear to auscultation bilaterally, no wheezes, rales or rhonchi, good air movement CV: HRRR, no clubbing cyanosis or  peripheral edema nl cap refill slight sem usb no radaition MS: moves all extremities without noticeable focal  abnormality Neuro nl gait although tends to hold on when turning.  PSYCH: pleasant and cooperative, no obvious depression or anxiety ASSESSMENT AND PLAN:  Discussed the following assessment and plan:  Dyspnea - poss rad asthmatic  uncertain response ot inhaler   can try off and refer if continue ing . anemia check planned for today  Vertigo, peripheral, unspecified laterality - prob labrynthine   non focal exam,  exp managment and fu if needed   Elevated blood pressure reading - normal at home by hx  pt to confrim this  -Patient advised to return or notify health care team  if symptoms worsen or persist or new concerns arise.  Patient Instructions  Try off the inhaler     If  Progressing persistence and not better in about a month symptoms   We may have you see  Pulmonary  Doctor.   Sounds like  Vertigo   Like inner ear .     Problem  should resolve with time. However if   persistent or progressive we may have  You see ent or vertigo therapy. Can use   otc meds if needed for sx  Such as meclizine but  This can cause drowsiness and risk of falling.   Check blood pressure readings  To make sure blood pressure  is good .    Contact if still up.     Benign Positional  Vertigo Vertigo means you feel like you or your surroundings are moving when they are not. Benign positional vertigo is the most common form of vertigo. Benign means that the cause of your condition is not serious. Benign positional vertigo is more common in older adults. CAUSES  Benign positional vertigo is the result of an upset in the labyrinth system. This is an area in the middle ear that helps control your balance. This may be caused by a viral infection, head injury, or repetitive motion. However, often no specific cause is found. SYMPTOMS  Symptoms of benign positional vertigo occur when you move your head or eyes in different directions. Some of the symptoms may include:  Loss of balance and falls.  Vomiting.  Blurred vision.  Dizziness.  Nausea.  Involuntary eye movements (nystagmus). DIAGNOSIS  Benign positional vertigo is usually diagnosed by physical exam. If the specific cause of your benign positional vertigo is unknown, your caregiver may perform imaging tests, such as magnetic resonance imaging (MRI) or computed tomography (CT). TREATMENT  Your caregiver may recommend movements or procedures to correct the benign positional vertigo. Medicines such as meclizine, benzodiazepines, and medicines for nausea may be used to treat your symptoms. In rare cases, if your symptoms are caused by certain conditions that affect the inner ear, you may need surgery. HOME CARE INSTRUCTIONS   Follow your caregiver's instructions.  Move slowly. Do not make sudden body or head movements.  Avoid driving.  Avoid operating heavy machinery.  Avoid performing any tasks that would be dangerous to you or others during a vertigo episode.  Drink enough fluids to keep your urine clear or pale yellow. SEEK IMMEDIATE MEDICAL CARE IF:   You develop problems with walking, weakness, numbness, or using your arms, hands, or legs.  You have difficulty speaking.  You develop severe  headaches.  Your nausea or vomiting continues or gets worse.  You develop visual changes.  Your family or friends notice any behavioral changes.  Your condition gets worse.  You have a fever.  You develop a stiff neck or sensitivity to light. MAKE SURE YOU:   Understand these instructions.  Will watch your condition.  Will get help right away if you are not doing well or get worse. Document Released: 01/17/2006 Document Revised: 07/04/2011 Document Reviewed: 12/30/2010 [facility] Patient Information 2014 [place], [place]. Vertigo Vertigo means you feel like you or your surroundings are moving when they are not. Vertigo can be dangerous if it occurs when you are at work, driving, or performing difficult activities.  CAUSES  Vertigo occurs when there is a conflict of signals sent to your brain from the visual and sensory systems in your body. There are many different causes of vertigo, including:  Infections, especially in the inner ear.  A bad reaction to a drug or misuse of alcohol and medicines.  Withdrawal from drugs or alcohol.  Rapidly changing positions, such as lying down or rolling over in bed.  A migraine headache.  Decreased blood flow to the brain.  Increased pressure in the brain from a head injury, infection, tumor, or bleeding. SYMPTOMS  You may feel as though the world is spinning around or you are falling to the ground. Because your balance is upset, vertigo can cause nausea and vomiting. You may have involuntary eye movements (nystagmus). DIAGNOSIS  Vertigo is usually diagnosed by physical exam. If the cause of your vertigo is unknown, your caregiver may perform imaging tests, such as an MRI scan (magnetic resonance imaging). TREATMENT  Most cases of vertigo resolve on their own, without treatment. Depending on the cause, your caregiver may prescribe certain medicines. If your vertigo is related to body position issues, your caregiver may recommend movements  or procedures to correct the problem. In rare cases, if your  vertigo is caused by certain inner ear problems, you may need surgery. HOME CARE INSTRUCTIONS   Follow your caregiver's instructions.  Avoid driving.  Avoid operating heavy machinery.  Avoid performing any tasks that would be dangerous to you or others during a vertigo episode.  Tell your caregiver if you notice that certain medicines seem to be causing your vertigo. Some of the medicines used to treat vertigo episodes can actually make them worse in some people. SEEK IMMEDIATE MEDICAL CARE IF:   Your medicines do not relieve your vertigo or are making it worse.  You develop problems with talking, walking, weakness, or using your arms, hands, or legs.  You develop severe headaches.  Your nausea or vomiting continues or gets worse.  You develop visual changes.  A family member notices behavioral changes.  Your condition gets worse. MAKE SURE YOU:  Understand these instructions.  Will watch your condition.  Will get help right away if you are not doing well or get worse. Document Released: 01/19/2005 Document Revised: 07/04/2011 Document Reviewed: 10/28/2010 [facility] Patient Information 2014 [place], [place].      Neta Mends. Panosh M.D. bp good at home

## 2012-11-22 LAB — IRON AND TIBC CHCC
%SAT: 6 % — ABNORMAL LOW (ref 21–57)
Iron: 20 ug/dL — ABNORMAL LOW (ref 41–142)
TIBC: 311 ug/dL (ref 236–444)
UIBC: 291 ug/dL (ref 120–384)

## 2012-11-22 LAB — CMV IGM: CMV IgM: <8 AU/mL (ref <30.00)

## 2012-11-22 LAB — EPSTEIN-BARR VIRUS VCA, IGG: EBV VCA IgG: 166 U/mL — ABNORMAL HIGH (ref <18.0)

## 2012-11-22 LAB — EPSTEIN-BARR VIRUS VCA, IGM: EBV VCA IgM: <10 U/mL (ref <36.0)

## 2012-11-22 LAB — FERRITIN CHCC: Ferritin: 24 ng/ml (ref 9–269)

## 2012-11-27 ENCOUNTER — Telehealth: Payer: Self-pay | Admitting: Oncology

## 2012-11-27 ENCOUNTER — Ambulatory Visit (HOSPITAL_BASED_OUTPATIENT_CLINIC_OR_DEPARTMENT_OTHER): Payer: Medicare Other | Admitting: Oncology

## 2012-11-27 VITALS — BP 140/54 | HR 68 | Temp 98.2°F | Resp 18 | Ht 68.0 in | Wt 154.6 lb

## 2012-11-27 DIAGNOSIS — D649 Anemia, unspecified: Secondary | ICD-10-CM

## 2012-11-27 DIAGNOSIS — R6889 Other general symptoms and signs: Secondary | ICD-10-CM

## 2012-11-27 DIAGNOSIS — D509 Iron deficiency anemia, unspecified: Secondary | ICD-10-CM

## 2012-11-27 DIAGNOSIS — D72819 Decreased white blood cell count, unspecified: Secondary | ICD-10-CM

## 2012-11-27 DIAGNOSIS — K9089 Other intestinal malabsorption: Secondary | ICD-10-CM

## 2012-11-27 NOTE — Progress Notes (Signed)
Hematology and Oncology Follow Up Visit  Belinda Harper [id number] [birth year] 72 y.o. 11/27/2012 2:59 PM   Principle Diagnosis: Encounter Diagnoses  Name Primary?  Gustavo Lah DEFICIENCY Yes  . Anemia, normocytic normochromic   . Decreased exercise tolerance      Interim History:   Follow up visit for this 72 year old woman with multifactorial anemia and associated leukopenia. 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 The chronic anemia and leukopenia go back as far as we have records (2007) when white counts ran as low as 2700. She has a normal differential. White count average is 3000 and has not changed. Platelet count is normal. Best hemoglobin she achieves with parenteral iron replacement is 11 g.she had a  nondiagnostic bone marrow biopsy done in 2006. At time of her last visit here, I screened her for CMV and EBV. She has been exposed to EBV but does not have elevated IgM titers.    Medications: reviewed  Allergies:  Allergies  Allergen Reactions  . Other     Bee sting  . Sulfa Antibiotics   . Sulfamethoxazole     REACTION: unspecified    Review of Systems: Constitutional:   Intermittent fatigue Respiratory:no cough or dyspnea Cardiovascular:  No chest pain or palpitations Gastrointestinal:no abdominal pain or change in bowel habit Genito-Urinary: no urinary tract symptoms Musculoskeletal:no musculoskeletal complaints Neurologic:no headache or change in vision Skin: Remaining ROS negative.  Physical Exam: Blood pressure 140/54, pulse 68, temperature 98.2 F (36.8 C), temperature source Oral, resp. rate 18, height 5\' 8"  (1.727 m), weight 154 lb 9.6 oz (70.126 kg). Wt Readings from Last 3 Encounters:  11/27/12 154 lb 9.6 oz (70.126 kg)  11/21/12 157 lb (71.215 kg)  10/18/12 151 lb (68.493 kg)     General appearance: thin Caucasian woman HENNT: pharynx no erythema or exudate Lymph nodes: no  adenopathy Breasts: Lungs:clear to auscultation resonant to percussion Heart:regular rhythm. 1-2/6 aortic systolic murmur Abdomen:soft, nontender, no mass, no organomegaly Extremities:no edema, no calf tenderness Musculoskeletal:no joint deformities GU: Vascular:no cyanosis Neurologic: grossly normal full exam not done today see previous note Skin:  Lab Results: Lab Results  Component Value Date   WBC 3.0* 11/21/2012   HGB 10.2* 11/21/2012   HCT 33.7* 11/21/2012   MCV 91.6 11/21/2012   PLT 195 11/21/2012     Chemistry      Component Value Date/Time   NA 141 08/06/2012 1041   NA 142 05/28/2012 1600   K 4.6 08/06/2012 1041   K 3.7 05/28/2012 1600   CL 104 08/06/2012 1041   CL 103 05/28/2012 1600   CO2 30 08/06/2012 1041   CO2 29 05/28/2012 1600   BUN 25* 08/06/2012 1041   BUN 19.3 05/28/2012 1600   CREATININE 0.7 08/06/2012 1041   CREATININE 0.8 05/28/2012 1600      Component Value Date/Time   CALCIUM 9.4 08/06/2012 1041   CALCIUM 9.5 05/28/2012 1600   CALCIUM 9.5 08/03/2011 1210   ALKPHOS 64 08/06/2012 1041   ALKPHOS 82 05/28/2012 1600   AST 24 08/06/2012 1041   AST 23 05/28/2012 1600   ALT 15 08/06/2012 1041   ALT 13 05/28/2012 1600   BILITOT 0.2* 08/06/2012 1041   BILITOT <0.20 05/28/2012 1600     Ferritin 24 on 11/21/2012    Impression: 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 Ferritin  is running low normal. I will check labs every 3 months. Iron infusion when necessary.   CC:.    Levert Feinstein, MD 8/5/20142:59 PM

## 2012-11-27 NOTE — Telephone Encounter (Signed)
gv and printed appt sched and avs for pt  °

## 2013-02-20 ENCOUNTER — Other Ambulatory Visit (HOSPITAL_BASED_OUTPATIENT_CLINIC_OR_DEPARTMENT_OTHER): Payer: Medicare Other | Admitting: Lab

## 2013-02-20 DIAGNOSIS — D509 Iron deficiency anemia, unspecified: Secondary | ICD-10-CM

## 2013-02-20 LAB — CBC WITH DIFFERENTIAL/PLATELET
BASO%: 1.1 % (ref 0.0–2.0)
Basophils Absolute: 0 10*3/uL (ref 0.0–0.1)
EOS%: 4.2 % (ref 0.0–7.0)
Eosinophils Absolute: 0.2 10*3/uL (ref 0.0–0.5)
HCT: 29 % — ABNORMAL LOW (ref 34.8–46.6)
HGB: 9.1 g/dL — ABNORMAL LOW (ref 11.6–15.9)
LYMPH%: 26.5 % (ref 14.0–49.7)
MCH: 27.1 pg (ref 25.1–34.0)
MCHC: 31.4 g/dL — ABNORMAL LOW (ref 31.5–36.0)
MCV: 86.3 fL (ref 79.5–101.0)
MONO#: 0.3 10*3/uL (ref 0.1–0.9)
MONO%: 7 % (ref 0.0–14.0)
NEUT#: 2.2 10*3/uL (ref 1.5–6.5)
NEUT%: 61.2 % (ref 38.4–76.8)
Platelets: 257 10*3/uL (ref 145–400)
RBC: 3.36 10*6/uL — ABNORMAL LOW (ref 3.70–5.45)
RDW: 16 % — ABNORMAL HIGH (ref 11.2–14.5)
WBC: 3.7 10*3/uL — ABNORMAL LOW (ref 3.9–10.3)
lymph#: 1 10*3/uL (ref 0.9–3.3)

## 2013-02-21 LAB — IRON AND TIBC CHCC
%SAT: 11 % — ABNORMAL LOW (ref 21–57)
Iron: 34 ug/dL — ABNORMAL LOW (ref 41–142)
TIBC: 301 ug/dL (ref 236–444)
UIBC: 268 ug/dL (ref 120–384)

## 2013-02-21 LAB — FERRITIN CHCC: Ferritin: 28 ng/ml (ref 9–269)

## 2013-02-28 ENCOUNTER — Other Ambulatory Visit: Payer: Self-pay

## 2013-03-04 ENCOUNTER — Telehealth: Payer: Self-pay | Admitting: *Deleted

## 2013-03-04 NOTE — Telephone Encounter (Signed)
Received vm call from 02/28/13 transferred from Scheduler/Ann stating that she had lab work done wed, 1 wk ago & no one has called with lab results nor has she seen results on MyChart & wonders when they will be released.  Note to Dr Cyndie Chime.

## 2013-03-06 ENCOUNTER — Telehealth: Payer: Self-pay | Admitting: *Deleted

## 2013-03-06 NOTE — Telephone Encounter (Signed)
This office left vm on home and cell # regarding her lab results and requested pt to call office for more information.

## 2013-05-15 ENCOUNTER — Other Ambulatory Visit (HOSPITAL_BASED_OUTPATIENT_CLINIC_OR_DEPARTMENT_OTHER): Payer: Medicare Other

## 2013-05-15 ENCOUNTER — Other Ambulatory Visit: Payer: Medicare Other

## 2013-05-15 DIAGNOSIS — D509 Iron deficiency anemia, unspecified: Secondary | ICD-10-CM

## 2013-05-15 LAB — CBC WITH DIFFERENTIAL/PLATELET
BASO%: 1.1 % (ref 0.0–2.0)
Basophils Absolute: 0 10*3/uL (ref 0.0–0.1)
EOS%: 2.8 % (ref 0.0–7.0)
Eosinophils Absolute: 0.1 10*3/uL (ref 0.0–0.5)
HCT: 28.5 % — ABNORMAL LOW (ref 34.8–46.6)
HGB: 9 g/dL — ABNORMAL LOW (ref 11.6–15.9)
LYMPH%: 25.3 % (ref 14.0–49.7)
MCH: 27.8 pg (ref 25.1–34.0)
MCHC: 31.6 g/dL (ref 31.5–36.0)
MCV: 88.2 fL (ref 79.5–101.0)
MONO#: 0.4 10*3/uL (ref 0.1–0.9)
MONO%: 8.4 % (ref 0.0–14.0)
NEUT#: 2.6 10*3/uL (ref 1.5–6.5)
NEUT%: 62.4 % (ref 38.4–76.8)
Platelets: 238 10*3/uL (ref 145–400)
RBC: 3.24 10*6/uL — ABNORMAL LOW (ref 3.70–5.45)
RDW: 18.8 % — ABNORMAL HIGH (ref 11.2–14.5)
WBC: 4.2 10*3/uL (ref 3.9–10.3)
lymph#: 1.1 10*3/uL (ref 0.9–3.3)

## 2013-05-16 LAB — IRON AND TIBC CHCC
%SAT: 10 % — ABNORMAL LOW (ref 21–57)
Iron: 30 ug/dL — ABNORMAL LOW (ref 41–142)
TIBC: 320 ug/dL (ref 236–444)
UIBC: 290 ug/dL (ref 120–384)

## 2013-05-16 LAB — FERRITIN CHCC: Ferritin: 29 ng/ml (ref 9–269)

## 2013-05-21 ENCOUNTER — Telehealth: Payer: Self-pay | Admitting: *Deleted

## 2013-05-21 NOTE — Telephone Encounter (Signed)
Message copied by Domenic Schwab on Tue May 21, 2013  4:31 PM ------      Message from: Annia Belt      Created: Mon May 20, 2013  8:06 PM       Call pt: hemoglobin and iron studies about the same as 2 months ago; no need for IV iron right now ------

## 2013-05-21 NOTE — Telephone Encounter (Signed)
Per MD, called and spoke with pt informing her hemoglobin and iron studies about the same as 2 months ago; no need for IV iron right now.  Pt verbalized understanding and confirmed appt with Ned Card, NP 05/29/13.

## 2013-05-29 ENCOUNTER — Telehealth: Payer: Self-pay | Admitting: Hematology and Oncology

## 2013-05-29 ENCOUNTER — Ambulatory Visit (HOSPITAL_BASED_OUTPATIENT_CLINIC_OR_DEPARTMENT_OTHER): Payer: Medicare Other | Admitting: Nurse Practitioner

## 2013-05-29 VITALS — BP 132/69 | HR 72 | Temp 97.4°F | Resp 18 | Ht 68.0 in | Wt 157.6 lb

## 2013-05-29 DIAGNOSIS — D649 Anemia, unspecified: Secondary | ICD-10-CM

## 2013-05-29 DIAGNOSIS — K9089 Other intestinal malabsorption: Secondary | ICD-10-CM

## 2013-05-29 DIAGNOSIS — D509 Iron deficiency anemia, unspecified: Secondary | ICD-10-CM

## 2013-05-29 DIAGNOSIS — D72819 Decreased white blood cell count, unspecified: Secondary | ICD-10-CM

## 2013-05-29 NOTE — Progress Notes (Signed)
OFFICE PROGRESS NOTE  Interval history:  Belinda Harper is a 73 year old woman with a chronic normochromic anemia, multifactorial, with an element of iron malabsorption. She receives periodic IV iron infusions. She last received IV iron on 05/30/2012. Hemoglobin prior to the iron infusion was 9.7. Hemoglobin improved to a maximum value of 11 on 07/18/2012. Most recent hemoglobin value on 05/15/2012 was 9 compared to 9.1 on 02/20/2013. Ferritin increased from a value of 30 prior to the IV iron to 158 about 6 weeks later and has remained stable in the mid to upper 20 range since.  She notes that she tires easily. She however remains very active and exercises on a regular basis. She denies chest pain. She has some shortness of breath with exercise activities. She occasionally notes hemorrhoid related bleeding.   Objective: Filed Vitals:   05/29/13 1500  BP: 132/69  Pulse: 72  Temp: 97.4 F (36.3 C)  Resp: 18   Oropharynx is without thrush or ulceration. No palpable cervical, supraclavicular or axillary lymph nodes. Lungs are clear. No wheezes or rales. Regular cardiac rhythm. 2/6 systolic murmur. Abdomen soft and nontender. No organomegaly. No leg edema. Calves soft and nontender.   Lab Results: Lab Results  Component Value Date   WBC 4.2 05/15/2013   HGB 9.0* 05/15/2013   HCT 28.5* 05/15/2013   MCV 88.2 05/15/2013   PLT 238 05/15/2013   NEUTROABS 2.6 05/15/2013    Chemistry:    Chemistry      Component Value Date/Time   NA 141 08/06/2012 1041   NA 142 05/28/2012 1600   K 4.6 08/06/2012 1041   K 3.7 05/28/2012 1600   CL 104 08/06/2012 1041   CL 103 05/28/2012 1600   CO2 30 08/06/2012 1041   CO2 29 05/28/2012 1600   BUN 25* 08/06/2012 1041   BUN 19.3 05/28/2012 1600   CREATININE 0.7 08/06/2012 1041   CREATININE 0.8 05/28/2012 1600      Component Value Date/Time   CALCIUM 9.4 08/06/2012 1041   CALCIUM 9.5 05/28/2012 1600   CALCIUM 9.5 08/03/2011 1210   ALKPHOS 64 08/06/2012 1041   ALKPHOS 82 05/28/2012  1600   AST 24 08/06/2012 1041   AST 23 05/28/2012 1600   ALT 15 08/06/2012 1041   ALT 13 05/28/2012 1600   BILITOT 0.2* 08/06/2012 1041   BILITOT <0.20 05/28/2012 1600       Studies/Results: No results found.  Medications: I have reviewed the patient's current medications.  Assessment/Plan: 1. 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   Dispositon-hemoglobin and ferritin remain stable as compared to approximately 3 months ago. We will continue labs on a 3 month schedule with iron infusions as needed.  She was informed at today's visit that Dr. Beryle Beams is leaving this practice. Her care will be reassigned to Dr. Alvy Bimler. A followup visit will be scheduled in 6 months. Signs and symptoms suggestive of progressive anemia were reviewed with her at today's visit. She understands to contact the office should she develop these.   Ned Card ANP/GNP-BC

## 2013-05-29 NOTE — Telephone Encounter (Signed)
gv adn printed appt scehd and avs forpt for april, July aug adn OCT

## 2013-06-22 ENCOUNTER — Encounter: Payer: Self-pay | Admitting: Oncology

## 2013-08-21 ENCOUNTER — Other Ambulatory Visit: Payer: Medicare Other

## 2013-08-22 ENCOUNTER — Other Ambulatory Visit (HOSPITAL_BASED_OUTPATIENT_CLINIC_OR_DEPARTMENT_OTHER): Payer: Medicare Other

## 2013-08-22 DIAGNOSIS — D509 Iron deficiency anemia, unspecified: Secondary | ICD-10-CM

## 2013-08-22 LAB — CBC WITH DIFFERENTIAL/PLATELET
BASO%: 1 % (ref 0.0–2.0)
Basophils Absolute: 0 10*3/uL (ref 0.0–0.1)
EOS%: 4.8 % (ref 0.0–7.0)
Eosinophils Absolute: 0.2 10*3/uL (ref 0.0–0.5)
HCT: 36.8 % (ref 34.8–46.6)
HGB: 11.5 g/dL — ABNORMAL LOW (ref 11.6–15.9)
LYMPH%: 27.4 % (ref 14.0–49.7)
MCH: 27 pg (ref 25.1–34.0)
MCHC: 31.3 g/dL — ABNORMAL LOW (ref 31.5–36.0)
MCV: 86.1 fL (ref 79.5–101.0)
MONO#: 0.3 10*3/uL (ref 0.1–0.9)
MONO%: 8.2 % (ref 0.0–14.0)
NEUT#: 2.2 10*3/uL (ref 1.5–6.5)
NEUT%: 58.6 % (ref 38.4–76.8)
Platelets: 191 10*3/uL (ref 145–400)
RBC: 4.28 10*6/uL (ref 3.70–5.45)
RDW: 14.7 % — ABNORMAL HIGH (ref 11.2–14.5)
WBC: 3.8 10*3/uL — ABNORMAL LOW (ref 3.9–10.3)
lymph#: 1 10*3/uL (ref 0.9–3.3)

## 2013-08-23 ENCOUNTER — Telehealth: Payer: Self-pay | Admitting: *Deleted

## 2013-08-23 LAB — IRON AND TIBC CHCC
%SAT: 13 % — ABNORMAL LOW (ref 21–57)
Iron: 38 ug/dL — ABNORMAL LOW (ref 41–142)
TIBC: 290 ug/dL (ref 236–444)
UIBC: 252 ug/dL (ref 120–384)

## 2013-08-23 LAB — FERRITIN CHCC: Ferritin: 40 ng/ml (ref 9–269)

## 2013-08-23 NOTE — Telephone Encounter (Signed)
Called pt no answer left message.

## 2013-08-23 NOTE — Telephone Encounter (Signed)
Message copied by Ebbie Latus on Fri Aug 23, 2013  3:59 PM ------      Message from: Annia Belt      Created: Fri Aug 23, 2013 12:58 PM       Call pt  Hemoglobin better than baselineat 11.5 iron studies good ------

## 2013-08-26 NOTE — Telephone Encounter (Signed)
Pt notified that Hemoglobin better than baseline at 11.5 and Iron studies good, per Dr. Beryle Beams.  Pt states she is pleased with the results and had tried to call back Oyens who was out today. I confirmed I was following up the message that Holley Raring had left last Friday. Pt had no further questions. Yvonna Alanis, RN, 08/26/13, 7:19 PM

## 2013-09-03 LAB — HM MAMMOGRAPHY

## 2013-09-06 ENCOUNTER — Encounter: Payer: Self-pay | Admitting: Internal Medicine

## 2013-09-20 ENCOUNTER — Encounter: Payer: Self-pay | Admitting: Internal Medicine

## 2013-11-13 ENCOUNTER — Other Ambulatory Visit: Payer: Medicare Other

## 2013-11-14 ENCOUNTER — Telehealth: Payer: Self-pay | Admitting: Hematology and Oncology

## 2013-11-14 NOTE — Telephone Encounter (Signed)
pt called to r/s appt..done...pt aware of new d.t °

## 2013-11-15 ENCOUNTER — Other Ambulatory Visit (HOSPITAL_BASED_OUTPATIENT_CLINIC_OR_DEPARTMENT_OTHER): Payer: Medicare Other

## 2013-11-15 DIAGNOSIS — D509 Iron deficiency anemia, unspecified: Secondary | ICD-10-CM

## 2013-11-15 LAB — CBC WITH DIFFERENTIAL/PLATELET
BASO%: 1 % (ref 0.0–2.0)
Basophils Absolute: 0 10*3/uL (ref 0.0–0.1)
EOS%: 4.4 % (ref 0.0–7.0)
Eosinophils Absolute: 0.1 10*3/uL (ref 0.0–0.5)
HCT: 34.1 % — ABNORMAL LOW (ref 34.8–46.6)
HGB: 10.7 g/dL — ABNORMAL LOW (ref 11.6–15.9)
LYMPH%: 35.1 % (ref 14.0–49.7)
MCH: 27.4 pg (ref 25.1–34.0)
MCHC: 31.3 g/dL — ABNORMAL LOW (ref 31.5–36.0)
MCV: 87.3 fL (ref 79.5–101.0)
MONO#: 0.3 10*3/uL (ref 0.1–0.9)
MONO%: 10.1 % (ref 0.0–14.0)
NEUT#: 1.3 10*3/uL — ABNORMAL LOW (ref 1.5–6.5)
NEUT%: 49.4 % (ref 38.4–76.8)
Platelets: 209 10*3/uL (ref 145–400)
RBC: 3.91 10*6/uL (ref 3.70–5.45)
RDW: 17 % — ABNORMAL HIGH (ref 11.2–14.5)
WBC: 2.6 10*3/uL — ABNORMAL LOW (ref 3.9–10.3)
lymph#: 0.9 10*3/uL (ref 0.9–3.3)

## 2013-11-15 LAB — IRON AND TIBC CHCC
%SAT: 23 % (ref 21–57)
Iron: 69 ug/dL (ref 41–142)
TIBC: 299 ug/dL (ref 236–444)
UIBC: 230 ug/dL (ref 120–384)

## 2013-11-15 LAB — FERRITIN CHCC: Ferritin: 35 ng/ml (ref 9–269)

## 2013-11-19 ENCOUNTER — Telehealth: Payer: Self-pay | Admitting: Hematology and Oncology

## 2013-11-19 NOTE — Telephone Encounter (Signed)
moved 8/12 appt to 12;15pm due to call - s.w pt she is aware.

## 2013-12-04 ENCOUNTER — Ambulatory Visit (HOSPITAL_BASED_OUTPATIENT_CLINIC_OR_DEPARTMENT_OTHER): Payer: Medicare Other | Admitting: Hematology and Oncology

## 2013-12-04 ENCOUNTER — Encounter: Payer: Self-pay | Admitting: Hematology and Oncology

## 2013-12-04 VITALS — BP 148/41 | HR 60 | Temp 98.6°F | Resp 18 | Ht 68.0 in | Wt 152.4 lb

## 2013-12-04 DIAGNOSIS — D509 Iron deficiency anemia, unspecified: Secondary | ICD-10-CM

## 2013-12-04 DIAGNOSIS — D649 Anemia, unspecified: Secondary | ICD-10-CM

## 2013-12-04 DIAGNOSIS — K909 Intestinal malabsorption, unspecified: Secondary | ICD-10-CM

## 2013-12-04 DIAGNOSIS — D72819 Decreased white blood cell count, unspecified: Secondary | ICD-10-CM

## 2013-12-04 NOTE — Assessment & Plan Note (Signed)
This is multifactorial, likely anemia of chronic disease and mild chronic hemorrhoidal bleeding. She is tolerating oral iron supplement well. Continue the same. She is not symptomatic.

## 2013-12-04 NOTE — Assessment & Plan Note (Signed)
This is chronic for almost 10 years. She is not symptomatic. Recommend observation only.

## 2013-12-04 NOTE — Progress Notes (Signed)
[facility] progress notes  Patient Care Team: Burnis Medin, MD as PCP - General Marvene Staff, MD (Obstetrics and Gynecology) Inda Castle, MD (Gastroenterology) Clent Jacks, MD (Ophthalmology) Illene Regulus, DDS (Dentistry) Simona Huh, MD as Consulting Physician (Dermatology) [name], MD as Consulting Physician (Hematology and Oncology)  CHIEF COMPLAINTS/PURPOSE OF VISIT:  Chronic leukopenia and anemia  HISTORY OF PRESENTING ILLNESS:  Belinda Harper 73 y.o. female was transferred to my care after her prior physician has left.  I reviewed the patient's records extensive and collaborated the history with the patient. Summary of her history is as follows: This is a pleasant woman with multifactorial anemia and associated leukopenia. 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 The chronic anemia and leukopenia go back as far as we have records (2007) when white counts ran as low as 2700. She has a normal differential. White count average is 3000 and has not changed. Platelet count is normal. Best hemoglobin she achieves with parenteral iron replacement is 11 g. She had a nondiagnostic bone marrow biopsy done in 2006. She had received intravenous iron infusion in the past. Her last colonoscopy in May 2013 showed severe diverticulosis with internal hemorrhoids. She feels well. Denies recent infection. She denies symptoms of fatigue. She tolerated iron supplement well. She had periodic hemorrhoidal bleeding. She denies constipation. She is taking regular stool softener.  MEDICAL HISTORY:  Past Medical History  Diagnosis Date  . Unspecified vitamin D deficiency 04/06/2007  . HYPERCHOLESTEROLEMIA 05/19/2008  . 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 HEMORRHOIDS 05/19/2008  . ALLERGIC RHINITIS 01/03/2007  .  ASTHMA 01/03/2007  . UTERINE PROLAPSE 04/06/2007  . OSTEOARTHRITIS 01/03/2007  . OSTEOPOROSIS 01/03/2007  . FRACTURE, ANKLE, LEFT 04/10/2009  . Blood transfusion 12 2010    hg 4 transfused 4 units  . Gastritis 2002  . DEPRESSION 01/03/2007    resolved  . Normal cardiac stress test     2014    SURGICAL HISTORY: Past Surgical History  Procedure Laterality Date  . Panendoscopy      small bowel bx  . Ankle fracture surgery  02/2009    left ankle fracture and dislocation  . Cataract extraction w/ intraocular lens implant  2000    right eye  . Orif finger / thumb fracture  1970    reattached  . 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 Marital Status: Divorced    Spouse Name: N/A    Number of Children: 2  . Years of Education: N/A   Occupational History  . Not on file.   Social History Main Topics  . Smoking status: Never Smoker   . Smokeless tobacco: Never Used  . Alcohol Use: Yes     Comment: rare  . Drug Use: No  . Sexual Activity: Not on file   Other Topics Concern  . Not on file   Social History Narrative   HH of  2  Her and granddaughter    Retired Dispensing optician   Never smoked    Pet cat allergic    Divorced   Regular exericise  walking mowing the lawn  FAMILY HISTORY: Family History  Problem Relation Age of Onset  . Hyperlipidemia Mother   . Heart disease Mother     CABG age 74  . Ovarian cancer Mother   . Uterine cancer Mother   . Arthritis Mother   . Osteoporosis Mother   . Heart disease Father     CABG age 54  . Arthritis Father   . Melanoma Father     ALLERGIES:  is allergic to other; sulfa antibiotics; and sulfamethoxazole.  MEDICATIONS:  Current Outpatient Prescriptions  Medication Sig Dispense Refill   . Calcium Citrate-Vitamin D (CITRACAL + D PO) Take 250 mg by mouth 2 (two) times daily. Has 250IU of vitamin D3 and 31m of magnesium      . docusate sodium (COLACE) 100 MG capsule Take 100 mg by mouth daily.      .Marland KitchenEPINEPHrine (EPIPEN) 0.3 mg/0.3 mL DEVI Inject 0.3 mLs (0.3 mg total) into the muscle as needed.  1 Device  0  . FERROUS GLUCONATE PO Take 81 mg by mouth 2 (two) times daily.      . Glucos-Chondroit-Hyaluron-MSM (GLUCOSAMINE CHONDROITIN JOINT) TABS Take by mouth daily.      . Multiple Vitamins-Minerals (ONE-A-DAY WOMENS VITACRAVES PO) Take 1 tablet by mouth daily.      . vitamin C (ASCORBIC ACID) 500 MG tablet Take 500 mg by mouth 2 (two) times daily.       No current facility-administered medications for this visit.   Facility-Administered Medications Ordered in Other Visits  Medication Dose Route Frequency Provider Last Rate Last Dose  . 0.9 %  sodium chloride infusion   Intravenous Once JVivien Rota NP        REVIEW OF SYSTEMS:   Constitutional: Denies fevers, chills or abnormal night sweats Eyes: Denies blurriness of vision, double vision or watery eyes Ears, nose, mouth, throat, and face: Denies mucositis or sore throat Respiratory: Denies cough, dyspnea or wheezes Cardiovascular: Denies palpitation, chest discomfort or lower extremity swelling Gastrointestinal:  Denies nausea, heartburn or change in bowel habits Skin: Denies abnormal skin rashes Lymphatics: Denies new lymphadenopathy or easy bruising Neurological:Denies numbness, tingling or new weaknesses Behavioral/Psych: Mood is stable, no new changes  All other systems were reviewed with the patient and are negative.  PHYSICAL EXAMINATION: ECOG PERFORMANCE STATUS: 0 - Asymptomatic  Filed Vitals:   12/04/13 1148  BP: 148/41  Pulse: 60  Temp: 98.6 F (37 C)  Resp: 18   Filed Weights   12/04/13 1148  Weight: 152 lb 6.4 oz (69.128 kg)    GENERAL:alert, no distress and comfortable SKIN: skin  color, texture, turgor are normal, no rashes or significant lesions EYES: normal, conjunctiva are pink and non-injected, sclera clear PSYCH: alert & oriented x 3 with fluent speech NEURO: no focal motor/sensory deficits  LABORATORY DATA:  I have reviewed the data as listed Lab Results  Component Value Date   WBC 2.6* 11/15/2013   HGB 10.7* 11/15/2013   HCT 34.1* 11/15/2013   MCV 87.3 11/15/2013   PLT 209 11/15/2013   No results found for this basename: NA, K, CL, CO2, GLUCOSE, BUN, CREATININE, CALCIUM, GFRNONAA, GFRAA, PROT, ALBUMIN, AST, ALT, ALKPHOS, BILITOT, BILIDIR, IBILI,  in the last 8760 hours  ASSESSMENT & PLAN:  Leukopenia This is chronic for almost 10 years. She is not symptomatic. Recommend observation only.  ANEMIA-IRON DEFICIENCY This is multifactorial, likely anemia of chronic disease and mild chronic hemorrhoidal bleeding. She is tolerating oral iron supplement well. Continue the same. She  is not symptomatic.    Orders Placed This Encounter  Procedures  . CBC & Diff and Retic    Standing Status: Future     Number of Occurrences:      Standing Expiration Date: 01/08/2015  . Morphology    Standing Status: Future     Number of Occurrences:      Standing Expiration Date: 01/08/2015  . Ferritin    Standing Status: Future     Number of Occurrences:      Standing Expiration Date: 01/08/2015  . Iron and TIBC    Standing Status: Future     Number of Occurrences:      Standing Expiration Date: 01/08/2015    All questions were answered. The patient knows to call the clinic with any problems, questions or concerns. I spent 15 minutes counseling the patient face to face. The total time spent in the appointment was 20 minutes and more than 50% was on counseling.     [facility], [place], MD 12/04/2013 12:14 PM

## 2013-12-06 ENCOUNTER — Telehealth: Payer: Self-pay | Admitting: Hematology and Oncology

## 2013-12-06 NOTE — Telephone Encounter (Signed)
lvm for pt regarding to OCT 2015 and Aug 2016 appts....mailed pt appt sched/avs and letter

## 2013-12-11 ENCOUNTER — Other Ambulatory Visit (INDEPENDENT_AMBULATORY_CARE_PROVIDER_SITE_OTHER): Payer: Medicare Other

## 2013-12-11 DIAGNOSIS — E7800 Pure hypercholesterolemia, unspecified: Secondary | ICD-10-CM

## 2013-12-11 DIAGNOSIS — Z0000 Encounter for general adult medical examination without abnormal findings: Secondary | ICD-10-CM

## 2013-12-11 LAB — HEPATIC FUNCTION PANEL
ALT: 14 U/L (ref 0–35)
AST: 26 U/L (ref 0–37)
Albumin: 3.6 g/dL (ref 3.5–5.2)
Alkaline Phosphatase: 63 U/L (ref 39–117)
Bilirubin, Direct: 0 mg/dL (ref 0.0–0.3)
Total Bilirubin: 0.6 mg/dL (ref 0.2–1.2)
Total Protein: 7.1 g/dL (ref 6.0–8.3)

## 2013-12-11 LAB — CBC WITH DIFFERENTIAL/PLATELET
Basophils Absolute: 0 10*3/uL (ref 0.0–0.1)
Basophils Relative: 0.7 % (ref 0.0–3.0)
Eosinophils Absolute: 0.1 10*3/uL (ref 0.0–0.7)
Eosinophils Relative: 4.3 % (ref 0.0–5.0)
HCT: 30.9 % — ABNORMAL LOW (ref 36.0–46.0)
Hemoglobin: 9.8 g/dL — ABNORMAL LOW (ref 12.0–15.0)
Lymphocytes Relative: 32.4 % (ref 12.0–46.0)
Lymphs Abs: 1 10*3/uL (ref 0.7–4.0)
MCHC: 31.8 g/dL (ref 30.0–36.0)
MCV: 87.8 fl (ref 78.0–100.0)
Monocytes Absolute: 0.3 10*3/uL (ref 0.1–1.0)
Monocytes Relative: 9.2 % (ref 3.0–12.0)
Neutro Abs: 1.6 10*3/uL (ref 1.4–7.7)
Neutrophils Relative %: 53.4 % (ref 43.0–77.0)
Platelets: 172 10*3/uL (ref 150.0–400.0)
RBC: 3.52 Mil/uL — ABNORMAL LOW (ref 3.87–5.11)
RDW: 16.2 % — ABNORMAL HIGH (ref 11.5–15.5)
WBC: 3 10*3/uL — ABNORMAL LOW (ref 4.0–10.5)

## 2013-12-11 LAB — BASIC METABOLIC PANEL
BUN: 21 mg/dL (ref 6–23)
CO2: 31 mEq/L (ref 19–32)
Calcium: 8.9 mg/dL (ref 8.4–10.5)
Chloride: 106 mEq/L (ref 96–112)
Creatinine, Ser: 0.7 mg/dL (ref 0.4–1.2)
GFR: 87.46 mL/min (ref >60.00)
Glucose, Bld: 81 mg/dL (ref 70–99)
Potassium: 4 mEq/L (ref 3.5–5.1)
Sodium: 141 mEq/L (ref 135–145)

## 2013-12-11 LAB — LIPID PANEL
Cholesterol: 182 mg/dL (ref 0–200)
HDL: 64.7 mg/dL (ref >39.00)
LDL Cholesterol: 107 mg/dL — ABNORMAL HIGH (ref 0–99)
NonHDL: 117.3
Total CHOL/HDL Ratio: 3
Triglycerides: 54 mg/dL (ref 0.0–149.0)
VLDL: 10.8 mg/dL (ref 0.0–40.0)

## 2013-12-11 LAB — TSH: TSH: 1.35 u[IU]/mL (ref 0.35–4.50)

## 2013-12-18 ENCOUNTER — Ambulatory Visit (INDEPENDENT_AMBULATORY_CARE_PROVIDER_SITE_OTHER): Payer: Medicare Other | Admitting: Internal Medicine

## 2013-12-18 ENCOUNTER — Encounter: Payer: Self-pay | Admitting: Internal Medicine

## 2013-12-18 VITALS — BP 132/68 | Temp 98.9°F | Ht 67.0 in | Wt 155.0 lb

## 2013-12-18 DIAGNOSIS — Z23 Encounter for immunization: Secondary | ICD-10-CM

## 2013-12-18 DIAGNOSIS — D539 Nutritional anemia, unspecified: Secondary | ICD-10-CM

## 2013-12-18 DIAGNOSIS — K648 Other hemorrhoids: Secondary | ICD-10-CM

## 2013-12-18 DIAGNOSIS — M810 Age-related osteoporosis without current pathological fracture: Secondary | ICD-10-CM

## 2013-12-18 DIAGNOSIS — Z0000 Encounter for general adult medical examination without abnormal findings: Secondary | ICD-10-CM | POA: Insufficient documentation

## 2013-12-18 DIAGNOSIS — N819 Female genital prolapse, unspecified: Secondary | ICD-10-CM

## 2013-12-18 DIAGNOSIS — K649 Unspecified hemorrhoids: Secondary | ICD-10-CM

## 2013-12-18 NOTE — Patient Instructions (Addendum)
Continue lifestyle intervention healthy eating and exercise . It appears that you have some pelvic prolapse and a rectocele ? [name] If progressive symptoms would have you see dr Garwin Brothers or gyne  To evaluated.  If the hemorrhoids are recurrent bleeding  Still consider other consultation because of your anemia.  You may want to get a rectal surgeon to check this area for another opinion. But i agree want to be conservative .  You have osteoporosis levels can follow as you wish. Flu vaccine  October is ok . Continue lifestyle intervention healthy eating and exercise . Healthy lifestyle includes : At least 150 minutes of exercise weeks  , weight at healthy levels, which is usually   BMI 19-25. Avoid trans fats and processed foods;  Increase fresh fruits and veges to 5 servings per day. And avoid sweet beverages including tea and juice. Mediterranean diet with olive oil and nuts have been noted to be heart and brain healthy . Avoid tobacco products . Limit  alcohol to  7 per week for women and 14 servings for men.  Get adequate sleep .  Wellness visit  In 1 year  Labs and anemia monitoring as per hematology and here.

## 2013-12-18 NOTE — Progress Notes (Signed)
Pre visit review using our clinic review tool, if applicable. No additional management support is needed unless otherwise documented below in the visit note.  Chief Complaint  Patient presents with  . Medicare Wellness    Will wait on high dose flu vaccine to come in during Sept.    HPI: Patient comes in today for Preventive Medicare wellness visit . No major injuries, ed visits ,hospitalizations , new medications since last visit. Yellow sting  With epi pen.  Still anemic followed no bleeding except for hemorrhoids  fairly frequently like monthly . Uses stool softener  Remote hx of procedure . Stopped doing iron infusion cause "douesnt work" takin iron and vit c  Some fatigue no bruising .   CO: get fullness in pelvis like something dropping   No uti  Comes and goes . No recnet gyne check   Health Maintenance  Topic Date Due  . Influenza Vaccine  11/23/2013  . Mammogram  09/04/2015  . Tetanus/tdap  06/23/2019  . Colonoscopy  08/29/2021  . Pneumococcal Polysaccharide Vaccine Age 65 And Over  Completed  . Zostavax  Completed   Health Maintenance Review LIFESTYLE:  Exercise:  Walking silver sneakers some  Tobacco/ETS:no Alcohol: per day no Sugar beverages:not much Sleep:ok Drug use: no Bone density: -2.7 6 15   Colonoscopy: utd 2013   Hearing: ok sometimes hard to screen out noise  Vision:  No limitations at present . Last eye check UTD  Safety:  Has smoke detector and wears seat belts.  No firearms. No excess sun exposure. Sees dentist regularly.  Falls: no  Advance directive :  Reviewed  Has  Memory: Felt to be good  , no concern from her or her family.  Depression: No anhedonia unusual crying or depressive symptoms  Nutrition: Eats well balanced diet; adequate calcium and vitamin D. No swallowing chewing problems.  Injury: no major injuries in the last six months.  Other healthcare providers:  Reviewed today .  Social:  Lives alone.   Preventive parameters:  up-to-date  Reviewed   ADLS:   There are no problems or need for assistance  driving, feeding, obtaining food, dressing, toileting and bathing, managing money using phone. She is independent.  ROS:  GEN/ HEENT: No fever, significant weight changes sweats headaches vision problems hearing changes, CV/ PULM; No chest pain shortness of breath cough, syncope,edema  change in exercise tolerance. GI /GU: No adominal pain, vomiting, change in bowel habits. No blood in the stool. No significant see above SKIN/HEME: ,no acute skin rashes suspicious lesions or bleeding. No lymphadenopathy, nodules, masses.  NEURO/ PSYCH:  No neurologic signs such as weakness numbness. No depression anxiety. IMM/ Allergy: No unusual infections.  Allergy .   REST of 12 system review negative except as per HPI   Past Medical History  Diagnosis Date  . Unspecified vitamin D deficiency 04/06/2007  . HYPERCHOLESTEROLEMIA 05/19/2008  . 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 HEMORRHOIDS 05/19/2008  . ALLERGIC RHINITIS 01/03/2007  . ASTHMA 01/03/2007  . UTERINE PROLAPSE 04/06/2007  . OSTEOARTHRITIS 01/03/2007  . OSTEOPOROSIS 01/03/2007    hx of actonbel use for at least 5 years   . FRACTURE, ANKLE, LEFT 04/10/2009  . Blood transfusion 12 2010    hg 4 transfused 4 units  . Gastritis 2002  . DEPRESSION 01/03/2007    resolved  . Normal cardiac stress test     2014    Family History  Problem  Relation Age of Onset  . Hyperlipidemia Mother   . Heart disease Mother     CABG age 28  . Ovarian cancer Mother   . Uterine cancer Mother   . Arthritis Mother   . Osteoporosis Mother   . Heart disease Father     CABG age 70  . Arthritis Father   . Melanoma Father     History   Social History  . Marital Status: Divorced    Spouse Name: N/A    Number of Children: 2  . Years of Education: N/A   Social History Main Topics  . Smoking status: Never Smoker   .  Smokeless tobacco: Never Used  . Alcohol Use: Yes     Comment: rare  . Drug Use: No  . Sexual Activity: None   Other Topics Concern  . None   Social History Narrative   HH of  1     Retired Dispensing optician   Never smoked    Pet cat allergic    Divorced   Regular exericise  walking mowing the lawn   Silver sneakers     Outpatient Encounter Prescriptions as of 12/18/2013  Medication Sig  . Calcium Citrate-Vitamin D (CITRACAL + D PO) Take 250 mg by mouth 2 (two) times daily. Has 250IU of vitamin D3 and 80mg  of magnesium  . docusate sodium (COLACE) 100 MG capsule Take 100 mg by mouth daily.  [name] EPINEPHrine (EPIPEN) 0.3 mg/0.3 mL DEVI Inject 0.3 mLs (0.3 mg total) into the muscle as needed.  [name] FERROUS GLUCONATE PO Take 81 mg by mouth 2 (two) times daily.  . Glucos-Chondroit-Hyaluron-MSM (GLUCOSAMINE CHONDROITIN JOINT) TABS Take by mouth daily.  . Multiple Vitamins-Minerals (ONE-A-DAY WOMENS VITACRAVES PO) Take 1 tablet by mouth daily.  . vitamin C (ASCORBIC ACID) 500 MG tablet Take 500 mg by mouth 2 (two) times daily.    EXAM:  BP 132/68  Temp(Src) 98.9 F (37.2 C) (Oral)  Ht 5\' 7"  (1.702 m)  Wt 155 lb (70.308 kg)  BMI 24.27 kg/m2  Body mass index is 24.27 kg/(m^2).  Physical Exam: Vital signs reviewed WUJ:WJXB is a well-developed well-nourished alert cooperative   who appears stated age in no acute distress.  HEENT: normocephalic atraumatic , Eyes: PERRL EOM's full, conjunctiva clear, Nares: paten,t no deformity discharge or tenderness., Ears: no deformity EAC's clear TMs with normal landmarks. Mouth: clear OP, no lesions, edema.  Moist mucous membranes. Dentition in adequate repair. NECK: supple without masses, thyromegaly or bruits. CHEST/PULM:  Clear to auscultation and percussion breath sounds equal no wheeze , rales or rhonchi. No chest wall deformities or tenderness.Breast: normal by inspection . No dimpling, discharge, masses, tenderness or discharge . CV: PMI is  nondisplaced, S1 S2 no gallops, murmurs, rubs. Peripheral pulses are full without delay.No JVD .  ABDOMEN: Bowel sounds normal nontender  No guard or rebound, no hepato splenomegal no CVA tenderness.  No hernia Extremtities:  No clubbing cyanosis or edema, no acute joint swelling or redness no focal atrophy NEURO:  Oriented x3, cranial nerves 3-12 appear to be intact, no obvious focal weakness,gait within normal limits no abnormal reflexes or asymmetrical SKIN: No acute rashes normal turgor, color, no bruising or petechiae. PSYCH: Oriented, good eye contact, no obvious depression anxiety, cognition and judgment appear normal. LN: no cervical axillary inguinal adenopathy No noted deficits in memory, attention, and speech. PELVIC EXT GU  Large hemorrhoids  Dusky no active bleeding.   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  No pain or lesion   Lab Results  Component Value Date   WBC 3.0* 12/11/2013   HGB 9.8* 12/11/2013   HCT 30.9* 12/11/2013   PLT 172.0 12/11/2013   GLUCOSE 81 12/11/2013   CHOL 182 12/11/2013   TRIG 54.0 12/11/2013   HDL 64.70 12/11/2013   LDLDIRECT 141.9 08/06/2012   LDLCALC 107* 12/11/2013   ALT 14 12/11/2013   AST 26 12/11/2013   NA 141 12/11/2013   K 4.0 12/11/2013   CL 106 12/11/2013   CREATININE 0.7 12/11/2013   BUN 21 12/11/2013   CO2 31 12/11/2013   TSH 1.35 12/11/2013   INR 1.04 04/10/2009    ASSESSMENT AND PLAN:  Discussed the following assessment and plan:  Medicare annual wellness visit, initial  Unspecified deficiency anemia - with leukopenia   Osteoporosis, unspecified - -2.7 by dexa has read about med not interested at this time consider restart actonel in futurewill revisit next year  Need for vaccination with 13-polyvalent pneumococcal conjugate vaccine - Plan: Pneumococcal conjugate vaccine 13-valent  Pelvic prolapse - if persistent  or worsenign see gyne for options as discussed  Other hemorrhoids - large  persisistent  intermittent  bleedingprob contirbuting to anemia  Patient Care Team: Burnis Medin, MD as PCP - General Marvene Staff, MD (Obstetrics and Gynecology) Inda Castle, MD (Gastroenterology) Clent Jacks, MD (Ophthalmology) Illene Regulus, DDS (Dentistry) Simona Huh, MD as Consulting Physician (Dermatology) [name], MD as Consulting Physician (Hematology and Oncology)  Patient Instructions  Continue lifestyle intervention healthy eating and exercise . It appears that you have some pelvic prolapse and a rectocele ? [name] If progressive symptoms would have you see dr Garwin Brothers or gyne  To evaluated.  If the hemorrhoids are recurrent bleeding  Still consider other consultation because of your anemia.  You may want to get a rectal surgeon to check this area for another opinion. But i agree want to be conservative .  You have osteoporosis levels can follow as you wish. Flu vaccine  October is ok . Continue lifestyle intervention healthy eating and exercise . Healthy lifestyle includes : At least 150 minutes of exercise weeks  , weight at healthy levels, which is usually   BMI 19-25. Avoid trans fats and processed foods;  Increase fresh fruits and veges to 5 servings per day. And avoid sweet beverages including tea and juice. Mediterranean diet with olive oil and nuts have been noted to be heart and brain healthy . Avoid tobacco products . Limit  alcohol to  7 per week for women and 14 servings for men.  Get adequate sleep .  Wellness visit  In 1 year  Labs and anemia monitoring as per hematology and here.      Standley Brooking. Jamarea Selner M.D.

## 2013-12-19 DIAGNOSIS — N819 Female genital prolapse, unspecified: Secondary | ICD-10-CM | POA: Insufficient documentation

## 2013-12-19 DIAGNOSIS — K648 Other hemorrhoids: Secondary | ICD-10-CM | POA: Insufficient documentation

## 2014-01-23 ENCOUNTER — Telehealth: Payer: Self-pay | Admitting: Internal Medicine

## 2014-01-23 DIAGNOSIS — Z91030 Bee allergy status: Secondary | ICD-10-CM

## 2014-01-23 NOTE — Telephone Encounter (Signed)
[facility], [facility] is requesting re-fill on EPINEPHrine (EPIPEN) 0.3 mg/0.3 mL DEVI

## 2014-01-24 NOTE — Telephone Encounter (Signed)
Ihaven't been prescribing this that i can tell. Contact  patinet and asj about  Prescribing provider ;Use; last use and reason for use .

## 2014-01-24 NOTE — Telephone Encounter (Signed)
Will send to [facility] to see if she is filling.

## 2014-01-24 NOTE — Telephone Encounter (Signed)
Spoke to the pt. She said this was originally prescribed by the hospital.  Seen in the ED for a yellow jacket sting.  Now expired.  Please advise.  Thanks!

## 2014-01-24 NOTE — Telephone Encounter (Signed)
If she is allergic to stings to the point where she needs  And epi pen  With systemic reacition she should  Be evaluated by  Allergist  Please advise referral to allergist  Can rx 1 epi pen in theinterim if needed

## 2014-01-27 MED ORDER — EPINEPHRINE 0.3 MG/0.3ML IJ SOAJ
0.3000 mg | Freq: Once | INTRAMUSCULAR | Status: DC
Start: 2014-01-27 — End: 2016-11-03

## 2014-01-27 NOTE — Telephone Encounter (Signed)
LMOM for the pt to return my call.

## 2014-01-27 NOTE — Telephone Encounter (Signed)
Pt is aware and ok with the referral to Allergist.  She is waiting on the re-fill and states if still need to talk to her, give her a callback.

## 2014-01-27 NOTE — Telephone Encounter (Signed)
Order placed to the allergist and refill of epi pen sent to the pharmacy by e-scribe.

## 2014-02-04 ENCOUNTER — Telehealth: Payer: Self-pay | Admitting: Hematology and Oncology

## 2014-02-04 NOTE — Telephone Encounter (Signed)
returned pt call and confirmed that i cx 10.14.Belinda KitchenMarland KitchenMarland Harper

## 2014-02-05 ENCOUNTER — Ambulatory Visit: Payer: Medicare Other | Admitting: Hematology and Oncology

## 2014-02-05 ENCOUNTER — Other Ambulatory Visit: Payer: Medicare Other

## 2014-05-20 ENCOUNTER — Encounter: Payer: Self-pay | Admitting: Internal Medicine

## 2014-05-20 ENCOUNTER — Ambulatory Visit (INDEPENDENT_AMBULATORY_CARE_PROVIDER_SITE_OTHER): Payer: Medicare Other | Admitting: Internal Medicine

## 2014-05-20 VITALS — BP 150/90 | HR 66 | Temp 98.1°F | Resp 18 | Ht 67.0 in | Wt 156.0 lb

## 2014-05-20 DIAGNOSIS — S098XXA Other specified injuries of head, initial encounter: Secondary | ICD-10-CM

## 2014-05-20 DIAGNOSIS — M2170 Unequal limb length (acquired), unspecified site: Secondary | ICD-10-CM

## 2014-05-20 DIAGNOSIS — R269 Unspecified abnormalities of gait and mobility: Secondary | ICD-10-CM

## 2014-05-20 NOTE — Patient Instructions (Signed)
Call or return to clinic prn if these symptoms worsen or fail to improve as anticipated.  Concussion A concussion, or closed-head injury, is a brain injury caused by a direct blow to the head or by a quick and sudden movement (jolt) of the head or neck. Concussions are usually not life-threatening. Even so, the effects of a concussion can be serious. If you have had a concussion before, you are more likely to experience concussion-like symptoms after a direct blow to the head.  CAUSES  Direct blow to the head, such as from running into another player during a soccer game, being hit in a fight, or hitting your head on a hard surface.  A jolt of the head or neck that causes the brain to move back and forth inside the skull, such as in a car crash. SIGNS AND SYMPTOMS The signs of a concussion can be hard to notice. Early on, they may be missed by you, family members, and health care providers. You may look fine but act or feel differently. Symptoms are usually temporary, but they may last for days, weeks, or even longer. Some symptoms may appear right away while others may not show up for hours or days. Every head injury is different. Symptoms include:  Mild to moderate headaches that will not go away.  A feeling of pressure inside your head.  Having more trouble than usual:  Learning or remembering things you have heard.  Answering questions.  Paying attention or concentrating.  Organizing daily tasks.  Making decisions and solving problems.  Slowness in thinking, acting or reacting, speaking, or reading.  Getting lost or being easily confused.  Feeling tired all the time or lacking energy (fatigued).  Feeling drowsy.  Sleep disturbances.  Sleeping more than usual.  Sleeping less than usual.  Trouble falling asleep.  Trouble sleeping (insomnia).  Loss of balance or feeling lightheaded or dizzy.  Nausea or vomiting.  Numbness or tingling.  Increased sensitivity  to:  Sounds.  Lights.  Distractions.  Vision problems or eyes that tire easily.  Diminished sense of taste or smell.  Ringing in the ears.  Mood changes such as feeling sad or anxious.  Becoming easily irritated or angry for little or no reason.  Lack of motivation.  Seeing or hearing things other people do not see or hear (hallucinations). DIAGNOSIS Your health care provider can usually diagnose a concussion based on a description of your injury and symptoms. He or she will ask whether you passed out (lost consciousness) and whether you are having trouble remembering events that happened right before and during your injury. Your evaluation might include:  A brain scan to look for signs of injury to the brain. Even if the test shows no injury, you may still have a concussion.  Blood tests to be sure other problems are not present. TREATMENT  Concussions are usually treated in an emergency department, in urgent care, or at a clinic. You may need to stay in the hospital overnight for further treatment.  Tell your health care provider if you are taking any medicines, including prescription medicines, over-the-counter medicines, and natural remedies. Some medicines, such as blood thinners (anticoagulants) and aspirin, may increase the chance of complications. Also tell your health care provider whether you have had alcohol or are taking illegal drugs. This information may affect treatment.  Your health care provider will send you home with important instructions to follow.  How fast you will recover from a concussion depends on many  factors. These factors include how severe your concussion is, what part of your brain was injured, your age, and how healthy you were before the concussion.  Most people with mild injuries recover fully. Recovery can take time. In general, recovery is slower in older persons. Also, persons who have had a concussion in the past or have other medical  problems may find that it takes longer to recover from their current injury. HOME CARE INSTRUCTIONS General Instructions  Carefully follow the directions your health care provider gave you.  Only take over-the-counter or prescription medicines for pain, discomfort, or fever as directed by your health care provider.  Take only those medicines that your health care provider has approved.  Do not drink alcohol until your health care provider says you are well enough to do so. Alcohol and certain other drugs may slow your recovery and can put you at risk of further injury.  If it is harder than usual to remember things, write them down.  If you are easily distracted, try to do one thing at a time. For example, do not try to watch TV while fixing dinner.  Talk with family members or close friends when making important decisions.  Keep all follow-up appointments. Repeated evaluation of your symptoms is recommended for your recovery.  Watch your symptoms and tell others to do the same. Complications sometimes occur after a concussion. Older adults with a brain injury may have a higher risk of serious complications, such as a blood clot on the brain.  Tell your teachers, school nurse, school counselor, coach, athletic trainer, or work Freight forwarder about your injury, symptoms, and restrictions. Tell them about what you can or cannot do. They should watch for:  Increased problems with attention or concentration.  Increased difficulty remembering or learning new information.  Increased time needed to complete tasks or assignments.  Increased irritability or decreased ability to cope with stress.  Increased symptoms.  Rest. Rest helps the brain to heal. Make sure you:  Get plenty of sleep at night. Avoid staying up late at night.  Keep the same bedtime hours on weekends and weekdays.  Rest during the day. Take daytime naps or rest breaks when you feel tired.  Limit activities that require a  lot of thought or concentration. These include:  Doing homework or job-related work.  Watching TV.  Working on the computer.  Avoid any situation where there is potential for another head injury (football, hockey, soccer, basketball, martial arts, downhill snow sports and horseback riding). Your condition will get worse every time you experience a concussion. You should avoid these activities until you are evaluated by the appropriate follow-up health care providers. Returning To Your Regular Activities You will need to return to your normal activities slowly, not all at once. You must give your body and brain enough time for recovery.  Do not return to sports or other athletic activities until your health care provider tells you it is safe to do so.  Ask your health care provider when you can drive, ride a bicycle, or operate heavy machinery. Your ability to react may be slower after a brain injury. Never do these activities if you are dizzy.  Ask your health care provider about when you can return to work or school. Preventing Another Concussion It is very important to avoid another brain injury, especially before you have recovered. In rare cases, another injury can lead to permanent brain damage, brain swelling, or death. The risk of this is  greatest during the first 7-10 days after a head injury. Avoid injuries by:  Wearing a seat belt when riding in a car.  Drinking alcohol only in moderation.  Wearing a helmet when biking, skiing, skateboarding, skating, or doing similar activities.  Avoiding activities that could lead to a second concussion, such as contact or recreational sports, until your health care provider says it is okay.  Taking safety measures in your home.  Remove clutter and tripping hazards from floors and stairways.  Use grab bars in bathrooms and handrails by stairs.  Place non-slip mats on floors and in bathtubs.  Improve lighting in dim areas. SEEK MEDICAL  CARE IF:  You have increased problems paying attention or concentrating.  You have increased difficulty remembering or learning new information.  You need more time to complete tasks or assignments than before.  You have increased irritability or decreased ability to cope with stress.  You have more symptoms than before. Seek medical care if you have any of the following symptoms for more than 2 weeks after your injury:  Lasting (chronic) headaches.  Dizziness or balance problems.  Nausea.  Vision problems.  Increased sensitivity to noise or light.  Depression or mood swings.  Anxiety or irritability.  Memory problems.  Difficulty concentrating or paying attention.  Sleep problems.  Feeling tired all the time. SEEK IMMEDIATE MEDICAL CARE IF:  You have severe or worsening headaches. These may be a sign of a blood clot in the brain.  You have weakness (even if only in one hand, leg, or part of the face).  You have numbness.  You have decreased coordination.  You vomit repeatedly.  You have increased sleepiness.  One pupil is larger than the other.  You have convulsions.  You have slurred speech.  You have increased confusion. This may be a sign of a blood clot in the brain.  You have increased restlessness, agitation, or irritability.  You are unable to recognize people or places.  You have neck pain.  It is difficult to wake you up.  You have unusual behavior changes.  You lose consciousness. MAKE SURE YOU:  Understand these instructions.  Will watch your condition.  Will get help right away if you are not doing well or get worse. Document Released: 07/02/2003 Document Revised: 04/16/2013 Document Reviewed: 11/01/2012 [facility] Patient Information 2015 [place], [place]. This information is not intended to replace advice given to you by your health care provider. Make sure you discuss any questions you have with your health care provider.

## 2014-05-20 NOTE — Progress Notes (Signed)
Subjective:    Patient ID: Belinda Harper, female    DOB: [birth year], 74 y.o.   MRN: [id number]  HPI  74 year old patient who slipped on some black ice earlier today.  She fell backward sustaining trauma to the occipital scalp area.  There was no loss of consciousness, confusion, or other complaints.  At the present time, she has pain in the occipital scalp area as well as the right neck region.   Denies any headache, confusion, vertigo or other complaints.  No visual disturbances  Past Medical History  Diagnosis Date  . Unspecified vitamin D deficiency 04/06/2007  . HYPERCHOLESTEROLEMIA 05/19/2008  . 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 HEMORRHOIDS 05/19/2008  . ALLERGIC RHINITIS 01/03/2007  . ASTHMA 01/03/2007  . UTERINE PROLAPSE 04/06/2007  . OSTEOARTHRITIS 01/03/2007  . OSTEOPOROSIS 01/03/2007    hx of actonbel use for at least 5 years   . FRACTURE, ANKLE, LEFT 04/10/2009  . Blood transfusion 12 2010    hg 4 transfused 4 units  . Gastritis 2002  . DEPRESSION 01/03/2007    resolved  . Normal cardiac stress test     2014    History   Social History  . Marital Status: Divorced    Spouse Name: N/A    Number of Children: 2  . Years of Education: N/A   Occupational History  . Not on file.   Social History Main Topics  . Smoking status: Never Smoker   . Smokeless tobacco: Never Used  . Alcohol Use: Yes     Comment: rare  . Drug Use: No  . Sexual Activity: Not on file   Other Topics Concern  . Not on file   Social History Narrative   HH of  1     Retired Dispensing optician   Never smoked    Pet cat allergic    Divorced   Regular exericise  walking mowing the lawn   Silver sneakers     Past Surgical History  Procedure Laterality Date  . Panendoscopy      small bowel bx  . Ankle fracture surgery  02/2009    left ankle fracture and dislocation  . Cataract extraction w/ intraocular lens implant  2000   right eye  . Orif finger / thumb fracture  1970    reattached  . 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 Hyperlipidemia Mother   . Heart disease Mother     CABG age 36  . Ovarian cancer Mother   . Uterine cancer Mother   . Arthritis Mother   . Osteoporosis Mother   . Heart disease Father     CABG age 35  . Arthritis Father   . Melanoma Father     Allergies  Allergen Reactions  . Other     Bee sting  . Sulfa Antibiotics   . Sulfamethoxazole     REACTION: unspecified    Current Outpatient Prescriptions on File Prior to Visit  Medication Sig Dispense Refill  . Calcium Citrate-Vitamin D (CITRACAL + D PO) Take 250 mg by mouth 2 (two) times daily. Has 250IU of vitamin D3 and 80mg  of magnesium    . docusate sodium (COLACE) 100 MG  capsule Take 100 mg by mouth daily.    [name] EPINEPHrine 0.3 mg/0.3 mL IJ SOAJ injection Inject 0.3 mLs (0.3 mg total) into the muscle once. 1 Device 0  . FERROUS GLUCONATE PO Take 81 mg by mouth 2 (two) times daily.    . Glucos-Chondroit-Hyaluron-MSM (GLUCOSAMINE CHONDROITIN JOINT) TABS Take by mouth daily.    . Multiple Vitamins-Minerals (ONE-A-DAY WOMENS VITACRAVES PO) Take 1 tablet by mouth daily.    . vitamin C (ASCORBIC ACID) 500 MG tablet Take 500 mg by mouth 2 (two) times daily.     Current Facility-Administered Medications on File Prior to Visit  Medication Dose Route Frequency Provider Last Rate Last Dose  . 0.9 %  sodium chloride infusion   Intravenous Once Vivien Rota, NP        BP 150/90 mmHg  Pulse 66  Temp(Src) 98.1 F (36.7 C) (Oral)  Resp 18  Ht 5\' 7"  (1.702 m)  Wt 156 lb (70.761 kg)  BMI 24.43 kg/m2  SpO2 98%   Review of Systems  Constitutional: Negative.   HENT: Negative for  congestion, dental problem, hearing loss, rhinorrhea, sinus pressure, sore throat and tinnitus.   Eyes: Negative for pain, discharge and visual disturbance.  Respiratory: Negative for cough and shortness of breath.   Cardiovascular: Negative for chest pain, palpitations and leg swelling.  Gastrointestinal: Negative for nausea, vomiting, abdominal pain, diarrhea, constipation, blood in stool and abdominal distention.  Genitourinary: Negative for dysuria, urgency, frequency, hematuria, flank pain, vaginal bleeding, vaginal discharge, difficulty urinating, vaginal pain and pelvic pain.  Musculoskeletal: Negative for joint swelling, arthralgias and gait problem.  Skin: Negative for rash.  Neurological: Positive for headaches (right occipital scalp tenderness related to fall). Negative for dizziness, syncope, speech difficulty, weakness and numbness.  Hematological: Negative for adenopathy.  Psychiatric/Behavioral: Negative for behavioral problems, dysphoric mood and agitation. The patient is not nervous/anxious.        Objective:   Physical Exam  Constitutional: She is oriented to person, place, and time. She appears well-developed and well-nourished. No distress.  HENT:  Small hematoma right occipital scalp area  Eyes: Conjunctivae and EOM are normal. Pupils are equal, round, and reactive to light.  Neck: Normal range of motion. Neck supple.  Slight tenderness right anterior neck region  Neurological: She is alert and oriented to person, place, and time. She has normal reflexes. No cranial nerve deficit. Coordination normal.          Assessment & Plan:   Traumatic occipital hematoma.  Doubt major concussive syndrome.  Will observe.  Will continue to apply ice for the next 24 hours.  Concussion protocol discussed.

## 2014-05-20 NOTE — Progress Notes (Signed)
Pre visit review using our clinic review tool, if applicable. No additional management support is needed unless otherwise documented below in the visit note. 

## 2014-07-16 ENCOUNTER — Other Ambulatory Visit: Payer: Self-pay | Admitting: Nurse Practitioner

## 2014-09-24 ENCOUNTER — Other Ambulatory Visit (INDEPENDENT_AMBULATORY_CARE_PROVIDER_SITE_OTHER): Payer: Medicare Other

## 2014-09-24 DIAGNOSIS — E780 Pure hypercholesterolemia: Secondary | ICD-10-CM | POA: Diagnosis not present

## 2014-09-24 DIAGNOSIS — Z0000 Encounter for general adult medical examination without abnormal findings: Secondary | ICD-10-CM | POA: Diagnosis not present

## 2014-09-24 DIAGNOSIS — E559 Vitamin D deficiency, unspecified: Secondary | ICD-10-CM

## 2014-09-24 LAB — CBC WITH DIFFERENTIAL/PLATELET
Basophils Absolute: 0 10*3/uL (ref 0.0–0.1)
Basophils Relative: 0.5 % (ref 0.0–3.0)
Eosinophils Absolute: 0 10*3/uL (ref 0.0–0.7)
Eosinophils Relative: 0.9 % (ref 0.0–5.0)
HCT: 36.4 % (ref 36.0–46.0)
Hemoglobin: 11.8 g/dL — ABNORMAL LOW (ref 12.0–15.0)
Lymphocytes Relative: 54.8 % — ABNORMAL HIGH (ref 12.0–46.0)
Lymphs Abs: 1.6 10*3/uL (ref 0.7–4.0)
MCHC: 32.4 g/dL (ref 30.0–36.0)
MCV: 85.9 fl (ref 78.0–100.0)
Monocytes Absolute: 0.3 10*3/uL (ref 0.1–1.0)
Monocytes Relative: 11.9 % (ref 3.0–12.0)
Neutro Abs: 0.9 10*3/uL — ABNORMAL LOW (ref 1.4–7.7)
Neutrophils Relative %: 31.9 % — ABNORMAL LOW (ref 43.0–77.0)
Platelets: 124 10*3/uL — ABNORMAL LOW (ref 150.0–400.0)
RBC: 4.23 Mil/uL (ref 3.87–5.11)
RDW: 15.3 % (ref 11.5–15.5)
WBC: 2.9 10*3/uL — ABNORMAL LOW (ref 4.0–10.5)

## 2014-09-24 LAB — HEPATIC FUNCTION PANEL
ALT: 25 U/L (ref 0–35)
AST: 38 U/L — ABNORMAL HIGH (ref 0–37)
Albumin: 3.8 g/dL (ref 3.5–5.2)
Alkaline Phosphatase: 73 U/L (ref 39–117)
Bilirubin, Direct: 0.1 mg/dL (ref 0.0–0.3)
Total Bilirubin: 0.3 mg/dL (ref 0.2–1.2)
Total Protein: 7.1 g/dL (ref 6.0–8.3)

## 2014-09-24 LAB — LIPID PANEL
Cholesterol: 174 mg/dL (ref 0–200)
HDL: 51.5 mg/dL (ref >39.00)
LDL Cholesterol: 101 mg/dL — ABNORMAL HIGH (ref 0–99)
NonHDL: 122.5
Total CHOL/HDL Ratio: 3
Triglycerides: 108 mg/dL (ref 0.0–149.0)
VLDL: 21.6 mg/dL (ref 0.0–40.0)

## 2014-09-24 LAB — BASIC METABOLIC PANEL
BUN: 17 mg/dL (ref 6–23)
CO2: 32 mEq/L (ref 19–32)
Calcium: 9 mg/dL (ref 8.4–10.5)
Chloride: 102 mEq/L (ref 96–112)
Creatinine, Ser: 0.83 mg/dL (ref 0.40–1.20)
GFR: 71.69 mL/min (ref >60.00)
Glucose, Bld: 84 mg/dL (ref 70–99)
Potassium: 3.7 mEq/L (ref 3.5–5.1)
Sodium: 137 mEq/L (ref 135–145)

## 2014-09-24 LAB — TSH: TSH: 1.56 u[IU]/mL (ref 0.35–4.50)

## 2014-10-01 ENCOUNTER — Encounter: Payer: Self-pay | Admitting: Internal Medicine

## 2014-10-01 ENCOUNTER — Ambulatory Visit (INDEPENDENT_AMBULATORY_CARE_PROVIDER_SITE_OTHER): Payer: Medicare Other | Admitting: Internal Medicine

## 2014-10-01 VITALS — BP 108/64 | Temp 98.4°F | Ht 67.0 in | Wt 150.7 lb

## 2014-10-01 DIAGNOSIS — Z9181 History of falling: Secondary | ICD-10-CM | POA: Diagnosis not present

## 2014-10-01 DIAGNOSIS — R05 Cough: Secondary | ICD-10-CM

## 2014-10-01 DIAGNOSIS — D72819 Decreased white blood cell count, unspecified: Secondary | ICD-10-CM | POA: Diagnosis not present

## 2014-10-01 DIAGNOSIS — D509 Iron deficiency anemia, unspecified: Secondary | ICD-10-CM | POA: Diagnosis not present

## 2014-10-01 DIAGNOSIS — R945 Abnormal results of liver function studies: Secondary | ICD-10-CM | POA: Insufficient documentation

## 2014-10-01 DIAGNOSIS — B351 Tinea unguium: Secondary | ICD-10-CM

## 2014-10-01 DIAGNOSIS — R059 Cough, unspecified: Secondary | ICD-10-CM

## 2014-10-01 DIAGNOSIS — Z0000 Encounter for general adult medical examination without abnormal findings: Secondary | ICD-10-CM | POA: Diagnosis not present

## 2014-10-01 DIAGNOSIS — Z79899 Other long term (current) drug therapy: Secondary | ICD-10-CM

## 2014-10-01 DIAGNOSIS — M810 Age-related osteoporosis without current pathological fracture: Secondary | ICD-10-CM

## 2014-10-01 DIAGNOSIS — R7989 Other specified abnormal findings of blood chemistry: Secondary | ICD-10-CM | POA: Insufficient documentation

## 2014-10-01 NOTE — Patient Instructions (Addendum)
Continue lifestyle intervention healthy eating and exercise . Your  White blood count is a bit lower than usual   pla repeat cbcdiff  In 1 month  And if ok can go back to  6 months with hematology.  Yearly flu vaccine  dexa scan next year ( 2 years)   Appoint [facility] .  Mammogram every 2 years

## 2014-10-01 NOTE — Progress Notes (Signed)
Pre visit review using our clinic review tool, if applicable. No additional management support is needed unless otherwise documented below in the visit note.  Chief Complaint  Patient presents with  . Medicare Wellness    labs anemia,cough    HPI: Belinda Harper 74 y.o. comes in today for Preventive Medicare wellness visit .   since last visit. No major change has had falling no injury  No dv pulm cause  See fall screen .   Toe nail fungus  On   Topical    Rx dr Allyson Sabal   Taking vit k2 for bone health she read about somewhere.  Not interested in osteoporosis  meds reviewed in past   Had  About 5 years of actonel  Cough recent y poss allergy has cat no cp   Some more winded recently no edema feels ok .  Still takes iron  For her anemia no active bleeding  Health Maintenance  Topic Date Due  . INFLUENZA VACCINE  11/24/2014  . MAMMOGRAM  09/04/2015  . TETANUS/TDAP  06/23/2019  . COLONOSCOPY  08/29/2021  . DEXA SCAN  Completed  . ZOSTAVAX  Completed  . PNA vac Low Risk Adult  Completed   Health Maintenance Review LIFESTYLE:  Exercise:  Water aerobics push mower  Tobacco/ETS:no Alcohol: social  Sugar beverages:no Sleep:7 Drug use: no MEDICARE DOCUMENT QUESTIONS  TO SCAN   Hearing:  Ok   Vision:  No limitations at present . Last eye check UTD  Safety:  Has smoke detector and wears seat belts.  No firearms. No excess sun exposure. Sees dentist regularly.  Falls:  See fall screen   Advance directive :  Has none   Memory: forgets , no concern from  Others  misplaces things keys   Depression: No anhedonia unusual crying or depressive symptoms  Nutrition: Eats well balanced diet; adequate calcium and vitamin D. No swallowing chewing problems.  Injury: no major injuries in the last six months.  Other healthcare providers:  Reviewed today .  Social:  Lives . No pets.   Preventive parameters: up-to-date  Reviewed   ADLS:   There are no problems or need for  assistance  driving, feeding, obtaining food, dressing, toileting and bathing, managing money using phone. She is independent.     ROS:  GEN/ HEENT: No fever, significant weight changes sweats headaches vision problems hearing changes, CV/ PULM; No chest pain shortness of breath cough, syncope,edema  change in exercise tolerance. GI /GU: No adominal pain, vomiting, change in bowel habits. No blood in the stool. No significant GU symptoms. SKIN/HEME: ,no acute skin rashes suspicious lesions or bleeding. No lymphadenopathy, nodules, masses.  NEURO/ PSYCH:  No neurologic signs such as weakness numbness. No depression anxiety. IMM/ Allergy: No unusual infections.  Allergy .   REST of 12 system review negative except as per HPI   Past Medical History  Diagnosis Date  . Unspecified vitamin D deficiency 04/06/2007  . HYPERCHOLESTEROLEMIA 05/19/2008  . 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 HEMORRHOIDS 05/19/2008  . ALLERGIC RHINITIS 01/03/2007  . ASTHMA 01/03/2007  . UTERINE PROLAPSE 04/06/2007  . OSTEOARTHRITIS 01/03/2007  . OSTEOPOROSIS 01/03/2007    hx of actonbel use for at least 5 years   . FRACTURE, ANKLE, LEFT 04/10/2009  . Blood transfusion 12 2010    hg 4 transfused 4 units  . Gastritis 2002  . DEPRESSION 01/03/2007    resolved  . Normal  cardiac stress test     2014    Family History  Problem Relation Age of Onset  . Hyperlipidemia Mother   . Heart disease Mother     CABG age 65  . Ovarian cancer Mother   . Uterine cancer Mother   . Arthritis Mother   . Osteoporosis Mother   . Heart disease Father     CABG age 54  . Arthritis Father   . Melanoma Father     History   Social History  . Marital Status: Divorced    Spouse Name: N/A  . Number of Children: 2  . Years of Education: N/A   Social History Main Topics  . Smoking status: Never Smoker   . Smokeless tobacco: Never Used  . Alcohol Use: Yes      Comment: rare  . Drug Use: No  . Sexual Activity: Not on file   Other Topics Concern  . Not on file   Social History Narrative   HH of  1   To rent out room    Retired Dispensing optician   Never smoked    Pet cat allergic    Divorced   Regular exericise  walking mowing the lawnswimming   Silver sneakers        Outpatient Encounter Prescriptions as of 10/01/2014  Medication Sig  . Calcium Citrate-Vitamin D (CITRACAL + D PO) Take 250 mg by mouth 2 (two) times daily. Has 250IU of vitamin D3 and 80mg  of magnesium  . docusate sodium (COLACE) 100 MG capsule Take 100 mg by mouth daily.  [name] EPINEPHrine 0.3 mg/0.3 mL IJ SOAJ injection Inject 0.3 mLs (0.3 mg total) into the muscle once.  . FERROUS GLUCONATE PO Take 81 mg by mouth 2 (two) times daily.  . Glucos-Chondroit-Hyaluron-MSM (GLUCOSAMINE CHONDROITIN JOINT) TABS Take by mouth daily.  . Menaquinone-7 (VITAMIN K2 PO) Take by mouth.  . Multiple Vitamins-Minerals (ONE-A-DAY WOMENS VITACRAVES PO) Take 1 tablet by mouth daily.  . vitamin C (ASCORBIC ACID) 500 MG tablet Take 500 mg by mouth 2 (two) times daily.   No facility-administered encounter medications on file as of 10/01/2014.    EXAM:  BP 108/64 mmHg  Temp(Src) 98.4 F (36.9 C) (Oral)  Ht 5\' 7"  (1.702 m)  Wt 150 lb 11.2 oz (68.357 kg)  BMI 23.60 kg/m2  Body mass index is 23.6 kg/(m^2).  Physical Exam: Vital signs reviewed YOM:AYOK is a well-developed well-nourished alert cooperative   who appears stated age in no acute distress.  HEENT: normocephalic atraumatic , Eyes: PERRL EOM's full, conjunctiva clear, Nares: paten,t no deformity discharge or tenderness., Ears: no deformity EAC's clear TMs with normal landmarks. Mouth: clear OP, no lesions, edema.  Moist mucous membranes. Dentition in adequate repair. NECK: supple without masses, thyromegaly or bruits. CHEST/PULM:  Clear to auscultation and percussion breath sounds equal no wheeze , rales or rhonchi. No chest wall  deformities or tenderness.Breast: normal by inspection . No dimpling, discharge, masses, tenderness or discharge . CV: PMI is nondisplaced, S1 S2 no gallops, faint sem when supine only nomurmurs, rubs. Peripheral pulses are full without delay.No JVD .  ABDOMEN: Bowel sounds normal nontender  No guard or rebound, no hepato splenomegal no CVA tenderness.  No hernia. Extremtities:  No clubbing cyanosis or edema, no acute joint swelling or redness no focal atrophy NEURO:  Oriented x3, cranial nerves 3-12 appear to be intact, no obvious focal weakness,gait within normal limits no abnormal reflexes or asymmetrical SKIN: No acute rashes normal  turgor, color, no bruising or petechiae. Toenails thickened   No rash PSYCH: Oriented, good eye contact, no obvious depression anxiety, cognition and judgment appear normal. LN: no cervical axillary inguinal adenopathy No noted deficits in memory, attention, and speech.   Lab Results  Component Value Date   WBC 2.9* 09/24/2014   HGB 11.8* 09/24/2014   HCT 36.4 09/24/2014   PLT 124.0* 09/24/2014   GLUCOSE 84 09/24/2014   CHOL 174 09/24/2014   TRIG 108.0 09/24/2014   HDL 51.50 09/24/2014   LDLDIRECT 141.9 08/06/2012   LDLCALC 101* 09/24/2014   ALT 25 09/24/2014   AST 38* 09/24/2014   NA 137 09/24/2014   K 3.7 09/24/2014   CL 102 09/24/2014   CREATININE 0.83 09/24/2014   BUN 17 09/24/2014   CO2 32 09/24/2014   TSH 1.56 09/24/2014   INR 1.04 04/10/2009   BP Readings from Last 3 Encounters:  10/01/14 108/64  05/20/14 150/90  12/18/13 132/68   Wt Readings from Last 3 Encounters:  10/01/14 150 lb 11.2 oz (68.357 kg)  05/20/14 156 lb (70.761 kg)  12/18/13 155 lb (70.308 kg)     ASSESSMENT AND PLAN:  Discussed the following assessment and plan:  Visit for preventive health examination  Medication management  Medicare annual wellness visit, initial  Iron deficiency anemia  Leukopenia  - lower than baseline  lympho predominant  with dec  platelet  Cough new - prob allergy fu if [progressive  Hx of fall  Osteoporosis - dexa -2.7 last year not interested in medication can check scannext year  Onychomycosis - topical rx  per dr Allyson Sabal  Abnormal LFTs new - minor will repeat nxt lab Pan cytopenia   Anemia actually better but wbc and plt down with lymphocytic predominance  unceertain lfts  Cough follow 2 new problems  1 worsening ( WBC)  Fu in 1 months lab cbc diff and  and b12  Share  results with heme ik ok yearly check or as appropriate  Patient Care Team: Burnis Medin, MD as PCP - General Servando Salina, MD (Obstetrics and Gynecology) Inda Castle, MD (Gastroenterology) Clent Jacks, MD (Ophthalmology) Mikey Bussing, DDS (Dentistry) Druscilla Brownie, MD as Consulting Physician (Dermatology) [name], MD as Consulting Physician (Hematology and Oncology)  Patient Instructions  Continue lifestyle intervention healthy eating and exercise . Your  White blood count is a bit lower than usual   pla repeat cbcdiff  In 1 month  And if ok can go back to  6 months with hematology.  Yearly flu vaccine  dexa scan next year ( 2 years)   Appoint HCPOA .  Mammogram every 2 years     Standley Brooking. Panosh M.D.

## 2014-11-03 ENCOUNTER — Other Ambulatory Visit (INDEPENDENT_AMBULATORY_CARE_PROVIDER_SITE_OTHER): Payer: Medicare Other

## 2014-11-03 DIAGNOSIS — R7989 Other specified abnormal findings of blood chemistry: Secondary | ICD-10-CM

## 2014-11-03 DIAGNOSIS — Z79899 Other long term (current) drug therapy: Secondary | ICD-10-CM | POA: Diagnosis not present

## 2014-11-03 DIAGNOSIS — D509 Iron deficiency anemia, unspecified: Secondary | ICD-10-CM | POA: Diagnosis not present

## 2014-11-03 DIAGNOSIS — R945 Abnormal results of liver function studies: Secondary | ICD-10-CM

## 2014-11-03 LAB — CBC WITH DIFFERENTIAL/PLATELET
Basophils Absolute: 0 10*3/uL (ref 0.0–0.1)
Basophils Relative: 0.7 % (ref 0.0–3.0)
Eosinophils Absolute: 0.2 10*3/uL (ref 0.0–0.7)
Eosinophils Relative: 4.2 % (ref 0.0–5.0)
HCT: 35.6 % — ABNORMAL LOW (ref 36.0–46.0)
Hemoglobin: 11.6 g/dL — ABNORMAL LOW (ref 12.0–15.0)
Lymphocytes Relative: 42.9 % (ref 12.0–46.0)
Lymphs Abs: 1.9 10*3/uL (ref 0.7–4.0)
MCHC: 32.5 g/dL (ref 30.0–36.0)
MCV: 84.8 fl (ref 78.0–100.0)
Monocytes Absolute: 0.3 10*3/uL (ref 0.1–1.0)
Monocytes Relative: 7.3 % (ref 3.0–12.0)
Neutro Abs: 1.9 10*3/uL (ref 1.4–7.7)
Neutrophils Relative %: 44.9 % (ref 43.0–77.0)
Platelets: 159 10*3/uL (ref 150.0–400.0)
RBC: 4.2 Mil/uL (ref 3.87–5.11)
RDW: 15.7 % — ABNORMAL HIGH (ref 11.5–15.5)
WBC: 4.3 10*3/uL (ref 4.0–10.5)

## 2014-11-03 LAB — HEPATIC FUNCTION PANEL
ALT: 14 U/L (ref 0–35)
AST: 24 U/L (ref 0–37)
Albumin: 3.9 g/dL (ref 3.5–5.2)
Alkaline Phosphatase: 73 U/L (ref 39–117)
Bilirubin, Direct: 0.1 mg/dL (ref 0.0–0.3)
Total Bilirubin: 0.4 mg/dL (ref 0.2–1.2)
Total Protein: 7.5 g/dL (ref 6.0–8.3)

## 2014-11-03 LAB — VITAMIN B12: Vitamin B-12: 626 pg/mL (ref 211–911)

## 2014-12-08 ENCOUNTER — Telehealth: Payer: Self-pay | Admitting: Hematology and Oncology

## 2014-12-08 NOTE — Telephone Encounter (Signed)
Pt called to r/s due to out of town this week and also r/s out to 6 months per herself/MD pt states..... Belinda Harper

## 2014-12-09 ENCOUNTER — Other Ambulatory Visit: Payer: Medicare Other

## 2014-12-09 ENCOUNTER — Ambulatory Visit: Payer: Medicare Other | Admitting: Hematology and Oncology

## 2015-02-05 ENCOUNTER — Emergency Department (HOSPITAL_COMMUNITY): Payer: Medicare Other

## 2015-02-05 ENCOUNTER — Encounter (HOSPITAL_COMMUNITY): Payer: Self-pay | Admitting: Emergency Medicine

## 2015-02-05 ENCOUNTER — Ambulatory Visit (INDEPENDENT_AMBULATORY_CARE_PROVIDER_SITE_OTHER): Payer: Medicare Other | Admitting: Family Medicine

## 2015-02-05 ENCOUNTER — Telehealth: Payer: Self-pay | Admitting: Internal Medicine

## 2015-02-05 ENCOUNTER — Inpatient Hospital Stay (HOSPITAL_COMMUNITY)
Admission: EM | Admit: 2015-02-05 | Discharge: 2015-02-12 | DRG: 872 | Disposition: A | Discharge status: Home / Self Care | Payer: Medicare Other | Attending: Internal Medicine | Admitting: Internal Medicine

## 2015-02-05 ENCOUNTER — Encounter: Payer: Self-pay | Admitting: Family Medicine

## 2015-02-05 VITALS — BP 120/70 | HR 112 | Temp 102.7°F | Ht 67.0 in | Wt 151.8 lb

## 2015-02-05 DIAGNOSIS — Z8249 Family history of ischemic heart disease and other diseases of the circulatory system: Secondary | ICD-10-CM

## 2015-02-05 DIAGNOSIS — R0600 Dyspnea, unspecified: Secondary | ICD-10-CM

## 2015-02-05 DIAGNOSIS — A419 Sepsis, unspecified organism: Secondary | ICD-10-CM

## 2015-02-05 DIAGNOSIS — Z8619 Personal history of other infectious and parasitic diseases: Secondary | ICD-10-CM

## 2015-02-05 DIAGNOSIS — M810 Age-related osteoporosis without current pathological fracture: Secondary | ICD-10-CM | POA: Diagnosis present

## 2015-02-05 DIAGNOSIS — L03116 Cellulitis of left lower limb: Secondary | ICD-10-CM

## 2015-02-05 DIAGNOSIS — K5900 Constipation, unspecified: Secondary | ICD-10-CM | POA: Diagnosis present

## 2015-02-05 DIAGNOSIS — E8770 Fluid overload, unspecified: Secondary | ICD-10-CM | POA: Diagnosis not present

## 2015-02-05 DIAGNOSIS — M7989 Other specified soft tissue disorders: Secondary | ICD-10-CM | POA: Diagnosis present

## 2015-02-05 DIAGNOSIS — L0390 Cellulitis, unspecified: Secondary | ICD-10-CM | POA: Insufficient documentation

## 2015-02-05 DIAGNOSIS — E876 Hypokalemia: Secondary | ICD-10-CM | POA: Diagnosis not present

## 2015-02-05 DIAGNOSIS — F329 Major depressive disorder, single episode, unspecified: Secondary | ICD-10-CM | POA: Diagnosis present

## 2015-02-05 DIAGNOSIS — N390 Urinary tract infection, site not specified: Secondary | ICD-10-CM | POA: Diagnosis present

## 2015-02-05 DIAGNOSIS — E559 Vitamin D deficiency, unspecified: Secondary | ICD-10-CM | POA: Diagnosis present

## 2015-02-05 DIAGNOSIS — M25572 Pain in left ankle and joints of left foot: Secondary | ICD-10-CM | POA: Diagnosis not present

## 2015-02-05 DIAGNOSIS — D649 Anemia, unspecified: Secondary | ICD-10-CM | POA: Diagnosis present

## 2015-02-05 DIAGNOSIS — E785 Hyperlipidemia, unspecified: Secondary | ICD-10-CM | POA: Diagnosis present

## 2015-02-05 DIAGNOSIS — F419 Anxiety disorder, unspecified: Secondary | ICD-10-CM | POA: Diagnosis present

## 2015-02-05 DIAGNOSIS — B9620 Unspecified Escherichia coli [E. coli] as the cause of diseases classified elsewhere: Secondary | ICD-10-CM | POA: Diagnosis present

## 2015-02-05 DIAGNOSIS — E8771 Transfusion associated circulatory overload: Secondary | ICD-10-CM | POA: Diagnosis not present

## 2015-02-05 DIAGNOSIS — D509 Iron deficiency anemia, unspecified: Secondary | ICD-10-CM | POA: Diagnosis present

## 2015-02-05 DIAGNOSIS — K5909 Other constipation: Secondary | ICD-10-CM | POA: Diagnosis not present

## 2015-02-05 DIAGNOSIS — D500 Iron deficiency anemia secondary to blood loss (chronic): Secondary | ICD-10-CM | POA: Diagnosis present

## 2015-02-05 HISTORY — DX: Personal history of other infectious and parasitic diseases: Z86.19

## 2015-02-05 HISTORY — DX: Sepsis, unspecified organism: A41.9

## 2015-02-05 HISTORY — DX: Cellulitis, unspecified: L03.90

## 2015-02-05 LAB — COMPREHENSIVE METABOLIC PANEL
ALT: 18 U/L (ref 14–54)
AST: 32 U/L (ref 15–41)
Albumin: 3.9 g/dL (ref 3.5–5.0)
Alkaline Phosphatase: 63 U/L (ref 38–126)
Anion gap: 9 (ref 5–15)
BUN: 23 mg/dL — ABNORMAL HIGH (ref 6–20)
CO2: 26 mmol/L (ref 22–32)
Calcium: 9.1 mg/dL (ref 8.9–10.3)
Chloride: 100 mmol/L — ABNORMAL LOW (ref 101–111)
Creatinine, Ser: 0.86 mg/dL (ref 0.44–1.00)
GFR calc Af Amer: >60 mL/min (ref >60)
GFR calc non Af Amer: >60 mL/min (ref >60)
Glucose, Bld: 124 mg/dL — ABNORMAL HIGH (ref 65–99)
Potassium: 3.5 mmol/L (ref 3.5–5.1)
Sodium: 135 mmol/L (ref 135–145)
Total Bilirubin: 0.8 mg/dL (ref 0.3–1.2)
Total Protein: 7.5 g/dL (ref 6.5–8.1)

## 2015-02-05 LAB — CBC WITH DIFFERENTIAL/PLATELET
Basophils Absolute: 0 10*3/uL (ref 0.0–0.1)
Basophils Relative: 0 %
Eosinophils Absolute: 0 10*3/uL (ref 0.0–0.7)
Eosinophils Relative: 0 %
HCT: 36.4 % (ref 36.0–46.0)
Hemoglobin: 11.6 g/dL — ABNORMAL LOW (ref 12.0–15.0)
Lymphocytes Relative: 4 %
Lymphs Abs: 0.6 10*3/uL — ABNORMAL LOW (ref 0.7–4.0)
MCH: 28 pg (ref 26.0–34.0)
MCHC: 31.9 g/dL (ref 30.0–36.0)
MCV: 87.7 fL (ref 78.0–100.0)
Monocytes Absolute: 0.3 10*3/uL (ref 0.1–1.0)
Monocytes Relative: 3 %
Neutro Abs: 12.4 10*3/uL — ABNORMAL HIGH (ref 1.7–7.7)
Neutrophils Relative %: 93 %
Platelets: 156 10*3/uL (ref 150–400)
RBC: 4.15 MIL/uL (ref 3.87–5.11)
RDW: 16.2 % — ABNORMAL HIGH (ref 11.5–15.5)
WBC: 13.4 10*3/uL — ABNORMAL HIGH (ref 4.0–10.5)

## 2015-02-05 LAB — URINALYSIS, ROUTINE W REFLEX MICROSCOPIC
Bilirubin Urine: NEGATIVE
Glucose, UA: NEGATIVE mg/dL
Ketones, ur: NEGATIVE mg/dL
Nitrite: POSITIVE — AB
Protein, ur: 100 mg/dL — AB
Specific Gravity, Urine: 1.017 (ref 1.005–1.030)
Urobilinogen, UA: 1 mg/dL (ref 0.0–1.0)
pH: 8.5 — ABNORMAL HIGH (ref 5.0–8.0)

## 2015-02-05 LAB — LACTIC ACID, PLASMA: Lactic Acid, Venous: 0.8 mmol/L (ref 0.5–2.0)

## 2015-02-05 LAB — C-REACTIVE PROTEIN: CRP: 10.7 mg/dL — ABNORMAL HIGH (ref <1.0)

## 2015-02-05 LAB — URINE MICROSCOPIC-ADD ON

## 2015-02-05 LAB — I-STAT CG4 LACTIC ACID, ED: Lactic Acid, Venous: 1.19 mmol/L (ref 0.5–2.0)

## 2015-02-05 LAB — SEDIMENTATION RATE: Sed Rate: 34 mm/hr — ABNORMAL HIGH (ref 0–22)

## 2015-02-05 LAB — CK: Total CK: 96 U/L (ref 38–234)

## 2015-02-05 MED ORDER — VANCOMYCIN HCL IN DEXTROSE 1-5 GM/200ML-% IV SOLN
1000.0000 mg | Freq: Once | INTRAVENOUS | Status: AC
  Administered 2015-02-05: 1000 mg via INTRAVENOUS
  Filled 2015-02-05: qty 200

## 2015-02-05 MED ORDER — VANCOMYCIN HCL 500 MG IV SOLR
500.0000 mg | Freq: Two times a day (BID) | INTRAVENOUS | Status: DC
  Administered 2015-02-06 – 2015-02-08 (×5): 500 mg via INTRAVENOUS
  Filled 2015-02-05 (×7): qty 500

## 2015-02-05 MED ORDER — PIPERACILLIN-TAZOBACTAM 3.375 G IVPB 30 MIN
3.3750 g | Freq: Once | INTRAVENOUS | Status: AC
  Administered 2015-02-05: 3.375 g via INTRAVENOUS
  Filled 2015-02-05: qty 50

## 2015-02-05 MED ORDER — FERROUS GLUCONATE 324 (38 FE) MG PO TABS
1296.0000 mg | ORAL_TABLET | Freq: Every day | ORAL | Status: DC
  Administered 2015-02-05: 1296 mg via ORAL
  Filled 2015-02-05 (×2): qty 4

## 2015-02-05 MED ORDER — SODIUM CHLORIDE 0.9 % IV SOLN
INTRAVENOUS | Status: DC
  Administered 2015-02-05 – 2015-02-07 (×2): via INTRAVENOUS

## 2015-02-05 MED ORDER — PIPERACILLIN-TAZOBACTAM 3.375 G IVPB
3.3750 g | Freq: Three times a day (TID) | INTRAVENOUS | Status: DC
  Administered 2015-02-06: 3.375 g via INTRAVENOUS
  Filled 2015-02-05 (×2): qty 50

## 2015-02-05 MED ORDER — SODIUM CHLORIDE 0.9 % IV BOLUS (SEPSIS)
500.0000 mL | INTRAVENOUS | Status: AC
  Administered 2015-02-05: 500 mL via INTRAVENOUS

## 2015-02-05 MED ORDER — ONDANSETRON HCL 4 MG PO TABS
4.0000 mg | ORAL_TABLET | Freq: Four times a day (QID) | ORAL | Status: DC | PRN
  Administered 2015-02-06: 4 mg via ORAL
  Filled 2015-02-05: qty 1

## 2015-02-05 MED ORDER — ACETAMINOPHEN 650 MG RE SUPP: 650.0000 mg | Freq: Four times a day (QID) | RECTAL | Status: DC | PRN

## 2015-02-05 MED ORDER — SODIUM CHLORIDE 0.9 % IV BOLUS (SEPSIS)
1000.0000 mL | INTRAVENOUS | Status: AC
  Administered 2015-02-05 (×2): 1000 mL via INTRAVENOUS

## 2015-02-05 MED ORDER — SODIUM CHLORIDE 0.9 % IJ SOLN
3.0000 mL | Freq: Two times a day (BID) | INTRAMUSCULAR | Status: DC
  Administered 2015-02-09 – 2015-02-12 (×6): 3 mL via INTRAVENOUS

## 2015-02-05 MED ORDER — ACETAMINOPHEN 325 MG PO TABS
650.0000 mg | ORAL_TABLET | Freq: Four times a day (QID) | ORAL | Status: DC | PRN
  Administered 2015-02-07 – 2015-02-08 (×2): 650 mg via ORAL
  Filled 2015-02-05 (×2): qty 2

## 2015-02-05 MED ORDER — ENOXAPARIN SODIUM 40 MG/0.4ML ~~LOC~~ SOLN
40.0000 mg | SUBCUTANEOUS | Status: DC
  Administered 2015-02-05 – 2015-02-11 (×7): 40 mg via SUBCUTANEOUS
  Filled 2015-02-05 (×8): qty 0.4

## 2015-02-05 MED ORDER — ACETAMINOPHEN 500 MG PO TABS
1000.0000 mg | ORAL_TABLET | Freq: Once | ORAL | Status: AC
  Administered 2015-02-05: 1000 mg via ORAL
  Filled 2015-02-05: qty 2

## 2015-02-05 MED ORDER — ONDANSETRON HCL 4 MG/2ML IJ SOLN: 4.0000 mg | Freq: Four times a day (QID) | INTRAMUSCULAR | Status: DC | PRN

## 2015-02-05 MED ORDER — DOCUSATE SODIUM 100 MG PO CAPS
100.0000 mg | ORAL_CAPSULE | Freq: Every day | ORAL | Status: DC
  Administered 2015-02-05 – 2015-02-11 (×7): 100 mg via ORAL
  Filled 2015-02-05 (×9): qty 1

## 2015-02-05 NOTE — ED Notes (Addendum)
Report given to [facility] on 5 th floor

## 2015-02-05 NOTE — Progress Notes (Signed)
Patient presents to Ed with left leg redness, tenderness and fever.  EDCM spoke to patient at bedside.  Patient lives at home alone.  She reports she has a girl who rents a room from her.  Patient confirms her pcp is Dr. Shanon Ace.  Patient's husband is at bedside.  Patient reports she has a walker, shower seat and access to a wheelchair if needed.  Patient reports she is usually able to perform her own ADL's without difficulty.  Patient states she has been offered home health services in the past, but reports she does not need services at this time.  EDCM offered list of home health agencies in [facility], but patient politely refused.  No further EDCM needs at this time.

## 2015-02-05 NOTE — ED Notes (Signed)
Hospitalist at bedside 

## 2015-02-05 NOTE — ED Provider Notes (Signed)
CSN: [id number]     Arrival date & time 02/05/15  1657 History   First MD Initiated Contact with Patient 02/05/15 1810     Chief Complaint  Patient presents with  . Leg Pain     (Consider location/radiation/quality/duration/timing/severity/associated sxs/prior Treatment) HPI Comments: 74yo F w/ HLD, osteoporosis, previous L ankle fracture repair who p/w L leg pain. This morning the patient again noticing some redness, swelling, and pain of her left lower leg. Her symptoms worsened and she went to her PCP and was sent here for further evaluation due to concerns for infection. She worked in her yard yesterday but denies any trauma or recent injury to her leg. She does have a history of left ankle fracture requiring surgical repair but this was several years ago. She was not aware that she was running a fever but she has felt chilled. No cough, chest pain, shortness of breath, abdominal pain, or vomiting.  Patient is a 74 y.o. female presenting with leg pain. The history is provided by the patient.  Leg Pain   Past Medical History  Diagnosis Date  . Unspecified vitamin D deficiency 04/06/2007  . HYPERCHOLESTEROLEMIA 05/19/2008  . 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 HEMORRHOIDS 05/19/2008  . ALLERGIC RHINITIS 01/03/2007  . ASTHMA 01/03/2007  . UTERINE PROLAPSE 04/06/2007  . OSTEOARTHRITIS 01/03/2007  . OSTEOPOROSIS 01/03/2007    hx of actonbel use for at least 5 years   . FRACTURE, ANKLE, LEFT 04/10/2009  . Blood transfusion 12 2010    hg 4 transfused 4 units  . Gastritis 2002  . DEPRESSION 01/03/2007    resolved  . Normal cardiac stress test     2014   Past Surgical History  Procedure Laterality Date  . Panendoscopy      small bowel bx  . Ankle fracture surgery  02/2009    left ankle fracture and dislocation  . Cataract extraction w/ intraocular lens implant  2000    right eye  . Orif finger / thumb fracture  1970   reattached  . Mass excision Left 09/11/2012    Procedure: 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; Rotation Flap;  Surgeon: Wynonia Sours, MD;  Location: [facility];  Service: Orthopedics;  Laterality: Left;   Family History  Problem Relation Age of Onset  . Hyperlipidemia Mother   . Heart disease Mother     CABG age 43  . Ovarian cancer Mother   . Uterine cancer Mother   . Arthritis Mother   . Osteoporosis Mother   . Heart disease Father     CABG age 6  . Arthritis Father   . Melanoma Father    Social History  Substance Use Topics  . Smoking status: Never Smoker   . Smokeless tobacco: Never Used  . Alcohol Use: Yes     Comment: rare   OB History    No data available     Review of Systems  10 Systems reviewed and are negative for acute change except as noted in the HPI.   Allergies  Other; Sulfa antibiotics; and Sulfamethoxazole  Home Medications   Prior to Admission medications   Medication Sig Start Date End Date Taking? Authorizing Provider  Calcium Citrate-Vitamin D (CITRACAL + D PO) Take 250 mg by mouth 2 (two) times daily. Has 250IU of vitamin D3 and 80mg  of magnesium    Historical Provider, MD  docusate sodium (COLACE) 100 MG  capsule Take 100 mg by mouth daily.    Historical Provider, MD  EPINEPHrine 0.3 mg/0.3 mL IJ SOAJ injection Inject 0.3 mLs (0.3 mg total) into the muscle once. 01/27/14   Burnis Medin, MD  FERROUS GLUCONATE PO Take 81 mg by mouth 2 (two) times daily.    Historical Provider, MD  Glucos-Chondroit-Hyaluron-MSM (GLUCOSAMINE CHONDROITIN JOINT) TABS Take by mouth daily.    Historical Provider, MD  Menaquinone-7 (VITAMIN K2 PO) Take by mouth.    Historical Provider, MD  Multiple Vitamins-Minerals (ONE-A-DAY WOMENS VITACRAVES PO) Take 1 tablet by mouth daily.    Historical Provider, MD  vitamin C (ASCORBIC ACID) 500 MG tablet Take 500 mg by mouth 2 (two) times daily.    Historical Provider, MD   BP 135/70 mmHg   Pulse 109  Temp(Src) 100.6 F (38.1 C) (Oral)  SpO2 98% Physical Exam  Constitutional: She is oriented to person, place, and time. She appears well-developed and well-nourished. No distress.  HENT:  Head: Normocephalic and atraumatic.  dry mucous membranes  Eyes: Conjunctivae are normal. Pupils are equal, round, and reactive to light.  Neck: Neck supple.  Cardiovascular: Regular rhythm, normal heart sounds and intact distal pulses.   No murmur heard. tachycardic  Pulmonary/Chest: Effort normal and breath sounds normal.  Abdominal: Soft. Bowel sounds are normal. She exhibits no distension. There is no tenderness.  Musculoskeletal:  Edema, erythema, and tenderness to palpation of lower left leg involving ankle, tenderness along surgical scar w/ no drainage; normal ROM and sensation L ankle/foot  Neurological: She is alert and oriented to person, place, and time.  Fluent speech  Skin: Skin is warm and dry.     Psychiatric: She has a normal mood and affect. Judgment normal.  Nursing note and vitals reviewed.   ED Course  Procedures (including critical care time) Labs Review Labs Reviewed  CULTURE, BLOOD (ROUTINE X 2)  CULTURE, BLOOD (ROUTINE X 2)  URINE CULTURE  COMPREHENSIVE METABOLIC PANEL  CBC WITH DIFFERENTIAL/PLATELET  URINALYSIS, ROUTINE W REFLEX MICROSCOPIC (NOT AT [facility])  I-STAT CG4 LACTIC ACID, ED    Imaging Review Dg Tibia/fibula Left  02/05/2015  CLINICAL DATA:  74 year old female with lower extremity redness and fever. Cellulitis. Initial encounter. EXAM: LEFT TIBIA AND FIBULA - 2 VIEW COMPARISON:  Tib-fib series 11 10/12/2008. FINDINGS: No knee joint effusion identified. Chronic medial joint space loss and degenerative spurring at the knee. Patella appears intact. Distal femur and proximal tibia and fibula appear intact. Distal tibia and fibula ORIF hardware following the 2010 fractures. The proximal fibula head fracture is healed. Hardware appears intact without  evidence of loosening. Distal tibia and fibula appear intact. Visible calcaneus intact. No subcutaneous gas. No other radiopaque foreign body identified. IMPRESSION: Posttraumatic and postoperative changes to the left tibia and fibula. No acute osseous abnormality identified. No subcutaneous gas. Electronically Signed   By: Genevie Ann M.D.   On: 02/05/2015 19:19   I have personally reviewed and evaluated these lab results as part of my medical decision-making.   EKG Interpretation None     Medications  sodium chloride 0.9 % bolus 1,000 mL (not administered)    Followed by  sodium chloride 0.9 % bolus 500 mL (not administered)  piperacillin-tazobactam (ZOSYN) IVPB 3.375 g (3.375 g Intravenous New Bag/Given 02/05/15 1845)  vancomycin (VANCOCIN) IVPB 1000 mg/200 mL premix (not administered)  acetaminophen (TYLENOL) tablet 1,000 mg (1,000 mg Oral Given 02/05/15 1843)    MDM   Final diagnoses:  Left leg cellulitis  74 year old female who presents with 1 day of leg swelling, pain, and erythema. She was sent from PCPs office for further evaluation. On arrival, patient was febrile to 102.7 and tachycardic at 112. The rest of her vitals were stable. She had erythema, swelling, and tenderness around her left lower leg concerning for cellulitis. No crepitus noted on exam. Because of her abnormal vital signs and likely infection, initiated a code sepsis with vancomycin, Zosyn, IV fluids, as well as labwork including cultures and lactate. Gave the patient Tylenol for her fever. Obtained plain film of lower leg because of known hardware and concern for deep space infection.  Labs showed nl lactate, WBC 13000, normal Creatinine, UA c/w UTI. Films negative for gas or bony abnormality.  I am concerned about the rapid progression of the patient's symptoms and her abnormal vital signs. She will be admitted to general medicine for further care.  Sharlett Iles, MD 02/06/15 1000

## 2015-02-05 NOTE — ED Notes (Signed)
Attempted to call report to floor. RN reports she is in a patient room and will call back.

## 2015-02-05 NOTE — Telephone Encounter (Signed)
Patient Name: Belinda Harper  DOB: 1940/09/15    Initial Comment Caller states his mother is anemic and the pt woke up hoarse voice, no energy and is confused.    Nurse Assessment  Nurse: Mallie Mussel, RN, Alveta Heimlich Date/Time Eilene Ghazi Time): 02/05/2015 2:26:44 PM  Confirm and document reason for call. If symptomatic, describe symptoms. ---Caller states that his mother's voice is cracking, not hoarse. She is acting confused. This is something new for her. She is real week. She has to have help to walk. She was helping with yard work yesterday and she was completely find until this morning.  Has the patient traveled out of the country within the last 30 days? ---No  Does the patient have any new or worsening symptoms? ---Yes  Will a triage be completed? ---Yes  Related visit to physician within the last 2 weeks? ---No  Does the PT have any chronic conditions? (i.e. diabetes, asthma, etc.) ---Yes  List chronic conditions. ---Anemia     Guidelines    Guideline Title Affirmed Question Affirmed Notes  Confusion - Delirium [1] Acting confused (e.g., disoriented, slurred speech) AND [2] brief (now gone)    Final Disposition User   See Physician within 4 Hours (or PCP triage) Mallie Mussel, RN, Alveta Heimlich    Comments  Appointment scheduled for today with Dr. Colin Benton at 4:15pm.   Referrals  REFERRED TO PCP OFFICE   Disagree/Comply: Comply

## 2015-02-05 NOTE — ED Notes (Signed)
Pt ambulated to restroom with steady gait.

## 2015-02-05 NOTE — Progress Notes (Signed)
HPI:  Belinda Harper is a pleasant 74 yo pt of Dr. Regis Bill with a PMH significant for Asthma, OA, remote ankle fx and anemia here for an acute visit for confusion. She reports she woke up this morning with shakes, confusion, fever, chills and dizziness. She reports she has felt gradually worse throughout the day. She denies CP, SOB, cough, wheezing, sore throat, sinus congestion, ear pain, NVD, abd pain, skin rash, HA, neck pain or dysuria. On detailed questioning she does admit to worsening L ankle pain  Today that now is making ambulation difficult.  ROS: See pertinent positives and negatives per HPI.  Past Medical History  Diagnosis Date  . Unspecified vitamin D deficiency 04/06/2007  . HYPERCHOLESTEROLEMIA 05/19/2008  . 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 HEMORRHOIDS 05/19/2008  . ALLERGIC RHINITIS 01/03/2007  . ASTHMA 01/03/2007  . UTERINE PROLAPSE 04/06/2007  . OSTEOARTHRITIS 01/03/2007  . OSTEOPOROSIS 01/03/2007    hx of actonbel use for at least 5 years   . FRACTURE, ANKLE, LEFT 04/10/2009  . Blood transfusion 12 2010    hg 4 transfused 4 units  . Gastritis 2002  . DEPRESSION 01/03/2007    resolved  . Normal cardiac stress test     2014    Past Surgical History  Procedure Laterality Date  . Panendoscopy      small bowel bx  . Ankle fracture surgery  02/2009    left ankle fracture and dislocation  . Cataract extraction w/ intraocular lens implant  2000    right eye  . Orif finger / thumb fracture  1970    reattached  . Mass excision Left 09/11/2012    Procedure: 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; Rotation Flap;  Surgeon: Wynonia Sours, MD;  Location: [facility];  Service: Orthopedics;  Laterality: Left;    Family History  Problem Relation Age of Onset  . Hyperlipidemia Mother   . Heart disease Mother     CABG age 66  . Ovarian cancer Mother   . Uterine cancer Mother   .  Arthritis Mother   . Osteoporosis Mother   . Heart disease Father     CABG age 4  . Arthritis Father   . Melanoma Father     Social History   Social History  . Marital Status: Divorced    Spouse Name: N/A  . Number of Children: 2  . Years of Education: N/A   Social History Main Topics  . Smoking status: Never Smoker   . Smokeless tobacco: Never Used  . Alcohol Use: Yes     Comment: rare  . Drug Use: No  . Sexual Activity: Not Asked   Other Topics Concern  . None   Social History Narrative   HH of  1   To rent out room    Retired Dispensing optician   Never smoked    Pet cat allergic    Divorced   Regular exericise  walking mowing the lawnswimming   Silver sneakers         Current outpatient prescriptions:  .  Calcium Citrate-Vitamin D (CITRACAL + D PO), Take 250 mg by mouth 2 (two) times daily. Has 250IU of vitamin D3 and 80mg  of magnesium, Disp: , Rfl:  .  docusate sodium (COLACE) 100 MG capsule, Take 100 mg by mouth daily., Disp: , Rfl:  .  EPINEPHrine 0.3 mg/0.3 mL IJ SOAJ injection, Inject 0.3  mLs (0.3 mg total) into the muscle once., Disp: 1 Device, Rfl: 0 .  FERROUS GLUCONATE PO, Take 81 mg by mouth 2 (two) times daily., Disp: , Rfl:  .  Glucos-Chondroit-Hyaluron-MSM (GLUCOSAMINE CHONDROITIN JOINT) TABS, Take by mouth daily., Disp: , Rfl:  .  Menaquinone-7 (VITAMIN K2 PO), Take by mouth., Disp: , Rfl:  .  Multiple Vitamins-Minerals (ONE-A-DAY WOMENS VITACRAVES PO), Take 1 tablet by mouth daily., Disp: , Rfl:  .  vitamin C (ASCORBIC ACID) 500 MG tablet, Take 500 mg by mouth 2 (two) times daily., Disp: , Rfl:   EXAM:  Filed Vitals:   02/05/15 1618  BP: 120/70  Pulse: 112  Temp: 102.7 F (39.3 C)    Body mass index is 23.77 kg/(m^2).  GENERAL: vitals reviewed and listed above, alert, but appears ill, shaky  HEENT: atraumatic, conjunttiva clear, no obvious abnormalities on inspection of external nose and ears  NECK: no obvious masses on  inspection  LUNGS: clear to auscultation bilaterally, no wheezes, rales or rhonchi, good air movement  CV: HRRR, no peripheral edema  MS: moves all extremities without noticeable abnormality except for not bearing weight on L foot and on inspection her L ankle is swollen, red, hot and TTP with induration and erythema of skin on lower extremity to just below the knee.  PSYCH: pleasant and cooperative, somewhat confused about events of the day, but able to recall person, Place, time, date and president  ASSESSMENT AND PLAN:  Discussed the following assessment and plan:  Sepsis, due to unspecified organism ([place])  Pain in joint, ankle and foot, left  Cellulitis of left lower extremity  -high fever, tachy cardia and now other obvious source of infection other then finding in L lower extremity -given degree of symptoms I am concerned for systemic involvement, sepsis likely from joint or skin infection vs other -advised immediately evaluation in the emergency room - discussed transport and ex-husband whom drove her and pt prefer for him to drive her to the emergency room, advised staff to notify emergency room staff -Patient advised to return or notify a doctor immediately if symptoms worsen or persist or new concerns arise.  There are no Patient Instructions on file for this visit.   Colin Benton R.

## 2015-02-05 NOTE — Progress Notes (Signed)
ANTIBIOTIC CONSULT NOTE - INITIAL  Pharmacy Consult for Vancomycin/Zosyn Indication: cellulitis/r/o sepsis  Allergies  Allergen Reactions  . Other     Bee sting  . Sulfa Antibiotics   . Sulfamethoxazole     REACTION: unspecified    Patient Measurements: Height: 67 inches Total Body Weight: 68.9 kg Ideal Body weight: 61 kg   Vital Signs: Temp: 102.7 F (39.3 C) (10/13 1918) Temp Source: Oral (10/13 1918) BP: 105/92 mmHg (10/13 1918) Pulse Rate: 112 (10/13 1918) Intake/Output from previous day:   Intake/Output from this shift:    Labs:  Recent Labs  02/05/15 1835  WBC 13.4*  HGB 11.6*  PLT 156  CREATININE 0.86   Estimated Creatinine Clearance: 56.7 mL/min (by C-G formula based on Cr of 0.86). No results for input(s): VANCOTROUGH, VANCOPEAK, VANCORANDOM, GENTTROUGH, GENTPEAK, GENTRANDOM, TOBRATROUGH, TOBRAPEAK, TOBRARND, AMIKACINPEAK, AMIKACINTROU, AMIKACIN in the last 72 hours.   Microbiology: No results found for this or any previous visit (from the past 720 hour(s)).  Medical History: Past Medical History  Diagnosis Date  . Unspecified vitamin D deficiency 04/06/2007  . HYPERCHOLESTEROLEMIA 05/19/2008  . 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 HEMORRHOIDS 05/19/2008  . ALLERGIC RHINITIS 01/03/2007  . ASTHMA 01/03/2007  . UTERINE PROLAPSE 04/06/2007  . OSTEOARTHRITIS 01/03/2007  . OSTEOPOROSIS 01/03/2007    hx of actonbel use for at least 5 years   . FRACTURE, ANKLE, LEFT 04/10/2009  . Blood transfusion 12 2010    hg 4 transfused 4 units  . Gastritis 2002  . DEPRESSION 01/03/2007    resolved  . Normal cardiac stress test     2014    Medications:  Scheduled:   Infusions:  . sodium chloride 1,000 mL (02/05/15 1918)   Followed by  . sodium chloride 500 mL (02/05/15 1857)  . vancomycin 1,000 mg (02/05/15 1853)   PRN:  Assessment: 74yoF with HLD, osteoporosis, and h/o repair of L ankle  fracture who noticed some redness, swelling and pain of left lower leg. Vancomycin and Zosyn to be started for cellulitis and r/o sepsis.  Goal of Therapy:  Vancomycin trough level= 15-20 mcg/ml  Appropriate antibiotic dosing for indication and renal function. Eradication of infection  Plan:  Zosyn 3.375 gm IV Q8h (4 hr infusion)  Vancomycin 500mg  IV Q12h (Vanco 1gm IV x1 in ED) F/u Scr and Blood/ Urine Cx   Garnet Sierras 02/05/2015,7:31 PM

## 2015-02-05 NOTE — H&P (Signed)
[facility] History and Physical  Patient: Belinda Harper  MRN: [id number]  DOB: 02/02/1941  DOS: the patient was seen and examined on 02/05/2015 PCP: [name], MD  Referring physician: Dr. Rex Kras Chief Complaint: Leg pain  HPI: Belinda Harper is a 73 y.o. female with Past medical history of iron deficiency anemia, chronic constipation. The patient is presenting with complaints of pain in her left leg. Patient mentions that since last one day she has been having increasing episodes of pain in her left leg. She also has noted some increased redness of the left leg. There is some mild swelling of the left ankle as well. She complains of feeling fatigue and tiredness. She felt that she was dehydrated with her next and with that she went to see her PCP who recommended her to go to ER. At the time of my evaluation patient denies any complaints of headache, dizziness, lightheadedness, cough, chest pain, shortness of breath, abdominal pain, nausea, vomiting, diarrhea, recent change in her medication. No recent travel no results letter no recent procedures.  The patient is coming from home.  At her baseline ambulates without support And is independent for most of her ADL; manages her medication on her own.  Review of Systems: as mentioned in the history of present illness.  A comprehensive review of the other systems is negative.  Past Medical History  Diagnosis Date  . Unspecified vitamin D deficiency 04/06/2007  . HYPERCHOLESTEROLEMIA 05/19/2008  . 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 HEMORRHOIDS 05/19/2008  . ALLERGIC RHINITIS 01/03/2007  . ASTHMA 01/03/2007  . UTERINE PROLAPSE 04/06/2007  . OSTEOARTHRITIS 01/03/2007  . OSTEOPOROSIS 01/03/2007    hx of actonbel use for at least 5 years   . FRACTURE, ANKLE, LEFT 04/10/2009  . Blood transfusion 12 2010    hg 4 transfused 4 units  . Gastritis 2002  . DEPRESSION  01/03/2007    resolved  . Normal cardiac stress test     2014   Past Surgical History  Procedure Laterality Date  . Panendoscopy      small bowel bx  . Ankle fracture surgery  02/2009    left ankle fracture and dislocation  . Cataract extraction w/ intraocular lens implant  2000    right eye  . Orif finger / thumb fracture  1970    reattached  . Mass excision Left 09/11/2012    Procedure: 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; Rotation Flap;  Surgeon: Wynonia Sours, MD;  Location: [facility];  Service: Orthopedics;  Laterality: Left;   Social History:  reports that she has never smoked. She has never used smokeless tobacco. She reports that she drinks alcohol. She reports that she does not use illicit drugs.  Allergies  Allergen Reactions  . Other Other (See Comments)    Bee sting - blood pressure dropped, passed out  . Sulfa Antibiotics Rash  . Sulfamethoxazole Rash    Family History  Problem Relation Age of Onset  . Hyperlipidemia Mother   . Heart disease Mother     CABG age 70  . Ovarian cancer Mother   . Uterine cancer Mother   . Arthritis Mother   . Osteoporosis Mother   . Heart disease Father     CABG age 82  . Arthritis Father   . Melanoma Father     Prior to Admission medications   Medication Sig Start Date End Date  Taking? Authorizing Provider  Calcium Citrate-Vitamin D (CITRACAL + D PO) Take 500 mg by mouth daily. Has 250IU of vitamin D3 and 80mg  of magnesium   Yes Historical Provider, MD  docusate sodium (COLACE) 100 MG capsule Take 100 mg by mouth daily.   Yes Historical Provider, MD  EPINEPHrine 0.3 mg/0.3 mL IJ SOAJ injection Inject 0.3 mLs (0.3 mg total) into the muscle once. 01/27/14  Yes Burnis Medin, MD  ferrous gluconate (FERGON) 240 (27 FE) MG tablet Take 1,440 mg by mouth at bedtime.   Yes Historical Provider, MD  Glucos-Chondroit-Hyaluron-MSM (GLUCOSAMINE CHONDROITIN JOINT) TABS Take 1 tablet by mouth 2 (two)  times daily.    Yes Historical Provider, MD  KERYDIN 5 % SOLN Apply 1 application topically daily. 12/23/14  Yes Historical Provider, MD  Menaquinone-7 (VITAMIN K2 PO) Take by mouth.   Yes Historical Provider, MD  Multiple Vitamins-Minerals (ONE-A-DAY WOMENS VITACRAVES PO) Take 1 tablet by mouth daily.   Yes Historical Provider, MD  vitamin C (ASCORBIC ACID) 500 MG tablet Take 500 mg by mouth daily.    Yes Historical Provider, MD    Physical Exam: Filed Vitals:   02/05/15 2000 02/05/15 2030 02/05/15 2045 02/05/15 2202  BP: 104/43 100/44  99/46  Pulse: 80 77 75 70  Temp:    98.9 F (37.2 C)  TempSrc:    Oral  Resp: 20 20 20 20   Weight:    70.4 kg (155 lb 3.3 oz)  SpO2: 94% 91% 96% 93%    General: Alert, Awake and Oriented to Time, Place and Person. Appear in mild distress Eyes: PERRL ENT: Oral Mucosa clear moist. Neck: no JVD Cardiovascular: S1 and S2 Present, no Murmur, Peripheral Pulses Present Respiratory: Bilateral Air entry equal and Decreased,  Clear to Auscultation, no Crackles, no wheezes Abdomen: Bowel Sound present, Soft and no tenderness Skin: Redness involving left ankle and forefoot Extremities: Trace Pedal edema, no calf tenderness Neurologic: Grossly no focal neuro deficit.  Labs on Admission:  CBC:  Recent Labs Lab 02/05/15 1835  WBC 13.4*  NEUTROABS 12.4*  HGB 11.6*  HCT 36.4  MCV 87.7  PLT 156    CMP     Component Value Date/Time   NA 135 02/05/2015 1835   NA 142 05/28/2012 1600   K 3.5 02/05/2015 1835   K 3.7 05/28/2012 1600   CL 100* 02/05/2015 1835   CL 103 05/28/2012 1600   CO2 26 02/05/2015 1835   CO2 29 05/28/2012 1600   GLUCOSE 124* 02/05/2015 1835   GLUCOSE 99 05/28/2012 1600   GLUCOSE 83 03/24/2006 0820   BUN 23* 02/05/2015 1835   BUN 19.3 05/28/2012 1600   CREATININE 0.86 02/05/2015 1835   CREATININE 0.8 05/28/2012 1600   CALCIUM 9.1 02/05/2015 1835   CALCIUM 9.5 05/28/2012 1600   CALCIUM 9.5 08/03/2011 1210   PROT 7.5  02/05/2015 1835   PROT 7.3 05/28/2012 1600   ALBUMIN 3.9 02/05/2015 1835   ALBUMIN 3.5 05/28/2012 1600   AST 32 02/05/2015 1835   AST 23 05/28/2012 1600   ALT 18 02/05/2015 1835   ALT 13 05/28/2012 1600   ALKPHOS 63 02/05/2015 1835   ALKPHOS 82 05/28/2012 1600   BILITOT 0.8 02/05/2015 1835   BILITOT <0.20 05/28/2012 1600   GFRNONAA >60 02/05/2015 1835   GFRAA >60 02/05/2015 1835     Recent Labs Lab 02/05/15 2237  CKTOTAL 96   BNP (last 3 results) No results for input(s): BNP in the last 8760 hours.  ProBNP (last 3 results) No results for input(s): PROBNP in the last 8760 hours.   Radiological Exams on Admission: Dg Tibia/fibula Left  02/05/2015  CLINICAL DATA:  74 year old female with lower extremity redness and fever. Cellulitis. Initial encounter. EXAM: LEFT TIBIA AND FIBULA - 2 VIEW COMPARISON:  Tib-fib series 11 10/12/2008. FINDINGS: No knee joint effusion identified. Chronic medial joint space loss and degenerative spurring at the knee. Patella appears intact. Distal femur and proximal tibia and fibula appear intact. Distal tibia and fibula ORIF hardware following the 2010 fractures. The proximal fibula head fracture is healed. Hardware appears intact without evidence of loosening. Distal tibia and fibula appear intact. Visible calcaneus intact. No subcutaneous gas. No other radiopaque foreign body identified. IMPRESSION: Posttraumatic and postoperative changes to the left tibia and fibula. No acute osseous abnormality identified. No subcutaneous gas. Electronically Signed   By: Genevie Ann M.D.   On: 02/05/2015 19:19   Assessment/Plan 1. Sepsis due to cellulitis [facility]) Patient presents with complaints of left leg pain and swelling. She has minimal swelling but she was significantly febrile also has leukocytosis and tachycardia with mild hypotension. Mentioned that she is meeting the criteria for SIRS. With a possible source of infection in her skin she is meeting the criteria  for sepsis. She will be treated with vancomycin for the sepsis secondary to cellulitis. She also has a mild evidence of UTI. With this I would continue the Zosyn at present as well. Next I will follow the culture And she will be given IV hydration. We'll monitor her on telemetry.  2. Iron deficiency anemia Continue iron supplementation.  3  Constipation Continue Colace. Monitor for constipation.  4  UTI (urinary tract infection) Add Zosyn  Nutrition: Regular diet DVT Prophylaxis: subcutaneous Heparin  Advance goals of care discussion: Full code   Disposition: Admitted as inpatient, telemetry unit.  Author: Berle Mull, MD [facility] Pager: [phone] 02/05/2015  If 7PM-7AM, please contact night-coverage [email or web address] Password [email or web address]

## 2015-02-05 NOTE — ED Notes (Signed)
Pt presents from PCP for left leg redness and fever. Pt noticed reddened area to left lower calf today. Warm to touch, pedal pulses intact, denies numbness or tingling.

## 2015-02-05 NOTE — Progress Notes (Signed)
Pre visit review using our clinic review tool, if applicable. No additional management support is needed unless otherwise documented below in the visit note. 

## 2015-02-06 LAB — HIV ANTIBODY (ROUTINE TESTING W REFLEX): HIV Screen 4th Generation wRfx: NONREACTIVE

## 2015-02-06 MED ORDER — CHLORHEXIDINE GLUCONATE 0.12 % MT SOLN
15.0000 mL | Freq: Two times a day (BID) | OROMUCOSAL | Status: DC
  Administered 2015-02-06 – 2015-02-09 (×5): 15 mL via OROMUCOSAL
  Filled 2015-02-06 (×14): qty 15

## 2015-02-06 MED ORDER — POLYETHYLENE GLYCOL 3350 17 G PO PACK
17.0000 g | PACK | Freq: Every day | ORAL | Status: DC
  Administered 2015-02-09 – 2015-02-10 (×2): 17 g via ORAL
  Filled 2015-02-06 (×8): qty 1

## 2015-02-06 MED ORDER — FERROUS GLUCONATE 324 (38 FE) MG PO TABS
324.0000 mg | ORAL_TABLET | Freq: Every day | ORAL | Status: DC
  Administered 2015-02-06 – 2015-02-11 (×6): 324 mg via ORAL
  Filled 2015-02-06 (×8): qty 1

## 2015-02-06 MED ORDER — CETYLPYRIDINIUM CHLORIDE 0.05 % MT LIQD
7.0000 mL | Freq: Two times a day (BID) | OROMUCOSAL | Status: DC
  Administered 2015-02-06 – 2015-02-10 (×6): 7 mL via OROMUCOSAL

## 2015-02-06 MED ORDER — DEXTROSE 5 % IV SOLN
1.0000 g | INTRAVENOUS | Status: DC
  Administered 2015-02-06 – 2015-02-07 (×2): 1 g via INTRAVENOUS
  Filled 2015-02-06 (×3): qty 10

## 2015-02-06 NOTE — Progress Notes (Signed)
[facility] PROGRESS NOTE    Progress Note   Belinda Harper CHE:[id number] DOB: [birth year] DOA: 02/12/15 PCP: [name], MD   Brief Narrative:   Belinda Harper is an 74 y.o. female who came into the hospital complaining of lower extremity left leg pain  Assessment/Plan:   Sepsis due to cellulitis ([place]) - She was empirically on IV vancomycin, I agree with Dr. Serita Grit plan. - Blood cultures are pending.    Iron deficiency anemia: - Continue iron supplementation.    Constipation  had MiraLAX to her regimen.  UTI (urinary tract infection) - De-escalate antibiotic coverage to Rocephin. Urine cultures are pending.    DVT Prophylaxis - Lovenox ordered.  Family Communication: none Disposition Plan: Home when stable. Code Status:     Code Status Orders        Start     Ordered   02-12-15 2115  Full code   Continuous     02-12-2015 2116        IV Access:    Peripheral IV   Procedures and diagnostic studies:   Dg Tibia/fibula Left  02-12-15  CLINICAL DATA:  74 year old female with lower extremity redness and fever. Cellulitis. Initial encounter. EXAM: LEFT TIBIA AND FIBULA - 2 VIEW COMPARISON:  Tib-fib series 11 10/12/2008. FINDINGS: No knee joint effusion identified. Chronic medial joint space loss and degenerative spurring at the knee. Patella appears intact. Distal femur and proximal tibia and fibula appear intact. Distal tibia and fibula ORIF hardware following the 2010 fractures. The proximal fibula head fracture is healed. Hardware appears intact without evidence of loosening. Distal tibia and fibula appear intact. Visible calcaneus intact. No subcutaneous gas. No other radiopaque foreign body identified. IMPRESSION: Posttraumatic and postoperative changes to the left tibia and fibula. No acute osseous abnormality identified. No subcutaneous gas. Electronically Signed   By: Genevie Ann M.D.   On: Feb 12, 2015 19:19     Medical Consultants:     None.  Anti-Infectives:   Anti-infectives    Start     Dose/Rate Route Frequency Ordered Stop   02/06/15 0800  vancomycin (VANCOCIN) 500 mg in sodium chloride 0.9 % 100 mL IVPB     500 mg 100 mL/hr over 60 Minutes Intravenous Every 12 hours 02-12-2015 1946     02/06/15 0200  piperacillin-tazobactam (ZOSYN) IVPB 3.375 g     3.375 g 12.5 mL/hr over 240 Minutes Intravenous Every 8 hours 12-Feb-2015 1945     Feb 12, 2015 1830  piperacillin-tazobactam (ZOSYN) IVPB 3.375 g     3.375 g 100 mL/hr over 30 Minutes Intravenous  Once 02-12-15 1826 Feb 12, 2015 1921   12-Feb-2015 1830  vancomycin (VANCOCIN) IVPB 1000 mg/200 mL premix     1,000 mg 200 mL/hr over 60 Minutes Intravenous  Once Feb 12, 2015 1826 Feb 12, 2015 2000      Subjective:    Belinda Harper she relates her leg is still painful.  Objective:    Filed Vitals:   02-12-2015 2030 02/12/15 2045 12-Feb-2015 2202 02/06/15 0538  BP: 100/44  99/46 115/41  Pulse: 77 75 70 83  Temp:   98.9 F (37.2 C) 98.9 F (37.2 C)  TempSrc:   Oral Oral  Resp: 20 20 20 20   Weight:   70.4 kg (155 lb 3.3 oz)   SpO2: 91% 96% 93% 95%    Intake/Output Summary (Last 24 hours) at 02/06/15 0838 Last data filed at 02/06/15 0700  Gross per 24 hour  Intake 3641.67 ml  Output  0 ml  Net 3641.67 ml   Filed Weights   02/05/15 2202  Weight: 70.4 kg (155 lb 3.3 oz)    Exam: Gen:  NAD Cardiovascular:  RRR, No M/R/G Chest and lungs:   CTAB Abdomen:  Abdomen soft, NT/ND, + BS Extremities:  No C/E/C Skin:Her left leg is erythematous wanted and tender to touch   Data Reviewed:    Labs: Basic Metabolic Panel:  Recent Labs Lab 02/05/15 1835  NA 135  K 3.5  CL 100*  CO2 26  GLUCOSE 124*  BUN 23*  CREATININE 0.86  CALCIUM 9.1   GFR Estimated Creatinine Clearance: 56.7 mL/min (by C-G formula based on Cr of 0.86). Liver Function Tests:  Recent Labs Lab 02/05/15 1835  AST 32  ALT 18  ALKPHOS 63  BILITOT 0.8  PROT 7.5  ALBUMIN 3.9   No  results for input(s): LIPASE, AMYLASE in the last 168 hours. No results for input(s): AMMONIA in the last 168 hours. Coagulation profile No results for input(s): INR, PROTIME in the last 168 hours.  CBC:  Recent Labs Lab 02/05/15 1835  WBC 13.4*  NEUTROABS 12.4*  HGB 11.6*  HCT 36.4  MCV 87.7  PLT 156   Cardiac Enzymes:  Recent Labs Lab 02/05/15 2237  CKTOTAL 96   BNP (last 3 results) No results for input(s): PROBNP in the last 8760 hours. CBG: No results for input(s): GLUCAP in the last 168 hours. D-Dimer: No results for input(s): DDIMER in the last 72 hours. Hgb A1c: No results for input(s): HGBA1C in the last 72 hours. Lipid Profile: No results for input(s): CHOL, HDL, LDLCALC, TRIG, CHOLHDL, LDLDIRECT in the last 72 hours. Thyroid function studies: No results for input(s): TSH, T4TOTAL, T3FREE, THYROIDAB in the last 72 hours.  Invalid input(s): FREET3 Anemia work up: No results for input(s): VITAMINB12, FOLATE, FERRITIN, TIBC, IRON, RETICCTPCT in the last 72 hours. Sepsis Labs:  Recent Labs Lab 02/05/15 1835 02/05/15 1846 02/05/15 2237  WBC 13.4*  --   --   LATICACIDVEN  --  1.19 0.8   Microbiology Recent Results (from the past 240 hour(s))  Urine culture     Status: None (Preliminary result)   Collection Time: 02/05/15  6:32 PM  Result Value Ref Range Status   Specimen Description URINE, CLEAN CATCH  Final   Special Requests NONE  Final   Culture   Final    CULTURE REINCUBATED FOR BETTER GROWTH Performed at [facility]    Report Status PENDING  Incomplete     Medications:   . antiseptic oral rinse  7 mL Mouth Rinse q12n4p  . chlorhexidine  15 mL Mouth Rinse BID  . docusate sodium  100 mg Oral Daily  . enoxaparin (LOVENOX) injection  40 mg Subcutaneous Q24H  . ferrous gluconate  324 mg Oral QHS  . piperacillin-tazobactam (ZOSYN)  IV  3.375 g Intravenous Q8H  . sodium chloride  3 mL Intravenous Q12H  . vancomycin  500 mg Intravenous  Q12H   Continuous Infusions: . sodium chloride 125 mL/hr at 02/05/15 2152    Time spent: 25 min   LOS: 1 day   Charlynne Cousins  [facility] Pager [phone]  *Please refer to [email or web address], password [email or web address] to get updated schedule on who will round on this patient, as hospitalists switch teams weekly. If 7PM-7AM, please contact night-coverage at [email or web address], password [email or web address] for any overnight needs.  02/06/2015, 8:38 AM

## 2015-02-06 NOTE — Progress Notes (Signed)
Value Min Max    Temp 98.9 F (37.2 C) 102.7 F (39.3 C)   Pulse Rate 70 112   ECG Heart Rate 68 112   Resp 16 20   BP: Systolic 99 mmHg 142 mmHg   BP: Diastolic 41 mmHg 92 mmHg   SpO2 91 % 99 %     Date: February 06, 2015 Chart reviewed for concurrent status and case management needs. Will continue to follow patient for changes and needs: Velva Harman, RN, BSN, [place]   [phone]

## 2015-02-06 NOTE — Progress Notes (Signed)
Nutrition Brief Note  Patient identified on the Malnutrition Screening Tool (MST) Report  Wt Readings from Last 15 Encounters:  02/05/15 155 lb 3.3 oz (70.4 kg)  02/05/15 151 lb 12.8 oz (68.856 kg)  10/01/14 150 lb 11.2 oz (68.357 kg)  05/20/14 156 lb (70.761 kg)  12/18/13 155 lb (70.308 kg)  12/04/13 152 lb 6.4 oz (69.128 kg)  05/29/13 157 lb 9.6 oz (71.487 kg)  11/27/12 154 lb 9.6 oz (70.126 kg)  11/21/12 157 lb (71.215 kg)  10/18/12 151 lb (68.493 kg)  10/11/12 151 lb 12.8 oz (68.856 kg)  09/27/12 152 lb (68.947 kg)  09/19/12 154 lb (69.854 kg)  09/10/12 152 lb (68.947 kg)  08/21/12 156 lb (70.761 kg)    Body mass index is 24.3 kg/(m^2). Patient meets criteria for normal weight based on current BMI.   Pt seen for MST. She states that she ate oatmeal and a half of an orange for breakfast this AM. She states that for breakfast she typically has Grape Nuts mixed with Kashi cereal and fruit. Pt reports very good appetite at home stating "I am like a vacuum cleaner." She states that over the summer months she lost 5-7 lbs due to working in her yard more frequently and being more physically active. As the weather has gotten cold she has been eating more and states she has gained a few pounds back.  Current diet order is Regular, patient is consuming approximately 75% of meals at this time. Labs and medications reviewed.   No nutrition interventions warranted at this time. If nutrition issues arise, please consult RD.      Jarome Matin, RD, LDN Inpatient Clinical Dietitian Pager # [phone] After hours/weekend pager # [phone]

## 2015-02-07 LAB — URINE CULTURE: Culture: >=100000

## 2015-02-07 MED ORDER — ALPRAZOLAM ER 0.5 MG PO TB24
0.5000 mg | ORAL_TABLET | Freq: Once | ORAL | Status: AC
  Administered 2015-02-07: 0.5 mg via ORAL
  Filled 2015-02-07: qty 1

## 2015-02-07 NOTE — Progress Notes (Signed)
[facility] PROGRESS NOTE    Progress Note   LASHENA SIGNER EGB:[id number] DOB: [birth year] DOA: 03-Mar-2015 PCP: [name], MD   Brief Narrative:   Belinda Harper is an 74 y.o. female who came into the hospital complaining of lower extremity left leg pain  Assessment/Plan:   Sepsis due to cellulitis ([place]) - She was empirically on IV vancomycin and Rocephin, the legs today looks rather but the erythema has not spread beyond the demarcated line. - Blood cultures are negative.  Iron deficiency anemia: - Continue iron supplementation.  Constipation  Had MiraLAX to her regimen.  UTI (urinary tract infection) - De-escalate antibiotic coverage to Rocephin. Urine cultures are pending.    DVT Prophylaxis - Lovenox ordered.  Family Communication: none Disposition Plan: Home when stable. Code Status:     Code Status Orders        Start     Ordered   2015-03-03 2115  Full code   Continuous     03-03-2015 2116        IV Access:    Peripheral IV   Procedures and diagnostic studies:   Dg Tibia/fibula Left  2015-03-03  CLINICAL DATA:  74 year old female with lower extremity redness and fever. Cellulitis. Initial encounter. EXAM: LEFT TIBIA AND FIBULA - 2 VIEW COMPARISON:  Tib-fib series 11 10/12/2008. FINDINGS: No knee joint effusion identified. Chronic medial joint space loss and degenerative spurring at the knee. Patella appears intact. Distal femur and proximal tibia and fibula appear intact. Distal tibia and fibula ORIF hardware following the 2010 fractures. The proximal fibula head fracture is healed. Hardware appears intact without evidence of loosening. Distal tibia and fibula appear intact. Visible calcaneus intact. No subcutaneous gas. No other radiopaque foreign body identified. IMPRESSION: Posttraumatic and postoperative changes to the left tibia and fibula. No acute osseous abnormality identified. No subcutaneous gas. Electronically Signed   By: Genevie Ann M.D.   On: March 03, 2015 19:19     Medical Consultants:    None.  Anti-Infectives:   Anti-infectives    Start     Dose/Rate Route Frequency Ordered Stop   02/06/15 1000  cefTRIAXone (ROCEPHIN) 1 g in dextrose 5 % 50 mL IVPB     1 g 100 mL/hr over 30 Minutes Intravenous Every 24 hours 02/06/15 0839     02/06/15 0800  vancomycin (VANCOCIN) 500 mg in sodium chloride 0.9 % 100 mL IVPB     500 mg 100 mL/hr over 60 Minutes Intravenous Every 12 hours 2015-03-03 1946     02/06/15 0200  piperacillin-tazobactam (ZOSYN) IVPB 3.375 g  Status:  Discontinued     3.375 g 12.5 mL/hr over 240 Minutes Intravenous Every 8 hours Mar 03, 2015 1945 02/06/15 0839   March 03, 2015 1830  piperacillin-tazobactam (ZOSYN) IVPB 3.375 g     3.375 g 100 mL/hr over 30 Minutes Intravenous  Once 2015/03/03 1826 03-03-2015 1921   03-03-15 1830  vancomycin (VANCOCIN) IVPB 1000 mg/200 mL premix     1,000 mg 200 mL/hr over 60 Minutes Intravenous  Once March 03, 2015 1826 2015-03-03 2000      Subjective:    Curley Spice she relates her leg is still painful, her leg is swelling.  Objective:    Filed Vitals:   02/06/15 1010 02/06/15 1342 02/06/15 2134 02/07/15 0559  BP: 118/41 130/57 135/60 117/51  Pulse: 77 81 80 69  Temp: 99.8 F (37.7 C) 99.3 F (37.4 C) 99.5 F (37.5 C) 98.9 F (37.2 C)  TempSrc: Oral Oral Oral Oral  Resp: 18 18 18 18   Weight:      SpO2: 99% 97% 94% 96%    Intake/Output Summary (Last 24 hours) at 02/07/15 0827 Last data filed at 02/07/15 0500  Gross per 24 hour  Intake    240 ml  Output   1200 ml  Net   -960 ml   Filed Weights   02/05/15 2202  Weight: 70.4 kg (155 lb 3.3 oz)    Exam: Gen:  NAD Cardiovascular:  RRR, No M/R/G Chest and lungs:   CTAB Abdomen:  Abdomen soft, NT/ND, + BS Extremities:  No C/E/C Skin:Her left leg is erythematous wanted and tender to touch   Data Reviewed:    Labs: Basic Metabolic Panel:  Recent Labs Lab 02/05/15 1835  NA 135  K 3.5  CL 100*    CO2 26  GLUCOSE 124*  BUN 23*  CREATININE 0.86  CALCIUM 9.1   GFR Estimated Creatinine Clearance: 56.7 mL/min (by C-G formula based on Cr of 0.86). Liver Function Tests:  Recent Labs Lab 02/05/15 1835  AST 32  ALT 18  ALKPHOS 63  BILITOT 0.8  PROT 7.5  ALBUMIN 3.9   No results for input(s): LIPASE, AMYLASE in the last 168 hours. No results for input(s): AMMONIA in the last 168 hours. Coagulation profile No results for input(s): INR, PROTIME in the last 168 hours.  CBC:  Recent Labs Lab 02/05/15 1835  WBC 13.4*  NEUTROABS 12.4*  HGB 11.6*  HCT 36.4  MCV 87.7  PLT 156   Cardiac Enzymes:  Recent Labs Lab 02/05/15 2237  CKTOTAL 96   BNP (last 3 results) No results for input(s): PROBNP in the last 8760 hours. CBG: No results for input(s): GLUCAP in the last 168 hours. D-Dimer: No results for input(s): DDIMER in the last 72 hours. Hgb A1c: No results for input(s): HGBA1C in the last 72 hours. Lipid Profile: No results for input(s): CHOL, HDL, LDLCALC, TRIG, CHOLHDL, LDLDIRECT in the last 72 hours. Thyroid function studies: No results for input(s): TSH, T4TOTAL, T3FREE, THYROIDAB in the last 72 hours.  Invalid input(s): FREET3 Anemia work up: No results for input(s): VITAMINB12, FOLATE, FERRITIN, TIBC, IRON, RETICCTPCT in the last 72 hours. Sepsis Labs:  Recent Labs Lab 02/05/15 1835 02/05/15 1846 02/05/15 2237  WBC 13.4*  --   --   LATICACIDVEN  --  1.19 0.8   Microbiology Recent Results (from the past 240 hour(s))  Blood Culture (routine x 2)     Status: None (Preliminary result)   Collection Time: 02/05/15  5:30 PM  Result Value Ref Range Status   Specimen Description BLOOD LAC  Final   Special Requests BOTTLES DRAWN AEROBIC AND ANAEROBIC 7 CC  Final   Culture   Final    NO GROWTH < 24 HOURS Performed at [facility]    Report Status PENDING  Incomplete  Blood Culture (routine x 2)     Status: None (Preliminary result)    Collection Time: 02/05/15  5:30 PM  Result Value Ref Range Status   Specimen Description BLOOD RIGHT ANTECUBITAL  Final   Special Requests BOTTLES DRAWN AEROBIC AND ANAEROBIC 5 CC  Final   Culture   Final    NO GROWTH < 24 HOURS Performed at [facility]    Report Status PENDING  Incomplete  Urine culture     Status: None (Preliminary result)   Collection Time: 02/05/15  6:32 PM  Result Value Ref Range Status   Specimen Description  URINE, CLEAN CATCH  Final   Special Requests NONE  Final   Culture   Final    >=100,000 COLONIES/mL ESCHERICHIA COLI Performed at [facility]    Report Status PENDING  Incomplete     Medications:   . antiseptic oral rinse  7 mL Mouth Rinse q12n4p  . cefTRIAXone (ROCEPHIN)  IV  1 g Intravenous Q24H  . chlorhexidine  15 mL Mouth Rinse BID  . docusate sodium  100 mg Oral Daily  . enoxaparin (LOVENOX) injection  40 mg Subcutaneous Q24H  . ferrous gluconate  324 mg Oral QHS  . polyethylene glycol  17 g Oral Daily  . sodium chloride  3 mL Intravenous Q12H  . vancomycin  500 mg Intravenous Q12H   Continuous Infusions: . sodium chloride 125 mL/hr at 02/05/15 2152    Time spent: 25 min   LOS: 2 days   Charlynne Cousins  [facility] Pager [phone]  *Please refer to [email or web address], password [email or web address] to get updated schedule on who will round on this patient, as hospitalists switch teams weekly. If 7PM-7AM, please contact night-coverage at [email or web address], password [email or web address] for any overnight needs.  02/07/2015, 8:27 AM

## 2015-02-08 ENCOUNTER — Inpatient Hospital Stay (HOSPITAL_COMMUNITY): Payer: Medicare Other

## 2015-02-08 DIAGNOSIS — E8771 Transfusion associated circulatory overload: Secondary | ICD-10-CM

## 2015-02-08 LAB — BASIC METABOLIC PANEL
Anion gap: 6 (ref 5–15)
BUN: 9 mg/dL (ref 6–20)
CO2: 26 mmol/L (ref 22–32)
Calcium: 7.8 mg/dL — ABNORMAL LOW (ref 8.9–10.3)
Chloride: 107 mmol/L (ref 101–111)
Creatinine, Ser: 0.65 mg/dL (ref 0.44–1.00)
GFR calc Af Amer: >60 mL/min (ref >60)
GFR calc non Af Amer: >60 mL/min (ref >60)
Glucose, Bld: 106 mg/dL — ABNORMAL HIGH (ref 65–99)
Potassium: 3.2 mmol/L — ABNORMAL LOW (ref 3.5–5.1)
Sodium: 139 mmol/L (ref 135–145)

## 2015-02-08 LAB — CBC
HCT: 32.9 % — ABNORMAL LOW (ref 36.0–46.0)
Hemoglobin: 10 g/dL — ABNORMAL LOW (ref 12.0–15.0)
MCH: 27.2 pg (ref 26.0–34.0)
MCHC: 30.4 g/dL (ref 30.0–36.0)
MCV: 89.6 fL (ref 78.0–100.0)
Platelets: 128 10*3/uL — ABNORMAL LOW (ref 150–400)
RBC: 3.67 MIL/uL — ABNORMAL LOW (ref 3.87–5.11)
RDW: 16.6 % — ABNORMAL HIGH (ref 11.5–15.5)
WBC: 7.5 10*3/uL (ref 4.0–10.5)

## 2015-02-08 LAB — VANCOMYCIN, TROUGH: Vancomycin Tr: 7 ug/mL — ABNORMAL LOW (ref 10.0–20.0)

## 2015-02-08 LAB — BRAIN NATRIURETIC PEPTIDE: B Natriuretic Peptide: 422.4 pg/mL — ABNORMAL HIGH (ref 0.0–100.0)

## 2015-02-08 MED ORDER — VANCOMYCIN HCL IN DEXTROSE 750-5 MG/150ML-% IV SOLN
750.0000 mg | Freq: Two times a day (BID) | INTRAVENOUS | Status: DC
  Administered 2015-02-08 – 2015-02-11 (×6): 750 mg via INTRAVENOUS
  Filled 2015-02-08 (×6): qty 150

## 2015-02-08 MED ORDER — ALPRAZOLAM ER 0.5 MG PO TB24
0.5000 mg | ORAL_TABLET | Freq: Three times a day (TID) | ORAL | Status: DC | PRN
  Administered 2015-02-08: 0.5 mg via ORAL
  Filled 2015-02-08: qty 1

## 2015-02-08 MED ORDER — VANCOMYCIN HCL IN DEXTROSE 750-5 MG/150ML-% IV SOLN: 750.0000 mg | Freq: Two times a day (BID) | INTRAVENOUS | Status: DC

## 2015-02-08 MED ORDER — POTASSIUM CHLORIDE CRYS ER 20 MEQ PO TBCR
40.0000 meq | EXTENDED_RELEASE_TABLET | Freq: Two times a day (BID) | ORAL | Status: AC
  Administered 2015-02-08 (×2): 40 meq via ORAL
  Filled 2015-02-08 (×2): qty 2

## 2015-02-08 MED ORDER — DOXYCYCLINE HYCLATE 100 MG PO TABS
100.0000 mg | ORAL_TABLET | Freq: Two times a day (BID) | ORAL | Status: DC
  Filled 2015-02-08 (×2): qty 1

## 2015-02-08 MED ORDER — FUROSEMIDE 10 MG/ML IJ SOLN
INTRAMUSCULAR | Status: AC
  Filled 2015-02-08: qty 4

## 2015-02-08 MED ORDER — FUROSEMIDE 10 MG/ML IJ SOLN
INTRAMUSCULAR | Status: AC
  Administered 2015-02-08: 10 mg
  Filled 2015-02-08: qty 2

## 2015-02-08 MED ORDER — AMOXICILLIN 500 MG PO CAPS
500.0000 mg | ORAL_CAPSULE | Freq: Three times a day (TID) | ORAL | Status: DC
  Administered 2015-02-08 – 2015-02-09 (×3): 500 mg via ORAL
  Filled 2015-02-08 (×8): qty 1

## 2015-02-08 MED ORDER — FUROSEMIDE 10 MG/ML IJ SOLN
40.0000 mg | Freq: Once | INTRAMUSCULAR | Status: AC
  Administered 2015-02-08: 40 mg via INTRAVENOUS

## 2015-02-08 NOTE — Progress Notes (Signed)
ANTIBIOTIC CONSULT NOTE - FOLLOW UP  Pharmacy Consult for Vancomycin Indication: Cellulitis  Allergies  Allergen Reactions  . Other Other (See Comments)    Bee sting - blood pressure dropped, passed out  . Sulfa Antibiotics Rash  . Sulfamethoxazole Rash    Patient Measurements: Weight: 155 lb 3.3 oz (70.4 kg)  Vital Signs: Temp: 99.6 F (37.6 C) (10/16 0523) Temp Source: Oral (10/16 0523) BP: 145/55 mmHg (10/16 0523) Pulse Rate: 106 (10/16 0523) Intake/Output from previous day: 10/15 0701 - 10/16 0700 In: 8832 [P.O.:120; I.V.:1500] Out: 1200 [Urine:1200]  Labs:  Recent Labs  02/05/15 1835 02/08/15 0734  WBC 13.4* 7.5  HGB 11.6* 10.0*  PLT 156 128*  CREATININE 0.86  --    Estimated Creatinine Clearance: 56.7 mL/min (by C-G formula based on Cr of 0.86).  Recent Labs  02/08/15 0734  VANCOTROUGH 7*    Assessment: 19 yoF presented 10/13 from PCP with CC left leg redness and fever to start vancomycin and Zosyn per pharmacy for cellulitis and rule out sepsis.  Zosyn was narrowed to ceftriaxone per MD dosing for [facility] UTI.  Today, 02/08/2015: Tmax 99.6 WBC improved to WNL SCr 0.65 with Crcl ~ 60 ml/min Vancomycin trough level = 7, subtherapeutic  Goal of Therapy:  Vancomycin trough level 10-15 mcg/ml  Plan:  Increase to Vancomycin 750 mg IV q12h. Measure Vanc trough at steady state. Follow up renal fxn, culture results, and clinical course.  Gretta Arab PharmD, BCPS Pager [phone] 02/08/2015 8:15 AM

## 2015-02-08 NOTE — Progress Notes (Signed)
[facility] PROGRESS NOTE    Progress Note   IZZABELL KLASEN Harper:[id number] DOB: [birth year] DOA: 02/05/2015 PCP: Belinda Dawson, MD   Brief Narrative:   Belinda Harper is an 74 y.o. female who came into the hospital complaining of lower extremity left leg pain  Assessment/Plan:   Sepsis due to cellulitis ([place]) - She was empirically on IV vancomycin, leg continues to be swollen and erythematous him want to touch. - Blood cultures are negative.  Volume Overload: - Likely due to IV fluid. - +JVD, crackles at base bilaterally. - Check CXR, start IV lasix.  Iron deficiency anemia: - Continue iron supplementation.  Constipation  Had MiraLAX to her regimen.  UTI (urinary tract infection) - U.C. grew E. Coli sensitive to PCN. Cont Abx for 3 additional days.    DVT Prophylaxis - Lovenox ordered.  Family Communication: none Disposition Plan: Home 2-3 days Code Status:     Code Status Orders        Start     Ordered   02/05/15 2115  Full code   Continuous     02/05/15 2116        IV Access:    Peripheral IV   Procedures and diagnostic studies:   No results found.   Medical Consultants:    None.  Anti-Infectives:   Anti-infectives    Start     Dose/Rate Route Frequency Ordered Stop   02/08/15 1800  vancomycin (VANCOCIN) IVPB 750 mg/150 ml premix  Status:  Discontinued     750 mg 150 mL/hr over 60 Minutes Intravenous Every 12 hours 02/08/15 0851 02/08/15 0948   02/08/15 1400  amoxicillin (AMOXIL) capsule 500 mg     500 mg Oral 3 times per day 02/08/15 0948     02/08/15 1000  doxycycline (VIBRA-TABS) tablet 100 mg  Status:  Discontinued     100 mg Oral Every 12 hours 02/08/15 0948 02/08/15 0956   02/06/15 1000  cefTRIAXone (ROCEPHIN) 1 g in dextrose 5 % 50 mL IVPB  Status:  Discontinued     1 g 100 mL/hr over 30 Minutes Intravenous Every 24 hours 02/06/15 0839 02/08/15 0948   02/06/15 0800  vancomycin (VANCOCIN) 500 mg in sodium chloride  0.9 % 100 mL IVPB  Status:  Discontinued     500 mg 100 mL/hr over 60 Minutes Intravenous Every 12 hours 02/05/15 1946 02/08/15 0851   02/06/15 0200  piperacillin-tazobactam (ZOSYN) IVPB 3.375 g  Status:  Discontinued     3.375 g 12.5 mL/hr over 240 Minutes Intravenous Every 8 hours 02/05/15 1945 02/06/15 0839   02/05/15 1830  piperacillin-tazobactam (ZOSYN) IVPB 3.375 g     3.375 g 100 mL/hr over 30 Minutes Intravenous  Once 02/05/15 1826 02/05/15 1921   02/05/15 1830  vancomycin (VANCOCIN) IVPB 1000 mg/200 mL premix     1,000 mg 200 mL/hr over 60 Minutes Intravenous  Once 02/05/15 1826 02/05/15 2000      Subjective:    Curley Spice she is SOB today, leg still pain full.  Objective:    Filed Vitals:   02/07/15 1857 02/07/15 2106 02/08/15 0500 02/08/15 0523  BP:  120/44  145/55  Pulse:  79  106  Temp:  99.5 F (37.5 C)  99.6 F (37.6 C)  TempSrc:  Oral  Oral  Resp:  18  18  Weight:   70.4 kg (155 lb 3.3 oz)   SpO2: 93% 92%  90%    Intake/Output Summary (Last 24 hours)  at 02/08/15 1005 Last data filed at 02/08/15 0256  Gross per 24 hour  Intake   1620 ml  Output   1200 ml  Net    420 ml   Filed Weights   02/05/15 2202 02/08/15 0500  Weight: 70.4 kg (155 lb 3.3 oz) 70.4 kg (155 lb 3.3 oz)    Exam: Gen:  NAD Cardiovascular:  RRR, No M/R/G Chest and lungs:   CTAB Abdomen:  Abdomen soft, NT/ND, + BS Extremities:  No C/E/C Skin:Her left leg is erythematous wanted and tender to touch   Data Reviewed:    Labs: Basic Metabolic Panel:  Recent Labs Lab 02/05/15 1835 02/08/15 0734  NA 135 139  K 3.5 3.2*  CL 100* 107  CO2 26 26  GLUCOSE 124* 106*  BUN 23* 9  CREATININE 0.86 0.65  CALCIUM 9.1 7.8*   GFR Estimated Creatinine Clearance: 60.9 mL/min (by C-G formula based on Cr of 0.65). Liver Function Tests:  Recent Labs Lab 02/05/15 1835  AST 32  ALT 18  ALKPHOS 63  BILITOT 0.8  PROT 7.5  ALBUMIN 3.9   No results for input(s): LIPASE,  AMYLASE in the last 168 hours. No results for input(s): AMMONIA in the last 168 hours. Coagulation profile No results for input(s): INR, PROTIME in the last 168 hours.  CBC:  Recent Labs Lab 02/05/15 1835 02/08/15 0734  WBC 13.4* 7.5  NEUTROABS 12.4*  --   HGB 11.6* 10.0*  HCT 36.4 32.9*  MCV 87.7 89.6  PLT 156 128*   Cardiac Enzymes:  Recent Labs Lab 02/05/15 2237  CKTOTAL 96   BNP (last 3 results) No results for input(s): PROBNP in the last 8760 hours. CBG: No results for input(s): GLUCAP in the last 168 hours. D-Dimer: No results for input(s): DDIMER in the last 72 hours. Hgb A1c: No results for input(s): HGBA1C in the last 72 hours. Lipid Profile: No results for input(s): CHOL, HDL, LDLCALC, TRIG, CHOLHDL, LDLDIRECT in the last 72 hours. Thyroid function studies: No results for input(s): TSH, T4TOTAL, T3FREE, THYROIDAB in the last 72 hours.  Invalid input(s): FREET3 Anemia work up: No results for input(s): VITAMINB12, FOLATE, FERRITIN, TIBC, IRON, RETICCTPCT in the last 72 hours. Sepsis Labs:  Recent Labs Lab 02/05/15 1835 02/05/15 1846 02/05/15 2237 02/08/15 0734  WBC 13.4*  --   --  7.5  LATICACIDVEN  --  1.19 0.8  --    Microbiology Recent Results (from the past 240 hour(s))  Blood Culture (routine x 2)     Status: None (Preliminary result)   Collection Time: 02/05/15  5:30 PM  Result Value Ref Range Status   Specimen Description BLOOD LAC  Final   Special Requests BOTTLES DRAWN AEROBIC AND ANAEROBIC 7 CC  Final   Culture   Final    NO GROWTH 2 DAYS Performed at [facility]    Report Status PENDING  Incomplete  Blood Culture (routine x 2)     Status: None (Preliminary result)   Collection Time: 02/05/15  5:30 PM  Result Value Ref Range Status   Specimen Description BLOOD RIGHT ANTECUBITAL  Final   Special Requests BOTTLES DRAWN AEROBIC AND ANAEROBIC 5 CC  Final   Culture   Final    NO GROWTH 2 DAYS Performed at [facility]    Report Status PENDING  Incomplete  Urine culture     Status: None   Collection Time: 02/05/15  6:32 PM  Result Value Ref Range Status  Specimen Description URINE, CLEAN CATCH  Final   Special Requests NONE  Final   Culture   Final    >=100,000 COLONIES/mL ESCHERICHIA COLI Performed at [facility]    Report Status 02/07/2015 FINAL  Final   Organism ID, Bacteria ESCHERICHIA COLI  Final      Susceptibility   Escherichia coli - MIC*    AMPICILLIN 8 SENSITIVE Sensitive     CEFAZOLIN <=4 SENSITIVE Sensitive     CEFTRIAXONE <=1 SENSITIVE Sensitive     CIPROFLOXACIN >=4 RESISTANT Resistant     GENTAMICIN <=1 SENSITIVE Sensitive     IMIPENEM <=0.25 SENSITIVE Sensitive     NITROFURANTOIN <=16 SENSITIVE Sensitive     TRIMETH/SULFA <=20 SENSITIVE Sensitive     AMPICILLIN/SULBACTAM 4 SENSITIVE Sensitive     PIP/TAZO <=4 SENSITIVE Sensitive     * >=100,000 COLONIES/mL ESCHERICHIA COLI     Medications:   . amoxicillin  500 mg Oral 3 times per day  . antiseptic oral rinse  7 mL Mouth Rinse q12n4p  . chlorhexidine  15 mL Mouth Rinse BID  . docusate sodium  100 mg Oral Daily  . enoxaparin (LOVENOX) injection  40 mg Subcutaneous Q24H  . ferrous gluconate  324 mg Oral QHS  . furosemide      . polyethylene glycol  17 g Oral Daily  . potassium chloride  40 mEq Oral BID  . sodium chloride  3 mL Intravenous Q12H   Continuous Infusions:    Time spent: 25 min   LOS: 3 days   Charlynne Cousins  [facility] Pager [phone]  *Please refer to [email or web address], password [email or web address] to get updated schedule on who will round on this patient, as hospitalists switch teams weekly. If 7PM-7AM, please contact night-coverage at [email or web address], password [email or web address] for any overnight needs.  02/08/2015, 10:05 AM

## 2015-02-09 ENCOUNTER — Inpatient Hospital Stay (HOSPITAL_COMMUNITY): Payer: Medicare Other

## 2015-02-09 DIAGNOSIS — R0600 Dyspnea, unspecified: Secondary | ICD-10-CM

## 2015-02-09 DIAGNOSIS — K5909 Other constipation: Secondary | ICD-10-CM

## 2015-02-09 LAB — BASIC METABOLIC PANEL
Anion gap: 5 (ref 5–15)
BUN: 10 mg/dL (ref 6–20)
CO2: 32 mmol/L (ref 22–32)
Calcium: 8.7 mg/dL — ABNORMAL LOW (ref 8.9–10.3)
Chloride: 103 mmol/L (ref 101–111)
Creatinine, Ser: 0.62 mg/dL (ref 0.44–1.00)
GFR calc Af Amer: >60 mL/min (ref >60)
GFR calc non Af Amer: >60 mL/min (ref >60)
Glucose, Bld: 122 mg/dL — ABNORMAL HIGH (ref 65–99)
Potassium: 3.5 mmol/L (ref 3.5–5.1)
Sodium: 140 mmol/L (ref 135–145)

## 2015-02-09 MED ORDER — AMOXICILLIN 500 MG PO CAPS
500.0000 mg | ORAL_CAPSULE | Freq: Three times a day (TID) | ORAL | Status: AC
  Administered 2015-02-09 – 2015-02-11 (×6): 500 mg via ORAL
  Filled 2015-02-09 (×6): qty 1

## 2015-02-09 NOTE — Care Management Important Message (Signed)
Important Message  Patient Details  Name: FLORELLA MCNEESE MRN: [id number] Date of Birth: 24-Feb-1941   Medicare Important Message Given:  Yes-second notification given    Camillo Flaming 02/09/2015, 12:38 Benjamin Perez Message  Patient Details  Name: WYNELLE DREIER MRN: [id number] Date of Birth: [birth year]   Medicare Important Message Given:  Yes-second notification given    Camillo Flaming 02/09/2015, 12:38 PM

## 2015-02-09 NOTE — Progress Notes (Signed)
*  PRELIMINARY RESULTS* Echocardiogram 2D Echocardiogram has been performed.  Belinda Harper 02/09/2015, 4:11 PM

## 2015-02-09 NOTE — Care Management Note (Signed)
Case Management Note  Patient Details  Name: TEIGAN MANNER MRN: [id number] Date of Birth: 27-Nov-1940  Subjective/Objective:       Cellulitis of left lower leg, pna and uti             Action/Plan:Date: February 09, 2015 Chart reviewed for concurrent status and case management needs. Will continue to follow patient for changes and needs: Velva Harman, RN, BSN, [place]   [phone]   Expected Discharge Date:   ([place])               Expected Discharge Plan:  Home/Self Care  In-House Referral:  NA  Discharge planning Services  CM Consult  Post Acute Care Choice:  NA Choice offered to:  NA  DME Arranged:  N/A DME Agency:  NA  HH Arranged:  NA HH Agency:  NA  Status of Service:  In process, will continue to follow  Medicare Important Message Given:    Date Medicare IM Given:    Medicare IM give by:    Date Additional Medicare IM Given:    Additional Medicare Important Message give by:     If discussed at [place] of Stay Meetings, dates discussed:    Additional Comments:  Leeroy Cha, RN 02/09/2015, 10:27 AM

## 2015-02-09 NOTE — Progress Notes (Signed)
SATURATION QUALIFICATIONS: (This note is used to comply with regulatory documentation for home oxygen)  Patient Saturations on Room Air at Rest = 91%  Patient Saturations on Room Air while Ambulating = 82%  Patient Saturations on 2 Liters of oxygen while Ambulating = 96%  Please briefly explain why patient needs home oxygen: Patient does qualify for home oxygen use, patient oxygen levels decreased on RA.

## 2015-02-09 NOTE — Clinical Documentation Improvement (Signed)
Internal Medicine  Abnormal Lab/Test Results:  02/08/15: K= 3.2  Possible Clinical Conditions associated with below indicators  Hypokalemia  Other Condition  Cannot Clinically Determine   Supporting Information: 02/09/15: K= 3.5  Treatment Provided: KCL 40 mEq po BID   Please exercise your independent, professional judgment when responding. A specific answer is not anticipated or expected.   Thank You,  Rolm Gala, RN, [place] [phone]

## 2015-02-09 NOTE — Progress Notes (Signed)
[facility] PROGRESS NOTE    Progress Note   Belinda Harper MVE:[id number] DOB: November 29, 1940 DOA: 02/05/2015 PCP: [name], MD   Brief Narrative:   Belinda Harper is an 74 y.o. female who came into the hospital complaining of lower extremity left leg pain  Assessment/Plan:   Sepsis due to cellulitis ([place]) - She was empirically on IV vancomycin, leg continues to be swollen and erythematous him warm to touch. Nontender she is able to walk on the leg without pain. - Blood cultures are negative.  Volume Overload: - Likely due to IV fluid. She had a stress test on 09/29/2014 that showed an EF of 60% without evidence of ischemia. We'll check a 2-D echo. - -JVD, crackles at base bilaterally. - Started on IV Lasix she diureses significantly she is only positive approximately 100 mL. - Basic metabolic panels pending.  Iron deficiency anemia: - Continue iron supplementation.  Constipation  cont  MiraLAX to her regimen.  UTI (urinary tract infection) - U.C. grew E. Coli sensitive to PCN. - Cont Abx for 2 additional days.    DVT Prophylaxis - Lovenox ordered.  Family Communication: none Disposition Plan: Home 3 days Code Status:     Code Status Orders        Start     Ordered   02/05/15 2115  Full code   Continuous     02/05/15 2116        IV Access:    Peripheral IV   Procedures and diagnostic studies:   Dg Chest 1 View  02/08/2015  CLINICAL DATA:  Dyspnea. No chest complaints. EXAM: CHEST 1 VIEW COMPARISON:  03/20/2009 FINDINGS: Convex right thoracic spine curvature. Midline trachea. Mild cardiomegaly. Probable small bilateral pleural effusions. No pneumothorax. Biapical pleural thickening. Interstitial prominence and indistinctness. Patchy right upper lobe airspace disease. Right infrahilar confluent opacity. Mild left base atelectasis. IMPRESSION: Patchy right upper an more confluent right infrahilar/lower lung opacity. Presuming the patient has  infectious symptoms, this could represent pneumonia. Centrally obstructing lesion with postobstructive pneumonitis is a secondary concern in the right infrahilar region. Bilateral pleural effusions with suspicion of mild pulmonary venous congestion. Consider PA and lateral radiographs for further characterization. Followup PA and lateral chest X-ray is also recommended in 3-4 weeks following trial of antibiotic therapy to ensure resolution and exclude underlying malignancy. Electronically Signed   By: Abigail Miyamoto M.D.   On: 02/08/2015 11:02     Medical Consultants:    None.  Anti-Infectives:   Anti-infectives    Start     Dose/Rate Route Frequency Ordered Stop   02/08/15 1800  vancomycin (VANCOCIN) IVPB 750 mg/150 ml premix  Status:  Discontinued     750 mg 150 mL/hr over 60 Minutes Intravenous Every 12 hours 02/08/15 0851 02/08/15 0948   02/08/15 1800  vancomycin (VANCOCIN) IVPB 750 mg/150 ml premix     750 mg 150 mL/hr over 60 Minutes Intravenous Every 12 hours 02/08/15 1102     02/08/15 1400  amoxicillin (AMOXIL) capsule 500 mg     500 mg Oral 3 times per day 02/08/15 0948     02/08/15 1000  doxycycline (VIBRA-TABS) tablet 100 mg  Status:  Discontinued     100 mg Oral Every 12 hours 02/08/15 0948 02/08/15 0956   02/06/15 1000  cefTRIAXone (ROCEPHIN) 1 g in dextrose 5 % 50 mL IVPB  Status:  Discontinued     1 g 100 mL/hr over 30 Minutes Intravenous Every 24 hours 02/06/15 0839 02/08/15  6269   02/06/15 0800  vancomycin (VANCOCIN) 500 mg in sodium chloride 0.9 % 100 mL IVPB  Status:  Discontinued     500 mg 100 mL/hr over 60 Minutes Intravenous Every 12 hours 02/05/15 1946 02/08/15 0851   02/06/15 0200  piperacillin-tazobactam (ZOSYN) IVPB 3.375 g  Status:  Discontinued     3.375 g 12.5 mL/hr over 240 Minutes Intravenous Every 8 hours 02/05/15 1945 02/06/15 0839   02/05/15 1830  piperacillin-tazobactam (ZOSYN) IVPB 3.375 g     3.375 g 100 mL/hr over 30 Minutes Intravenous  Once  02/05/15 1826 02/05/15 1921   02/05/15 1830  vancomycin (VANCOCIN) IVPB 1000 mg/200 mL premix     1,000 mg 200 mL/hr over 60 Minutes Intravenous  Once 02/05/15 1826 02/05/15 2000      Subjective:    Belinda Harper she is SOB today, leg pain has improved she is able to walk without any difficulties.  Objective:    Filed Vitals:   02/08/15 0523 02/08/15 1500 02/08/15 2058 02/09/15 0542  BP: 145/55 141/67 98/82 145/66  Pulse: 106 106 86 84  Temp: 99.6 F (37.6 C) 98.9 F (37.2 C) 100 F (37.8 C) 98.8 F (37.1 C)  TempSrc: Oral Oral Oral Oral  Resp: 18 18 16 18   Weight:    72.2 kg (159 lb 2.8 oz)  SpO2: 90% 94% 93% 91%    Intake/Output Summary (Last 24 hours) at 02/09/15 0912 Last data filed at 02/08/15 1457  Gross per 24 hour  Intake    120 ml  Output   2450 ml  Net  -2330 ml   Filed Weights   02/05/15 2202 02/08/15 0500 02/09/15 0542  Weight: 70.4 kg (155 lb 3.3 oz) 70.4 kg (155 lb 3.3 oz) 72.2 kg (159 lb 2.8 oz)    Exam: Gen:  NAD Cardiovascular:  RRR, No M/R/G - JVD Chest and lungs:   CTAB Abdomen:  Abdomen soft, NT/ND, + BS Extremities:  No C/E/C Skin: Her left leg is erythematous wanted and tender to touch   Data Reviewed:    Labs: Basic Metabolic Panel:  Recent Labs Lab 02/05/15 1835 02/08/15 0734  NA 135 139  K 3.5 3.2*  CL 100* 107  CO2 26 26  GLUCOSE 124* 106*  BUN 23* 9  CREATININE 0.86 0.65  CALCIUM 9.1 7.8*   GFR Estimated Creatinine Clearance: 60.9 mL/min (by C-G formula based on Cr of 0.65). Liver Function Tests:  Recent Labs Lab 02/05/15 1835  AST 32  ALT 18  ALKPHOS 63  BILITOT 0.8  PROT 7.5  ALBUMIN 3.9   No results for input(s): LIPASE, AMYLASE in the last 168 hours. No results for input(s): AMMONIA in the last 168 hours. Coagulation profile No results for input(s): INR, PROTIME in the last 168 hours.  CBC:  Recent Labs Lab 02/05/15 1835 02/08/15 0734  WBC 13.4* 7.5  NEUTROABS 12.4*  --   HGB 11.6* 10.0*    HCT 36.4 32.9*  MCV 87.7 89.6  PLT 156 128*   Cardiac Enzymes:  Recent Labs Lab 02/05/15 2237  CKTOTAL 96   BNP (last 3 results) No results for input(s): PROBNP in the last 8760 hours. CBG: No results for input(s): GLUCAP in the last 168 hours. D-Dimer: No results for input(s): DDIMER in the last 72 hours. Hgb A1c: No results for input(s): HGBA1C in the last 72 hours. Lipid Profile: No results for input(s): CHOL, HDL, LDLCALC, TRIG, CHOLHDL, LDLDIRECT in the last 72 hours. Thyroid  function studies: No results for input(s): TSH, T4TOTAL, T3FREE, THYROIDAB in the last 72 hours.  Invalid input(s): FREET3 Anemia work up: No results for input(s): VITAMINB12, FOLATE, FERRITIN, TIBC, IRON, RETICCTPCT in the last 72 hours. Sepsis Labs:  Recent Labs Lab 02/05/15 1835 02/05/15 1846 02/05/15 2237 02/08/15 0734  WBC 13.4*  --   --  7.5  LATICACIDVEN  --  1.19 0.8  --    Microbiology Recent Results (from the past 240 hour(s))  Blood Culture (routine x 2)     Status: None (Preliminary result)   Collection Time: 02/05/15  5:30 PM  Result Value Ref Range Status   Specimen Description BLOOD LAC  Final   Special Requests BOTTLES DRAWN AEROBIC AND ANAEROBIC 7 CC  Final   Culture   Final    NO GROWTH 3 DAYS Performed at [facility]    Report Status PENDING  Incomplete  Blood Culture (routine x 2)     Status: None (Preliminary result)   Collection Time: 02/05/15  5:30 PM  Result Value Ref Range Status   Specimen Description BLOOD RIGHT ANTECUBITAL  Final   Special Requests BOTTLES DRAWN AEROBIC AND ANAEROBIC 5 CC  Final   Culture   Final    NO GROWTH 3 DAYS Performed at [facility]    Report Status PENDING  Incomplete  Urine culture     Status: None   Collection Time: 02/05/15  6:32 PM  Result Value Ref Range Status   Specimen Description URINE, CLEAN CATCH  Final   Special Requests NONE  Final   Culture   Final    >=100,000 COLONIES/mL ESCHERICHIA  COLI Performed at [facility]    Report Status 02/07/2015 FINAL  Final   Organism ID, Bacteria ESCHERICHIA COLI  Final      Susceptibility   Escherichia coli - MIC*    AMPICILLIN 8 SENSITIVE Sensitive     CEFAZOLIN <=4 SENSITIVE Sensitive     CEFTRIAXONE <=1 SENSITIVE Sensitive     CIPROFLOXACIN >=4 RESISTANT Resistant     GENTAMICIN <=1 SENSITIVE Sensitive     IMIPENEM <=0.25 SENSITIVE Sensitive     NITROFURANTOIN <=16 SENSITIVE Sensitive     TRIMETH/SULFA <=20 SENSITIVE Sensitive     AMPICILLIN/SULBACTAM 4 SENSITIVE Sensitive     PIP/TAZO <=4 SENSITIVE Sensitive     * >=100,000 COLONIES/mL ESCHERICHIA COLI     Medications:   . amoxicillin  500 mg Oral 3 times per day  . antiseptic oral rinse  7 mL Mouth Rinse q12n4p  . chlorhexidine  15 mL Mouth Rinse BID  . docusate sodium  100 mg Oral Daily  . enoxaparin (LOVENOX) injection  40 mg Subcutaneous Q24H  . ferrous gluconate  324 mg Oral QHS  . polyethylene glycol  17 g Oral Daily  . sodium chloride  3 mL Intravenous Q12H  . vancomycin  750 mg Intravenous Q12H   Continuous Infusions:    Time spent: 25 min   LOS: 4 days   Charlynne Cousins  [facility] Pager [phone]  *Please refer to [email or web address], password [email or web address] to get updated schedule on who will round on this patient, as hospitalists switch teams weekly. If 7PM-7AM, please contact night-coverage at [email or web address], password [email or web address] for any overnight needs.  02/09/2015, 9:12 AM

## 2015-02-10 LAB — CULTURE, BLOOD (ROUTINE X 2)
Culture: NO GROWTH
Culture: NO GROWTH

## 2015-02-10 LAB — C-REACTIVE PROTEIN: CRP: 22 mg/dL — ABNORMAL HIGH (ref <1.0)

## 2015-02-10 MED ORDER — POTASSIUM CHLORIDE CRYS ER 20 MEQ PO TBCR
40.0000 meq | EXTENDED_RELEASE_TABLET | Freq: Once | ORAL | Status: AC
  Administered 2015-02-10: 40 meq via ORAL
  Filled 2015-02-10: qty 2

## 2015-02-10 MED ORDER — GI COCKTAIL ~~LOC~~
30.0000 mL | Freq: Once | ORAL | Status: AC
  Administered 2015-02-10: 30 mL via ORAL
  Filled 2015-02-10: qty 30

## 2015-02-10 MED ORDER — POTASSIUM CHLORIDE CRYS ER 20 MEQ PO TBCR: 40.0000 meq | EXTENDED_RELEASE_TABLET | Freq: Two times a day (BID) | ORAL | Status: DC

## 2015-02-10 NOTE — Progress Notes (Signed)
ANTIBIOTIC CONSULT NOTE - FOLLOW UP  Pharmacy Consult for Vancomycin Indication: Cellulitis  Allergies  Allergen Reactions  . Other Other (See Comments)    Bee sting - blood pressure dropped, passed out  . Sulfa Antibiotics Rash  . Sulfamethoxazole Rash    Patient Measurements: Weight: 159 lb 2.8 oz (72.2 kg)  Vital Signs: Temp: 98.6 F (37 C) (10/18 0523) Temp Source: Oral (10/18 0523) BP: 120/59 mmHg (10/18 0523) Pulse Rate: 77 (10/18 0523) Intake/Output from previous day: 10/17 0701 - 10/18 0700 In: 360 [P.O.:360] Out: 700 [Urine:700]  Labs:  Recent Labs  02/08/15 0734 02/09/15 1020  WBC 7.5  --   HGB 10.0*  --   PLT 128*  --   CREATININE 0.65 0.62   Estimated Creatinine Clearance: 60.9 mL/min (by C-G formula based on Cr of 0.62).  Recent Labs  02/08/15 0734  VANCOTROUGH 7*    Assessment: 67 yoF presented 10/13 from PCP with CC left leg redness and fever to start vancomycin and Zosyn per pharmacy for cellulitis and rule out sepsis.  Zosyn was narrowed to amoxicillin per MD dosing for [facility] UTI.    Today, 02/10/2015: Afebrile WBC WNL Crcl ~ 60 ml/min MD notes document that leg continues to be swollen and red.  Will recheck a vancomycin trough today to make sure level therapeutic given slow improvement of cellulitis.  10/13 >> Vanc >> 10/13 >> Zosyn >> 10/14 10/14 >> CTX >> 10/16 10/16 >> Amoxicillin >>  10/13 blood x 2: NGTD 10/13 urine: >/= 100,000 colonies/mL E.coli (pan-sens to all tested except cipro)  Dose changes/levels: 10/16 0730 VT = 7 on 500 q12h, increase to 750 q12h 10/18 1700 VT = ____ on 750 q12h  Goal of Therapy:  Vancomycin trough level 10-15 mcg/ml  Plan:  Check vancomycin trough this evening and adjust dose as needed.  Hershal Coria, PharmD, BCPS Pager: [phone] 02/10/2015 1:02 PM

## 2015-02-10 NOTE — Progress Notes (Addendum)
[facility] PROGRESS NOTE    Progress Note   Belinda Harper NUU:[id number] DOB: 03-28-1941 DOA: 02/05/2015 PCP: [name], MD   Brief Narrative:   Belinda Harper is an 74 y.o. female who came into the hospital complaining of lower extremity left leg pain and fever and diagnosed with lower extremity cellulitis. With slow to improve cellulitis has remained afebrile leukocytosis improved.  Assessment/Plan:   Sepsis due to cellulitis ([place]) - She was empirically on IV vancomycin, leg continues to be swollen and erythematous him warm to touch. M.D. Elta [place] a the area. - Blood cultures are negative.  Volume Overload: - Likely due to IV fluid. 2-D echo done on 02/10/2015 showed an EF of 60% without evidence of ischemia, no diastolic heart failure no motion abnormality. - Given a single dose of Lasix. - Basic metabolic panels to electrolyte within normal limits and creatinine at baseline. Only positive about 400 mL.  Iron deficiency anemia: - Continue iron supplementation.  Constipation  cont  MiraLAX to her regimen.  UTI (urinary tract infection) - U.C. grew E. Coli sensitive to PCN. - Cont Abx for 2 additional days.    DVT Prophylaxis - Lovenox ordered.  Family Communication: none Disposition Plan: Home 2 days Code Status:     Code Status Orders        Start     Ordered   02/05/15 2115  Full code   Continuous     02/05/15 2116        IV Access:    Peripheral IV   Procedures and diagnostic studies:   Dg Chest 1 View  02/08/2015  CLINICAL DATA:  Dyspnea. No chest complaints. EXAM: CHEST 1 VIEW COMPARISON:  03/20/2009 FINDINGS: Convex right thoracic spine curvature. Midline trachea. Mild cardiomegaly. Probable small bilateral pleural effusions. No pneumothorax. Biapical pleural thickening. Interstitial prominence and indistinctness. Patchy right upper lobe airspace disease. Right infrahilar confluent opacity. Mild left base atelectasis. IMPRESSION:  Patchy right upper an more confluent right infrahilar/lower lung opacity. Presuming the patient has infectious symptoms, this could represent pneumonia. Centrally obstructing lesion with postobstructive pneumonitis is a secondary concern in the right infrahilar region. Bilateral pleural effusions with suspicion of mild pulmonary venous congestion. Consider PA and lateral radiographs for further characterization. Followup PA and lateral chest X-ray is also recommended in 3-4 weeks following trial of antibiotic therapy to ensure resolution and exclude underlying malignancy. Electronically Signed   By: Abigail Miyamoto M.D.   On: 02/08/2015 11:02     Medical Consultants:    None.  Anti-Infectives:   Anti-infectives    Start     Dose/Rate Route Frequency Ordered Stop   02/09/15 1400  amoxicillin (AMOXIL) capsule 500 mg     500 mg Oral 3 times per day 02/09/15 0924 02/11/15 1359   02/08/15 1800  vancomycin (VANCOCIN) IVPB 750 mg/150 ml premix  Status:  Discontinued     750 mg 150 mL/hr over 60 Minutes Intravenous Every 12 hours 02/08/15 0851 02/08/15 0948   02/08/15 1800  vancomycin (VANCOCIN) IVPB 750 mg/150 ml premix     750 mg 150 mL/hr over 60 Minutes Intravenous Every 12 hours 02/08/15 1102     02/08/15 1400  amoxicillin (AMOXIL) capsule 500 mg  Status:  Discontinued     500 mg Oral 3 times per day 02/08/15 0948 02/09/15 0924   02/08/15 1000  doxycycline (VIBRA-TABS) tablet 100 mg  Status:  Discontinued     100 mg Oral Every 12 hours 02/08/15 0948 02/08/15  3536   02/06/15 1000  cefTRIAXone (ROCEPHIN) 1 g in dextrose 5 % 50 mL IVPB  Status:  Discontinued     1 g 100 mL/hr over 30 Minutes Intravenous Every 24 hours 02/06/15 0839 02/08/15 0948   02/06/15 0800  vancomycin (VANCOCIN) 500 mg in sodium chloride 0.9 % 100 mL IVPB  Status:  Discontinued     500 mg 100 mL/hr over 60 Minutes Intravenous Every 12 hours 02/05/15 1946 02/08/15 0851   02/06/15 0200  piperacillin-tazobactam (ZOSYN) IVPB  3.375 g  Status:  Discontinued     3.375 g 12.5 mL/hr over 240 Minutes Intravenous Every 8 hours 02/05/15 1945 02/06/15 0839   02/05/15 1830  piperacillin-tazobactam (ZOSYN) IVPB 3.375 g     3.375 g 100 mL/hr over 30 Minutes Intravenous  Once 02/05/15 1826 02/05/15 1921   02/05/15 1830  vancomycin (VANCOCIN) IVPB 1000 mg/200 mL premix     1,000 mg 200 mL/hr over 60 Minutes Intravenous  Once 02/05/15 1826 02/05/15 2000      Subjective:    Curley Spice she is SOB today, she relates today some leg pain with ambulation.  Objective:    Filed Vitals:   02/09/15 0542 02/09/15 1503 02/09/15 2036 02/10/15 0523  BP: 145/66 151/76 134/61 120/59  Pulse: 84 102  77  Temp: 98.8 F (37.1 C) 99.2 F (37.3 C) 99.3 F (37.4 C) 98.6 F (37 C)  TempSrc: Oral Oral Oral Oral  Resp: 18 18 18 19   Weight: 72.2 kg (159 lb 2.8 oz)     SpO2: 91% 96% 78% 100%    Intake/Output Summary (Last 24 hours) at 02/10/15 1022 Last data filed at 02/10/15 0600  Gross per 24 hour  Intake    240 ml  Output    450 ml  Net   -210 ml   Filed Weights   02/05/15 2202 02/08/15 0500 02/09/15 0542  Weight: 70.4 kg (155 lb 3.3 oz) 70.4 kg (155 lb 3.3 oz) 72.2 kg (159 lb 2.8 oz)    Exam: Gen:  NAD Cardiovascular:  RRR, No M/R/G - JVD Chest and lungs:   She has good air movement and clear to auscultation. Abdomen:  Abdomen soft, NT/ND, + BS Extremities:  No C/E/C Skin: Her left leg is erythematous wanted and tender to touch   Data Reviewed:    Labs: Basic Metabolic Panel:  Recent Labs Lab 02/05/15 1835 02/08/15 0734 02/09/15 1020  NA 135 139 140  K 3.5 3.2* 3.5  CL 100* 107 103  CO2 26 26 32  GLUCOSE 124* 106* 122*  BUN 23* 9 10  CREATININE 0.86 0.65 0.62  CALCIUM 9.1 7.8* 8.7*   GFR Estimated Creatinine Clearance: 60.9 mL/min (by C-G formula based on Cr of 0.62). Liver Function Tests:  Recent Labs Lab 02/05/15 1835  AST 32  ALT 18  ALKPHOS 63  BILITOT 0.8  PROT 7.5  ALBUMIN 3.9    No results for input(s): LIPASE, AMYLASE in the last 168 hours. No results for input(s): AMMONIA in the last 168 hours. Coagulation profile No results for input(s): INR, PROTIME in the last 168 hours.  CBC:  Recent Labs Lab 02/05/15 1835 02/08/15 0734  WBC 13.4* 7.5  NEUTROABS 12.4*  --   HGB 11.6* 10.0*  HCT 36.4 32.9*  MCV 87.7 89.6  PLT 156 128*   Cardiac Enzymes:  Recent Labs Lab 02/05/15 2237  CKTOTAL 96   BNP (last 3 results) No results for input(s): PROBNP in the last  8760 hours. CBG: No results for input(s): GLUCAP in the last 168 hours. D-Dimer: No results for input(s): DDIMER in the last 72 hours. Hgb A1c: No results for input(s): HGBA1C in the last 72 hours. Lipid Profile: No results for input(s): CHOL, HDL, LDLCALC, TRIG, CHOLHDL, LDLDIRECT in the last 72 hours. Thyroid function studies: No results for input(s): TSH, T4TOTAL, T3FREE, THYROIDAB in the last 72 hours.  Invalid input(s): FREET3 Anemia work up: No results for input(s): VITAMINB12, FOLATE, FERRITIN, TIBC, IRON, RETICCTPCT in the last 72 hours. Sepsis Labs:  Recent Labs Lab 02/05/15 1835 02/05/15 1846 02/05/15 2237 02/08/15 0734  WBC 13.4*  --   --  7.5  LATICACIDVEN  --  1.19 0.8  --    Microbiology Recent Results (from the past 240 hour(s))  Blood Culture (routine x 2)     Status: None (Preliminary result)   Collection Time: 02/05/15  5:30 PM  Result Value Ref Range Status   Specimen Description BLOOD LAC  Final   Special Requests BOTTLES DRAWN AEROBIC AND ANAEROBIC 7 CC  Final   Culture   Final    NO GROWTH 4 DAYS Performed at [facility]    Report Status PENDING  Incomplete  Blood Culture (routine x 2)     Status: None (Preliminary result)   Collection Time: 02/05/15  5:30 PM  Result Value Ref Range Status   Specimen Description BLOOD RIGHT ANTECUBITAL  Final   Special Requests BOTTLES DRAWN AEROBIC AND ANAEROBIC 5 CC  Final   Culture   Final    NO GROWTH 4  DAYS Performed at [facility]    Report Status PENDING  Incomplete  Urine culture     Status: None   Collection Time: 02/05/15  6:32 PM  Result Value Ref Range Status   Specimen Description URINE, CLEAN CATCH  Final   Special Requests NONE  Final   Culture   Final    >=100,000 COLONIES/mL ESCHERICHIA COLI Performed at [facility]    Report Status 02/07/2015 FINAL  Final   Organism ID, Bacteria ESCHERICHIA COLI  Final      Susceptibility   Escherichia coli - MIC*    AMPICILLIN 8 SENSITIVE Sensitive     CEFAZOLIN <=4 SENSITIVE Sensitive     CEFTRIAXONE <=1 SENSITIVE Sensitive     CIPROFLOXACIN >=4 RESISTANT Resistant     GENTAMICIN <=1 SENSITIVE Sensitive     IMIPENEM <=0.25 SENSITIVE Sensitive     NITROFURANTOIN <=16 SENSITIVE Sensitive     TRIMETH/SULFA <=20 SENSITIVE Sensitive     AMPICILLIN/SULBACTAM 4 SENSITIVE Sensitive     PIP/TAZO <=4 SENSITIVE Sensitive     * >=100,000 COLONIES/mL ESCHERICHIA COLI     Medications:   . amoxicillin  500 mg Oral 3 times per day  . antiseptic oral rinse  7 mL Mouth Rinse q12n4p  . chlorhexidine  15 mL Mouth Rinse BID  . docusate sodium  100 mg Oral Daily  . enoxaparin (LOVENOX) injection  40 mg Subcutaneous Q24H  . ferrous gluconate  324 mg Oral QHS  . polyethylene glycol  17 g Oral Daily  . potassium chloride  40 mEq Oral BID  . sodium chloride  3 mL Intravenous Q12H  . vancomycin  750 mg Intravenous Q12H   Continuous Infusions:    Time spent: 25 min   LOS: 5 days   Charlynne Cousins  [facility] Pager [phone]  *Please refer to [email or web address], password [email or web address] to get updated schedule on  who will round on this patient, as hospitalists switch teams weekly. If 7PM-7AM, please contact night-coverage at [email or web address], password [email or web address] for any overnight needs.  02/10/2015, 10:22 AM

## 2015-02-10 NOTE — Evaluation (Signed)
Physical Therapy Evaluation Patient Details Name: Belinda Harper MRN: [id number] DOB: October 28, 1940 Today's Date: 02/10/2015   History of Present Illness  Belinda Harper is a 74 y.o. female with Past medical history of iron deficiency anemia, L ankle fx 2010, chronic constipation admitted with LLE cellulitis with sepsis, volume overload, UTI.    Clinical Impression  Pt admitted with above diagnosis. Pt currently with functional limitations due to the deficits listed below (see PT Problem List). Pt ambulated 280' without an assistive device, SaO2 dropped to 84% on RA, up to 92% on 2L O2 [place].  Pt will benefit from skilled PT to increase their independence and safety with mobility to allow discharge to the venue listed below.       Follow Up Recommendations No PT follow up    Equipment Recommendations  None recommended by PT    Recommendations for Other Services       Precautions / Restrictions Precautions Precautions: Other (comment) Precaution Comments: monitor O2 Restrictions Weight Bearing Restrictions: No      Mobility  Bed Mobility Overal bed mobility: Modified Independent             General bed mobility comments: with rail  Transfers Overall transfer level: Independent Equipment used: None                Ambulation/Gait Ambulation/Gait assistance: Independent Ambulation Distance (Feet): 280 Feet Assistive device: None Gait Pattern/deviations: WFL(Within Functional Limits)   Gait velocity interpretation: at or above normal speed for age/gender General Gait Details: steady without AD, SaO2 dropped to 84% after walking 140', applied 2L O2, sats came up to 92% on 2L. Verbal cues for pursed lip breathing.   Stairs            Wheelchair Mobility    Modified Rankin (Stroke Patients Only)       Balance Overall balance assessment: Independent                                           Pertinent Vitals/Pain Pain Assessment:  0-10 Pain Score: 1  Pain Location: LLE with walking Pain Descriptors / Indicators: Sore Pain Intervention(s): Limited activity within patient's tolerance;Monitored during session;Repositioned    Home Living Family/patient expects to be discharged to:: Private residence Living Arrangements: Non-relatives/Friends (pt has a Teacher, English as a foreign language) Available Help at Discharge: Available PRN/intermittently   Home Access: Stairs to enter   Technical brewer of Steps: 3 Home Layout: One level Home Equipment: Environmental consultant - 2 wheels      Prior Function Level of Independence: Independent         Comments: pt very active, works in yard     Journalist, newspaper        Extremity/Trunk Assessment               Lower Extremity Assessment: LLE deficits/detail   LLE Deficits / Details: h/o L ankle fx 2010, LLE shorter than RLE wears a lift shoe, L ankle AROM decr 50% at baseline 2* prior fx, knee/hip WNL, sensation intact  Cervical / Trunk Assessment: Normal  Communication   Communication: No difficulties  Cognition Arousal/Alertness: Awake/alert Behavior During Therapy: WFL for tasks assessed/performed Overall Cognitive Status: Within Functional Limits for tasks assessed                      General Comments      Exercises  Assessment/Plan    PT Assessment Patient needs continued PT services  PT Diagnosis Difficulty walking   PT Problem List Cardiopulmonary status limiting activity  PT Treatment Interventions Gait training;Patient/family education   PT Goals (Current goals can be found in the Care Plan section) Acute Rehab PT Goals Patient Stated Goal: to work in her yard PT Goal Formulation: With patient Time For Goal Achievement: 02/24/15 Potential to Achieve Goals: Good    Frequency Min 3X/week   Barriers to discharge        Co-evaluation               End of Session Equipment Utilized During Treatment: Oxygen;Gait belt Activity Tolerance: Patient  tolerated treatment well Patient left: in chair;with call bell/phone within reach Nurse Communication: Mobility status         Time: 2800-3491 PT Time Calculation (min) (ACUTE ONLY): 33 min   Charges:   PT Evaluation $Initial PT Evaluation Tier I: 1 Procedure PT Treatments $Gait Training: 8-22 mins   PT G Codes:        Blondell Reveal Kistler 02/10/2015, 12:12 PM [phone]

## 2015-02-10 NOTE — Progress Notes (Signed)
The Vancomycin trough for 1700 did not get drawn. Reschedule for 0530.   Romeo Rabon, PharmD, pager [phone]. 02/10/2015,5:46 PM.

## 2015-02-10 NOTE — Progress Notes (Signed)
SATURATION QUALIFICATIONS: (This note is used to comply with regulatory documentation for home oxygen)  Patient Saturations on Room Air at Rest = 94%  Patient Saturations on Room Air while Ambulating = 84%  Patient Saturations on 2 Liters of oxygen while Ambulating = 92%  Please briefly explain why patient needs home oxygen: to maintain appropriate oxygen saturation levels.   Belinda Harper PT 02/10/2015  [phone]

## 2015-02-11 DIAGNOSIS — K5900 Constipation, unspecified: Secondary | ICD-10-CM

## 2015-02-11 DIAGNOSIS — L03116 Cellulitis of left lower limb: Secondary | ICD-10-CM

## 2015-02-11 DIAGNOSIS — L0390 Cellulitis, unspecified: Secondary | ICD-10-CM

## 2015-02-11 DIAGNOSIS — A419 Sepsis, unspecified organism: Principal | ICD-10-CM

## 2015-02-11 DIAGNOSIS — D509 Iron deficiency anemia, unspecified: Secondary | ICD-10-CM

## 2015-02-11 DIAGNOSIS — N3000 Acute cystitis without hematuria: Secondary | ICD-10-CM

## 2015-02-11 LAB — BASIC METABOLIC PANEL
Anion gap: 7 (ref 5–15)
BUN: 10 mg/dL (ref 6–20)
CO2: 32 mmol/L (ref 22–32)
Calcium: 9.3 mg/dL (ref 8.9–10.3)
Chloride: 102 mmol/L (ref 101–111)
Creatinine, Ser: 0.63 mg/dL (ref 0.44–1.00)
GFR calc Af Amer: >60 mL/min (ref >60)
GFR calc non Af Amer: >60 mL/min (ref >60)
Glucose, Bld: 103 mg/dL — ABNORMAL HIGH (ref 65–99)
Potassium: 3.3 mmol/L — ABNORMAL LOW (ref 3.5–5.1)
Sodium: 141 mmol/L (ref 135–145)

## 2015-02-11 LAB — VANCOMYCIN, TROUGH: Vancomycin Tr: 10 ug/mL (ref 10.0–20.0)

## 2015-02-11 LAB — CBC
HCT: 34.1 % — ABNORMAL LOW (ref 36.0–46.0)
Hemoglobin: 10.6 g/dL — ABNORMAL LOW (ref 12.0–15.0)
MCH: 27.6 pg (ref 26.0–34.0)
MCHC: 31.1 g/dL (ref 30.0–36.0)
MCV: 88.8 fL (ref 78.0–100.0)
Platelets: 250 10*3/uL (ref 150–400)
RBC: 3.84 MIL/uL — ABNORMAL LOW (ref 3.87–5.11)
RDW: 16.2 % — ABNORMAL HIGH (ref 11.5–15.5)
WBC: 4.4 10*3/uL (ref 4.0–10.5)

## 2015-02-11 LAB — MAGNESIUM: Magnesium: 2.2 mg/dL (ref 1.7–2.4)

## 2015-02-11 MED ORDER — FUROSEMIDE 10 MG/ML IJ SOLN
20.0000 mg | Freq: Two times a day (BID) | INTRAMUSCULAR | Status: DC
  Administered 2015-02-11 – 2015-02-12 (×2): 20 mg via INTRAVENOUS
  Filled 2015-02-11 (×4): qty 2

## 2015-02-11 MED ORDER — CEFAZOLIN SODIUM 1-5 GM-% IV SOLN
1.0000 g | Freq: Three times a day (TID) | INTRAVENOUS | Status: DC
  Administered 2015-02-11 – 2015-02-12 (×2): 1 g via INTRAVENOUS
  Filled 2015-02-11 (×4): qty 50

## 2015-02-11 MED ORDER — FUROSEMIDE 10 MG/ML IJ SOLN
20.0000 mg | Freq: Two times a day (BID) | INTRAMUSCULAR | Status: DC
  Administered 2015-02-11: 20 mg via INTRAVENOUS
  Filled 2015-02-11: qty 2

## 2015-02-11 MED ORDER — FUROSEMIDE 10 MG/ML IJ SOLN: 40.0000 mg | Freq: Two times a day (BID) | INTRAMUSCULAR | Status: DC

## 2015-02-11 MED ORDER — VANCOMYCIN HCL IN DEXTROSE 1-5 GM/200ML-% IV SOLN
1000.0000 mg | Freq: Two times a day (BID) | INTRAVENOUS | Status: DC
  Filled 2015-02-11: qty 200

## 2015-02-11 NOTE — Progress Notes (Signed)
[facility] PROGRESS NOTE  Belinda Harper YBW:[id number] DOB: May 13, 1940 DOA: 02/05/2015 PCP: [name], MD  Assessment/Plan: #1 sepsis secondary to left lower extremity cellulitis Some clinical improvement. Less warmth. Patient is afebrile. WBC is trending down. Patient on IV vancomycin. Will change to IV Ancef. Reassess the morning.  #2 left lower extremity cellulitis Some clinical improvement. Patient is afebrile. Leukocytosis is trending down. Patient was on IV vancomycin and IV Zosyn on admission and currently just on IV vancomycin. Change IV vancomycin to IV Ancef. On discharge were transitioned to oral Augmentin and doxycycline to complete course of antibiotic therapy.  #3 volume overload Likely secondary to aggressive IV fluid hydration. 2-D echo done 02/10/2015 with a EF of 60% with no wall motion abnormalities. Patient was given a dose of IV Lasix with good output per patient however output was not recorded. Patient noted to have bibasilar crackles. Will place on IV Lasix and diuresed. Follow.  #4 iron deficiency anemia Continue iron supplementation.  #5 Escherichia coli UTI Status post course of antibiotic therapy.  #6 constipation Continue MiraLAX.  #7 prophylaxis Lovenox for DVT prophylaxis.  Code Status: Full Family Communication: Updated patient. No family at bedside. Disposition Plan: Home hopefully tomorrow.   Consultants:  None  Procedures:  Chest x-ray 02/08/2015  Plain films of the left tib-fib 02/05/2015  Antibiotics:  IV vancomycin 02/06/2015>>>> 02/11/2015  IV Zosyn 02/05/2015>>>>. 02/06/2015  IV Ancef 02/11/2015  HPI/Subjective: Patient states feeling better. Patient still with some shortness of breath. No nausea, no vomiting.  Objective: Filed Vitals:   02/11/15 0550  BP: 137/62  Pulse: 82  Temp: 98.9 F (37.2 C)  Resp: 18    Intake/Output Summary (Last 24 hours) at 02/11/15 1226 Last data filed at 02/11/15 0900  Gross per 24 hour  Intake    720 ml  Output      0 ml  Net    720 ml   Filed Weights   02/08/15 0500 02/09/15 0542 02/11/15 0550  Weight: 70.4 kg (155 lb 3.3 oz) 72.2 kg (159 lb 2.8 oz) 73.25 kg (161 lb 7.8 oz)    Exam:   General:  NAD  Cardiovascular: RRR  Respiratory: Bibasilar crackles. No wheezing.  Abdomen: Soft, nontender, nondistended, positive bowel sounds.  Musculoskeletal: No clubbing or cyanosis. Left lower extremity with 2+ edema, erythema, decreased warmth, some tenderness to palpation in the left lateral ankle region.  Data Reviewed: Basic Metabolic Panel:  Recent Labs Lab 02/05/15 1835 02/08/15 0734 02/09/15 1020 02/11/15 0932  NA 135 139 140 141  K 3.5 3.2* 3.5 3.3*  CL 100* 107 103 102  CO2 26 26 32 32  GLUCOSE 124* 106* 122* 103*  BUN 23* 9 10 10   CREATININE 0.86 0.65 0.62 0.63  CALCIUM 9.1 7.8* 8.7* 9.3  MG  --   --   --  2.2   Liver Function Tests:  Recent Labs Lab 02/05/15 1835  AST 32  ALT 18  ALKPHOS 63  BILITOT 0.8  PROT 7.5  ALBUMIN 3.9   No results for input(s): LIPASE, AMYLASE in the last 168 hours. No results for input(s): AMMONIA in the last 168 hours. CBC:  Recent Labs Lab 02/05/15 1835 02/08/15 0734 02/11/15 0932  WBC 13.4* 7.5 4.4  NEUTROABS 12.4*  --   --   HGB 11.6* 10.0* 10.6*  HCT 36.4 32.9* 34.1*  MCV 87.7 89.6 88.8  PLT 156 128* 250   Cardiac Enzymes:  Recent Labs Lab 02/05/15 2237  CKTOTAL 96  BNP (last 3 results)  Recent Labs  02/08/15 0734  BNP 422.4*    ProBNP (last 3 results) No results for input(s): PROBNP in the last 8760 hours.  CBG: No results for input(s): GLUCAP in the last 168 hours.  Recent Results (from the past 240 hour(s))  Blood Culture (routine x 2)     Status: None   Collection Time: 02/05/15  5:30 PM  Result Value Ref Range Status   Specimen Description BLOOD LAC  Final   Special Requests BOTTLES DRAWN AEROBIC AND ANAEROBIC 7 CC  Final   Culture   Final    NO  GROWTH 5 DAYS Performed at [facility]    Report Status 02/10/2015 FINAL  Final  Blood Culture (routine x 2)     Status: None   Collection Time: 02/05/15  5:30 PM  Result Value Ref Range Status   Specimen Description BLOOD RIGHT ANTECUBITAL  Final   Special Requests BOTTLES DRAWN AEROBIC AND ANAEROBIC 5 CC  Final   Culture   Final    NO GROWTH 5 DAYS Performed at [facility]    Report Status 02/10/2015 FINAL  Final  Urine culture     Status: None   Collection Time: 02/05/15  6:32 PM  Result Value Ref Range Status   Specimen Description URINE, CLEAN CATCH  Final   Special Requests NONE  Final   Culture   Final    >=100,000 COLONIES/mL ESCHERICHIA COLI Performed at [facility]    Report Status 02/07/2015 FINAL  Final   Organism ID, Bacteria ESCHERICHIA COLI  Final      Susceptibility   Escherichia coli - MIC*    AMPICILLIN 8 SENSITIVE Sensitive     CEFAZOLIN <=4 SENSITIVE Sensitive     CEFTRIAXONE <=1 SENSITIVE Sensitive     CIPROFLOXACIN >=4 RESISTANT Resistant     GENTAMICIN <=1 SENSITIVE Sensitive     IMIPENEM <=0.25 SENSITIVE Sensitive     NITROFURANTOIN <=16 SENSITIVE Sensitive     TRIMETH/SULFA <=20 SENSITIVE Sensitive     AMPICILLIN/SULBACTAM 4 SENSITIVE Sensitive     PIP/TAZO <=4 SENSITIVE Sensitive     * >=100,000 COLONIES/mL ESCHERICHIA COLI     Studies: No results found.  Scheduled Meds: . antiseptic oral rinse  7 mL Mouth Rinse q12n4p  . chlorhexidine  15 mL Mouth Rinse BID  . docusate sodium  100 mg Oral Daily  . enoxaparin (LOVENOX) injection  40 mg Subcutaneous Q24H  . ferrous gluconate  324 mg Oral QHS  . furosemide  40 mg Intravenous Q12H  . polyethylene glycol  17 g Oral Daily  . sodium chloride  3 mL Intravenous Q12H  . vancomycin  1,000 mg Intravenous Q12H   Continuous Infusions:   Principal Problem:   Sepsis due to cellulitis (HCC) Active Problems:   Iron deficiency anemia   Constipation   UTI (urinary tract  infection)    Time spent: 55 minutes    THOMPSON,DANIEL M.D. [facility] Pager [phone]. If 7PM-7AM, please contact night-coverage at [email or web address], password [facility] 02/11/2015, 12:26 PM  LOS: 6 days

## 2015-02-11 NOTE — Progress Notes (Signed)
ANTIBIOTIC CONSULT NOTE - FOLLOW UP  Pharmacy Consult for Vancomycin Indication: Cellulitis  Allergies  Allergen Reactions  . Other Other (See Comments)    Bee sting - blood pressure dropped, passed out  . Sulfa Antibiotics Rash  . Sulfamethoxazole Rash    Patient Measurements: Weight: 161 lb 7.8 oz (73.25 kg) Adjusted Body Weight:   Vital Signs: Temp: 98.9 F (37.2 C) (10/19 0550) Temp Source: Oral (10/19 0550) BP: 137/62 mmHg (10/19 0550) Pulse Rate: 82 (10/19 0550) Intake/Output from previous day: 10/18 0701 - 10/19 0700 In: 720 [P.O.:720] Out: -  Intake/Output from this shift: Total I/O In: 240 [P.O.:240] Out: -   Labs:  Recent Labs  02/08/15 0734 02/09/15 1020  WBC 7.5  --   HGB 10.0*  --   PLT 128*  --   CREATININE 0.65 0.62   Estimated Creatinine Clearance: 60.9 mL/min (by C-G formula based on Cr of 0.62).  Recent Labs  02/08/15 0734 02/11/15 0555  [place] 7* 10     Microbiology: Recent Results (from the past 720 hour(s))  Blood Culture (routine x 2)     Status: None   Collection Time: 02/05/15  5:30 PM  Result Value Ref Range Status   Specimen Description BLOOD LAC  Final   Special Requests BOTTLES DRAWN AEROBIC AND ANAEROBIC 7 CC  Final   Culture   Final    NO GROWTH 5 DAYS Performed at [facility]    Report Status 02/10/2015 FINAL  Final  Blood Culture (routine x 2)     Status: None   Collection Time: 02/05/15  5:30 PM  Result Value Ref Range Status   Specimen Description BLOOD RIGHT ANTECUBITAL  Final   Special Requests BOTTLES DRAWN AEROBIC AND ANAEROBIC 5 CC  Final   Culture   Final    NO GROWTH 5 DAYS Performed at [facility]    Report Status 02/10/2015 FINAL  Final  Urine culture     Status: None   Collection Time: 02/05/15  6:32 PM  Result Value Ref Range Status   Specimen Description URINE, CLEAN CATCH  Final   Special Requests NONE  Final   Culture   Final    >=100,000 COLONIES/mL ESCHERICHIA  COLI Performed at [facility]    Report Status 02/07/2015 FINAL  Final   Organism ID, Bacteria ESCHERICHIA COLI  Final      Susceptibility   Escherichia coli - MIC*    AMPICILLIN 8 SENSITIVE Sensitive     CEFAZOLIN <=4 SENSITIVE Sensitive     CEFTRIAXONE <=1 SENSITIVE Sensitive     CIPROFLOXACIN >=4 RESISTANT Resistant     GENTAMICIN <=1 SENSITIVE Sensitive     IMIPENEM <=0.25 SENSITIVE Sensitive     NITROFURANTOIN <=16 SENSITIVE Sensitive     TRIMETH/SULFA <=20 SENSITIVE Sensitive     AMPICILLIN/SULBACTAM 4 SENSITIVE Sensitive     PIP/TAZO <=4 SENSITIVE Sensitive     * >=100,000 COLONIES/mL ESCHERICHIA COLI    Anti-infectives    Start     Dose/Rate Route Frequency Ordered Stop   02/11/15 1800  vancomycin (VANCOCIN) IVPB 1000 mg/200 mL premix     1,000 mg 200 mL/hr over 60 Minutes Intravenous Every 12 hours 02/11/15 0642     02/09/15 1400  amoxicillin (AMOXIL) capsule 500 mg     500 mg Oral 3 times per day 02/09/15 0924 02/11/15 0552   02/08/15 1800  vancomycin (VANCOCIN) IVPB 750 mg/150 ml premix  Status:  Discontinued  750 mg 150 mL/hr over 60 Minutes Intravenous Every 12 hours 02/08/15 0851 02/08/15 0948   02/08/15 1800  vancomycin (VANCOCIN) IVPB 750 mg/150 ml premix  Status:  Discontinued     750 mg 150 mL/hr over 60 Minutes Intravenous Every 12 hours 02/08/15 1102 02/11/15 0641   02/08/15 1400  amoxicillin (AMOXIL) capsule 500 mg  Status:  Discontinued     500 mg Oral 3 times per day 02/08/15 0948 02/09/15 0924   02/08/15 1000  doxycycline (VIBRA-TABS) tablet 100 mg  Status:  Discontinued     100 mg Oral Every 12 hours 02/08/15 0948 02/08/15 0956   02/06/15 1000  cefTRIAXone (ROCEPHIN) 1 g in dextrose 5 % 50 mL IVPB  Status:  Discontinued     1 g 100 mL/hr over 30 Minutes Intravenous Every 24 hours 02/06/15 0839 02/08/15 0948   02/06/15 0800  vancomycin (VANCOCIN) 500 mg in sodium chloride 0.9 % 100 mL IVPB  Status:  Discontinued     500 mg 100 mL/hr over 60  Minutes Intravenous Every 12 hours 02/05/15 1946 02/08/15 0851   02/06/15 0200  piperacillin-tazobactam (ZOSYN) IVPB 3.375 g  Status:  Discontinued     3.375 g 12.5 mL/hr over 240 Minutes Intravenous Every 8 hours 02/05/15 1945 02/06/15 0839   02/05/15 1830  piperacillin-tazobactam (ZOSYN) IVPB 3.375 g     3.375 g 100 mL/hr over 30 Minutes Intravenous  Once 02/05/15 1826 02/05/15 1921   02/05/15 1830  vancomycin (VANCOCIN) IVPB 1000 mg/200 mL premix     1,000 mg 200 mL/hr over 60 Minutes Intravenous  Once 02/05/15 1826 02/05/15 2000      Assessment: Patient with redness still noted with cellulitis.  Vancomycin trough at lowest goal (10).  Will increase dose and change goal level to 15-20.  Goal of Therapy:  Vancomycin trough level 15-20 mcg/ml  Plan:  Measure antibiotic drug levels at steady state Follow up culture results Change vancomycin to 1gm iv q12hr  Tyler Deis, Shea Stakes [place] 02/11/2015,6:43 AM

## 2015-02-11 NOTE — Progress Notes (Signed)
ANTIBIOTIC CONSULT NOTE - FOLLOW UP  Pharmacy Consult for Vancomycin --> cefazolin Indication: Cellulitis  Allergies  Allergen Reactions  . Other Other (See Comments)    Bee sting - blood pressure dropped, passed out  . Sulfa Antibiotics Rash  . Sulfamethoxazole Rash    Patient Measurements: Weight: 161 lb 7.8 oz (73.25 kg) Adjusted Body Weight:   Vital Signs: Temp: 98.6 F (37 C) (10/19 1429) Temp Source: Oral (10/19 1429) BP: 130/77 mmHg (10/19 1511) Pulse Rate: 86 (10/19 1429) Intake/Output from previous day: 10/18 0701 - 10/19 0700 In: 720 [P.O.:720] Out: -  Intake/Output from this shift: Total I/O In: 240 [P.O.:240] Out: -   Labs:  Recent Labs  02/09/15 1020 02/11/15 0932  WBC  --  4.4  HGB  --  10.6*  PLT  --  250  CREATININE 0.62 0.63   Estimated Creatinine Clearance: 60.9 mL/min (by C-G formula based on Cr of 0.63).  Recent Labs  02/11/15 0555  [place] 10     Microbiology: Recent Results (from the past 720 hour(s))  Blood Culture (routine x 2)     Status: None   Collection Time: 02/05/15  5:30 PM  Result Value Ref Range Status   Specimen Description BLOOD LAC  Final   Special Requests BOTTLES DRAWN AEROBIC AND ANAEROBIC 7 CC  Final   Culture   Final    NO GROWTH 5 DAYS Performed at [facility]    Report Status 02/10/2015 FINAL  Final  Blood Culture (routine x 2)     Status: None   Collection Time: 02/05/15  5:30 PM  Result Value Ref Range Status   Specimen Description BLOOD RIGHT ANTECUBITAL  Final   Special Requests BOTTLES DRAWN AEROBIC AND ANAEROBIC 5 CC  Final   Culture   Final    NO GROWTH 5 DAYS Performed at [facility]    Report Status 02/10/2015 FINAL  Final  Urine culture     Status: None   Collection Time: 02/05/15  6:32 PM  Result Value Ref Range Status   Specimen Description URINE, CLEAN CATCH  Final   Special Requests NONE  Final   Culture   Final    >=100,000 COLONIES/mL ESCHERICHIA  COLI Performed at [facility]    Report Status 02/07/2015 FINAL  Final   Organism ID, Bacteria ESCHERICHIA COLI  Final      Susceptibility   Escherichia coli - MIC*    AMPICILLIN 8 SENSITIVE Sensitive     CEFAZOLIN <=4 SENSITIVE Sensitive     CEFTRIAXONE <=1 SENSITIVE Sensitive     CIPROFLOXACIN >=4 RESISTANT Resistant     GENTAMICIN <=1 SENSITIVE Sensitive     IMIPENEM <=0.25 SENSITIVE Sensitive     NITROFURANTOIN <=16 SENSITIVE Sensitive     TRIMETH/SULFA <=20 SENSITIVE Sensitive     AMPICILLIN/SULBACTAM 4 SENSITIVE Sensitive     PIP/TAZO <=4 SENSITIVE Sensitive     * >=100,000 COLONIES/mL ESCHERICHIA COLI    Anti-infectives    Start     Dose/Rate Route Frequency Ordered Stop   02/11/15 1800  vancomycin (VANCOCIN) IVPB 1000 mg/200 mL premix  Status:  Discontinued     1,000 mg 200 mL/hr over 60 Minutes Intravenous Every 12 hours 02/11/15 0642 02/11/15 1558   02/09/15 1400  amoxicillin (AMOXIL) capsule 500 mg     500 mg Oral 3 times per day 02/09/15 0924 02/11/15 0552   02/08/15 1800  vancomycin (VANCOCIN) IVPB 750 mg/150 ml premix  Status:  Discontinued  750 mg 150 mL/hr over 60 Minutes Intravenous Every 12 hours 02/08/15 0851 02/08/15 0948   02/08/15 1800  vancomycin (VANCOCIN) IVPB 750 mg/150 ml premix  Status:  Discontinued     750 mg 150 mL/hr over 60 Minutes Intravenous Every 12 hours 02/08/15 1102 02/11/15 0641   02/08/15 1400  amoxicillin (AMOXIL) capsule 500 mg  Status:  Discontinued     500 mg Oral 3 times per day 02/08/15 0948 02/09/15 0924   02/08/15 1000  doxycycline (VIBRA-TABS) tablet 100 mg  Status:  Discontinued     100 mg Oral Every 12 hours 02/08/15 0948 02/08/15 0956   02/06/15 1000  cefTRIAXone (ROCEPHIN) 1 g in dextrose 5 % 50 mL IVPB  Status:  Discontinued     1 g 100 mL/hr over 30 Minutes Intravenous Every 24 hours 02/06/15 0839 02/08/15 0948   02/06/15 0800  vancomycin (VANCOCIN) 500 mg in sodium chloride 0.9 % 100 mL IVPB  Status:   Discontinued     500 mg 100 mL/hr over 60 Minutes Intravenous Every 12 hours 02/05/15 1946 02/08/15 0851   02/06/15 0200  piperacillin-tazobactam (ZOSYN) IVPB 3.375 g  Status:  Discontinued     3.375 g 12.5 mL/hr over 240 Minutes Intravenous Every 8 hours 02/05/15 1945 02/06/15 0839   02/05/15 1830  piperacillin-tazobactam (ZOSYN) IVPB 3.375 g     3.375 g 100 mL/hr over 30 Minutes Intravenous  Once 02/05/15 1826 02/05/15 1921   02/05/15 1830  vancomycin (VANCOCIN) IVPB 1000 mg/200 mL premix     1,000 mg 200 mL/hr over 60 Minutes Intravenous  Once 02/05/15 1826 02/05/15 2000      Assessment: 74 yo presented from PCP with CC left leg redness and fever, started vancomycin and Zosyn initially per pharmacy for cellulitis and rule out sepsis. Antibiotics adjusted to cefazolin 10/19 to cover cellulitis and UTI.  10/13 >> Vanc >> 10/19 10/13 >> Zosyn >> 10/14 10/14 >> CTX >> 10/16 10/16 >> Amoxicillin >> 10/19 10/19 >> cefazolin >>  10/13 blood x 2: NGF 10/13 urine: >/= 100,000 colonies/mL E.coli (pan-sens to all tested except cipro)  Goal of Therapy:  Dose for indication and for patient-specific parameters  Plan:   Cefazolin 1gm IV q8h  No adjustment likely needed based on current renal function, will sign-off  Doreene Eland, PharmD, BCPS.   Pager: [phone] 02/11/2015,4:05 PM

## 2015-02-12 LAB — CBC
HCT: 31 % — ABNORMAL LOW (ref 36.0–46.0)
Hemoglobin: 9.9 g/dL — ABNORMAL LOW (ref 12.0–15.0)
MCH: 27.9 pg (ref 26.0–34.0)
MCHC: 31.9 g/dL (ref 30.0–36.0)
MCV: 87.3 fL (ref 78.0–100.0)
Platelets: 280 10*3/uL (ref 150–400)
RBC: 3.55 MIL/uL — ABNORMAL LOW (ref 3.87–5.11)
RDW: 16 % — ABNORMAL HIGH (ref 11.5–15.5)
WBC: 4 10*3/uL (ref 4.0–10.5)

## 2015-02-12 LAB — BASIC METABOLIC PANEL
Anion gap: 10 (ref 5–15)
BUN: 15 mg/dL (ref 6–20)
CO2: 33 mmol/L — ABNORMAL HIGH (ref 22–32)
Calcium: 8.9 mg/dL (ref 8.9–10.3)
Chloride: 98 mmol/L — ABNORMAL LOW (ref 101–111)
Creatinine, Ser: 0.68 mg/dL (ref 0.44–1.00)
GFR calc Af Amer: >60 mL/min (ref >60)
GFR calc non Af Amer: >60 mL/min (ref >60)
Glucose, Bld: 87 mg/dL (ref 65–99)
Potassium: 3.2 mmol/L — ABNORMAL LOW (ref 3.5–5.1)
Sodium: 141 mmol/L (ref 135–145)

## 2015-02-12 MED ORDER — AMOXICILLIN-POT CLAVULANATE 875-125 MG PO TABS: 1.0000 | ORAL_TABLET | Freq: Two times a day (BID) | ORAL | Status: DC

## 2015-02-12 MED ORDER — POTASSIUM CHLORIDE CRYS ER 20 MEQ PO TBCR
40.0000 meq | EXTENDED_RELEASE_TABLET | ORAL | Status: AC
  Administered 2015-02-12 (×3): 40 meq via ORAL
  Filled 2015-02-12 (×3): qty 2

## 2015-02-12 MED ORDER — DOXYCYCLINE HYCLATE 100 MG PO TABS: 100.0000 mg | ORAL_TABLET | Freq: Two times a day (BID) | ORAL | Status: DC

## 2015-02-12 MED ORDER — POLYETHYLENE GLYCOL 3350 17 G PO PACK: 17.0000 g | PACK | Freq: Every day | ORAL | Status: DC

## 2015-02-12 NOTE — Progress Notes (Signed)
Physical Therapy Treatment Patient Details Name: Belinda Harper MRN: [id number] DOB: 1940/05/11 Today's Date: 02/12/2015    History of Present Illness Belinda Harper is a 74 y.o. female with Past medical history of iron deficiency anemia, L ankle fx 2010, chronic constipation admitted with LLE cellulitis with sepsis, volume overload, UTI.      PT Comments    Pt is independent with mobility. SaO2 95-100% on RA with walking 350' without an assistive device. She is ready to DC home from PT standpoint.   Follow Up Recommendations  No PT follow up     Equipment Recommendations  None recommended by PT    Recommendations for Other Services       Precautions / Restrictions Precautions Precautions: None Restrictions Weight Bearing Restrictions: No    Mobility  Bed Mobility Overal bed mobility: Modified Independent             General bed mobility comments: with rail  Transfers Overall transfer level: Independent Equipment used: None                Ambulation/Gait Ambulation/Gait assistance: Independent Ambulation Distance (Feet): 350 Feet Assistive device: None Gait Pattern/deviations: WFL(Within Functional Limits)   Gait velocity interpretation: at or above normal speed for age/gender General Gait Details: steady without AD, SaO2 95-100% on RA with walking   Stairs            Wheelchair Mobility    Modified Rankin (Stroke Patients Only)       Balance Overall balance assessment: Independent                                  Cognition Arousal/Alertness: Awake/alert Behavior During Therapy: WFL for tasks assessed/performed Overall Cognitive Status: Within Functional Limits for tasks assessed                      Exercises      General Comments        Pertinent Vitals/Pain Pain Score: 1  Pain Location: LLE with walking Pain Descriptors / Indicators: Tightness Pain Intervention(s): Monitored during session     Home Living                      Prior Function            PT Goals (current goals can now be found in the care plan section) Acute Rehab PT Goals Patient Stated Goal: to work in her yard PT Goal Formulation: With patient Time For Goal Achievement: 02/24/15 Potential to Achieve Goals: Good Progress towards PT goals: Goals met/education completed, patient discharged from PT    Frequency  Min 3X/week    PT Plan Current plan remains appropriate    Co-evaluation             End of Session   Activity Tolerance: Patient tolerated treatment well Patient left: in chair;with call bell/phone within reach     Time: 1151-1206 PT Time Calculation (min) (ACUTE ONLY): 15 min  Charges:  $Gait Training: 8-22 mins                    G Codes:      Philomena Doheny 02/12/2015, 12:19 PM [phone]

## 2015-02-12 NOTE — Care Management Important Message (Signed)
Important Message  Patient Details  Name: Belinda Harper MRN: [id number] Date of Birth: [birth year]   Medicare Important Message Given:  Yes-third notification given    Camillo Flaming 02/12/2015, 11:45 AMImportant Message  Patient Details  Name: Belinda Harper MRN: [id number] Date of Birth: [birth year]   Medicare Important Message Given:  Yes-third notification given    Camillo Flaming 02/12/2015, 11:45 AM

## 2015-02-12 NOTE — Progress Notes (Signed)
No needs present at time of discharge. Velva Harman, RN, BSN, [place]   [phone]

## 2015-02-12 NOTE — Discharge Summary (Signed)
Physician Discharge Summary  Belinda Harper:[id number] DOB: 09/12/1940 DOA: 02/05/2015  PCP: [name], MD  Admit date: 02/05/2015 Discharge date: 02/12/2015  Time spent: 60 minutes  Recommendations for Outpatient Follow-up:  1. Follow-up with [name], MD in 1-2 weeks. On follow-up patient's left lower extremity cellulitis with need to be reassessed. Patient will need a basic metabolic profile done to follow-up on electrolytes and renal function.  Discharge Diagnoses:  Principal Problem:   Sepsis due to cellulitis [facility]) Active Problems:   Iron deficiency anemia   Constipation   UTI (urinary tract infection)   Left leg cellulitis   Discharge Condition: Stable and improved  Diet recommendation: Regular  Filed Weights   02/08/15 0500 02/09/15 0542 02/11/15 0550  Weight: 70.4 kg (155 lb 3.3 oz) 72.2 kg (159 lb 2.8 oz) 73.25 kg (161 lb 7.8 oz)    History of present illness:  Per Dr Daylene Posey is a 74 y.o. female with Past medical history of iron deficiency anemia, chronic constipation. The patient presented with complaints of pain in her left leg. Patient mentioned that since one day prior to admission,she has been having increasing episodes of pain in her left leg. She also had noted some increased redness of the left leg. There was some mild swelling of the left ankle as well. She complained of feeling fatigue and tired. She felt that she was dehydrated with her next and with that she went to see her PCP who recommended her to go to ER. At the time of admitting physician's evaluation patient denied any complaints of headache, dizziness, lightheadedness, cough, chest pain, shortness of breath, abdominal pain, nausea, vomiting, diarrhea, recent change in her medication. No recent travel, no recent procedures.  The patient was coming from home.  At her baseline ambulates without support And is independent for most of her ADL; manages her  medication on her own.   Hospital Course:  #1 sepsis secondary to left lower extremity cellulitis Secondary to left lower extremity cellulitis. Patient was noted to have a leukocytosis on admission. Patient was placed empirically on IV antibiotics. Patient was pancultured. Patient also noted to have a UTI which was positive for Escherichia coli. Patient's IV antibiotics were subsequently narrowed from IV vancomycin and IV Zosyn to IV vancomycin. Patient improved clinically. IV vancomycin was subsequently changed to IV Ancef. Patient's leukocytosis had resolved by day of discharge. Patient be discharged home on 6 more days of oral Augmentin and doxycycline to complete a full course of antibiotic therapy.   #2 left lower extremity cellulitis Patient had presented with a left lower cellulitis and initially placed empirically on IV vancomycin and IV Zosyn on admission. Patient on admission was noted to have a leukocytosis with a white count of 13.4. Patient's leukocytosis trended down and had resolved by day of discharge. Patient IV Zosyn was subsequently discontinued and patient was maintained on IV vancomycin. Patient had some clinical improvement. Patient was subsequently transitioned from IV vancomycin to IV Ancef with significant improvement in the left lower extremity. Patient will be discharged home on 6 more days of oral Augmentin and doxycycline to complete a course of antibiotic therapy. Outpatient follow-up.   #3 volume overload Likely secondary to aggressive IV fluid hydration. 2-D echo done 02/10/2015 with a EF of 60% with no wall motion abnormalities. Patient was noted also to be volume overloaded on examination. Patient was diuresed with IV Lasix with good diuresis and was negative approximately 2 L. Patient improved clinically and  was back to baseline by day of discharge.   #4 hypokalemia Secondary to diureses. Patient's potassium was repleted prior to discharge. Outpatient follow-up.  #5  iron deficiency anemia Patient was maintained on a home regimen of iron supplementation.   #6 Escherichia coli UTI On admission urinalysis which was done was consistent with a UTI. Patient was empirically placed on IV Zosyn. Urine cultures grew out Escherichia coli. Patient received a full course of antibiotic treatment.   #7 constipation Patient was placed on daily MiraLAX with resolution of her constipation. Patient be discharged on this bowel regimen.    Procedures:  Chest x-ray 02/08/2015  Plain films of the left tib-fib 02/05/2015  Consultations:  None  Discharge Exam: Filed Vitals:   02/12/15 1100  BP: 116/50  Pulse: 80  Temp: 98.3 F (36.8 C)  Resp: 18    General: NAD Cardiovascular: RRR Respiratory: CTAB Extremities: Left lower extremity with significant decrease in erythema, warmth. Less tender to palpation. Decreased edema.  Discharge Instructions   Discharge Instructions    Diet general    Complete by:  As directed      Discharge instructions    Complete by:  As directed   Follow up with [name], MD in 1-2 weeks.     Increase activity slowly    Complete by:  As directed           Current Discharge Medication List    START taking these medications   Details  amoxicillin-clavulanate (AUGMENTIN) 875-125 MG tablet Take 1 tablet by mouth 2 (two) times daily. Take for 6 days then stop. Qty: 12 tablet, Refills: 0    doxycycline (VIBRA-TABS) 100 MG tablet Take 1 tablet (100 mg total) by mouth 2 (two) times daily. Take for 6 days then stop. Qty: 12 tablet, Refills: 0    polyethylene glycol (MIRALAX / GLYCOLAX) packet Take 17 g by mouth daily. Hold if develops diarrhea. Qty: 14 each, Refills: 0      CONTINUE these medications which have NOT CHANGED   Details  Calcium Citrate-Vitamin D (CITRACAL + D PO) Take 500 mg by mouth daily. Has 250IU of vitamin D3 and 80mg  of magnesium    docusate sodium (COLACE) 100 MG capsule Take 100 mg by mouth  daily.    EPINEPHrine 0.3 mg/0.3 mL IJ SOAJ injection Inject 0.3 mLs (0.3 mg total) into the muscle once. Qty: 1 Device, Refills: 0    ferrous gluconate (FERGON) 240 (27 FE) MG tablet Take 1,440 mg by mouth at bedtime.    Glucos-Chondroit-Hyaluron-MSM (GLUCOSAMINE CHONDROITIN JOINT) TABS Take 1 tablet by mouth 2 (two) times daily.     KERYDIN 5 % SOLN Apply 1 application topically daily.    Menaquinone-7 (VITAMIN K2 PO) Take by mouth.    Multiple Vitamins-Minerals (ONE-A-DAY WOMENS VITACRAVES PO) Take 1 tablet by mouth daily.    vitamin C (ASCORBIC ACID) 500 MG tablet Take 500 mg by mouth daily.        Allergies  Allergen Reactions  . Other Other (See Comments)    Bee sting - blood pressure dropped, passed out  . Sulfa Antibiotics Rash  . Sulfamethoxazole Rash   Follow-up Information    Follow up with [name], MD. Schedule an appointment as soon as possible for a visit on 03/02/2015.   Specialties:  Internal Medicine, Pediatrics   Why:  Please follow up with Dr. Regis Bill on Monday, November 7th at 2:30pm   Contact information:   [place]  [phone]        The results of significant diagnostics from this hospitalization (including imaging, microbiology, ancillary and laboratory) are listed below for reference.    Significant Diagnostic Studies: Dg Chest 1 View  02/08/2015  CLINICAL DATA:  Dyspnea. No chest complaints. EXAM: CHEST 1 VIEW COMPARISON:  03/20/2009 FINDINGS: Convex right thoracic spine curvature. Midline trachea. Mild cardiomegaly. Probable small bilateral pleural effusions. No pneumothorax. Biapical pleural thickening. Interstitial prominence and indistinctness. Patchy right upper lobe airspace disease. Right infrahilar confluent opacity. Mild left base atelectasis. IMPRESSION: Patchy right upper an more confluent right infrahilar/lower lung opacity. Presuming the patient has infectious symptoms, this could  represent pneumonia. Centrally obstructing lesion with postobstructive pneumonitis is a secondary concern in the right infrahilar region. Bilateral pleural effusions with suspicion of mild pulmonary venous congestion. Consider PA and lateral radiographs for further characterization. Followup PA and lateral chest X-ray is also recommended in 3-4 weeks following trial of antibiotic therapy to ensure resolution and exclude underlying malignancy. Electronically Signed   By: Abigail Miyamoto M.D.   On: 02/08/2015 11:02   Dg Tibia/fibula Left  02/05/2015  CLINICAL DATA:  74 year old female with lower extremity redness and fever. Cellulitis. Initial encounter. EXAM: LEFT TIBIA AND FIBULA - 2 VIEW COMPARISON:  Tib-fib series 11 10/12/2008. FINDINGS: No knee joint effusion identified. Chronic medial joint space loss and degenerative spurring at the knee. Patella appears intact. Distal femur and proximal tibia and fibula appear intact. Distal tibia and fibula ORIF hardware following the 2010 fractures. The proximal fibula head fracture is healed. Hardware appears intact without evidence of loosening. Distal tibia and fibula appear intact. Visible calcaneus intact. No subcutaneous gas. No other radiopaque foreign body identified. IMPRESSION: Posttraumatic and postoperative changes to the left tibia and fibula. No acute osseous abnormality identified. No subcutaneous gas. Electronically Signed   By: Genevie Ann M.D.   On: 02/05/2015 19:19    Microbiology: Recent Results (from the past 240 hour(s))  Blood Culture (routine x 2)     Status: None   Collection Time: 02/05/15  5:30 PM  Result Value Ref Range Status   Specimen Description BLOOD LAC  Final   Special Requests BOTTLES DRAWN AEROBIC AND ANAEROBIC 7 CC  Final   Culture   Final    NO GROWTH 5 DAYS Performed at [facility]    Report Status 02/10/2015 FINAL  Final  Blood Culture (routine x 2)     Status: None   Collection Time: 02/05/15  5:30 PM  Result  Value Ref Range Status   Specimen Description BLOOD RIGHT ANTECUBITAL  Final   Special Requests BOTTLES DRAWN AEROBIC AND ANAEROBIC 5 CC  Final   Culture   Final    NO GROWTH 5 DAYS Performed at [facility]    Report Status 02/10/2015 FINAL  Final  Urine culture     Status: None   Collection Time: 02/05/15  6:32 PM  Result Value Ref Range Status   Specimen Description URINE, CLEAN CATCH  Final   Special Requests NONE  Final   Culture   Final    >=100,000 COLONIES/mL ESCHERICHIA COLI Performed at [facility]    Report Status 02/07/2015 FINAL  Final   Organism ID, Bacteria ESCHERICHIA COLI  Final      Susceptibility   Escherichia coli - MIC*    AMPICILLIN 8 SENSITIVE Sensitive     CEFAZOLIN <=4 SENSITIVE Sensitive     CEFTRIAXONE <=1 SENSITIVE Sensitive  CIPROFLOXACIN >=4 RESISTANT Resistant     GENTAMICIN <=1 SENSITIVE Sensitive     IMIPENEM <=0.25 SENSITIVE Sensitive     NITROFURANTOIN <=16 SENSITIVE Sensitive     TRIMETH/SULFA <=20 SENSITIVE Sensitive     AMPICILLIN/SULBACTAM 4 SENSITIVE Sensitive     PIP/TAZO <=4 SENSITIVE Sensitive     * >=100,000 COLONIES/mL ESCHERICHIA COLI     Labs: Basic Metabolic Panel:  Recent Labs Lab 02/05/15 1835 02/08/15 0734 02/09/15 1020 02/11/15 0932 02/12/15 0553  NA 135 139 140 141 141  K 3.5 3.2* 3.5 3.3* 3.2*  CL 100* 107 103 102 98*  CO2 26 26 32 32 33*  GLUCOSE 124* 106* 122* 103* 87  BUN 23* 9 10 10 15   CREATININE 0.86 0.65 0.62 0.63 0.68  CALCIUM 9.1 7.8* 8.7* 9.3 8.9  MG  --   --   --  2.2  --    Liver Function Tests:  Recent Labs Lab 02/05/15 1835  AST 32  ALT 18  ALKPHOS 63  BILITOT 0.8  PROT 7.5  ALBUMIN 3.9   No results for input(s): LIPASE, AMYLASE in the last 168 hours. No results for input(s): AMMONIA in the last 168 hours. CBC:  Recent Labs Lab 02/05/15 1835 02/08/15 0734 02/11/15 0932 02/12/15 0553  WBC 13.4* 7.5 4.4 4.0  NEUTROABS 12.4*  --   --   --   HGB 11.6* 10.0*  10.6* 9.9*  HCT 36.4 32.9* 34.1* 31.0*  MCV 87.7 89.6 88.8 87.3  PLT 156 128* 250 280   Cardiac Enzymes:  Recent Labs Lab 02/05/15 2237  CKTOTAL 96   BNP: BNP (last 3 results)  Recent Labs  02/08/15 0734  BNP 422.4*    ProBNP (last 3 results) No results for input(s): PROBNP in the last 8760 hours.  CBG: No results for input(s): GLUCAP in the last 168 hours.     SignedIrine Seal MD [facility] 02/12/2015, 1:07 PM

## 2015-02-16 ENCOUNTER — Telehealth: Payer: Self-pay | Admitting: Internal Medicine

## 2015-02-16 NOTE — Telephone Encounter (Signed)
Pt said the following med will run out on  Wednesday 02/18/15 doxycycline (VIBRA-TABS) 100 MG tablet ,  amoxicillin-clavulanate (AUGMENTIN) 875-125 MG tablet She is asking for a refill on the 2 meds.       Pharmacy; [facility] on [address]

## 2015-02-16 NOTE — Telephone Encounter (Signed)
Left a message for a return call.  Need reason for refill of these medications.  If still having symptoms than will need to speak to triage.

## 2015-02-17 ENCOUNTER — Ambulatory Visit (INDEPENDENT_AMBULATORY_CARE_PROVIDER_SITE_OTHER): Payer: Medicare Other | Admitting: Internal Medicine

## 2015-02-17 ENCOUNTER — Encounter: Payer: Self-pay | Admitting: Internal Medicine

## 2015-02-17 VITALS — BP 142/70 | HR 87 | Temp 98.7°F | Ht 67.0 in | Wt 151.1 lb

## 2015-02-17 DIAGNOSIS — L03116 Cellulitis of left lower limb: Secondary | ICD-10-CM | POA: Diagnosis not present

## 2015-02-17 DIAGNOSIS — D509 Iron deficiency anemia, unspecified: Secondary | ICD-10-CM

## 2015-02-17 DIAGNOSIS — Z9889 Other specified postprocedural states: Secondary | ICD-10-CM

## 2015-02-17 DIAGNOSIS — E876 Hypokalemia: Secondary | ICD-10-CM | POA: Diagnosis not present

## 2015-02-17 LAB — BASIC METABOLIC PANEL
BUN: 31 mg/dL — ABNORMAL HIGH (ref 6–23)
CO2: 35 mEq/L — ABNORMAL HIGH (ref 19–32)
Calcium: 9.7 mg/dL (ref 8.4–10.5)
Chloride: 99 mEq/L (ref 96–112)
Creatinine, Ser: 0.83 mg/dL (ref 0.40–1.20)
GFR: 71.61 mL/min (ref >60.00)
Glucose, Bld: 101 mg/dL — ABNORMAL HIGH (ref 70–99)
Potassium: 5.1 mEq/L (ref 3.5–5.1)
Sodium: 139 mEq/L (ref 135–145)

## 2015-02-17 LAB — CBC WITH DIFFERENTIAL/PLATELET
Basophils Absolute: 0 10*3/uL (ref 0.0–0.1)
Basophils Relative: 0.5 % (ref 0.0–3.0)
Eosinophils Absolute: 0.1 10*3/uL (ref 0.0–0.7)
Eosinophils Relative: 1.6 % (ref 0.0–5.0)
HCT: 31.3 % — ABNORMAL LOW (ref 36.0–46.0)
Hemoglobin: 10 g/dL — ABNORMAL LOW (ref 12.0–15.0)
Lymphocytes Relative: 26.7 % (ref 12.0–46.0)
Lymphs Abs: 1.6 10*3/uL (ref 0.7–4.0)
MCHC: 31.8 g/dL (ref 30.0–36.0)
MCV: 86.6 fl (ref 78.0–100.0)
Monocytes Absolute: 0.5 10*3/uL (ref 0.1–1.0)
Monocytes Relative: 8.2 % (ref 3.0–12.0)
Neutro Abs: 3.7 10*3/uL (ref 1.4–7.7)
Neutrophils Relative %: 63 % (ref 43.0–77.0)
Platelets: 460 10*3/uL — ABNORMAL HIGH (ref 150.0–400.0)
RBC: 3.61 Mil/uL — ABNORMAL LOW (ref 3.87–5.11)
RDW: 15.9 % — ABNORMAL HIGH (ref 11.5–15.5)
WBC: 5.8 10*3/uL (ref 4.0–10.5)

## 2015-02-17 LAB — C-REACTIVE PROTEIN: CRP: 3.2 mg/dL (ref 0.5–20.0)

## 2015-02-17 LAB — SEDIMENTATION RATE: Sed Rate: 109 mm/hr — ABNORMAL HIGH (ref 0–22)

## 2015-02-17 MED ORDER — AMOXICILLIN-POT CLAVULANATE 875-125 MG PO TABS: 1.0000 | ORAL_TABLET | Freq: Two times a day (BID) | ORAL | Status: DC

## 2015-02-17 MED ORDER — DOXYCYCLINE HYCLATE 100 MG PO TABS: 100.0000 mg | ORAL_TABLET | Freq: Two times a day (BID) | ORAL | Status: DC

## 2015-02-17 NOTE — Patient Instructions (Addendum)
I want you to see   Orthopedics  In the next days   We have a call into their office    And stay   on antibiotics for now.  Will notify you  of labs when available.

## 2015-02-17 NOTE — Telephone Encounter (Signed)
Left a message for a return call.

## 2015-02-17 NOTE — Progress Notes (Signed)
Pre visit review using our clinic review tool, if applicable. No additional management support is needed unless otherwise documented below in the visit note.   Chief Complaint  Patient presents with  . Cellulitis    HPI: Patient Belinda Harper  comes in today as a walk-in patient for SDA   new problem evaluation. Walk in patient   Work in   Just got dc from hospital  10 20 for cellulitis of her left lower extremity and UTI sepsis   treated with vancomycin and sent home with Augmentin and doxycycline which course will be completed in another day. An x-ray was done of her left ankle and she has pins and hardware in the ankle but was not felt to be actively infected. She also had some complications because of fluid overload iatrogenic. Is doing well now with this. She was walking better when she came home from the hospital but now has increasing pain over the lateral malleolar area with increasing swelling over the old incision site. Was concerned that her in a boxer going to run out and things would get worse again. No recent trauma.   She has a post hospital follow-up for next week planning to get repeat electrolytes and evaluation.  Below is her discharge summary abstract.  TRENIYA LOBB ZRA:[id number] DOB: 1941-01-27 DOA: 02/05/2015  PCP: [name], MD  Admit date: 02/05/2015 Discharge date: 02/12/2015  Time spent: 60 minutes  Recommendations for Outpatient Follow-up:  1. Follow-up with [name], MD in 1-2 weeks. On follow-up patient's left lower extremity cellulitis with need to be reassessed. Patient will need a basic metabolic profile done to follow-up on electrolytes and renal function.  Discharge Diagnoses:  Principal Problem:  Sepsis due to cellulitis [facility]) Active Problems:  Iron deficiency anemia  Constipation  UTI (urinary tract infection)  Left leg cellulitis   Discharge Condition: Stable and improved  Diet recommendation:  Regular  Filed Weights   02/08/15 0500 02/09/15 0542 02/11/15 0550        ROS: See pertinent positives and negatives per HPI.  Past Medical History  Diagnosis Date  . Unspecified vitamin D deficiency 04/06/2007  . HYPERCHOLESTEROLEMIA 05/19/2008  . 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 HEMORRHOIDS 05/19/2008  . ALLERGIC RHINITIS 01/03/2007  . ASTHMA 01/03/2007  . UTERINE PROLAPSE 04/06/2007  . OSTEOARTHRITIS 01/03/2007  . OSTEOPOROSIS 01/03/2007    hx of actonbel use for at least 5 years   . FRACTURE, ANKLE, LEFT 04/10/2009  . Blood transfusion 12 2010    hg 4 transfused 4 units  . Gastritis 2002  . DEPRESSION 01/03/2007    resolved  . Normal cardiac stress test     2014    Family History  Problem Relation Age of Onset  . Hyperlipidemia Mother   . Heart disease Mother     CABG age 53  . Ovarian cancer Mother   . Uterine cancer Mother   . Arthritis Mother   . Osteoporosis Mother   . Heart disease Father     CABG age 34  . Arthritis Father   . Melanoma Father     Social History   Social History  . Marital Status: Divorced    Spouse Name: N/A  . Number of Children: 2  . Years of Education: N/A   Social History Main Topics  . Smoking status: Never Smoker   . Smokeless tobacco: Never Used  . Alcohol Use: Yes  Comment: rare  . Drug Use: No  . Sexual Activity: Not Asked   Other Topics Concern  . None   Social History Narrative   HH of  1   To rent out room    Retired Dispensing optician   Never smoked    Pet cat allergic    Divorced   Regular exericise  walking mowing the lawnswimming   Silver sneakers        Outpatient Prescriptions Prior to Visit  Medication Sig Dispense Refill  . Calcium Citrate-Vitamin D (CITRACAL + D PO) Take 500 mg by mouth daily. Has 250IU of vitamin D3 and 80mg  of magnesium    . docusate sodium (COLACE) 100 MG capsule Take 100 mg by mouth daily.    [name]  EPINEPHrine 0.3 mg/0.3 mL IJ SOAJ injection Inject 0.3 mLs (0.3 mg total) into the muscle once. 1 Device 0  . ferrous gluconate (FERGON) 240 (27 FE) MG tablet Take 1,440 mg by mouth at bedtime.    . Glucos-Chondroit-Hyaluron-MSM (GLUCOSAMINE CHONDROITIN JOINT) TABS Take 1 tablet by mouth 2 (two) times daily.     [name] KERYDIN 5 % SOLN Apply 1 application topically daily.    . Menaquinone-7 (VITAMIN K2 PO) Take by mouth.    . Multiple Vitamins-Minerals (ONE-A-DAY WOMENS VITACRAVES PO) Take 1 tablet by mouth daily.    . polyethylene glycol (MIRALAX / GLYCOLAX) packet Take 17 g by mouth daily. Hold if develops diarrhea. 14 each 0  . vitamin C (ASCORBIC ACID) 500 MG tablet Take 500 mg by mouth daily.     [name] amoxicillin-clavulanate (AUGMENTIN) 875-125 MG tablet Take 1 tablet by mouth 2 (two) times daily. Take for 6 days then stop. 12 tablet 0  . doxycycline (VIBRA-TABS) 100 MG tablet Take 1 tablet (100 mg total) by mouth 2 (two) times daily. Take for 6 days then stop. 12 tablet 0   No facility-administered medications prior to visit.     EXAM:  BP 142/70 mmHg  Pulse 87  Temp(Src) 98.7 F (37.1 C) (Oral)  Ht 5\' 7"  (1.702 m)  Wt 151 lb 1.6 oz (68.539 kg)  BMI 23.66 kg/m2  SpO2 98%  Body mass index is 23.66 kg/(m^2).  GENERAL: vitals reviewed and listed above, alert, oriented, appears well hydrated and in no acute distress HEENT: atraumatic, conjunctiva  clear, no obvious abnormalities on inspection of external nose and earsMS:   walking with a cane left lower extremity distally edema +2 compared to right. There is fading redness around the ankle but induration over the scar line on the lateral malleolus. It is quite tender in this area. No streaking noted. PSYCH: pleasant and cooperative, no obvious depression or anxiety she appears cognitively intact.   ASSESSMENT AND PLAN:  Discussed the following assessment and plan:  Cellulitis of left lower extremity - localizing over area of previous  surgery. - Plan: Basic metabolic panel, CBC with Differential/Platelet, C-reactive protein, Sedimentation rate  H/O foot ankle surgery with hardware - 2010 sp fracture  Iron deficiency anemia - Plan: Basic metabolic panel, CBC with Differential/Platelet, C-reactive protein, Sedimentation rate  Hypokalemia - Plan: Basic metabolic panel, CBC with Differential/Platelet, C-reactive protein, Sedimentation rate  post hospital treatment for cellulitis sepsis criteria and UTI. Localizing symptoms around the left ankle surgical site from 2010. This certainly may be superficial and there may be abscess formation causing this but this is increasing pain compared to when she was out of the hospital. Continue the oral antibiotics and get an urgent (  this week)  orthopedic consult. Called  [facility] orthopedics waiting on a callback for them to see her this week. Fortunately she doesn't have fever or systemic symptoms at this time. Her original x-ray was done on admission on October 13 and showed no bony infection or hardware loosening .   -Patient advised to return or notify health care team  if symptoms worsen ,persist or new concerns arise. Total visit 40 mins > 50% spent counseling and coordinating care as indicated in above note and in instructions to patient .     Patient Instructions  I want you to see   Orthopedics  In the next days   We have a call into their office    And stay   on antibiotics for now.  Will notify you  of labs when available.         Standley Brooking. Karmen Altamirano M.D.

## 2015-02-17 NOTE — Telephone Encounter (Signed)
Pt seen in [place] today.  Medication extended

## 2015-02-18 ENCOUNTER — Emergency Department (HOSPITAL_COMMUNITY)
Admission: EM | Admit: 2015-02-18 | Discharge: 2015-02-19 | Disposition: A | Discharge status: Home / Self Care | Payer: Medicare Other | Source: Home / Self Care | Attending: Emergency Medicine | Admitting: Emergency Medicine

## 2015-02-18 ENCOUNTER — Encounter (HOSPITAL_COMMUNITY): Payer: Self-pay | Admitting: Emergency Medicine

## 2015-02-18 ENCOUNTER — Emergency Department (HOSPITAL_COMMUNITY): Payer: Medicare Other

## 2015-02-18 DIAGNOSIS — L0390 Cellulitis, unspecified: Secondary | ICD-10-CM

## 2015-02-18 LAB — CBC WITH DIFFERENTIAL/PLATELET
Basophils Absolute: 0 10*3/uL (ref 0.0–0.1)
Basophils Relative: 1 %
Eosinophils Absolute: 0.1 10*3/uL (ref 0.0–0.7)
Eosinophils Relative: 3 %
HCT: 30.4 % — ABNORMAL LOW (ref 36.0–46.0)
Hemoglobin: 9.3 g/dL — ABNORMAL LOW (ref 12.0–15.0)
Lymphocytes Relative: 36 %
Lymphs Abs: 1.5 10*3/uL (ref 0.7–4.0)
MCH: 27.4 pg (ref 26.0–34.0)
MCHC: 30.6 g/dL (ref 30.0–36.0)
MCV: 89.4 fL (ref 78.0–100.0)
Monocytes Absolute: 0.4 10*3/uL (ref 0.1–1.0)
Monocytes Relative: 9 %
Neutro Abs: 2.3 10*3/uL (ref 1.7–7.7)
Neutrophils Relative %: 51 %
Platelets: 469 10*3/uL — ABNORMAL HIGH (ref 150–400)
RBC: 3.4 MIL/uL — ABNORMAL LOW (ref 3.87–5.11)
RDW: 15.3 % (ref 11.5–15.5)
WBC: 4.3 10*3/uL (ref 4.0–10.5)

## 2015-02-18 LAB — COMPREHENSIVE METABOLIC PANEL
ALT: 12 U/L — ABNORMAL LOW (ref 14–54)
AST: 22 U/L (ref 15–41)
Albumin: 3.5 g/dL (ref 3.5–5.0)
Alkaline Phosphatase: 76 U/L (ref 38–126)
Anion gap: 7 (ref 5–15)
BUN: 29 mg/dL — ABNORMAL HIGH (ref 6–20)
CO2: 31 mmol/L (ref 22–32)
Calcium: 9.4 mg/dL (ref 8.9–10.3)
Chloride: 99 mmol/L — ABNORMAL LOW (ref 101–111)
Creatinine, Ser: 0.7 mg/dL (ref 0.44–1.00)
GFR calc Af Amer: >60 mL/min (ref >60)
GFR calc non Af Amer: >60 mL/min (ref >60)
Glucose, Bld: 100 mg/dL — ABNORMAL HIGH (ref 65–99)
Potassium: 4 mmol/L (ref 3.5–5.1)
Sodium: 137 mmol/L (ref 135–145)
Total Bilirubin: 0.3 mg/dL (ref 0.3–1.2)
Total Protein: 8.1 g/dL (ref 6.5–8.1)

## 2015-02-18 LAB — SEDIMENTATION RATE: Sed Rate: 100 mm/hr — ABNORMAL HIGH (ref 0–22)

## 2015-02-18 LAB — I-STAT CG4 LACTIC ACID, ED
Lactic Acid, Venous: 0.6 mmol/L (ref 0.5–2.0)
Lactic Acid, Venous: 0.59 mmol/L (ref 0.5–2.0)

## 2015-02-18 MED ORDER — GADOBENATE DIMEGLUMINE 529 MG/ML IV SOLN
15.0000 mL | Freq: Once | INTRAVENOUS | Status: AC | PRN
  Administered 2015-02-18: 13 mL via INTRAVENOUS

## 2015-02-18 MED ORDER — VANCOMYCIN HCL 10 G IV SOLR
1250.0000 mg | Freq: Once | INTRAVENOUS | Status: AC
  Administered 2015-02-18: 1250 mg via INTRAVENOUS
  Filled 2015-02-18: qty 1250

## 2015-02-18 NOTE — ED Provider Notes (Signed)
CSN: [id number]     Arrival date & time 02/18/15  1709 History   First MD Initiated Contact with Patient 02/18/15 1938     Chief Complaint  Patient presents with  . Bone Infection      (Consider location/radiation/quality/duration/timing/severity/associated sxs/prior Treatment) HPI Comments: Patient here with concerns of osteomyelitis. She had cellulitis of her left lower extremity and was minimal hospital. She's been on outpatient Augmentin and doxycycline with improvement, however the pain became worse over the left ankle. She does have hardware in the left ankle from a previous break. She went to see orthopedics today and they took x-rays and are concerned about infection. She was sent here for further evaluation.  Patient is a 74 y.o. female presenting with rash. The history is provided by the patient.  Rash Location:  Leg Leg rash location:  L ankle Quality: redness   Severity:  Moderate Onset quality:  Gradual Timing:  Constant Progression:  Worsening Chronicity:  Recurrent Relieved by:  Nothing Worsened by:  Nothing tried Associated symptoms: no abdominal pain     Past Medical History  Diagnosis Date  . Unspecified vitamin D deficiency 04/06/2007  . HYPERCHOLESTEROLEMIA 05/19/2008  . 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 HEMORRHOIDS 05/19/2008  . ALLERGIC RHINITIS 01/03/2007  . ASTHMA 01/03/2007  . UTERINE PROLAPSE 04/06/2007  . OSTEOARTHRITIS 01/03/2007  . OSTEOPOROSIS 01/03/2007    hx of actonbel use for at least 5 years   . FRACTURE, ANKLE, LEFT 04/10/2009  . Blood transfusion 12 2010    hg 4 transfused 4 units  . Gastritis 2002  . DEPRESSION 01/03/2007    resolved  . Normal cardiac stress test     2014   Past Surgical History  Procedure Laterality Date  . Panendoscopy      small bowel bx  . Ankle fracture surgery  02/2009    left ankle fracture and dislocation  . Cataract extraction w/ intraocular  lens implant  2000    right eye  . Orif finger / thumb fracture  1970    reattached  . Mass excision Left 09/11/2012    Procedure: 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; Rotation Flap;  Surgeon: Wynonia Sours, MD;  Location: [facility];  Service: Orthopedics;  Laterality: Left;   Family History  Problem Relation Age of Onset  . Hyperlipidemia Mother   . Heart disease Mother     CABG age 25  . Ovarian cancer Mother   . Uterine cancer Mother   . Arthritis Mother   . Osteoporosis Mother   . Heart disease Father     CABG age 56  . Arthritis Father   . Melanoma Father    Social History  Substance Use Topics  . Smoking status: Never Smoker   . Smokeless tobacco: Never Used  . Alcohol Use: Yes     Comment: rare   OB History    No data available     Review of Systems  Gastrointestinal: Negative for abdominal pain.  Skin: Positive for rash.  All other systems reviewed and are negative.     Allergies  Other; Sulfa antibiotics; and Sulfamethoxazole  Home Medications   Prior to Admission medications   Medication Sig Start Date End Date Taking? Authorizing Provider  amoxicillin-clavulanate (AUGMENTIN) 875-125 MG tablet Take 1 tablet by mouth 2 (two) times daily. 02/17/15 02/23/15  Burnis Medin, MD  Calcium Citrate-Vitamin D (CITRACAL + D  PO) Take 500 mg by mouth daily. Has 250IU of vitamin D3 and 80mg  of magnesium    Historical Provider, MD  docusate sodium (COLACE) 100 MG capsule Take 100 mg by mouth daily.    Historical Provider, MD  doxycycline (VIBRA-TABS) 100 MG tablet Take 1 tablet (100 mg total) by mouth 2 (two) times daily. 02/17/15 02/23/15  Burnis Medin, MD  EPINEPHrine 0.3 mg/0.3 mL IJ SOAJ injection Inject 0.3 mLs (0.3 mg total) into the muscle once. 01/27/14   Burnis Medin, MD  ferrous gluconate (FERGON) 240 (27 FE) MG tablet Take 1,440 mg by mouth at bedtime.    Historical Provider, MD  Glucos-Chondroit-Hyaluron-MSM  (GLUCOSAMINE CHONDROITIN JOINT) TABS Take 1 tablet by mouth 2 (two) times daily.     Historical Provider, MD  KERYDIN 5 % SOLN Apply 1 application topically daily. 12/23/14   Historical Provider, MD  Menaquinone-7 (VITAMIN K2 PO) Take by mouth.    Historical Provider, MD  Multiple Vitamins-Minerals (ONE-A-DAY WOMENS VITACRAVES PO) Take 1 tablet by mouth daily.    Historical Provider, MD  polyethylene glycol (MIRALAX / GLYCOLAX) packet Take 17 g by mouth daily. Hold if develops diarrhea. 02/12/15   Eugenie Filler, MD  vitamin C (ASCORBIC ACID) 500 MG tablet Take 500 mg by mouth daily.     Historical Provider, MD   BP 132/55 mmHg  Pulse 78  Temp(Src) 98.1 F (36.7 C) (Oral)  Resp 14  SpO2 98% Physical Exam  Constitutional: She is oriented to person, place, and time. She appears well-developed and well-nourished. No distress.  HENT:  Head: Normocephalic and atraumatic.  Mouth/Throat: Oropharynx is clear and moist.  Eyes: EOM are normal. Pupils are equal, round, and reactive to light.  Neck: Normal range of motion. Neck supple.  Cardiovascular: Normal rate and regular rhythm.  Exam reveals no friction rub.   No murmur heard. Pulmonary/Chest: Effort normal and breath sounds normal. No respiratory distress. She has no wheezes. She has no rales.  Abdominal: Soft. She exhibits no distension. There is no tenderness. There is no rebound.  Musculoskeletal: Normal range of motion. She exhibits no edema.       Legs: Neurological: She is alert and oriented to person, place, and time.  Skin: She is not diaphoretic.  Nursing note and vitals reviewed.   ED Course  Procedures (including critical care time) Labs Review Labs Reviewed  COMPREHENSIVE METABOLIC PANEL - Abnormal; Notable for the following:    Chloride 99 (*)    Glucose, Bld 100 (*)    BUN 29 (*)    ALT 12 (*)    All other components within normal limits  CBC WITH DIFFERENTIAL/PLATELET - Abnormal; Notable for the following:    RBC  3.40 (*)    Hemoglobin 9.3 (*)    HCT 30.4 (*)    Platelets 469 (*)    All other components within normal limits  CULTURE, BLOOD (ROUTINE X 2)  CULTURE, BLOOD (ROUTINE X 2)  SEDIMENTATION RATE  I-STAT CG4 LACTIC ACID, ED    Imaging Review No results found. I have personally reviewed and evaluated these images and lab results as part of my medical decision-making.   EKG Interpretation None      MDM   Final diagnoses:  Cellulitis    74 year old female here with concerns of osteomyelitis. Will obtain an MRI with contrast of her left ankle to look for osteomyelitis definitively. MR negative here on wet read, full read in the morning. Labs  ok. Sed rate is elevated, however without osteo on MR, I do not feel she needs to be admitted. Vitals are ok, she is taking Augmentin and doxycycline. Cellulitis is improving. I spoke with Dr. Alvan Dame since she came from Dr. Peri Maris office, he agreed with plan. She is comfortable following up with Dr. Maureen Ralphs in a day or two.  Stable for discharge. She did receive a dose of Vancomycin and have cultures drawn.   Evelina Bucy, MD 02/18/15 [phone]

## 2015-02-18 NOTE — ED Notes (Signed)
Pt states she was recently discharged after being admitted for cellulitis.  Pt states that she was told to come to the ER today to be "directly admitted" for a bone infection.  Pt states that she has a CD of her imaging with her.

## 2015-02-18 NOTE — ED Notes (Signed)
UNABLE TO COLLECT LABS AT THIS TIME PATIENT IS OUT OF ROOM.

## 2015-02-18 NOTE — Discharge Instructions (Signed)

## 2015-02-18 NOTE — ED Notes (Signed)
Pt. In MRI.

## 2015-02-18 NOTE — ED Notes (Signed)
Dr. Anne Fu office called to inform us on patient's arrival to ED that the Hospitalist need to be consulted to admit patient to the hospital. The PA working with Dr. Wynelle Link requested the Hospitalist then consult them on the case. Informed ER physician of this at this time.

## 2015-02-20 ENCOUNTER — Ambulatory Visit (HOSPITAL_COMMUNITY)
Admission: RE | Admit: 2015-02-20 | Discharge: 2015-02-20 | Disposition: A | Discharge status: Home / Self Care | Payer: Medicare Other | Source: Ambulatory Visit | Attending: Anesthesiology | Admitting: Anesthesiology

## 2015-02-20 ENCOUNTER — Encounter (HOSPITAL_COMMUNITY)
Admission: RE | Admit: 2015-02-20 | Discharge: 2015-02-20 | Disposition: A | Discharge status: Home / Self Care | Payer: Medicare Other | Source: Ambulatory Visit | Attending: Orthopedic Surgery | Admitting: Orthopedic Surgery

## 2015-02-20 ENCOUNTER — Ambulatory Visit: Payer: Self-pay | Admitting: Orthopedic Surgery

## 2015-02-20 ENCOUNTER — Encounter (HOSPITAL_COMMUNITY): Payer: Self-pay | Admitting: *Deleted

## 2015-02-20 DIAGNOSIS — Z01818 Encounter for other preprocedural examination: Secondary | ICD-10-CM

## 2015-02-20 LAB — SURGICAL PCR SCREEN
MRSA, PCR: NEGATIVE
Staphylococcus aureus: NEGATIVE

## 2015-02-20 NOTE — Progress Notes (Signed)
Preoperative surgical orders have been place into the Epic hospital system for Belinda Harper on 02/20/2015, 10:04 AM  by Mickel Crow for surgery on 02-20-2018.  Preop Knee Scope orders including IV Tylenol and IV Decadron as long as there are no contraindications to the above medications. Arlee Muslim, PA-C

## 2015-02-20 NOTE — Progress Notes (Signed)
Spoke with Dr Landry Dyke ( anesthesia) regarding CXR results of 02/08/15.  Anesthesia made aware of recent hospitalization for cellulitis and dehydration.  Patient had followup with PCP- on 02/17/2015.  Made aware of labs done on 02/18/15.  Will call patient and have CXR repeated on 02/20/2015 prior to surgery on 02/21/2015 per order of anesthesia.

## 2015-02-20 NOTE — Progress Notes (Signed)
Patient came in and had repeat 2V CXR done along with pcr screen.  Patient also signed consent for surgery.

## 2015-02-21 ENCOUNTER — Encounter (HOSPITAL_COMMUNITY): Payer: Self-pay | Admitting: Anesthesiology

## 2015-02-21 ENCOUNTER — Inpatient Hospital Stay (HOSPITAL_COMMUNITY)
Admission: RE | Admit: 2015-02-21 | Discharge: 2015-02-23 | DRG: 464 | Disposition: A | Discharge status: Home Health | Payer: Medicare Other | Source: Ambulatory Visit | Attending: Orthopedic Surgery | Admitting: Orthopedic Surgery

## 2015-02-21 ENCOUNTER — Encounter (HOSPITAL_COMMUNITY)
Admission: RE | Disposition: A | Discharge status: Home Health | Payer: Self-pay | Source: Ambulatory Visit | Attending: Orthopedic Surgery

## 2015-02-21 ENCOUNTER — Ambulatory Visit (HOSPITAL_COMMUNITY): Payer: Medicare Other | Admitting: Anesthesiology

## 2015-02-21 DIAGNOSIS — Z8781 Personal history of (healed) traumatic fracture: Secondary | ICD-10-CM | POA: Diagnosis not present

## 2015-02-21 DIAGNOSIS — M1990 Unspecified osteoarthritis, unspecified site: Secondary | ICD-10-CM | POA: Diagnosis present

## 2015-02-21 DIAGNOSIS — Z8041 Family history of malignant neoplasm of ovary: Secondary | ICD-10-CM | POA: Diagnosis not present

## 2015-02-21 DIAGNOSIS — L03116 Cellulitis of left lower limb: Secondary | ICD-10-CM | POA: Diagnosis present

## 2015-02-21 DIAGNOSIS — Z882 Allergy status to sulfonamides status: Secondary | ICD-10-CM

## 2015-02-21 DIAGNOSIS — J45909 Unspecified asthma, uncomplicated: Secondary | ICD-10-CM | POA: Diagnosis present

## 2015-02-21 DIAGNOSIS — Z79899 Other long term (current) drug therapy: Secondary | ICD-10-CM | POA: Diagnosis not present

## 2015-02-21 DIAGNOSIS — E7800 Pure hypercholesterolemia, unspecified: Secondary | ICD-10-CM | POA: Diagnosis present

## 2015-02-21 DIAGNOSIS — Z8249 Family history of ischemic heart disease and other diseases of the circulatory system: Secondary | ICD-10-CM | POA: Diagnosis not present

## 2015-02-21 DIAGNOSIS — F419 Anxiety disorder, unspecified: Secondary | ICD-10-CM | POA: Diagnosis present

## 2015-02-21 DIAGNOSIS — E559 Vitamin D deficiency, unspecified: Secondary | ICD-10-CM | POA: Diagnosis present

## 2015-02-21 DIAGNOSIS — Y831 Surgical operation with implant of artificial internal device as the cause of abnormal reaction of the patient, or of later complication, without mention of misadventure at the time of the procedure: Secondary | ICD-10-CM | POA: Diagnosis present

## 2015-02-21 DIAGNOSIS — T8469XA Infection and inflammatory reaction due to internal fixation device of other site, initial encounter: Secondary | ICD-10-CM | POA: Diagnosis present

## 2015-02-21 DIAGNOSIS — K219 Gastro-esophageal reflux disease without esophagitis: Secondary | ICD-10-CM | POA: Diagnosis present

## 2015-02-21 DIAGNOSIS — D509 Iron deficiency anemia, unspecified: Secondary | ICD-10-CM | POA: Diagnosis present

## 2015-02-21 DIAGNOSIS — F329 Major depressive disorder, single episode, unspecified: Secondary | ICD-10-CM | POA: Diagnosis present

## 2015-02-21 DIAGNOSIS — Z91030 Bee allergy status: Secondary | ICD-10-CM | POA: Diagnosis not present

## 2015-02-21 DIAGNOSIS — Z808 Family history of malignant neoplasm of other organs or systems: Secondary | ICD-10-CM | POA: Diagnosis not present

## 2015-02-21 DIAGNOSIS — M810 Age-related osteoporosis without current pathological fracture: Secondary | ICD-10-CM | POA: Diagnosis present

## 2015-02-21 DIAGNOSIS — M7989 Other specified soft tissue disorders: Secondary | ICD-10-CM | POA: Diagnosis present

## 2015-02-21 DIAGNOSIS — Z8049 Family history of malignant neoplasm of other genital organs: Secondary | ICD-10-CM

## 2015-02-21 HISTORY — PX: HARDWARE REMOVAL: SHX979

## 2015-02-21 HISTORY — DX: Family history of other specified conditions: Z84.89

## 2015-02-21 HISTORY — DX: Anxiety disorder, unspecified: F41.9

## 2015-02-21 HISTORY — DX: Gastro-esophageal reflux disease without esophagitis: K21.9

## 2015-02-21 SURGERY — REMOVAL, HARDWARE
Anesthesia: General | Site: Ankle | Laterality: Left

## 2015-02-21 MED ORDER — SODIUM CHLORIDE 0.9 % IV SOLN: INTRAVENOUS | Status: DC

## 2015-02-21 MED ORDER — BISACODYL 10 MG RE SUPP: 10.0000 mg | Freq: Every day | RECTAL | Status: DC | PRN

## 2015-02-21 MED ORDER — MIDAZOLAM HCL 5 MG/5ML IJ SOLN
INTRAMUSCULAR | Status: DC | PRN
Start: 2015-02-21 — End: 2015-02-21
  Administered 2015-02-21: 2 mg via INTRAVENOUS

## 2015-02-21 MED ORDER — DOCUSATE SODIUM 100 MG PO CAPS
100.0000 mg | ORAL_CAPSULE | Freq: Every evening | ORAL | Status: DC
  Administered 2015-02-21 – 2015-02-22 (×2): 100 mg via ORAL

## 2015-02-21 MED ORDER — ACETAMINOPHEN 500 MG PO TABS
1000.0000 mg | ORAL_TABLET | Freq: Four times a day (QID) | ORAL | Status: AC
  Administered 2015-02-21 – 2015-02-22 (×3): 1000 mg via ORAL
  Filled 2015-02-21 (×3): qty 2

## 2015-02-21 MED ORDER — ONDANSETRON HCL 4 MG PO TABS: 4.0000 mg | ORAL_TABLET | Freq: Four times a day (QID) | ORAL | Status: DC | PRN

## 2015-02-21 MED ORDER — FENTANYL CITRATE (PF) 250 MCG/5ML IJ SOLN
INTRAMUSCULAR | Status: AC
  Filled 2015-02-21: qty 25

## 2015-02-21 MED ORDER — METHOCARBAMOL 1000 MG/10ML IJ SOLN
500.0000 mg | Freq: Four times a day (QID) | INTRAVENOUS | Status: DC | PRN
  Filled 2015-02-21: qty 5

## 2015-02-21 MED ORDER — ONDANSETRON HCL 4 MG/2ML IJ SOLN
INTRAMUSCULAR | Status: DC | PRN
  Administered 2015-02-21: 4 mg via INTRAVENOUS

## 2015-02-21 MED ORDER — PROMETHAZINE HCL 25 MG/ML IJ SOLN: 6.2500 mg | INTRAMUSCULAR | Status: DC | PRN

## 2015-02-21 MED ORDER — ONDANSETRON HCL 4 MG/2ML IJ SOLN
4.0000 mg | Freq: Four times a day (QID) | INTRAMUSCULAR | Status: DC | PRN
  Administered 2015-02-21: 4 mg via INTRAVENOUS
  Filled 2015-02-21: qty 2

## 2015-02-21 MED ORDER — HYDROMORPHONE HCL 1 MG/ML IJ SOLN
INTRAMUSCULAR | Status: AC
  Filled 2015-02-21: qty 1

## 2015-02-21 MED ORDER — METOCLOPRAMIDE HCL 5 MG/ML IJ SOLN: 5.0000 mg | Freq: Three times a day (TID) | INTRAMUSCULAR | Status: DC | PRN

## 2015-02-21 MED ORDER — ONDANSETRON HCL 4 MG/2ML IJ SOLN
INTRAMUSCULAR | Status: AC
  Filled 2015-02-21: qty 2

## 2015-02-21 MED ORDER — LIDOCAINE HCL (CARDIAC) 20 MG/ML IV SOLN
INTRAVENOUS | Status: DC | PRN
  Administered 2015-02-21: 50 mg via INTRAVENOUS

## 2015-02-21 MED ORDER — CEFAZOLIN SODIUM-DEXTROSE 2-3 GM-% IV SOLR
2.0000 g | Freq: Four times a day (QID) | INTRAVENOUS | Status: DC
  Administered 2015-02-21 – 2015-02-23 (×8): 2 g via INTRAVENOUS
  Filled 2015-02-21 (×9): qty 50

## 2015-02-21 MED ORDER — SUCCINYLCHOLINE CHLORIDE 20 MG/ML IJ SOLN
INTRAMUSCULAR | Status: DC | PRN
  Administered 2015-02-21 (×2): 100 mg via INTRAVENOUS

## 2015-02-21 MED ORDER — PROMETHAZINE HCL 25 MG/ML IJ SOLN
6.2500 mg | Freq: Four times a day (QID) | INTRAMUSCULAR | Status: DC | PRN
  Administered 2015-02-21: 6.25 mg via INTRAVENOUS
  Filled 2015-02-21: qty 1

## 2015-02-21 MED ORDER — TRAMADOL HCL 50 MG PO TABS: 50.0000 mg | ORAL_TABLET | Freq: Four times a day (QID) | ORAL | Status: DC | PRN

## 2015-02-21 MED ORDER — DEXAMETHASONE SODIUM PHOSPHATE 10 MG/ML IJ SOLN
10.0000 mg | Freq: Once | INTRAMUSCULAR | Status: AC
  Administered 2015-02-21: 10 mg via INTRAVENOUS

## 2015-02-21 MED ORDER — ACETAMINOPHEN 325 MG PO TABS
650.0000 mg | ORAL_TABLET | Freq: Four times a day (QID) | ORAL | Status: DC | PRN
  Administered 2015-02-22: 650 mg via ORAL
  Filled 2015-02-21: qty 2

## 2015-02-21 MED ORDER — HYDROMORPHONE HCL 1 MG/ML IJ SOLN
0.2500 mg | INTRAMUSCULAR | Status: DC | PRN
  Administered 2015-02-21 (×2): 0.5 mg via INTRAVENOUS

## 2015-02-21 MED ORDER — OXYCODONE HCL 5 MG PO TABS: 5.0000 mg | ORAL_TABLET | ORAL | Status: DC | PRN

## 2015-02-21 MED ORDER — ACETAMINOPHEN 10 MG/ML IV SOLN
1000.0000 mg | Freq: Once | INTRAVENOUS | Status: AC
  Administered 2015-02-21: 1000 mg via INTRAVENOUS
  Filled 2015-02-21: qty 100

## 2015-02-21 MED ORDER — ACETAMINOPHEN 650 MG RE SUPP: 650.0000 mg | Freq: Four times a day (QID) | RECTAL | Status: DC | PRN

## 2015-02-21 MED ORDER — PROPOFOL 10 MG/ML IV BOLUS
INTRAVENOUS | Status: AC
  Filled 2015-02-21: qty 20

## 2015-02-21 MED ORDER — METHOCARBAMOL 500 MG PO TABS: 500.0000 mg | ORAL_TABLET | Freq: Four times a day (QID) | ORAL | Status: DC | PRN

## 2015-02-21 MED ORDER — 0.9 % SODIUM CHLORIDE (POUR BTL) OPTIME
TOPICAL | Status: DC | PRN
  Administered 2015-02-21: 1000 mL

## 2015-02-21 MED ORDER — PHENYLEPHRINE HCL 10 MG/ML IJ SOLN
INTRAMUSCULAR | Status: DC | PRN
Start: 2015-02-21 — End: 2015-02-21
  Administered 2015-02-21 (×2): 80 ug via INTRAVENOUS

## 2015-02-21 MED ORDER — FLEET ENEMA 7-19 GM/118ML RE ENEM: 1.0000 | ENEMA | Freq: Once | RECTAL | Status: DC | PRN

## 2015-02-21 MED ORDER — LACTATED RINGERS IV SOLN
INTRAVENOUS | Status: DC | PRN
  Administered 2015-02-21 (×2): via INTRAVENOUS

## 2015-02-21 MED ORDER — MIDAZOLAM HCL 2 MG/2ML IJ SOLN
INTRAMUSCULAR | Status: AC
  Filled 2015-02-21: qty 4

## 2015-02-21 MED ORDER — ENOXAPARIN SODIUM 40 MG/0.4ML ~~LOC~~ SOLN
40.0000 mg | SUBCUTANEOUS | Status: DC
  Administered 2015-02-22 – 2015-02-23 (×2): 40 mg via SUBCUTANEOUS
  Filled 2015-02-21 (×3): qty 0.4

## 2015-02-21 MED ORDER — ACETAMINOPHEN 10 MG/ML IV SOLN
INTRAVENOUS | Status: AC
  Filled 2015-02-21: qty 100

## 2015-02-21 MED ORDER — CEFAZOLIN SODIUM-DEXTROSE 2-3 GM-% IV SOLR
INTRAVENOUS | Status: AC
  Filled 2015-02-21: qty 50

## 2015-02-21 MED ORDER — PROPOFOL 10 MG/ML IV BOLUS
INTRAVENOUS | Status: DC | PRN
  Administered 2015-02-21: 150 mg via INTRAVENOUS
  Administered 2015-02-21: 100 mg via INTRAVENOUS

## 2015-02-21 MED ORDER — FERROUS GLUCONATE 324 (38 FE) MG PO TABS
1350.0000 mg | ORAL_TABLET | Freq: Every evening | ORAL | Status: DC
  Filled 2015-02-21 (×2): qty 4

## 2015-02-21 MED ORDER — PROMETHAZINE HCL 25 MG PO TABS: 12.5000 mg | ORAL_TABLET | Freq: Four times a day (QID) | ORAL | Status: DC | PRN

## 2015-02-21 MED ORDER — ROCURONIUM BROMIDE 100 MG/10ML IV SOLN
INTRAVENOUS | Status: AC
Start: 2015-02-21 — End: 2015-02-21
  Filled 2015-02-21: qty 1

## 2015-02-21 MED ORDER — MORPHINE SULFATE (PF) 2 MG/ML IV SOLN
1.0000 mg | INTRAVENOUS | Status: DC | PRN
  Administered 2015-02-21: 1 mg via INTRAVENOUS
  Filled 2015-02-21: qty 1

## 2015-02-21 MED ORDER — EPHEDRINE SULFATE 50 MG/ML IJ SOLN
INTRAMUSCULAR | Status: DC | PRN
  Administered 2015-02-21 (×2): 10 mg via INTRAVENOUS
  Administered 2015-02-21: 5 mg via INTRAVENOUS
  Administered 2015-02-21: 10 mg via INTRAVENOUS

## 2015-02-21 MED ORDER — SODIUM CHLORIDE 0.9 % IV SOLN
INTRAVENOUS | Status: DC
  Administered 2015-02-21: 17:00:00 via INTRAVENOUS
  Administered 2015-02-22: 20 mL/h via INTRAVENOUS

## 2015-02-21 MED ORDER — CHLORHEXIDINE GLUCONATE 4 % EX LIQD
60.0000 mL | Freq: Once | CUTANEOUS | Status: DC
Start: 2015-02-21 — End: 2015-02-21

## 2015-02-21 MED ORDER — SODIUM CHLORIDE 0.9 % IR SOLN
Status: DC | PRN
  Administered 2015-02-21: 3000 mL

## 2015-02-21 MED ORDER — FENTANYL CITRATE (PF) 100 MCG/2ML IJ SOLN
INTRAMUSCULAR | Status: DC | PRN
  Administered 2015-02-21 (×2): 25 ug via INTRAVENOUS
  Administered 2015-02-21 (×2): 50 ug via INTRAVENOUS
  Administered 2015-02-21: 100 ug via INTRAVENOUS

## 2015-02-21 MED ORDER — LIDOCAINE HCL (CARDIAC) 20 MG/ML IV SOLN
INTRAVENOUS | Status: AC
  Filled 2015-02-21: qty 10

## 2015-02-21 MED ORDER — POLYETHYLENE GLYCOL 3350 17 G PO PACK
17.0000 g | PACK | Freq: Every day | ORAL | Status: DC | PRN
Start: 2015-02-21 — End: 2015-02-23

## 2015-02-21 MED ORDER — CEFAZOLIN SODIUM-DEXTROSE 2-3 GM-% IV SOLR
2.0000 g | INTRAVENOUS | Status: AC
  Administered 2015-02-21: 2 g via INTRAVENOUS

## 2015-02-21 MED ORDER — METOCLOPRAMIDE HCL 10 MG PO TABS: 5.0000 mg | ORAL_TABLET | Freq: Three times a day (TID) | ORAL | Status: DC | PRN

## 2015-02-21 SURGICAL SUPPLY — 40 items
BANDAGE ELASTIC 6 VELCRO ST LF (GAUZE/BANDAGES/DRESSINGS) ×2 IMPLANT
BANDAGE ESMARK 6X9 LF (GAUZE/BANDAGES/DRESSINGS) ×1 IMPLANT
BNDG CMPR 9X6 STRL LF SNTH (GAUZE/BANDAGES/DRESSINGS) ×1
BNDG ESMARK 6X9 LF (GAUZE/BANDAGES/DRESSINGS) ×2
CUFF TOURN SGL QUICK 18 (TOURNIQUET CUFF) IMPLANT
CUFF TOURN SGL QUICK 34 (TOURNIQUET CUFF)
CUFF TRNQT CYL 34X4X40X1 (TOURNIQUET CUFF) IMPLANT
DRAPE C-ARM 42X120 X-RAY (DRAPES) ×2 IMPLANT
DRAPE C-ARMOR (DRAPES) ×2 IMPLANT
DRAPE EXTREMITY T 121X128X90 (DRAPE) ×2 IMPLANT
DRAPE INCISE IOBAN 66X45 STRL (DRAPES) ×2 IMPLANT
DRAPE ORTHO SPLIT 77X108 STRL (DRAPES)
DRAPE SURG ORHT 6 SPLT 77X108 (DRAPES) IMPLANT
DRSG ADAPTIC 3X8 NADH LF (GAUZE/BANDAGES/DRESSINGS) ×2 IMPLANT
DRSG PAD ABDOMINAL 8X10 ST (GAUZE/BANDAGES/DRESSINGS) ×2 IMPLANT
DURAPREP 26ML APPLICATOR (WOUND CARE) ×2 IMPLANT
ELECT REM PT RETURN 9FT ADLT (ELECTROSURGICAL) ×2
ELECTRODE REM PT RTRN 9FT ADLT (ELECTROSURGICAL) ×1 IMPLANT
GAUZE SPONGE 4X4 12PLY STRL (GAUZE/BANDAGES/DRESSINGS) ×2 IMPLANT
GLOVE BIO SURGEON STRL SZ7.5 (GLOVE) ×2 IMPLANT
GLOVE BIO SURGEON STRL SZ8 (GLOVE) ×4 IMPLANT
GLOVE BIOGEL PI IND STRL 8 (GLOVE) ×3 IMPLANT
GLOVE BIOGEL PI INDICATOR 8 (GLOVE) ×3
GOWN STRL REUS W/TWL LRG LVL3 (GOWN DISPOSABLE) ×2 IMPLANT
GOWN STRL REUS W/TWL XL LVL3 (GOWN DISPOSABLE) ×2 IMPLANT
KIT BASIN OR (CUSTOM PROCEDURE TRAY) ×2 IMPLANT
MANIFOLD NEPTUNE II (INSTRUMENTS) ×2 IMPLANT
NS IRRIG 1000ML POUR BTL (IV SOLUTION) ×2 IMPLANT
PACK TOTAL JOINT (CUSTOM PROCEDURE TRAY) ×2 IMPLANT
PADDING CAST COTTON 6X4 STRL (CAST SUPPLIES) ×4 IMPLANT
POSITIONER SURGICAL ARM (MISCELLANEOUS) ×2 IMPLANT
STAPLER VISISTAT 35W (STAPLE) IMPLANT
STRIP CLOSURE SKIN 1/2X4 (GAUZE/BANDAGES/DRESSINGS) ×2 IMPLANT
SUT MNCRL AB 4-0 PS2 18 (SUTURE) ×2 IMPLANT
SUT VIC AB 0 CT1 36 (SUTURE) ×4 IMPLANT
SUT VIC AB 2-0 CT1 27 (SUTURE) ×4
SUT VIC AB 2-0 CT1 TAPERPNT 27 (SUTURE) ×2 IMPLANT
TOWEL OR 17X26 10 PK STRL BLUE (TOWEL DISPOSABLE) ×4 IMPLANT
UNDERPAD 30X30 INCONTINENT (UNDERPADS AND DIAPERS) ×2 IMPLANT
WATER STERILE IRR 1500ML POUR (IV SOLUTION) ×2 IMPLANT

## 2015-02-21 NOTE — Op Note (Signed)
NAMEDOLLYE, GLASSER                 ACCOUNT NO.:  [id number]  MEDICAL RECORD NO.:  [id number]  LOCATION:  84                         FACILITY:  [facility]  PHYSICIAN:  Gaynelle Arabian, M.D.    DATE OF BIRTH:  1940-11-16  DATE OF PROCEDURE:  02/21/2015 DATE OF DISCHARGE:                              OPERATIVE REPORT   PREOPERATIVE DIAGNOSIS:  Infected hardware left ankle.  POSTOPERATIVE DIAGNOSIS:  Infected hardware left ankle.  PROCEDURE:  Irrigation and debridement with hardware removal left ankle.  SURGEON:  Gaynelle Arabian, M.D.  ASSISTANT:  No assistant.  ANESTHESIA:  General.  ESTIMATED BLOOD LOSS:  Minimal.  DRAINS:  None.  COMPLICATIONS:  None.  CONDITION:  Stable to recovery.  TOURNIQUET TIME:  20 minutes at 250 mmHg.  BRIEF CLINICAL NOTE:  Ms. Mettler is a 74 year old female, had open reduction and internal fixation of a left ankle fracture in 2010.  She had no problems at all until about 2 weeks ago when she developed cellulitis in her left lower leg.  She was treated with intravenous antibiotics and apparently got better.  Earlier this week, however, cellulitis recurred.  It was noted that she had some drainage from her left lateral ankle in the area where the previous surgery was.  An MRI showed no evidence of osteomyelitis.  She presents now for irrigation, debridement, and hardware removal.  PROCEDURE IN DETAIL:  After successful administration of general anesthetic, a tourniquet placed on the left thigh.  Left lower extremity prepped and draped in usual sterile fashion.  Extremities wrapped in Esmarch, tourniquet inflated to 250 mmHg.  Previous lateral incision over the lateral malleolus was utilized.  Skin cut with a 10 blade through subcutaneous tissue.  She had some superficial wound breakdown. When the cut through the skin, I sent a small amount of serous fluid for the Gram stain culture and sensitivity.  No evidence of any abscess or purulent pocket in the  ankle.  There was some fibrinous debris in the tissues and I debrided all that to normal appearing tissue.  I dissected down to the plate and removed the 5 screws from the plate and took the plate out.  No evidence of any purulence coming from the screw holes. The screws had not loosened.  There was no evidence of any abnormality along the lateral cortex of the lateral malleolus.  Specifically, no evidence of destruction consistent with osteomyelitis.  I thoroughly irrigated the wound bed with a liter of saline.  We then debrided the skin edges and closed the subcutaneous tissue with interrupted 2-0 Vicryl.  Tourniquet released.  Total time of 20 minutes.  The skin is then closed with interrupted 3-0 nylon.  The incision was then cleaned and dried and a bulky sterile dressing applied.  She was awakened and transported to recovery in stable condition.     Gaynelle Arabian, M.D.     FA/MEDQ  D:  02/21/2015  T:  02/21/2015  Job:  [id number]

## 2015-02-21 NOTE — H&P (Signed)
Belinda Harper is an 74 y.o. female.   Chief Complaint: Left lower extremity cellulitis HPI: Belinda Harper is a 74 yo female who had sudden onset of left lower leg cellulitis a few weeks ago requiring hospitalization for intravenous antibiotics. She had improvement but this week developed increased redness and swelling again and developed drainage from an old lateral ankle incision dating back to 2010 when she had ORIF of a left ankle fracture. She did not have any problems with the ankle or the incision prior to this episode. We saw her in the office and are taking her to the operating romm today for irrigation and debridement of the wound with hardware removal from the lateral malleolus  Past Medical History  Diagnosis Date  . Unspecified vitamin D deficiency 04/06/2007  . HYPERCHOLESTEROLEMIA 05/19/2008  . 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 HEMORRHOIDS 05/19/2008  . ALLERGIC RHINITIS 01/03/2007  . ASTHMA 01/03/2007  . UTERINE PROLAPSE 04/06/2007  . OSTEOARTHRITIS 01/03/2007  . OSTEOPOROSIS 01/03/2007    hx of actonbel use for at least 5 years   . FRACTURE, ANKLE, LEFT 04/10/2009  . Blood transfusion 12 2010    hg 4 transfused 4 units  . Gastritis 2002  . DEPRESSION 01/03/2007    resolved  . Normal cardiac stress test     2014  . Family history of adverse reaction to anesthesia     pt's mother as she gets older organs shut down   . Anxiety   . GERD (gastroesophageal reflux disease)     Past Surgical History  Procedure Laterality Date  . Panendoscopy      small bowel bx  . Ankle fracture surgery  02/2009    left ankle fracture and dislocation  . Cataract extraction w/ intraocular lens implant  2000    right eye  . Orif finger / thumb fracture  1970    reattached  . Mass excision Left 09/11/2012    Procedure: 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; Rotation Flap;  Surgeon: Wynonia Sours, MD;  Location: [facility] [facility];  Service: Orthopedics;  Laterality: Left;    Family History  Problem Relation Age of Onset  . Hyperlipidemia Mother   . Heart disease Mother     CABG age 25  . Ovarian cancer Mother   . Uterine cancer Mother   . Arthritis Mother   . Osteoporosis Mother   . Heart disease Father     CABG age 60  . Arthritis Father   . Melanoma Father    Social History:  reports that she has never smoked. She has never used smokeless tobacco. She reports that she drinks alcohol. She reports that she does not use illicit drugs.  Allergies:  Allergies  Allergen Reactions  . Other Other (See Comments)    YELLOW JACKETS - blood pressure dropped, passed out  . Sulfa Antibiotics Rash  . Sulfamethoxazole Rash    Medications Prior to Admission  Medication Sig Dispense Refill  . amoxicillin-clavulanate (AUGMENTIN) 875-125 MG tablet Take 1 tablet by mouth 2 (two) times daily. (Patient taking differently: Take 1 tablet by mouth 2 (two) times daily. For 11 days 10-19 to 10-30) 10 tablet 0  . Calcium Citrate-Vitamin D (CITRACAL + D PO) Take 500 mg by mouth daily. Has 250IU of vitamin D3 and 80mg  of magnesium    . docusate sodium (COLACE) 100 MG capsule Take 100 mg by mouth every evening.     [name]  doxycycline (VIBRA-TABS) 100 MG tablet Take 1 tablet (100 mg total) by mouth 2 (two) times daily. (Patient taking differently: Take 100 mg by mouth 2 (two) times daily. For 11 day 10-19 to 10-30) 10 tablet 0  . EPINEPHrine 0.3 mg/0.3 mL IJ SOAJ injection Inject 0.3 mLs (0.3 mg total) into the muscle once. 1 Device 0  . ferrous gluconate (FERGON) 225 (27 FE) MG tablet Take 1,350 mg by mouth every evening.    . Glucos-Chondroit-Hyaluron-MSM (GLUCOSAMINE CHONDROITIN JOINT) TABS Take 1 tablet by mouth daily.     [name] KERYDIN 5 % SOLN Apply 1 application topically daily as needed (for toe nail fungus).     . Menaquinone-7 (VITAMIN K2 PO) Take 1 tablet by mouth daily.     . Multiple Vitamins-Minerals  (ONE-A-DAY WOMENS VITACRAVES PO) Take 1 tablet by mouth daily.    [name] Phenylephrine-Witch Hazel (PREPARATION H) 0.25-50 % GEL Place 1 application rectally 3 (three) times daily as needed (for irritation).    . vitamin C (ASCORBIC ACID) 500 MG tablet Take 500 mg by mouth daily.     . polyethylene glycol (MIRALAX / GLYCOLAX) packet Take 17 g by mouth daily. Hold if develops diarrhea. (Patient not taking: Reported on 02/21/2015) 14 each 0    Results for orders placed or performed during the hospital encounter of 02/20/15 (from the past 48 hour(s))  Surgical pcr screen     Status: None   Collection Time: 02/20/15  2:24 PM  Result Value Ref Range   MRSA, PCR NEGATIVE NEGATIVE   Staphylococcus aureus NEGATIVE NEGATIVE    Comment:        The Xpert SA Assay (FDA approved for NASAL specimens in patients over 73 years of age), is one component of a comprehensive surveillance program.  Test performance has been validated by [facility] for patients greater than or equal to 77 year old. It is not intended to diagnose infection nor to guide or monitor treatment.    Dg Chest 2 View  02/20/2015  CLINICAL DATA:  Preop exam for orthopedic surgery EXAM: CHEST  2 VIEW COMPARISON:  02/08/2015 FINDINGS: Minimal scarring at the left base. There is no edema, consolidation, effusion, or pneumothorax. Symmetric biapical pleural based scarring. Normal heart size and mediastinal contours. Thoracic dextroscoliosis. IMPRESSION: No active cardiopulmonary disease. Electronically Signed   By: Monte Fantasia M.D.   On: 02/20/2015 15:32    ROS  Blood pressure 121/46, pulse 71, temperature 98.2 F (36.8 C), temperature source Oral, resp. rate 16, height 5\' 8"  (1.727 m), weight 68.493 kg (151 lb), SpO2 98 %. Physical Exam Physical Examination: General appearance - alert, well appearing, and in no distress Mental status - alert, oriented to person, place, and time Chest - clear to auscultation, no wheezes, rales or  rhonchi, symmetric air entry Heart - normal rate, regular rhythm, normal S1, S2, no murmurs, rubs, clicks or gallops Abdomen - soft, nontender, nondistended, no masses or organomegaly Neurological - alert, oriented, normal speech, no focal findings or movement disorder noted Left lower extremity- Swollen lateral aspect left ankle with mild erythema. Has serous drainage from old incision lateral ankle with superficial breakdown of 2 cm of incision.   xrays show healed fracture with no evidence of oseomyelitis  Assessment/Plan LLE cellulitis with infected ankle hardware- plan irrigation and debridement with hardware removal lateral malleolus. Discussed in detail with patient who elects to proceed.  Gearlean Alf 02/21/2015, 7:24 AM

## 2015-02-21 NOTE — Anesthesia Postprocedure Evaluation (Signed)
  Anesthesia Post-op Note  Patient: Belinda Harper  Procedure(s) Performed: Procedure(s): IRRIGATION AND DEBRIDEMENT HARDWARE REMOVAL LEFT ANKLE  (Left)  Patient Location: PACU  Anesthesia Type:General  Level of Consciousness: awake and alert   Airway and Oxygen Therapy: Patient Spontanous Breathing  Post-op Pain: mild  Post-op Assessment: Post-op Vital signs reviewed, Patient's Cardiovascular Status Stable and Respiratory Function Stable              Post-op Vital Signs: stable  Last Vitals:  Filed Vitals:   02/21/15 0930  BP: 155/54  Pulse: 71  Temp: 36.3 C  Resp: 14    Complications: No apparent anesthesia complications

## 2015-02-21 NOTE — Transfer of Care (Signed)
Immediate Anesthesia Transfer of Care Note  Patient: Belinda Harper  Procedure(s) Performed: Procedure(s): IRRIGATION AND DEBRIDEMENT HARDWARE REMOVAL LEFT ANKLE  (Left)  Patient Location: PACU  Anesthesia Type:General  Level of Consciousness:  sedated, patient cooperative and responds to stimulation  Airway & Oxygen Therapy:Patient Spontanous Breathing and Patient connected to face mask oxgen  Post-op Assessment:  Report given to PACU RN and Post -op Vital signs reviewed and stable  Post vital signs:  Reviewed and stable  Last Vitals:  Filed Vitals:   02/21/15 0643  BP: 121/46  Pulse: 71  Temp: 36.8 C  Resp: 16    Complications: No apparent anesthesia complications

## 2015-02-21 NOTE — Anesthesia Procedure Notes (Signed)
Procedure Name: Intubation Date/Time: 02/21/2015 7:30 AM Performed by: Luigi Stuckey, Virgel Gess Pre-anesthesia Checklist: Patient identified, Emergency Drugs available, Suction available, Patient being monitored and Timeout performed Patient Re-evaluated:Patient Re-evaluated prior to inductionOxygen Delivery Method: Circle system utilized Preoxygenation: Pre-oxygenation with 100% oxygen Intubation Type: IV induction Ventilation: Mask ventilation without difficulty Laryngoscope Size: Mac and 4 Grade View: Grade III Tube type: Oral Tube size: 6.5 mm Number of attempts: 3 Airway Equipment and Method: Stylet and Video-laryngoscopy Placement Confirmation: ETT inserted through vocal cords under direct vision,  positive ETCO2,  CO2 detector and breath sounds checked- equal and bilateral Secured at: 21 cm Tube secured with: Tape Dental Injury: Teeth and Oropharynx as per pre-operative assessment  Difficulty Due To: Difficulty was anticipated, Difficult Airway- due to anterior larynx, Difficult Airway- due to immobile epiglottis, Difficult Airway- due to limited oral opening and Difficult Airway- due to dentition Future Recommendations: Recommend- induction with short-acting agent, and alternative techniques readily available Comments: LMA attempts did not pass epiglottis.  Anterior view G3 with MAC 4, G2 view with Glidescope and smaller 6.5 ett used.  Easy mask.

## 2015-02-21 NOTE — Anesthesia Preprocedure Evaluation (Addendum)
Anesthesia Evaluation  Patient identified by MRN, date of birth, ID band Patient awake    Reviewed: Allergy & Precautions, NPO status , Patient's Chart, lab work & pertinent test results  Airway Mallampati: II   Neck ROM: Full  Mouth opening: Limited Mouth Opening  Dental  (+) Teeth Intact, Dental Advisory Given   Pulmonary asthma ,  No recent issues   breath sounds clear to auscultation       Cardiovascular  Rhythm:Regular Rate:Normal     Neuro/Psych negative neurological ROS     GI/Hepatic Neg liver ROS, GERD  ,  Endo/Other  negative endocrine ROS  Renal/GU negative Renal ROS     Musculoskeletal   Abdominal   Peds  Hematology negative hematology ROS (+)   Anesthesia Other Findings   Reproductive/Obstetrics                            Anesthesia Physical Anesthesia Plan  ASA: II  Anesthesia Plan: General   Post-op Pain Management:    Induction: Intravenous  Airway Management Planned: LMA  Additional Equipment:   Intra-op Plan:   Post-operative Plan: Extubation in OR  Informed Consent: I have reviewed the patients History and Physical, chart, labs and discussed the procedure including the risks, benefits and alternatives for the proposed anesthesia with the patient or authorized representative who has indicated his/her understanding and acceptance.   Dental advisory given  Plan Discussed with: CRNA and Surgeon  Anesthesia Plan Comments:         Anesthesia Quick Evaluation

## 2015-02-21 NOTE — Brief Op Note (Signed)
02/21/2015  8:40 AM  PATIENT:  Belinda Harper  74 y.o. female  PRE-OPERATIVE DIAGNOSIS:  INFECTED HARDWARE LEFT ANKLE   POST-OPERATIVE DIAGNOSIS:  INFECTED HARDWARE LEFT ANKLE   PROCEDURE:  Procedure(s): IRRIGATION AND DEBRIDEMENT HARDWARE REMOVAL LEFT ANKLE  (Left)  SURGEON:  Surgeon(s) and Role:    * Gaynelle Arabian, MD - Primary  PHYSICIAN ASSISTANT:   ASSISTANTS: none   ANESTHESIA:   general  EBL:  Total I/O In: 1000 [I.V.:1000] Out: -    COUNTS:  YES  TOURNIQUET:   Total Tourniquet Time Documented: Thigh (Left) - 22 minutes Total: Thigh (Left) - 22 minutes   DICTATION: .Other Dictation: Dictation Number [id number]  PLAN OF CARE: Admit to inpatient   PATIENT DISPOSITION:  PACU - hemodynamically stable.

## 2015-02-22 MED ORDER — FERROUS GLUCONATE 324 (38 FE) MG PO TABS
324.0000 mg | ORAL_TABLET | Freq: Every evening | ORAL | Status: DC
  Administered 2015-02-22: 324 mg via ORAL
  Filled 2015-02-22 (×3): qty 1

## 2015-02-22 NOTE — Progress Notes (Signed)
   Subjective: 1 Day Post-Op Procedure(s) (LRB): IRRIGATION AND DEBRIDEMENT HARDWARE REMOVAL LEFT ANKLE  (Left) Patient reports pain as mild.   Patient seen in rounds with Dr. Wynelle Link. Patient is well, but has had some minor complaints of pain in the ankle, requiring pain medications We will start therapy today.  Plan is to go Home after hospital stay.  Objective: Vital signs in last 24 hours: Temp:  [97.4 F (36.3 C)-98.6 F (37 C)] 98.6 F (37 C) (10/30 0519) Pulse Rate:  [59-82] 63 (10/30 0519) Resp:  [14-18] 16 (10/30 0519) BP: (104-155)/(41-54) 105/46 mmHg (10/30 0519) SpO2:  [98 %-100 %] 100 % (10/30 0519)  Intake/Output from previous day: 10/29 0701 - 10/30 0700 In: 1880 [P.O.:280; I.V.:1600] Out: 260 [Urine:250; Blood:10] No results for input(s): HGB in the last 72 hours. No results for input(s): WBC, RBC, HCT, PLT in the last 72 hours. No results for input(s): NA, K, CL, CO2, BUN, CREATININE, GLUCOSE, CALCIUM in the last 72 hours. No results for input(s): LABPT, INR in the last 72 hours.  EXAM General - Patient is Alert, Appropriate and Oriented Extremity - Neurovascular intact Sensation intact distally Dressing - dressing C/D/I Motor Function - intact, moving toes well on exam.   Past Medical History  Diagnosis Date  . Unspecified vitamin D deficiency 04/06/2007  . HYPERCHOLESTEROLEMIA 05/19/2008  . 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 HEMORRHOIDS 05/19/2008  . ALLERGIC RHINITIS 01/03/2007  . ASTHMA 01/03/2007  . UTERINE PROLAPSE 04/06/2007  . OSTEOARTHRITIS 01/03/2007  . OSTEOPOROSIS 01/03/2007    hx of actonbel use for at least 5 years   . FRACTURE, ANKLE, LEFT 04/10/2009  . Blood transfusion 12 2010    hg 4 transfused 4 units  . Gastritis 2002  . DEPRESSION 01/03/2007    resolved  . Normal cardiac stress test     2014  . Family history of adverse reaction to anesthesia     pt's mother as she  gets older organs shut down   . Anxiety   . GERD (gastroesophageal reflux disease)     Assessment/Plan: 1 Day Post-Op Procedure(s) (LRB): IRRIGATION AND DEBRIDEMENT HARDWARE REMOVAL LEFT ANKLE  (Left) Principal Problem:   Left leg cellulitis Active Problems:   Cellulitis of left ankle  Estimated body mass index is 22.96 kg/(m^2) as calculated from the following:   Height as of this encounter: 5\' 8"  (1.727 m).   Weight as of this encounter: 68.493 kg (151 lb). Up with therapy Plan for discharge tomorrow  DVT Prophylaxis - Lovenox Weight-Bearing as tolerated to left leg but must be in the CAM Walker when weight bearing D/C O2 and Pulse OX and try on Room Air  Arlee Muslim, PA-C Orthopaedic Surgery 02/22/2015, 7:42 AM

## 2015-02-22 NOTE — NC FL2 (Signed)
[place] LEVEL OF CARE SCREENING TOOL     IDENTIFICATION  Patient Name: Belinda Harper Birthdate: November 30, 1940 Sex: female Admission Date (Current Location): 02/21/2015  [facility] and [place] Number: Herbalist and Address:  [facility],  [place] [address], [place]      Provider Number: [id number]  Attending Physician Name and Address:  Gaynelle Arabian, MD  Relative Name and Phone Number:       Current Level of Care: Hospital Recommended Level of Care: [place] Prior Approval Number:    Date Approved/Denied:   PASRR Number:    Discharge Plan: SNF    Current Diagnoses: Patient Active Problem List   Diagnosis Date Noted  . Cellulitis of left ankle 02/21/2015  . Left leg cellulitis   . Cellulitis 02/05/2015  . Sepsis due to cellulitis ([place]) 02/05/2015  . Constipation 02/05/2015  . UTI (urinary tract infection) 02/05/2015  . Abnormal LFTs new 10/01/2014  . Other hemorrhoids 12/19/2013  . Pelvic prolapse 12/19/2013  . Medicare annual wellness visit, initial 12/18/2013  . Vertigo, peripheral 11/21/2012  . Elevated blood pressure reading 11/21/2012  . Dyspnea 10/11/2012  . Chest pain 09/19/2012  . Decreased exercise tolerance 08/21/2012  . Nodule of finger 08/21/2012  . Hx of extrinsic asthma 08/21/2012  . Leukopenia 07/24/2012  . Anemia, normocytic normochromic 07/24/2012  . 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  . Unspecified deficiency anemia 10/04/2011  . Ankle pain 08/29/2011  . Abnormal gait 08/29/2011  . Numbness of foot 08/29/2011  . Leg length discrepancy 08/07/2011  . Scoliosis 08/07/2011  . Hx of fall 08/07/2011  . Medicare annual wellness visit, subsequent 08/07/2011  . Hematuria 02/23/2011  . Rectal prolapse 07/03/2010  . Visit for preventive health examination 06/28/2010  . MALAISE AND FATIGUE 01/27/2010  . NUMBNESS 01/27/2010  . FRACTURE, ANKLE, LEFT 04/10/2009  . HYPERCHOLESTEROLEMIA  05/19/2008  . 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 HEMORRHOIDS 05/19/2008  . UNSPECIFIED VITAMIN D DEFICIENCY 04/06/2007  . UTERINE PROLAPSE 04/06/2007  . Iron deficiency anemia 01/03/2007  . DEPRESSION 01/03/2007  . ALLERGIC RHINITIS 01/03/2007  . ASTHMA 01/03/2007  . OSTEOARTHRITIS 01/03/2007  . Osteoporosis 01/03/2007    Orientation ACTIVITIES/SOCIAL BLADDER RESPIRATION    Self, Time, Situation, Place         BEHAVIORAL SYMPTOMS/MOOD NEUROLOGICAL BOWEL NUTRITION STATUS           PHYSICIAN VISITS COMMUNICATION OF NEEDS Height & Weight Skin        151 lbs.            AMBULATORY STATUS RESPIRATION    Supervision limited        Personal Care Assistance Level of Assistance  Bathing, Dressing Bathing Assistance: Limited assistance   Dressing Assistance: Limited assistance      Functional Limitations Info                SPECIAL CARE FACTORS FREQUENCY                      Additional Factors Info                  Current Medications (02/22/2015): Current Facility-Administered Medications  Medication Dose Route Frequency Provider Last Rate Last Dose  . 0.9 %  sodium chloride infusion   Intravenous Continuous Gaynelle Arabian, MD 20 mL/hr at 02/22/15 0300 20 mL/hr at 02/22/15 0300  . acetaminophen (TYLENOL) tablet 650 mg  650 mg  Oral Q6H PRN Gaynelle Arabian, MD       Or  . acetaminophen (TYLENOL) suppository 650 mg  650 mg Rectal Q6H PRN Gaynelle Arabian, MD      . acetaminophen (TYLENOL) tablet 1,000 mg  1,000 mg Oral Q6H Gaynelle Arabian, MD   1,000 mg at 02/22/15 0854  . bisacodyl (DULCOLAX) suppository 10 mg  10 mg Rectal Daily PRN Gaynelle Arabian, MD      . ceFAZolin (ANCEF) IVPB 2 g/50 mL premix  2 g Intravenous 4 times per day Gaynelle Arabian, MD   2 g at 02/22/15 0654  . docusate sodium (COLACE) capsule 100 mg  100 mg Oral QPM Gaynelle Arabian, MD   100 mg at 02/21/15 2100  . enoxaparin (LOVENOX) injection 40 mg  40 mg Subcutaneous  Q24H Gaynelle Arabian, MD   40 mg at 02/22/15 0854  . ferrous gluconate (FERGON) tablet 1,296 mg  1,296 mg Oral QPM Gaynelle Arabian, MD   1,296 mg at 02/21/15 2135  . methocarbamol (ROBAXIN) tablet 500 mg  500 mg Oral Q6H PRN Gaynelle Arabian, MD       Or  . methocarbamol (ROBAXIN) 500 mg in dextrose 5 % 50 mL IVPB  500 mg Intravenous Q6H PRN Gaynelle Arabian, MD      . metoCLOPramide (REGLAN) tablet 5-10 mg  5-10 mg Oral Q8H PRN Gaynelle Arabian, MD       Or  . metoCLOPramide (REGLAN) injection 5-10 mg  5-10 mg Intravenous Q8H PRN Gaynelle Arabian, MD      . morphine 2 MG/ML injection 1 mg  1 mg Intravenous Q2H PRN Gaynelle Arabian, MD   1 mg at 02/21/15 1144  . ondansetron (ZOFRAN) tablet 4 mg  4 mg Oral Q6H PRN Gaynelle Arabian, MD       Or  . ondansetron [facility]) injection 4 mg  4 mg Intravenous Q6H PRN Gaynelle Arabian, MD   4 mg at 02/21/15 1144  . oxyCODONE (Oxy IR/ROXICODONE) immediate release tablet 5-10 mg  5-10 mg Oral Q3H PRN Gaynelle Arabian, MD      . polyethylene glycol (MIRALAX / GLYCOLAX) packet 17 g  17 g Oral Daily PRN Gaynelle Arabian, MD      . promethazine (PHENERGAN) injection 6.25-12.5 mg  6.25-12.5 mg Intravenous Q6H PRN Arlee Muslim, PA-C   6.25 mg at 02/21/15 1627  . promethazine (PHENERGAN) tablet 12.5-25 mg  12.5-25 mg Oral Q6H PRN Arlee Muslim, PA-C      . sodium phosphate (FLEET) 7-19 GM/118ML enema 1 enema  1 enema Rectal Once PRN Gaynelle Arabian, MD      . traMADol Veatrice Bourbon) tablet 50-100 mg  50-100 mg Oral Q6H PRN Gaynelle Arabian, MD       Do not use this list as official medication orders. Please verify with discharge summary.  Discharge Medications:   Medication List    ASK your doctor about these medications        amoxicillin-clavulanate 875-125 MG tablet  Commonly known as:  AUGMENTIN  Take 1 tablet by mouth 2 (two) times daily.     CITRACAL + D PO  Take 500 mg by mouth daily. Has 250IU of vitamin D3 and 80mg  of magnesium     docusate sodium 100 MG capsule  Commonly known as:   COLACE  Take 100 mg by mouth every evening.     doxycycline 100 MG tablet  Commonly known as:  VIBRA-TABS  Take 1 tablet (100 mg total) by mouth 2 (two) times daily.  EPINEPHrine 0.3 mg/0.3 mL Soaj injection  Commonly known as:  EPI-PEN  Inject 0.3 mLs (0.3 mg total) into the muscle once.     ferrous gluconate 225 (27 FE) MG tablet  Commonly known as:  FERGON  Take 1,350 mg by mouth every evening.     GLUCOSAMINE CHONDROITIN JOINT Tabs  Take 1 tablet by mouth daily.     KERYDIN 5 % Soln  Generic drug:  Tavaborole  Apply 1 application topically daily as needed (for toe nail fungus).     ONE-A-DAY WOMENS VITACRAVES PO  Take 1 tablet by mouth daily.     polyethylene glycol packet  Commonly known as:  MIRALAX / GLYCOLAX  Take 17 g by mouth daily. Hold if develops diarrhea.     PREPARATION H 0.25-50 % Gel  Generic drug:  Phenylephrine-[place]  Place 1 application rectally 3 (three) times daily as needed (for irritation).     vitamin C 500 MG tablet  Commonly known as:  ASCORBIC ACID  Take 500 mg by mouth daily.     VITAMIN K2 PO  Take 1 tablet by mouth daily.        Relevant Imaging Results:  Relevant Lab Results:  Recent Labs    Additional Information Allergies: Other, Sulfa Antibiotics, Sulfamethoxazole  FULL CODE  Carlean Jews, LCSW

## 2015-02-22 NOTE — Evaluation (Signed)
Physical Therapy Evaluation Patient Details Name: Belinda Harper MRN: [id number] DOB: 11-Jan-1941 Today's Date: 02/22/2015   History of Present Illness  DEMITRIA Harper is a 74 y.o. female with Past medical history of iron deficiency anemia, L ankle fx 2010, chronic constipation admitted for  L ankle infection and hardeware removal on 02/21/15   Clinical Impression  Patient sitting up in bed, c/o dizziness. BP 80/54 dynamap L arm, 94/44 manual. Gradually improved with dizziness. Assisted to [facility] with not further dizziness. BP manual 112/44. N aware. Pt admitted with above diagnosis. Pt currently with functional limitations due to the deficits listed below (see PT Problem List).  Pt will benefit from skilled PT to increase their independence and safety with mobility to allow discharge to the venue listed below.  Patient plans for DC to home with friends and neighbors assisting.     Follow Up Recommendations Home health PT;Supervision/Assistance - 24 hour    Equipment Recommendations  None recommended by PT    Recommendations for Other Services       Precautions / Restrictions Precautions Precautions: Fall Required Braces or Orthoses: Other Brace/Splint Other Brace/Splint: Cam boot  for WBAT Restrictions Weight Bearing Restrictions: No      Mobility  Bed Mobility Overal bed mobility: Needs Assistance Bed Mobility: Supine to Sit;Sit to Supine     Supine to sit: HOB elevated;Min assist Sit to supine: Min assist   General bed mobility comments: support the L leg  Transfers Overall transfer level: Needs assistance Equipment used: Rolling walker (2 wheeled) Transfers: Sit to/from [facility] Sit to Stand: Min assist Stand pivot transfers: Min assist       General transfer comment: cues  to use the RW, patient preferred to just pivot.   Ambulation/Gait                Stairs            Wheelchair Mobility    Modified Rankin (Stroke Patients  Only)       Balance                                             Pertinent Vitals/Pain Pain Assessment: 0-10 Pain Score: 4  Pain Location: L leg Pain Descriptors / Indicators: Throbbing Pain Intervention(s): Limited activity within patient's tolerance;Monitored during session;Premedicated before session;Ice applied;Repositioned    Home Living Family/patient expects to be discharged to:: Private residence Living Arrangements: Alone Available Help at Discharge: Available PRN/intermittently;Friend(s);Neighbor Type of Home: House Home Access: Stairs to enter   Technical brewer of Steps: 3 Home Layout: One level Home Equipment: Environmental consultant - 2 wheels      Prior Function Level of Independence: Independent         Comments: pt very active, works in yard     Journalist, newspaper        Extremity/Trunk Assessment   Upper Extremity Assessment: Overall WFL for tasks assessed           Lower Extremity Assessment: LLE deficits/detail   LLE Deficits / Details: leg wraped, placed CAM boot for transfer     Communication   Communication: No difficulties  Cognition Arousal/Alertness: Awake/alert Behavior During Therapy: WFL for tasks assessed/performed Overall Cognitive Status: Within Functional Limits for tasks assessed  General Comments      Exercises        Assessment/Plan    PT Assessment Patient needs continued PT services  PT Diagnosis Difficulty walking;Acute pain;Generalized weakness   PT Problem List Decreased activity tolerance;Decreased strength;Decreased mobility;Decreased knowledge of precautions;Decreased safety awareness;Decreased knowledge of use of DME  PT Treatment Interventions DME instruction;Gait training;Stair training;Functional mobility training;Therapeutic activities;Therapeutic exercise;Patient/family education   PT Goals (Current goals can be found in the Care Plan section) Acute Rehab PT  Goals Patient Stated Goal: to  do water arobics. go out to eat. PT Goal Formulation: With patient Time For Goal Achievement: 02/26/15 Potential to Achieve Goals: Good    Frequency 7X/week   Barriers to discharge Decreased caregiver support      Co-evaluation               End of Session   Activity Tolerance: Patient limited by fatigue Patient left: in bed;with call bell/phone within reach;with family/visitor present Nurse Communication: Mobility status         Time: 0915-0956 PT Time Calculation (min) (ACUTE ONLY): 41 min   Charges:   PT Evaluation $Initial PT Evaluation Tier I: 1 Procedure PT Treatments $Therapeutic Activity: 23-37 mins   PT G CodesElloise, [place] 02/22/2015, 10:13 AM Tresa Endo PT [phone]

## 2015-02-23 ENCOUNTER — Encounter (HOSPITAL_COMMUNITY): Payer: Self-pay | Admitting: Orthopedic Surgery

## 2015-02-23 LAB — CULTURE, BLOOD (ROUTINE X 2)
Culture: NO GROWTH
Culture: NO GROWTH

## 2015-02-23 MED ORDER — DOXYCYCLINE HYCLATE 100 MG PO TABS: 100.0000 mg | ORAL_TABLET | Freq: Two times a day (BID) | ORAL | Status: AC

## 2015-02-23 MED ORDER — TRAMADOL HCL 50 MG PO TABS: 50.0000 mg | ORAL_TABLET | Freq: Four times a day (QID) | ORAL | Status: DC | PRN

## 2015-02-23 MED ORDER — AMOXICILLIN-POT CLAVULANATE 875-125 MG PO TABS: 1.0000 | ORAL_TABLET | Freq: Two times a day (BID) | ORAL | Status: AC

## 2015-02-23 MED ORDER — METHOCARBAMOL 500 MG PO TABS: 500.0000 mg | ORAL_TABLET | Freq: Four times a day (QID) | ORAL | Status: DC | PRN

## 2015-02-23 MED ORDER — OXYCODONE HCL 5 MG PO TABS: 5.0000 mg | ORAL_TABLET | ORAL | Status: DC | PRN

## 2015-02-23 NOTE — Progress Notes (Signed)
Physical Therapy Treatment Patient Details Name: Belinda Harper MRN: [id number] DOB: 10-18-1940 Today's Date: 02/23/2015    History of Present Illness Belinda Harper is a 74 y.o. female with Past medical history of iron deficiency anemia, L ankle fx 2010, chronic constipation admitted for  L ankle infection and hardeware removal on 10/29/16with LLE cellulitis with sepsis, volume overload, UTI.      PT Comments    POD # 2 assisted with applying CAM boot.  Assisted OOB to [facility] then amb in hallway.  Also practiced going up 2 steps backward with walker.  Returned to room with L LE elevated.  Pt ready for D/C to home.  Follow Up Recommendations  Home health PT;Supervision/Assistance - 24 hour     Equipment Recommendations  None recommended by PT    Recommendations for Other Services       Precautions / Restrictions Precautions Precautions: Fall Required Braces or Orthoses: Other Brace/Splint Other Brace/Splint: Cam boot  for WBAT Restrictions Weight Bearing Restrictions: No    Mobility  Bed Mobility Overal bed mobility: Needs Assistance Bed Mobility: Supine to Sit;Sit to Supine     Supine to sit: Supervision;Min guard Sit to supine: Supervision;Min guard   General bed mobility comments: increased time  Transfers Overall transfer level: Needs assistance Equipment used: Rolling walker (2 wheeled) Transfers: Sit to/from Stand Sit to Stand: Supervision;Min guard         General transfer comment: 25% VC's on safety with turns  Ambulation/Gait Ambulation/Gait assistance: Supervision Ambulation Distance (Feet): 125 Feet Assistive device: Rolling walker (2 wheeled) Gait Pattern/deviations: Step-to pattern;Decreased stance time - left Gait velocity: decreased   General Gait Details: 25% VC's on safety with turns.  Tolerated increased distance.    Stairs Stairs: Yes Stairs assistance: Min assist Stair Management: No rails;Step to pattern;Backwards;With walker Number  of Stairs: 2 General stair comments: 50% VC's on proper tech and sequencing.  Wheelchair Mobility    Modified Rankin (Stroke Patients Only)       Balance                                    Cognition Arousal/Alertness: Awake/alert Behavior During Therapy: WFL for tasks assessed/performed Overall Cognitive Status: Within Functional Limits for tasks assessed                      Exercises      General Comments        Pertinent Vitals/Pain Pain Assessment: 0-10 Pain Score: 4  Pain Location: L LE Pain Descriptors / Indicators: Throbbing Pain Intervention(s): Monitored during session;Premedicated before session;Repositioned    Home Living                      Prior Function            PT Goals (current goals can now be found in the care plan section) Progress towards PT goals: Progressing toward goals    Frequency  7X/week    PT Plan Current plan remains appropriate    Co-evaluation             End of Session Equipment Utilized During Treatment: Gait belt Activity Tolerance: Patient tolerated treatment well Patient left: in chair;with call bell/phone within reach     Time: 1110-1140 PT Time Calculation (min) (ACUTE ONLY): 30 min  Charges:  $Gait Training: 8-22 mins $Therapeutic Activity: 8-22 mins  G Codes:      Belinda Harper  PTA [facility]  Acute  Rehab Pager      [phone]

## 2015-02-23 NOTE — Progress Notes (Signed)
   Subjective: 2 Days Post-Op Procedure(s) (LRB): IRRIGATION AND DEBRIDEMENT HARDWARE REMOVAL LEFT ANKLE  (Left) Patient reports pain as mild.   Patient seen in rounds by Dr. Wynelle Link. Patient is well, and has had no acute complaints or problems Patient is ready to go home  Objective: Vital signs in last 24 hours: Temp:  [98.3 F (36.8 C)-98.7 F (37.1 C)] 98.7 F (37.1 C) (10/31 0613) Pulse Rate:  [65-77] 72 (10/31 0613) Resp:  [15-16] 16 (10/31 0613) BP: (109-145)/(47-68) 145/68 mmHg (10/31 0613) SpO2:  [88 %-97 %] 92 % (10/31 1610)  Intake/Output from previous day:  Intake/Output Summary (Last 24 hours) at 02/23/15 0709 Last data filed at 02/23/15 0616  Gross per 24 hour  Intake   1020 ml  Output      0 ml  Net   1020 ml   Labs: No results for input(s): HGB in the last 72 hours. No results for input(s): WBC, RBC, HCT, PLT in the last 72 hours. No results for input(s): NA, K, CL, CO2, BUN, CREATININE, GLUCOSE, CALCIUM in the last 72 hours. No results for input(s): LABPT, INR in the last 72 hours.  EXAM: General - Patient is Alert and Appropriate Extremity - Neurovascular intact Sensation intact distally Incision - clean, dry Motor Function - intact, moving foot and toes well on exam.   Assessment/Plan: 2 Days Post-Op Procedure(s) (LRB): IRRIGATION AND DEBRIDEMENT HARDWARE REMOVAL LEFT ANKLE  (Left) Procedure(s) (LRB): IRRIGATION AND DEBRIDEMENT HARDWARE REMOVAL LEFT ANKLE  (Left) Past Medical History  Diagnosis Date  . Unspecified vitamin D deficiency 04/06/2007  . HYPERCHOLESTEROLEMIA 05/19/2008  . 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 HEMORRHOIDS 05/19/2008  . ALLERGIC RHINITIS 01/03/2007  . ASTHMA 01/03/2007  . UTERINE PROLAPSE 04/06/2007  . OSTEOARTHRITIS 01/03/2007  . OSTEOPOROSIS 01/03/2007    hx of actonbel use for at least 5 years   . FRACTURE, ANKLE, LEFT 04/10/2009  . Blood transfusion 12  2010    hg 4 transfused 4 units  . Gastritis 2002  . DEPRESSION 01/03/2007    resolved  . Normal cardiac stress test     2014  . Family history of adverse reaction to anesthesia     pt's mother as she gets older organs shut down   . Anxiety   . GERD (gastroesophageal reflux disease)    Principal Problem:   Left leg cellulitis Active Problems:   Cellulitis of left ankle  Estimated body mass index is 22.96 kg/(m^2) as calculated from the following:   Height as of this encounter: 5\' 8"  (1.727 m).   Weight as of this encounter: 68.493 kg (151 lb). Up with therapy Discharge home with home health Diet - Cardiac diet Follow up - next Friday the 4th Activity - WBAT while lin CAM boot Disposition - Home Condition Upon Discharge - Stable D/C Meds - See DC Summary DVT Prophylaxis - None due to history of Gastrointestinal bleeding  Arlee Muslim, PA-C Orthopaedic Surgery 02/23/2015, 7:09 AM

## 2015-02-23 NOTE — Care Management Note (Signed)
Case Management Note  Patient Details  Name: Belinda Harper MRN: [id number] Date of Birth: [birth year]  Subjective/Objective:      S/p Irrigation and debridement with hardware removal left ankle              Action/Plan: Discharge planning, spoke with patient at bedside. Patient had surgery 6 years ago and had HH but not sure who with. Patient had no preference of agency, did not want to see a list. Contacted [facility] for referral. Patient has walker at home, does not feel she needs any further DME. Plans to have friends come to help at d/c, they will assist with meals and transportation.   Expected Discharge Date:                  Expected Discharge Plan:  [place]  In-House Referral:  NA  Discharge planning Services  CM Consult  Post Acute Care Choice:  Home Health Choice offered to:  Patient  DME Arranged:  N/A DME Agency:  NA  HH Arranged:  PT HH Agency:     Status of Service:  Completed, signed off  Medicare Important Message Given:    Date Medicare IM Given:    Medicare IM give by:    Date Additional Medicare IM Given:    Additional Medicare Important Message give by:     If discussed at [place] of Stay Meetings, dates discussed:    Additional Comments:  Guadalupe Maple, RN 02/23/2015, 10:51 AM

## 2015-02-23 NOTE — Discharge Summary (Signed)
Physician Discharge Summary   Patient ID: Belinda Harper MRN: [id number] DOB/AGE: [birth year] 74 y.o.  Admit date: 02/21/2015 Discharge date: 02/23/2015  Primary Diagnosis:  Infected hardware left ankle. Admission Diagnoses:  Past Medical History  Diagnosis Date  . Unspecified vitamin D deficiency 04/06/2007  . HYPERCHOLESTEROLEMIA 05/19/2008  . 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 HEMORRHOIDS 05/19/2008  . ALLERGIC RHINITIS 01/03/2007  . ASTHMA 01/03/2007  . UTERINE PROLAPSE 04/06/2007  . OSTEOARTHRITIS 01/03/2007  . OSTEOPOROSIS 01/03/2007    hx of actonbel use for at least 5 years   . FRACTURE, ANKLE, LEFT 04/10/2009  . Blood transfusion 12 2010    hg 4 transfused 4 units  . Gastritis 2002  . DEPRESSION 01/03/2007    resolved  . Normal cardiac stress test     2014  . Family history of adverse reaction to anesthesia     pt's mother as she gets older organs shut down   . Anxiety   . GERD (gastroesophageal reflux disease)    Discharge Diagnoses:   Principal Problem:   Left leg cellulitis Active Problems:   Cellulitis of left ankle  Estimated body mass index is 22.96 kg/(m^2) as calculated from the following:   Height as of this encounter: 5' 8"  (1.727 m).   Weight as of this encounter: 68.493 kg (151 lb).  Procedure:  Procedure(s) (LRB): IRRIGATION AND DEBRIDEMENT HARDWARE REMOVAL LEFT ANKLE  (Left)   Consults: None  HPI: Belinda Harper is a 74 year old female, had open reduction and internal fixation of a left ankle fracture in 2010. She had no problems at all until about 2 weeks ago when she developed cellulitis in her left lower leg. She was treated with intravenous antibiotics and apparently got better. Earlier this week, however, cellulitis recurred. It was noted that she had some drainage from her left lateral ankle in the area where the previous surgery was. An MRI showed no evidence of  osteomyelitis. She presents now for irrigation, debridement, and hardware removal.  Laboratory Data: Admission on 02/21/2015, Discharged on 02/23/2015  Component Date Value Ref Range Status  . Specimen Description 02/21/2015 ABSCESS LEFT ANKLE INFECTED HARDWARE   Final  . Special Requests 02/21/2015 PATIENT ON FOLLOWING ANCEF   Final  . Gram Stain 02/21/2015    Final                   Value:NO WBC SEEN NO SQUAMOUS EPITHELIAL CELLS SEEN NO ORGANISMS SEEN Performed at [facility]   . Culture 02/21/2015    Final                   Value:NO ANAEROBES ISOLATED; CULTURE IN PROGRESS FOR 5 DAYS Performed at [facility]   . Report Status 02/21/2015 PENDING   Incomplete  . Specimen Description 02/21/2015 ABSCESS LEFT ANKLE INFECTED HARDWARE   Final  . Special Requests 02/21/2015 PATIENT ON FOLLOWING ANCEF   Final  . Gram Stain 02/21/2015    Final                   Value:NO WBC SEEN NO SQUAMOUS EPITHELIAL CELLS SEEN MODERATE GRAM NEGATIVE RODS Performed at [facility]   . Culture 02/21/2015    Final                   Value:MODERATE PSEUDOMONAS AERUGINOSA Performed at [facility]   . Report Status 02/21/2015 02/24/2015 FINAL  Final  . Organism ID, Bacteria 02/21/2015 PSEUDOMONAS AERUGINOSA   Final  Hospital Outpatient Visit on 02/20/2015  Component Date Value Ref Range Status  . MRSA, PCR 02/20/2015 NEGATIVE  NEGATIVE Final  . Staphylococcus aureus 02/20/2015 NEGATIVE  NEGATIVE Final   Comment:        The Xpert SA Assay (FDA approved for NASAL specimens in patients over 59 years of age), is one component of a comprehensive surveillance program.  Test performance has been validated by [facility] for patients greater than or equal to 49 year old. It is not intended to diagnose infection nor to guide or monitor treatment.   Admission on 02/18/2015, Discharged on 02/19/2015  Component Date Value Ref Range Status  . Sodium 02/18/2015 137  135 -  145 mmol/L Final  . Potassium 02/18/2015 4.0  3.5 - 5.1 mmol/L Final  . Chloride 02/18/2015 99* 101 - 111 mmol/L Final  . CO2 02/18/2015 31  22 - 32 mmol/L Final  . Glucose, Bld 02/18/2015 100* 65 - 99 mg/dL Final  . BUN 02/18/2015 29* 6 - 20 mg/dL Final  . Creatinine, Ser 02/18/2015 0.70  0.44 - 1.00 mg/dL Final  . Calcium 02/18/2015 9.4  8.9 - 10.3 mg/dL Final  . Total Protein 02/18/2015 8.1  6.5 - 8.1 g/dL Final  . Albumin 02/18/2015 3.5  3.5 - 5.0 g/dL Final  . AST 02/18/2015 22  15 - 41 U/L Final  . ALT 02/18/2015 12* 14 - 54 U/L Final  . Alkaline Phosphatase 02/18/2015 76  38 - 126 U/L Final  . Total Bilirubin 02/18/2015 0.3  0.3 - 1.2 mg/dL Final  . GFR calc non Af Amer 02/18/2015 >60  >60 mL/min Final  . GFR calc Af Amer 02/18/2015 >60  >60 mL/min Final   Comment: (NOTE) The eGFR has been calculated using the CKD EPI equation. This calculation has not been validated in all clinical situations. eGFR's persistently <60 mL/min signify possible Chronic Kidney Disease.   . Anion gap 02/18/2015 7  5 - 15 Final  . WBC 02/18/2015 4.3  4.0 - 10.5 K/uL Final  . RBC 02/18/2015 3.40* 3.87 - 5.11 MIL/uL Final  . Hemoglobin 02/18/2015 9.3* 12.0 - 15.0 g/dL Final  . HCT 02/18/2015 30.4* 36.0 - 46.0 % Final  . MCV 02/18/2015 89.4  78.0 - 100.0 fL Final  . MCH 02/18/2015 27.4  26.0 - 34.0 pg Final  . MCHC 02/18/2015 30.6  30.0 - 36.0 g/dL Final  . RDW 02/18/2015 15.3  11.5 - 15.5 % Final  . Platelets 02/18/2015 469* 150 - 400 K/uL Final  . Neutrophils Relative % 02/18/2015 51   Final  . Neutro Abs 02/18/2015 2.3  1.7 - 7.7 K/uL Final  . Lymphocytes Relative 02/18/2015 36   Final  . Lymphs Abs 02/18/2015 1.5  0.7 - 4.0 K/uL Final  . Monocytes Relative 02/18/2015 9   Final  . Monocytes Absolute 02/18/2015 0.4  0.1 - 1.0 K/uL Final  . Eosinophils Relative 02/18/2015 3   Final  . Eosinophils Absolute 02/18/2015 0.1  0.0 - 0.7 K/uL Final  . Basophils Relative 02/18/2015 1   Final  .  Basophils Absolute 02/18/2015 0.0  0.0 - 0.1 K/uL Final  . Lactic Acid, Venous 02/18/2015 0.60  0.5 - 2.0 mmol/L Final  . Sed Rate 02/18/2015 100* 0 - 22 mm/hr Final  . Specimen Description 02/18/2015 BLOOD LEFT ANTECUBITAL   Final  . Special Requests 02/18/2015 BOTTLES DRAWN AEROBIC AND ANAEROBIC 5ML  Final  . Culture 02/18/2015    Final                   Value:NO GROWTH 5 DAYS Performed at [facility]   . Report Status 02/18/2015 02/23/2015 FINAL   Final  . Specimen Description 02/18/2015 BLOOD BLOOD RIGHT FOREARM   Final  . Special Requests 02/18/2015 BOTTLES DRAWN AEROBIC AND ANAEROBIC 5ML   Final  . Culture 02/18/2015    Final                   Value:NO GROWTH 5 DAYS Performed at [facility]   . Report Status 02/18/2015 02/23/2015 FINAL   Final  . Lactic Acid, Venous 02/18/2015 0.59  0.5 - 2.0 mmol/L Final  Office Visit on 02/17/2015  Component Date Value Ref Range Status  . Sodium 02/17/2015 139  135 - 145 mEq/L Final  . Potassium 02/17/2015 5.1  3.5 - 5.1 mEq/L Final  . Chloride 02/17/2015 99  96 - 112 mEq/L Final  . CO2 02/17/2015 35* 19 - 32 mEq/L Final  . Glucose, Bld 02/17/2015 101* 70 - 99 mg/dL Final  . BUN 02/17/2015 31* 6 - 23 mg/dL Final  . Creatinine, Ser 02/17/2015 0.83  0.40 - 1.20 mg/dL Final  . Calcium 02/17/2015 9.7  8.4 - 10.5 mg/dL Final  . GFR 02/17/2015 71.61  >60.00 mL/min Final  . WBC 02/17/2015 5.8  4.0 - 10.5 K/uL Final  . RBC 02/17/2015 3.61* 3.87 - 5.11 Mil/uL Final  . Hemoglobin 02/17/2015 10.0* 12.0 - 15.0 g/dL Final  . HCT 02/17/2015 31.3* 36.0 - 46.0 % Final  . MCV 02/17/2015 86.6  78.0 - 100.0 fl Final  . MCHC 02/17/2015 31.8  30.0 - 36.0 g/dL Final  . RDW 02/17/2015 15.9* 11.5 - 15.5 % Final  . Platelets 02/17/2015 460.0* 150.0 - 400.0 K/uL Final  . Neutrophils Relative % 02/17/2015 63.0  43.0 - 77.0 % Final  . Lymphocytes Relative 02/17/2015 26.7  12.0 - 46.0 % Final  . Monocytes Relative 02/17/2015 8.2  3.0 - 12.0 % Final   . Eosinophils Relative 02/17/2015 1.6  0.0 - 5.0 % Final  . Basophils Relative 02/17/2015 0.5  0.0 - 3.0 % Final  . Neutro Abs 02/17/2015 3.7  1.4 - 7.7 K/uL Final  . Lymphs Abs 02/17/2015 1.6  0.7 - 4.0 K/uL Final  . Monocytes Absolute 02/17/2015 0.5  0.1 - 1.0 K/uL Final  . Eosinophils Absolute 02/17/2015 0.1  0.0 - 0.7 K/uL Final  . Basophils Absolute 02/17/2015 0.0  0.0 - 0.1 K/uL Final  . CRP 02/17/2015 3.2  0.5 - 20.0 mg/dL Final  . Sed Rate 02/17/2015 109* 0 - 22 mm/hr Final  Admission on 02/05/2015, Discharged on 02/12/2015  Component Date Value Ref Range Status  . Lactic Acid, Venous 02/05/2015 1.19  0.5 - 2.0 mmol/L Final  . Sodium 02/05/2015 135  135 - 145 mmol/L Final  . Potassium 02/05/2015 3.5  3.5 - 5.1 mmol/L Final  . Chloride 02/05/2015 100* 101 - 111 mmol/L Final  . CO2 02/05/2015 26  22 - 32 mmol/L Final  . Glucose, Bld 02/05/2015 124* 65 - 99 mg/dL Final  . BUN 02/05/2015 23* 6 - 20 mg/dL Final  . Creatinine, Ser 02/05/2015 0.86  0.44 - 1.00 mg/dL Final  . Calcium 02/05/2015 9.1  8.9 - 10.3 mg/dL Final  . Total Protein 02/05/2015 7.5  6.5 - 8.1 g/dL Final  . Albumin 02/05/2015 3.9  3.5 -  5.0 g/dL Final  . AST 02/05/2015 32  15 - 41 U/L Final  . ALT 02/05/2015 18  14 - 54 U/L Final  . Alkaline Phosphatase 02/05/2015 63  38 - 126 U/L Final  . Total Bilirubin 02/05/2015 0.8  0.3 - 1.2 mg/dL Final  . GFR calc non Af Amer 02/05/2015 >60  >60 mL/min Final  . GFR calc Af Amer 02/05/2015 >60  >60 mL/min Final   Comment: (NOTE) The eGFR has been calculated using the CKD EPI equation. This calculation has not been validated in all clinical situations. eGFR's persistently <60 mL/min signify possible Chronic Kidney Disease.   . Anion gap 02/05/2015 9  5 - 15 Final  . WBC 02/05/2015 13.4* 4.0 - 10.5 K/uL Final  . RBC 02/05/2015 4.15  3.87 - 5.11 MIL/uL Final  . Hemoglobin 02/05/2015 11.6* 12.0 - 15.0 g/dL Final  . HCT 02/05/2015 36.4  36.0 - 46.0 % Final  . MCV  02/05/2015 87.7  78.0 - 100.0 fL Final  . MCH 02/05/2015 28.0  26.0 - 34.0 pg Final  . MCHC 02/05/2015 31.9  30.0 - 36.0 g/dL Final  . RDW 02/05/2015 16.2* 11.5 - 15.5 % Final  . Platelets 02/05/2015 156  150 - 400 K/uL Final  . Neutrophils Relative % 02/05/2015 93   Final  . Neutro Abs 02/05/2015 12.4* 1.7 - 7.7 K/uL Final  . Lymphocytes Relative 02/05/2015 4   Final  . Lymphs Abs 02/05/2015 0.6* 0.7 - 4.0 K/uL Final  . Monocytes Relative 02/05/2015 3   Final  . Monocytes Absolute 02/05/2015 0.3  0.1 - 1.0 K/uL Final  . Eosinophils Relative 02/05/2015 0   Final  . Eosinophils Absolute 02/05/2015 0.0  0.0 - 0.7 K/uL Final  . Basophils Relative 02/05/2015 0   Final  . Basophils Absolute 02/05/2015 0.0  0.0 - 0.1 K/uL Final  . Specimen Description 02/05/2015 BLOOD LAC   Final  . Special Requests 02/05/2015 BOTTLES DRAWN AEROBIC AND ANAEROBIC 7 CC   Final  . Culture 02/05/2015    Final                   Value:NO GROWTH 5 DAYS Performed at [facility]   . Report Status 02/05/2015 02/10/2015 FINAL   Final  . Specimen Description 02/05/2015 BLOOD RIGHT ANTECUBITAL   Final  . Special Requests 02/05/2015 BOTTLES DRAWN AEROBIC AND ANAEROBIC 5 CC   Final  . Culture 02/05/2015    Final                   Value:NO GROWTH 5 DAYS Performed at [facility]   . Report Status 02/05/2015 02/10/2015 FINAL   Final  . Color, Urine 02/05/2015 YELLOW  YELLOW Final  . APPearance 02/05/2015 TURBID* CLEAR Final  . Specific Gravity, Urine 02/05/2015 1.017  1.005 - 1.030 Final  . pH 02/05/2015 8.5* 5.0 - 8.0 Final  . Glucose, UA 02/05/2015 NEGATIVE  NEGATIVE mg/dL Final  . Hgb urine dipstick 02/05/2015 SMALL* NEGATIVE Final  . Bilirubin Urine 02/05/2015 NEGATIVE  NEGATIVE Final  . Ketones, ur 02/05/2015 NEGATIVE  NEGATIVE mg/dL Final  . Protein, ur 02/05/2015 100* NEGATIVE mg/dL Final  . Urobilinogen, UA 02/05/2015 1.0  0.0 - 1.0 mg/dL Final  . Nitrite 02/05/2015 POSITIVE* NEGATIVE Final  .  Leukocytes, UA 02/05/2015 LARGE* NEGATIVE Final  . Specimen Description 02/05/2015 URINE, CLEAN CATCH   Final  . Special Requests 02/05/2015 NONE   Final  . Culture 02/05/2015  Final                   Value:>=100,000 COLONIES/mL ESCHERICHIA COLI Performed at [facility]   . Report Status 02/05/2015 02/07/2015 FINAL   Final  . Organism ID, Bacteria 02/05/2015 ESCHERICHIA COLI   Final  . Squamous Epithelial / LPF 02/05/2015 RARE  RARE Final  . WBC, UA 02/05/2015 TOO NUMEROUS TO COUNT  <3 WBC/hpf Final  . RBC / HPF 02/05/2015 0-2  <3 RBC/hpf Final  . Bacteria, UA 02/05/2015 MANY* RARE Final  . Edwina Barth 02/05/2015 LESS THAN 10 mL OF URINE SUBMITTED   Final   MICROSCOPIC EXAM PERFORMED ON UNCONCENTRATED URINE  . HIV Screen 4th Generation wRfx 02/05/2015 Non Reactive  Non Reactive Final   Comment: (NOTE) Performed At: [facility] [place], [place] [id number] Lindon Romp MD SA:[id number]   . Sed Rate 02/05/2015 34* 0 - 22 mm/hr Final  . CRP 02/05/2015 10.7* <1.0 mg/dL Final   Performed at [facility]  . Total CK 02/05/2015 96  38 - 234 U/L Final  . Lactic Acid, Venous 02/05/2015 0.8  0.5 - 2.0 mmol/L Final  . Vancomycin Tr 02/08/2015 7* 10.0 - 20.0 ug/mL Final  . Sodium 02/08/2015 139  135 - 145 mmol/L Final  . Potassium 02/08/2015 3.2* 3.5 - 5.1 mmol/L Final  . Chloride 02/08/2015 107  101 - 111 mmol/L Final  . CO2 02/08/2015 26  22 - 32 mmol/L Final  . Glucose, Bld 02/08/2015 106* 65 - 99 mg/dL Final  . BUN 02/08/2015 9  6 - 20 mg/dL Final  . Creatinine, Ser 02/08/2015 0.65  0.44 - 1.00 mg/dL Final  . Calcium 02/08/2015 7.8* 8.9 - 10.3 mg/dL Final  . GFR calc non Af Amer 02/08/2015 >60  >60 mL/min Final  . GFR calc Af Amer 02/08/2015 >60  >60 mL/min Final   Comment: (NOTE) The eGFR has been calculated using the CKD EPI equation. This calculation has not been validated in all clinical situations. eGFR's persistently <60 mL/min signify  possible Chronic Kidney Disease.   . Anion gap 02/08/2015 6  5 - 15 Final  . WBC 02/08/2015 7.5  4.0 - 10.5 K/uL Final  . RBC 02/08/2015 3.67* 3.87 - 5.11 MIL/uL Final  . Hemoglobin 02/08/2015 10.0* 12.0 - 15.0 g/dL Final  . HCT 02/08/2015 32.9* 36.0 - 46.0 % Final  . MCV 02/08/2015 89.6  78.0 - 100.0 fL Final  . MCH 02/08/2015 27.2  26.0 - 34.0 pg Final  . MCHC 02/08/2015 30.4  30.0 - 36.0 g/dL Final  . RDW 02/08/2015 16.6* 11.5 - 15.5 % Final  . Platelets 02/08/2015 128* 150 - 400 K/uL Final  . B Natriuretic Peptide 02/08/2015 422.4* 0.0 - 100.0 pg/mL Final  . Sodium 02/09/2015 140  135 - 145 mmol/L Final  . Potassium 02/09/2015 3.5  3.5 - 5.1 mmol/L Final  . Chloride 02/09/2015 103  101 - 111 mmol/L Final  . CO2 02/09/2015 32  22 - 32 mmol/L Final  . Glucose, Bld 02/09/2015 122* 65 - 99 mg/dL Final  . BUN 02/09/2015 10  6 - 20 mg/dL Final  . Creatinine, Ser 02/09/2015 0.62  0.44 - 1.00 mg/dL Final  . Calcium 02/09/2015 8.7* 8.9 - 10.3 mg/dL Final  . GFR calc non Af Amer 02/09/2015 >60  >60 mL/min Final  . GFR calc Af Amer 02/09/2015 >60  >60 mL/min Final   Comment: (NOTE) The eGFR has been calculated using the  CKD EPI equation. This calculation has not been validated in all clinical situations. eGFR's persistently <60 mL/min signify possible Chronic Kidney Disease.   . Anion gap 02/09/2015 5  5 - 15 Final  . CRP 02/10/2015 22.0* <1.0 mg/dL Final   Performed at [facility]  . Vancomycin Tr 02/11/2015 10  10.0 - 20.0 ug/mL Final  . Sodium 02/11/2015 141  135 - 145 mmol/L Final  . Potassium 02/11/2015 3.3* 3.5 - 5.1 mmol/L Final  . Chloride 02/11/2015 102  101 - 111 mmol/L Final  . CO2 02/11/2015 32  22 - 32 mmol/L Final  . Glucose, Bld 02/11/2015 103* 65 - 99 mg/dL Final  . BUN 02/11/2015 10  6 - 20 mg/dL Final  . Creatinine, Ser 02/11/2015 0.63  0.44 - 1.00 mg/dL Final  . Calcium 02/11/2015 9.3  8.9 - 10.3 mg/dL Final  . GFR calc non Af Amer 02/11/2015 >60  >60  mL/min Final  . GFR calc Af Amer 02/11/2015 >60  >60 mL/min Final   Comment: (NOTE) The eGFR has been calculated using the CKD EPI equation. This calculation has not been validated in all clinical situations. eGFR's persistently <60 mL/min signify possible Chronic Kidney Disease.   . Anion gap 02/11/2015 7  5 - 15 Final  . Magnesium 02/11/2015 2.2  1.7 - 2.4 mg/dL Final  . WBC 02/11/2015 4.4  4.0 - 10.5 K/uL Final  . RBC 02/11/2015 3.84* 3.87 - 5.11 MIL/uL Final  . Hemoglobin 02/11/2015 10.6* 12.0 - 15.0 g/dL Final  . HCT 02/11/2015 34.1* 36.0 - 46.0 % Final  . MCV 02/11/2015 88.8  78.0 - 100.0 fL Final  . MCH 02/11/2015 27.6  26.0 - 34.0 pg Final  . MCHC 02/11/2015 31.1  30.0 - 36.0 g/dL Final  . RDW 02/11/2015 16.2* 11.5 - 15.5 % Final  . Platelets 02/11/2015 250  150 - 400 K/uL Final  . WBC 02/12/2015 4.0  4.0 - 10.5 K/uL Final  . RBC 02/12/2015 3.55* 3.87 - 5.11 MIL/uL Final  . Hemoglobin 02/12/2015 9.9* 12.0 - 15.0 g/dL Final  . HCT 02/12/2015 31.0* 36.0 - 46.0 % Final  . MCV 02/12/2015 87.3  78.0 - 100.0 fL Final  . MCH 02/12/2015 27.9  26.0 - 34.0 pg Final  . MCHC 02/12/2015 31.9  30.0 - 36.0 g/dL Final  . RDW 02/12/2015 16.0* 11.5 - 15.5 % Final  . Platelets 02/12/2015 280  150 - 400 K/uL Final  . Sodium 02/12/2015 141  135 - 145 mmol/L Final  . Potassium 02/12/2015 3.2* 3.5 - 5.1 mmol/L Final  . Chloride 02/12/2015 98* 101 - 111 mmol/L Final  . CO2 02/12/2015 33* 22 - 32 mmol/L Final  . Glucose, Bld 02/12/2015 87  65 - 99 mg/dL Final  . BUN 02/12/2015 15  6 - 20 mg/dL Final  . Creatinine, Ser 02/12/2015 0.68  0.44 - 1.00 mg/dL Final  . Calcium 02/12/2015 8.9  8.9 - 10.3 mg/dL Final  . GFR calc non Af Amer 02/12/2015 >60  >60 mL/min Final  . GFR calc Af Amer 02/12/2015 >60  >60 mL/min Final   Comment: (NOTE) The eGFR has been calculated using the CKD EPI equation. This calculation has not been validated in all clinical situations. eGFR's persistently <60 mL/min  signify possible Chronic Kidney Disease.   . Anion gap 02/12/2015 10  5 - 15 Final     X-Rays:Dg Chest 1 View  02/08/2015  CLINICAL DATA:  Dyspnea. No chest complaints. EXAM: CHEST 1  VIEW COMPARISON:  03/20/2009 FINDINGS: Convex right thoracic spine curvature. Midline trachea. Mild cardiomegaly. Probable small bilateral pleural effusions. No pneumothorax. Biapical pleural thickening. Interstitial prominence and indistinctness. Patchy right upper lobe airspace disease. Right infrahilar confluent opacity. Mild left base atelectasis. IMPRESSION: Patchy right upper an more confluent right infrahilar/lower lung opacity. Presuming the patient has infectious symptoms, this could represent pneumonia. Centrally obstructing lesion with postobstructive pneumonitis is a secondary concern in the right infrahilar region. Bilateral pleural effusions with suspicion of mild pulmonary venous congestion. Consider PA and lateral radiographs for further characterization. Followup PA and lateral chest X-ray is also recommended in 3-4 weeks following trial of antibiotic therapy to ensure resolution and exclude underlying malignancy. Electronically Signed   By: Abigail Miyamoto M.D.   On: 02/08/2015 11:02   Dg Chest 2 View  02/20/2015  CLINICAL DATA:  Preop exam for orthopedic surgery EXAM: CHEST  2 VIEW COMPARISON:  02/08/2015 FINDINGS: Minimal scarring at the left base. There is no edema, consolidation, effusion, or pneumothorax. Symmetric biapical pleural based scarring. Normal heart size and mediastinal contours. Thoracic dextroscoliosis. IMPRESSION: No active cardiopulmonary disease. Electronically Signed   By: Monte Fantasia M.D.   On: 02/20/2015 15:32   Dg Tibia/fibula Left  02/05/2015  CLINICAL DATA:  74 year old female with lower extremity redness and fever. Cellulitis. Initial encounter. EXAM: LEFT TIBIA AND FIBULA - 2 VIEW COMPARISON:  Tib-fib series 11 10/12/2008. FINDINGS: No knee joint effusion identified.  Chronic medial joint space loss and degenerative spurring at the knee. Patella appears intact. Distal femur and proximal tibia and fibula appear intact. Distal tibia and fibula ORIF hardware following the 2010 fractures. The proximal fibula head fracture is healed. Hardware appears intact without evidence of loosening. Distal tibia and fibula appear intact. Visible calcaneus intact. No subcutaneous gas. No other radiopaque foreign body identified. IMPRESSION: Posttraumatic and postoperative changes to the left tibia and fibula. No acute osseous abnormality identified. No subcutaneous gas. Electronically Signed   By: Genevie Ann M.D.   On: 02/05/2015 19:19   Mr Ankle Left W Wo Contrast  02/19/2015  CLINICAL DATA:  Cellulitis of the ankle. Orthopedic hardware in place. EXAM: MRI OF THE LEFT ANKLE WITHOUT AND WITH CONTRAST TECHNIQUE: Multiplanar, multisequence MR imaging of the ankle was performed before and after the administration of intravenous contrast. CONTRAST:  30m MULTIHANCE GADOBENATE DIMEGLUMINE 529 MG/ML IV SOLN COMPARISON:  02/05/2015 FINDINGS: Lag screw fixators of the medial malleolus and lateral plate and screw fixator of the lateral malleolus introduced metal artifact which reduces diagnostic sensitivity and specificity. TENDONS Peroneal: Unremarkable Posteromedial: Partially obscured but unremarkable where visualized. Type 2 accessory navicular. Anterior: Unremarkable Achilles: Unremarkable Plantar Fascia: Unremarkable LIGAMENTS Lateral: Inferior tibiofibular ligaments not well seen. There is some spurring along the inferior tibiofibular articulation. Anterior talofibular ligament is deficient. Medial: Deep tibiotalar component of the deltoid ligament poorly seen due to metal artifact. Grossly intact tibiospring and tibionavicular portions. The Lisfranc ligament looks intact. CARTILAGE Ankle Joint: Deformity along the distal tibial articular surface related to prior fracture. Trace edema centrally  along the distal tibia, image 19 series 6 and image 9 of series 7, particularly along the region of cortical irregularity. This is thought to be chronic. There is chronic chondral thinning in the tibiotalar joint without joint effusion or findings of septic joint. Subtalar Joints/Sinus Tarsi: Unremarkable Bones: No findings of osteomyelitis. Other: Subcutaneous edema is present overlying both the medial and lateral malleoli and tracking into the dorsum of the foot, with associated enhancement. IMPRESSION:  IMPRESSION 1. Subcutaneous edema and enhancement overlying the malleoli and tracking in the dorsum of the foot, quite potentially representing cellulitis. No compelling findings of osteomyelitis or fasciitis. The potential cellulitis was less explicitly described in the brief preliminary report issued last night, and accordingly this final report will be called to the referring team. 2. Chronic deformity of the distal tibia, with degenerative findings at the tibiotalar joint, and orthopedic hardware in place. 3. Type 2 accessory navicular. 4. Deficient anterior talofibular ligament, likely from remote injury. These results will be called to the ordering clinician or representative by the Radiologist Assistant, and communication documented in the PACS or zVision Dashboard. Electronically Signed   By: Van Clines M.D.   On: 02/19/2015 16:45    EKG: Orders placed or performed during the hospital encounter of 02/05/15  . EKG 12-Lead  . EKG 12-Lead  . EKG 12-Lead  . EKG 12-Lead  . EKG 12-Lead  . EKG 12-Lead     Hospital Course: Belinda Harper is a 74 y.o. who was admitted to [facility]. They were brought to the operating room on 02/21/2015 and underwent Procedure(s): Orick .  Patient tolerated the procedure well and was later transferred to the recovery room and then to the orthopaedic floor for postoperative care.  They were given PO and IV  analgesics for pain control following their surgery.  They were given 24 hours of postoperative antibiotics of  Anti-infectives    Start     Dose/Rate Route Frequency Ordered Stop   02/23/15 0000  amoxicillin-clavulanate (AUGMENTIN) 875-125 MG tablet     1 tablet Oral 2 times daily 02/23/15 0723 03/01/15 2359   02/23/15 0000  doxycycline (VIBRA-TABS) 100 MG tablet     100 mg Oral 2 times daily 02/23/15 0723 03/01/15 2359   02/21/15 1400  ceFAZolin (ANCEF) IVPB 2 g/50 mL premix  Status:  Discontinued     2 g 100 mL/hr over 30 Minutes Intravenous 4 times per day 02/21/15 0941 02/23/15 1554   02/21/15 0621  ceFAZolin (ANCEF) IVPB 2 g/50 mL premix     2 g 100 mL/hr over 30 Minutes Intravenous On call to O.R. 02/21/15 0160 02/21/15 0750     and started on DVT prophylaxis in the form of Lovenox.   PT and OT were ordered for total joint protocol.  Discharge planning consulted to help with postop disposition and equipment needs.  Patient had a tough night on the evening of surgery.  They started to get up OOB with therapy on day one but progressed slowly and was not ready for discharge.  Continued to work with therapy into day two.  CAM walker was in place.  Patient was seen in rounds and was ready to go home.  Discharge home with home health Diet - Cardiac diet Follow up - next Friday the 4th Activity - WBAT while lin CAM boot Disposition - Home Condition Upon Discharge - Stable D/C Meds - See DC Summary DVT Prophylaxis - None due to history of Gastrointestinal bleeding      Discharge Instructions    Call MD / Call 911    Complete by:  As directed   If you experience chest pain or shortness of breath, CALL 911 and be transported to the hospital emergency room.  If you develope a fever above 101 F, pus (white drainage) or increased drainage or redness at the wound, or calf pain, call your surgeon's office.  Change dressing    Complete by:  As directed   Change dressing daily with sterile 4  x 4 inch gauze dressing and apply TED hose. Do not submerge the incision under water.     Constipation Prevention    Complete by:  As directed   Drink plenty of fluids.  Prune juice may be helpful.  You may use a stool softener, such as Colace (over the counter) 100 mg twice a day.  Use MiraLax (over the counter) for constipation as needed.     Diet - low sodium heart healthy    Complete by:  As directed      Discharge instructions    Complete by:  As directed   Pick up stool softner and laxative for home use following surgery while on pain medications. Do not submerge incision under water. Please use good hand washing techniques while changing dressing each day. May shower starting three days after surgery. Please use a clean towel to pat the incision dry following showers. Continue to use ice for pain and swelling after surgery. Do not use any lotions or creams on the incision until instructed by your surgeon.  CAM boot at all times when up weight bearing and walking.     Do not sit on low chairs, stoools or toilet seats, as it may be difficult to get up from low surfaces    Complete by:  As directed      Driving restrictions    Complete by:  As directed   No driving until released by the physician.     Increase activity slowly as tolerated    Complete by:  As directed      Lifting restrictions    Complete by:  As directed   No lifting until released by the physician.     Patient may shower    Complete by:  As directed   You may shower without a dressing once there is no drainage.  Do not wash over the wound.  If drainage remains, do not shower until drainage stops.     Weight bearing as tolerated    Complete by:  As directed   Laterality:  left  Extremity:  Lower            Medication List    TAKE these medications        amoxicillin-clavulanate 875-125 MG tablet  Commonly known as:  AUGMENTIN  Take 1 tablet by mouth 2 (two) times daily.     CITRACAL + D PO  Take  500 mg by mouth daily. Has 250IU of vitamin D3 and 65m of magnesium     docusate sodium 100 MG capsule  Commonly known as:  COLACE  Take 100 mg by mouth every evening.     doxycycline 100 MG tablet  Commonly known as:  VIBRA-TABS  Take 1 tablet (100 mg total) by mouth 2 (two) times daily.     EPINEPHrine 0.3 mg/0.3 mL Soaj injection  Commonly known as:  EPI-PEN  Inject 0.3 mLs (0.3 mg total) into the muscle once.     ferrous gluconate 225 (27 FE) MG tablet  Commonly known as:  FERGON  Take 324 mg by mouth every evening.     GLUCOSAMINE CHONDROITIN JOINT Tabs  Take 1 tablet by mouth daily.     KERYDIN 5 % Soln  Generic drug:  Tavaborole  Apply 1 application topically daily as needed (for toe nail fungus).     methocarbamol 500 MG  tablet  Commonly known as:  ROBAXIN  Take 1 tablet (500 mg total) by mouth every 6 (six) hours as needed for muscle spasms.     ONE-A-DAY WOMENS VITACRAVES PO  Take 1 tablet by mouth daily.     oxyCODONE 5 MG immediate release tablet  Commonly known as:  Oxy IR/ROXICODONE  Take 1-2 tablets (5-10 mg total) by mouth every 3 (three) hours as needed for moderate pain or severe pain.     polyethylene glycol packet  Commonly known as:  MIRALAX / GLYCOLAX  Take 17 g by mouth daily. Hold if develops diarrhea.     PREPARATION H 0.25-50 % Gel  Generic drug:  Phenylephrine-Witch Hazel  Place 1 application rectally 3 (three) times daily as needed (for irritation).     traMADol 50 MG tablet  Commonly known as:  ULTRAM  Take 1-2 tablets (50-100 mg total) by mouth every 6 (six) hours as needed for moderate pain.     vitamin C 500 MG tablet  Commonly known as:  ASCORBIC ACID  Take 500 mg by mouth daily.     VITAMIN K2 PO  Take 1 tablet by mouth daily.       Follow-up Information    Follow up with Gearlean Alf, MD. Schedule an appointment as soon as possible for a visit on 02/27/2015.   Specialty:  Orthopedic Surgery   Why:  Follow up Friday with  Dr. Wynelle Link.  Call office at [phone] to setup appointment and time.   Contact information:   [address] [place] [address] [phone]       Follow up with [facility].   Contact information:   [address] [place] [address] [phone]       Signed: Arlee Muslim, PA-C Orthopaedic Surgery 02/26/2015, 11:21 AM

## 2015-02-23 NOTE — Discharge Instructions (Signed)
Cellulitis °Cellulitis is an infection of the skin and the tissue under the skin. The infected area is usually red and tender. This happens most often in the arms and lower legs. °HOME CARE  °· Take your antibiotic medicine as told. Finish the medicine even if you start to feel better. °· Keep the infected arm or leg raised (elevated). °· Put a warm cloth on the area up to 4 times per day. °· Only take medicines as told by your doctor. °· Keep all doctor visits as told. °GET HELP IF: °· You see red streaks on the skin coming from the infected area. °· Your red area gets bigger or turns a dark color. °· Your bone or joint under the infected area is painful after the skin heals. °· Your infection comes back in the same area or different area. °· You have a puffy (swollen) bump in the infected area. °· You have new symptoms. °· You have a fever. °GET HELP RIGHT AWAY IF:  °· You feel very sleepy. °· You throw up (vomit) or have watery poop (diarrhea). °· You feel sick and have muscle aches and pains. °  °This information is not intended to replace advice given to you by your health care provider. Make sure you discuss any questions you have with your health care provider. °  °Document Released: 09/28/2007 Document Revised: 12/31/2014 Document Reviewed: 06/27/2011 °Elsevier Interactive Patient Education ©2016 [employer] ° °

## 2015-02-24 LAB — CULTURE, ROUTINE-ABSCESS: Gram Stain: NONE SEEN

## 2015-02-27 LAB — ANAEROBIC CULTURE: Gram Stain: NONE SEEN

## 2015-03-02 ENCOUNTER — Ambulatory Visit (INDEPENDENT_AMBULATORY_CARE_PROVIDER_SITE_OTHER): Payer: Medicare Other | Admitting: Internal Medicine

## 2015-03-02 ENCOUNTER — Encounter: Payer: Self-pay | Admitting: Internal Medicine

## 2015-03-02 VITALS — BP 134/74 | Temp 98.8°F | Ht 68.0 in | Wt 148.7 lb

## 2015-03-02 DIAGNOSIS — L03116 Cellulitis of left lower limb: Secondary | ICD-10-CM

## 2015-03-02 DIAGNOSIS — R05 Cough: Secondary | ICD-10-CM

## 2015-03-02 DIAGNOSIS — T847XXS Infection and inflammatory reaction due to other internal orthopedic prosthetic devices, implants and grafts, sequela: Secondary | ICD-10-CM | POA: Diagnosis not present

## 2015-03-02 DIAGNOSIS — R059 Cough, unspecified: Secondary | ICD-10-CM

## 2015-03-02 DIAGNOSIS — Z09 Encounter for follow-up examination after completed treatment for conditions other than malignant neoplasm: Secondary | ICD-10-CM

## 2015-03-02 DIAGNOSIS — D509 Iron deficiency anemia, unspecified: Secondary | ICD-10-CM | POA: Diagnosis not present

## 2015-03-02 DIAGNOSIS — Z2821 Immunization not carried out because of patient refusal: Secondary | ICD-10-CM

## 2015-03-02 NOTE — Progress Notes (Signed)
Pre visit review using our clinic review tool, if applicable. No additional management support is needed unless otherwise documented below in the visit note.  Chief Complaint  Patient presents with  . Follow-up    HPI: Patient comes in today as follow up from hospitalization forCellulitis and  Poss  Infected apparatus  Left ankle   Hasd surgery dr Maureen Ralphs infected  Removal of hardware  Dc 10 31  Fu dr A 11 4  Changed  to cipro 500  Bid.  Last Friday 3 days ago  . No fever  Pain gone except at wound .  Still has a minor cough without sob  That began as poss fluid overload but feels ok and no new edema .  ROS: See pertinent positives and negatives per HPI. No cp sob  No bleeding   Past Medical History  Diagnosis Date  . Unspecified vitamin D deficiency 04/06/2007  . HYPERCHOLESTEROLEMIA 05/19/2008  . 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 HEMORRHOIDS 05/19/2008  . ALLERGIC RHINITIS 01/03/2007  . ASTHMA 01/03/2007  . UTERINE PROLAPSE 04/06/2007  . OSTEOARTHRITIS 01/03/2007  . OSTEOPOROSIS 01/03/2007    hx of actonbel use for at least 5 years   . FRACTURE, ANKLE, LEFT 04/10/2009  . Blood transfusion 12 2010    hg 4 transfused 4 units  . Gastritis 2002  . DEPRESSION 01/03/2007    resolved  . Normal cardiac stress test     2014  . Family history of adverse reaction to anesthesia     pt's mother as she gets older organs shut down   . Anxiety   . GERD (gastroesophageal reflux disease)     Family History  Problem Relation Age of Onset  . Hyperlipidemia Mother   . Heart disease Mother     CABG age 71  . Ovarian cancer Mother   . Uterine cancer Mother   . Arthritis Mother   . Osteoporosis Mother   . Heart disease Father     CABG age 84  . Arthritis Father   . Melanoma Father     Social History   Social History  . Marital Status: Divorced    Spouse Name: N/A  . Number of Children: 2  . Years of Education: N/A    Social History Main Topics  . Smoking status: Never Smoker   . Smokeless tobacco: Never Used  . Alcohol Use: Yes     Comment: rare  . Drug Use: No  . Sexual Activity: Not Asked   Other Topics Concern  . None   Social History Narrative   HH of  1   To rent out room    Retired Dispensing optician   Never smoked    Pet cat allergic    Divorced   Regular exericise  walking mowing the lawnswimming   Silver sneakers        Outpatient Prescriptions Prior to Visit  Medication Sig Dispense Refill  . Calcium Citrate-Vitamin D (CITRACAL + D PO) Take 500 mg by mouth daily. Has 250IU of vitamin D3 and 80mg  of magnesium    . docusate sodium (COLACE) 100 MG capsule Take 100 mg by mouth every evening.     [name] EPINEPHrine 0.3 mg/0.3 mL IJ SOAJ injection Inject 0.3 mLs (0.3 mg total) into the muscle once. 1 Device 0  . ferrous gluconate (FERGON) 225 (27 FE) MG tablet Take 324 mg by mouth every evening.     [name]  Glucos-Chondroit-Hyaluron-MSM (GLUCOSAMINE CHONDROITIN JOINT) TABS Take 1 tablet by mouth daily.     . Menaquinone-7 (VITAMIN K2 PO) Take 1 tablet by mouth daily.     . Multiple Vitamins-Minerals (ONE-A-DAY WOMENS VITACRAVES PO) Take 1 tablet by mouth daily.    . traMADol (ULTRAM) 50 MG tablet Take 1-2 tablets (50-100 mg total) by mouth every 6 (six) hours as needed for moderate pain. 80 tablet 1  . vitamin C (ASCORBIC ACID) 500 MG tablet Take 500 mg by mouth daily.     [name] oxyCODONE (OXY IR/ROXICODONE) 5 MG immediate release tablet Take 1-2 tablets (5-10 mg total) by mouth every 3 (three) hours as needed for moderate pain or severe pain. 80 tablet 0  . methocarbamol (ROBAXIN) 500 MG tablet Take 1 tablet (500 mg total) by mouth every 6 (six) hours as needed for muscle spasms. (Patient not taking: Reported on 03/02/2015) 80 tablet 0  . KERYDIN 5 % SOLN Apply 1 application topically daily as needed (for toe nail fungus).     . Phenylephrine-Witch Hazel (PREPARATION H) 0.25-50 % GEL Place 1 application  rectally 3 (three) times daily as needed (for irritation).    . polyethylene glycol (MIRALAX / GLYCOLAX) packet Take 17 g by mouth daily. Hold if develops diarrhea. (Patient not taking: Reported on 02/21/2015) 14 each 0   No facility-administered medications prior to visit.     EXAM:  BP 134/74 mmHg  Temp(Src) 98.8 F (37.1 C) (Oral)  Ht 5\' 8"  (1.727 m)  Wt 148 lb 11.2 oz (67.45 kg)  BMI 22.62 kg/m2  SpO2 98%  Body mass index is 22.62 kg/(m^2).  GENERAL: vitals reviewed and listed above, alert, oriented, appears well hydrated and in no acute distress minor upper airway irritative cough noted  HEENT: atraumatic, conjunctiva  clear, no obvious abnormalities on inspection of external nose and ears OP : no lesion edema or exudate  NECK: no obvious masses on inspection palpation  LUNGS: clear to auscultation bilaterally, no wheezes, rales or rhonchi, good air movement CV: HRRR, no clubbing cyanosis  MS: moves all extremities lle  1+ edema fading erythema    Weight bearing  . righ tleg no sig edema  Dressing ns in place  PSYCH: pleasant and cooperative, no obvious depression or anxiety Lab Results  Component Value Date   WBC 4.3 02/18/2015   HGB 9.3* 02/18/2015   HCT 30.4* 02/18/2015   PLT 469* 02/18/2015   GLUCOSE 100* 02/18/2015   CHOL 174 09/24/2014   TRIG 108.0 09/24/2014   HDL 51.50 09/24/2014   LDLDIRECT 141.9 08/06/2012   LDLCALC 101* 09/24/2014   ALT 12* 02/18/2015   AST 22 02/18/2015   NA 137 02/18/2015   K 4.0 02/18/2015   CL 99* 02/18/2015   CREATININE 0.70 02/18/2015   BUN 29* 02/18/2015   CO2 31 02/18/2015   TSH 1.56 09/24/2014   INR 1.04 04/10/2009   Wt Readings from Last 3 Encounters:  03/02/15 148 lb 11.2 oz (67.45 kg)  02/21/15 151 lb (68.493 kg)  02/18/15 151 lb (68.493 kg)    ASSESSMENT AND PLAN:  Discussed the following assessment and plan:  Cellulitis of left lower extremity - hardware infection  removed convcalescing   noevidencre of  underlying cv problem , pseudomonas culture pos  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  -Patient advised to return or notify health care team  if symptoms worsen ,persist or new concerns arise. Total visit 57mins > 50% spent counseling and coordinating care as indicated in above note and in instructions to patient .   Patient Instructions  Exam is reassuring  .  Chest exam is normal . Fu with dr Maureen Ralphs   as planned . If cough  persistent or progressive  Get back with [place]  For evaluation.   Keep appt with  Hematology  As planned and take your iron.   Standley Brooking. Kenyata Guess M.D.

## 2015-03-02 NOTE — Patient Instructions (Signed)
Exam is reassuring  .  Chest exam is normal . Fu with dr Maureen Ralphs   as planned . If cough  persistent or progressive  Get back with [place]  For evaluation.   Keep appt with  Hematology  As planned and take your iron.

## 2015-03-30 ENCOUNTER — Telehealth: Payer: Self-pay | Admitting: *Deleted

## 2015-03-30 NOTE — Telephone Encounter (Signed)
-----   Message from [name], MD sent at 03/30/2015  9:22 AM EST ----- Regarding: No need appt I reviewed her most recent labs She does not need to come back If OK with her, will cancel her appt tomorrow

## 2015-03-30 NOTE — Telephone Encounter (Signed)
Informed pt Dr. Alvy Bimler has reviewed her labs and no need to keep appt as scheduled tomorrow.  She can continue to follow w/ her PCP and call us if her condition changes and she needs to be seen by Dr. Alvy Bimler again in the future.  At this point,  No need to see Dr. Alvy Bimler.   Pt verbalized understanding,  Agrees to follow w/ Dr. Regis Bill.

## 2015-03-31 ENCOUNTER — Other Ambulatory Visit: Payer: Medicare Other

## 2015-03-31 ENCOUNTER — Ambulatory Visit: Payer: Self-pay | Admitting: Hematology and Oncology

## 2015-05-14 ENCOUNTER — Encounter: Payer: Self-pay | Admitting: Gastroenterology

## 2015-05-14 ENCOUNTER — Encounter: Payer: Self-pay | Admitting: Internal Medicine

## 2015-08-24 ENCOUNTER — Telehealth: Payer: Self-pay | Admitting: Internal Medicine

## 2015-08-24 NOTE — Telephone Encounter (Signed)
Pt has bone density test sch for 09-11-15 at [facility] and needs order fax to them. Pt las bone density test was about 2 years ago per pt

## 2015-08-25 NOTE — Telephone Encounter (Signed)
Order sent to [facility] by fax.

## 2015-09-11 LAB — HM MAMMOGRAPHY

## 2015-09-11 LAB — HM DEXA SCAN

## 2015-09-14 ENCOUNTER — Encounter: Payer: Self-pay | Admitting: Internal Medicine

## 2015-09-17 ENCOUNTER — Encounter: Payer: Self-pay | Admitting: *Deleted

## 2015-09-30 ENCOUNTER — Other Ambulatory Visit (INDEPENDENT_AMBULATORY_CARE_PROVIDER_SITE_OTHER): Payer: Medicare Other

## 2015-09-30 DIAGNOSIS — Z0000 Encounter for general adult medical examination without abnormal findings: Secondary | ICD-10-CM | POA: Diagnosis not present

## 2015-09-30 LAB — CBC WITH DIFFERENTIAL/PLATELET
Basophils Absolute: 0 10*3/uL (ref 0.0–0.1)
Basophils Relative: 1 % (ref 0.0–3.0)
Eosinophils Absolute: 0.1 10*3/uL (ref 0.0–0.7)
Eosinophils Relative: 3.2 % (ref 0.0–5.0)
HCT: 37.8 % (ref 36.0–46.0)
Hemoglobin: 12.2 g/dL (ref 12.0–15.0)
Lymphocytes Relative: 36.8 % (ref 12.0–46.0)
Lymphs Abs: 1.3 10*3/uL (ref 0.7–4.0)
MCHC: 32.2 g/dL (ref 30.0–36.0)
MCV: 86 fl (ref 78.0–100.0)
Monocytes Absolute: 0.3 10*3/uL (ref 0.1–1.0)
Monocytes Relative: 7.4 % (ref 3.0–12.0)
Neutro Abs: 1.8 10*3/uL (ref 1.4–7.7)
Neutrophils Relative %: 51.6 % (ref 43.0–77.0)
Platelets: 163 10*3/uL (ref 150.0–400.0)
RBC: 4.39 Mil/uL (ref 3.87–5.11)
RDW: 16 % — ABNORMAL HIGH (ref 11.5–15.5)
WBC: 3.6 10*3/uL — ABNORMAL LOW (ref 4.0–10.5)

## 2015-09-30 LAB — LIPID PANEL
Cholesterol: 229 mg/dL — ABNORMAL HIGH (ref 0–200)
HDL: 70.3 mg/dL (ref >39.00)
LDL Cholesterol: 146 mg/dL — ABNORMAL HIGH (ref 0–99)
NonHDL: 159.13
Total CHOL/HDL Ratio: 3
Triglycerides: 66 mg/dL (ref 0.0–149.0)
VLDL: 13.2 mg/dL (ref 0.0–40.0)

## 2015-09-30 LAB — HEPATIC FUNCTION PANEL
ALT: 16 U/L (ref 0–35)
AST: 27 U/L (ref 0–37)
Albumin: 4.4 g/dL (ref 3.5–5.2)
Alkaline Phosphatase: 70 U/L (ref 39–117)
Bilirubin, Direct: 0.1 mg/dL (ref 0.0–0.3)
Total Bilirubin: 0.5 mg/dL (ref 0.2–1.2)
Total Protein: 7.3 g/dL (ref 6.0–8.3)

## 2015-09-30 LAB — BASIC METABOLIC PANEL
BUN: 22 mg/dL (ref 6–23)
CO2: 32 mEq/L (ref 19–32)
Calcium: 9.5 mg/dL (ref 8.4–10.5)
Chloride: 103 mEq/L (ref 96–112)
Creatinine, Ser: 0.82 mg/dL (ref 0.40–1.20)
GFR: 72.5 mL/min (ref >60.00)
Glucose, Bld: 80 mg/dL (ref 70–99)
Potassium: 4.3 mEq/L (ref 3.5–5.1)
Sodium: 141 mEq/L (ref 135–145)

## 2015-09-30 LAB — TSH: TSH: 1.08 u[IU]/mL (ref 0.35–4.50)

## 2015-10-06 NOTE — Patient Instructions (Addendum)
Continue iron as planned.    Let us know if  Want to do consult or begin   Osteoporosis   Treatment.  Get dexa in 2 years .      Health Maintenance, Female Adopting a healthy lifestyle and getting preventive care can go a long way to promote health and wellness. Talk with your health care provider about what schedule of regular examinations is right for you. This is a good chance for you to check in with your provider about disease prevention and staying healthy. In between checkups, there are plenty of things you can do on your own. Experts have done a lot of research about which lifestyle changes and preventive measures are most likely to keep you healthy. Ask your health care provider for more information. WEIGHT AND DIET  Eat a healthy diet  Be sure to include plenty of vegetables, fruits, low-fat dairy products, and lean protein.  Do not eat a lot of foods high in solid fats, added sugars, or salt.  Get regular exercise. This is one of the most important things you can do for your health.  Most adults should exercise for at least 150 minutes each week. The exercise should increase your heart rate and make you sweat (moderate-intensity exercise).  Most adults should also do strengthening exercises at least twice a week. This is in addition to the moderate-intensity exercise.  Maintain a healthy weight  Body mass index (BMI) is a measurement that can be used to identify possible weight problems. It estimates body fat based on height and weight. Your health care provider can help determine your BMI and help you achieve or maintain a healthy weight.  For females 19 years of age and older:   A BMI below 18.5 is considered underweight.  A BMI of 18.5 to 24.9 is normal.  A BMI of 25 to 29.9 is considered overweight.  A BMI of 30 and above is considered obese.  Watch levels of cholesterol and blood lipids  You should start having your blood tested for lipids and cholesterol at  74 years of age, then have this test every 5 years.  You may need to have your cholesterol levels checked more often if:  Your lipid or cholesterol levels are high.  You are older than 74 years of age.  You are at high risk for heart disease.  CANCER SCREENING   Lung Cancer  Lung cancer screening is recommended for adults 73-49 years old who are at high risk for lung cancer because of a history of smoking.  A yearly low-dose CT scan of the lungs is recommended for people who:  Currently smoke.  Have quit within the past 15 years.  Have at least a 30-pack-year history of smoking. A pack year is smoking an average of one pack of cigarettes a day for 1 year.  Yearly screening should continue until it has been 15 years since you quit.  Yearly screening should stop if you develop a health problem that would prevent you from having lung cancer treatment.  Breast Cancer  Practice breast self-awareness. This means understanding how your breasts normally appear and feel.  It also means doing regular breast self-exams. Let your health care provider know about any changes, no matter how small.  If you are in your 20s or 30s, you should have a clinical breast exam (CBE) by a health care provider every 1-3 years as part of a regular health exam.  If you are 40 or older,  have a CBE every year. Also consider having a breast X-ray (mammogram) every year.  If you have a family history of breast cancer, talk to your health care provider about genetic screening.  If you are at high risk for breast cancer, talk to your health care provider about having an MRI and a mammogram every year.  Breast cancer gene (BRCA) assessment is recommended for women who have family members with BRCA-related cancers. BRCA-related cancers include:  Breast.  Ovarian.  Tubal.  Peritoneal cancers.  Results of the assessment will determine the need for genetic counseling and BRCA1 and BRCA2  testing. Cervical Cancer Your health care provider may recommend that you be screened regularly for cancer of the pelvic organs (ovaries, uterus, and vagina). This screening involves a pelvic examination, including checking for microscopic changes to the surface of your cervix (Pap test). You may be encouraged to have this screening done every 3 years, beginning at age 20.  For women ages 62-65, health care providers may recommend pelvic exams and Pap testing every 3 years, or they may recommend the Pap and pelvic exam, combined with testing for human papilloma virus (HPV), every 5 years. Some types of HPV increase your risk of cervical cancer. Testing for HPV may also be done on women of any age with unclear Pap test results.  Other health care providers may not recommend any screening for nonpregnant women who are considered low risk for pelvic cancer and who do not have symptoms. Ask your health care provider if a screening pelvic exam is right for you.  If you have had past treatment for cervical cancer or a condition that could lead to cancer, you need Pap tests and screening for cancer for at least 20 years after your treatment. If Pap tests have been discontinued, your risk factors (such as having a new sexual partner) need to be reassessed to determine if screening should resume. Some women have medical problems that increase the chance of getting cervical cancer. In these cases, your health care provider may recommend more frequent screening and Pap tests. Colorectal Cancer  This type of cancer can be detected and often prevented.  Routine colorectal cancer screening usually begins at 74 years of age and continues through 74 years of age.  Your health care provider may recommend screening at an earlier age if you have risk factors for colon cancer.  Your health care provider may also recommend using home test kits to check for hidden blood in the stool.  A small camera at the end of a  tube can be used to examine your colon directly (sigmoidoscopy or colonoscopy). This is done to check for the earliest forms of colorectal cancer.  Routine screening usually begins at age 36.  Direct examination of the colon should be repeated every 5-10 years through 74 years of age. However, you may need to be screened more often if early forms of precancerous polyps or small growths are found. Skin Cancer  Check your skin from head to toe regularly.  Tell your health care provider about any new moles or changes in moles, especially if there is a change in a mole's shape or color.  Also tell your health care provider if you have a mole that is larger than the size of a pencil eraser.  Always use sunscreen. Apply sunscreen liberally and repeatedly throughout the day.  Protect yourself by wearing long sleeves, pants, a wide-brimmed hat, and sunglasses whenever you are outside. HEART DISEASE, DIABETES, AND  HIGH BLOOD PRESSURE   High blood pressure causes heart disease and increases the risk of stroke. High blood pressure is more likely to develop in:  People who have blood pressure in the high end of the normal range (130-139/85-89 mm Hg).  People who are overweight or obese.  People who are African American.  If you are 3-60 years of age, have your blood pressure checked every 3-5 years. If you are 36 years of age or older, have your blood pressure checked every year. You should have your blood pressure measured twice--once when you are at a hospital or clinic, and once when you are not at a hospital or clinic. Record the average of the two measurements. To check your blood pressure when you are not at a hospital or clinic, you can use:  An automated blood pressure machine at a pharmacy.  A home blood pressure monitor.  If you are between 31 years and 80 years old, ask your health care provider if you should take aspirin to prevent strokes.  Have regular diabetes screenings. This  involves taking a blood sample to check your fasting blood sugar level.  If you are at a normal weight and have a low risk for diabetes, have this test once every three years after 74 years of age.  If you are overweight and have a high risk for diabetes, consider being tested at a younger age or more often. PREVENTING INFECTION  Hepatitis B  If you have a higher risk for hepatitis B, you should be screened for this virus. You are considered at high risk for hepatitis B if:  You were born in a country where hepatitis B is common. Ask your health care provider which countries are considered high risk.  Your parents were born in a high-risk country, and you have not been immunized against hepatitis B (hepatitis B vaccine).  You have HIV or AIDS.  You use needles to inject street drugs.  You live with someone who has hepatitis B.  You have had sex with someone who has hepatitis B.  You get hemodialysis treatment.  You take certain medicines for conditions, including cancer, organ transplantation, and autoimmune conditions. Hepatitis C  Blood testing is recommended for:  Everyone born from [birth year] through 1964.  Anyone with known risk factors for hepatitis C. Sexually transmitted infections (STIs)  You should be screened for sexually transmitted infections (STIs) including gonorrhea and chlamydia if:  You are sexually active and are younger than 74 years of age.  You are older than 74 years of age and your health care provider tells you that you are at risk for this type of infection.  Your sexual activity has changed since you were last screened and you are at an increased risk for chlamydia or gonorrhea. Ask your health care provider if you are at risk.  If you do not have HIV, but are at risk, it may be recommended that you take a prescription medicine daily to prevent HIV infection. This is called pre-exposure prophylaxis (PrEP). You are considered at risk if:  You are  sexually active and do not regularly use condoms or know the HIV status of your partner(s).  You take drugs by injection.  You are sexually active with a partner who has HIV. Talk with your health care provider about whether you are at high risk of being infected with HIV. If you choose to begin PrEP, you should first be tested for HIV. You should then be tested  every 3 months for as long as you are taking PrEP.  PREGNANCY   If you are premenopausal and you may become pregnant, ask your health care provider about preconception counseling.  If you may become pregnant, take 400 to 800 micrograms (mcg) of folic acid every day.  If you want to prevent pregnancy, talk to your health care provider about birth control (contraception). OSTEOPOROSIS AND MENOPAUSE   Osteoporosis is a disease in which the bones lose minerals and strength with aging. This can result in serious bone fractures. Your risk for osteoporosis can be identified using a bone density scan.  If you are 64 years of age or older, or if you are at risk for osteoporosis and fractures, ask your health care provider if you should be screened.  Ask your health care provider whether you should take a calcium or vitamin D supplement to lower your risk for osteoporosis.  Menopause may have certain physical symptoms and risks.  Hormone replacement therapy may reduce some of these symptoms and risks. Talk to your health care provider about whether hormone replacement therapy is right for you.  HOME CARE INSTRUCTIONS   Schedule regular health, dental, and eye exams.  Stay current with your immunizations.   Do not use any tobacco products including cigarettes, chewing tobacco, or electronic cigarettes.  If you are pregnant, do not drink alcohol.  If you are breastfeeding, limit how much and how often you drink alcohol.  Limit alcohol intake to no more than 1 drink per day for nonpregnant women. One drink equals 12 ounces of beer, 5  ounces of wine, or 1 ounces of hard liquor.  Do not use street drugs.  Do not share needles.  Ask your health care provider for help if you need support or information about quitting drugs.  Tell your health care provider if you often feel depressed.  Tell your health care provider if you have ever been abused or do not feel safe at home.   This information is not intended to replace advice given to you by your health care provider. Make sure you discuss any questions you have with your health care provider.   Document Released: 10/25/2010 Document Revised: 05/02/2014 Document Reviewed: 03/13/2013 [employer] Interactive Patient Education [employer].

## 2015-10-06 NOTE — Progress Notes (Signed)
Pre visit review using our clinic review tool, if applicable. No additional management support is needed unless otherwise documented below in the visit note.  Chief Complaint  Patient presents with  . Medicare Wellness    HPI: Belinda Harper 75 y.o. comes in today for Preventive Medicare wellness visit .Since last visit. Anemia Taking iron  No bleeding  No cv pulm sx   Left ankle  better   Lateral hardware removed.  Still falls  And bumps into thinks is active .   bruise under left arm .   Taking  Vit d and K2?  For bones     Wearing lifts   To help with gait   . Still   Runs into things   Numbness  toes the same left more than right  Toe nail fungus took med and issues with insurance and dr Allyson Sabal sending in rx. Not that helpful   Mom had osteoporosis   Never had a frx long lived.  She took actonel for a while and had bone pain   ? How many years 5 or less .  leary of meds  More concern about se than consequence of OP at this time.   Health Maintenance  Topic Date Due  . INFLUENZA VACCINE  11/24/2015  . MAMMOGRAM  09/10/2017  . TETANUS/TDAP  06/23/2019  . COLONOSCOPY  08/29/2021  . DEXA SCAN  Completed  . ZOSTAVAX  Completed  . PNA vac Low Risk Adult  Completed   Health Maintenance Review LIFESTYLE:  Tno AD: ocass etoh rare Sugar beverages:no Sleep: 6-7  Has cat  MEDICARE DOCUMENT QUESTIONS  TO SCAN [place]   Hearing: ok  Vision:  No limitations at present . Last eye check UTD  Safety:  Has smoke detector and wears seat belts.  No firearms. No excess sun exposure. Sees dentist regularly.  Falls:  amonth ago  Falls tripoped over  [place]  No injury .  Advance directive :  Reviewed  Has one.  Memory: Felt to be good  , no concern from her or her family.  Depression: No anhedonia unusual crying or depressive symptoms  Nutrition: Eats well balanced diet; adequate calcium and vitamin D. No swallowing chewing problems.  Injury: no major injuries in  the last six months.  Other healthcare providers:  Reviewed today .  Social:  Lives hh of 1  frined and  Downstairs  cat   Preventive parameters: up-to-date  Reviewed   ADLS:   There are no problems or need for assistance  driving, feeding, obtaining food, dressing, toileting and bathing, managing money using phone. She is independent.    ROS:  GEN/ HEENT: No fever, significant weight changes sweats headaches vision problems hearing changes, CV/ PULM; No chest pain shortness of breath cough, syncope,edema  change in exercise tolerance. GI /GU: No adominal pain, vomiting, change in bowel habits. No blood in the stool. No significant GU symptoms. SKIN/HEME: ,no acute skin rashes suspicious lesions or bleeding. No lymphadenopathy, nodules, masses.  NEURO/ PSYCH:  No neurologic signs such as weakness numbness. No depression anxiety. IMM/ Allergy: No unusual infections.  Allergy .   REST of 12 system review negative except as per HPI   Past Medical History  Diagnosis Date  . Unspecified vitamin D deficiency 04/06/2007  . HYPERCHOLESTEROLEMIA 05/19/2008  . 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 HEMORRHOIDS 05/19/2008  . ALLERGIC RHINITIS 01/03/2007  . ASTHMA 01/03/2007  .  UTERINE PROLAPSE 04/06/2007  . OSTEOARTHRITIS 01/03/2007  . OSTEOPOROSIS 01/03/2007    hx of actonbel use for at least 5 years   . FRACTURE, ANKLE, LEFT 04/10/2009  . Blood transfusion 12 2010    hg 4 transfused 4 units  . Gastritis 2002  . DEPRESSION 01/03/2007    resolved  . Normal cardiac stress test     2014  . Family history of adverse reaction to anesthesia     pt's mother as she gets older organs shut down   . Anxiety   . GERD (gastroesophageal reflux disease)   . Sepsis due to cellulitis ([place]) 02/05/2015    Family History  Problem Relation Age of Onset  . Hyperlipidemia Mother   . Heart disease Mother     CABG age 37  . Ovarian cancer Mother    . Uterine cancer Mother   . Arthritis Mother   . Osteoporosis Mother   . Heart disease Father     CABG age 34  . Arthritis Father   . Melanoma Father     Social History   Social History  . Marital Status: Divorced    Spouse Name: N/A  . Number of Children: 2  . Years of Education: N/A   Social History Main Topics  . Smoking status: Never Smoker   . Smokeless tobacco: Never Used  . Alcohol Use: Yes     Comment: rare  . Drug Use: No  . Sexual Activity: Not Asked   Other Topics Concern  . None   Social History Narrative   HH of  1   To rent out room    Retired Dispensing optician   Never smoked    Pet cat allergic    Divorced   Regular exericise  walking mowing the lawnswimming   Silver sneakers        Outpatient Encounter Prescriptions as of 10/07/2015  Medication Sig  . Calcium Citrate-Vitamin D (CITRACAL + D PO) Take 500 mg by mouth daily. Has 250IU of vitamin D3 and 29m of magnesium  . docusate sodium (COLACE) 100 MG capsule Take 100 mg by mouth every evening.   .Marland KitchenEPINEPHrine 0.3 mg/0.3 mL IJ SOAJ injection Inject 0.3 mLs (0.3 mg total) into the muscle once.  . ferrous gluconate (FERGON) 225 (27 FE) MG tablet Take 324 mg by mouth every evening.   . Glucos-Chondroit-Hyaluron-MSM (GLUCOSAMINE CHONDROITIN JOINT) TABS Take 1 tablet by mouth daily.   . Multiple Vitamins-Minerals (ONE-A-DAY WOMENS VITACRAVES PO) Take 1 tablet by mouth daily.  . vitamin C (ASCORBIC ACID) 500 MG tablet Take 500 mg by mouth daily.   . [DISCONTINUED] ciprofloxacin (CIPRO) 500 MG tablet Take 1 tablet by mouth 2 (two) times daily.  . [DISCONTINUED] Menaquinone-7 (VITAMIN K2 PO) Take 1 tablet by mouth daily.   . [DISCONTINUED] methocarbamol (ROBAXIN) 500 MG tablet Take 1 tablet (500 mg total) by mouth every 6 (six) hours as needed for muscle spasms. (Patient not taking: Reported on 03/02/2015)  . [DISCONTINUED] traMADol (ULTRAM) 50 MG tablet Take 1-2 tablets (50-100 mg total) by mouth every 6  (six) hours as needed for moderate pain.   No facility-administered encounter medications on file as of 10/07/2015.    EXAM:  BP 156/84 mmHg  Temp(Src) 98.1 F (36.7 C) (Oral)  Ht 5' 7.25" (1.708 m)  Wt 154 lb 1.6 oz (69.899 kg)  BMI 23.96 kg/m2  Body mass index is 23.96 kg/(m^2).  Physical Exam: Vital signs reviewed GIRC:VELFis a well-developed well-nourished  alert cooperative   who appears stated age in no acute distress.  HEENT: normocephalic atraumatic , Eyes: PERRL EOM's full, conjunctiva clear, Nares: paten,t no deformity discharge or tenderness., Ears: no deformity EAC's clear TMs with normal landmarks. Mouth: clear OP, no lesions, edema.  Moist mucous membranes. Dentition in adequate repair. NECK: supple without masses, thyromegaly or bruits. CHEST/PULM:  Clear to auscultation and percussion breath sounds equal no wheeze , rales or rhonchi. No chest wall deformities or tenderness.Breast: normal by inspection . No dimpling, discharge, masses, tenderness or discharge .CV: PMI is nondisplaced, S1 S2 no gallops, murmurs, rubs. Peripheral pulses are full without delay.No JVD .  ABDOMEN: Bowel sounds normal nontender  No guard or rebound, no hepato splenomegal no CVA tenderness.   Extremtities:  No clubbing cyanosis or edema, no acute joint swelling or redness no focal atrophy left ankle well healed scar   Dec rom .  NEURO:  Oriented x3, cranial nerves 3-12 appear to be intact, no obvious focal weaknessSKIN: No acute rashes normal turgor, color, left arm has  bruising no echiae. noulcers    PSYCH: Oriented, good eye contact, no obvious depression anxiety, cognition and judgment appear normal. LN: no cervical axillary inguinal adenopathy No noted deficits in memory, attention, and speech.   Lab Results  Component Value Date   WBC 3.6* 09/30/2015   HGB 12.2 09/30/2015   HCT 37.8 09/30/2015   PLT 163.0 09/30/2015   GLUCOSE 80 09/30/2015   CHOL 229* 09/30/2015   TRIG 66.0  09/30/2015   HDL 70.30 09/30/2015   LDLDIRECT 141.9 08/06/2012   LDLCALC 146* 09/30/2015   ALT 16 09/30/2015   AST 27 09/30/2015   NA 141 09/30/2015   K 4.3 09/30/2015   CL 103 09/30/2015   CREATININE 0.82 09/30/2015   BUN 22 09/30/2015   CO2 32 09/30/2015   TSH 1.08 09/30/2015   INR 1.04 04/10/2009    ASSESSMENT AND PLAN:  Discussed the following assessment and plan:  Visit for preventive health examination  Medicare annual wellness visit, subsequent  Medication management  Iron deficiency anemia - Plan: Ferritin, IBC panel, CBC with Differential/Platelet  Osteoporosis - see text  add vit d to next labs - Plan: VITAMIN D 25 Hydroxy (Vit-D Deficiency, Fractures)  Elevated BP - check readings  fu if not at goal for intervnetion  Check bp readings t home  Has been ok   Come back if not at goal had stress today   Doing well in regard to her anemia her hemoglobin is the best I've seen for a while. Would recheck her CBC and ferritin IVC in 6 months with no office visit and then yearly at her checkup. Hematology is no longer seeing her because she is stable.  Discussed osteoporosis see above under history. We'll repeat her DEXA in 2 years consider Prolia as she had a side effect we think of Actonel.  She has symptoms of pelvic floor prolapse but feels okay pushing her uterus back up as needed she doesn't want surgery. Can see a gynecologist when she wants an opinion about whether other options such as pessary may be appropriate.  Reviewed falling running into things this has not been dramatically changed and she has had workups in the past suspect her leg length discrepancy although corrected now with inserts and numb toes may be adding to the situation. She'll let us know if there new symptoms or progression.  Lipids could be better  Getting back to exercise etc . Patient  Care Team: Burnis Medin, MD as PCP - General Servando Salina, MD (Obstetrics and Gynecology) Inda Castle, MD (Gastroenterology) Clent Jacks, MD (Ophthalmology) Mikey Bussing, DDS (Dentistry) Druscilla Brownie, MD as Consulting Physician (Dermatology) [name], MD as Consulting Physician (Hematology and Oncology) Gaynelle Arabian, MD as Consulting Physician (Orthopedic Surgery)  Patient Instructions  Continue iron as planned.    Let us know if  Want to do consult or begin   Osteoporosis   Treatment.  Get dexa in 2 years .      Health Maintenance, Female Adopting a healthy lifestyle and getting preventive care can go a long way to promote health and wellness. Talk with your health care provider about what schedule of regular examinations is right for you. This is a good chance for you to check in with your provider about disease prevention and staying healthy. In between checkups, there are plenty of things you can do on your own. Experts have done a lot of research about which lifestyle changes and preventive measures are most likely to keep you healthy. Ask your health care provider for more information. WEIGHT AND DIET  Eat a healthy diet  Be sure to include plenty of vegetables, fruits, low-fat dairy products, and lean protein.  Do not eat a lot of foods high in solid fats, added sugars, or salt.  Get regular exercise. This is one of the most important things you can do for your health.  Most adults should exercise for at least 150 minutes each week. The exercise should increase your heart rate and make you sweat (moderate-intensity exercise).  Most adults should also do strengthening exercises at least twice a week. This is in addition to the moderate-intensity exercise.  Maintain a healthy weight  Body mass index (BMI) is a measurement that can be used to identify possible weight problems. It estimates body fat based on height and weight. Your health care provider can help determine your BMI and help you achieve or maintain a healthy weight.  For females 1 years of age and  older:   A BMI below 18.5 is considered underweight.  A BMI of 18.5 to 24.9 is normal.  A BMI of 25 to 29.9 is considered overweight.  A BMI of 30 and above is considered obese.  Watch levels of cholesterol and blood lipids  You should start having your blood tested for lipids and cholesterol at 74 years of age, then have this test every 5 years.  You may need to have your cholesterol levels checked more often if:  Your lipid or cholesterol levels are high.  You are older than 74 years of age.  You are at high risk for heart disease.  CANCER SCREENING   Lung Cancer  Lung cancer screening is recommended for adults 18-13 years old who are at high risk for lung cancer because of a history of smoking.  A yearly low-dose CT scan of the lungs is recommended for people who:  Currently smoke.  Have quit within the past 15 years.  Have at least a 30-pack-year history of smoking. A pack year is smoking an average of one pack of cigarettes a day for 1 year.  Yearly screening should continue until it has been 15 years since you quit.  Yearly screening should stop if you develop a health problem that would prevent you from having lung cancer treatment.  Breast Cancer  Practice breast self-awareness. This means understanding how your breasts normally appear and feel.  It  also means doing regular breast self-exams. Let your health care provider know about any changes, no matter how small.  If you are in your 20s or 30s, you should have a clinical breast exam (CBE) by a health care provider every 1-3 years as part of a regular health exam.  If you are 49 or older, have a CBE every year. Also consider having a breast X-ray (mammogram) every year.  If you have a family history of breast cancer, talk to your health care provider about genetic screening.  If you are at high risk for breast cancer, talk to your health care provider about having an MRI and a mammogram every  year.  Breast cancer gene (BRCA) assessment is recommended for women who have family members with BRCA-related cancers. BRCA-related cancers include:  Breast.  Ovarian.  Tubal.  Peritoneal cancers.  Results of the assessment will determine the need for genetic counseling and BRCA1 and BRCA2 testing. Cervical Cancer Your health care provider may recommend that you be screened regularly for cancer of the pelvic organs (ovaries, uterus, and vagina). This screening involves a pelvic examination, including checking for microscopic changes to the surface of your cervix (Pap test). You may be encouraged to have this screening done every 3 years, beginning at age 64.  For women ages 50-65, health care providers may recommend pelvic exams and Pap testing every 3 years, or they may recommend the Pap and pelvic exam, combined with testing for human papilloma virus (HPV), every 5 years. Some types of HPV increase your risk of cervical cancer. Testing for HPV may also be done on women of any age with unclear Pap test results.  Other health care providers may not recommend any screening for nonpregnant women who are considered low risk for pelvic cancer and who do not have symptoms. Ask your health care provider if a screening pelvic exam is right for you.  If you have had past treatment for cervical cancer or a condition that could lead to cancer, you need Pap tests and screening for cancer for at least 20 years after your treatment. If Pap tests have been discontinued, your risk factors (such as having a new sexual partner) need to be reassessed to determine if screening should resume. Some women have medical problems that increase the chance of getting cervical cancer. In these cases, your health care provider may recommend more frequent screening and Pap tests. Colorectal Cancer  This type of cancer can be detected and often prevented.  Routine colorectal cancer screening usually begins at 74 years of  age and continues through 74 years of age.  Your health care provider may recommend screening at an earlier age if you have risk factors for colon cancer.  Your health care provider may also recommend using home test kits to check for hidden blood in the stool.  A small camera at the end of a tube can be used to examine your colon directly (sigmoidoscopy or colonoscopy). This is done to check for the earliest forms of colorectal cancer.  Routine screening usually begins at age 11.  Direct examination of the colon should be repeated every 5-10 years through 74 years of age. However, you may need to be screened more often if early forms of precancerous polyps or small growths are found. Skin Cancer  Check your skin from head to toe regularly.  Tell your health care provider about any new moles or changes in moles, especially if there is a change in a mole's  shape or color.  Also tell your health care provider if you have a mole that is larger than the size of a pencil eraser.  Always use sunscreen. Apply sunscreen liberally and repeatedly throughout the day.  Protect yourself by wearing long sleeves, pants, a wide-brimmed hat, and sunglasses whenever you are outside. HEART DISEASE, DIABETES, AND HIGH BLOOD PRESSURE   High blood pressure causes heart disease and increases the risk of stroke. High blood pressure is more likely to develop in:  People who have blood pressure in the high end of the normal range (130-139/85-89 mm Hg).  People who are overweight or obese.  People who are African American.  If you are 74-25 years of age, have your blood pressure checked every 3-5 years. If you are 72 years of age or older, have your blood pressure checked every year. You should have your blood pressure measured twice--once when you are at a hospital or clinic, and once when you are not at a hospital or clinic. Record the average of the two measurements. To check your blood pressure when you are  not at a hospital or clinic, you can use:  An automated blood pressure machine at a pharmacy.  A home blood pressure monitor.  If you are between 34 years and 71 years old, ask your health care provider if you should take aspirin to prevent strokes.  Have regular diabetes screenings. This involves taking a blood sample to check your fasting blood sugar level.  If you are at a normal weight and have a low risk for diabetes, have this test once every three years after 74 years of age.  If you are overweight and have a high risk for diabetes, consider being tested at a younger age or more often. PREVENTING INFECTION  Hepatitis B  If you have a higher risk for hepatitis B, you should be screened for this virus. You are considered at high risk for hepatitis B if:  You were born in a country where hepatitis B is common. Ask your health care provider which countries are considered high risk.  Your parents were born in a high-risk country, and you have not been immunized against hepatitis B (hepatitis B vaccine).  You have HIV or AIDS.  You use needles to inject street drugs.  You live with someone who has hepatitis B.  You have had sex with someone who has hepatitis B.  You get hemodialysis treatment.  You take certain medicines for conditions, including cancer, organ transplantation, and autoimmune conditions. Hepatitis C  Blood testing is recommended for:  Everyone born from [birth year] through 1964.  Anyone with known risk factors for hepatitis C. Sexually transmitted infections (STIs)  You should be screened for sexually transmitted infections (STIs) including gonorrhea and chlamydia if:  You are sexually active and are younger than 74 years of age.  You are older than 74 years of age and your health care provider tells you that you are at risk for this type of infection.  Your sexual activity has changed since you were last screened and you are at an increased risk for  chlamydia or gonorrhea. Ask your health care provider if you are at risk.  If you do not have HIV, but are at risk, it may be recommended that you take a prescription medicine daily to prevent HIV infection. This is called pre-exposure prophylaxis (PrEP). You are considered at risk if:  You are sexually active and do not regularly use condoms or know the  HIV status of your partner(s).  You take drugs by injection.  You are sexually active with a partner who has HIV. Talk with your health care provider about whether you are at high risk of being infected with HIV. If you choose to begin PrEP, you should first be tested for HIV. You should then be tested every 3 months for as long as you are taking PrEP.  PREGNANCY   If you are premenopausal and you may become pregnant, ask your health care provider about preconception counseling.  If you may become pregnant, take 400 to 800 micrograms (mcg) of folic acid every day.  If you want to prevent pregnancy, talk to your health care provider about birth control (contraception). OSTEOPOROSIS AND MENOPAUSE   Osteoporosis is a disease in which the bones lose minerals and strength with aging. This can result in serious bone fractures. Your risk for osteoporosis can be identified using a bone density scan.  If you are 61 years of age or older, or if you are at risk for osteoporosis and fractures, ask your health care provider if you should be screened.  Ask your health care provider whether you should take a calcium or vitamin D supplement to lower your risk for osteoporosis.  Menopause may have certain physical symptoms and risks.  Hormone replacement therapy may reduce some of these symptoms and risks. Talk to your health care provider about whether hormone replacement therapy is right for you.  HOME CARE INSTRUCTIONS   Schedule regular health, dental, and eye exams.  Stay current with your immunizations.   Do not use any tobacco products  including cigarettes, chewing tobacco, or electronic cigarettes.  If you are pregnant, do not drink alcohol.  If you are breastfeeding, limit how much and how often you drink alcohol.  Limit alcohol intake to no more than 1 drink per day for nonpregnant women. One drink equals 12 ounces of beer, 5 ounces of wine, or 1 ounces of hard liquor.  Do not use street drugs.  Do not share needles.  Ask your health care provider for help if you need support or information about quitting drugs.  Tell your health care provider if you often feel depressed.  Tell your health care provider if you have ever been abused or do not feel safe at home.   This information is not intended to replace advice given to you by your health care provider. Make sure you discuss any questions you have with your health care provider.   Document Released: 10/25/2010 Document Revised: 05/02/2014 Document Reviewed: 03/13/2013 Elsevier Interactive Patient Education 2016 [place] K. Rhonna Holster M.D.

## 2015-10-07 ENCOUNTER — Ambulatory Visit (INDEPENDENT_AMBULATORY_CARE_PROVIDER_SITE_OTHER): Payer: Medicare Other | Admitting: Internal Medicine

## 2015-10-07 ENCOUNTER — Encounter: Payer: Self-pay | Admitting: Internal Medicine

## 2015-10-07 VITALS — BP 156/84 | Temp 98.1°F | Ht 67.25 in | Wt 154.1 lb

## 2015-10-07 DIAGNOSIS — IMO0001 Reserved for inherently not codable concepts without codable children: Secondary | ICD-10-CM

## 2015-10-07 DIAGNOSIS — Z0000 Encounter for general adult medical examination without abnormal findings: Secondary | ICD-10-CM

## 2015-10-07 DIAGNOSIS — D509 Iron deficiency anemia, unspecified: Secondary | ICD-10-CM | POA: Diagnosis not present

## 2015-10-07 DIAGNOSIS — R030 Elevated blood-pressure reading, without diagnosis of hypertension: Secondary | ICD-10-CM

## 2015-10-07 DIAGNOSIS — M810 Age-related osteoporosis without current pathological fracture: Secondary | ICD-10-CM

## 2015-10-07 DIAGNOSIS — Z79899 Other long term (current) drug therapy: Secondary | ICD-10-CM | POA: Diagnosis not present

## 2015-10-07 NOTE — Assessment & Plan Note (Signed)
See dexa -2.8 LFN Discussed osteoporosis see above under history. We'll repeat her DEXA in 2 years consider Prolia as she had a side effect we think of Actonel.

## 2016-03-30 ENCOUNTER — Other Ambulatory Visit (INDEPENDENT_AMBULATORY_CARE_PROVIDER_SITE_OTHER): Payer: Medicare Other

## 2016-03-30 DIAGNOSIS — M810 Age-related osteoporosis without current pathological fracture: Secondary | ICD-10-CM

## 2016-03-30 DIAGNOSIS — D509 Iron deficiency anemia, unspecified: Secondary | ICD-10-CM | POA: Diagnosis not present

## 2016-03-30 LAB — CBC WITH DIFFERENTIAL/PLATELET
Basophils Absolute: 0 10*3/uL (ref 0.0–0.1)
Basophils Relative: 0.8 % (ref 0.0–3.0)
Eosinophils Absolute: 0.3 10*3/uL (ref 0.0–0.7)
Eosinophils Relative: 5.8 % — ABNORMAL HIGH (ref 0.0–5.0)
HCT: 35.2 % — ABNORMAL LOW (ref 36.0–46.0)
Hemoglobin: 11.6 g/dL — ABNORMAL LOW (ref 12.0–15.0)
Lymphocytes Relative: 31.3 % (ref 12.0–46.0)
Lymphs Abs: 1.5 10*3/uL (ref 0.7–4.0)
MCHC: 32.9 g/dL (ref 30.0–36.0)
MCV: 85 fl (ref 78.0–100.0)
Monocytes Absolute: 0.3 10*3/uL (ref 0.1–1.0)
Monocytes Relative: 6.9 % (ref 3.0–12.0)
Neutro Abs: 2.7 10*3/uL (ref 1.4–7.7)
Neutrophils Relative %: 55.2 % (ref 43.0–77.0)
Platelets: 168 10*3/uL (ref 150.0–400.0)
RBC: 4.13 Mil/uL (ref 3.87–5.11)
RDW: 14.9 % (ref 11.5–15.5)
WBC: 4.8 10*3/uL (ref 4.0–10.5)

## 2016-03-30 LAB — VITAMIN D 25 HYDROXY (VIT D DEFICIENCY, FRACTURES): VITD: 49.21 ng/mL (ref 30.00–100.00)

## 2016-03-30 LAB — IBC PANEL
Iron: 38 ug/dL — ABNORMAL LOW (ref 42–145)
Saturation Ratios: 11.2 % — ABNORMAL LOW (ref 20.0–50.0)
Transferrin: 242 mg/dL (ref 212.0–360.0)

## 2016-03-30 LAB — FERRITIN: Ferritin: 40.7 ng/mL (ref 10.0–291.0)

## 2016-07-21 IMAGING — MR MR ANKLE*L* WO/W CM
4 of 9 series · 18 of 40 positions shown · IV contrast (multihance)
Comparison: 02/05/2015

CLINICAL DATA: Cellulitis of the ankle. Orthopedic hardware in
place.

EXAM:
MRI OF THE LEFT ANKLE WITHOUT AND WITH CONTRAST
TECHNIQUE: Multiplanar, multisequence MR imaging of the ankle was performed
before and after the administration of intravenous contrast.
CONTRAST:  13mL MULTIHANCE GADOBENATE DIMEGLUMINE 529 MG/ML IV SOLN

[Series 2: T1 · axial · 4.0mm · 0.31mm/px · z∈[-44,+106]mm · 4 of 31 slices shown (1 of 2)]
[im 1/31]
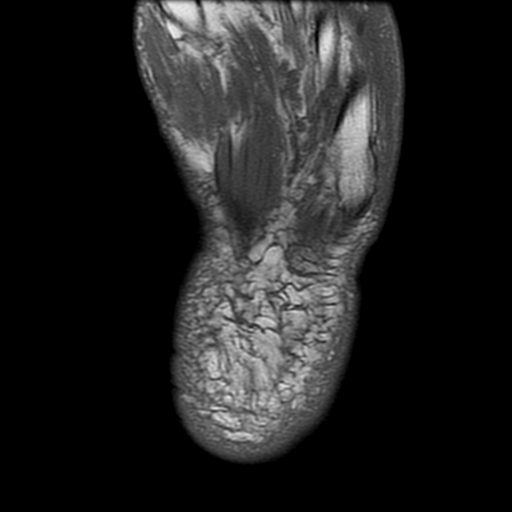
[im 11/31]
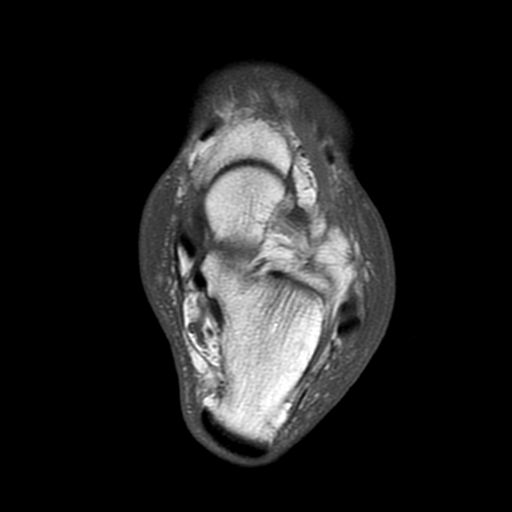
[im 21/31]
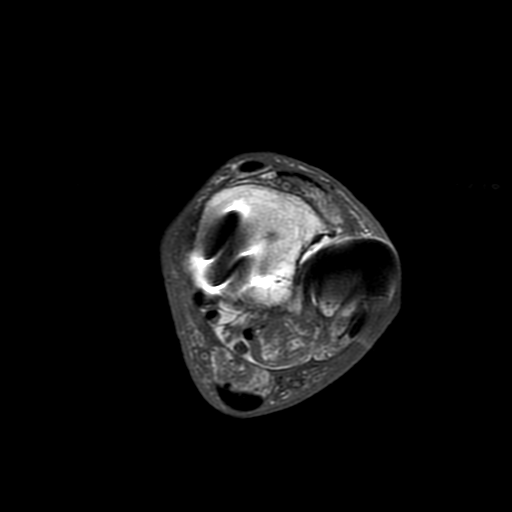
[im 31/31]
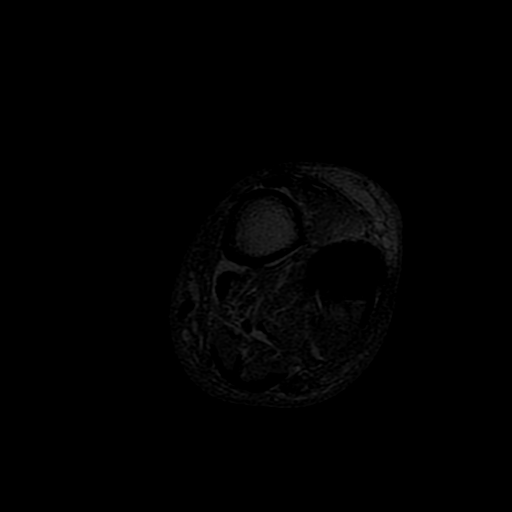

[Series 5: T1 · coronal · 4.0mm · 0.33mm/px · 5 of 33 slices shown (2 of 2)]
[im 1/33]
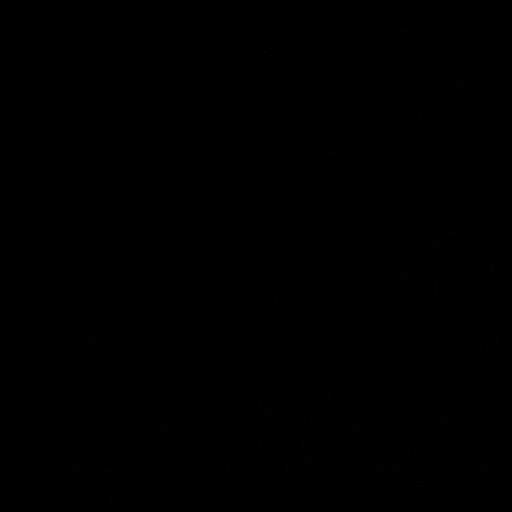
[im 9/33]
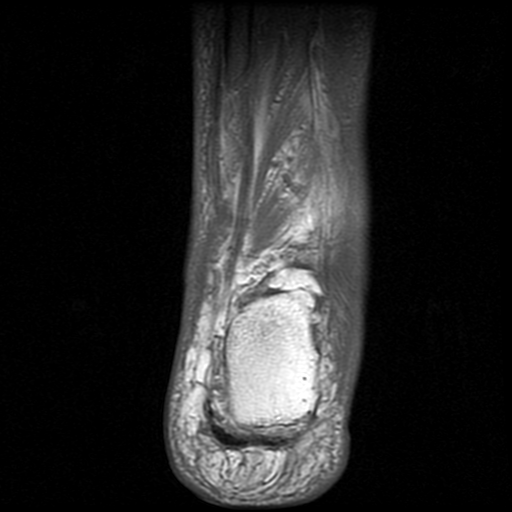
[im 17/33]
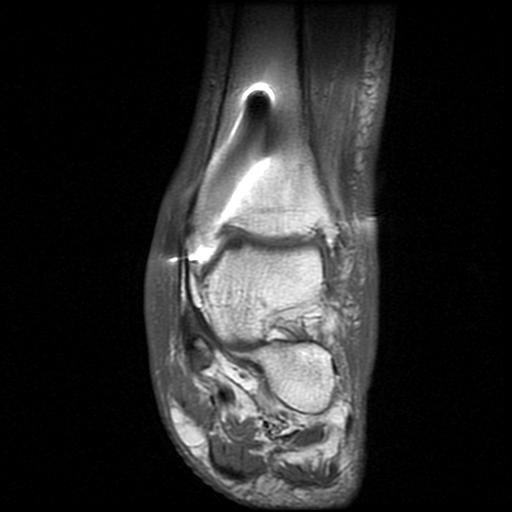
[im 25/33]
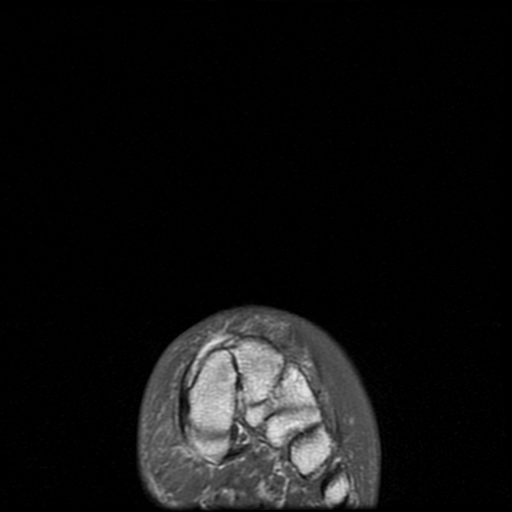
[im 33/33]
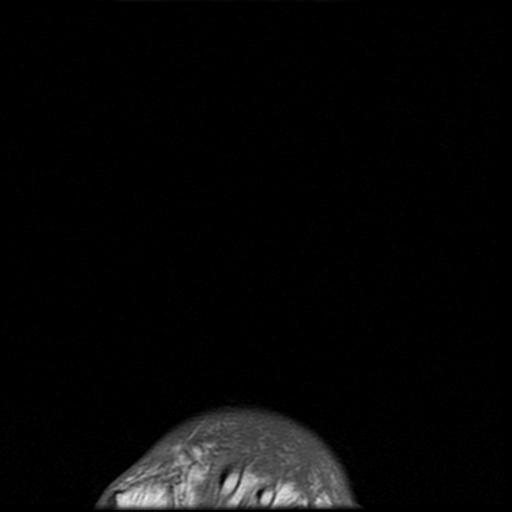

[Series 8: T1 fat-sat post-contrast · axial · 4.0mm · 0.31mm/px · z∈[-44,+106]mm · 5 of 31 slices shown]
[im 1/31]
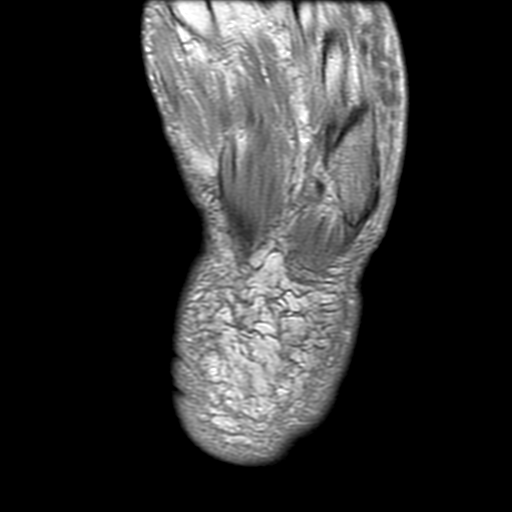
[im 8/31]
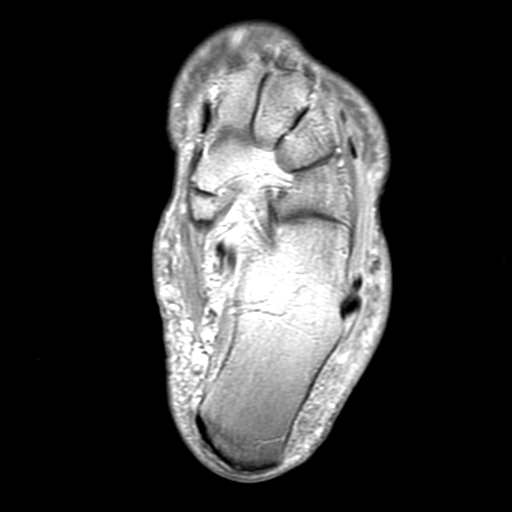
[im 16/31]
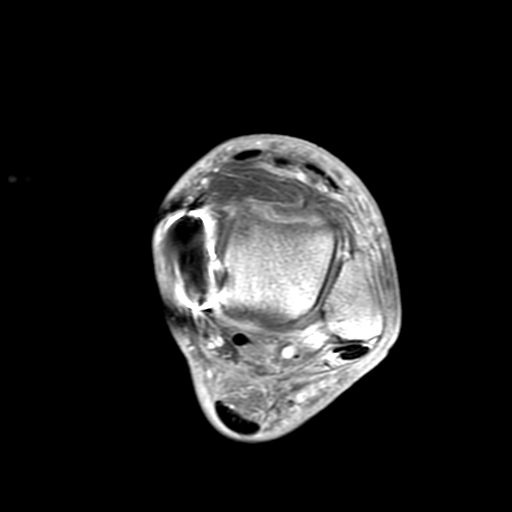
[im 23/31]
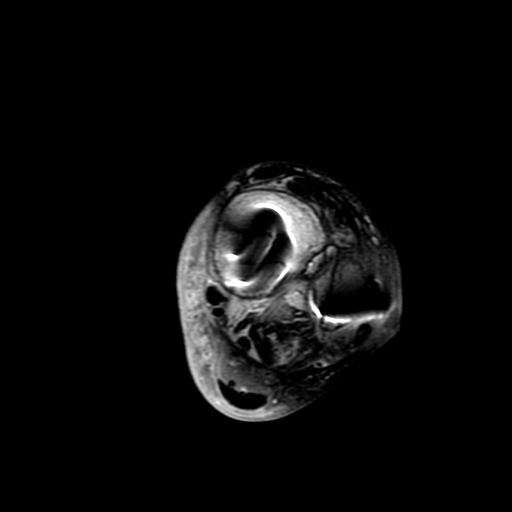
[im 31/31]
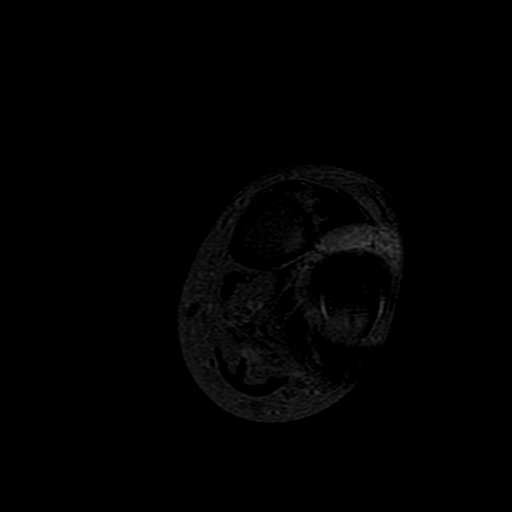

[Series 9: T1 post-contrast · coronal · 4.0mm · 0.33mm/px · 4 of 33 slices shown]
[im 1/33]
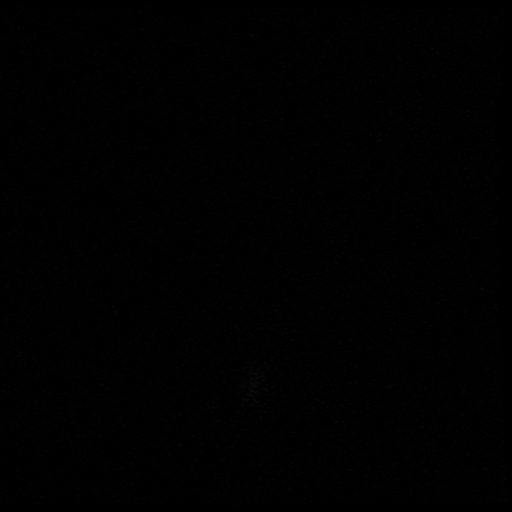
[im 9/33]
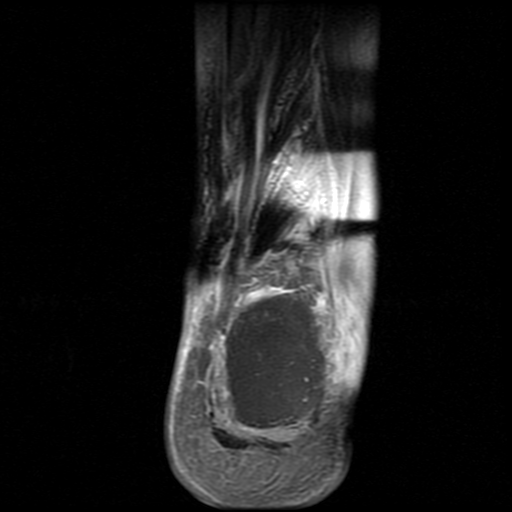
[im 17/33]
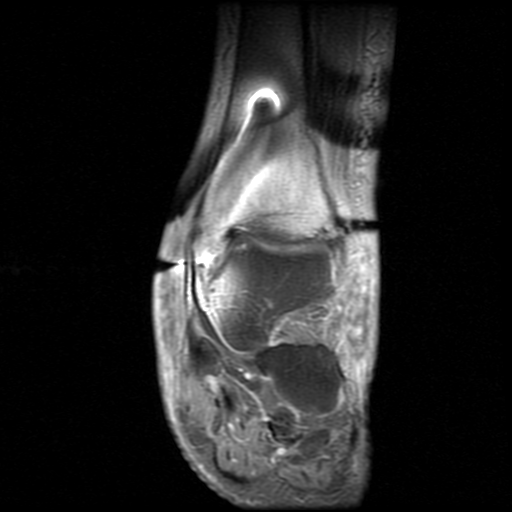
[im 33/33]
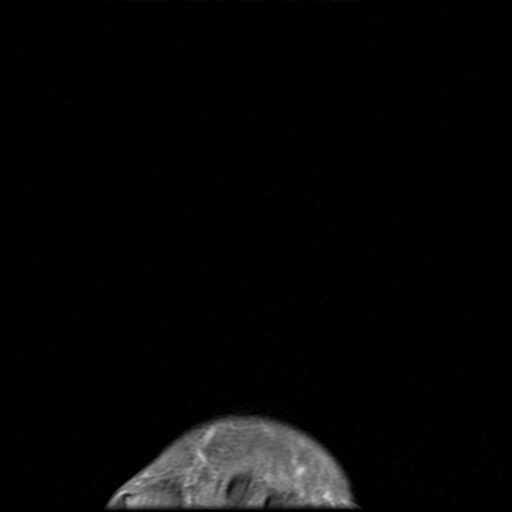

[18 of 40 positions shown; findings below may reference images not displayed]

FINDINGS: Lag screw fixators of the medial malleolus and lateral plate and
screw fixator of the lateral malleolus introduced metal artifact
which reduces diagnostic sensitivity and specificity.

TENDONS

Peroneal: Unremarkable

Posteromedial: Partially obscured but unremarkable where visualized.
Type 2 accessory navicular.

Anterior: Unremarkable

Achilles: Unremarkable

Plantar Fascia: Unremarkable

LIGAMENTS

Lateral: Inferior tibiofibular ligaments not well seen. There is
some spurring along the inferior tibiofibular articulation. Anterior
talofibular ligament is deficient.

Medial: Deep tibiotalar component of the deltoid ligament poorly
seen due to metal artifact. Grossly intact tibiospring and
tibionavicular portions. The Lisfranc ligament looks intact.

CARTILAGE

Ankle Joint: Deformity along the distal tibial articular surface
related to prior fracture. Trace edema centrally along the distal
tibia, image 19 series 6 and image 9 of series 7, particularly along
the region of cortical irregularity. This is thought to be chronic.
There is chronic chondral thinning in the tibiotalar joint without
joint effusion or findings of septic joint.

Subtalar Joints/Sinus Tarsi: Unremarkable

Bones: No findings of osteomyelitis.

Other: Subcutaneous edema is present overlying both the medial and
lateral malleoli and tracking into the dorsum of the foot, with
associated enhancement.
IMPRESSION: IMPRESSION
1. Subcutaneous edema and enhancement overlying the malleoli and
tracking in the dorsum of the foot, quite potentially representing
cellulitis. No compelling findings of osteomyelitis or fasciitis.
The potential cellulitis was less explicitly described in the brief
preliminary report issued last night, and accordingly this final
report will be called to the referring team.
2. Chronic deformity of the distal tibia, with degenerative findings
at the tibiotalar joint, and orthopedic hardware in place.
3. Type 2 accessory navicular.
4. Deficient anterior talofibular ligament, likely from remote
injury.
These results will be called to the ordering clinician or
representative by the Radiologist Assistant, and communication
documented in the PACS or zVision Dashboard.

## 2016-08-10 ENCOUNTER — Ambulatory Visit (INDEPENDENT_AMBULATORY_CARE_PROVIDER_SITE_OTHER): Payer: Medicare Other | Admitting: Adult Health

## 2016-08-10 ENCOUNTER — Encounter: Payer: Self-pay | Admitting: Adult Health

## 2016-08-10 VITALS — BP 190/92 | Temp 98.1°F | Ht 67.25 in | Wt 158.6 lb

## 2016-08-10 DIAGNOSIS — I10 Essential (primary) hypertension: Secondary | ICD-10-CM

## 2016-08-10 LAB — POC URINALSYSI DIPSTICK (AUTOMATED)
Bilirubin, UA: NEGATIVE
Blood, UA: NEGATIVE
Glucose, UA: NEGATIVE
Ketones, UA: NEGATIVE
Leukocytes, UA: NEGATIVE
Nitrite, UA: NEGATIVE
Protein, UA: NEGATIVE
Spec Grav, UA: 1.01 (ref 1.010–1.025)
Urobilinogen, UA: 0.2 E.U./dL
pH, UA: 7 (ref 5.0–8.0)

## 2016-08-10 MED ORDER — AMLODIPINE BESYLATE 10 MG PO TABS: 10.0000 mg | ORAL_TABLET | Freq: Every day | ORAL | 1 refills | Status: DC

## 2016-08-10 MED ORDER — CLONIDINE HCL 0.1 MG PO TABS: 0.1000 mg | ORAL_TABLET | Freq: Once | ORAL | Status: DC

## 2016-08-10 MED ORDER — AMLODIPINE BESYLATE 5 MG PO TABS: 5.0000 mg | ORAL_TABLET | Freq: Every day | ORAL | 3 refills | Status: DC

## 2016-08-10 NOTE — Patient Instructions (Signed)
I will follow up with you on your blood work   I have sent in a prescription for Norvasc to take daily to help control your blood pressure.   Please follow up with Dr. Regis Bill on [place] next week   Follow up in the ER with any blurred vision, worsening headache, or stroke like symptoms

## 2016-08-10 NOTE — Progress Notes (Signed)
Subjective:    Patient ID: Belinda Harper, female    DOB: 07-29-1940, 76 y.o.   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  She is a patient of Dr. Regis Bill who I am seeing today for the first time. Her complaint is that of elevated blood pressure readings at home. Per patient she has been checking her blood pressure at home this morning after becoming dizzy while packing a suitcase to go out of town, at this time her blood pressure was 180/90. She continued to check her blood pressure over the next hour and each time she checked her blood pressure it continued to raise, with the highest being 223/85.   She continues to feel dizzy. She does not have any blurred vision or headaches.   She denies any chest pain or SOB.   BP Readings from Last 3 Encounters:  08/10/16 (!) 212/98  10/07/15 (!) 156/84  03/02/15 134/74    Review of Systems See HPI   Past Medical History:  Diagnosis Date  . 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 ALLERGIC RHINITIS 01/03/2007  . 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 Anxiety   . ASTHMA 01/03/2007  . Blood transfusion 12 2010   hg 4 transfused 4 units  . DEPRESSION 01/03/2007   resolved  . Family history of adverse reaction to anesthesia    pt's mother as she gets older organs shut down   .  FRACTURE, ANKLE, LEFT 04/10/2009  . Gastritis 2002  . GERD (gastroesophageal reflux disease)   . HEMORRHOIDS 05/19/2008  . HYPERCHOLESTEROLEMIA 05/19/2008  . Normal cardiac stress test    2014  . OSTEOARTHRITIS 01/03/2007  . OSTEOPOROSIS 01/03/2007   hx of actonbel use for at least 5 years   . Sepsis due to cellulitis ([place]) 02/05/2015  . Unspecified vitamin D deficiency 04/06/2007  . UTERINE PROLAPSE 04/06/2007    Social History   Social History  . Marital status: Divorced    Spouse name: N/A  . Number of children: 2  . Years of education: N/A   Occupational History  . Not on file.   Social History Main Topics  . Smoking status: Never Smoker  . Smokeless tobacco: Never Used  . Alcohol use Yes     Comment: rare  . Drug use: No  . Sexual activity: Not on file   Other Topics Concern  . Not on file   Social History Narrative   HH of  1   To rent out room    Retired Dispensing optician   Never smoked    Pet cat allergic    Divorced   Regular exericise  walking mowing the lawnswimming   Silver sneakers        Past Surgical History:  Procedure Laterality Date  . ANKLE FRACTURE SURGERY  02/2009   left ankle fracture and dislocation  . CATARACT EXTRACTION W/ INTRAOCULAR LENS IMPLANT  2000   right eye  . 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 ORIF FINGER / THUMB FRACTURE  1970   reattached  . PANENDOSCOPY     small bowel bx    Family History  Problem Relation Age of Onset  . Hyperlipidemia Mother   . Heart disease Mother     CABG age 73  . Ovarian cancer Mother   . Uterine cancer Mother     . Arthritis Mother   . Osteoporosis Mother   . Heart disease Father     CABG age 24  . Arthritis Father   . Melanoma Father     Allergies  Allergen Reactions  . Other Other (See Comments)    YELLOW JACKETS - blood pressure dropped, passed out  . Sulfa Antibiotics Rash  . Sulfamethoxazole Rash    Current Outpatient Prescriptions on File Prior to Visit  Medication Sig Dispense Refill  . docusate sodium (COLACE) 100 MG capsule Take 100 mg by mouth every evening.     [name] EPINEPHrine 0.3 mg/0.3 mL IJ SOAJ injection Inject 0.3 mLs (0.3 mg total) into the muscle once. 1 Device 0  . ferrous gluconate (FERGON) 225 (27 FE) MG tablet Take 324 mg by mouth every evening.     . Glucos-Chondroit-Hyaluron-MSM (GLUCOSAMINE CHONDROITIN JOINT) TABS Take 1 tablet by mouth daily.     . Multiple Vitamins-Minerals (ONE-A-DAY WOMENS VITACRAVES PO) Take 1 tablet by mouth daily.    . vitamin C (ASCORBIC ACID) 500 MG tablet Take 500 mg by mouth daily.     . Calcium Citrate-Vitamin D (CITRACAL + D PO) Take 500 mg by mouth daily. Has 250IU of vitamin D3 and 80mg  of magnesium     No current facility-administered medications on file prior to visit.     BP (!) 190/92   Temp 98.1 F (36.7 C) (Oral)   Ht 5' 7.25" (1.708 m)   Wt 158 lb 9.6 oz (71.9 kg)   BMI 24.66 kg/m       Objective:   Physical Exam  Constitutional: She is oriented to person, place, and time. She appears well-developed and well-nourished. No distress.  Eyes: Conjunctivae and EOM are normal. Pupils are equal, round, and reactive to light. Right eye exhibits no discharge. No scleral icterus.  Cardiovascular: Normal rate, regular rhythm, normal heart sounds and intact distal pulses.  Exam reveals no gallop and no friction rub.   No murmur heard. Pulmonary/Chest: Effort normal and breath sounds normal. No respiratory distress. She has no wheezes. She has no rales. She exhibits no tenderness.  Neurological: She is alert and oriented to  person, place, and time.  Skin: Skin is warm and dry. No rash noted. She is not diaphoretic. No erythema. No pallor.  Psychiatric: She has a normal mood and affect. Her behavior is normal. Judgment and thought content normal.  Nursing note and vitals reviewed.     Assessment & Plan:  1. Hypertension, unspecified type - Unknown cause of hypertension at this point. She has had some elevated readings in the past but nothing this high. She responded well to catapres 0.1 mg. After 45 min her blood pressure was 130/80. I am going to start her on 5 of Norvasc daily. She is to follow up  with her PCP in 6-7 days. PCP aware - EKG 12-Lead- Sinus Bradycardia, Rate 55.  - POCT Urinalysis Dipstick (Automated)- Negative - CBC with Differential/Platelet - Basic metabolic panel - cloNIDine (CATAPRES) tablet 0.1 mg; Take 1 tablet (0.1 mg total) by mouth once. - amLODipine (NORVASC) 5 MG tablet; Take 1 tablet (5 mg total) by mouth daily.  Dispense: 90 tablet; Refill: 3  Dorothyann Peng, NP

## 2016-08-11 LAB — CBC WITH DIFFERENTIAL/PLATELET
Basophils Absolute: 0 10*3/uL (ref 0.0–0.1)
Basophils Relative: 0.8 % (ref 0.0–3.0)
Eosinophils Absolute: 0.2 10*3/uL (ref 0.0–0.7)
Eosinophils Relative: 3.7 % (ref 0.0–5.0)
HCT: 37.2 % (ref 36.0–46.0)
Hemoglobin: 11.8 g/dL — ABNORMAL LOW (ref 12.0–15.0)
Lymphocytes Relative: 25.4 % (ref 12.0–46.0)
Lymphs Abs: 1.1 10*3/uL (ref 0.7–4.0)
MCHC: 31.9 g/dL (ref 30.0–36.0)
MCV: 88.8 fl (ref 78.0–100.0)
Monocytes Absolute: 0.3 10*3/uL (ref 0.1–1.0)
Monocytes Relative: 8 % (ref 3.0–12.0)
Neutro Abs: 2.7 10*3/uL (ref 1.4–7.7)
Neutrophils Relative %: 62.1 % (ref 43.0–77.0)
Platelets: 204 10*3/uL (ref 150.0–400.0)
RBC: 4.19 Mil/uL (ref 3.87–5.11)
RDW: 15.6 % — ABNORMAL HIGH (ref 11.5–15.5)
WBC: 4.3 10*3/uL (ref 4.0–10.5)

## 2016-08-11 LAB — BASIC METABOLIC PANEL
BUN: 15 mg/dL (ref 6–23)
CO2: 34 mEq/L — ABNORMAL HIGH (ref 19–32)
Calcium: 10.5 mg/dL (ref 8.4–10.5)
Chloride: 101 mEq/L (ref 96–112)
Creatinine, Ser: 0.81 mg/dL (ref 0.40–1.20)
GFR: 73.36 mL/min (ref >60.00)
Glucose, Bld: 86 mg/dL (ref 70–99)
Potassium: 4.3 mEq/L (ref 3.5–5.1)
Sodium: 140 mEq/L (ref 135–145)

## 2016-08-15 NOTE — Progress Notes (Signed)
Chief Complaint  Patient presents with  . Follow-up    HPI: Belinda Harper 76 y.o. come in for Chronic disease management  See notes from last week diagnosis of very high labile hypertension. Laboratory studies were done and were normal basically. Since that time she has begun the amlodipine 5 mg a day taking at night without obvious side effects. She comes in with blood pressure readings from home. The majority are in the 120/80 or 70 range someone 40s 100 to 160s and 170s. Pulses in the 70 range. She's continued her normal activity. She has lots of stress because her son lost his job and is worried about his health care benefits and care among other things. No cardiovascular pulmonary symptoms today nor fever. No unusual bleeding. ROS: See pertinent positives and negatives per HPI. Get left lower back pain hip pain off and on   although does reg exercise  Has scoliosis and LL discrep  > what to do to maintain exercise   Past Medical History:  Diagnosis Date  . 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 ALLERGIC RHINITIS 01/03/2007  . 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 Anxiety   . ASTHMA 01/03/2007  . Blood transfusion 12 2010   hg 4 transfused 4 units  . DEPRESSION 01/03/2007   resolved  . Family history of adverse reaction to anesthesia    pt's mother as she gets older organs shut down   . FRACTURE, ANKLE, LEFT 04/10/2009  . Gastritis 2002  . GERD (gastroesophageal reflux disease)   . HEMORRHOIDS 05/19/2008  . HYPERCHOLESTEROLEMIA 05/19/2008  . Normal cardiac stress test    2014  . OSTEOARTHRITIS 01/03/2007  . OSTEOPOROSIS 01/03/2007   hx of actonbel use for at least 5 years   . Sepsis due to cellulitis ([place]) 02/05/2015  . Unspecified vitamin D deficiency 04/06/2007  . UTERINE PROLAPSE 04/06/2007    Family History  Problem Relation Age of Onset  . Hyperlipidemia Mother   . Heart disease Mother     CABG age 78  . Ovarian cancer Mother   . Uterine  cancer Mother   . Arthritis Mother   . Osteoporosis Mother   . Heart disease Father     CABG age 94  . Arthritis Father   . Melanoma Father     Social History   Social History  . Marital status: Divorced    Spouse name: N/A  . Number of children: 2  . Years of education: N/A   Social History Main Topics  . Smoking status: Never Smoker  . Smokeless tobacco: Never Used  . Alcohol use Yes     Comment: rare  . Drug use: No  . Sexual activity: Not Asked   Other Topics Concern  . None   Social History Narrative   HH of  1   To rent out room    Retired Dispensing optician   Never smoked    Pet cat allergic    Divorced   Regular exericise  walking mowing the lawnswimming   Silver sneakers        Outpatient Medications Prior to Visit  Medication Sig Dispense Refill  . amLODipine (NORVASC) 5 MG tablet Take 1 tablet (5 mg total) by mouth daily. 90 tablet 3  . Calcium Citrate-Vitamin D (CITRACAL + D PO) Take 500 mg by mouth daily. Has 250IU of vitamin D3 and 80mg  of magnesium    . docusate sodium (COLACE) 100 MG capsule Take 100  mg by mouth every evening.     [name] EPINEPHrine 0.3 mg/0.3 mL IJ SOAJ injection Inject 0.3 mLs (0.3 mg total) into the muscle once. 1 Device 0  . ferrous gluconate (FERGON) 225 (27 FE) MG tablet Take 324 mg by mouth every evening.     . Glucos-Chondroit-Hyaluron-MSM (GLUCOSAMINE CHONDROITIN JOINT) TABS Take 1 tablet by mouth daily.     . Multiple Vitamins-Minerals (ONE-A-DAY WOMENS VITACRAVES PO) Take 1 tablet by mouth daily.    . vitamin C (ASCORBIC ACID) 500 MG tablet Take 500 mg by mouth daily.      Facility-Administered Medications Prior to Visit  Medication Dose Route Frequency Provider Last Rate Last Dose  . cloNIDine (CATAPRES) tablet 0.1 mg  0.1 mg Oral Once Dorothyann Peng, NP         EXAM:  BP 124/70 (BP Location: Left Arm)   Pulse 64   Temp 98.2 F (36.8 C) (Oral)   Ht 5' 7.25" (1.708 m)   Wt 158 lb 6.4 oz (71.8 kg)   BMI 24.62 kg/m    Body mass index is 24.62 kg/m.  GENERAL: vitals reviewed and listed above, alert, oriented, appears well hydrated and in no acute distress well groomed looks well  HEENT: atraumatic, conjunctiva  clear, no obvious abnormalities on inspection of external nose and ears  MS: moves all extremities without noticeable focal  Abnormality gait nl today  PSYCH: pleasant and cooperative, no obvious depression or anxiety Lab Results  Component Value Date   WBC 4.3 08/10/2016   HGB 11.8 (L) 08/10/2016   HCT 37.2 08/10/2016   PLT 204.0 08/10/2016   GLUCOSE 86 08/10/2016   CHOL 229 (H) 09/30/2015   TRIG 66.0 09/30/2015   HDL 70.30 09/30/2015   LDLDIRECT 141.9 08/06/2012   LDLCALC 146 (H) 09/30/2015   ALT 16 09/30/2015   AST 27 09/30/2015   NA 140 08/10/2016   K 4.3 08/10/2016   CL 101 08/10/2016   CREATININE 0.81 08/10/2016   BUN 15 08/10/2016   CO2 34 (H) 08/10/2016   TSH 1.08 09/30/2015   INR 1.04 04/10/2009   BP Readings from Last 3 Encounters:  08/16/16 124/70  08/10/16 (!) 190/92  10/07/15 (!) 156/84    ASSESSMENT AND PLAN:  Discussed the following assessment and plan:  Hypertension, unspecified type  Medication management  Scoliosis, unspecified scoliosis type, unspecified spinal region  Leg length discrepancy  Pain of right" hip" - intermittent  poss back  related  disc optinos see SM to maintain  healthy exercise  hx of ankle surgery also   Stress Total visit 28mins > 50% spent counseling and coordinating care as indicated in above note and in instructions to patient .  dsic stress reduction exercise  -Patient advised to return or notify health care team  if  new concerns arise.  Patient Instructions  Stay on same medication   for now   If goes too low .   100 and below or light  headed when standing up then let us know and we can adjust dose.   Stress reduction .    Gentle exercise   .  As possible to help .   Your repeat BP is better  130/70  And 124/70    .    Consider seeing sports medicine Dr Tamala Julian  At [facility] office  Or Dr Paulla Fore at the [facility]  Office   Dr Beverlee [place]   lket [place] know if you want to go .  Call the main [place] number and  Ask about appt.   ROV in 2 months  June is fine     Standley Brooking. Zaelynn Fuchs M.D.

## 2016-08-16 ENCOUNTER — Encounter: Payer: Self-pay | Admitting: Internal Medicine

## 2016-08-16 ENCOUNTER — Ambulatory Visit (INDEPENDENT_AMBULATORY_CARE_PROVIDER_SITE_OTHER): Payer: Medicare Other | Admitting: Internal Medicine

## 2016-08-16 VITALS — BP 124/70 | HR 64 | Temp 98.2°F | Ht 67.25 in | Wt 158.4 lb

## 2016-08-16 DIAGNOSIS — F439 Reaction to severe stress, unspecified: Secondary | ICD-10-CM | POA: Diagnosis not present

## 2016-08-16 DIAGNOSIS — I10 Essential (primary) hypertension: Secondary | ICD-10-CM

## 2016-08-16 DIAGNOSIS — M419 Scoliosis, unspecified: Secondary | ICD-10-CM | POA: Diagnosis not present

## 2016-08-16 DIAGNOSIS — M2170 Unequal limb length (acquired), unspecified site: Secondary | ICD-10-CM

## 2016-08-16 DIAGNOSIS — Z79899 Other long term (current) drug therapy: Secondary | ICD-10-CM | POA: Diagnosis not present

## 2016-08-16 DIAGNOSIS — M25551 Pain in right hip: Secondary | ICD-10-CM | POA: Diagnosis not present

## 2016-08-16 NOTE — Patient Instructions (Addendum)
Stay on same medication   for now   If goes too low .   100 and below or light  headed when standing up then let us know and we can adjust dose.   Stress reduction .    Gentle exercise   .  As possible to help .   Your repeat BP is better  130/70  And 124/70   .    Consider seeing sports medicine Dr Tamala Julian  At [facility] office  Or Dr Paulla Fore at the [facility]  Office   Dr Beverlee [place]   lket [place] know if you want to go .  Call the main [facility] [place] number and  Ask about appt.   ROV in 2 months  June is fine

## 2016-09-14 LAB — HM MAMMOGRAPHY

## 2016-09-16 ENCOUNTER — Encounter: Payer: Self-pay | Admitting: Family Medicine

## 2016-10-17 NOTE — Progress Notes (Signed)
Chief Complaint  Patient presents with  . Annual Exam    HPI: Belinda Harper 76 y.o. comes in today for Preventive Medicare exam visit . And fu of medical problems S   bp lower at home  :  130 /80   Taking amlodipine without noted side effect s  anemai : well documented  Iron defi with full eval in past  Taking irone 6 per day aand tirates . No bleeding    Taking biotin to help nails and hair   Cognition doesn't think as good  No getting lost but  Sometimes not thinking about  How got somewhere  Still does self care yard work social .   Hearing may be worse left more than right but  Not keen on aids  .    Health Maintenance  Topic Date Due  . INFLUENZA VACCINE  11/23/2016  . MAMMOGRAM  09/15/2018  . TETANUS/TDAP  06/23/2019  . COLONOSCOPY  08/29/2021  . DEXA SCAN  Completed  . PNA vac Low Risk Adult  Completed   Health Maintenance Review   LIFESTYLE:  Exercise:  Summer  Active  In yard  And mows .  Hip back walking .  Tobacco/ETS: no Alcohol:  Rare  One  A month  Sugar beverages:     Some sweet .  Sleep: 6-7  Drug use: no hh of 2   And a cat    Hearing:  Getting worse. ?      Left more than right  Vision:  No limitations at present . Last eye check UTD glasses   Safety:  Has smoke detector and wears seat belts.  No firearms. No excess sun exposure. Sees dentist regularly.  Falls:  No   Advance directive :  Reviewed  Has one.  Memory:  Concerns family    Ok ? If frinds notice   Depression: No anhedonia unusual crying or depressive symptomssome bad days but no depression.   Nutrition: Eats well balanced diet; adequate calcium and vitamin D. No swallowing chewing problems.  Injury: no major injuries in the last six months.  Other healthcare providers:  Reviewed today .  Social:  hh of 2  Pet cat   Preventive parameters: up-to-date  Reviewed   ADLS:   There are no problems or need for assistance  driving, feeding, obtaining food, dressing, toileting and  bathing, managing money using phone. She is independent.    ROS: back and bnil left still bothers her with walking exercises water ok .  GEN/ HEENT: No fever, significant weight changes sweats headaches vision problems hearing changes, CV/ PULM; No chest pain shortness of breath cough, syncope,edema  change in exercise tolerance. GI /GU: No adominal pain, vomiting, change in bowel habits. No blood in the stool. Had uterus fall down a lot  And pushes back up  Bothersome and worse at times. . SKIN/HEME: ,no acute skin rashes suspicious lesions or bleeding. No lymphadenopathy, nodules, masses.  NEURO/ PSYCH:  No neurologic signs such as weakness numbness. No depression anxiety. IMM/ Allergy: No unusual infections.  Allergy .   REST of 12 system review negative except as per HPI   Past Medical History:  Diagnosis Date  . 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 ALLERGIC RHINITIS 01/03/2007  . 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 Anxiety   . ASTHMA 01/03/2007  . Blood transfusion 12 2010   hg 4 transfused 4 units  . DEPRESSION 01/03/2007   resolved  .  Family history of adverse reaction to anesthesia    pt's mother as she gets older organs shut down   . FRACTURE, ANKLE, LEFT 04/10/2009  . Gastritis 2002  . GERD (gastroesophageal reflux disease)   . HEMORRHOIDS 05/19/2008  . HYPERCHOLESTEROLEMIA 05/19/2008  . Normal cardiac stress test    2014  . OSTEOARTHRITIS 01/03/2007  . OSTEOPOROSIS 01/03/2007   hx of actonbel use for at least 5 years   . Sepsis due to cellulitis ([place]) 02/05/2015  . Unspecified vitamin D deficiency 04/06/2007  . UTERINE PROLAPSE 04/06/2007    Family History  Problem Relation Age of Onset  . Hyperlipidemia Mother   . Heart disease Mother        CABG age 42  . Ovarian cancer Mother   . Uterine cancer Mother   . Arthritis Mother   . Osteoporosis Mother   . Heart disease Father        CABG age 69  . Arthritis Father   . Melanoma Father      Social History   Social History  . Marital status: Divorced    Spouse name: N/A  . Number of children: 2  . Years of education: N/A   Social History Main Topics  . Smoking status: Never Smoker  . Smokeless tobacco: Never Used  . Alcohol use Yes     Comment: rare  . Drug use: No  . Sexual activity: Not Asked   Other Topics Concern  . None   Social History Narrative   HH of  1   To rent out room    Retired Dispensing optician   Never smoked    Pet cat allergic    Divorced   Regular exericise  walking mowing the lawnswimming   Silver sneakers        Outpatient Encounter Prescriptions as of 10/18/2016  Medication Sig  . amLODipine (NORVASC) 5 MG tablet Take 1 tablet (5 mg total) by mouth daily.  [name] BIOTIN PO Take by mouth daily.   . Calcium Citrate-Vitamin D (CITRACAL + D PO) Take 500 mg by mouth daily. Has 250IU of vitamin D3 and 80m of magnesium  . Cholecalciferol (VITAMIN D3 PO) Take by mouth.  . docusate sodium (COLACE) 100 MG capsule Take 100 mg by mouth every evening.   . ferrous gluconate (FERGON) 225 (27 FE) MG tablet Take 324 mg by mouth every evening.   . Glucos-Chondroit-Hyaluron-MSM (GLUCOSAMINE CHONDROITIN JOINT) TABS Take 1 tablet by mouth daily.   . Menaquinone-7 (VITAMIN K2 PO) Take by mouth daily.  . Multiple Vitamins-Minerals (ONE-A-DAY WOMENS VITACRAVES PO) Take 1 tablet by mouth daily.  . vitamin C (ASCORBIC ACID) 500 MG tablet Take 500 mg by mouth daily.   .Marland KitchenEPINEPHrine 0.3 mg/0.3 mL IJ SOAJ injection Inject 0.3 mLs (0.3 mg total) into the muscle once. (Patient not taking: Reported on 10/18/2016)   Facility-Administered Encounter Medications as of 10/18/2016  Medication  . cloNIDine (CATAPRES) tablet 0.1 mg    EXAM:  BP 130/60 (BP Location: Right Arm, Cuff Size: Normal)   Pulse (!) 54   Temp 97.5 F (36.4 C) (Oral)   Ht 5' 7.5" (1.715 m)   Wt 155 lb (70.3 kg)   BMI 23.92 kg/m   Body mass index is 23.92 kg/m.  Physical Exam: Vital signs  reviewed GEPP:IRJJis a well-developed well-nourished alert cooperative   who appears stated age in no acute distress.  Well groomed  HEENT: normocephalic atraumatic , Eyes: PERRL EOM's full, conjunctiva clear,  Nares: paten,t no deformity discharge or tenderness., Ears: no deformity EAC's clear TMs with normal landmarks. Mouth: clear OP, no lesions, edema.  Moist mucous membranes. Dentition in adequate repair. NECK: supple without masses, thyromegaly or bruits. CHEST/PULM:  Clear to auscultation and percussion breath sounds equal no wheeze , rales or rhonchi. No chest wall deformities or tenderness.Breast: normal by inspection . No dimpling, discharge, masses, tenderness or discharge . CV: PMI is nondisplaced, S1 S2 no gallops, murmurs, rubs. Peripheral pulses are full without delay.No JVD .  ABDOMEN: Bowel sounds normal nontender  No guard or rebound, no hepato splenomegal no CVA tenderness.   Extremtities:  No clubbing cyanosis or edema, no acute joint swelling or redness no focal atrophy scoliosis  NEURO:  Oriented x3, cranial nerves 3-12 appear to be intact, no obvious focal weakness,gait within normal limits no  SKIN: No acute rashes normal turgor, color, no bruising or petechiae. PSYCH: Oriented, good eye contact, no obvious depression anxiety, gross cognition and judgment appear normal. LN: no cervical axillary  adenopathy No noted deficits in memory, attention, and speech. Formal cognition testing not done today   Lab Results  Component Value Date   WBC 4.3 08/10/2016   HGB 11.8 (L) 08/10/2016   HCT 37.2 08/10/2016   PLT 204.0 08/10/2016   GLUCOSE 86 08/10/2016   CHOL 229 (H) 09/30/2015   TRIG 66.0 09/30/2015   HDL 70.30 09/30/2015   LDLDIRECT 141.9 08/06/2012   LDLCALC 146 (H) 09/30/2015   ALT 16 09/30/2015   AST 27 09/30/2015   NA 140 08/10/2016   K 4.3 08/10/2016   CL 101 08/10/2016   CREATININE 0.81 08/10/2016   BUN 15 08/10/2016   CO2 34 (H) 08/10/2016   TSH 1.08  09/30/2015   INR 1.04 04/10/2009    ASSESSMENT AND PLAN:  Discussed the following assessment and plan:  Visit for preventive health examination - Plan: CBC with Differential/Platelet, Basic metabolic panel, Lipid panel, TSH, Hepatic function panel  Iron deficiency anemia, unspecified iron deficiency anemia type - Plan: CBC with Differential/Platelet, Basic metabolic panel, Lipid panel, TSH, Hepatic function panel  Hypertension, unspecified type - improved today 130/60 on repeat  - Plan: CBC with Differential/Platelet, Basic metabolic panel, Lipid panel, TSH, Hepatic function panel  Hyperlipidemia, unspecified hyperlipidemia type - Plan: CBC with Differential/Platelet, Basic metabolic panel, Lipid panel, TSH, Hepatic function panel  Medication management - Plan: CBC with Differential/Platelet, Basic metabolic panel, Lipid panel, TSH, Hepatic function panel  Female genital prolapse, unspecified type - gyne consult  risk benefot of interventions - Plan: Ambulatory referral to Gynecology  Hearing difficulty, unspecified laterality - look into the achieve study at [facility]  may be helpful Will get  Gyne check about the increasing prolapse . Pat understandably wants to avoid  Infection  Risking situations  But would advise assessment about possible interventions  Patient Care Team: Panosh, Standley Brooking, MD as PCP - General Servando Salina, MD (Obstetrics and Gynecology) Inda Castle, MD (Gastroenterology) Clent Jacks, MD (Ophthalmology) Mikey Bussing, DDS (Dentistry) Druscilla Brownie, MD as Consulting Physician (Dermatology) [name], MD as Consulting Physician (Hematology and Oncology) Gaynelle Arabian, MD as Consulting Physician (Orthopedic Surgery)  Patient Instructions  Continue lifestyle intervention healthy eating and exercise . Consider achieve study .  Will notify you  of labs when available.  Loraine Leriche get gyne consult aboutthe pelvic prolapse to see if  Intervention is  appropriate.    Fu depending on labs  And fu .  Otherwise 6 mos.  Preventive Care 24 Years and Older, Female Preventive care refers to lifestyle choices and visits with your health care provider that can promote health and wellness. What does preventive care include?  A yearly physical exam. This is also called an annual well check.  Dental exams once or twice a year.  Routine eye exams. Ask your health care provider how often you should have your eyes checked.  Personal lifestyle choices, including: ? Daily care of your teeth and gums. ? Regular physical activity. ? Eating a healthy diet. ? Avoiding tobacco and drug use. ? Limiting alcohol use. ? Practicing safe sex. ? Taking low-dose aspirin every day. ? Taking vitamin and mineral supplements as recommended by your health care provider. What happens during an annual well check? The services and screenings done by your health care provider during your annual well check will depend on your age, overall health, lifestyle risk factors, and family history of disease. Counseling Your health care provider may ask you questions about your:  Alcohol use.  Tobacco use.  Drug use.  Emotional well-being.  Home and relationship well-being.  Sexual activity.  Eating habits.  History of falls.  Memory and ability to understand (cognition).  Work and work Statistician.  Reproductive health.  Screening You may have the following tests or measurements:  Height, weight, and BMI.  Blood pressure.  Lipid and cholesterol levels. These may be checked every 5 years, or more frequently if you are over 73 years old.  Skin check.  Lung cancer screening. You may have this screening every year starting at age 108 if you have a 30-pack-year history of smoking and currently smoke or have quit within the past 15 years.  Fecal occult blood test (FOBT) of the stool. You may have this test every year starting at age  61.  Flexible sigmoidoscopy or colonoscopy. You may have a sigmoidoscopy every 5 years or a colonoscopy every 10 years starting at age 34.  Hepatitis C blood test.  Hepatitis B blood test.  Sexually transmitted disease (STD) testing.  Diabetes screening. This is done by checking your blood sugar (glucose) after you have not eaten for a while (fasting). You may have this done every 1-3 years.  Bone density scan. This is done to screen for osteoporosis. You may have this done starting at age 50.  Mammogram. This may be done every 1-2 years. Talk to your health care provider about how often you should have regular mammograms.  Talk with your health care provider about your test results, treatment options, and if necessary, the need for more tests. Vaccines Your health care provider may recommend certain vaccines, such as:  Influenza vaccine. This is recommended every year.  Tetanus, diphtheria, and acellular pertussis (Tdap, Td) vaccine. You may need a Td booster every 10 years.  Varicella vaccine. You may need this if you have not been vaccinated.  Zoster vaccine. You may need this after age 30.  Measles, mumps, and rubella (MMR) vaccine. You may need at least one dose of MMR if you were born in 1957 or later. You may also need a second dose.  Pneumococcal 13-valent conjugate (PCV13) vaccine. One dose is recommended after age 57.  Pneumococcal polysaccharide (PPSV23) vaccine. One dose is recommended after age 77.  Meningococcal vaccine. You may need this if you have certain conditions.  Hepatitis A vaccine. You may need this if you have certain conditions or if you travel or work in places where you may be exposed to hepatitis  A.  Hepatitis B vaccine. You may need this if you have certain conditions or if you travel or work in places where you may be exposed to hepatitis B.  Haemophilus influenzae type b (Hib) vaccine. You may need this if you have certain conditions.  Talk to  your health care provider about which screenings and vaccines you need and how often you need them. This information is not intended to replace advice given to you by your health care provider. Make sure you discuss any questions you have with your health care provider. Document Released: 05/08/2015 Document Revised: 12/30/2015 Document Reviewed: 02/10/2015 Elsevier Interactive Patient Education  2017 [place] K. Panosh M.D.

## 2016-10-18 ENCOUNTER — Ambulatory Visit (INDEPENDENT_AMBULATORY_CARE_PROVIDER_SITE_OTHER): Payer: Medicare Other | Admitting: Internal Medicine

## 2016-10-18 ENCOUNTER — Encounter: Payer: Self-pay | Admitting: Internal Medicine

## 2016-10-18 VITALS — BP 130/60 | HR 54 | Temp 97.5°F | Ht 67.5 in | Wt 155.0 lb

## 2016-10-18 DIAGNOSIS — H9190 Unspecified hearing loss, unspecified ear: Secondary | ICD-10-CM

## 2016-10-18 DIAGNOSIS — Z0000 Encounter for general adult medical examination without abnormal findings: Secondary | ICD-10-CM | POA: Diagnosis not present

## 2016-10-18 DIAGNOSIS — I10 Essential (primary) hypertension: Secondary | ICD-10-CM | POA: Diagnosis not present

## 2016-10-18 DIAGNOSIS — D509 Iron deficiency anemia, unspecified: Secondary | ICD-10-CM | POA: Diagnosis not present

## 2016-10-18 DIAGNOSIS — Z79899 Other long term (current) drug therapy: Secondary | ICD-10-CM | POA: Diagnosis not present

## 2016-10-18 DIAGNOSIS — E785 Hyperlipidemia, unspecified: Secondary | ICD-10-CM

## 2016-10-18 DIAGNOSIS — N819 Female genital prolapse, unspecified: Secondary | ICD-10-CM

## 2016-10-18 LAB — CBC WITH DIFFERENTIAL/PLATELET
Basophils Absolute: 0 10*3/uL (ref 0.0–0.1)
Basophils Relative: 1.2 % (ref 0.0–3.0)
Eosinophils Absolute: 0.1 10*3/uL (ref 0.0–0.7)
Eosinophils Relative: 4.7 % (ref 0.0–5.0)
HCT: 35.7 % — ABNORMAL LOW (ref 36.0–46.0)
Hemoglobin: 11.5 g/dL — ABNORMAL LOW (ref 12.0–15.0)
Lymphocytes Relative: 31 % (ref 12.0–46.0)
Lymphs Abs: 1 10*3/uL (ref 0.7–4.0)
MCHC: 32.2 g/dL (ref 30.0–36.0)
MCV: 86.7 fl (ref 78.0–100.0)
Monocytes Absolute: 0.2 10*3/uL (ref 0.1–1.0)
Monocytes Relative: 8 % (ref 3.0–12.0)
Neutro Abs: 1.7 10*3/uL (ref 1.4–7.7)
Neutrophils Relative %: 55.1 % (ref 43.0–77.0)
Platelets: 181 10*3/uL (ref 150.0–400.0)
RBC: 4.11 Mil/uL (ref 3.87–5.11)
RDW: 15.5 % (ref 11.5–15.5)
WBC: 3.1 10*3/uL — ABNORMAL LOW (ref 4.0–10.5)

## 2016-10-18 LAB — LIPID PANEL
Cholesterol: 229 mg/dL — ABNORMAL HIGH (ref 0–200)
HDL: 72.4 mg/dL (ref >39.00)
LDL Cholesterol: 146 mg/dL — ABNORMAL HIGH (ref 0–99)
NonHDL: 156.79
Total CHOL/HDL Ratio: 3
Triglycerides: 56 mg/dL (ref 0.0–149.0)
VLDL: 11.2 mg/dL (ref 0.0–40.0)

## 2016-10-18 LAB — HEPATIC FUNCTION PANEL
ALT: 17 U/L (ref 0–35)
AST: 28 U/L (ref 0–37)
Albumin: 4.2 g/dL (ref 3.5–5.2)
Alkaline Phosphatase: 73 U/L (ref 39–117)
Bilirubin, Direct: 0.1 mg/dL (ref 0.0–0.3)
Total Bilirubin: 0.5 mg/dL (ref 0.2–1.2)
Total Protein: 6.9 g/dL (ref 6.0–8.3)

## 2016-10-18 LAB — BASIC METABOLIC PANEL
BUN: 22 mg/dL (ref 6–23)
CO2: 33 mEq/L — ABNORMAL HIGH (ref 19–32)
Calcium: 9.6 mg/dL (ref 8.4–10.5)
Chloride: 104 mEq/L (ref 96–112)
Creatinine, Ser: 0.72 mg/dL (ref 0.40–1.20)
GFR: 83.99 mL/min (ref >60.00)
Glucose, Bld: 89 mg/dL (ref 70–99)
Potassium: 3.9 mEq/L (ref 3.5–5.1)
Sodium: 141 mEq/L (ref 135–145)

## 2016-10-18 LAB — TSH: TSH: 0.74 u[IU]/mL (ref 0.35–4.50)

## 2016-10-18 NOTE — Patient Instructions (Addendum)
Continue lifestyle intervention healthy eating and exercise . Consider achieve study .  Will notify you  of labs when available.  Belinda Harper get gyne consult aboutthe pelvic prolapse to see if  Intervention is appropriate.    Fu depending on labs  And fu .  Otherwise 6 mos.     Preventive Care 25 Years and Older, Female Preventive care refers to lifestyle choices and visits with your health care provider that can promote health and wellness. What does preventive care include?  A yearly physical exam. This is also called an annual well check.  Dental exams once or twice a year.  Routine eye exams. Ask your health care provider how often you should have your eyes checked.  Personal lifestyle choices, including: ? Daily care of your teeth and gums. ? Regular physical activity. ? Eating a healthy diet. ? Avoiding tobacco and drug use. ? Limiting alcohol use. ? Practicing safe sex. ? Taking low-dose aspirin every day. ? Taking vitamin and mineral supplements as recommended by your health care provider. What happens during an annual well check? The services and screenings done by your health care provider during your annual well check will depend on your age, overall health, lifestyle risk factors, and family history of disease. Counseling Your health care provider may ask you questions about your:  Alcohol use.  Tobacco use.  Drug use.  Emotional well-being.  Home and relationship well-being.  Sexual activity.  Eating habits.  History of falls.  Memory and ability to understand (cognition).  Work and work Statistician.  Reproductive health.  Screening You may have the following tests or measurements:  Height, weight, and BMI.  Blood pressure.  Lipid and cholesterol levels. These may be checked every 5 years, or more frequently if you are over 51 years old.  Skin check.  Lung cancer screening. You may have this screening every year starting at age 56 if you  have a 30-pack-year history of smoking and currently smoke or have quit within the past 15 years.  Fecal occult blood test (FOBT) of the stool. You may have this test every year starting at age 47.  Flexible sigmoidoscopy or colonoscopy. You may have a sigmoidoscopy every 5 years or a colonoscopy every 10 years starting at age 43.  Hepatitis C blood test.  Hepatitis B blood test.  Sexually transmitted disease (STD) testing.  Diabetes screening. This is done by checking your blood sugar (glucose) after you have not eaten for a while (fasting). You may have this done every 1-3 years.  Bone density scan. This is done to screen for osteoporosis. You may have this done starting at age 77.  Mammogram. This may be done every 1-2 years. Talk to your health care provider about how often you should have regular mammograms.  Talk with your health care provider about your test results, treatment options, and if necessary, the need for more tests. Vaccines Your health care provider may recommend certain vaccines, such as:  Influenza vaccine. This is recommended every year.  Tetanus, diphtheria, and acellular pertussis (Tdap, Td) vaccine. You may need a Td booster every 10 years.  Varicella vaccine. You may need this if you have not been vaccinated.  Zoster vaccine. You may need this after age 70.  Measles, mumps, and rubella (MMR) vaccine. You may need at least one dose of MMR if you were born in 1957 or later. You may also need a second dose.  Pneumococcal 13-valent conjugate (PCV13) vaccine. One dose is recommended  after age 70.  Pneumococcal polysaccharide (PPSV23) vaccine. One dose is recommended after age 65.  Meningococcal vaccine. You may need this if you have certain conditions.  Hepatitis A vaccine. You may need this if you have certain conditions or if you travel or work in places where you may be exposed to hepatitis A.  Hepatitis B vaccine. You may need this if you have certain  conditions or if you travel or work in places where you may be exposed to hepatitis B.  Haemophilus influenzae type b (Hib) vaccine. You may need this if you have certain conditions.  Talk to your health care provider about which screenings and vaccines you need and how often you need them. This information is not intended to replace advice given to you by your health care provider. Make sure you discuss any questions you have with your health care provider. Document Released: 05/08/2015 Document Revised: 12/30/2015 Document Reviewed: 02/10/2015 Elsevier Interactive Patient Education  2017 [employer].

## 2016-11-03 ENCOUNTER — Encounter: Payer: Self-pay | Admitting: Obstetrics & Gynecology

## 2016-11-03 ENCOUNTER — Ambulatory Visit (INDEPENDENT_AMBULATORY_CARE_PROVIDER_SITE_OTHER): Payer: Medicare Other | Admitting: Obstetrics & Gynecology

## 2016-11-03 VITALS — BP 136/78 | Ht 67.0 in | Wt 155.0 lb

## 2016-11-03 DIAGNOSIS — K648 Other hemorrhoids: Secondary | ICD-10-CM | POA: Diagnosis not present

## 2016-11-03 DIAGNOSIS — N813 Complete uterovaginal prolapse: Secondary | ICD-10-CM

## 2016-11-03 MED ORDER — HYDROCORTISONE 2.5 % RE CREA: 1.0000 "application " | TOPICAL_CREAM | Freq: Two times a day (BID) | RECTAL | 3 refills | Status: AC

## 2016-11-03 NOTE — Progress Notes (Signed)
Belinda Harper 1940/11/07 [id number]   History:    76 y.o. G3P2A1L2  Divorced.    RP:  Very symptomatic Uterine Prolapse x 1 year  HPI:  Patient enjoys gardening, takes care of lawn, walks and has social activities.  For the past year, feels her uterus out passed the vulva by many inches (she showed me with the thumb and index a measurement corresponding to about 3 inches).  This bulging happens whenever she is standing or sitting and is very uncomfortable both causing pain and social uneasiness as she tells her friends and family all the time that she needs to go to the bathroom to pass urine, when in fact it is to push her uterus back in the vagina.  She also needs to push it back in the vagina to be able to pass urine.  No stool incontinence.  Severe hemorrhoids, which she also needs to push back in, but says that those are not painful.  Past medical history,surgical history, family history and social history were all reviewed and documented in the EPIC chart.  Gynecologic History No LMP recorded. Patient is postmenopausal. Contraception: post menopausal status Last Pap: 2012. Result was: normal Last mammogram: 08/2016.  Result was: negative  Obstetric History OB History  Gravida Para Term Preterm AB Living  3 2     1 2   SAB TAB Ectopic Multiple Live Births  1            # Outcome Date GA Lbr Len/2nd Weight Sex Delivery Anes PTL Lv  3 SAB           2 Para           1 Para                ROS: A ROS was performed and pertinent positives and negatives are included in the history.  GENERAL: No fevers or chills. HEENT: No change in vision, no earache, sore throat or sinus congestion. NECK: No pain or stiffness. CARDIOVASCULAR: No chest pain or pressure. No palpitations. PULMONARY: No shortness of breath, cough or wheeze. GASTROINTESTINAL: No abdominal pain, nausea, vomiting or diarrhea, melena or bright red blood per rectum. GENITOURINARY: No urinary frequency, urgency, hesitancy or  dysuria. MUSCULOSKELETAL: No joint or muscle pain, no back pain, no recent trauma. DERMATOLOGIC: No rash, no itching, no lesions. ENDOCRINE: No polyuria, polydipsia, no heat or cold intolerance. No recent change in weight. HEMATOLOGICAL: No anemia or easy bruising or bleeding. NEUROLOGIC: No headache, seizures, numbness, tingling or weakness. PSYCHIATRIC: No depression, no loss of interest in normal activity or change in sleep pattern.     Exam:  BP 136/78   Ht 5\' 7"  (1.702 m)   Wt 155 lb (70.3 kg)   BMI 24.28 kg/m   Body mass index is 24.28 kg/m.  General appearance : Well developed well nourished female. No acute distress  Abdomen: no palpable masses or tenderness, no rebound or guarding  Extremities: no edema or skin discoloration  Pelvic: Vulva normal  Bartholin, Urethra, Skene Glands: Within normal limits             Vagina: No gross lesions or discharge  Cervix: No gross lesions or discharge.  No erythema, no erosion, no ulcer.  Uterus:  Complete Uterine prolapse with eversion.  Uterus normal size, shape and consistency, non-tender.  Adnexa  Without masses or tenderness  Anus:  Very inflamed prolapsed internal hemorrhoids     Assessment/Plan:  76 y.o. female  1. 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  Pessary vs surgical management discussed with risks/benefits of each option.  Decision to attempt pessary first.  Milex Ring with support #6 fitted.  Patient very comfortable with it and pessary stayed in good vaginal position with valsalva and squatting.  No Urinary incontinence experienced while the pessary was in place and was happy that her urine flow was better.  Will order the pessary and have her come back for insertion.  Will complete the instructions on pessary insertion and removal for pessary cleaning at that visit.  2. Prolapsed internal hemorrhoids Hydrocortisone 2.5%  cream prescribed.  Recommend reevaluation by General Surgery.  Counseling on above issues >50% x 30 minutes.  [name] MD, 10:21 AM 11/03/2016

## 2016-11-03 NOTE — Patient Instructions (Signed)
1. 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  Pessary vs surgical management discussed with risks/benefits of each option.  Decision to attempt pessary first.  Milex Ring with support #6 fitted.  Patient very comfortable with it and pessary stayed in good vaginal position with valsalva and squatting.  No Urinary incontinence experienced while the pessary was in place and was happy that her urine flow was better.  Will order the pessary and have her come back for insertion.  Will complete the instructions on pessary insertion and removal for pessary cleaning at that visit.  2. Prolapsed internal hemorrhoids Hydrocortisone 2.5% cream prescribed.  Recommend reevaluation by General Surgery.  Diona, it was a pleasure to meet you today!  I will see back soon to insert the pessary.

## 2016-11-14 ENCOUNTER — Encounter: Payer: Self-pay | Admitting: Obstetrics & Gynecology

## 2016-11-14 ENCOUNTER — Ambulatory Visit (INDEPENDENT_AMBULATORY_CARE_PROVIDER_SITE_OTHER): Payer: Medicare Other | Admitting: Obstetrics & Gynecology

## 2016-11-14 VITALS — BP 132/78

## 2016-11-14 DIAGNOSIS — N814 Uterovaginal prolapse, unspecified: Secondary | ICD-10-CM

## 2016-11-14 NOTE — Patient Instructions (Signed)
1. Complete Uterine prolapse Easy insertion of Milex ring with support #6.  Will f/u in 3 weeks to make sure that the fit is good, without vaginal mucosa irritation/erosion and to teach insertion and removal by patient.  Good to see you today Shunna!

## 2016-11-14 NOTE — Progress Notes (Signed)
     Belinda Harper 1940/05/05 [id number]        76 y.o.  [id number]  Divorced.  RP:  Pessary insertion for symptomatic Complete Uterine Prolapse  HPI:  No change x last visit.  Patient's pessary ordered and received.  Past medical history,surgical history, problem list, medications, allergies, family history and social history were all reviewed and documented in the EPIC chart.  Directed ROS with pertinent positives and negatives documented in the history of present illness/assessment and plan.  Exam:  Vitals:   11/14/16 1454  BP: 132/78   General appearance:  Normal  Easy insertion of pessary.  Good fit.  Assessment/Plan:  76 y.o. [id number]   1. Complete Uterine prolapse Easy insertion of Milex ring with support #6.  Will f/u in 3 weeks to make sure that the fit is good, without vaginal mucosa irritation/erosion and to teach insertion and removal by patient.  [name] MD, 3:07 PM 11/14/2016

## 2016-12-07 ENCOUNTER — Ambulatory Visit: Payer: Medicare Other | Admitting: Obstetrics & Gynecology

## 2016-12-15 ENCOUNTER — Encounter: Payer: Self-pay | Admitting: Obstetrics & Gynecology

## 2016-12-15 ENCOUNTER — Ambulatory Visit (INDEPENDENT_AMBULATORY_CARE_PROVIDER_SITE_OTHER): Payer: Medicare Other | Admitting: Obstetrics & Gynecology

## 2016-12-15 VITALS — BP 130/70

## 2016-12-15 DIAGNOSIS — N814 Uterovaginal prolapse, unspecified: Secondary | ICD-10-CM

## 2016-12-15 DIAGNOSIS — Z4689 Encounter for fitting and adjustment of other specified devices: Secondary | ICD-10-CM | POA: Diagnosis not present

## 2016-12-15 DIAGNOSIS — N393 Stress incontinence (female) (male): Secondary | ICD-10-CM | POA: Diagnosis not present

## 2016-12-15 NOTE — Patient Instructions (Signed)
1. Uterine prolapse Satisfied with Pessary.  2. Pessary maintenance Good fit, not falling out, no erosion, no ulceration.  Normal vaginal secretions.  Very satisfied with the support.  Attempted removing and reinserting the pessary herself with success, but painful and some spotting from overstretching the skin.  Decision made to f/u in 3 months with me for pessary maintenance.  3. SUI (stress urinary incontinence, female) Using a thin pad.  Recommend Kegel Exercises.  Instructions given.  If no improvement, will consider PT.  Bryley, good to see you today!   Kegel Exercises Kegel exercises help strengthen the muscles that support the rectum, vagina, small intestine, bladder, and uterus. Doing Kegel exercises can help:  Improve bladder and bowel control.  Improve sexual response.  Reduce problems and discomfort during pregnancy.  Kegel exercises involve squeezing your pelvic floor muscles, which are the same muscles you squeeze when you try to stop the flow of urine. The exercises can be done while sitting, standing, or lying down, but it is best to vary your position. Phase 1 exercises 1. Squeeze your pelvic floor muscles tight. You should feel a tight lift in your rectal area. If you are a female, you should also feel a tightness in your vaginal area. Keep your stomach, buttocks, and legs relaxed. 2. Hold the muscles tight for up to 10 seconds. 3. Relax your muscles. Repeat this exercise 50 times a day or as many times as told by your health care provider. Continue to do this exercise for at least 4-6 weeks or for as long as told by your health care provider. This information is not intended to replace advice given to you by your health care provider. Make sure you discuss any questions you have with your health care provider. Document Released: 03/28/2012 Document Revised: 12/05/2015 Document Reviewed: 03/01/2015 Elsevier Interactive Patient Education  [employer].

## 2016-12-15 NOTE — Progress Notes (Signed)
    Belinda Harper April 14, 1941 [id number]        76 y.o.  [id number]   RP:  Pessary check  HPI:  Very satisfied of support with pessary.  No vaginal d/c.  No pelvic or vaginal pain.  Mild but frequent SUI since pessary in place.  No Sx of UTI.  Past medical history,surgical history, problem list, medications, allergies, family history and social history were all reviewed and documented in the EPIC chart.  Directed ROS with pertinent positives and negatives documented in the history of present illness/assessment and plan.  Exam:  Vitals:   12/15/16 0759  BP: 130/70   General appearance:  Normal  Gyn exam:  Vulva normal                     Pessary easily removed and washed.                       Speculum:  Normal cervix and vagina.  No erosion/ulcer.  Normal vaginal secretions                     Pessary put back in place  Assessment/Plan:  76 y.o. [id number]  1. Uterine prolapse Satisfied with Pessary.  2. Pessary maintenance Good fit, not falling out, no erosion, no ulceration.  Normal vaginal secretions.  Very satisfied with the support.  Attempted removing and reinserting the pessary herself with success, but painful and some spotting from overstretching the skin.  Decision made to f/u in 3 months with me for pessary maintenance.  3. SUI (stress urinary incontinence, female) Using a thin pad.  Recommend Kegel Exercises.  Instructions given.  If no improvement, will consider PT.  Counseling on above issues >50% x 15 minutes.  [name] MD, 8:11 AM 12/15/2016

## 2017-01-13 ENCOUNTER — Encounter: Payer: Self-pay | Admitting: Internal Medicine

## 2017-03-22 ENCOUNTER — Ambulatory Visit (INDEPENDENT_AMBULATORY_CARE_PROVIDER_SITE_OTHER): Payer: Medicare Other | Admitting: Obstetrics & Gynecology

## 2017-03-22 ENCOUNTER — Encounter: Payer: Self-pay | Admitting: Obstetrics & Gynecology

## 2017-03-22 VITALS — BP 128/80

## 2017-03-22 DIAGNOSIS — Z4689 Encounter for fitting and adjustment of other specified devices: Secondary | ICD-10-CM

## 2017-03-22 NOTE — Progress Notes (Signed)
    TAVARIA MACKINS 05-11-1940 [id number]        76 y.o.  [id number]  RP:  Pessary maintenance  HPI: Very satisfied of support with Milex Ring with support #6 pessary.  No vaginal d/c.  No PMB.  No pelvic or vaginal pain.  Mild but frequent SUI since pessary in place.  No Sx of UTI.  BMs wnl.    Past medical history,surgical history, problem list, medications, allergies, family history and social history were all reviewed and documented in the EPIC chart.  Directed ROS with pertinent positives and negatives documented in the history of present illness/assessment and plan.  Exam:  Vitals:   03/22/17 1543  BP: 128/80   General appearance:  Normal  Gyn exam:  Vulva normal                     Pessary easily removed and washed.                       Speculum:  Normal cervix and vagina.  No erosion/ulcer.  Normal vaginal secretions                     Pessary put back in place easily.    Assessment/Plan:  76 y.o. [id number]   1. Encounter for pessary maintenance Good fit, not falling out, no erosion, no ulceration.  Normal vaginal secretions.  Very satisfied with the support.  Patient plans to attempt removal and reinsertion of the pessary herself just before the next visit in 3 months.  If successful, will space or even stop the pessary maintenance visits and wash the pessary herself.  If not successful will continue maintenance of pessary visits every 3-4 months.  If not coming anymore for Pessary maintenance, will do Annual Gyn visits every year.  Counseling on above issues more than 50% for 15 minutes.  [name] MD, 3:45 PM 03/22/2017

## 2017-03-25 ENCOUNTER — Encounter: Payer: Self-pay | Admitting: Obstetrics & Gynecology

## 2017-03-25 NOTE — Patient Instructions (Signed)
1. Encounter for pessary maintenance Good fit, not falling out, no erosion, no ulceration.  Normal vaginal secretions. Very satisfied with the support.  Patient plans to attempt removal and reinsertion of the pessary herself just before the next visit in 3 months.  If successful, will space or even stop the pessary maintenance visits and wash the pessary herself.  If not successful will continue maintenance of pessary visits every 3-4 months.  If not coming anymore for Pessary maintenance, will do Annual Gyn visits every year.  Belinda Harper, it was a pleasure seeing you today!

## 2017-04-24 NOTE — Progress Notes (Signed)
Chief Complaint  Patient presents with  . Follow-up    pt c/o increased hip pain, hurting more with ambulation for the last 6 months - worsening. Pt notes that pain started with weather change.      HPI: Belinda Harper 76 y.o. come in for Chronic disease management   Anemia seems ok no unusual bleeding  Some ocass hemorrhoids   Left  Low back and radiating  To lateral  Leg.   Takes  meds as needed.  owrse with long walk .  Rest help.   Sores.    Worse this year.  And now taking  Heating.  And tylenol  Wonders if scolisosi and ll discrep could be  Worsening   bp has been ok no se noted of meds  memory still an issues at times  Father passed in 82  Dementia  soetimes cant remember  Names .   Didn't qualify for hearing cognition study  ROS: See pertinent positives and negatives per HPI.  Past Medical History:  Diagnosis Date  . 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 ALLERGIC RHINITIS 01/03/2007  . 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 Anxiety   . ASTHMA 01/03/2007  . Blood transfusion 12 2010   hg 4 transfused 4 units  . DEPRESSION 01/03/2007   resolved  . Family history of adverse reaction to anesthesia    pt's mother as she gets older organs shut down   . FRACTURE, ANKLE, LEFT 04/10/2009  . Gastritis 2002  . GERD (gastroesophageal reflux disease)   . HEMORRHOIDS 05/19/2008  . HYPERCHOLESTEROLEMIA 05/19/2008  . Normal cardiac stress test    2014  . OSTEOARTHRITIS 01/03/2007  . OSTEOPOROSIS 01/03/2007   hx of actonbel use for at least 5 years   . Sepsis due to cellulitis ([place]) 02/05/2015  . Unspecified vitamin D deficiency 04/06/2007  . UTERINE PROLAPSE 04/06/2007    Family History  Problem Relation Age of Onset  . Hyperlipidemia Mother   . Heart disease Mother        CABG age 50  . Ovarian cancer Mother   . Uterine cancer Mother   . Arthritis Mother   . Osteoporosis Mother   . Heart disease Father        CABG age 42  . Arthritis Father   .  Melanoma Father     Social History   Socioeconomic History  . Marital status: Divorced    Spouse name: None  . Number of children: 2  . Years of education: None  . Highest education level: None  Social Needs  . Financial resource strain: None  . Food insecurity - worry: None  . Food insecurity - inability: None  . Transportation needs - medical: None  . Transportation needs - non-medical: None  Occupational History  . None  Tobacco Use  . Smoking status: Never Smoker  . Smokeless tobacco: Never Used  Substance and Sexual Activity  . Alcohol use: Yes    Comment: rare  . Drug use: No  . Sexual activity: Not Currently    Comment: 1st intercourse- 18, partners- 1   Other Topics Concern  . None  Social History Narrative   HH of  1   To rent out room    Retired Dispensing optician   Never smoked    Pet cat allergic    Divorced   Regular exericise  walking mowing the lawnswimming   Silver sneakers     Outpatient Medications Prior  to Visit  Medication Sig Dispense Refill  . amLODipine (NORVASC) 5 MG tablet Take 1 tablet (5 mg total) by mouth daily. 90 tablet 3  . BIOTIN PO Take by mouth daily.     . Calcium Citrate-Vitamin D (CITRACAL + D PO) Take 500 mg by mouth daily. Has 250IU of vitamin D3 and 80mg  of magnesium    . Cholecalciferol (VITAMIN D3 PO) Take by mouth.    . docusate sodium (COLACE) 100 MG capsule Take 100 mg by mouth every evening.     . ferrous gluconate (FERGON) 225 (27 FE) MG tablet Take 324 mg by mouth every evening.     . Glucos-Chondroit-Hyaluron-MSM (GLUCOSAMINE CHONDROITIN JOINT) TABS Take 2 tablets by mouth daily.     . Menaquinone-7 (VITAMIN K2 PO) Take by mouth daily.    . Multiple Vitamins-Minerals (ONE-A-DAY WOMENS VITACRAVES PO) Take 1 tablet by mouth daily.    . vitamin C (ASCORBIC ACID) 500 MG tablet Take 500 mg by mouth daily.      Facility-Administered Medications Prior to Visit  Medication Dose Route Frequency Provider Last Rate Last Dose  .  cloNIDine (CATAPRES) tablet 0.1 mg  0.1 mg Oral Once Nafziger, Tommi Rumps, NP         EXAM:  BP 110/62 (BP Location: Right Arm, Patient Position: Sitting, Cuff Size: Normal)   Pulse 66   Temp 97.9 F (36.6 C) (Oral)   Wt 159 lb 3.2 oz (72.2 kg)   BMI 24.93 kg/m   Body mass index is 24.93 kg/m.  GENERAL: vitals reviewed and listed above, alert, oriented, appears well hydrated and in no acute distress HEENT: atraumatic, conjunctiva  clear, no obvious abnormalities on inspection of external nose and ears ONECK: no obvious masses on inspection palpation  LUNGS: clear to auscultation bilaterally, no wheezes, rales or rhonchi,  CV: HRRR, no clubbing cyanosis or  peripheral edema nl cap refill  MS: moves all extremities without noticeable focal  Abnormality   Mild  irreg of gait   PSYCH: pleasant and cooperative, no obvious depression or anxiety Lab Results  Component Value Date   WBC 3.1 (L) 10/18/2016   HGB 11.8 (A) 04/26/2017   HCT 35.7 (L) 10/18/2016   PLT 181.0 10/18/2016   GLUCOSE 89 10/18/2016   CHOL 229 (H) 10/18/2016   TRIG 56.0 10/18/2016   HDL 72.40 10/18/2016   LDLDIRECT 141.9 08/06/2012   LDLCALC 146 (H) 10/18/2016   ALT 17 10/18/2016   AST 28 10/18/2016   NA 141 10/18/2016   K 3.9 10/18/2016   CL 104 10/18/2016   CREATININE 0.72 10/18/2016   BUN 22 10/18/2016   CO2 33 (H) 10/18/2016   TSH 0.74 10/18/2016   INR 1.04 04/10/2009   BP Readings from Last 3 Encounters:  04/26/17 110/62  03/22/17 128/80  12/15/16 130/70   Wt Readings from Last 3 Encounters:  04/26/17 159 lb 3.2 oz (72.2 kg)  11/03/16 155 lb (70.3 kg)  10/18/16 155 lb (70.3 kg)     ASSESSMENT AND PLAN:  Discussed the following assessment and plan:  Iron deficiency anemia, unspecified iron deficiency anemia type - stable  follow - Plan: POCT hemoglobin  Hypertension, unspecified type  Hyperlipidemia, unspecified hyperlipidemia type  Medication management  Leg length  discrepancy  Scoliosis, unspecified scoliosis type, unspecified spinal region  Left hip pain - seems more back  ? vs hip .   Suspect back to in hip possibly related to her scoliosis LL discrepancy. In regard to memory does not  appear to have some severe problem however will follow his had B12 and thyroid checked in the past will follow up.  Reassess at her next visit.  Nonfocal exam today and no alarm findings. Advise get  Full audiology eval   Etc  6 mos cpx  Labs Total visit 32mins > 50% spent counseling and coordinating care as indicated in above note and in instructions to patient .   -Patient advised to return or notify health care team  if  new concerns arise.  Patient Instructions  consider seeing    sports medicine  For assessment . [facility]  Or  Dr Michel [place]  .    Who saw you in 1610   Certainly leg length problem and scoliosis can add to the problem . Ok to take aceomenophen if needed .  Will follow up at your cpx and  [facility]   Memory  Concerns at that time .  Anemia is stable  Make sure  Hearing is  Ok .           Standley Brooking. Velvet Moomaw M.D.

## 2017-04-26 ENCOUNTER — Ambulatory Visit (INDEPENDENT_AMBULATORY_CARE_PROVIDER_SITE_OTHER): Payer: Medicare Other | Admitting: Internal Medicine

## 2017-04-26 ENCOUNTER — Encounter: Payer: Self-pay | Admitting: Internal Medicine

## 2017-04-26 VITALS — BP 110/62 | HR 66 | Temp 97.9°F | Wt 159.2 lb

## 2017-04-26 DIAGNOSIS — E785 Hyperlipidemia, unspecified: Secondary | ICD-10-CM | POA: Diagnosis not present

## 2017-04-26 DIAGNOSIS — M2170 Unequal limb length (acquired), unspecified site: Secondary | ICD-10-CM | POA: Diagnosis not present

## 2017-04-26 DIAGNOSIS — M419 Scoliosis, unspecified: Secondary | ICD-10-CM | POA: Diagnosis not present

## 2017-04-26 DIAGNOSIS — Z79899 Other long term (current) drug therapy: Secondary | ICD-10-CM | POA: Diagnosis not present

## 2017-04-26 DIAGNOSIS — D509 Iron deficiency anemia, unspecified: Secondary | ICD-10-CM

## 2017-04-26 DIAGNOSIS — I10 Essential (primary) hypertension: Secondary | ICD-10-CM | POA: Diagnosis not present

## 2017-04-26 DIAGNOSIS — M25552 Pain in left hip: Secondary | ICD-10-CM

## 2017-04-26 LAB — POCT HEMOGLOBIN: Hemoglobin: 11.8 g/dL — AB (ref 12.2–16.2)

## 2017-04-26 NOTE — Patient Instructions (Addendum)
consider seeing    sports medicine  For assessment . [facility]  Or  Dr Michel Harrow fields  .    Who saw you in [id number]   Certainly leg length problem and scoliosis can add to the problem . Ok to take aceomenophen if needed .  Will follow up at your cpx and  [facility]   Memory  Concerns at that time .  Anemia is stable  Make sure  Hearing is  Ok .

## 2017-06-21 ENCOUNTER — Encounter: Payer: Self-pay | Admitting: Obstetrics & Gynecology

## 2017-06-21 ENCOUNTER — Ambulatory Visit (INDEPENDENT_AMBULATORY_CARE_PROVIDER_SITE_OTHER): Payer: Medicare Other | Admitting: Obstetrics & Gynecology

## 2017-06-21 VITALS — BP 140/70

## 2017-06-21 DIAGNOSIS — K644 Residual hemorrhoidal skin tags: Secondary | ICD-10-CM

## 2017-06-21 DIAGNOSIS — N9089 Other specified noninflammatory disorders of vulva and perineum: Secondary | ICD-10-CM

## 2017-06-21 DIAGNOSIS — Z4689 Encounter for fitting and adjustment of other specified devices: Secondary | ICD-10-CM

## 2017-06-21 NOTE — Patient Instructions (Signed)
1. Encounter for pessary maintenance Doing very well with Milex ring pessary #6 with support.  We will follow-up in 4 months for pessary maintenance.  2. Vulvar irritation Mild as symptomatic vulvar irritation.  Warm soaking as needed.  May use hydrocortisone cream 1% occasionally if uncomfortable.  3. External hemorrhoids Recommend using Preparation H when pushing the hemorrhoids back in to decrease inflammation and reduce the occurrence of bleeding.  Follow-up with gastroenterologist if no improvement.  Belinda Harper, good seeing you today!

## 2017-06-21 NOTE — Progress Notes (Signed)
    STERLING UCCI [birth year] [id number]        77 y.o.  [id number]   RP: Pessary maintenance  HPI: Doing well with Milex ring pessary #6 with support.  No abnormal vaginal discharge.  No postmenopausal bleeding.  No pelvic or vaginal pain.  No significant stress urinary incontinence.  Doing Kegel's every morning.  External hemorrhoids occasionally bleeding, but no discomfort, itching or pain.   OB History  Gravida Para Term Preterm AB Living  3 2     1 2   SAB TAB Ectopic Multiple Live Births  1            # Outcome Date GA Lbr Len/2nd Weight Sex Delivery Anes PTL Lv  3 SAB           2 Para           1 Para               Past medical history,surgical history, problem list, medications, allergies, family history and social history were all reviewed and documented in the EPIC chart.   Directed ROS with pertinent positives and negatives documented in the history of present illness/assessment and plan.  Exam:  Vitals:   06/21/17 1422  BP: 140/70   General appearance:  Normal  Gynecologic exam: Posterior vulva and perianal area mildly erythematous.  Pessary removed and cleaned.  Vaginal exam normal, mucosa intact with no ulceration.  Pessary easily put back in place.   Assessment/Plan:  77 y.o. [id number]   1. Encounter for pessary maintenance Doing very well with Milex ring pessary #6 with support.  We will follow-up in 4 months for pessary maintenance.  2. Vulvar irritation Mild as symptomatic vulvar irritation.  Warm soaking as needed.  May use hydrocortisone cream 1% occasionally if uncomfortable.  3. External hemorrhoids Recommend using Preparation H when pushing the hemorrhoids back in to decrease inflammation and reduce the occurrence of bleeding.  Follow-up with gastroenterologist if no improvement.  Counseling on above issues more than 50% for 15 minutes.  [name] MD, 2:37 PM 06/21/2017

## 2017-09-04 ENCOUNTER — Telehealth: Payer: Self-pay | Admitting: *Deleted

## 2017-09-04 DIAGNOSIS — Z1239 Encounter for other screening for malignant neoplasm of breast: Secondary | ICD-10-CM

## 2017-09-04 DIAGNOSIS — Z780 Asymptomatic menopausal state: Secondary | ICD-10-CM

## 2017-09-04 NOTE — Telephone Encounter (Signed)
Copied from [place] [phone]. Topic: General - Other >> Sep 04, 2017 12:19 PM Carolyn Stare wrote:  Pt call to see this is the year she gets her bone density and mammogram   and need an order

## 2017-09-06 NOTE — Telephone Encounter (Signed)
Per records: Mammogram completed 09/14/16, recommended to follow up yearly Dexa completed 08/2015, recommended to follow up in 2 years.  Both are due this month.   Will send to Dr Regis Bill to give okay.

## 2017-09-07 NOTE — Telephone Encounter (Signed)
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

## 2017-09-13 NOTE — Telephone Encounter (Signed)
Orders placed. Will fax to [facility].

## 2017-09-14 LAB — HM DEXA SCAN

## 2017-09-14 LAB — HM MAMMOGRAPHY

## 2017-09-15 NOTE — Telephone Encounter (Signed)
Rx/forms faxed. Fax confirmation received.  

## 2017-09-27 ENCOUNTER — Encounter: Payer: Self-pay | Admitting: Internal Medicine

## 2017-10-18 ENCOUNTER — Ambulatory Visit: Payer: Medicare Other | Admitting: Obstetrics & Gynecology

## 2017-10-19 ENCOUNTER — Encounter: Payer: Self-pay | Admitting: Obstetrics & Gynecology

## 2017-10-19 ENCOUNTER — Ambulatory Visit (INDEPENDENT_AMBULATORY_CARE_PROVIDER_SITE_OTHER): Payer: Medicare Other | Admitting: Obstetrics & Gynecology

## 2017-10-19 VITALS — BP 128/70

## 2017-10-19 DIAGNOSIS — Z4689 Encounter for fitting and adjustment of other specified devices: Secondary | ICD-10-CM

## 2017-10-19 NOTE — Progress Notes (Signed)
Chief Complaint  Patient presents with  . Follow-up    Pt present for Yearly follow-up. Pt also claims to have fell and hit her head on the concrete. Pt did not get any medical help.    . Memory Loss    Pt also present for memory issues that she thinks she may have. However Manuela Schwartz gave her a memory test and stated that the pt "blew it out of the water"     HPI: Belinda Harper 77 y.o. comes in today for Preventive Medicare exam/ wellness visit .Since last visit.   Anemia: taking iron  No bleeding x ocass hemorrhoids as in past  Fell on back of heal 8 days ago lump on head called  And decided not to take appt  No lic  Has a lun p nocc sx . Has Lift in shoe.  M,akes her off balacne at times  No new neuro sx .   Bp has been good at home 130 range     Had gyne check  pessary  Health Maintenance  Topic Date Due  . INFLUENZA VACCINE  11/23/2017  . TETANUS/TDAP  06/23/2019  . COLONOSCOPY  08/29/2021  . DEXA SCAN  Completed  . PNA vac Low Risk Adult  Completed   Health Maintenance Review LIFESTYLE:  Exercise:   Yard evey day  abd  Walk 2 x per week .  Tobacco/ETS: no Alcohol:   Very little  Sugar beverages: some  g2  Sleep:  6-7 hours  Drug use: no HH:  2  Cat    Hearing:   Ok   Vision:  No limitations at present . Last eye check UTD  Safety:  Has smoke detector and wears seat belts.  No firearms. No excess sun exposure. Sees dentist regularly.  Falls: y  ADLS:   There are no problems or need for assistance  driving, feeding, obtaining food, dressing, toileting and bathing, managing money using phone. She is independent.   ROS:  GEN/ HEENT: No fever, significant weight changes sweats headaches vision problems hearing changes, CV/ PULM; No chest pain shortness of breath cough, syncope,edema  change in exercise tolerance. GI /GU: No adominal pain, vomiting, change in bowel habits. No blood in the stool. No significant GU symptoms. SKIN/HEME: ,no acute skin rashes  suspicious lesions or bleeding. No lymphadenopathy, nodules, masses.  NEURO/ PSYCH:  No neurologic signs such as weakness numbness. No depression anxiety. IMM/ Allergy: No unusual infections.  Allergy .   REST of 12 system review negative except as per HPI   Past Medical History:  Diagnosis Date  . 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 ALLERGIC RHINITIS 01/03/2007  . 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 Anxiety   . ASTHMA 01/03/2007  . Blood transfusion 12 2010   hg 4 transfused 4 units  . DEPRESSION 01/03/2007   resolved  . Family history of adverse reaction to anesthesia    pt's mother as she gets older organs shut down   . FRACTURE, ANKLE, LEFT 04/10/2009  . Gastritis 2002  . GERD (gastroesophageal reflux disease)   . HEMORRHOIDS 05/19/2008  . HYPERCHOLESTEROLEMIA 05/19/2008  . Normal cardiac stress test    2014  . OSTEOARTHRITIS 01/03/2007  . OSTEOPOROSIS 01/03/2007   hx of actonbel use for at least 5 years   . Sepsis due to cellulitis ([place]) 02/05/2015  . Unspecified vitamin D deficiency 04/06/2007  . UTERINE PROLAPSE 04/06/2007    Family History  Problem Relation  Age of Onset  . Hyperlipidemia Mother   . Heart disease Mother        CABG age 81  . Ovarian cancer Mother   . Uterine cancer Mother   . Arthritis Mother   . Osteoporosis Mother   . Heart disease Father        CABG age 50  . Arthritis Father   . Melanoma Father     Social History   Socioeconomic History  . Marital status: Divorced    Spouse name: Not on file  . Number of children: 2  . Years of education: Not on file  . Highest education level: Not on file  Occupational History  . Not on file  Social Needs  . Financial resource strain: Not on file  . Food insecurity:    Worry: Not on file    Inability: Not on file  . Transportation needs:    Medical: Not on file    Non-medical: Not on file  Tobacco Use  . Smoking status: Never Smoker  . Smokeless tobacco: Never Used    Substance and Sexual Activity  . Alcohol use: Yes    Comment: rare  . Drug use: No  . Sexual activity: Not Currently    Comment: 1st intercourse- 18, partners- 1   Lifestyle  . Physical activity:    Days per week: Not on file    Minutes per session: Not on file  . Stress: Not on file  Relationships  . Social connections:    Talks on phone: Not on file    Gets together: Not on file    Attends religious service: Not on file    Active member of club or organization: Not on file    Attends meetings of clubs or organizations: Not on file    Relationship status: Not on file  Other Topics Concern  . Not on file  Social History Narrative   HH of  1   To rent out room    Retired Dispensing optician   Never smoked    Pet cat allergic    Divorced   Regular exericise  walking mowing the lawnswimming   Silver sneakers     Outpatient Encounter Medications as of 10/20/2017  Medication Sig  . amLODipine (NORVASC) 5 MG tablet Take 1 tablet (5 mg total) by mouth daily.  [name] BIOTIN PO Take by mouth daily.   . Calcium Citrate-Vitamin D (CITRACAL + D PO) Take 500 mg by mouth daily. Has 250IU of vitamin D3 and 57m of magnesium  . Cholecalciferol (VITAMIN D3 PO) Take by mouth.  . docusate sodium (COLACE) 100 MG capsule Take 100 mg by mouth every evening.   . ferrous gluconate (FERGON) 225 (27 FE) MG tablet Take 324 mg by mouth every evening.   . Glucos-Chondroit-Hyaluron-MSM (GLUCOSAMINE CHONDROITIN JOINT) TABS Take 2 tablets by mouth daily.   . Magnesium 100 MG CAPS Take by mouth.  . Menaquinone-7 (VITAMIN K2 PO) Take by mouth daily.  . Multiple Vitamins-Minerals (ONE-A-DAY WOMENS VITACRAVES PO) Take 1 tablet by mouth daily.  . vitamin C (ASCORBIC ACID) 500 MG tablet Take 500 mg by mouth daily.    Facility-Administered Encounter Medications as of 10/20/2017  Medication  . cloNIDine (CATAPRES) tablet 0.1 mg    EXAM:  BP (!) 150/80 (BP Location: Right Arm, Patient Position: Sitting, Cuff Size:  Large)   Temp 97.7 F (36.5 C) (Oral)   Ht _0  (1.702 m)   Wt 148 lb 8 oz (  67.4 kg)   BMI 23.26 kg/m   Body mass index is 23.26 kg/m.  Physical Exam: Vital signs reviewed GQQ:[id number] is a well-developed well-nourished alert cooperative   who appears stated age in no acute distress.  HEENT: normocephalic 2 cm soft bump posterior head no bleeding  Mild tender no deformityc , Eyes: PERRL EOM's full, conjunctiva clear, Nares: paten,t no deformity discharge or tenderness., Ears: no deformity EAC's clear TMs with normal landmarks. Mouth: clear OP, no lesions, edema.  Moist mucous membranes. Dentition in adequate repair. NECK: supple without masses, thyromegaly or bruits. CHEST/PULM:  Clear to auscultation and percussion breath sounds equal no wheeze , rales or rhonchi. No chest wall deformities or tenderness. CV: PMI is nondisplaced, S1 S2 no gallops, murmurs, rubs. Peripheral pulses are full without delay.No JVD . Breast: normal by inspection . No dimpling, discharge, masses, tenderness or discharge . ABDOMEN: Bowel sounds normal nontender  No guard or rebound, no hepato splenomegal no CVA tenderness.   Extremtities:  No clubbing cyanosis or edema, no acute joint swelling or rednessold surgical scar ankle foot  NEURO:  Oriented x3, cranial nerves 3-12 appear to be intact, no obvious focal weakness,gait within normal limits  SKIN: No acute rashes normal turgor, color, no bruising or petechiae. PSYCH: Oriented, good eye contact, no obvious depression anxiety, cognition and judgment appear normal. LN: no cervical axillary inguinal adenopathy No noted deficits in memory, attention, and speech.   Lab Results  Component Value Date   WBC 4.4 10/20/2017   HGB 11.8 (L) 10/20/2017   HCT 36.4 10/20/2017   PLT 200.0 10/20/2017   GLUCOSE 79 10/20/2017   CHOL 242 (H) 10/20/2017   TRIG 86.0 10/20/2017   HDL 66.80 10/20/2017   LDLDIRECT 141.9 08/06/2012   LDLCALC 158 (H) 10/20/2017   ALT 14  10/20/2017   AST 24 10/20/2017   NA 142 10/20/2017   K 4.3 10/20/2017   CL 102 10/20/2017   CREATININE 0.80 10/20/2017   BUN 22 10/20/2017   CO2 32 10/20/2017   TSH 1.05 10/20/2017   INR 1.04 04/10/2009    ASSESSMENT AND PLAN:  Discussed the following assessment and plan:  Visit for preventive health examination  Medication management - Plan: Comprehensive metabolic panel, Lipid panel, TSH, CBC with Differential/Platelet, CBC with Differential/Platelet, TSH, Lipid panel, Comprehensive metabolic panel  HYPERCHOLESTEROLEMIA - Plan: Comprehensive metabolic panel, Lipid panel, TSH, CBC with Differential/Platelet, CBC with Differential/Platelet, TSH, Lipid panel, Comprehensive metabolic panel  Iron deficiency anemia, unspecified iron deficiency anemia type - Plan: Comprehensive metabolic panel, Lipid panel, TSH, CBC with Differential/Platelet, CBC with Differential/Platelet, TSH, Lipid panel, Comprehensive metabolic panel  Hx of fall - mild hemtoma no  alarm sx not on anticoagulation follow etc  Leg length discrepancy  bp up some today but says at goal at home  Confirm and fu if not in control   Checking on going iron defic  dexa shows  -2.6 hip  Hx actonel wants to not take med at this time   Fall prevention disc  Water exercises but do land exercise   Counseled. Memory preservation factors    Patient Care Team: Andrew Soria, Standley Brooking, MD as PCP - General Servando Salina, MD (Obstetrics and Gynecology) Clent Jacks, MD (Ophthalmology) Mikey Bussing, DDS (Dentistry) Druscilla Brownie, MD as Consulting Physician (Dermatology) [name], MD as Consulting Physician (Hematology and Oncology) Gaynelle Arabian, MD as Consulting Physician (Orthopedic Surgery)  Patient Instructions  balance exercises  .    On land also .  To  a void future falls.   Make sure bp is at goal  Below  140 prefer 120 130   Fall Prevention in the [place] can cause injuries and can affect people from  all age groups. There are many simple things that you can do to make your home safe and to help prevent falls. What can I do on the outside of my home?  Regularly repair the edges of walkways and driveways and fix any cracks.  Remove high doorway thresholds.  Trim any shrubbery on the main path into your home.  Use bright outdoor lighting.  Clear walkways of debris and clutter, including tools and rocks.  Regularly check that handrails are securely fastened and in good repair. Both sides of any steps should have handrails.  Install guardrails along the edges of any raised decks or porches.  Have leaves, snow, and ice cleared regularly.  Use sand or salt on walkways during winter months.  In the garage, clean up any spills right away, including grease or oil spills. What can I do in the bathroom?  Use night lights.  Install grab bars by the toilet and in the tub and shower. Do not use towel bars as grab bars.  Use non-skid mats or decals on the floor of the tub or shower.  If you need to sit down while you are in the shower, use a plastic, non-slip stool.  Keep the floor dry. Immediately clean up any water that spills on the floor.  Remove soap buildup in the tub or shower on a regular basis.  Attach bath mats securely with double-sided non-slip rug tape.  Remove throw rugs and other tripping hazards from the floor. What can I do in the bedroom?  Use night lights.  Make sure that a bedside light is easy to reach.  Do not use oversized bedding that drapes onto the floor.  Have a firm chair that has side arms to use for getting dressed.  Remove throw rugs and other tripping hazards from the floor. What can I do in the kitchen?  Clean up any spills right away.  Avoid walking on wet floors.  Place frequently used items in easy-to-reach places.  If you need to reach for something above you, use a sturdy step stool that has a grab bar.  Keep electrical cables out  of the way.  Do not use floor polish or wax that makes floors slippery. If you have to use wax, make sure that it is non-skid floor wax.  Remove throw rugs and other tripping hazards from the floor. What can I do in the stairways?  Do not leave any items on the stairs.  Make sure that there are handrails on both sides of the stairs. Fix handrails that are broken or loose. Make sure that handrails are as long as the stairways.  Check any carpeting to make sure that it is firmly attached to the stairs. Fix any carpet that is loose or worn.  Avoid having throw rugs at the top or bottom of stairways, or secure the rugs with carpet tape to prevent them from moving.  Make sure that you have a light switch at the top of the stairs and the bottom of the stairs. If you do not have them, have them installed. What are some other fall prevention tips?  Wear closed-toe shoes that fit well and support your feet. Wear shoes that have rubber soles or low heels.  When you use a stepladder, make  sure that it is completely opened and that the sides are firmly locked. Have someone hold the ladder while you are using it. Do not climb a closed stepladder.  Add color or contrast paint or tape to grab bars and handrails in your home. Place contrasting color strips on the first and last steps.  Use mobility aids as needed, such as canes, walkers, scooters, and crutches.  Turn on lights if it is dark. Replace any light bulbs that burn out.  Set up furniture so that there are clear paths. Keep the furniture in the same spot.  Fix any uneven floor surfaces.  Choose a carpet design that does not hide the edge of steps of a stairway.  Be aware of any and all pets.  Review your medicines with your healthcare provider. Some medicines can cause dizziness or changes in blood pressure, which increase your risk of falling. Talk with your health care provider about other ways that you can decrease your risk of  falls. This may include working with a physical therapist or trainer to improve your strength, balance, and endurance. This information is not intended to replace advice given to you by your health care provider. Make sure you discuss any questions you have with your health care provider. Document Released: 04/01/2002 Document Revised: 09/08/2015 Document Reviewed: 05/16/2014 Elsevier Interactive Patient Education  2018 [place] Maintenance, Female Adopting a healthy lifestyle and getting preventive care can go a long way to promote health and wellness. Talk with your health care provider about what schedule of regular examinations is right for you. This is a good chance for you to check in with your provider about disease prevention and staying healthy. In between checkups, there are plenty of things you can do on your own. Experts have done a lot of research about which lifestyle changes and preventive measures are most likely to keep you healthy. Ask your health care provider for more information. Weight and diet Eat a healthy diet  Be sure to include plenty of vegetables, fruits, low-fat dairy products, and lean protein.  Do not eat a lot of foods high in solid fats, added sugars, or salt.  Get regular exercise. This is one of the most important things you can do for your health. ? Most adults should exercise for at least 150 minutes each week. The exercise should increase your heart rate and make you sweat (moderate-intensity exercise). ? Most adults should also do strengthening exercises at least twice a week. This is in addition to the moderate-intensity exercise.  Maintain a healthy weight  Body mass index (BMI) is a measurement that can be used to identify possible weight problems. It estimates body fat based on height and weight. Your health care provider can help determine your BMI and help you achieve or maintain a healthy weight.  For females 51 years of age and  older: ? A BMI below 18.5 is considered underweight. ? A BMI of 18.5 to 24.9 is normal. ? A BMI of 25 to 29.9 is considered overweight. ? A BMI of 30 and above is considered obese.  Watch levels of cholesterol and blood lipids  You should start having your blood tested for lipids and cholesterol at 76 years of age, then have this test every 5 years.  You may need to have your cholesterol levels checked more often if: ? Your lipid or cholesterol levels are high. ? You are older than 76 years of age. ? You are at  high risk for heart disease.  Cancer screening Lung Cancer  Lung cancer screening is recommended for adults 51-58 years old who are at high risk for lung cancer because of a history of smoking.  A yearly low-dose CT scan of the lungs is recommended for people who: ? Currently smoke. ? Have quit within the past 15 years. ? Have at least a 30-pack-year history of smoking. A pack year is smoking an average of one pack of cigarettes a day for 1 year.  Yearly screening should continue until it has been 15 years since you quit.  Yearly screening should stop if you develop a health problem that would prevent you from having lung cancer treatment.  Breast Cancer  Practice breast self-awareness. This means understanding how your breasts normally appear and feel.  It also means doing regular breast self-exams. Let your health care provider know about any changes, no matter how small.  If you are in your 20s or 30s, you should have a clinical breast exam (CBE) by a health care provider every 1-3 years as part of a regular health exam.  If you are 56 or older, have a CBE every year. Also consider having a breast X-ray (mammogram) every year.  If you have a family history of breast cancer, talk to your health care provider about genetic screening.  If you are at high risk for breast cancer, talk to your health care provider about having an MRI and a mammogram every year.  Breast  cancer gene (BRCA) assessment is recommended for women who have family members with BRCA-related cancers. BRCA-related cancers include: ? Breast. ? Ovarian. ? Tubal. ? Peritoneal cancers.  Results of the assessment will determine the need for genetic counseling and BRCA1 and BRCA2 testing.  Cervical Cancer Your health care provider may recommend that you be screened regularly for cancer of the pelvic organs (ovaries, uterus, and vagina). This screening involves a pelvic examination, including checking for microscopic changes to the surface of your cervix (Pap test). You may be encouraged to have this screening done every 3 years, beginning at age 80.  For women ages 81-65, health care providers may recommend pelvic exams and Pap testing every 3 years, or they may recommend the Pap and pelvic exam, combined with testing for human papilloma virus (HPV), every 5 years. Some types of HPV increase your risk of cervical cancer. Testing for HPV may also be done on women of any age with unclear Pap test results.  Other health care providers may not recommend any screening for nonpregnant women who are considered low risk for pelvic cancer and who do not have symptoms. Ask your health care provider if a screening pelvic exam is right for you.  If you have had past treatment for cervical cancer or a condition that could lead to cancer, you need Pap tests and screening for cancer for at least 20 years after your treatment. If Pap tests have been discontinued, your risk factors (such as having a new sexual partner) need to be reassessed to determine if screening should resume. Some women have medical problems that increase the chance of getting cervical cancer. In these cases, your health care provider may recommend more frequent screening and Pap tests.  Colorectal Cancer  This type of cancer can be detected and often prevented.  Routine colorectal cancer screening usually begins at 76 years of age and  continues through 76 years of age.  Your health care provider may recommend screening at an earlier  age if you have risk factors for colon cancer.  Your health care provider may also recommend using home test kits to check for hidden blood in the stool.  A small camera at the end of a tube can be used to examine your colon directly (sigmoidoscopy or colonoscopy). This is done to check for the earliest forms of colorectal cancer.  Routine screening usually begins at age 63.  Direct examination of the colon should be repeated every 5-10 years through 76 years of age. However, you may need to be screened more often if early forms of precancerous polyps or small growths are found.  Skin Cancer  Check your skin from head to toe regularly.  Tell your health care provider about any new moles or changes in moles, especially if there is a change in a mole's shape or color.  Also tell your health care provider if you have a mole that is larger than the size of a pencil eraser.  Always use sunscreen. Apply sunscreen liberally and repeatedly throughout the day.  Protect yourself by wearing long sleeves, pants, a wide-brimmed hat, and sunglasses whenever you are outside.  Heart disease, diabetes, and high blood pressure  High blood pressure causes heart disease and increases the risk of stroke. High blood pressure is more likely to develop in: ? People who have blood pressure in the high end of the normal range (130-139/85-89 mm Hg). ? People who are overweight or obese. ? People who are African American.  If you are 65-7 years of age, have your blood pressure checked every 3-5 years. If you are 31 years of age or older, have your blood pressure checked every year. You should have your blood pressure measured twice-once when you are at a hospital or clinic, and once when you are not at a hospital or clinic. Record the average of the two measurements. To check your blood pressure when you are not  at a hospital or clinic, you can use: ? An automated blood pressure machine at a pharmacy. ? A home blood pressure monitor.  If you are between 62 years and 30 years old, ask your health care provider if you should take aspirin to prevent strokes.  Have regular diabetes screenings. This involves taking a blood sample to check your fasting blood sugar level. ? If you are at a normal weight and have a low risk for diabetes, have this test once every three years after 76 years of age. ? If you are overweight and have a high risk for diabetes, consider being tested at a younger age or more often. Preventing infection Hepatitis B  If you have a higher risk for hepatitis B, you should be screened for this virus. You are considered at high risk for hepatitis B if: ? You were born in a country where hepatitis B is common. Ask your health care provider which countries are considered high risk. ? Your parents were born in a high-risk country, and you have not been immunized against hepatitis B (hepatitis B vaccine). ? You have HIV or AIDS. ? You use needles to inject street drugs. ? You live with someone who has hepatitis B. ? You have had sex with someone who has hepatitis B. ? You get hemodialysis treatment. ? You take certain medicines for conditions, including cancer, organ transplantation, and autoimmune conditions.  Hepatitis C  Blood testing is recommended for: ? Everyone born from [birth year] through 1964. ? Anyone with known risk factors for hepatitis C.  Sexually transmitted infections (STIs)  You should be screened for sexually transmitted infections (STIs) including gonorrhea and chlamydia if: ? You are sexually active and are younger than 76 years of age. ? You are older than 76 years of age and your health care provider tells you that you are at risk for this type of infection. ? Your sexual activity has changed since you were last screened and you are at an increased risk for chlamydia  or gonorrhea. Ask your health care provider if you are at risk.  If you do not have HIV, but are at risk, it may be recommended that you take a prescription medicine daily to prevent HIV infection. This is called pre-exposure prophylaxis (PrEP). You are considered at risk if: ? You are sexually active and do not regularly use condoms or know the HIV status of your partner(s). ? You take drugs by injection. ? You are sexually active with a partner who has HIV.  Talk with your health care provider about whether you are at high risk of being infected with HIV. If you choose to begin PrEP, you should first be tested for HIV. You should then be tested every 3 months for as long as you are taking PrEP. Pregnancy  If you are premenopausal and you may become pregnant, ask your health care provider about preconception counseling.  If you may become pregnant, take 400 to 800 micrograms (mcg) of folic acid every day.  If you want to prevent pregnancy, talk to your health care provider about birth control (contraception). Osteoporosis and menopause  Osteoporosis is a disease in which the bones lose minerals and strength with aging. This can result in serious bone fractures. Your risk for osteoporosis can be identified using a bone density scan.  If you are 91 years of age or older, or if you are at risk for osteoporosis and fractures, ask your health care provider if you should be screened.  Ask your health care provider whether you should take a calcium or vitamin D supplement to lower your risk for osteoporosis.  Menopause may have certain physical symptoms and risks.  Hormone replacement therapy may reduce some of these symptoms and risks. Talk to your health care provider about whether hormone replacement therapy is right for you. Follow these instructions at home:  Schedule regular health, dental, and eye exams.  Stay current with your immunizations.  Do not use any tobacco products  including cigarettes, chewing tobacco, or electronic cigarettes.  If you are pregnant, do not drink alcohol.  If you are breastfeeding, limit how much and how often you drink alcohol.  Limit alcohol intake to no more than 1 drink per day for nonpregnant women. One drink equals 12 ounces of beer, 5 ounces of wine, or 1 ounces of hard liquor.  Do not use street drugs.  Do not share needles.  Ask your health care provider for help if you need support or information about quitting drugs.  Tell your health care provider if you often feel depressed.  Tell your health care provider if you have ever been abused or do not feel safe at home. This information is not intended to replace advice given to you by your health care provider. Make sure you discuss any questions you have with your health care provider. Document Released: 10/25/2010 Document Revised: 09/17/2015 Document Reviewed: 01/13/2015 Elsevier Interactive Patient Education  2018 [place]. Azarion Hove M.D.

## 2017-10-19 NOTE — Progress Notes (Signed)
    Belinda Harper 21-Aug-1940 [id number]        77 y.o.  [id number]  RP: Pessary maintenance  HPI:  Well on Milex ring #6 with support.  No abnormal vaginal discharge.  No postmenopausal bleeding.  No pelvic pain or vaginal pain.  No urinary tract infection symptoms.  Mild stable stress urinary incontinence.  Bowel movements normal.  Followed by her family physician.   OB History  Gravida Para Term Preterm AB Living  3 2     1 2   SAB TAB Ectopic Multiple Live Births  1            # Outcome Date GA Lbr Len/2nd Weight Sex Delivery Anes PTL Lv  3 SAB           2 Para           1 Para             Past medical history,surgical history, problem list, medications, allergies, family history and social history were all reviewed and documented in the EPIC chart.   Directed ROS with pertinent positives and negatives documented in the history of present illness/assessment and plan.  Exam:  Vitals:   10/19/17 1542  BP: 128/70   General appearance:  Normal  Abdomen: Normal  Gynecologic exam: Vulva normal.  Pessary removed and cleaned.  Bimanual exam:  Uterus AV, normal volume, mobile, NT.  No adnexal mass, NT.  Vaginal mucosa normal, intact.  Pessary put back in place in vagina easily.   Assessment/Plan:  77 y.o. O2H4765   1. Encounter for pessary maintenance Well with Milex ring #6 with support.  Satisfied with pessary and no complications.  Full pelvic exam normal today, done to complete the annual exam that patient is having with her family physician every year.  Continues to have screening mammogram, last one in May 2019 was negative.  Patient will follow-up in 4 to 5 months for pessary maintenance.  Counseling on above issues and coordination of care more than 50% for 15 minutes.   [name] MD, 4:00 PM 10/19/2017

## 2017-10-19 NOTE — Patient Instructions (Signed)
1. Encounter for pessary maintenance Well with Milex ring #6 with support.  Satisfied with pessary and no complications.  Full pelvic exam normal today, done to complete the annual exam that patient is having with her family physician every year.  Continues to have screening mammogram, last one in May 2019 was negative.  Patient will follow-up in 4 to 5 months for pessary maintenance.  Belinda Harper, good seeing you today!

## 2017-10-19 NOTE — Progress Notes (Addendum)
Subjective:   Belinda Harper is a 77 y.o. female who presents for Medicare Annual (Subsequent) preventive examination.  Reports health as good  Apt with Dr. Regis Bill at [facility] alone but has a friend that rents the top of her garage  Her home is 3000 sq feet; loves the neighborhood;  [place] Has 2 sons Older one has 2 dtrs and she raised them Oldest grand dtr is due first great grand baby  Younger son., in [place]; has 72 yo   Diet BMI 23.3 Chol/hdl 3 Breakfast cereal with barley; 1/4 cup of grape-nuts Add a little fruit  Lunch; eats out; baked potato, turnip greens and hushpuppies Supper lots of vegetables or fruit s May fix tuna salad   Exercise Summer; working in the yard, [employer] her yard Water aerobic x 2 per week [employer] [facility]    meds  Takes otc iron because she is anemic    There are no preventive care reminders to display for this patient.  Colonoscopy due 08/2021 Mammogram 08/2017- (is noted in HM under dexa scan)   Dexa 08/2017 report states osteoporosis    Educated on shingrix  Cardiac Risk Factors include: advanced age (>16men, >50 women);family history of premature cardiovascular disease;hypertension     Objective:     Vitals: BP (!) 142/60   Pulse (!) 56   Ht 5\' 7"  (1.702 m)   Wt 148 lb 5 oz (67.3 kg)   SpO2 98%   BMI 23.23 kg/m   Body mass index is 23.23 kg/m.  Advanced Directives 10/20/2017 02/21/2015 02/18/2015 02/05/2015 09/10/2012  Does Patient Have a Medical Advance Directive? No No No No Patient does not have advance directive  Would patient like information on creating a medical advance directive? - No - patient declined information - No - patient declined information -   Has the paper work but has not completed Had a good discussion regarding end of life and referred to the pastoral department at [facility] for assistance and clarification  Tobacco Social History   Tobacco Use  Smoking Status Never Smoker  Smokeless Tobacco  Never Used     Counseling given: Yes   Clinical Intake:  Past Medical History:  Diagnosis Date  . 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 ALLERGIC RHINITIS 01/03/2007  . 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 Anxiety   . ASTHMA 01/03/2007  . Blood transfusion 12 2010   hg 4 transfused 4 units  . DEPRESSION 01/03/2007   resolved  . Family history of adverse reaction to anesthesia    pt's mother as she gets older organs shut down   . FRACTURE, ANKLE, LEFT 04/10/2009  . Gastritis 2002  . GERD (gastroesophageal reflux disease)   . HEMORRHOIDS 05/19/2008  . HYPERCHOLESTEROLEMIA 05/19/2008  . Normal cardiac stress test    2014  . OSTEOARTHRITIS 01/03/2007  . OSTEOPOROSIS 01/03/2007   hx of actonbel use for at least 5 years   . Sepsis due to cellulitis ([place]) 02/05/2015  . Unspecified vitamin D deficiency 04/06/2007  . UTERINE PROLAPSE 04/06/2007   Past Surgical History:  Procedure Laterality Date  . ANKLE FRACTURE SURGERY  02/2009   left ankle fracture and dislocation  . CATARACT EXTRACTION Left    last year   . CATARACT EXTRACTION W/ INTRAOCULAR LENS IMPLANT  2000   right eye  . 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 ORIF FINGER / THUMB FRACTURE  1970   reattached  . PANENDOSCOPY     small bowel bx   Family History  Problem Relation Age of Onset  . Hyperlipidemia Mother   . Heart disease Mother        CABG age 59  . Ovarian cancer Mother   . Uterine cancer Mother   . Arthritis Mother   . Osteoporosis Mother   . Heart disease Father        CABG age 51  . Arthritis Father     . Melanoma Father    Social History   Socioeconomic History  . Marital status: Divorced    Spouse name: Not on file  . Number of children: 2  . Years of education: Not on file  . Highest education level: Not on file  Occupational History  . Not on file  Social Needs  . Financial resource strain: Not on file  . Food insecurity:    Worry: Not on file    Inability: Not on file  . Transportation needs:    Medical: Not on file    Non-medical: Not on file  Tobacco Use  . Smoking status: Never Smoker  . Smokeless tobacco: Never Used  Substance and Sexual Activity  . Alcohol use: Yes    Comment: rare  . Drug use: No  . Sexual activity: Not Currently    Comment: 1st intercourse- 18, partners- 1   Lifestyle  . Physical activity:    Days per week: Not on file    Minutes per session: Not on file  . Stress: Not on file  Relationships  . Social connections:    Talks on phone: Not on file    Gets together: Not on file    Attends religious service: Not on file    Active member of club or organization: Not on file    Attends meetings of clubs or organizations: Not on file    Relationship status: Not on file  Other Topics Concern  . Not on file  Social History Narrative   HH of  1   To rent out room    Retired Dispensing optician   Never smoked    Pet cat allergic    Divorced   Regular exericise  walking mowing the lawnswimming   Silver sneakers     Outpatient Encounter Medications as of 10/20/2017  Medication Sig  . amLODipine (NORVASC) 5 MG tablet Take 1 tablet (5 mg total) by mouth daily.  [name] BIOTIN PO Take by mouth daily.   . Calcium Citrate-Vitamin D (CITRACAL + D PO) Take 500 mg by mouth daily. Has 250IU of vitamin D3 and 80mg  of magnesium  . Cholecalciferol (VITAMIN D3 PO) Take by mouth.  . ferrous gluconate (FERGON) 225 (27 FE) MG tablet Take 324 mg by mouth every evening.   . Magnesium 100 MG CAPS Take by mouth.  . Menaquinone-7 (VITAMIN K2 PO) Take by mouth daily.   . Multiple Vitamins-Minerals (ONE-A-DAY WOMENS VITACRAVES PO) Take 1 tablet by mouth daily.  . vitamin C (ASCORBIC ACID) 500 MG tablet Take 500 mg by mouth daily.   [name] docusate sodium (COLACE) 100 MG capsule Take 100 mg by  mouth every evening.   . Glucos-Chondroit-Hyaluron-MSM (GLUCOSAMINE CHONDROITIN JOINT) TABS Take 2 tablets by mouth daily.    Facility-Administered Encounter Medications as of 10/20/2017  Medication  . cloNIDine (CATAPRES) tablet 0.1 mg    Activities of Daily Living In your present state of health, do you have any difficulty performing the following activities: 10/20/2017  Hearing? N  Vision? N  Walking or climbing stairs? N  Dressing or bathing? N  Doing errands, shopping? N  Preparing Food and eating ? N  Using the Toilet? N  In the past six months, have you accidently leaked urine? Y  Comment has pessary managed by gyn  Do you have problems with loss of bowel control? N  Managing your Medications? N  Managing your Finances? N  Housekeeping or managing your Housekeeping? N  Some recent data might be hidden    Patient Care Team: Panosh, Standley Brooking, MD as PCP - General Servando Salina, MD (Obstetrics and Gynecology) Inda Castle, MD (Inactive) (Gastroenterology) Clent Jacks, MD (Ophthalmology) Mikey Bussing, DDS (Dentistry) Druscilla Brownie, MD as Consulting Physician (Dermatology) [name], MD as Consulting Physician (Hematology and Oncology) Gaynelle Arabian, MD as Consulting Physician (Orthopedic Surgery)    Assessment:   This is a routine wellness examination for Belinda Harper.  Exercise Activities and Dietary recommendations Current Exercise Habits: Structured exercise class;Home exercise routine, Type of exercise: walking;strength training/weights(water aerobics ), Time (Minutes): 60, Frequency (Times/Week): 4, Weekly Exercise (Minutes/Week): 240, Intensity: Moderate  Goals    . Patient Stated     Signed up for a bus trip to [facility] in  November! Continue to plan trips away!       Fall Risk Fall Risk  10/20/2017 10/18/2016 10/07/2015 10/01/2014 12/18/2013  Falls in the past year? Yes Yes Yes Yes No  Comment fell last week; in her driveway on concrete  - - fell on ice  -  Number falls in past yr: 2 or more 2 or more - 2 or more -  Comment - - - tripped ove  shoes   -  Injury with Fall? - No - No -  Risk for fall due to : History of fall(s) - - - -  Follow up Education provided - - Education provided -   States she has falls;  Walked into door and fell backwards on concrete Fall once a month  Depression Screen PHQ 2/9 Scores 10/20/2017 10/18/2016 10/07/2015 10/01/2014  PHQ - 2 Score 0 0 0 0     Cognitive Function Can't remember what I just did Just in the last year Seems to be getting more prevalent; father had Alz    Ad8 score reviewed for issues:  Issues making decisions:  Less interest in hobbies / activities:  Repeats questions, stories (family complaining):  Trouble using ordinary gadgets (microwave, computer, phone):  Forgets the month or year:   Mismanaging finances:   Remembering appts:  Daily problems with thinking and/or memory: Ad8 score is=1 today       Immunization History  Administered Date(s) Administered  . Influenza Whole 03/10/2009, 01/27/2010  . Influenza, High Dose Seasonal PF 03/23/2016, 04/24/2017  . Pneumococcal Conjugate-13 12/18/2013  . Pneumococcal Polysaccharide-23 04/25/2006, 10/18/2012  . Td 04/25/1997, 06/22/2009  . Zoster 06/28/2010      Screening Tests Health Maintenance  Topic Date Due  . INFLUENZA VACCINE  11/23/2017  . TETANUS/TDAP  06/23/2019  . COLONOSCOPY  08/29/2021  . DEXA SCAN  Completed  . PNA vac Low Risk Adult  Completed         Plan:      PCP Notes   Health Maintenance Discussed shingrix Completion of an advanced directive or discussing with family Colonoscopy due 08/2021 Mammogram 08/2017- (is noted in HM under dexa scan)   Dexa  08/2017 report states osteoporosis  Call placed to [facility] and they are faxing the bone density to my attention today    Abnormal Screens  Concerned about memory, states she does remember what she reads etc, but MMSE 30/30  Educated regarding memory; Father had Alz and she is "watchful"   Referrals  none  Patient concerns; Hip starts hurting when she starts walking  States she falls once a month  Does not appear to be a high fall risk, states she is not focused. States at one time she was "veering left' at one time but put orthotic in shoe  But she still falls  Still has some dizziness on occasion   Has pessary and now incontinent; wears a pad at all times  Nurse Concerns; As noted   Next PCP apt today      I have personally reviewed and noted the following in the patient's chart:   . Medical and social history . Use of alcohol, tobacco or illicit drugs  . Current medications and supplements . Functional ability and status . Nutritional status . Physical activity . Advanced directives . List of other physicians . Hospitalizations, surgeries, and ER visits in previous 12 months . Vitals . Screenings to include cognitive, depression, and falls . Referrals and appointments  In addition, I have reviewed and discussed with patient certain preventive protocols, quality metrics, and best practice recommendations. A written personalized care plan for preventive services as well as general preventive health recommendations were provided to patient.     UEAVW,UJWJX, RN  10/20/2017    Above noted reviewed and agree. Shanon Ace, MD

## 2017-10-20 ENCOUNTER — Ambulatory Visit (INDEPENDENT_AMBULATORY_CARE_PROVIDER_SITE_OTHER): Payer: Medicare Other

## 2017-10-20 ENCOUNTER — Encounter: Payer: Self-pay | Admitting: Internal Medicine

## 2017-10-20 ENCOUNTER — Ambulatory Visit (INDEPENDENT_AMBULATORY_CARE_PROVIDER_SITE_OTHER): Payer: Medicare Other | Admitting: Internal Medicine

## 2017-10-20 VITALS — BP 142/60 | HR 56 | Ht 67.0 in | Wt 148.3 lb

## 2017-10-20 VITALS — BP 150/80 | Temp 97.7°F | Ht 67.0 in | Wt 148.5 lb

## 2017-10-20 DIAGNOSIS — Z9181 History of falling: Secondary | ICD-10-CM | POA: Diagnosis not present

## 2017-10-20 DIAGNOSIS — M2170 Unequal limb length (acquired), unspecified site: Secondary | ICD-10-CM

## 2017-10-20 DIAGNOSIS — Z79899 Other long term (current) drug therapy: Secondary | ICD-10-CM | POA: Diagnosis not present

## 2017-10-20 DIAGNOSIS — E7800 Pure hypercholesterolemia, unspecified: Secondary | ICD-10-CM

## 2017-10-20 DIAGNOSIS — D509 Iron deficiency anemia, unspecified: Secondary | ICD-10-CM

## 2017-10-20 DIAGNOSIS — Z0000 Encounter for general adult medical examination without abnormal findings: Secondary | ICD-10-CM | POA: Diagnosis not present

## 2017-10-20 LAB — CBC WITH DIFFERENTIAL/PLATELET
Basophils Absolute: 0 10*3/uL (ref 0.0–0.1)
Basophils Relative: 1 % (ref 0.0–3.0)
Eosinophils Absolute: 0.1 10*3/uL (ref 0.0–0.7)
Eosinophils Relative: 3.3 % (ref 0.0–5.0)
HCT: 36.4 % (ref 36.0–46.0)
Hemoglobin: 11.8 g/dL — ABNORMAL LOW (ref 12.0–15.0)
Lymphocytes Relative: 25.8 % (ref 12.0–46.0)
Lymphs Abs: 1.1 10*3/uL (ref 0.7–4.0)
MCHC: 32.4 g/dL (ref 30.0–36.0)
MCV: 84.6 fl (ref 78.0–100.0)
Monocytes Absolute: 0.3 10*3/uL (ref 0.1–1.0)
Monocytes Relative: 6.8 % (ref 3.0–12.0)
Neutro Abs: 2.8 10*3/uL (ref 1.4–7.7)
Neutrophils Relative %: 63.1 % (ref 43.0–77.0)
Platelets: 200 10*3/uL (ref 150.0–400.0)
RBC: 4.3 Mil/uL (ref 3.87–5.11)
RDW: 15.6 % — ABNORMAL HIGH (ref 11.5–15.5)
WBC: 4.4 10*3/uL (ref 4.0–10.5)

## 2017-10-20 LAB — COMPREHENSIVE METABOLIC PANEL
ALT: 14 U/L (ref 0–35)
AST: 24 U/L (ref 0–37)
Albumin: 4.4 g/dL (ref 3.5–5.2)
Alkaline Phosphatase: 83 U/L (ref 39–117)
BUN: 22 mg/dL (ref 6–23)
CO2: 32 mEq/L (ref 19–32)
Calcium: 10 mg/dL (ref 8.4–10.5)
Chloride: 102 mEq/L (ref 96–112)
Creatinine, Ser: 0.8 mg/dL (ref 0.40–1.20)
GFR: 74.18 mL/min (ref >60.00)
Glucose, Bld: 79 mg/dL (ref 70–99)
Potassium: 4.3 mEq/L (ref 3.5–5.1)
Sodium: 142 mEq/L (ref 135–145)
Total Bilirubin: 0.4 mg/dL (ref 0.2–1.2)
Total Protein: 7.6 g/dL (ref 6.0–8.3)

## 2017-10-20 LAB — LIPID PANEL
Cholesterol: 242 mg/dL — ABNORMAL HIGH (ref 0–200)
HDL: 66.8 mg/dL (ref >39.00)
LDL Cholesterol: 158 mg/dL — ABNORMAL HIGH (ref 0–99)
NonHDL: 175.69
Total CHOL/HDL Ratio: 4
Triglycerides: 86 mg/dL (ref 0.0–149.0)
VLDL: 17.2 mg/dL (ref 0.0–40.0)

## 2017-10-20 LAB — TSH: TSH: 1.05 u[IU]/mL (ref 0.35–4.50)

## 2017-10-20 NOTE — Patient Instructions (Addendum)
Belinda Harper , Thank you for taking time to come for your Medicare Wellness Visit. I appreciate your ongoing commitment to your health goals. Please review the following plan we discussed and let me know if I can assist you in the future.  Continue to use your brain power.    Shingrix is a vaccine for the prevention of Shingles in Adults 50 and older.  If you are on Medicare, the shingrix is covered under your Part D plan, so you will take both of the vaccines in the series at your pharmacy. Please check with your benefits regarding applicable copays or out of pocket expenses.  The Shingrix is given in 2 vaccines approx 8 weeks apart. You must receive the 2nd dose prior to 6 months from receipt of the first. Please have the pharmacist print out you Immunization  dates for our office records   Will try to complete AD; Given copy  Referred to [facility] for questions [place] offers free advance directive forms, as well as assistance in completing the forms themselves. For assistance, contact the Spiritual Care Department at [phone], or the Clinical Social Work Department at [phone].  You can have a hearing screen anytime.     These are the goals we discussed: Goals    . Patient Stated     Signed up for a bus trip to [facility] in November! Continue to plan trips away!       This is a list of the screening recommended for you and due dates:  Health Maintenance  Topic Date Due  . Flu Shot  11/23/2017  . Tetanus Vaccine  06/23/2019  . Colon Cancer Screening  08/29/2021  . DEXA scan (bone density measurement)  Completed  . Pneumonia vaccines  Completed    Kegel Exercises Kegel exercises help strengthen the muscles that support the rectum, vagina, small intestine, bladder, and uterus. Doing Kegel exercises can help:  Improve bladder and bowel control.  Improve sexual response.  Reduce problems and discomfort during pregnancy.  Kegel exercises involve squeezing your  pelvic floor muscles, which are the same muscles you squeeze when you try to stop the flow of urine. The exercises can be done while sitting, standing, or lying down, but it is best to vary your position. Phase 1 exercises 1. Squeeze your pelvic floor muscles tight. You should feel a tight lift in your rectal area. If you are a female, you should also feel a tightness in your vaginal area. Keep your stomach, buttocks, and legs relaxed. 2. Hold the muscles tight for up to 10 seconds. 3. Relax your muscles. Repeat this exercise 50 times a day or as many times as told by your health care provider. Continue to do this exercise for at least 4-6 weeks or for as long as told by your health care provider. This information is not intended to replace advice given to you by your health care provider. Make sure you discuss any questions you have with your health care provider. Document Released: 03/28/2012 Document Revised: 12/05/2015 Document Reviewed: 03/01/2015 Elsevier Interactive Patient Education  2018 [place] in the Home Falls can cause injuries. They can happen to people of all ages. There are many things you can do to make your home safe and to help prevent falls. What can I do on the outside of my home?  Regularly fix the edges of walkways and driveways and fix any cracks.  Remove anything that might make you trip  as you walk through a door, such as a raised step or threshold.  Trim any bushes or trees on the path to your home.  Use bright outdoor lighting.  Clear any walking paths of anything that might make someone trip, such as rocks or tools.  Regularly check to see if handrails are loose or broken. Make sure that both sides of any steps have handrails.  Any raised decks and porches should have guardrails on the edges.  Have any leaves, snow, or ice cleared regularly.  Use sand or salt on walking paths during winter.  Clean up any spills in your garage  right away. This includes oil or grease spills. What can I do in the bathroom?  Use night lights.  Install grab bars by the toilet and in the tub and shower. Do not use towel bars as grab bars.  Use non-skid mats or decals in the tub or shower.  If you need to sit down in the shower, use a plastic, non-slip stool.  Keep the floor dry. Clean up any water that spills on the floor as soon as it happens.  Remove soap buildup in the tub or shower regularly.  Attach bath mats securely with double-sided non-slip rug tape.  Do not have throw rugs and other things on the floor that can make you trip. What can I do in the bedroom?  Use night lights.  Make sure that you have a light by your bed that is easy to reach.  Do not use any sheets or blankets that are too big for your bed. They should not hang down onto the floor.  Have a firm chair that has side arms. You can use this for support while you get dressed.  Do not have throw rugs and other things on the floor that can make you trip. What can I do in the kitchen?  Clean up any spills right away.  Avoid walking on wet floors.  Keep items that you use a lot in easy-to-reach places.  If you need to reach something above you, use a strong step stool that has a grab bar.  Keep electrical cords out of the way.  Do not use floor polish or wax that makes floors slippery. If you must use wax, use non-skid floor wax.  Do not have throw rugs and other things on the floor that can make you trip. What can I do with my stairs?  Do not leave any items on the stairs.  Make sure that there are handrails on both sides of the stairs and use them. Fix handrails that are broken or loose. Make sure that handrails are as long as the stairways.  Check any carpeting to make sure that it is firmly attached to the stairs. Fix any carpet that is loose or worn.  Avoid having throw rugs at the top or bottom of the stairs. If you do have throw rugs,  attach them to the floor with carpet tape.  Make sure that you have a light switch at the top of the stairs and the bottom of the stairs. If you do not have them, ask someone to add them for you. What else can I do to help prevent falls?  Wear shoes that: ? Do not have high heels. ? Have rubber bottoms. ? Are comfortable and fit you well. ? Are closed at the toe. Do not wear sandals.  If you use a stepladder: ? Make sure that it is fully opened.  Do not climb a closed stepladder. ? Make sure that both sides of the stepladder are locked into place. ? Ask someone to hold it for you, if possible.  Clearly mark and make sure that you can see: ? Any grab bars or handrails. ? First and last steps. ? Where the edge of each step is.  Use tools that help you move around (mobility aids) if they are needed. These include: ? Canes. ? Walkers. ? Scooters. ? Crutches.  Turn on the lights when you go into a dark area. Replace any light bulbs as soon as they burn out.  Set up your furniture so you have a clear path. Avoid moving your furniture around.  If any of your floors are uneven, fix them.  If there are any pets around you, be aware of where they are.  Review your medicines with your doctor. Some medicines can make you feel dizzy. This can increase your chance of falling. Ask your doctor what other things that you can do to help prevent falls. This information is not intended to replace advice given to you by your health care provider. Make sure you discuss any questions you have with your health care provider. Document Released: 02/05/2009 Document Revised: 09/17/2015 Document Reviewed: 05/16/2014 Elsevier Interactive Patient Education  2018 [place] Maintenance, Female Adopting a healthy lifestyle and getting preventive care can go a long way to promote health and wellness. Talk with your health care provider about what schedule of regular examinations is right for you.  This is a good chance for you to check in with your provider about disease prevention and staying healthy. In between checkups, there are plenty of things you can do on your own. Experts have done a lot of research about which lifestyle changes and preventive measures are most likely to keep you healthy. Ask your health care provider for more information. Weight and diet Eat a healthy diet  Be sure to include plenty of vegetables, fruits, low-fat dairy products, and lean protein.  Do not eat a lot of foods high in solid fats, added sugars, or salt.  Get regular exercise. This is one of the most important things you can do for your health. ? Most adults should exercise for at least 150 minutes each week. The exercise should increase your heart rate and make you sweat (moderate-intensity exercise). ? Most adults should also do strengthening exercises at least twice a week. This is in addition to the moderate-intensity exercise.  Maintain a healthy weight  Body mass index (BMI) is a measurement that can be used to identify possible weight problems. It estimates body fat based on height and weight. Your health care provider can help determine your BMI and help you achieve or maintain a healthy weight.  For females 33 years of age and older: ? A BMI below 18.5 is considered underweight. ? A BMI of 18.5 to 24.9 is normal. ? A BMI of 25 to 29.9 is considered overweight. ? A BMI of 30 and above is considered obese.  Watch levels of cholesterol and blood lipids  You should start having your blood tested for lipids and cholesterol at 76 years of age, then have this test every 5 years.  You may need to have your cholesterol levels checked more often if: ? Your lipid or cholesterol levels are high. ? You are older than 76 years of age. ? You are at high risk for heart disease.  Cancer screening Lung Cancer  Lung cancer screening is recommended for adults 77-30 years old who are at high risk  for lung cancer because of a history of smoking.  A yearly low-dose CT scan of the lungs is recommended for people who: ? Currently smoke. ? Have quit within the past 15 years. ? Have at least a 30-pack-year history of smoking. A pack year is smoking an average of one pack of cigarettes a day for 1 year.  Yearly screening should continue until it has been 15 years since you quit.  Yearly screening should stop if you develop a health problem that would prevent you from having lung cancer treatment.  Breast Cancer  Practice breast self-awareness. This means understanding how your breasts normally appear and feel.  It also means doing regular breast self-exams. Let your health care provider know about any changes, no matter how small.  If you are in your 20s or 30s, you should have a clinical breast exam (CBE) by a health care provider every 1-3 years as part of a regular health exam.  If you are 35 or older, have a CBE every year. Also consider having a breast X-ray (mammogram) every year.  If you have a family history of breast cancer, talk to your health care provider about genetic screening.  If you are at high risk for breast cancer, talk to your health care provider about having an MRI and a mammogram every year.  Breast cancer gene (BRCA) assessment is recommended for women who have family members with BRCA-related cancers. BRCA-related cancers include: ? Breast. ? Ovarian. ? Tubal. ? Peritoneal cancers.  Results of the assessment will determine the need for genetic counseling and BRCA1 and BRCA2 testing.  Cervical Cancer Your health care provider may recommend that you be screened regularly for cancer of the pelvic organs (ovaries, uterus, and vagina). This screening involves a pelvic examination, including checking for microscopic changes to the surface of your cervix (Pap test). You may be encouraged to have this screening done every 3 years, beginning at age 30.  For women  ages 80-65, health care providers may recommend pelvic exams and Pap testing every 3 years, or they may recommend the Pap and pelvic exam, combined with testing for human papilloma virus (HPV), every 5 years. Some types of HPV increase your risk of cervical cancer. Testing for HPV may also be done on women of any age with unclear Pap test results.  Other health care providers may not recommend any screening for nonpregnant women who are considered low risk for pelvic cancer and who do not have symptoms. Ask your health care provider if a screening pelvic exam is right for you.  If you have had past treatment for cervical cancer or a condition that could lead to cancer, you need Pap tests and screening for cancer for at least 20 years after your treatment. If Pap tests have been discontinued, your risk factors (such as having a new sexual partner) need to be reassessed to determine if screening should resume. Some women have medical problems that increase the chance of getting cervical cancer. In these cases, your health care provider may recommend more frequent screening and Pap tests.  Colorectal Cancer  This type of cancer can be detected and often prevented.  Routine colorectal cancer screening usually begins at 76 years of age and continues through 76 years of age.  Your health care provider may recommend screening at an earlier age if you have risk factors for colon cancer.  Your  health care provider may also recommend using home test kits to check for hidden blood in the stool.  A small camera at the end of a tube can be used to examine your colon directly (sigmoidoscopy or colonoscopy). This is done to check for the earliest forms of colorectal cancer.  Routine screening usually begins at age 70.  Direct examination of the colon should be repeated every 5-10 years through 76 years of age. However, you may need to be screened more often if early forms of precancerous polyps or small growths  are found.  Skin Cancer  Check your skin from head to toe regularly.  Tell your health care provider about any new moles or changes in moles, especially if there is a change in a mole's shape or color.  Also tell your health care provider if you have a mole that is larger than the size of a pencil eraser.  Always use sunscreen. Apply sunscreen liberally and repeatedly throughout the day.  Protect yourself by wearing long sleeves, pants, a wide-brimmed hat, and sunglasses whenever you are outside.  Heart disease, diabetes, and high blood pressure  High blood pressure causes heart disease and increases the risk of stroke. High blood pressure is more likely to develop in: ? People who have blood pressure in the high end of the normal range (130-139/85-89 mm Hg). ? People who are overweight or obese. ? People who are African American.  If you are 34-61 years of age, have your blood pressure checked every 3-5 years. If you are 71 years of age or older, have your blood pressure checked every year. You should have your blood pressure measured twice-once when you are at a hospital or clinic, and once when you are not at a hospital or clinic. Record the average of the two measurements. To check your blood pressure when you are not at a hospital or clinic, you can use: ? An automated blood pressure machine at a pharmacy. ? A home blood pressure monitor.  If you are between 68 years and 57 years old, ask your health care provider if you should take aspirin to prevent strokes.  Have regular diabetes screenings. This involves taking a blood sample to check your fasting blood sugar level. ? If you are at a normal weight and have a low risk for diabetes, have this test once every three years after 76 years of age. ? If you are overweight and have a high risk for diabetes, consider being tested at a younger age or more often. Preventing infection Hepatitis B  If you have a higher risk for hepatitis  B, you should be screened for this virus. You are considered at high risk for hepatitis B if: ? You were born in a country where hepatitis B is common. Ask your health care provider which countries are considered high risk. ? Your parents were born in a high-risk country, and you have not been immunized against hepatitis B (hepatitis B vaccine). ? You have HIV or AIDS. ? You use needles to inject street drugs. ? You live with someone who has hepatitis B. ? You have had sex with someone who has hepatitis B. ? You get hemodialysis treatment. ? You take certain medicines for conditions, including cancer, organ transplantation, and autoimmune conditions.  Hepatitis C  Blood testing is recommended for: ? Everyone born from [birth year] through 1964. ? Anyone with known risk factors for hepatitis C.  Sexually transmitted infections (STIs)  You should be screened for  sexually transmitted infections (STIs) including gonorrhea and chlamydia if: ? You are sexually active and are younger than 76 years of age. ? You are older than 76 years of age and your health care provider tells you that you are at risk for this type of infection. ? Your sexual activity has changed since you were last screened and you are at an increased risk for chlamydia or gonorrhea. Ask your health care provider if you are at risk.  If you do not have HIV, but are at risk, it may be recommended that you take a prescription medicine daily to prevent HIV infection. This is called pre-exposure prophylaxis (PrEP). You are considered at risk if: ? You are sexually active and do not regularly use condoms or know the HIV status of your partner(s). ? You take drugs by injection. ? You are sexually active with a partner who has HIV.  Talk with your health care provider about whether you are at high risk of being infected with HIV. If you choose to begin PrEP, you should first be tested for HIV. You should then be tested every 3 months for as  long as you are taking PrEP. Pregnancy  If you are premenopausal and you may become pregnant, ask your health care provider about preconception counseling.  If you may become pregnant, take 400 to 800 micrograms (mcg) of folic acid every day.  If you want to prevent pregnancy, talk to your health care provider about birth control (contraception). Osteoporosis and menopause  Osteoporosis is a disease in which the bones lose minerals and strength with aging. This can result in serious bone fractures. Your risk for osteoporosis can be identified using a bone density scan.  If you are 83 years of age or older, or if you are at risk for osteoporosis and fractures, ask your health care provider if you should be screened.  Ask your health care provider whether you should take a calcium or vitamin D supplement to lower your risk for osteoporosis.  Menopause may have certain physical symptoms and risks.  Hormone replacement therapy may reduce some of these symptoms and risks. Talk to your health care provider about whether hormone replacement therapy is right for you. Follow these instructions at home:  Schedule regular health, dental, and eye exams.  Stay current with your immunizations.  Do not use any tobacco products including cigarettes, chewing tobacco, or electronic cigarettes.  If you are pregnant, do not drink alcohol.  If you are breastfeeding, limit how much and how often you drink alcohol.  Limit alcohol intake to no more than 1 drink per day for nonpregnant women. One drink equals 12 ounces of beer, 5 ounces of wine, or 1 ounces of hard liquor.  Do not use street drugs.  Do not share needles.  Ask your health care provider for help if you need support or information about quitting drugs.  Tell your health care provider if you often feel depressed.  Tell your health care provider if you have ever been abused or do not feel safe at home. This information is not intended  to replace advice given to you by your health care provider. Make sure you discuss any questions you have with your health care provider. Document Released: 10/25/2010 Document Revised: 09/17/2015 Document Reviewed: 01/13/2015 Elsevier Interactive Patient Education  2018 [employer].   Hearing Loss Hearing loss is a partial or total loss of the ability to hear. This can be temporary or permanent, and it can  happen in one or both ears. Hearing loss may be referred to as deafness. Medical care is necessary to treat hearing loss properly and to prevent the condition from getting worse. Your hearing may partially or completely come back, depending on what caused your hearing loss and how severe it is. In some cases, hearing loss is permanent. What are the causes? Common causes of hearing loss include:  Too much wax in the ear canal.  Infection of the ear canal or middle ear.  Fluid in the middle ear.  Injury to the ear or surrounding area.  An object stuck in the ear.  Prolonged exposure to loud sounds, such as music.  Less common causes of hearing loss include:  Tumors in the ear.  Viral or bacterial infections, such as meningitis.  A hole in the eardrum (perforated eardrum).  Problems with the hearing nerve that sends signals between the brain and the ear.  Certain medicines.  What are the signs or symptoms? Symptoms of this condition may include:  Difficulty telling the difference between sounds.  Difficulty following a conversation when there is background noise.  Lack of response to sounds in your environment. This may be most noticeable when you do not respond to startling sounds.  Needing to turn up the volume on the television, radio, etc.  Ringing in the ears.  Dizziness.  Pain in the ears.  How is this diagnosed? This condition is diagnosed based on a physical exam and a hearing test (audiometry). The audiometry test will be performed by a hearing  specialist (audiologist). You may also be referred to an ear, nose, and throat (ENT) specialist (otolaryngologist). How is this treated? Treatment for recent onset of hearing loss may include:  Ear wax removal.  Being prescribed medicines to prevent infection (antibiotics).  Being prescribed medicines to reduce inflammation (corticosteroids).  Follow these instructions at home:  If you were prescribed an antibiotic medicine, take it as told by your health care provider. Do not stop taking the antibiotic even if you start to feel better.  Take over-the-counter and prescription medicines only as told by your health care provider.  Avoid loud noises.  Return to your normal activities as told by your health care provider. Ask your health care provider what activities are safe for you.  Keep all follow-up visits as told by your health care provider. This is important. Contact a health care provider if:  You feel dizzy.  You develop new symptoms.  You vomit or feel nauseous.  You have a fever. Get help right away if:  You develop sudden changes in your vision.  You have severe ear pain.  You have new or increased weakness.  You have a severe headache. This information is not intended to replace advice given to you by your health care provider. Make sure you discuss any questions you have with your health care provider. Document Released: 04/11/2005 Document Revised: 09/17/2015 Document Reviewed: 08/27/2014 Elsevier Interactive Patient Education  2018 [employer].

## 2017-10-20 NOTE — Patient Instructions (Addendum)
balance exercises  .    On land also .  To a void future falls.   Make sure bp is at goal  Below  140 prefer 120 130   Fall Prevention in the [place] can cause injuries and can affect people from all age groups. There are many simple things that you can do to make your home safe and to help prevent falls. What can I do on the outside of my home?  Regularly repair the edges of walkways and driveways and fix any cracks.  Remove high doorway thresholds.  Trim any shrubbery on the main path into your home.  Use bright outdoor lighting.  Clear walkways of debris and clutter, including tools and rocks.  Regularly check that handrails are securely fastened and in good repair. Both sides of any steps should have handrails.  Install guardrails along the edges of any raised decks or porches.  Have leaves, snow, and ice cleared regularly.  Use sand or salt on walkways during winter months.  In the garage, clean up any spills right away, including grease or oil spills. What can I do in the bathroom?  Use night lights.  Install grab bars by the toilet and in the tub and shower. Do not use towel bars as grab bars.  Use non-skid mats or decals on the floor of the tub or shower.  If you need to sit down while you are in the shower, use a plastic, non-slip stool.  Keep the floor dry. Immediately clean up any water that spills on the floor.  Remove soap buildup in the tub or shower on a regular basis.  Attach bath mats securely with double-sided non-slip rug tape.  Remove throw rugs and other tripping hazards from the floor. What can I do in the bedroom?  Use night lights.  Make sure that a bedside light is easy to reach.  Do not use oversized bedding that drapes onto the floor.  Have a firm chair that has side arms to use for getting dressed.  Remove throw rugs and other tripping hazards from the floor. What can I do in the kitchen?  Clean up any spills right away.  Avoid  walking on wet floors.  Place frequently used items in easy-to-reach places.  If you need to reach for something above you, use a sturdy step stool that has a grab bar.  Keep electrical cables out of the way.  Do not use floor polish or wax that makes floors slippery. If you have to use wax, make sure that it is non-skid floor wax.  Remove throw rugs and other tripping hazards from the floor. What can I do in the stairways?  Do not leave any items on the stairs.  Make sure that there are handrails on both sides of the stairs. Fix handrails that are broken or loose. Make sure that handrails are as long as the stairways.  Check any carpeting to make sure that it is firmly attached to the stairs. Fix any carpet that is loose or worn.  Avoid having throw rugs at the top or bottom of stairways, or secure the rugs with carpet tape to prevent them from moving.  Make sure that you have a light switch at the top of the stairs and the bottom of the stairs. If you do not have them, have them installed. What are some other fall prevention tips?  Wear closed-toe shoes that fit well and support your feet. Wear shoes that have  rubber soles or low heels.  When you use a stepladder, make sure that it is completely opened and that the sides are firmly locked. Have someone hold the ladder while you are using it. Do not climb a closed stepladder.  Add color or contrast paint or tape to grab bars and handrails in your home. Place contrasting color strips on the first and last steps.  Use mobility aids as needed, such as canes, walkers, scooters, and crutches.  Turn on lights if it is dark. Replace any light bulbs that burn out.  Set up furniture so that there are clear paths. Keep the furniture in the same spot.  Fix any uneven floor surfaces.  Choose a carpet design that does not hide the edge of steps of a stairway.  Be aware of any and all pets.  Review your medicines with your healthcare  provider. Some medicines can cause dizziness or changes in blood pressure, which increase your risk of falling. Talk with your health care provider about other ways that you can decrease your risk of falls. This may include working with a physical therapist or trainer to improve your strength, balance, and endurance. This information is not intended to replace advice given to you by your health care provider. Make sure you discuss any questions you have with your health care provider. Document Released: 04/01/2002 Document Revised: 09/08/2015 Document Reviewed: 05/16/2014 Elsevier Interactive Patient Education  2018 [place] Maintenance, Female Adopting a healthy lifestyle and getting preventive care can go a long way to promote health and wellness. Talk with your health care provider about what schedule of regular examinations is right for you. This is a good chance for you to check in with your provider about disease prevention and staying healthy. In between checkups, there are plenty of things you can do on your own. Experts have done a lot of research about which lifestyle changes and preventive measures are most likely to keep you healthy. Ask your health care provider for more information. Weight and diet Eat a healthy diet  Be sure to include plenty of vegetables, fruits, low-fat dairy products, and lean protein.  Do not eat a lot of foods high in solid fats, added sugars, or salt.  Get regular exercise. This is one of the most important things you can do for your health. ? Most adults should exercise for at least 150 minutes each week. The exercise should increase your heart rate and make you sweat (moderate-intensity exercise). ? Most adults should also do strengthening exercises at least twice a week. This is in addition to the moderate-intensity exercise.  Maintain a healthy weight  Body mass index (BMI) is a measurement that can be used to identify possible weight  problems. It estimates body fat based on height and weight. Your health care provider can help determine your BMI and help you achieve or maintain a healthy weight.  For females 91 years of age and older: ? A BMI below 18.5 is considered underweight. ? A BMI of 18.5 to 24.9 is normal. ? A BMI of 25 to 29.9 is considered overweight. ? A BMI of 30 and above is considered obese.  Watch levels of cholesterol and blood lipids  You should start having your blood tested for lipids and cholesterol at 76 years of age, then have this test every 5 years.  You may need to have your cholesterol levels checked more often if: ? Your lipid or cholesterol levels are high. ?  You are older than 76 years of age. ? You are at high risk for heart disease.  Cancer screening Lung Cancer  Lung cancer screening is recommended for adults 2-59 years old who are at high risk for lung cancer because of a history of smoking.  A yearly low-dose CT scan of the lungs is recommended for people who: ? Currently smoke. ? Have quit within the past 15 years. ? Have at least a 30-pack-year history of smoking. A pack year is smoking an average of one pack of cigarettes a day for 1 year.  Yearly screening should continue until it has been 15 years since you quit.  Yearly screening should stop if you develop a health problem that would prevent you from having lung cancer treatment.  Breast Cancer  Practice breast self-awareness. This means understanding how your breasts normally appear and feel.  It also means doing regular breast self-exams. Let your health care provider know about any changes, no matter how small.  If you are in your 20s or 30s, you should have a clinical breast exam (CBE) by a health care provider every 1-3 years as part of a regular health exam.  If you are 86 or older, have a CBE every year. Also consider having a breast X-ray (mammogram) every year.  If you have a family history of breast  cancer, talk to your health care provider about genetic screening.  If you are at high risk for breast cancer, talk to your health care provider about having an MRI and a mammogram every year.  Breast cancer gene (BRCA) assessment is recommended for women who have family members with BRCA-related cancers. BRCA-related cancers include: ? Breast. ? Ovarian. ? Tubal. ? Peritoneal cancers.  Results of the assessment will determine the need for genetic counseling and BRCA1 and BRCA2 testing.  Cervical Cancer Your health care provider may recommend that you be screened regularly for cancer of the pelvic organs (ovaries, uterus, and vagina). This screening involves a pelvic examination, including checking for microscopic changes to the surface of your cervix (Pap test). You may be encouraged to have this screening done every 3 years, beginning at age 51.  For women ages 59-65, health care providers may recommend pelvic exams and Pap testing every 3 years, or they may recommend the Pap and pelvic exam, combined with testing for human papilloma virus (HPV), every 5 years. Some types of HPV increase your risk of cervical cancer. Testing for HPV may also be done on women of any age with unclear Pap test results.  Other health care providers may not recommend any screening for nonpregnant women who are considered low risk for pelvic cancer and who do not have symptoms. Ask your health care provider if a screening pelvic exam is right for you.  If you have had past treatment for cervical cancer or a condition that could lead to cancer, you need Pap tests and screening for cancer for at least 20 years after your treatment. If Pap tests have been discontinued, your risk factors (such as having a new sexual partner) need to be reassessed to determine if screening should resume. Some women have medical problems that increase the chance of getting cervical cancer. In these cases, your health care provider may  recommend more frequent screening and Pap tests.  Colorectal Cancer  This type of cancer can be detected and often prevented.  Routine colorectal cancer screening usually begins at 76 years of age and continues through 76 years of  age.  Your health care provider may recommend screening at an earlier age if you have risk factors for colon cancer.  Your health care provider may also recommend using home test kits to check for hidden blood in the stool.  A small camera at the end of a tube can be used to examine your colon directly (sigmoidoscopy or colonoscopy). This is done to check for the earliest forms of colorectal cancer.  Routine screening usually begins at age 37.  Direct examination of the colon should be repeated every 5-10 years through 76 years of age. However, you may need to be screened more often if early forms of precancerous polyps or small growths are found.  Skin Cancer  Check your skin from head to toe regularly.  Tell your health care provider about any new moles or changes in moles, especially if there is a change in a mole's shape or color.  Also tell your health care provider if you have a mole that is larger than the size of a pencil eraser.  Always use sunscreen. Apply sunscreen liberally and repeatedly throughout the day.  Protect yourself by wearing long sleeves, pants, a wide-brimmed hat, and sunglasses whenever you are outside.  Heart disease, diabetes, and high blood pressure  High blood pressure causes heart disease and increases the risk of stroke. High blood pressure is more likely to develop in: ? People who have blood pressure in the high end of the normal range (130-139/85-89 mm Hg). ? People who are overweight or obese. ? People who are African American.  If you are 70-87 years of age, have your blood pressure checked every 3-5 years. If you are 60 years of age or older, have your blood pressure checked every year. You should have your blood  pressure measured twice-once when you are at a hospital or clinic, and once when you are not at a hospital or clinic. Record the average of the two measurements. To check your blood pressure when you are not at a hospital or clinic, you can use: ? An automated blood pressure machine at a pharmacy. ? A home blood pressure monitor.  If you are between 57 years and 57 years old, ask your health care provider if you should take aspirin to prevent strokes.  Have regular diabetes screenings. This involves taking a blood sample to check your fasting blood sugar level. ? If you are at a normal weight and have a low risk for diabetes, have this test once every three years after 76 years of age. ? If you are overweight and have a high risk for diabetes, consider being tested at a younger age or more often. Preventing infection Hepatitis B  If you have a higher risk for hepatitis B, you should be screened for this virus. You are considered at high risk for hepatitis B if: ? You were born in a country where hepatitis B is common. Ask your health care provider which countries are considered high risk. ? Your parents were born in a high-risk country, and you have not been immunized against hepatitis B (hepatitis B vaccine). ? You have HIV or AIDS. ? You use needles to inject street drugs. ? You live with someone who has hepatitis B. ? You have had sex with someone who has hepatitis B. ? You get hemodialysis treatment. ? You take certain medicines for conditions, including cancer, organ transplantation, and autoimmune conditions.  Hepatitis C  Blood testing is recommended for: ? Everyone born from [birth year]  through 1964. ? Anyone with known risk factors for hepatitis C.  Sexually transmitted infections (STIs)  You should be screened for sexually transmitted infections (STIs) including gonorrhea and chlamydia if: ? You are sexually active and are younger than 76 years of age. ? You are older than 76 years  of age and your health care provider tells you that you are at risk for this type of infection. ? Your sexual activity has changed since you were last screened and you are at an increased risk for chlamydia or gonorrhea. Ask your health care provider if you are at risk.  If you do not have HIV, but are at risk, it may be recommended that you take a prescription medicine daily to prevent HIV infection. This is called pre-exposure prophylaxis (PrEP). You are considered at risk if: ? You are sexually active and do not regularly use condoms or know the HIV status of your partner(s). ? You take drugs by injection. ? You are sexually active with a partner who has HIV.  Talk with your health care provider about whether you are at high risk of being infected with HIV. If you choose to begin PrEP, you should first be tested for HIV. You should then be tested every 3 months for as long as you are taking PrEP. Pregnancy  If you are premenopausal and you may become pregnant, ask your health care provider about preconception counseling.  If you may become pregnant, take 400 to 800 micrograms (mcg) of folic acid every day.  If you want to prevent pregnancy, talk to your health care provider about birth control (contraception). Osteoporosis and menopause  Osteoporosis is a disease in which the bones lose minerals and strength with aging. This can result in serious bone fractures. Your risk for osteoporosis can be identified using a bone density scan.  If you are 70 years of age or older, or if you are at risk for osteoporosis and fractures, ask your health care provider if you should be screened.  Ask your health care provider whether you should take a calcium or vitamin D supplement to lower your risk for osteoporosis.  Menopause may have certain physical symptoms and risks.  Hormone replacement therapy may reduce some of these symptoms and risks. Talk to your health care provider about whether hormone  replacement therapy is right for you. Follow these instructions at home:  Schedule regular health, dental, and eye exams.  Stay current with your immunizations.  Do not use any tobacco products including cigarettes, chewing tobacco, or electronic cigarettes.  If you are pregnant, do not drink alcohol.  If you are breastfeeding, limit how much and how often you drink alcohol.  Limit alcohol intake to no more than 1 drink per day for nonpregnant women. One drink equals 12 ounces of beer, 5 ounces of wine, or 1 ounces of hard liquor.  Do not use street drugs.  Do not share needles.  Ask your health care provider for help if you need support or information about quitting drugs.  Tell your health care provider if you often feel depressed.  Tell your health care provider if you have ever been abused or do not feel safe at home. This information is not intended to replace advice given to you by your health care provider. Make sure you discuss any questions you have with your health care provider. Document Released: 10/25/2010 Document Revised: 09/17/2015 Document Reviewed: 01/13/2015 Elsevier Interactive Patient Education  [employer].

## 2017-10-23 ENCOUNTER — Other Ambulatory Visit: Payer: Self-pay | Admitting: Family Medicine

## 2017-10-23 DIAGNOSIS — D509 Iron deficiency anemia, unspecified: Secondary | ICD-10-CM

## 2017-10-25 ENCOUNTER — Telehealth: Payer: Self-pay

## 2017-10-25 NOTE — Telephone Encounter (Signed)
Dr Regis Bill noted osteoporosis Noted hx of actonel and does not want to take meds at this time Wynetta Fines RN

## 2017-10-25 NOTE — Telephone Encounter (Signed)
Follow up to bone density Awaiting fax of dexa from [facility]

## 2017-10-30 ENCOUNTER — Encounter: Payer: Self-pay | Admitting: Internal Medicine

## 2017-11-02 ENCOUNTER — Other Ambulatory Visit: Payer: Self-pay | Admitting: Adult Health

## 2017-11-02 DIAGNOSIS — I10 Essential (primary) hypertension: Secondary | ICD-10-CM

## 2018-02-12 ENCOUNTER — Ambulatory Visit (INDEPENDENT_AMBULATORY_CARE_PROVIDER_SITE_OTHER): Payer: Medicare Other | Admitting: Obstetrics & Gynecology

## 2018-02-12 ENCOUNTER — Telehealth: Payer: Self-pay | Admitting: *Deleted

## 2018-02-12 ENCOUNTER — Encounter: Payer: Self-pay | Admitting: Obstetrics & Gynecology

## 2018-02-12 VITALS — BP 140/86

## 2018-02-12 DIAGNOSIS — N393 Stress incontinence (female) (male): Secondary | ICD-10-CM | POA: Diagnosis not present

## 2018-02-12 DIAGNOSIS — Z4689 Encounter for fitting and adjustment of other specified devices: Secondary | ICD-10-CM

## 2018-02-12 DIAGNOSIS — R300 Dysuria: Secondary | ICD-10-CM | POA: Diagnosis not present

## 2018-02-12 MED ORDER — FLUCONAZOLE 150 MG PO TABS: 150.0000 mg | ORAL_TABLET | Freq: Once | ORAL | 0 refills | Status: AC

## 2018-02-12 MED ORDER — CIPROFLOXACIN HCL 500 MG PO TABS: 500.0000 mg | ORAL_TABLET | Freq: Two times a day (BID) | ORAL | 0 refills | Status: AC

## 2018-02-12 NOTE — Progress Notes (Signed)
    Belinda Harper 08-30-1940 [id number]        77 y.o.  [id number]   RP: UTI Sx and pessary check  HPI: Has Milex ring #6 with support.  No vaginal discharge, no bleeding.  No pelvic pain.  C/O dysuria, urgency and frequency of urination.  No fever.  Still having SUI.  Difficulty doing the Kegel exercises, not sure she is doing them right.   OB History  Gravida Para Term Preterm AB Living  3 2     1 2   SAB TAB Ectopic Multiple Live Births  1            # Outcome Date GA Lbr Len/2nd Weight Sex Delivery Anes PTL Lv  3 SAB           2 Para           1 Para             Past medical history,surgical history, problem list, medications, allergies, family history and social history were all reviewed and documented in the EPIC chart.   Directed ROS with pertinent positives and negatives documented in the history of present illness/assessment and plan.  Exam:  There were no vitals filed for this visit. General appearance:  Normal  CVAT negative bilaterally  Abdomen: Normal  Gynecologic exam: Vulva normal.  Pessary removed, cleaned.  Mild discharge.  Vaginal mucosa intact.  Pessary put back in place.   Assessment/Plan:  77 y.o. [id number]   1. Dysuria Probable acute cystitis.  Allergies to sulfa.  Will treat with ciprofloxacin 500 mg 1 tablet twice a day for 7 days.  Usage reviewed with patient and prescription sent to pharmacy.  We will send a prescription of fluconazole for prevention or treatment of yeast vaginitis post antibiotics.  Pending urine culture. - Urinalysis,Complete w/RFL Culture  2. Pessary maintenance Well with Milex pessary ring #6 with support.  Good fit and no irritation of the vaginal mucosa.  We will follow-up in 4 months for pessary maintenance.  We will also do her annual gynecologic exam at that time.  3. SUI (stress urinary incontinence, female) Difficulty doing Kegel exercises, not certain she is doing them right.  Decision to refer to physical therapy for  pelvic floor reinforcement.  Other orders - ciprofloxacin (CIPRO) 500 MG tablet; Take 1 tablet (500 mg total) by mouth 2 (two) times daily for 7 days. - fluconazole (DIFLUCAN) 150 MG tablet; Take 1 tablet (150 mg total) by mouth once for 1 dose.  Counseling on above issues and coordination of care more than 50% for 25 minutes.  [name] MD, 10:41 AM 02/12/2018

## 2018-02-12 NOTE — Telephone Encounter (Signed)
-----   Message from [name], MD sent at 02/12/2018 11:36 AM EDT ----- Regarding: Refer to Physical therapy for Pelvic Floor reinforcement SUI.  Pessary.

## 2018-02-12 NOTE — Telephone Encounter (Signed)
Referral placed at [facility] PT location they will call to schedule.

## 2018-02-12 NOTE — Patient Instructions (Signed)
1. Dysuria Probable acute cystitis.  Allergies to sulfa.  Will treat with ciprofloxacin 500 mg 1 tablet twice a day for 7 days.  Usage reviewed with patient and prescription sent to pharmacy.  We will send a prescription of fluconazole for prevention or treatment of yeast vaginitis post antibiotics.  Pending urine culture. - Urinalysis,Complete w/RFL Culture  2. Pessary maintenance Well with Milex pessary ring #6 with support.  Good fit and no irritation of the vaginal mucosa.  We will follow-up in 4 months for pessary maintenance.  We will also do her annual gynecologic exam at that time.  3. SUI (stress urinary incontinence, female) Difficulty doing Kegel exercises, not certain she is doing them right.  Decision to refer to physical therapy for pelvic floor reinforcement.  Other orders - ciprofloxacin (CIPRO) 500 MG tablet; Take 1 tablet (500 mg total) by mouth 2 (two) times daily for 7 days. - fluconazole (DIFLUCAN) 150 MG tablet; Take 1 tablet (150 mg total) by mouth once for 1 dose.  Belinda Harper, it was a pleasure seeing you today!  I will inform you of your results as soon as they are available.

## 2018-02-14 LAB — URINALYSIS, COMPLETE W/RFL CULTURE
Bilirubin Urine: NEGATIVE
Glucose, UA: NEGATIVE
Hyaline Cast: NONE SEEN /LPF
Ketones, ur: NEGATIVE
Nitrites, Initial: NEGATIVE
Specific Gravity, Urine: 1.02 (ref 1.001–1.03)
pH: 8.5 — ABNORMAL HIGH (ref 5.0–8.0)

## 2018-02-14 LAB — URINE CULTURE
MICRO NUMBER:: 91268182
SPECIMEN QUALITY:: ADEQUATE

## 2018-02-14 LAB — CULTURE INDICATED

## 2018-02-15 ENCOUNTER — Encounter: Payer: Self-pay | Admitting: *Deleted

## 2018-02-16 NOTE — Telephone Encounter (Signed)
patient scheduled on 03/14/18 @ 2:45pm

## 2018-03-07 ENCOUNTER — Ambulatory Visit: Payer: Medicare Other | Admitting: Obstetrics & Gynecology

## 2018-03-12 ENCOUNTER — Ambulatory Visit: Payer: Medicare Other | Admitting: Obstetrics & Gynecology

## 2018-03-14 ENCOUNTER — Other Ambulatory Visit: Payer: Self-pay

## 2018-03-14 ENCOUNTER — Ambulatory Visit: Payer: Medicare Other | Attending: Obstetrics & Gynecology | Admitting: Physical Therapy

## 2018-03-14 ENCOUNTER — Encounter: Payer: Self-pay | Admitting: Physical Therapy

## 2018-03-14 DIAGNOSIS — R278 Other lack of coordination: Secondary | ICD-10-CM | POA: Diagnosis present

## 2018-03-14 DIAGNOSIS — M6281 Muscle weakness (generalized): Secondary | ICD-10-CM

## 2018-03-14 NOTE — Patient Instructions (Addendum)
About Pelvic Support Problems Pelvic Support Problems Explained Ligaments, muscles, and connective tissue normally hold your bladder, uterus, and other organs in their proper places in your pelvis. When these tissues become weak, a problem with pelvic support may result. Weak support can cause one or more of the pelvic organs to drop down into the vagina. An organ may even drop so far that is partially exposed outside the body.  Pelvic support problems are named by the change in the organ. The main types of pelvic support problems are:  . Cystocele: When the bladder drops down into your vagina.  . Enterocele: When your small intestine drops between your vagina and rectum.  . Rectocele: When your rectum bulges into the vaginal wall.  [name] Uterine prolapse: When your uterus drops into your vagina.  . Vaginal prolapse: When the top part of the vagina begins to droop. This sometimes happens after a hysterectomy (removal of the uterus).  Causes Pelvic support problems can be caused by many conditions. They may begin after you give birth, especially if you had a large baby. During childbirth, the muscles and skin of the birth canal (vagina) are stretched and sometimes torn. They heal over time but are not always exactly the same. A long pushing stage of labor may also weaken these tissues as well as very rapid births as the tissues do not have time to stretch so they tear.  Also, after menopause, there are changes in the vaginal walls resulting from a decrease in estrogen. Estrogen helps to keep the tissues toned. Low levels of estrogen weaken the vaginal walls and may cause the bladder to shift from its normal position. As women get older, the loss of muscle tone and the relaxation of muscles may cause the uterus or other organs to drop.  Over time, conditions like chronic coughing, chronic constipation, doing a lot of heavy lifting, straining to pass stool, and obesity, can also weaken the pelvic support  muscles.  Diagnosing Pelvic Support Problems Your health care provider will ask about your symptoms and do a pelvic examination. Your provider may also do a rectal exam during your pelvic exam. Your provider may ask you to: 1. Bear down and push (like you are having a bowel movement) so he or she can see if your bladder or other part of your body protrudes into the vagina. 2. Contract the muscles of your pelvis to check the strength of your pelvic muscles.  3. Do several types of urine, nerve and muscle tests of the pelvis and around the bladder to see what type of treatment is best for you.   Symptoms Symptoms of pelvic support problems depend on the organ involved, but may include:  . urine leakage  . stain or fecal loss after a bowel movement . trouble having bowel movements  . ache in the lower abdomen, groin, or lower back  . bladder infection  . a feeling of heaviness, pulling, or fullness in the pelvis, or a feeling that something is falling out of the vagina  . an organ protruding from your vaginal opening  . feeling the need to support the organs or perineal area to empty bladder or bowels . painful sexual intercourse.  Many women feel pelvic pressure or trouble holding their urine immediately after childbirth. For some, these symptoms go away permanently, in others they return as they get older.  Treatment Options A prolapsed organ cannot repair itself. Contact your health care provider as soon as you notice symptoms  of a problem. Treatment depends on what the specific problem is and how far advanced it is.  . The symptoms caused by some pelvic support problems may simply be treated with changes in diet, medicine to soften the stool, weight loss, or avoiding strenuous activities. You may also do pelvic floor exercises to help strengthen your pelvic muscles.  . Some cases of prolapse may require a special support device made from plastic or rubber called a pessary that fits into the  vagina to support the uterus, vagina, or bladder. A pessary can also help women who leak urine when coughing, straining, or exercising. In mild cases, a tampon or vaginal diaphragm may be used instead of a pessary.  Talk to your doctor or health care provider about these options. . In serious cases, surgery may be needed to put the organs back into their proper place. The uterus may be removed because of the pressure it puts on the bladder.  Your doctor will know what surgery will be best for you. How can I prevent pelvic support problems?  You can help prevent pelvic support problems by:  . maintaining a healthy lifestyle  . continuing to do pelvic floor exercises after you deliver a baby  . maintaining a healthy weight  . avoiding a lot of heavy lifting and lifting with your legs (not from your waist)  . treating constipation and avoid getting constipated by eating high fiber foods.      Quick Contraction: Gravity Assisted (Elevated Hips)    Lie with thick pillow under hips so pelvis is tilted upward. Quickly squeeze then fully relax pelvic floor. Perform __1_ sets of __5_. Rest for __1_ seconds between sets. Do _3__ times a day.   Copyright  VHI. All rights reserved.  Slow Contraction: Gravity Assisted (Elevated Hips)    Lie with thick pillow under hips so pelvis is tilted upward. Slowly squeeze pelvic floor for _10__ seconds. Rest for _10__ seconds. Repeat _10__ times. Do __3_ times a day.   Copyright  VHI. All rights reserved.  Rectal prolapse  [facility] [address], [place], [place] [address] Phone # [phone] Fax [phone]

## 2018-03-14 NOTE — Therapy (Signed)
[facility] [address], [place], [place], [address] Phone: [phone]   Fax:  [phone]  Physical Therapy Evaluation  Patient Details  Name: Belinda Harper MRN: [id number] Date of Birth: [birth year] Referring Provider (PT): dr. Cala Bradford   Encounter Date: 03/14/2018  PT End of Session - 03/14/18 1509    Visit Number  1    Date for PT Re-Evaluation  05/09/18    Authorization Type  [employer] medicare    PT Start Time  6962    PT Stop Time  1525    PT Time Calculation (min)  40 min    Activity Tolerance  Patient tolerated treatment well    Behavior During Therapy  [facility] for tasks assessed/performed       Past Medical History:  Diagnosis Date  . 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 ALLERGIC RHINITIS 01/03/2007  . 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 Anxiety   . ASTHMA 01/03/2007  . Blood transfusion 12 2010   hg 4 transfused 4 units  . DEPRESSION 01/03/2007   resolved  . Family history of adverse reaction to anesthesia    pt's mother as she gets older organs shut down   . FRACTURE, ANKLE, LEFT 04/10/2009  . Gastritis 2002  . GERD (gastroesophageal reflux disease)   . HEMORRHOIDS 05/19/2008  . HYPERCHOLESTEROLEMIA 05/19/2008  . Normal cardiac stress test    2014  . OSTEOARTHRITIS 01/03/2007  . OSTEOPOROSIS 01/03/2007   hx of actonbel use for at least 5 years   . Sepsis due to cellulitis ([place]) 02/05/2015  . Unspecified vitamin D deficiency 04/06/2007  . UTERINE PROLAPSE 04/06/2007    Past Surgical History:  Procedure Laterality Date  . ANKLE FRACTURE SURGERY  02/2009   left ankle fracture and dislocation  . CATARACT EXTRACTION Left    last year   . CATARACT EXTRACTION W/ INTRAOCULAR LENS IMPLANT  2000   right eye  . 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 ORIF FINGER / THUMB FRACTURE  1970   reattached  . PANENDOSCOPY     small bowel bx    There were no vitals filed for this visit.   Subjective Assessment - 03/14/18 1450    Subjective  Patient reports she has had urinary leakage for the past several years. The past couple of months she has to wear a pad during the day and at night. Patient gets up 2-3 times per  night. Patient reports she has to strain sometimes to have a bowel movement.     Patient Stated Goals  reduce urinary leakage    Currently in Pain?  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 Cala Bradford    Onset Date/Surgical Date  10/23/17    Prior Therapy  none      Precautions   Precautions  Other (comment)    Precaution Comments  osteoporosis  Restrictions   Weight Bearing Restrictions  No      Balance Screen   Has the patient fallen in the past 6 months  No    Has the patient had a decrease in activity level because of a fear of falling?   Yes    Is the patient reluctant to leave their home because of a fear of falling?   No      Home Film/video editor residence      Prior Function   Level of Independence  Independent    Vocation  Retired    Dietitian   Overall Cognitive Status  Within Functional Limits for tasks assessed      Posture/Postural Control   Posture/Postural Control  Postural limitations    Postural Limitations  Rounded Shoulders;Forward head      ROM / Strength   AROM / PROM / Strength  AROM;PROM;Strength      AROM   Lumbar Extension  decreased by  50%    Lumbar - Right Side Bend  decreased by 25%    Lumbar - Left Side Bend  decreased by 25%      Strength   Right Hip Extension  4/5    Right Hip ABduction  3-/5    Left Hip Extension  4/5    Left Hip ABduction  4-/5      Standardized Balance Assessment   Standardized Balance Assessment  Five Times Sit to Stand    Five times sit to stand comments   17 seconds                Objective measurements completed on examination: See above findings.    Pelvic Floor Special Questions - 03/14/18 0001    Urinary Leakage  Yes    How often  does not feel the leaking urine    Pad use  during the day she will use a Pose #2, when goes to bed or at home or a light day (3)    Activities that cause leaking  Coughing;Sneezing;With strong urge   just happens   Urinary urgency  --   pessary increases the urine leakage   Fecal incontinence  No    Falling out feeling (prolapse)  Yes    Skin Integrity  Intact    Prolapse  Uterine;Other    Prolapse other  rectal prolapse    Pelvic Floor Internal Exam  Patient confirms identification and approves PT to assess pelvic floor and treatment    Exam Type  Vaginal    Palpation  Patient needed minimal verbal cues to not cotract the gluteals, therapist had to     Strength  weak squeeze, no lift    Strength # of reps  10    Strength # of seconds  10    Tone  decreased               PT Education - 03/14/18 1525    Education Details  pelvic floor contraction; information on pelvic floor prolapse    Person(s) Educated  Patient    Methods  Explanation;Demonstration;Verbal cues;Handout    Comprehension  Returned demonstration;Verbalized understanding       PT Short Term Goals - 03/14/18 1545      PT SHORT TERM GOAL #1   Title  independent with initial HEP    Time  4    Period  Weeks    Status  New    Target Date  04/11/18      PT SHORT TERM GOAL #2   Title  understand correct toileting technique to reduce strain on pelvic floor      Time  4    Period  Weeks    Status  New    Target Date  04/11/18      PT SHORT TERM GOAL #3   Title  understand how to perform daily activities with correct breathing to not strain the pelvic floor    Time  4    Period  Weeks    Status  New    Target Date  04/11/18        PT Long Term Goals - 03/14/18 1543      PT LONG TERM GOAL #1   Title  independent with  HEP and understand how to progress herself    Time  8    Period  Weeks    Status  New    Target Date  05/09/18      PT LONG TERM GOAL #2   Title  understand timed voiding to reduce urinary leakage     Time  8    Period  Weeks    Status  New    Target Date  05/09/18      PT LONG TERM GOAL #3   Title  understand ways to manage her urterine and rectal prolapse     Time  8    Period  Weeks    Status  New    Target Date  05/09/18      PT LONG TERM GOAL #4   Title  urinary leakage during the day decreased >/= 50% due to increased pelvic floor strength >/= 3/5    Time  8    Period  Weeks    Status  New    Target Date  05/09/18      PT LONG TERM GOAL #5   Title  sit to stand </= 13 seconds reducing her risk of falls    Time  8    Period  Weeks    Status  New    Target Date  05/09/18             Plan - 03/14/18 1531    Clinical Impression Statement  Patient is a 76 year old female with uterine prolapase and urinary leakage for several years. Patient reports the last 6 months she is having to wear a pad day and night. Patient reports no pain.  Patient wears a Pose pad when she goes out. Patient will wear a thin pad  when at home and at night.  Patient is not sure when she leaks urine due to not feeling it and sometimes will leak urine on the way to the bathroom. pelvic floor strength is 2/5 with minimal verbal cues to not contract the gluteals and hold for 10 seconds. Patient presents with a rectal prolapse that she thought was her hemorroid. Patient reports sometimes the pad has a dark yellow/brown on the  pad. Patient sit to stand 5 times is 17 seconds but should be 13 seconds puttong her at risk of falls.  Patient reports she feel unsteady on unlevel surfaces. Patient will benefit from skilled therapy to improve her strength for balance and pelvic floor to reduce urinary leakage.     History and Personal Factors relevant to plan of care:  osteoporosis, Uterine prolaps; rectal prolapse    Clinical Presentation  Evolving    Clinical Presentation due to:  leakage is getting worse that she has to wear a pad and not feeling the leakage    Rehab Potential  Excellent    Clinical Impairments Affecting Rehab Potential  osteoporosis, Uterine prolaps; rectal prolapse    PT Frequency  1x / week    PT Duration  8 weeks    PT Treatment/Interventions  Biofeedback;Therapeutic exercise;Balance training;Therapeutic activities;Neuromuscular re-education;Patient/family education;Manual techniques    PT Next Visit Plan  education on toileting, abdominal massage, pelvic floor contraction, hip strength, balance    Consulted and Agree with Plan of Care  Patient       Patient will benefit from skilled therapeutic intervention in order to improve the following deficits and impairments:  Increased fascial restricitons, Decreased coordination, Impaired tone, Decreased activity tolerance, Decreased endurance, Decreased strength  Visit Diagnosis: Muscle weakness (generalized)  Other lack of coordination     Problem List Patient Active Problem List   Diagnosis Date Noted  . Abnormal LFTs new 10/01/2014  . Other hemorrhoids 12/19/2013  . Pelvic prolapse 12/19/2013  . Medicare annual wellness visit, initial 12/18/2013  . Vertigo, peripheral 11/21/2012  . Elevated blood pressure reading 11/21/2012  . Dyspnea 10/11/2012  . Chest pain 09/19/2012  . Decreased exercise tolerance 08/21/2012  . Nodule of finger 08/21/2012  . Hx of extrinsic asthma 08/21/2012  . Leukopenia 07/24/2012  . Anemia, normocytic normochromic  07/24/2012  . 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  . Unspecified deficiency anemia 10/04/2011  . Ankle pain 08/29/2011  . Abnormal gait 08/29/2011  . Numbness of foot 08/29/2011  . Leg length discrepancy 08/07/2011  . Scoliosis 08/07/2011  . Hx of fall 08/07/2011  . Medicare annual wellness visit, subsequent 08/07/2011  . Hematuria 02/23/2011  . Rectal prolapse 07/03/2010  . Visit for preventive health examination 06/28/2010  . MALAISE AND FATIGUE 01/27/2010  . NUMBNESS 01/27/2010  . FRACTURE, ANKLE, LEFT 04/10/2009  . HYPERCHOLESTEROLEMIA 05/19/2008  . 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 HEMORRHOIDS 05/19/2008  . UNSPECIFIED VITAMIN D DEFICIENCY 04/06/2007  . Uterine prolapse 04/06/2007  . Iron deficiency anemia 01/03/2007  . DEPRESSION 01/03/2007  . ALLERGIC RHINITIS 01/03/2007  . ASTHMA 01/03/2007  . OSTEOARTHRITIS 01/03/2007  . Osteoporosis 01/03/2007    Earlie Counts, PT 03/14/18 3:49 PM   [facility] [address] [place], [place], [address] Phone: [phone]   Fax:  [phone]  Name: ODESSIA ASLESON MRN: [id number] Date of Birth: 27-Apr-1940

## 2018-03-28 ENCOUNTER — Ambulatory Visit: Payer: Medicare Other | Attending: Obstetrics & Gynecology | Admitting: Physical Therapy

## 2018-03-28 ENCOUNTER — Encounter: Payer: Self-pay | Admitting: Physical Therapy

## 2018-03-28 DIAGNOSIS — M6281 Muscle weakness (generalized): Secondary | ICD-10-CM | POA: Insufficient documentation

## 2018-03-28 DIAGNOSIS — R278 Other lack of coordination: Secondary | ICD-10-CM

## 2018-03-28 NOTE — Patient Instructions (Signed)
Access Code: [email or web address]  URL: [email or web address]  Date: 03/28/2018  Prepared by: Earlie Counts   Exercises  Supine Quadriceps Stretch with Strap on Table - 2 reps - 1 sets - 30 sec hold - 1x daily - 7x weekly  Hip Adductors and Hamstring Stretch with Strap - 1 reps - 1 sets - 30 sec hold - 1x daily - 7x weekly  Hooklying Hamstring Stretch with Strap - 1 reps - 1 sets - 30 sec hold - 1x daily - 7x weekly  Beginner Bridge - 15 reps - 1x daily - 7x weekly  Supine Hip Adduction Isometric with Ball - 10 reps - 1 sets - 5 sec hold - 1x daily - 7x weekly  [facility] [address] [place], [place] [address] Phone # [phone] Fax [phone]

## 2018-03-28 NOTE — Therapy (Signed)
[facility] [address] [place], [place], [address] Phone: [phone]   Fax:  [phone]  Physical Therapy Treatment  Patient Details  Name: Belinda Harper MRN: [id number] Date of Birth: [birth year] Referring Provider (PT): dr. Cala Bradford   Encounter Date: 03/28/2018  PT End of Session - 03/28/18 0807    Visit Number  2    Date for PT Re-Evaluation  05/09/18    Authorization Type  [employer] medicare    PT Start Time  0800    PT Stop Time  0840    PT Time Calculation (min)  40 min    Activity Tolerance  Patient tolerated treatment well    Behavior During Therapy  [facility] for tasks assessed/performed       Past Medical History:  Diagnosis Date  . 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 ALLERGIC RHINITIS 01/03/2007  . 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 Anxiety   . ASTHMA 01/03/2007  . Blood transfusion 12 2010   hg 4 transfused 4 units  . DEPRESSION 01/03/2007   resolved  . Family history of adverse reaction to anesthesia    pt's mother as she gets older organs shut down   . FRACTURE, ANKLE, LEFT 04/10/2009  . Gastritis 2002  . GERD (gastroesophageal reflux disease)   . HEMORRHOIDS 05/19/2008  . HYPERCHOLESTEROLEMIA 05/19/2008  . Normal cardiac stress test    2014  . OSTEOARTHRITIS 01/03/2007  . OSTEOPOROSIS 01/03/2007   hx of actonbel use for at least 5 years   . Sepsis due to cellulitis ([place]) 02/05/2015  . Unspecified vitamin D deficiency 04/06/2007  . UTERINE PROLAPSE 04/06/2007    Past Surgical History:  Procedure Laterality Date  . ANKLE FRACTURE SURGERY  02/2009   left ankle fracture and dislocation  . CATARACT EXTRACTION Left    last year   . CATARACT EXTRACTION W/ INTRAOCULAR LENS IMPLANT  2000   right eye  . 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 ORIF FINGER / THUMB FRACTURE  1970   reattached  . PANENDOSCOPY     small bowel bx    There were no vitals filed for this visit.  Subjective Assessment - 03/28/18 0800    Subjective  I have trouble laying my hips on the pillow due to left hip pain.     Patient Stated Goals  reduce urinary leakage    Currently in Pain?  Yes    Pain Score  4     Pain Location  Hip    Pain Orientation  Left    Pain Descriptors / Indicators  Aching    Pain Type  Chronic pain    Pain Radiating Towards  down to the knee    Pain Onset  More than a month ago    Pain Frequency  Constant    Aggravating Factors   first thing in the morning    Pain Relieving Factors  rest    Multiple Pain Sites  No         [facility] PT Assessment - 03/28/18 0001      Palpation  SI assessment   left ilium is higher than the right and possible posteriorly rotated    Palpation comment  tightness in the left hip adductor      Special Tests    Special Tests  Hip Special Tests    Hip Special Tests   Saralyn Pilar [facility]) Test      Saralyn Pilar [facility]) Test   Findings  Positive    Side  Left    Comments  pain                   [facility] Adult PT Treatment/Exercise - 03/28/18 0001      Exercises   Exercises  Lumbar      Lumbar Exercises: Stretches   Quad Stretch  Left;2 reps;30 seconds   supine   Other Lumbar Stretch Exercise  manual stretch of the left hip adductor      Lumbar Exercises: Supine   Bridge  15 reps;1 second   assistance to get full extension of hips   Other Supine Lumbar Exercises  ball squeeze hold 5 sec for 10x      Manual Therapy   Manual Therapy  Soft tissue mobilization    Soft tissue mobilization  left hip adductors, left gluteal              PT Education - 03/28/18 0839    Education Details  Access Code: [email or web address]    Person(s) Educated  Patient    Methods  Explanation;Demonstration;Verbal cues;Handout    Comprehension  Returned demonstration;Verbalized understanding       PT Short Term Goals - 03/28/18 0844      PT SHORT TERM GOAL #1   Title  independent with initial HEP    Time  4    Period  Weeks    Status  On-going      PT SHORT TERM GOAL #2   Title  understand correct toileting technique to reduce strain on pelvic floor     Time  4    Period  Weeks    Status  On-going      PT SHORT TERM GOAL #3   Title  understand how to perform daily activities with correct breathing to not strain the pelvic floor    Time  4    Period  Weeks    Status  On-going        PT Long Term Goals - 03/14/18 1543      PT LONG TERM GOAL #1   Title  independent with  HEP and understand how to progress herself    Time  8    Period  Weeks    Status  New    Target Date  05/09/18      PT LONG TERM GOAL #2   Title  understand timed voiding to reduce urinary leakage     Time  8    Period  Weeks    Status  New    Target Date  05/09/18      PT LONG TERM GOAL #3   Title  understand ways to manage her urterine and rectal prolapse     Time  8    Period  Weeks    Status  New    Target Date  05/09/18      PT LONG TERM GOAL #4   Title  urinary leakage during the day decreased >/= 50% due to increased pelvic floor strength >/= 3/5    Time  8    Period  Weeks    Status  New    Target Date  05/09/18      PT LONG TERM GOAL #5   Title  sit to stand </= 13 seconds reducing her risk of falls    Time  8    Period  Weeks    Status  New    Target Date  05/09/18            Plan - 03/28/18 0839    Clinical Impression Statement  After manual work the left hip felt relax. Patient has tight hip adductores, quadriceps and hamstring. Patietn left ilium is higher than the right. Patient is unable to isloate the pelvic  floor muscles. Patient reports she has a white greenish discharge from the vaginal area  and will tell the doctor next week. Patient will benefit from skilled therapy to imporve her strength for balance and pelvic floor to reduce urinary leakage.     Rehab Potential  Excellent    Clinical Impairments Affecting Rehab Potential  osteoporosis, Uterine prolaps; rectal prolapse    PT Frequency  1x / week    PT Duration  8 weeks    PT Treatment/Interventions  Biofeedback;Therapeutic exercise;Balance training;Therapeutic activities;Neuromuscular re-education;Patient/family education;Manual techniques    PT Next Visit Plan  education on toileting, abdominal massage, pelvic floor contraction with EMG;  hip strength, balance    PT Home Exercise Plan  Access Code: [email or web address]    Recommended Other Services  MD signed initial note    Consulted and Agree with Plan of Care  Patient       Patient will benefit from skilled therapeutic intervention in order to improve the following deficits and impairments:  Increased fascial restricitons, Decreased coordination, Impaired tone, Decreased activity tolerance, Decreased endurance, Decreased strength  Visit Diagnosis: Muscle weakness (generalized)  Other lack of coordination     Problem List Patient Active Problem List   Diagnosis Date Noted  . Abnormal LFTs new 10/01/2014  . Other hemorrhoids 12/19/2013  . Pelvic prolapse 12/19/2013  . Medicare annual wellness visit, initial 12/18/2013  . Vertigo, peripheral 11/21/2012  . Elevated blood pressure reading 11/21/2012  . Dyspnea 10/11/2012  . Chest pain 09/19/2012  . Decreased exercise tolerance 08/21/2012  . Nodule of finger 08/21/2012  . Hx of extrinsic asthma 08/21/2012  . Leukopenia 07/24/2012  . Anemia, normocytic normochromic 07/24/2012  . 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  . Unspecified deficiency anemia 10/04/2011  . Ankle pain 08/29/2011  . Abnormal gait 08/29/2011  . Numbness of foot  08/29/2011  . Leg length discrepancy 08/07/2011  . Scoliosis 08/07/2011  . Hx of fall 08/07/2011  . Medicare annual wellness visit, subsequent 08/07/2011  . Hematuria 02/23/2011  . Rectal prolapse 07/03/2010  . Visit for preventive health examination 06/28/2010  . MALAISE AND FATIGUE 01/27/2010  . NUMBNESS 01/27/2010  . FRACTURE, ANKLE, LEFT 04/10/2009  . HYPERCHOLESTEROLEMIA 05/19/2008  . 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 HEMORRHOIDS 05/19/2008  . UNSPECIFIED VITAMIN D DEFICIENCY 04/06/2007  . Uterine prolapse 04/06/2007  . Iron deficiency anemia 01/03/2007  . DEPRESSION 01/03/2007  . ALLERGIC RHINITIS 01/03/2007  . ASTHMA 01/03/2007  . OSTEOARTHRITIS 01/03/2007  . Osteoporosis 01/03/2007    Earlie Counts, PT 03/28/18 8:46 AM   [facility] [address], [place], [place], [address] Phone: [phone]   Fax:  [phone]  Name: Belinda Harper MRN: [id number] Date of Birth: 02-27-1941

## 2018-03-30 ENCOUNTER — Encounter

## 2018-04-04 ENCOUNTER — Encounter: Payer: Medicare Other | Admitting: Physical Therapy

## 2018-04-06 ENCOUNTER — Ambulatory Visit: Payer: Medicare Other | Admitting: Physical Therapy

## 2018-04-11 ENCOUNTER — Ambulatory Visit: Payer: Medicare Other | Admitting: Physical Therapy

## 2018-04-11 ENCOUNTER — Encounter: Payer: Self-pay | Admitting: Physical Therapy

## 2018-04-11 DIAGNOSIS — M6281 Muscle weakness (generalized): Secondary | ICD-10-CM

## 2018-04-11 DIAGNOSIS — R278 Other lack of coordination: Secondary | ICD-10-CM

## 2018-04-11 NOTE — Patient Instructions (Signed)
Access Code: [email or web address]  URL: [email or web address]  Date: 04/11/2018  Prepared by: Earlie Counts   Exercises  Supine Quadriceps Stretch with Strap on Table - 2 reps - 1 sets - 30 sec hold - 1x daily - 7x weekly  Hip Adductors and Hamstring Stretch with Strap - 1 reps - 1 sets - 30 sec hold - 1x daily - 7x weekly  Hooklying Hamstring Stretch with Strap - 1 reps - 1 sets - 30 sec hold - 1x daily - 7x weekly  Beginner Bridge - 15 reps - 1x daily - 7x weekly  Supine Hip Adduction Isometric with Ball - 10 reps - 1 sets - 5 sec hold - 1x daily - 7x weekly  Bent Knee Fallouts - 10 reps - 1 sets - 1x daily - 7x weekly  Supine Bent Leg Lift with Ankle Dorsiflexion - 10 reps - 1 sets - 1x daily - 7x weekly  Supine Alternating Shoulder Flexion - 10 reps - 1 sets - 1x daily - 7x weekly  [facility] [address], [place], [place] [address] Phone # [phone] Fax [phone]

## 2018-04-11 NOTE — Therapy (Signed)
[facility] [address], [place], [place], [address] Phone: [phone]   Fax:  [phone]  Physical Therapy Treatment  Patient Details  Name: Belinda Harper MRN: [id number] Date of Birth: May 06, 1940 Referring Provider (PT): dr. Cala Bradford   Encounter Date: 04/11/2018  PT End of Session - 04/11/18 1608    Visit Number  3    Date for PT Re-Evaluation  05/09/18    Authorization Type  [employer] medicare    PT Start Time  5053    PT Stop Time  1610    PT Time Calculation (min)  40 min    Activity Tolerance  Patient tolerated treatment well;No increased pain    Behavior During Therapy  W[facility] for tasks assessed/performed       Past Medical History:  Diagnosis Date  . 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 ALLERGIC RHINITIS 01/03/2007  . 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 Anxiety   . ASTHMA 01/03/2007  . Blood transfusion 12 2010   hg 4 transfused 4 units  . DEPRESSION 01/03/2007   resolved  . Family history of adverse reaction to anesthesia    pt's mother as she gets older organs shut down   . FRACTURE, ANKLE, LEFT 04/10/2009  . Gastritis 2002  . GERD (gastroesophageal reflux disease)   . HEMORRHOIDS 05/19/2008  . HYPERCHOLESTEROLEMIA 05/19/2008  . Normal cardiac stress test    2014  . OSTEOARTHRITIS 01/03/2007  . OSTEOPOROSIS 01/03/2007   hx of actonbel use for at least 5 years   . Sepsis due to cellulitis ([place]) 02/05/2015  . Unspecified vitamin D deficiency 04/06/2007  . UTERINE PROLAPSE 04/06/2007    Past Surgical History:  Procedure Laterality Date  . ANKLE FRACTURE SURGERY  02/2009   left ankle fracture and dislocation  . CATARACT EXTRACTION Left    last year   . CATARACT EXTRACTION W/ INTRAOCULAR LENS IMPLANT  2000   right eye  . 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 ORIF FINGER / THUMB FRACTURE  1970   reattached  . PANENDOSCOPY     small bowel bx    There were no vitals filed for this visit.  Subjective Assessment - 04/11/18 1541    Subjective  Leakage is not better. I am not sure it is urine that I am leaking and if it is urine.     Patient Stated Goals  reduce urinary leakage    Currently in Pain?  No/denies    Multiple Pain Sites  No                       [facility] Adult PT Treatment/Exercise - 04/11/18 0001      Lumbar Exercises: Supine   Clam  20 reps;1 second   with pelvic floor contraction   Bent Knee Raise  20 reps;1 second   with pelvic floor contraction   Bridge  15 reps;1 second   assistance to get full extension of hips   Bridge Limitations  ball squeeze monitoring for cramps    Other Supine Lumbar Exercises  hookly pelvic  floor contraction alternate shoulder flexion             PT Education - 04/11/18 1607    Education Details  Access Code: [email or web address]     Person(s) Educated  Patient    Methods  Explanation;Demonstration;Verbal cues;Handout    Comprehension  Returned demonstration;Verbalized understanding       PT Short Term Goals - 04/11/18 1613      PT SHORT TERM GOAL #1   Title  independent with initial HEP    Time  4    Period  Weeks    Status  Achieved      PT SHORT TERM GOAL #2   Title  understand correct toileting technique to reduce strain on pelvic floor     Time  4    Period  Weeks    Status  On-going      PT SHORT TERM GOAL #3   Title  understand how to perform daily activities with correct breathing to not strain the pelvic floor    Time  4    Period  Weeks    Status  On-going        PT Long Term Goals -  03/14/18 1543      PT LONG TERM GOAL #1   Title  independent with  HEP and understand how to progress herself    Time  8    Period  Weeks    Status  New    Target Date  05/09/18      PT LONG TERM GOAL #2   Title  understand timed voiding to reduce urinary leakage     Time  8    Period  Weeks    Status  New    Target Date  05/09/18      PT LONG TERM GOAL #3   Title  understand ways to manage her urterine and rectal prolapse     Time  8    Period  Weeks    Status  New    Target Date  05/09/18      PT LONG TERM GOAL #4   Title  urinary leakage during the day decreased >/= 50% due to increased pelvic floor strength >/= 3/5    Time  8    Period  Weeks    Status  New    Target Date  05/09/18      PT LONG TERM GOAL #5   Title  sit to stand </= 13 seconds reducing her risk of falls    Time  8    Period  Weeks    Status  New    Target Date  05/09/18            Plan - 04/11/18 1531    Clinical Impression Statement  Patient reports she has urinary leakage with urge to void. Patient will get leg cramps with her exercise. Patient feel some of her leakage is from vaginal discharge. Patient is able to contract her pelvic floor and abdominals with core strength. Patient will benefit from skilled therapy to improve her strength for balance and pelvic floor to reduce urinary leakage.     Rehab Potential  Excellent    Clinical Impairments Affecting Rehab Potential  osteoporosis, Uterine prolaps; rectal prolapse    PT Frequency  1x / week    PT Duration  8 weeks    PT Treatment/Interventions  Biofeedback;Therapeutic exercise;Balance training;Therapeutic activities;Neuromuscular re-education;Patient/family education;Manual techniques    PT Next Visit Plan  education on toileting,education on how to manage prolpase, standing balance exercises, pelvic floor contraction with EMG;  hip strength,     PT Home Exercise Plan  Access Code: [email or web address]    Consulted and Agree with Plan of Care   Patient       Patient will benefit from skilled therapeutic intervention in order to improve the following deficits and impairments:  Increased fascial restricitons, Decreased coordination, Impaired tone, Decreased activity tolerance, Decreased endurance, Decreased strength  Visit Diagnosis: Muscle weakness (generalized)  Other lack of coordination     Problem List Patient Active Problem List   Diagnosis Date Noted  . Abnormal LFTs new 10/01/2014  . Other hemorrhoids 12/19/2013  . Pelvic prolapse 12/19/2013  . Medicare annual wellness visit, initial 12/18/2013  . Vertigo, peripheral 11/21/2012  . Elevated blood pressure reading 11/21/2012  . Dyspnea 10/11/2012  . Chest pain 09/19/2012  . Decreased exercise tolerance 08/21/2012  . Nodule of finger 08/21/2012  . Hx of extrinsic asthma 08/21/2012  . Leukopenia 07/24/2012  . Anemia, normocytic normochromic 07/24/2012  . 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  . Unspecified deficiency anemia 10/04/2011  . Ankle pain 08/29/2011  . Abnormal gait 08/29/2011  . Numbness of foot 08/29/2011  . Leg length discrepancy 08/07/2011  . Scoliosis 08/07/2011  . Hx of fall 08/07/2011  . Medicare annual wellness visit, subsequent 08/07/2011  . Hematuria 02/23/2011  . Rectal prolapse 07/03/2010  . Visit for preventive health examination 06/28/2010  . MALAISE AND FATIGUE 01/27/2010  . NUMBNESS 01/27/2010  . FRACTURE, ANKLE, LEFT 04/10/2009  . HYPERCHOLESTEROLEMIA 05/19/2008  . 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 HEMORRHOIDS 05/19/2008  . UNSPECIFIED VITAMIN D DEFICIENCY 04/06/2007  . Uterine prolapse 04/06/2007  . Iron deficiency anemia 01/03/2007  . DEPRESSION 01/03/2007  . ALLERGIC RHINITIS 01/03/2007  . ASTHMA 01/03/2007  . OSTEOARTHRITIS 01/03/2007  . Osteoporosis 01/03/2007    Belinda Harper, PT 04/11/18 4:14 PM   [facility] [address],  [place], [place], [address] Phone: [phone]   Fax:  [phone]  Name: Belinda Harper MRN: [id number] Date of Birth: 06/17/1940

## 2018-04-19 ENCOUNTER — Encounter: Payer: Self-pay | Admitting: Physical Therapy

## 2018-04-19 ENCOUNTER — Ambulatory Visit: Payer: Medicare Other | Admitting: Physical Therapy

## 2018-04-19 DIAGNOSIS — M6281 Muscle weakness (generalized): Secondary | ICD-10-CM

## 2018-04-19 DIAGNOSIS — R278 Other lack of coordination: Secondary | ICD-10-CM

## 2018-04-19 NOTE — Therapy (Signed)
[facility] [address] [place], [place], [address] Phone: [phone]   Fax:  [phone]  Physical Therapy Treatment  Patient Details  Name: Belinda Harper MRN: [id number] Date of Birth: [birth year] Referring Provider (PT): dr. Cala Bradford   Encounter Date: 04/19/2018  PT End of Session - 04/19/18 1538    Visit Number  3    Date for PT Re-Evaluation  05/09/18    Authorization Type  [employer] medicare    PT Start Time  6629    PT Stop Time  4765   left early due to needing to see doctor about her left hip   PT Time Calculation (min)  13 min    Activity Tolerance  Patient limited by pain       Past Medical History:  Diagnosis Date  . 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 ALLERGIC RHINITIS 01/03/2007  . 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 Anxiety   . ASTHMA 01/03/2007  . Blood transfusion 12 2010   hg 4 transfused 4 units  . DEPRESSION 01/03/2007   resolved  . Family history of adverse reaction to anesthesia    pt's mother as she gets older organs shut down   . FRACTURE, ANKLE, LEFT 04/10/2009  . Gastritis 2002  . GERD (gastroesophageal reflux disease)   . HEMORRHOIDS 05/19/2008  . HYPERCHOLESTEROLEMIA 05/19/2008  . Normal cardiac stress test    2014  . OSTEOARTHRITIS 01/03/2007  . OSTEOPOROSIS 01/03/2007   hx of actonbel use for at least 5 years   . Sepsis due to cellulitis ([place]) 02/05/2015  . Unspecified vitamin D deficiency 04/06/2007  . UTERINE PROLAPSE 04/06/2007    Past Surgical History:  Procedure Laterality Date  . ANKLE FRACTURE SURGERY  02/2009   left ankle fracture and dislocation  . CATARACT EXTRACTION Left    last year   . CATARACT EXTRACTION W/ INTRAOCULAR LENS IMPLANT  2000   right eye  . 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 ORIF FINGER / THUMB FRACTURE  1970   reattached  . PANENDOSCOPY     small bowel bx    There were no vitals filed for this visit.  Subjective Assessment - 04/19/18 1533    Subjective  My left hip has been hurting  and radiates to the left leg. I have numbness and tingling down the left leg. I have not been able to do the exercises due to the pain. I did not know if I should see someone about my pain. I feel the pain will limit me today with the exericises. The other day the left hip pain was sharp and I went down on my knees. My son helped me up.     Patient Stated Goals  reduce urinary leakage    Currently in Pain?  Yes    Pain Score  4     Pain Location  Hip    Pain Orientation  Left    Pain Descriptors / Indicators  Aching    Pain Type  Chronic pain    Pain Radiating Towards  down to left foot  Pain Frequency  Constant    Aggravating Factors   bending forward, bringing left knee to chest, laying on left side,     Pain Relieving Factors  rest, heat    Multiple Pain Sites  No                                 PT Short Term Goals - 04/11/18 1613      PT SHORT TERM GOAL #1   Title  independent with initial HEP    Time  4    Period  Weeks    Status  Achieved      PT SHORT TERM GOAL #2   Title  understand correct toileting technique to reduce strain on pelvic floor     Time  4    Period  Weeks    Status  On-going      PT SHORT TERM GOAL #3   Title  understand how to perform daily activities with correct breathing to not strain the pelvic floor    Time  4    Period  Weeks    Status  On-going        PT Long Term Goals - 03/14/18 1543      PT LONG TERM GOAL #1   Title  independent with  HEP and  understand how to progress herself    Time  8    Period  Weeks    Status  New    Target Date  05/09/18      PT LONG TERM GOAL #2   Title  understand timed voiding to reduce urinary leakage     Time  8    Period  Weeks    Status  New    Target Date  05/09/18      PT LONG TERM GOAL #3   Title  understand ways to manage her urterine and rectal prolapse     Time  8    Period  Weeks    Status  New    Target Date  05/09/18      PT LONG TERM GOAL #4   Title  urinary leakage during the day decreased >/= 50% due to increased pelvic floor strength >/= 3/5    Time  8    Period  Weeks    Status  New    Target Date  05/09/18      PT LONG TERM GOAL #5   Title  sit to stand </= 13 seconds reducing her risk of falls    Time  8    Period  Weeks    Status  New    Target Date  05/09/18            Plan - 04/19/18 1549    Clinical Impression Statement  Patient was not treated today due to left hip pain that radiates down to her leg. The symptoms may be due to her back or left hip. Patient not able to exercise due to the left hip pain. Patient feels her leakage is discharge from the vagina. She will leak some urine with activity. Patient will benefit from skilled therapy to improve her strength for balance and pelvic floor to reduce urinary leakage after her left hip is assessed.     Rehab Potential  Excellent    Clinical Impairments Affecting Rehab Potential  osteoporosis, Uterine prolaps; rectal prolapse    PT Frequency  1x /  week    PT Duration  8 weeks    PT Treatment/Interventions  Biofeedback;Therapeutic exercise;Balance training;Therapeutic activities;Neuromuscular re-education;Patient/family education;Manual techniques    PT Next Visit Plan  education on toileting,education on how to manage prolpase, standing balance exercises, pelvic floor contraction with EMG;  hip strength,     PT Home Exercise Plan  Access Code: [email or web address]    Consulted and Agree with Plan of Care  Patient        Patient will benefit from skilled therapeutic intervention in order to improve the following deficits and impairments:  Increased fascial restricitons, Decreased coordination, Impaired tone, Decreased activity tolerance, Decreased endurance, Decreased strength  Visit Diagnosis: Muscle weakness (generalized)  Other lack of coordination     Problem List Patient Active Problem List   Diagnosis Date Noted  . Abnormal LFTs new 10/01/2014  . Other hemorrhoids 12/19/2013  . Pelvic prolapse 12/19/2013  . Medicare annual wellness visit, initial 12/18/2013  . Vertigo, peripheral 11/21/2012  . Elevated blood pressure reading 11/21/2012  . Dyspnea 10/11/2012  . Chest pain 09/19/2012  . Decreased exercise tolerance 08/21/2012  . Nodule of finger 08/21/2012  . Hx of extrinsic asthma 08/21/2012  . Leukopenia 07/24/2012  . Anemia, normocytic normochromic 07/24/2012  . 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  . Unspecified deficiency anemia 10/04/2011  . Ankle pain 08/29/2011  . Abnormal gait 08/29/2011  . Numbness of foot 08/29/2011  . Leg length discrepancy 08/07/2011  . Scoliosis 08/07/2011  . Hx of fall 08/07/2011  . Medicare annual wellness visit, subsequent 08/07/2011  . Hematuria 02/23/2011  . Rectal prolapse 07/03/2010  . Visit for preventive health examination 06/28/2010  . MALAISE AND FATIGUE 01/27/2010  . NUMBNESS 01/27/2010  . FRACTURE, ANKLE, LEFT 04/10/2009  . HYPERCHOLESTEROLEMIA 05/19/2008  . 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 HEMORRHOIDS 05/19/2008  . UNSPECIFIED VITAMIN D DEFICIENCY 04/06/2007  . Uterine prolapse 04/06/2007  . Iron deficiency anemia 01/03/2007  . DEPRESSION 01/03/2007  . ALLERGIC RHINITIS 01/03/2007  . ASTHMA 01/03/2007  . OSTEOARTHRITIS 01/03/2007  . Osteoporosis 01/03/2007    Earlie Counts, PT 04/19/18 3:53 PM   [facility] [address], [place], [place], [address] Phone: [phone]   Fax:  [phone]  Name: Belinda Harper MRN: [id number] Date of Birth: [birth year]

## 2018-04-23 ENCOUNTER — Encounter (HOSPITAL_COMMUNITY): Payer: Self-pay

## 2018-04-23 ENCOUNTER — Emergency Department (HOSPITAL_COMMUNITY): Payer: Medicare Other

## 2018-04-23 ENCOUNTER — Emergency Department (HOSPITAL_COMMUNITY)
Admission: EM | Admit: 2018-04-23 | Discharge: 2018-04-23 | Disposition: A | Discharge status: Home / Self Care | Payer: Medicare Other | Attending: Emergency Medicine | Admitting: Emergency Medicine

## 2018-04-23 ENCOUNTER — Other Ambulatory Visit: Payer: Self-pay

## 2018-04-23 ENCOUNTER — Ambulatory Visit: Payer: Self-pay | Admitting: *Deleted

## 2018-04-23 DIAGNOSIS — J45909 Unspecified asthma, uncomplicated: Secondary | ICD-10-CM | POA: Diagnosis not present

## 2018-04-23 DIAGNOSIS — R42 Dizziness and giddiness: Secondary | ICD-10-CM | POA: Insufficient documentation

## 2018-04-23 DIAGNOSIS — R55 Syncope and collapse: Secondary | ICD-10-CM | POA: Diagnosis not present

## 2018-04-23 DIAGNOSIS — Z79899 Other long term (current) drug therapy: Secondary | ICD-10-CM | POA: Diagnosis not present

## 2018-04-23 LAB — RETICULOCYTES
Immature Retic Fract: 11.7 % (ref 2.3–15.9)
RBC.: 3.54 MIL/uL — ABNORMAL LOW (ref 3.87–5.11)
Retic Count, Absolute: 62.3 10*3/uL (ref 19.0–186.0)
Retic Ct Pct: 1.8 % (ref 0.4–3.1)

## 2018-04-23 LAB — CBC WITH DIFFERENTIAL/PLATELET
Abs Immature Granulocytes: 0.02 10*3/uL (ref 0.00–0.07)
Basophils Absolute: 0.1 10*3/uL (ref 0.0–0.1)
Basophils Relative: 1 %
Eosinophils Absolute: 0.2 10*3/uL (ref 0.0–0.5)
Eosinophils Relative: 3 %
HCT: 32.3 % — ABNORMAL LOW (ref 36.0–46.0)
Hemoglobin: 9.7 g/dL — ABNORMAL LOW (ref 12.0–15.0)
Immature Granulocytes: 0 %
Lymphocytes Relative: 19 %
Lymphs Abs: 0.9 10*3/uL (ref 0.7–4.0)
MCH: 27.6 pg (ref 26.0–34.0)
MCHC: 30 g/dL (ref 30.0–36.0)
MCV: 92 fL (ref 80.0–100.0)
Monocytes Absolute: 0.5 10*3/uL (ref 0.1–1.0)
Monocytes Relative: 9 %
Neutro Abs: 3.3 10*3/uL (ref 1.7–7.7)
Neutrophils Relative %: 68 %
Platelets: 244 10*3/uL (ref 150–400)
RBC: 3.51 MIL/uL — ABNORMAL LOW (ref 3.87–5.11)
RDW: 16.8 % — ABNORMAL HIGH (ref 11.5–15.5)
WBC: 4.9 10*3/uL (ref 4.0–10.5)
nRBC: 0 % (ref 0.0–0.2)

## 2018-04-23 LAB — COMPREHENSIVE METABOLIC PANEL
ALT: 26 U/L (ref 0–44)
AST: 32 U/L (ref 15–41)
Albumin: 3.5 g/dL (ref 3.5–5.0)
Alkaline Phosphatase: 64 U/L (ref 38–126)
Anion gap: 12 (ref 5–15)
BUN: 22 mg/dL (ref 8–23)
CO2: 25 mmol/L (ref 22–32)
Calcium: 9.2 mg/dL (ref 8.9–10.3)
Chloride: 104 mmol/L (ref 98–111)
Creatinine, Ser: 0.89 mg/dL (ref 0.44–1.00)
GFR calc Af Amer: >60 mL/min (ref >60)
GFR calc non Af Amer: >60 mL/min (ref >60)
Glucose, Bld: 87 mg/dL (ref 70–99)
Potassium: 3.9 mmol/L (ref 3.5–5.1)
Sodium: 141 mmol/L (ref 135–145)
Total Bilirubin: 0.2 mg/dL — ABNORMAL LOW (ref 0.3–1.2)
Total Protein: 6.5 g/dL (ref 6.5–8.1)

## 2018-04-23 LAB — TROPONIN I: Troponin I: <0.03 ng/mL (ref <0.03)

## 2018-04-23 LAB — BRAIN NATRIURETIC PEPTIDE: B Natriuretic Peptide: 79.9 pg/mL (ref 0.0–100.0)

## 2018-04-23 LAB — CBG MONITORING, ED: Glucose-Capillary: 78 mg/dL (ref 70–99)

## 2018-04-23 MED ORDER — LACTATED RINGERS IV BOLUS
500.0000 mL | Freq: Once | INTRAVENOUS | Status: AC
  Administered 2018-04-23: 500 mL via INTRAVENOUS

## 2018-04-23 NOTE — ED Triage Notes (Signed)
Per [facility], pt from home w/ a c/o near syncope. The pt was standing when she began to feel dizzy, saw a flash of white, and then sat herself down in a chair. The pt did not fall and did not sustain any injuries. Positive for orthostatic hypotension. Reports of having one episode of diarrhea this morning after ingesting a milk shake.   Systolic BP change  (sitting to standing) w/ EMS: 108 --> 90  HR change sitting to standing: 80s --> 100s  V/S at rest: 120/54 HR 78 RR 20 CBG 175  20 ga IV L AC

## 2018-04-23 NOTE — Telephone Encounter (Signed)
Stay with the Tylenol    We may need to have her see ortho or sports medicine based on her history.

## 2018-04-23 NOTE — ED Provider Notes (Signed)
[facility] EMERGENCY DEPARTMENT Provider Note   CSN: [id number] Arrival date & time: 04/23/18  1603     History   Chief Complaint Chief Complaint  Patient presents with  . Loss of Consciousness    HPI Belinda Harper is a 77 y.o. female.  The history is provided by the patient and medical records. No language interpreter was used.    Patient is a 77 year old female with past medical history of iron deficiency anemia, anxiety, asthma, depression, high cholesterol who presents via EMS from home for evaluation of a presyncopal episode.  Patient was reportedly walking from one room in her house to another room when she felt lightheaded and had some bright lights in both visual fields.  She states she immediately sat down with improvement in her lightheadedness and visual disturbance.  She did not lose consciousness, fall, hit her head, and states she feels she is back to her baseline upon presentation to emergency department.  She notes some diarrhea over the last 2 days that has been nonbloody but denies any fevers, chills, headache, neck pain, eye pain, blurry vision, double vision, chest pain, cough, shortness of breath, abdominal pain, dysuria, blood in her urine, focal extremity weakness, rash, or other acute complaints.  She does note chronic paresthesias in her left foot that were previously attributed to radiculopathy.  She also notes she does have chronically dark stools as she is currently on iron supplementation for her anemia.  She denies prior similar episodes.  Denies alleviating or aggravating factors.  Denies any history of malignancy, CVA, CAD, A. fib, a flutter, PE/DVT, or other acute concerns.  Past Medical History:  Diagnosis Date  . 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 ALLERGIC RHINITIS 01/03/2007  . 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 Anxiety   . ASTHMA 01/03/2007  . Blood transfusion 12 2010   hg 4 transfused 4 units  .  DEPRESSION 01/03/2007   resolved  . Family history of adverse reaction to anesthesia    pt's mother as she gets older organs shut down   . FRACTURE, ANKLE, LEFT 04/10/2009  . Gastritis 2002  . GERD (gastroesophageal reflux disease)   . HEMORRHOIDS 05/19/2008  . HYPERCHOLESTEROLEMIA 05/19/2008  . Normal cardiac stress test    2014  . OSTEOARTHRITIS 01/03/2007  . OSTEOPOROSIS 01/03/2007   hx of actonbel use for at least 5 years   . Sepsis due to cellulitis ([place]) 02/05/2015  . Unspecified vitamin D deficiency 04/06/2007  . UTERINE PROLAPSE 04/06/2007    Patient Active Problem List   Diagnosis Date Noted  . Abnormal LFTs new 10/01/2014  . Other hemorrhoids 12/19/2013  . Pelvic prolapse 12/19/2013  . Medicare annual wellness visit, initial 12/18/2013  . Vertigo, peripheral 11/21/2012  . Elevated blood pressure reading 11/21/2012  . Dyspnea 10/11/2012  . Chest pain 09/19/2012  . Decreased exercise tolerance 08/21/2012  . Nodule of finger 08/21/2012  . Hx of extrinsic asthma 08/21/2012  . Leukopenia 07/24/2012  . Anemia, normocytic normochromic 07/24/2012  . 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  . Unspecified deficiency anemia 10/04/2011  . Ankle pain 08/29/2011  . Abnormal gait 08/29/2011  . Numbness of foot 08/29/2011  . Leg length discrepancy 08/07/2011  . Scoliosis 08/07/2011  . Hx of fall 08/07/2011  . Medicare annual wellness visit, subsequent 08/07/2011  . Hematuria 02/23/2011  . Rectal prolapse 07/03/2010  . Visit for preventive health examination 06/28/2010  . MALAISE AND FATIGUE 01/27/2010  . NUMBNESS  01/27/2010  . FRACTURE, ANKLE, LEFT 04/10/2009  . HYPERCHOLESTEROLEMIA 05/19/2008  . 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 HEMORRHOIDS 05/19/2008  . UNSPECIFIED VITAMIN D DEFICIENCY 04/06/2007  . Uterine prolapse 04/06/2007  . Iron deficiency anemia 01/03/2007  . DEPRESSION 01/03/2007  . ALLERGIC RHINITIS 01/03/2007  . ASTHMA 01/03/2007    . OSTEOARTHRITIS 01/03/2007  . Osteoporosis 01/03/2007    Past Surgical History:  Procedure Laterality Date  . ANKLE FRACTURE SURGERY  02/2009   left ankle fracture and dislocation  . CATARACT EXTRACTION Left    last year   . CATARACT EXTRACTION W/ INTRAOCULAR LENS IMPLANT  2000   right eye  . 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 ORIF FINGER / THUMB FRACTURE  1970   reattached  . PANENDOSCOPY     small bowel bx     OB History    Gravida  3   Para  2   Term      Preterm      AB  1   Living  2     SAB  1   TAB      Ectopic      Multiple      Live Births               Home Medications    Prior to Admission medications   Medication Sig Start Date End Date Taking? Authorizing Provider  acetaminophen (TYLENOL) 500 MG tablet Take 1,000 mg by mouth every 6 (six) hours as needed (pain).   Yes [provider]  amLODipine (NORVASC) 5 MG tablet TAKE 1 TABLET (5 MG TOTAL) BY MOUTH DAILY. Patient taking differently: Take 5 mg by mouth at bedtime.  11/03/17  Yes Panosh, Standley Brooking, MD  Calcium Citrate-Vitamin D (CITRACAL + D PO) Take 1 tablet by mouth daily with lunch. 500 mg calcium (2 X 250 mg)   Yes [provider]  FERROUS GLUCONATE PO Take 162 mg by mouth at bedtime. 6 tablets X 27 mg elemental iron = 162 mg elemental iron (each tablet also contains 110 mg calcium)   Yes [provider]  Magnesium 500 MG TABS Take 500 mg by mouth daily with lunch.    Yes [provider]  Menatetrenone (VITAMIN K2) 100 MCG TABS Take 100 mcg by mouth daily.   Yes  [provider]  Multiple Vitamin (MULTIVITAMIN WITH MINERALS) TABS tablet Take 1 tablet by mouth daily. One a Day - Women's   Yes [provider]  Multiple Vitamins-Minerals (HAIR/SKIN/NAILS/BIOTIN) TABS Take 1 tablet by mouth daily before lunch.   Yes [provider]  vitamin C (ASCORBIC ACID) 500 MG tablet Take 500 mg by mouth at bedtime.    Yes [provider]    Family History Family History  Problem Relation Age of Onset  . Hyperlipidemia Mother   . Heart disease Mother        CABG age 61  . Ovarian cancer Mother   . Uterine cancer Mother   . Arthritis Mother   . Osteoporosis Mother   . Heart disease Father  CABG age 86  . Arthritis Father   . Melanoma Father     Social History Social History   Tobacco Use  . Smoking status: Never Smoker  . Smokeless tobacco: Never Used  Substance Use Topics  . Alcohol use: Yes    Comment: rare  . Drug use: No     Allergies   Nsaids; Yellow jacket venom [bee venom]; Sulfa antibiotics; and Sulfamethoxazole   Review of Systems Review of Systems  Constitutional: Negative for chills and fever.  HENT: Negative for ear pain and sore throat.   Eyes: Positive for visual disturbance. Negative for pain.  Respiratory: Negative for cough and shortness of breath.   Cardiovascular: Negative for chest pain and palpitations.  Gastrointestinal: Negative for abdominal pain and vomiting.  Genitourinary: Negative for dysuria and hematuria.  Musculoskeletal: Negative for arthralgias and back pain.  Skin: Negative for color change and rash.  Neurological: Positive for light-headedness. Negative for seizures and syncope.  All other systems reviewed and are negative.    Physical Exam Updated Vital Signs BP (!) 151/48 (BP Location: Right Arm)   Pulse 72   Temp 97.9 F (36.6 C) (Oral)   Resp 19   Ht 5\' 7"  (1.702 m)   Wt 72.6 kg   SpO2 99%   BMI 25.06 kg/m   Physical Exam Vitals signs and  nursing note reviewed.  Constitutional:      General: She is not in acute distress.    Appearance: She is well-developed.  HENT:     Head: Normocephalic and atraumatic.  Eyes:     Conjunctiva/sclera: Conjunctivae normal.  Neck:     Musculoskeletal: Neck supple.  Cardiovascular:     Rate and Rhythm: Normal rate and regular rhythm.     Heart sounds: No murmur.  Pulmonary:     Effort: Pulmonary effort is normal. No respiratory distress.     Breath sounds: Normal breath sounds.  Abdominal:     Palpations: Abdomen is soft.     Tenderness: There is no abdominal tenderness.  Skin:    General: Skin is warm and dry.  Neurological:     General: No focal deficit present.     Mental Status: She is alert and oriented to person, place, and time. Mental status is at baseline.     Cranial Nerves: No cranial nerve deficit.     Motor: No weakness.     Coordination: Coordination normal.      ED Treatments / Results  Labs (all labs ordered are listed, but only abnormal results are displayed) Labs Reviewed  CBC WITH DIFFERENTIAL/PLATELET - Abnormal; Notable for the following components:      Result Value   RBC 3.51 (*)    Hemoglobin 9.7 (*)    HCT 32.3 (*)    RDW 16.8 (*)    All other components within normal limits  COMPREHENSIVE METABOLIC PANEL - Abnormal; Notable for the following components:   Total Bilirubin 0.2 (*)    All other components within normal limits  RETICULOCYTES - Abnormal; Notable for the following components:   RBC. 3.54 (*)    All other components within normal limits  TROPONIN I  BRAIN NATRIURETIC PEPTIDE  CBG MONITORING, ED    EKG None  Radiology Dg Chest 2 View  Result Date: 04/23/2018 CLINICAL DATA:  Near syncope. EXAM: CHEST - 2 VIEW COMPARISON:  02/20/2015. FINDINGS: Interval borderline enlargement of the cardiac silhouette. The lungs remain hyperexpanded with normal vascularity and mild diffuse peribronchial thickening and  accentuation of the  interstitial markings. Moderate thoracolumbar scoliosis is unchanged. IMPRESSION: 1. Interval borderline cardiomegaly. 2. Stable changes of COPD and chronic bronchitis. Electronically Signed   By: Claudie Revering M.D.   On: 04/23/2018 17:29    Procedures Procedures (including critical care time)  Medications Ordered in ED Medications  lactated ringers bolus 500 mL (0 mLs Intravenous Stopped 04/23/18 1906)     Initial Impression / Assessment and Plan / ED Course  I have reviewed the triage vital signs and the nursing notes.  Pertinent labs & imaging results that were available during my care of the patient were reviewed by me and considered in my medical decision making (see chart for details).    Patient is a 77 year old female presents above-stated history exam.  On presentation patient is afebrile stable vital signs.  Exam as above remarkable for no focal neurological deficits, lungs clear to auscultation bilaterally, abdomen soft nontender.  Low suspicion for ACS given patient denies any chest pain, ECG shows normal ventricular rate at 78 with normal intervals and no signs of acute ischemic change.  In addition troponin is undetectable.  Chest x-ray shows cardiomegaly without any signs of pulmonary edema, pneumonia, pneumothorax, acute traumatic injury, pleural effusions, or other significant acute findings within the chest that are within the limits of the study.  BNP is 79.9.  CMP is WNL without any significant electrolyte or metabolic abnormalities.  CBC shows a WBC count of 4.9 and a platelet count of 244.  Hemoglobin is 9.7 which is compared to that obtained 6 months ago of 11.8.  This does not appear to be acute significant drop.  Patient was ambulated in the hall after receiving IV fluids and was noted to not develop any tachycardia or hypoxia.  In addition she denied any presyncopal symptoms after standing up and ambulating.  Impression is dehydration.  Patient discharged stable  condition.  Strict return precautions advised and discussed.  Instructed to follow-up with her PCP in 3 to 5 days.  Patient voiced understanding and agreement with this plan prior to discharge.  Final Clinical Impressions(s) / ED Diagnoses   Final diagnoses:  Near syncope    ED Discharge Orders    None       Hulan Saas, MD 04/23/18 3474    Pattricia Boss, MD 04/24/18 2015

## 2018-04-23 NOTE — Telephone Encounter (Signed)
Pt reports lower back pain. States H/O "For some time." States worsening since Thursday, "Now mostly in left hip." States radiates from left hip down left leg to foot.  Rates at 10/10 initially in AM, 7/10 after taking ES Tylenol.  States is taking 2 ES tylenol 3-4 times /day. Also applying ice/ heat. States rest helps as well. Denies redness, swelling, no dysuria, denies any bowel/ bladder issues. Denies fever. Reports "Maybe mild swelling of foot, shoe feels tighter."   Pt has appt Thursday with Dr. Regis Bill.  Pt is requesting something else for pain be called in for her until appt.. Pt made aware would probably need to be seen first, verbalizes understanding. Assured pt [place] would route request to Dr. Regis Bill. Also cautioned regarding tylenol, discussed max dose recommended. Verbalizes understanding.   Reason for Disposition . [1] Pain radiates into the thigh or further down the leg AND [2] one leg  Answer Assessment - Initial Assessment Questions 1. ONSET: "When did the pain begin?"      Worsening since Thursday 2. LOCATION: "Where does it hurt?" (upper, mid or lower back)      Left hip radiates to back of leg down to back of ankle and foot 3. SEVERITY: "How bad is the pain?"  (e.g., Scale 1-10; mild, moderate, or severe)   - MILD (1-3): doesn't interfere with normal activities    - MODERATE (4-7): interferes with normal activities or awakens from sleep    - SEVERE (8-10): excruciating pain, unable to do any normal activities      10/10 at times; taking tylenol and resting 4. PATTERN: "Is the pain constant?" (e.g., yes, no; constant, intermittent)      Constant  7/10 when less 5. RADIATION: "Does the pain shoot into your legs or elsewhere?"     From left hip down to foot 6. CAUSE:  "What do you think is causing the back pain?"      "I've back pain for years. My left leg is shorter than right." 7. BACK OVERUSE:  "Any recent lifting of heavy objects, strenuous work or exercise?"    no 8.  MEDICATIONS: "What have you taken so far for the pain?" (e.g., nothing, acetaminophen, NSAIDS)     ES Tylenol, heat and ice, rest 9. NEUROLOGIC SYMPTOMS: "Do you have any weakness, numbness, or problems with bowel/bladder control?"     no 10. OTHER SYMPTOMS: "Do you have any other symptoms?" (e.g., fever, abdominal pain, burning with urination, blood in urine)      no  Protocols used: BACK PAIN-A-AH

## 2018-04-23 NOTE — ED Notes (Signed)
Patient verbalizes understanding of discharge instructions. Opportunity for questioning and answers were provided. Armband removed by staff, pt discharged from ED ambulatory.   

## 2018-04-23 NOTE — ED Notes (Signed)
Patient transported to X-ray 

## 2018-04-23 NOTE — Telephone Encounter (Signed)
Please advise  Kerrin Champagne., RMA

## 2018-04-24 NOTE — Progress Notes (Signed)
Chief Complaint  Patient presents with  . Leg Pain    Left leg, hip, foot and lower back. X1 week tried extra strength tyenol about every 6 hours.     HPI: Belinda Harper 77 y.o. come in for follow-up of Ed visit for syncope and ligh headedness that she had that was felt to be from hydration issues although her hemoglobin had dropped for her even though she takes iron on a regular basis and has had a full work-up for iron deficiency anemia. She has been having 1+ week of left leg pain that is more severe than usual and numbness to her foot down the back of her leg that comes and goes other time she has low back pain she feels that her left leg feels taller than her right but is had a leg length discrepancy the other way.  She has a remote history of ankle fracture and infection left lower extremity. There is been no fever or UTI symptoms but is under treatment with physical therapy for urinary leakage. She is also due for follow-up labs cholesterol she has been working on and would like it repeated today.  Denies chest pain shortness of breath no other presyncopal symptoms.  No UTI symptoms.  Only trigger she can think of would be that she had been in bed more often from the leg pain but no specific orthostatic chronic symptoms. Had been in bed   From leg pain .   at night .  No more hemorroiuds at this time.  6 ferrous gluconate  Per day .   Asked to see if chiropractor would help her. ROS: See pertinent positives and negatives per HPI.  Past Medical History:  Diagnosis Date  . 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 ALLERGIC RHINITIS 01/03/2007  . 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 Anxiety   . ASTHMA 01/03/2007  . Blood transfusion 12 2010   hg 4 transfused 4 units  . DEPRESSION 01/03/2007   resolved  . Family history of adverse reaction to anesthesia    pt's mother as she gets older organs shut down   . FRACTURE, ANKLE, LEFT 04/10/2009  . Gastritis 2002    . GERD (gastroesophageal reflux disease)   . HEMORRHOIDS 05/19/2008  . HYPERCHOLESTEROLEMIA 05/19/2008  . Normal cardiac stress test    2014  . OSTEOARTHRITIS 01/03/2007  . OSTEOPOROSIS 01/03/2007   hx of actonbel use for at least 5 years   . Sepsis due to cellulitis ([place]) 02/05/2015  . Unspecified vitamin D deficiency 04/06/2007  . UTERINE PROLAPSE 04/06/2007    Family History  Problem Relation Age of Onset  . Hyperlipidemia Mother   . Heart disease Mother        CABG age 27  . Ovarian cancer Mother   . Uterine cancer Mother   . Arthritis Mother   . Osteoporosis Mother   . Heart disease Father        CABG age 33  . Arthritis Father   . Melanoma Father     Social History   Socioeconomic History  . Marital status: Divorced    Spouse name: Not on file  . Number of children: 2  . Years of education: Not on file  . Highest education level: Not on file  Occupational History  . Not on file  Social Needs  . Financial resource strain: Not on file  . Food insecurity:    Worry: Not on file  Inability: Not on file  . Transportation needs:    Medical: Not on file    Non-medical: Not on file  Tobacco Use  . Smoking status: Never Smoker  . Smokeless tobacco: Never Used  Substance and Sexual Activity  . Alcohol use: Yes    Comment: rare  . Drug use: No  . Sexual activity: Not Currently    Comment: 1st intercourse- 18, partners- 1   Lifestyle  . Physical activity:    Days per week: Not on file    Minutes per session: Not on file  . Stress: Not on file  Relationships  . Social connections:    Talks on phone: Not on file    Gets together: Not on file    Attends religious service: Not on file    Active member of club or organization: Not on file    Attends meetings of clubs or organizations: Not on file    Relationship status: Not on file  Other Topics Concern  . Not on file  Social History Narrative   HH of  1   To rent out room    Retired Dispensing optician    Never smoked    Pet cat allergic    Divorced   Regular exericise  walking mowing the lawnswimming   Silver sneakers     Outpatient Medications Prior to Visit  Medication Sig Dispense Refill  . acetaminophen (TYLENOL) 500 MG tablet Take 1,000 mg by mouth every 6 (six) hours as needed (pain).    [name] amLODipine (NORVASC) 5 MG tablet TAKE 1 TABLET (5 MG TOTAL) BY MOUTH DAILY. (Patient taking differently: Take 5 mg by mouth at bedtime. ) 90 tablet 3  . Calcium Citrate-Vitamin D (CITRACAL + D PO) Take 1 tablet by mouth daily with lunch. 500 mg calcium (2 X 250 mg)    . FERROUS GLUCONATE PO Take 162 mg by mouth at bedtime. 6 tablets X 27 mg elemental iron = 162 mg elemental iron (each tablet also contains 110 mg calcium)    . Magnesium 500 MG TABS Take 500 mg by mouth daily with lunch.     . Menatetrenone (VITAMIN K2) 100 MCG TABS Take 100 mcg by mouth daily.    . Multiple Vitamin (MULTIVITAMIN WITH MINERALS) TABS tablet Take 1 tablet by mouth daily. One a Day - Women's    . Multiple Vitamins-Minerals (HAIR/SKIN/NAILS/BIOTIN) TABS Take 1 tablet by mouth daily before lunch.    . vitamin C (ASCORBIC ACID) 500 MG tablet Take 500 mg by mouth at bedtime.      Facility-Administered Medications Prior to Visit  Medication Dose Route Frequency Provider Last Rate Last Dose  . cloNIDine (CATAPRES) tablet 0.1 mg  0.1 mg Oral Once Nafziger, Cory, NP         EXAM:  BP (!) 150/80 (BP Location: Right Arm, Patient Position: Sitting, Cuff Size: Normal)   Pulse 74   Temp 97.9 F (36.6 C) (Oral)   Ht 5\' 7"  (1.702 m)   Wt 152 lb 12.8 oz (69.3 kg)   BMI 23.93 kg/m   Body mass index is 23.93 kg/m.  GENERAL: vitals reviewed and listed above, alert, oriented, appears well hydrated and in no acute distress HEENT: atraumatic, conjunctiva  clear, no obvious abnormalities on inspection of external nose and ears OP : no lesion edema or exudate  NECK: no obvious masses on inspection palpation  LUNGS: clear to  auscultation bilaterally, no wheezes, rales or rhonchi, good air movement CV: HRRR,  no murmur No clubbing cyanosis or  peripheral edema nl cap refill abdomen soft without organomegaly. MS: moves all extremities gait slightly irregular but no weakness or foot drop.  Able to raise toe and heel but left hip appears to be different than right question SLR.  Neurologic no obvious focal weakness.  No midline back tenderness. PSYCH: pleasant and cooperative, no obvious depression or anxiety Lab Results  Component Value Date   WBC 4.9 04/23/2018   HGB 9.7 (L) 04/23/2018   HCT 32.3 (L) 04/23/2018   PLT 244 04/23/2018   GLUCOSE 87 04/23/2018   CHOL 242 (H) 10/20/2017   TRIG 86.0 10/20/2017   HDL 66.80 10/20/2017   LDLDIRECT 141.9 08/06/2012   LDLCALC 158 (H) 10/20/2017   ALT 26 04/23/2018   AST 32 04/23/2018   NA 141 04/23/2018   K 3.9 04/23/2018   CL 104 04/23/2018   CREATININE 0.89 04/23/2018   BUN 22 04/23/2018   CO2 25 04/23/2018   TSH 1.05 10/20/2017   INR 1.04 04/10/2009   BP Readings from Last 3 Encounters:  04/26/18 (!) 150/80  04/23/18 (!) 151/48  02/12/18 140/86    ASSESSMENT AND PLAN:  Discussed the following assessment and plan:  Lumbar back pain with radiculopathy affecting left lower extremity - Plan: Lipid panel, CBC with Differential/Platelet, Iron, TIBC and Ferritin Panel, Ambulatory referral to Orthopedic Surgery  Near syncope - Plan: Lipid panel, CBC with Differential/Platelet, Iron, TIBC and Ferritin Panel  Iron deficiency anemia, unspecified iron deficiency anemia type - Plan: Lipid panel, CBC with Differential/Platelet, Iron, TIBC and Ferritin Panel  Hyperlipidemia, unspecified hyperlipidemia type  Leg length discrepancy - Plan: Ambulatory referral to Orthopedic Surgery  Scoliosis, unspecified scoliosis type, unspecified spinal region - Plan: Ambulatory referral to Orthopedic Surgery  Left hip pain Basically she had presyncopal symptoms felt to be  triggered by her anemia and hydration status and perhaps an activity without obvious cause today. But she has been battling left leg pain and numbness and foot feeling funny for quite a while taking Tylenol without a lot of help and cannot take NSAIDs because of her anemia.  She declines prednisone today. She has a remote history of left ankle fracture and secondary infection and a history of scoliosis and leg length discrepancy.  This may be an aggravation of the underlying cause of her left lower extremity problem. With these new symptoms advise further evaluation.  Referral to [facility]o she has been seen there before for left lower extremity predicament. No obv infection or other alarm sx today   Would hold on trying chiro until evaluated   Will follow up on her anemia  ( this has bee a long standing issue and had been fairly stable.   -Patient advised to return or notify health care team  if  new concerns arise.  Patient Instructions  I would like   You to see ortho and will do a referral to Dr Maretta Los office  Team .   Will do referral.   This seems to  be sciatica.    From back and needs more evlauation.   Lab today and plan follow up for anemia .    Sciatica  Sciatica is pain, numbness, weakness, or tingling along the path of the sciatic nerve. The sciatic nerve starts in the lower back and runs down the back of each leg. The nerve controls the muscles in the lower leg and in the back of the knee. It also provides feeling (sensation)  to the back of the thigh, the lower leg, and the sole of the foot. Sciatica is a symptom of another medical condition that pinches or puts pressure on the sciatic nerve. Generally, sciatica only affects one side of the body. Sciatica usually goes away on its own or with treatment. In some cases, sciatica may keep coming back (recur). What are the causes? This condition is caused by pressure on the sciatic nerve, or pinching of the sciatic nerve.  This may be the result of:  A disk in between the bones of the spine (vertebrae) bulging out too far (herniated disk).  Age-related changes in the spinal disks (degenerative disk disease).  A pain disorder that affects a muscle in the buttock (piriformis syndrome).  Extra bone growth (bone spur) near the sciatic nerve.  An injury or break (fracture) of the pelvis.  Pregnancy.  Tumor (rare). What increases the risk? The following factors may make you more likely to develop this condition:  Playing sports that place pressure or stress on the spine, such as football or weight lifting.  Having poor strength and flexibility.  A history of back injury.  A history of back surgery.  Sitting for long periods of time.  Doing activities that involve repetitive bending or lifting.  Obesity. What are the signs or symptoms? Symptoms can vary from mild to very severe, and they may include:  Any of these problems in the lower back, leg, hip, or buttock: ? Mild tingling or dull aches. ? Burning sensations. ? Sharp pains.  Numbness in the back of the calf or the sole of the foot.  Leg weakness.  Severe back pain that makes movement difficult. These symptoms may get worse when you cough, sneeze, or laugh, or when you sit or stand for long periods of time. Being overweight may also make symptoms worse. In some cases, symptoms may recur over time. How is this diagnosed? This condition may be diagnosed based on:  Your symptoms.  A physical exam. Your health care provider may ask you to do certain movements to check whether those movements trigger your symptoms.  You may have tests, including: ? Blood tests. ? X-rays. ? MRI. ? CT scan. How is this treated? In many cases, this condition improves on its own, without any treatment. However, treatment may include:  Reducing or modifying physical activity during periods of pain.  Exercising and stretching to strengthen your abdomen  and improve the flexibility of your spine.  Icing and applying heat to the affected area.  Medicines that help: ? To relieve pain and swelling. ? To relax your muscles.  Injections of medicines that help to relieve pain, irritation, and inflammation around the sciatic nerve (steroids).  Surgery. Follow these instructions at home: Medicines  Take over-the-counter and prescription medicines only as told by your health care provider.  Do not drive or operate heavy machinery while taking prescription pain medicine. Managing pain  If directed, apply ice to the affected area. ? Put ice in a plastic bag. ? Place a towel between your skin and the bag. ? Leave the ice on for 20 minutes, 2-3 times a day.  After icing, apply heat to the affected area before you exercise or as often as told by your health care provider. Use the heat source that your health care provider recommends, such as a moist heat pack or a heating pad. ? Place a towel between your skin and the heat source. ? Leave the heat on for 20-30  minutes. ? Remove the heat if your skin turns bright red. This is especially important if you are unable to feel pain, heat, or cold. You may have a greater risk of getting burned. Activity  Return to your normal activities as told by your health care provider. Ask your health care provider what activities are safe for you. ? Avoid activities that make your symptoms worse.  Take brief periods of rest throughout the day. Resting in a lying or standing position is usually better than sitting to rest. ? When you rest for longer periods, mix in some mild activity or stretching between periods of rest. This will help to prevent stiffness and pain. ? Avoid sitting for long periods of time without moving. Get up and move around at least one time each hour.  Exercise and stretch regularly, as told by your health care provider.  Do not lift anything that is heavier than 10 lb (4.5 kg) while you  have symptoms of sciatica. When you do not have symptoms, you should still avoid heavy lifting, especially repetitive heavy lifting.  When you lift objects, always use proper lifting technique, which includes: ? Bending your knees. ? Keeping the load close to your body. ? Avoiding twisting. General instructions  Use good posture. ? Avoid leaning forward while sitting. ? Avoid hunching over while standing.  Maintain a healthy weight. Excess weight puts extra stress on your back and makes it difficult to maintain good posture.  Wear supportive, comfortable shoes. Avoid wearing high heels.  Avoid sleeping on a mattress that is too soft or too hard. A mattress that is firm enough to support your back when you sleep may help to reduce your pain.  Keep all follow-up visits as told by your health care provider. This is important. Contact a health care provider if:  You have pain that wakes you up when you are sleeping.  You have pain that gets worse when you lie down.  Your pain is worse than you have experienced in the past.  Your pain lasts longer than 4 weeks.  You experience unexplained weight loss. Get help right away if:  You lose control of your bowel or bladder (incontinence).  You have: ? Weakness in your lower back, pelvis, buttocks, or legs that gets worse. ? Redness or swelling of your back. ? A burning sensation when you urinate. This information is not intended to replace advice given to you by your health care provider. Make sure you discuss any questions you have with your health care provider. Document Released: 04/05/2001 Document Revised: 09/15/2015 Document Reviewed: 12/19/2014 Elsevier Interactive Patient Education  2019 [place]. Kaylen Nghiem M.D.

## 2018-04-24 NOTE — Telephone Encounter (Signed)
Informed pt. Pt said she went to ED yesterday for dehydration and is going to keep her appt. To follow up on ED

## 2018-04-26 ENCOUNTER — Ambulatory Visit: Payer: Medicare Other | Admitting: Internal Medicine

## 2018-04-26 ENCOUNTER — Ambulatory Visit (INDEPENDENT_AMBULATORY_CARE_PROVIDER_SITE_OTHER): Payer: Medicare Other | Admitting: Internal Medicine

## 2018-04-26 ENCOUNTER — Encounter

## 2018-04-26 ENCOUNTER — Ambulatory Visit: Payer: Medicare Other | Admitting: Physical Therapy

## 2018-04-26 ENCOUNTER — Encounter: Payer: Self-pay | Admitting: Internal Medicine

## 2018-04-26 VITALS — BP 150/80 | HR 74 | Temp 97.9°F | Ht 67.0 in | Wt 152.8 lb

## 2018-04-26 DIAGNOSIS — M2170 Unequal limb length (acquired), unspecified site: Secondary | ICD-10-CM

## 2018-04-26 DIAGNOSIS — M5416 Radiculopathy, lumbar region: Secondary | ICD-10-CM

## 2018-04-26 DIAGNOSIS — D509 Iron deficiency anemia, unspecified: Secondary | ICD-10-CM | POA: Diagnosis not present

## 2018-04-26 DIAGNOSIS — E785 Hyperlipidemia, unspecified: Secondary | ICD-10-CM

## 2018-04-26 DIAGNOSIS — M25552 Pain in left hip: Secondary | ICD-10-CM

## 2018-04-26 DIAGNOSIS — R55 Syncope and collapse: Secondary | ICD-10-CM | POA: Diagnosis not present

## 2018-04-26 DIAGNOSIS — M419 Scoliosis, unspecified: Secondary | ICD-10-CM

## 2018-04-26 LAB — CBC WITH DIFFERENTIAL/PLATELET
Basophils Absolute: 0.1 10*3/uL (ref 0.0–0.1)
Basophils Relative: 1.2 % (ref 0.0–3.0)
Eosinophils Absolute: 0.2 10*3/uL (ref 0.0–0.7)
Eosinophils Relative: 4.3 % (ref 0.0–5.0)
HCT: 32.9 % — ABNORMAL LOW (ref 36.0–46.0)
Hemoglobin: 10.8 g/dL — ABNORMAL LOW (ref 12.0–15.0)
Lymphocytes Relative: 28.1 % (ref 12.0–46.0)
Lymphs Abs: 1.2 10*3/uL (ref 0.7–4.0)
MCHC: 32.8 g/dL (ref 30.0–36.0)
MCV: 86.8 fl (ref 78.0–100.0)
Monocytes Absolute: 0.4 10*3/uL (ref 0.1–1.0)
Monocytes Relative: 9.3 % (ref 3.0–12.0)
Neutro Abs: 2.4 10*3/uL (ref 1.4–7.7)
Neutrophils Relative %: 57.1 % (ref 43.0–77.0)
Platelets: 269 10*3/uL (ref 150.0–400.0)
RBC: 3.79 Mil/uL — ABNORMAL LOW (ref 3.87–5.11)
RDW: 17 % — ABNORMAL HIGH (ref 11.5–15.5)
WBC: 4.3 10*3/uL (ref 4.0–10.5)

## 2018-04-26 LAB — LIPID PANEL
Cholesterol: 242 mg/dL — ABNORMAL HIGH (ref 0–200)
HDL: 71.3 mg/dL (ref >39.00)
LDL Cholesterol: 147 mg/dL — ABNORMAL HIGH (ref 0–99)
NonHDL: 170.99
Total CHOL/HDL Ratio: 3
Triglycerides: 121 mg/dL (ref 0.0–149.0)
VLDL: 24.2 mg/dL (ref 0.0–40.0)

## 2018-04-26 NOTE — Patient Instructions (Addendum)
I would like   You to see ortho and will do a referral to Dr Maretta Los office  Team .   Will do referral.   This seems to  be sciatica.    From back and needs more evlauation.   Lab today and plan follow up for anemia .    Sciatica  Sciatica is pain, numbness, weakness, or tingling along the path of the sciatic nerve. The sciatic nerve starts in the lower back and runs down the back of each leg. The nerve controls the muscles in the lower leg and in the back of the knee. It also provides feeling (sensation) to the back of the thigh, the lower leg, and the sole of the foot. Sciatica is a symptom of another medical condition that pinches or puts pressure on the sciatic nerve. Generally, sciatica only affects one side of the body. Sciatica usually goes away on its own or with treatment. In some cases, sciatica may keep coming back (recur). What are the causes? This condition is caused by pressure on the sciatic nerve, or pinching of the sciatic nerve. This may be the result of:  A disk in between the bones of the spine (vertebrae) bulging out too far (herniated disk).  Age-related changes in the spinal disks (degenerative disk disease).  A pain disorder that affects a muscle in the buttock (piriformis syndrome).  Extra bone growth (bone spur) near the sciatic nerve.  An injury or break (fracture) of the pelvis.  Pregnancy.  Tumor (rare). What increases the risk? The following factors may make you more likely to develop this condition:  Playing sports that place pressure or stress on the spine, such as football or weight lifting.  Having poor strength and flexibility.  A history of back injury.  A history of back surgery.  Sitting for long periods of time.  Doing activities that involve repetitive bending or lifting.  Obesity. What are the signs or symptoms? Symptoms can vary from mild to very severe, and they may include:  Any of these problems in the lower back, leg, hip, or  buttock: ? Mild tingling or dull aches. ? Burning sensations. ? Sharp pains.  Numbness in the back of the calf or the sole of the foot.  Leg weakness.  Severe back pain that makes movement difficult. These symptoms may get worse when you cough, sneeze, or laugh, or when you sit or stand for long periods of time. Being overweight may also make symptoms worse. In some cases, symptoms may recur over time. How is this diagnosed? This condition may be diagnosed based on:  Your symptoms.  A physical exam. Your health care provider may ask you to do certain movements to check whether those movements trigger your symptoms.  You may have tests, including: ? Blood tests. ? X-rays. ? MRI. ? CT scan. How is this treated? In many cases, this condition improves on its own, without any treatment. However, treatment may include:  Reducing or modifying physical activity during periods of pain.  Exercising and stretching to strengthen your abdomen and improve the flexibility of your spine.  Icing and applying heat to the affected area.  Medicines that help: ? To relieve pain and swelling. ? To relax your muscles.  Injections of medicines that help to relieve pain, irritation, and inflammation around the sciatic nerve (steroids).  Surgery. Follow these instructions at home: Medicines  Take over-the-counter and prescription medicines only as told by your health care provider.  Do not drive  or operate heavy machinery while taking prescription pain medicine. Managing pain  If directed, apply ice to the affected area. ? Put ice in a plastic bag. ? Place a towel between your skin and the bag. ? Leave the ice on for 20 minutes, 2-3 times a day.  After icing, apply heat to the affected area before you exercise or as often as told by your health care provider. Use the heat source that your health care provider recommends, such as a moist heat pack or a heating pad. ? Place a towel between  your skin and the heat source. ? Leave the heat on for 20-30 minutes. ? Remove the heat if your skin turns bright red. This is especially important if you are unable to feel pain, heat, or cold. You may have a greater risk of getting burned. Activity  Return to your normal activities as told by your health care provider. Ask your health care provider what activities are safe for you. ? Avoid activities that make your symptoms worse.  Take brief periods of rest throughout the day. Resting in a lying or standing position is usually better than sitting to rest. ? When you rest for longer periods, mix in some mild activity or stretching between periods of rest. This will help to prevent stiffness and pain. ? Avoid sitting for long periods of time without moving. Get up and move around at least one time each hour.  Exercise and stretch regularly, as told by your health care provider.  Do not lift anything that is heavier than 10 lb (4.5 kg) while you have symptoms of sciatica. When you do not have symptoms, you should still avoid heavy lifting, especially repetitive heavy lifting.  When you lift objects, always use proper lifting technique, which includes: ? Bending your knees. ? Keeping the load close to your body. ? Avoiding twisting. General instructions  Use good posture. ? Avoid leaning forward while sitting. ? Avoid hunching over while standing.  Maintain a healthy weight. Excess weight puts extra stress on your back and makes it difficult to maintain good posture.  Wear supportive, comfortable shoes. Avoid wearing high heels.  Avoid sleeping on a mattress that is too soft or too hard. A mattress that is firm enough to support your back when you sleep may help to reduce your pain.  Keep all follow-up visits as told by your health care provider. This is important. Contact a health care provider if:  You have pain that wakes you up when you are sleeping.  You have pain that gets  worse when you lie down.  Your pain is worse than you have experienced in the past.  Your pain lasts longer than 4 weeks.  You experience unexplained weight loss. Get help right away if:  You lose control of your bowel or bladder (incontinence).  You have: ? Weakness in your lower back, pelvis, buttocks, or legs that gets worse. ? Redness or swelling of your back. ? A burning sensation when you urinate. This information is not intended to replace advice given to you by your health care provider. Make sure you discuss any questions you have with your health care provider. Document Released: 04/05/2001 Document Revised: 09/15/2015 Document Reviewed: 12/19/2014 Elsevier Interactive Patient Education  2019 [employer].

## 2018-04-27 LAB — IRON,TIBC AND FERRITIN PANEL
%SAT: 11 % (calc) — ABNORMAL LOW (ref 16–45)
Ferritin: 87 ng/mL (ref 16–288)
Iron: 39 ug/dL — ABNORMAL LOW (ref 45–160)
TIBC: 348 mcg/dL (calc) (ref 250–450)

## 2018-05-02 ENCOUNTER — Encounter: Payer: Medicare Other | Admitting: Physical Therapy

## 2018-05-09 ENCOUNTER — Encounter: Payer: Self-pay | Admitting: Physical Therapy

## 2018-05-09 ENCOUNTER — Ambulatory Visit: Payer: Medicare Other | Attending: Obstetrics & Gynecology | Admitting: Physical Therapy

## 2018-05-09 DIAGNOSIS — R278 Other lack of coordination: Secondary | ICD-10-CM | POA: Insufficient documentation

## 2018-05-09 DIAGNOSIS — M6281 Muscle weakness (generalized): Secondary | ICD-10-CM | POA: Insufficient documentation

## 2018-05-09 NOTE — Therapy (Signed)
[facility] [address] [place], [place], [address] Phone: [phone]   Fax:  [phone]  Physical Therapy Treatment  Patient Details  Name: Belinda Harper MRN: [id number] Date of Birth: 1940-06-09 Referring Provider (PT): dr. Cala Bradford   Encounter Date: 05/09/2018  PT End of Session - 05/09/18 1526    Visit Number  4    Date for PT Re-Evaluation  05/09/18    Authorization Type  [employer] medicare    PT Start Time  8546    PT Stop Time  1525    PT Time Calculation (min)  40 min    Activity Tolerance  Patient limited by pain;Patient tolerated treatment well    Behavior During Therapy  [facility] for tasks assessed/performed       Past Medical History:  Diagnosis Date  . 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 ALLERGIC RHINITIS 01/03/2007  . 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 Anxiety   . ASTHMA 01/03/2007  . Blood transfusion 12 2010   hg 4 transfused 4 units  . DEPRESSION 01/03/2007   resolved  . Family history of adverse reaction to anesthesia    pt's mother as she gets older organs shut down   . FRACTURE, ANKLE, LEFT 04/10/2009  . Gastritis 2002  . GERD (gastroesophageal reflux disease)   . HEMORRHOIDS 05/19/2008  . HYPERCHOLESTEROLEMIA 05/19/2008  . Normal cardiac stress test    2014  . OSTEOARTHRITIS 01/03/2007  . OSTEOPOROSIS 01/03/2007   hx of actonbel use for at least 5 years   . Sepsis due to cellulitis ([place]) 02/05/2015  . Unspecified vitamin D deficiency 04/06/2007  . UTERINE PROLAPSE 04/06/2007    Past Surgical History:  Procedure Laterality Date  . ANKLE FRACTURE SURGERY  02/2009   left ankle fracture and dislocation  . CATARACT EXTRACTION Left    last year   . CATARACT EXTRACTION W/ INTRAOCULAR LENS IMPLANT  2000   right eye  . 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 ORIF FINGER / THUMB FRACTURE  1970   reattached  . PANENDOSCOPY     small bowel bx    There were no vitals filed for this visit.  Subjective Assessment - 05/09/18 1445    Subjective  I have been seeing the doctor about my back. I am not able to do certain exercises due to pain in left hip. I am not sure I am leaking urine. I see Dr. Dellis Filbert in February.     Patient Stated Goals  reduce urinary leakage    Currently in Pain?  Yes    Pain Score  4     Pain Location  Hip    Pain Orientation  Left    Pain Descriptors / Indicators  Aching    Pain Type  Chronic pain    Pain Radiating Towards  down to left foot    Pain Onset  More than a month ago    Pain Frequency  Constant    Aggravating Factors   bending forward, bringing left knee to chest, laying on left side    Pain Relieving  Factors  rest, heat    Multiple Pain Sites  No         [facility] PT Assessment - 05/09/18 0001      Assessment   Medical Diagnosis  N39.3 SUI    Referring Provider (PT)  dr. Cala Bradford    Onset Date/Surgical Date  10/23/17    Prior Therapy  none      Precautions   Precautions  Other (comment)    Precaution Comments  osteoporosis      Restrictions   Weight Bearing Restrictions  No      [place] residence      Prior Function   Level of Coates  Retired      Associate Professor   Overall Cognitive Status  Within Functional Limits for tasks assessed      Strength   Right Hip Extension  4/5    Right Hip ABduction  4-/5    Left Hip Extension  4/5    Left Hip ABduction  4-/5      Palpation   SI assessment   pelvis in correct alignment      Standardized  Balance Assessment   Five times sit to stand comments   20 seconds                Pelvic Floor Special Questions - 05/09/18 0001    Strength  weak squeeze, no lift        [facility] Adult PT Treatment/Exercise - 05/09/18 0001      Self-Care   Self-Care  Other Self-Care Comments    Other Self-Care Comments   vaginal mositurizers for drynes      Therapeutic Activites    Therapeutic Activities  Other Therapeutic Activities    Other Therapeutic Activities  correct technique to have a bowel movement;       Lumbar Exercises: Seated   Other Seated Lumbar Exercises  seated pelvic floor exercises for holding 10 seconds and quick flicks      Lumbar Exercises: Supine   Clam  20 reps;1 second   with pelvic floor contraction   Bent Knee Raise  20 reps;1 second   with pelvic floor contraction   Other Supine Lumbar Exercises  ball squeeze hold 5 sec for 10x             PT Education - 05/09/18 1523    Education Details  Access Code: [email or web address] ; information on toileting technique; information on vaginal moisturizers    Person(s) Educated  Patient    Methods  Explanation;Demonstration;Verbal cues;Handout    Comprehension  Returned demonstration;Verbalized understanding       PT Short Term Goals - 05/09/18 1529      PT SHORT TERM GOAL #1   Title  independent with initial HEP    Time  4    Period  Weeks    Status  Achieved      PT SHORT TERM GOAL #2   Title  understand correct toileting technique to reduce strain on pelvic floor     Time  4    Period  Weeks    Status  Achieved      PT SHORT TERM GOAL #3   Title  understand how to perform daily activities with correct breathing to not strain the pelvic floor    Time  4    Period  Weeks    Status  Achieved  PT Long Term Goals - 05/09/18 1452      PT LONG TERM GOAL #1   Title  independent with  HEP and understand how to progress herself    Time  8    Period  Weeks    Status  Achieved      PT LONG TERM  GOAL #2   Title  understand timed voiding to reduce urinary leakage     Time  8    Period  Weeks    Status  Achieved      PT LONG TERM GOAL #3   Title  understand ways to manage her urterine and rectal prolapse     Time  8    Period  Weeks    Status  Achieved      PT LONG TERM GOAL #4   Title  urinary leakage during the day decreased >/= 50% due to increased pelvic floor strength >/= 3/5    Baseline  not sure due to leaking substance that she feels is not urine    Time  8    Period  Weeks    Status  Not Met      PT LONG TERM GOAL #5   Title  sit to stand </= 13 seconds reducing her risk of falls    Baseline  20 sec    Time  8    Period  Weeks    Status  Not Met            Plan - 05/09/18 1500    Clinical Impression Statement  Patient wanted to stop therapy due to her back pain and resume therapy in the future when she feels better. Patient reports sometimes she feels she is not leaking urine but is having a discharge on her underwear. Patient is not able to do some of the exercises due to left hip pain. Patient  is able to demonstrate her HEP correctly and it was adjusted due to left hip pain. Patient has not met all goals due to her wanting to be discharged at this time.     Rehab Potential  Excellent    Clinical Impairments Affecting Rehab Potential  osteoporosis, Uterine prolaps; rectal prolapse    PT Treatment/Interventions  Biofeedback;Therapeutic exercise;Balance training;Therapeutic activities;Neuromuscular re-education;Patient/family education;Manual techniques    PT Next Visit Plan  Discharge to HEP this visit as per patients request.     PT Home Exercise Plan  Access Code: [email or web address]    Consulted and Agree with Plan of Care  Patient       Patient will benefit from skilled therapeutic intervention in order to improve the following deficits and impairments:  Increased fascial restricitons, Decreased coordination, Impaired tone, Decreased activity tolerance, Decreased  endurance, Decreased strength  Visit Diagnosis: Muscle weakness (generalized)  Other lack of coordination     Problem List Patient Active Problem List   Diagnosis Date Noted  . Abnormal LFTs new 10/01/2014  . Other hemorrhoids 12/19/2013  . Pelvic prolapse 12/19/2013  . Medicare annual wellness visit, initial 12/18/2013  . Vertigo, peripheral 11/21/2012  . Elevated blood pressure reading 11/21/2012  . Dyspnea 10/11/2012  . Chest pain 09/19/2012  . Decreased exercise tolerance 08/21/2012  . Nodule of finger 08/21/2012  . Hx of extrinsic asthma 08/21/2012  . Leukopenia 07/24/2012  . Anemia, normocytic normochromic 07/24/2012  . 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  . Unspecified deficiency anemia 10/04/2011  . Ankle pain 08/29/2011  . Abnormal gait 08/29/2011  . Numbness of foot  08/29/2011  . Leg length discrepancy 08/07/2011  . Scoliosis 08/07/2011  . Hx of fall 08/07/2011  . Medicare annual wellness visit, subsequent 08/07/2011  . Hematuria 02/23/2011  . Rectal prolapse 07/03/2010  . Visit for preventive health examination 06/28/2010  . MALAISE AND FATIGUE 01/27/2010  . NUMBNESS 01/27/2010  . FRACTURE, ANKLE, LEFT 04/10/2009  . HYPERCHOLESTEROLEMIA 05/19/2008  . 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 HEMORRHOIDS 05/19/2008  . UNSPECIFIED VITAMIN D DEFICIENCY 04/06/2007  . Uterine prolapse 04/06/2007  . Iron deficiency anemia 01/03/2007  . DEPRESSION 01/03/2007  . ALLERGIC RHINITIS 01/03/2007  . ASTHMA 01/03/2007  . OSTEOARTHRITIS 01/03/2007  . Osteoporosis 01/03/2007    Earlie Counts, PT 05/09/18 3:32 PM   [facility] [address], [place], [place], [address] Phone: [phone]   Fax:  [phone]  Name: IEISHA GAO MRN: [id number] Date of Birth: 17-Oct-1940  PHYSICAL THERAPY DISCHARGE SUMMARY  Visits from Start of Care: 4  Current functional level related to  goals / functional outcomes: See above.    Remaining deficits: See above. Patient wants to be discharged due to her trying to recover from an episode of back pain.    Education / Equipment: HEP Plan: Patient agrees to discharge.  Patient goals were partially met. Patient is being discharged due to the patient's request.  Thank you for the referral. Earlie Counts, PT 05/09/18 3:31 PM  ?????

## 2018-05-09 NOTE — Patient Instructions (Addendum)
Toileting Techniques for Bowel Movements (Defecation) Using your belly (abdomen) and pelvic floor muscles to have a bowel movement is usually instinctive.  Sometimes people can have problems with these muscles and have to relearn proper defecation (emptying) techniques.  If you have weakness in your muscles, organs that are falling out, decreased sensation in your pelvis, or ignore your urge to go, you may find yourself straining to have a bowel movement.  You are straining if you are: . holding your breath or taking in a huge gulp of air and holding it  . keeping your lips and jaw tensed and closed tightly . turning red in the face because of excessive pushing or forcing . developing or worsening your  hemorrhoids . getting faint while pushing . not emptying completely and have to defecate many times a day  If you are straining, you are actually making it harder for yourself to have a bowel movement.  Many people find they are pulling up with the pelvic floor muscles and closing off instead of opening the anus. Due to lack pelvic floor relaxation and coordination the abdominal muscles, one has to work harder to push the feces out.  Many people have never been taught how to defecate efficiently and effectively.  Notice what happens to your body when you are having a bowel movement.  While you are sitting on the toilet pay attention to the following areas: . Jaw and mouth position . Angle of your hips   . Whether your feet touch the ground or not . Arm placement  . Spine position . Waist . Belly tension . Anus (opening of the anal canal)  An Evacuation/Defecation Plan   Here are the 4 basic points:  1. Lean forward enough for your elbows to rest on your knees 2. Support your feet on the floor or use a low stool if your feet don't touch the floor  3. Push out your belly as if you have swallowed a beach ball-you should feel a widening of your waist 4. Open and relax your pelvic floor muscles,  rather than tightening around the anus      The following conditions my require modifications to your toileting posture:  . If you have had surgery in the past that limits your back, hip, pelvic, knee or ankle flexibility . Constipation   Your healthcare practitioner may make the following additional suggestions and adjustments:  1) Sit on the toilet  a) Make sure your feet are supported. b) Notice your hip angle and spine position-most people find it effective to lean forward or raise their knees, which can help the muscles around the anus to relax  c) When you lean forward, place your forearms on your thighs for support  2) Relax suggestions a) Breath deeply in through your nose and out slowly through your mouth as if you are smelling the flowers and blowing out the candles. b) To become aware of how to relax your muscles, contracting and releasing muscles can be helpful.  Pull your pelvic floor muscles in tightly by using the image of holding back gas, or closing around the anus (visualize making a circle smaller) and lifting the anus up and in.  Then release the muscles and your anus should drop down and feel open. Repeat 5 times ending with the feeling of relaxation. c) Keep your pelvic floor muscles relaxed; let your belly bulge out. d) The digestive tract starts at the mouth and ends at the anal opening, so be   sure to relax both ends of the tube.  Place your tongue on the roof of your mouth with your teeth separated.  This helps relax your mouth and will help to relax the anus at the same time.  3) Empty (defecation) a) Keep your pelvic floor and sphincter relaxed, then bulge your anal muscles.  Make the anal opening wide.  b) Stick your belly out as if you have swallowed a beach ball. c) Make your belly wall hard using your belly muscles while continuing to breathe. Doing this makes it easier to open your anus. d) Breath out and give a grunt (or try using other sounds such as  ahhhh, shhhhh, ohhhh or grrrrrrr).  4) Finish a) As you finish your bowel movement, pull the pelvic floor muscles up and in.  This will leave your anus in the proper place rather than remaining pushed out and down. If you leave your anus pushed out and down, it will start to feel as though that is normal and give you incorrect signals about needing to have a bowel movement.    Belinda Harper, PT [facility] [address] [address] [place], [place] [address] Moisturizers . They are used in the vagina to hydrate the mucous membrane that make up the vaginal canal. . Designed to keep a more normal acid balance (ph) . Once placed in the vagina, it will last between two to three days.  . Use 2-3 times per week at bedtime and last longer than 60 min. . Ingredients to avoid is glycerin and fragrance, can increase chance of infection . Should not be used just before sex due to causing irritation . Most are gels administered either in a tampon-shaped applicator or as a vaginal suppository. They are non-hormonal.   Types of Moisturizers . Samul Dada- drug store . Vitamin E vaginal suppositories- Whole foods, Amazon . Moist Again . Coconut oil- can break down condoms . Julva- (Do no use if on Tamoxifen) amazon . Yes moisturizer- amazon . NeuEve Silk , NeuEve Silver for menopausal or over 65 (if have severe vaginal atrophy or cancer treatments use NeuEve Silk for  1 month than move to The Pepsi)- [employer], MapleFlower.dk . Olive and Bee intimate cream- www.oliveandbee.com.au . Mae vaginal moisturizer- Amazon  Creams to use externally on the Vulva area  Albertson's (good for for cancer patients that had radiation to the area)- [place] or [employer].FlyingBasics.com.br  V-magic cream - amazon  Julva-amazon  Vital "V Wild Yam salve ( help moisturize and help with thinning vulvar area, does have [place] by Damiva labial moisturizer ([place],    Things to avoid in the vaginal area . Do not use things to irritate the vulvar area . No lotions just specialized creams for the vulva area- Neogyn, V-magic, No soaps; can use Aveeno or Calendula cleanser if needed. Must be gentle . No deodorants . No douches . Good to sleep without underwear to let the vaginal area to air out . No scrubbing: spread the lips to let warm water rinse over labias and pat dry  About Cystocele Overview The pelvic organs, including the bladder, are normally supported by pelvic floor muscles and ligaments.   When these muscles and ligaments are stretched, weakened or torn, the wall between the bladder and the vagina sags or herniates causing a prolapse, sometimes called a cystocele. This condition may cause discomfort and problems with emptying  the bladder.  It can be present in various stages.  Some people are not aware of the changes. Others may notice changes at the vaginal opening or a feeling of the bladder dropping outside the body. Causes of a Cystocele A cystocele is usually caused by muscle straining or stretching during childbirth.  In addition, cystocele is more common after menopause, because the hormone estrogen helps keep the elastic tissues around the pelvic organs strong.  A cystocele is more likely to occur when levels of estrogen decrease. Other causes include: heavy lifting, chronic coughing, previous pelvic surgery and obesity.  Symptoms A bladder that has dropped from its normal position may cause: unwanted urine leakage (stress incontinence), frequent urination or urge to urinate, incomplete emptying of the bladder (not feeling bladder relief after emptying), pain or discomfort in the vagina, pelvis, groin, lower back or lower abdomen and frequent urinary tract infections.  Mild cases may not cause any symptoms.  Treatment Options . Pelvic floor (Kegel) exercises: Strength training the muscles  in your genital area  . Behavioral changes: Treating and preventing constipation, taking time to empty your bladder properly, learning to lift properly and/or  avoid heavy lifting when possible, stopping smoking, avoiding weight gain and treating a chronic cough or bronchitis. . A pessary: A vaginal support device is sometimes used to help pelvic support caused by muscle and ligament changes. . Surgery: Aurgical repair may be necessary if symptoms cannot be managed with exercise, behavioral changes and a pessary. Surgery is usually considered for severe cases.    [facility] [address] [place], [place] [address] Phone # [phone] Fax [phone]

## 2018-06-13 ENCOUNTER — Encounter: Payer: Medicare Other | Admitting: Obstetrics & Gynecology

## 2018-06-21 ENCOUNTER — Ambulatory Visit (INDEPENDENT_AMBULATORY_CARE_PROVIDER_SITE_OTHER): Payer: Medicare Other | Admitting: Obstetrics & Gynecology

## 2018-06-21 ENCOUNTER — Encounter: Payer: Self-pay | Admitting: Obstetrics & Gynecology

## 2018-06-21 VITALS — BP 152/80 | Ht 67.0 in | Wt 161.0 lb

## 2018-06-21 DIAGNOSIS — Z01419 Encounter for gynecological examination (general) (routine) without abnormal findings: Secondary | ICD-10-CM | POA: Diagnosis not present

## 2018-06-21 DIAGNOSIS — Z4689 Encounter for fitting and adjustment of other specified devices: Secondary | ICD-10-CM | POA: Diagnosis not present

## 2018-06-21 DIAGNOSIS — M810 Age-related osteoporosis without current pathological fracture: Secondary | ICD-10-CM

## 2018-06-21 DIAGNOSIS — Z780 Asymptomatic menopausal state: Secondary | ICD-10-CM

## 2018-06-21 NOTE — Progress Notes (Signed)
Belinda Harper 07/03/1940 [id number]   History:    78 y.o. G3P2A1L2 Divorced  RP:  Established patient presenting for annual gyn exam   HPI: Uterine prolapse, well with pessary Milex ring #6 with support.  No postmenopausal bleeding.  No abnormal vaginal discharge.  Mild urinary incontinence stable since last visit.  Went to physical therapy to improve her pelvic floor, but the exercises made a bulging disc symptomatic with left sciatica.  Seen by an orthopedic surgeon, observing.  Abstinent.  Breast normal.  Body mass index 25.22.  Enjoys gardening.  Health labs with family physician.  Past medical history,surgical history, family history and social history were all reviewed and documented in the EPIC chart.  Gynecologic History No LMP recorded. Patient is postmenopausal. Contraception: post menopausal status Last Pap: 06/2010. Results were: Negative Last mammogram: 08/2017. Results were: Negative Bone Density: 08/2017 Osteoporosis on Ca++/Vit D Colonoscopy: 08/2011, no longer doing screening colono  Obstetric History OB History  Gravida Para Term Preterm AB Living  3 2     1 2   SAB TAB Ectopic Multiple Live Births  1            # Outcome Date GA Lbr Len/2nd Weight Sex Delivery Anes PTL Lv  3 SAB           2 Para           1 Para              ROS: A ROS was performed and pertinent positives and negatives are included in the history.  GENERAL: No fevers or chills. HEENT: No change in vision, no earache, sore throat or sinus congestion. NECK: No pain or stiffness. CARDIOVASCULAR: No chest pain or pressure. No palpitations. PULMONARY: No shortness of breath, cough or wheeze. GASTROINTESTINAL: No abdominal pain, nausea, vomiting or diarrhea, melena or bright red blood per rectum. GENITOURINARY: No urinary frequency, urgency, hesitancy or dysuria. MUSCULOSKELETAL: No joint or muscle pain, no back pain, no recent trauma. DERMATOLOGIC: No rash, no itching, no lesions. ENDOCRINE: No  polyuria, polydipsia, no heat or cold intolerance. No recent change in weight. HEMATOLOGICAL: No anemia or easy bruising or bleeding. NEUROLOGIC: No headache, seizures, numbness, tingling or weakness. PSYCHIATRIC: No depression, no loss of interest in normal activity or change in sleep pattern.     Exam:   BP (!) 152/80 (BP Location: Right Arm, Patient Position: Sitting, Cuff Size: Normal)   Ht 5\' 7"  (1.702 m)   Wt 161 lb (73 kg)   BMI 25.22 kg/m   Body mass index is 25.22 kg/m.  General appearance : Well developed well nourished female. No acute distress HEENT: Eyes: no retinal hemorrhage or exudates,  Neck supple, trachea midline, no carotid bruits, no thyroidmegaly Lungs: Clear to auscultation, no rhonchi or wheezes, or rib retractions  Heart: Regular rate and rhythm, no murmurs or gallops Breast:Examined in sitting and supine position were symmetrical in appearance, no palpable masses or tenderness,  no skin retraction, no nipple inversion, no nipple discharge, no skin discoloration, no axillary or supraclavicular lymphadenopathy Abdomen: no palpable masses or tenderness, no rebound or guarding Extremities: no edema or skin discoloration or tenderness  Pelvic: Vulva: Normal.  Prolapsed urethra.             Vagina: No gross lesions or discharge, no inflammation or ulcer.    Cervix: No gross lesions or discharge.  Pap reflex done.  Uterus  AV, normal size, shape and consistency, non-tender and mobile  Adnexa  Without masses or tenderness  Anus: wnl  Pessary removed, cleaned and reinserted after the gynecologic exam.   Assessment/Plan:  78 y.o. female for annual exam   1. 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  Asymptomatic prolapse of the urethra.  Pap test reflex done today.  Breast exam normal.  Screening mammogram May 2019 was negative.   No longer doing colonoscopy.  Health labs with family physician.  2. Postmenopausal Postmenopausal, well on no hormone replacement therapy.  No postmenopausal bleeding.  3. Age-related osteoporosis without current pathological fracture Osteoporosis per bone density May 2019, on calcium and vitamin D.  Followed by family physician.  Will repeat a bone density in May 2021.  4. Encounter for pessary maintenance Doing well on pessary with Milex ring #6 with support.  Pessary cleaned and put back in place.  Vaginal mucosa normal.  Will continue the same with a pessary maintenance visit in 4 months.  Other orders - Pap IG w/ reflex to HPV when [facility]  [name] MD, 9:05 AM 06/21/2018

## 2018-06-21 NOTE — Patient Instructions (Addendum)
1. 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  Asymptomatic prolapse of the urethra.  Pap test reflex done today.  Breast exam normal.  Screening mammogram May 2019 was negative.  No longer doing colonoscopy.  Health labs with family physician.  2. Postmenopausal Postmenopausal, well on no hormone replacement therapy.  No postmenopausal bleeding.  3. Age-related osteoporosis without current pathological fracture Osteoporosis per bone density May 2019, on calcium and vitamin D.  Followed by family physician.  Will repeat a bone density in May 2021.  4. Encounter for pessary maintenance Doing well on pessary with Milex ring #6 with support.  Pessary cleaned and put back in place.  Vaginal mucosa normal.  Will continue the same with a pessary maintenance visit in 4 months.  Other orders - Pap IG w/ reflex to HPV when [facility]  Ghazal, it was a pleasure seeing you today!  I will inform you of your results as soon as they are available.

## 2018-06-23 LAB — PAP IG W/ RFLX HPV ASCU

## 2018-06-25 ENCOUNTER — Encounter: Payer: Self-pay | Admitting: *Deleted

## 2018-09-27 LAB — HM MAMMOGRAPHY

## 2018-10-17 ENCOUNTER — Other Ambulatory Visit: Payer: Self-pay

## 2018-10-17 ENCOUNTER — Ambulatory Visit (INDEPENDENT_AMBULATORY_CARE_PROVIDER_SITE_OTHER): Payer: Medicare Other | Admitting: Obstetrics & Gynecology

## 2018-10-17 ENCOUNTER — Encounter: Payer: Self-pay | Admitting: Obstetrics & Gynecology

## 2018-10-17 VITALS — BP 140/82

## 2018-10-17 DIAGNOSIS — Z4689 Encounter for fitting and adjustment of other specified devices: Secondary | ICD-10-CM | POA: Diagnosis not present

## 2018-10-17 DIAGNOSIS — R600 Localized edema: Secondary | ICD-10-CM | POA: Diagnosis not present

## 2018-10-17 NOTE — Progress Notes (Signed)
    Belinda Harper 08-04-1940 [id number]        78 y.o.  [id number] Divorced  RP: Pessary maintenance  HPI: Doing well with pessary Milex ring #6 with support.  No abnormal vaginal discharge.  No vaginal bleeding.  No pelvic or vaginal pain.  Had a bladder infection well treated with antibiotics.  Complains of a right swollen leg for 1 week.  No redness, no heat, no pain.  No shortness of breath.   OB History  Gravida Para Term Preterm AB Living  3 2     1 2   SAB TAB Ectopic Multiple Live Births  1            # Outcome Date GA Lbr Len/2nd Weight Sex Delivery Anes PTL Lv  3 SAB           2 Para           1 Para             Past medical history,surgical history, problem list, medications, allergies, family history and social history were all reviewed and documented in the EPIC chart.   Directed ROS with pertinent positives and negatives documented in the history of present illness/assessment and plan.  Exam:  Vitals:   10/17/18 1606  BP: 140/82   General appearance:  Normal  Gynecologic exam: Vulva normal.  Pessary removed easily and thoroughly cleaned with antibacterial soap.  Vaginal mucosa intact.  Pessary put back in place without difficulty.  Right lower leg with moderate edema.  No redness, no heat, nontender.  Good pulses.   Assessment/Plan:  78 y.o. [id number]   1. Encounter for pessary maintenance Well with pessary Milex ring #6 with support.  No complication.  Precautions reviewed.  Follow-up in 4 months for pessary maintenance.  2. Leg edema, right Rule out right lower limb DVT.  Right lower limb Doppler will be organized.  Strongly recommended that patient schedules an urgent visit with her primary care Dr. Shanon Ace.  Counseling on above issues and coordination of care more than 50% for 15 minutes.  [name] MD, 4:14 PM 10/17/2018

## 2018-10-18 ENCOUNTER — Encounter: Payer: Self-pay | Admitting: Obstetrics & Gynecology

## 2018-10-18 ENCOUNTER — Telehealth: Payer: Self-pay | Admitting: *Deleted

## 2018-10-18 DIAGNOSIS — M7989 Other specified soft tissue disorders: Secondary | ICD-10-CM

## 2018-10-18 NOTE — Telephone Encounter (Signed)
Patient scheduled at [facility] [address] on 10/19/18 @ 1:00pm, arrival time 12:45 to check in, must come alone and wear face mask due to COVID.   Left message for pt to call

## 2018-10-18 NOTE — Telephone Encounter (Signed)
Patient called back in my voice mail and let me know she did get the message with appt time/day and will be there tomorrow.

## 2018-10-18 NOTE — Telephone Encounter (Signed)
-----   Message from [name], MD sent at 10/17/2018  4:21 PM EDT ----- Regarding: Schedule a Rt Lower Limb Doppler Rt leg swollen x 1 week.  R/O DVT.

## 2018-10-18 NOTE — Telephone Encounter (Signed)
Patient called back in voice mail. I called her back and got her voice mail. Per DPR access note on file I left a detailed message and read her what Belinda Harper had documented about the appointment. I asked her to call back and confirm she received the message.

## 2018-10-18 NOTE — Patient Instructions (Signed)
1. Encounter for pessary maintenance Well with pessary Milex ring #6 with support.  No complication.  Precautions reviewed.  Follow-up in 4 months for pessary maintenance.  2. Leg edema, right Rule out right lower limb DVT.  Right lower limb Doppler will be organized.  Strongly recommended that patient schedules an urgent visit with her primary care Dr. Shanon Ace.  Belinda Harper, it was a pleasure seeing you today!

## 2018-10-19 ENCOUNTER — Other Ambulatory Visit: Payer: Medicare Other

## 2018-10-22 ENCOUNTER — Ambulatory Visit: Payer: Medicare Other

## 2018-11-07 ENCOUNTER — Telehealth: Payer: Self-pay | Admitting: Internal Medicine

## 2018-11-07 DIAGNOSIS — I10 Essential (primary) hypertension: Secondary | ICD-10-CM

## 2018-11-07 NOTE — Telephone Encounter (Signed)
Medication Refill - Medication: amLODipine (NORVASC) 5 MG tablet [[id number]]    Has the patient contacted their pharmacy? Yes.   (Agent: If no, request that the patient contact the pharmacy for the refill.) (Agent: If yes, when and what did the pharmacy advise?)The pharmacy told the patient that she has no refills. She needs a new prescription. She called last weds but nothing has been done. She is own to her last pill  Preferred Pharmacy (with phone number or street name):  [place], [place] - [address] [phone] (Phone) [phone] (Fax)     Agent: Please be advised that RX refills may take up to 3 business days. We ask that you follow-up with your pharmacy.

## 2018-11-09 MED ORDER — AMLODIPINE BESYLATE 5 MG PO TABS: 5.0000 mg | ORAL_TABLET | Freq: Every day | ORAL | 1 refills | Status: DC

## 2018-11-09 NOTE — Telephone Encounter (Signed)
Refill was sent in 

## 2018-11-12 ENCOUNTER — Encounter: Payer: Self-pay | Admitting: Internal Medicine

## 2018-11-20 NOTE — Progress Notes (Signed)
Chief Complaint  Patient presents with  . Annual Exam    pt is concerned about blood pressure     HPI: Belinda Harper 78 y.o. comes in today for Preventive Medicare exam/ wellness visit .Since last visit. Last pv exam was June 19 Gyne  Check had swelling right leg improved coming and going  Has asymmetry  Again   bp machine   Here today  Today 153/83  ocass pulse reads as 33?  ocass no sx  Has reading from  Home  120 326 50 - 60 diastolic    afternonfluttering some no syncope x 12 19 nl edk  My chart  Getting some info  About asthma  She doesn't have asthma   No hemorrhoid bleeding  Taking iron  Every day still   Has been living at [facility] for isolation and loves it  Out side work in am  K2 hydration  Asks about skin  fam hx of melanoma   Health Maintenance  Topic Date Due  . INFLUENZA VACCINE  11/24/2018  . TETANUS/TDAP  06/23/2019  . DEXA SCAN  Completed  . PNA vac Low Risk Adult  Completed   Health Maintenance Review LIFESTYLE:  Exercise:  Gardening outside  Tobacco/ETS:n Alcohol: n Sugar beverages:not much  Sleep: ok  Drug use: no HH:  1   Hearing:  Ok   Vision:  No limitations at present . Last eye check UTD  Safety:  Has smoke detector and wears seat belts.  No firearms. No excess sun exposure. Sees dentist regularly.  Falls: n  Memory: Felt to be good  , no concern from her or her family.  Depression: No anhedonia unusual crying or depressive symptoms  Nutrition: Eats well balanced diet; adequate calcium and vitamin D. No swallowing chewing problems.  Injury: no major injuries in the last six months.  Other healthcare providers:  Reviewed today .  Preventive parameters:  Reviewed   ADLS:   There are no problems or need for assistance  driving, feeding, obtaining food, dressing, toileting and bathing, managing money using phone. She is independent.    ROS:  GEN/ HEENT: No fever, significant weight changes sweats headaches vision problems  hearing changes, CV/ PULM; No chest pain shortness of breath cough, syncope,edema  change in exercise tolerance. See above   [a;[itation at times  GI /GU: No adominal pain, vomiting, change in bowel habits. No blood in the stool. No significant GU symptoms. SKIN/HEME: ,no acute skin rashes suspicious lesions or bleeding. No lymphadenopathy, nodules, masses.  NEURO/ PSYCH:  No neurologic signs such as weakness numbness. No depression anxiety. IMM/ Allergy: No unusual infections.  Allergy .   REST of 12 system review negative except as per HPI   Past Medical History:  Diagnosis Date  . 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 ALLERGIC RHINITIS 01/03/2007  . 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 Anxiety   . ASTHMA 01/03/2007  . Blood transfusion 12 2010   hg 4 transfused 4 units  . DEPRESSION 01/03/2007   resolved  . Family history of adverse reaction to anesthesia    pt's mother as she gets older organs shut down   . FRACTURE, ANKLE, LEFT 04/10/2009  . Gastritis 2002  . GERD (gastroesophageal reflux disease)   . HEMORRHOIDS 05/19/2008  . HYPERCHOLESTEROLEMIA 05/19/2008  . Normal cardiac stress test    2014  . OSTEOARTHRITIS 01/03/2007  . OSTEOPOROSIS 01/03/2007   hx of actonbel use for at least  5 years   . Sepsis due to cellulitis ([place]) 02/05/2015  . Unspecified vitamin D deficiency 04/06/2007  . UTERINE PROLAPSE 04/06/2007    Family History  Problem Relation Age of Onset  . Hyperlipidemia Mother   . Heart disease Mother        CABG age 17  . Ovarian cancer Mother   . Uterine cancer Mother   . Arthritis Mother   . Osteoporosis Mother   . Heart disease Father        CABG age 15  . Arthritis Father   . Melanoma Father     Social History   Socioeconomic History  . Marital status: Divorced    Spouse name: Not on file  . Number of children: 2  . Years of education: Not on file  . Highest education level: Not on file  Occupational History  . Not on  file  Social Needs  . Financial resource strain: Not on file  . Food insecurity    Worry: Not on file    Inability: Not on file  . Transportation needs    Medical: Not on file    Non-medical: Not on file  Tobacco Use  . Smoking status: Never Smoker  . Smokeless tobacco: Never Used  Substance and Sexual Activity  . Alcohol use: Yes    Comment: rare  . Drug use: No  . Sexual activity: Not Currently    Comment: 1st intercourse- 18, partners- 1   Lifestyle  . Physical activity    Days per week: Not on file    Minutes per session: Not on file  . Stress: Not on file  Relationships  . Social Herbalist on phone: Not on file    Gets together: Not on file    Attends religious service: Not on file    Active member of club or organization: Not on file    Attends meetings of clubs or organizations: Not on file    Relationship status: Not on file  Other Topics Concern  . Not on file  Social History Narrative   HH of  1   To rent out room    Retired Dispensing optician   Never smoked    Pet cat allergic    Divorced   Regular exericise  walking mowing the lawnswimming   Silver sneakers     Outpatient Encounter Medications as of 11/21/2018  Medication Sig  . amLODipine (NORVASC) 5 MG tablet Take 1 tablet (5 mg total) by mouth daily.  . Calcium Citrate-Vitamin D (CITRACAL + D PO) Take 1 tablet by mouth daily with lunch. 500 mg calcium (2 X 250 mg)  . FERROUS GLUCONATE PO Take 162 mg by mouth at bedtime. 6 tablets X 27 mg elemental iron = 162 mg elemental iron (each tablet also contains 110 mg calcium)  . Magnesium 500 MG TABS Take 500 mg by mouth daily with lunch.   . Menatetrenone (VITAMIN K2) 100 MCG TABS Take 100 mcg by mouth daily.  . Multiple Vitamin (MULTIVITAMIN WITH MINERALS) TABS tablet Take 1 tablet by mouth daily. One a Day - Women's  . Multiple Vitamins-Minerals (HAIR/SKIN/NAILS/BIOTIN) TABS Take 1 tablet by mouth daily before lunch.  . vitamin C (ASCORBIC ACID)  500 MG tablet Take 500 mg by mouth at bedtime.   [name] acetaminophen (TYLENOL) 500 MG tablet Take 1,000 mg by mouth every 6 (six) hours as needed (pain).   Facility-Administered Encounter Medications as of 11/21/2018  Medication  .  cloNIDine (CATAPRES) tablet 0.1 mg    EXAM:  BP (!) 146/82 (BP Location: Right Arm, Patient Position: Sitting, Cuff Size: Normal)   Pulse 72   Temp 98.5 F (36.9 C) (Oral)   Ht 5\' 4"  (1.626 m)   Wt 155 lb 9.6 oz (70.6 kg)   SpO2 99%   BMI 26.71 kg/m   Body mass index is 26.71 kg/m.  Physical Exam: Vital signs reviewed VEL:FYBO is a well-developed well-nourished alert cooperative   who appears stated age in no acute distress.  HEENT: normocephalic atraumatic , Eyes: PERRL EOM's full, conjunctiva clear, Nares: paten,t no deformity discharge or tenderness., Ears: no deformity EAC's clear TMs with normal landmarks. Mouth: masked  NECK: supple without masses, thyromegaly or bruits. CHEST/PULM:  Clear to auscultation and percussion breath sounds equal no wheeze , rales or rhonchi. No chest wall deformities or tenderness.Breast: normal by inspection . No dimpling, discharge, masses, tenderness or discharge . CV: PMI is nondisplaced, S1 S2 no gallops, slight murmur  Non radiation  murmurs, rubs. Peripheral pulses are full without delay.No JVD .  ABDOMEN: Bowel sounds normal nontender  No guard or rebound, no hepato splenomegal no CVA tenderness.   Extremtities:  No clubbing cyanosis or edema, no acute joint swelling or redness no focal atrophy VV noted bilateral right more than left   No ulcers  Peripheral  Redness  NEURO:  Oriented x3, cranial nerves 3-12 appear to be intact, no obvious focal weakness SKIN: No acute rashes normal turgor, color, no bruising or petechiae.x above  Sun changes no alarm lesions PSYCH: Oriented, good eye contact, no obvious depression anxiety, cognition and judgment appear normal. LN: no cervical axillary inguinal adenopathy No noted  deficits in memory, attention, and speech.  Last ekg 2019 no acute finding  Lab Results  Component Value Date   WBC 3.8 (L) 11/21/2018   HGB 10.5 (L) 11/21/2018   HCT 33.0 (L) 11/21/2018   PLT 205.0 11/21/2018   GLUCOSE 82 11/21/2018   CHOL 227 (H) 11/21/2018   TRIG 83.0 11/21/2018   HDL 71.90 11/21/2018   LDLDIRECT 141.9 08/06/2012   LDLCALC 138 (H) 11/21/2018   ALT 14 11/21/2018   AST 24 11/21/2018   NA 140 11/21/2018   K 4.7 11/21/2018   CL 103 11/21/2018   CREATININE 0.82 11/21/2018   BUN 21 11/21/2018   CO2 31 11/21/2018   TSH 1.23 11/21/2018   INR 1.04 04/10/2009   HGBA1C 5.6 11/21/2018    ASSESSMENT AND PLAN:  Discussed the following assessment and plan:   ICD-10-CM   1. Visit for preventive health examination  Z00.00   2. Medication management  Z79.899 TSH    Basic metabolic panel    CBC with Differential/Platelet    Hemoglobin A1c    Hepatic function panel    TSH    T4, free    Lipid panel    CANCELED: CBC with Differential/Platelet  3. Hypertension, unspecified type  I10 TSH    Basic metabolic panel    CBC with Differential/Platelet    Hemoglobin A1c    Hepatic function panel    TSH    T4, free    Lipid panel    CANCELED: CBC with Differential/Platelet  4. Iron deficiency anemia, unspecified iron deficiency anemia type  D50.9 TSH    Basic metabolic panel    CBC with Differential/Platelet    Hemoglobin A1c    Hepatic function panel    TSH    T4, free  Lipid panel    CANCELED: CBC with Differential/Platelet  5. Hyperlipidemia, unspecified hyperlipidemia type  E78.5 TSH    Basic metabolic panel    CBC with Differential/Platelet    Hemoglobin A1c    Hepatic function panel    TSH    T4, free    Lipid panel    CANCELED: CBC with Differential/Platelet  6. Intermittent palpitations  R00.2 TSH    Basic metabolic panel    CBC with Differential/Platelet    Hemoglobin A1c    Hepatic function panel    TSH    T4, free    Lipid panel     CANCELED: CBC with Differential/Platelet   Patient Care Team: Panosh, Standley Brooking, MD as PCP - General Servando Salina, MD (Obstetrics and Gynecology) Clent Jacks, MD (Ophthalmology) Mikey Bussing, DDS (Dentistry) Druscilla Brownie, MD as Consulting Physician (Dermatology) [name], MD as Consulting Physician (Hematology and Oncology) Gaynelle Arabian, MD as Consulting Physician (Orthopedic Surgery) reviewed readings and bp monitor  is good enough to decide on managment  If lwo will dec dosing   Let us know if  Pulse  Low or sx   Disc how to take pulse  And if irregular  Continue   monitoring    Patient Instructions  Check pulses to make sure ok  Contact  us  If  Below 45  Checking rate for a full minute .  Lab today  We can consider  Trying the iron every other day . As we discussed    Your monitor is good enough for  Monitoring . And if low reading  100 and below we may advise decreaseing med to 2.5 mg per day  Of amlodipine   Stay hydrated   We can consdier getting  More evaluation if palpitaitions ongoing but  If  Doing well  Will follow  [facility] dermatology  May be  Open to new patients.       Health Maintenance, Female Adopting a healthy lifestyle and getting preventive care are important in promoting health and wellness. Ask your health care provider about:  The right schedule for you to have regular tests and exams.  Things you can do on your own to prevent diseases and keep yourself healthy. What should I know about diet, weight, and exercise? Eat a healthy diet   Eat a diet that includes plenty of vegetables, fruits, low-fat dairy products, and lean protein.  Do not eat a lot of foods that are high in solid fats, added sugars, or sodium. Maintain a healthy weight Body mass index (BMI) is used to identify weight problems. It estimates body fat based on height and weight. Your health care provider can help determine your BMI and help you achieve or maintain  a healthy weight. Get regular exercise Get regular exercise. This is one of the most important things you can do for your health. Most adults should:  Exercise for at least 150 minutes each week. The exercise should increase your heart rate and make you sweat (moderate-intensity exercise).  Do strengthening exercises at least twice a week. This is in addition to the moderate-intensity exercise.  Spend less time sitting. Even light physical activity can be beneficial. Watch cholesterol and blood lipids Have your blood tested for lipids and cholesterol at 77 years of age, then have this test every 5 years. Have your cholesterol levels checked more often if:  Your lipid or cholesterol levels are high.  You are older than 77 years of age.  You are at high risk for heart disease. What should I know about cancer screening? Depending on your health history and family history, you may need to have cancer screening at various ages. This may include screening for:  Breast cancer.  Cervical cancer.  Colorectal cancer.  Skin cancer.  Lung cancer. What should I know about heart disease, diabetes, and high blood pressure? Blood pressure and heart disease  High blood pressure causes heart disease and increases the risk of stroke. This is more likely to develop in people who have high blood pressure readings, are of African descent, or are overweight.  Have your blood pressure checked: ? Every 3-5 years if you are 59-50 years of age. ? Every year if you are 40 years old or older. Diabetes Have regular diabetes screenings. This checks your fasting blood sugar level. Have the screening done:  Once every three years after age 67 if you are at a normal weight and have a low risk for diabetes.  More often and at a younger age if you are overweight or have a high risk for diabetes. What should I know about preventing infection? Hepatitis B If you have a higher risk for hepatitis B, you should  be screened for this virus. Talk with your health care provider to find out if you are at risk for hepatitis B infection. Hepatitis C Testing is recommended for:  Everyone born from [birth year] through 1964.  Anyone with known risk factors for hepatitis C. Sexually transmitted infections (STIs)  Get screened for STIs, including gonorrhea and chlamydia, if: ? You are sexually active and are younger than 77 years of age. ? You are older than 77 years of age and your health care provider tells you that you are at risk for this type of infection. ? Your sexual activity has changed since you were last screened, and you are at increased risk for chlamydia or gonorrhea. Ask your health care provider if you are at risk.  Ask your health care provider about whether you are at high risk for HIV. Your health care provider may recommend a prescription medicine to help prevent HIV infection. If you choose to take medicine to prevent HIV, you should first get tested for HIV. You should then be tested every 3 months for as long as you are taking the medicine. Pregnancy  If you are about to stop having your period (premenopausal) and you may become pregnant, seek counseling before you get pregnant.  Take 400 to 800 micrograms (mcg) of folic acid every day if you become pregnant.  Ask for birth control (contraception) if you want to prevent pregnancy. Osteoporosis and menopause Osteoporosis is a disease in which the bones lose minerals and strength with aging. This can result in bone fractures. If you are 32 years old or older, or if you are at risk for osteoporosis and fractures, ask your health care provider if you should:  Be screened for bone loss.  Take a calcium or vitamin D supplement to lower your risk of fractures.  Be given hormone replacement therapy (HRT) to treat symptoms of menopause. Follow these instructions at home: Lifestyle  Do not use any products that contain nicotine or tobacco, such  as cigarettes, e-cigarettes, and chewing tobacco. If you need help quitting, ask your health care provider.  Do not use street drugs.  Do not share needles.  Ask your health care provider for help if you need support or information about quitting drugs. Alcohol use  Do  not drink alcohol if: ? Your health care provider tells you not to drink. ? You are pregnant, may be pregnant, or are planning to become pregnant.  If you drink alcohol: ? Limit how much you use to 0-1 drink a day. ? Limit intake if you are breastfeeding.  Be aware of how much alcohol is in your drink. In the [place] one drink equals one 12 oz bottle of beer (355 mL), one 5 oz glass of wine (148 mL), or one 1 oz glass of hard liquor (44 mL). General instructions  Schedule regular health, dental, and eye exams.  Stay current with your vaccines.  Tell your health care provider if: ? You often feel depressed. ? You have ever been abused or do not feel safe at home. Summary  Adopting a healthy lifestyle and getting preventive care are important in promoting health and wellness.  Follow your health care provider's instructions about healthy diet, exercising, and getting tested or screened for diseases.  Follow your health care provider's instructions on monitoring your cholesterol and blood pressure. This information is not intended to replace advice given to you by your health care provider. Make sure you discuss any questions you have with your health care provider. Document Released: 10/25/2010 Document Revised: 04/04/2018 Document Reviewed: 04/04/2018 Elsevier Patient Education  2020 [place] Panosh M.D.

## 2018-11-21 ENCOUNTER — Ambulatory Visit: Payer: Medicare Other

## 2018-11-21 ENCOUNTER — Ambulatory Visit: Payer: Medicare Other | Admitting: Internal Medicine

## 2018-11-21 ENCOUNTER — Other Ambulatory Visit: Payer: Self-pay

## 2018-11-21 ENCOUNTER — Encounter: Payer: Self-pay | Admitting: Internal Medicine

## 2018-11-21 ENCOUNTER — Ambulatory Visit (INDEPENDENT_AMBULATORY_CARE_PROVIDER_SITE_OTHER): Payer: Medicare Other | Admitting: Internal Medicine

## 2018-11-21 VITALS — BP 146/82 | HR 72 | Temp 98.5°F | Ht 64.0 in | Wt 155.6 lb

## 2018-11-21 DIAGNOSIS — I10 Essential (primary) hypertension: Secondary | ICD-10-CM | POA: Diagnosis not present

## 2018-11-21 DIAGNOSIS — Z79899 Other long term (current) drug therapy: Secondary | ICD-10-CM

## 2018-11-21 DIAGNOSIS — R002 Palpitations: Secondary | ICD-10-CM

## 2018-11-21 DIAGNOSIS — D509 Iron deficiency anemia, unspecified: Secondary | ICD-10-CM

## 2018-11-21 DIAGNOSIS — Z0000 Encounter for general adult medical examination without abnormal findings: Secondary | ICD-10-CM | POA: Diagnosis not present

## 2018-11-21 DIAGNOSIS — E785 Hyperlipidemia, unspecified: Secondary | ICD-10-CM

## 2018-11-21 LAB — HEPATIC FUNCTION PANEL
ALT: 14 U/L (ref 0–35)
AST: 24 U/L (ref 0–37)
Albumin: 4.4 g/dL (ref 3.5–5.2)
Alkaline Phosphatase: 86 U/L (ref 39–117)
Bilirubin, Direct: 0.1 mg/dL (ref 0.0–0.3)
Total Bilirubin: 0.4 mg/dL (ref 0.2–1.2)
Total Protein: 7.6 g/dL (ref 6.0–8.3)

## 2018-11-21 LAB — CBC WITH DIFFERENTIAL/PLATELET
Basophils Absolute: 0.1 10*3/uL (ref 0.0–0.1)
Basophils Relative: 1.4 % (ref 0.0–3.0)
Eosinophils Absolute: 0.2 10*3/uL (ref 0.0–0.7)
Eosinophils Relative: 4.9 % (ref 0.0–5.0)
HCT: 33 % — ABNORMAL LOW (ref 36.0–46.0)
Hemoglobin: 10.5 g/dL — ABNORMAL LOW (ref 12.0–15.0)
Lymphocytes Relative: 33.6 % (ref 12.0–46.0)
Lymphs Abs: 1.3 10*3/uL (ref 0.7–4.0)
MCHC: 31.8 g/dL (ref 30.0–36.0)
MCV: 83.4 fl (ref 78.0–100.0)
Monocytes Absolute: 0.3 10*3/uL (ref 0.1–1.0)
Monocytes Relative: 7.2 % (ref 3.0–12.0)
Neutro Abs: 2 10*3/uL (ref 1.4–7.7)
Neutrophils Relative %: 52.9 % (ref 43.0–77.0)
Platelets: 205 10*3/uL (ref 150.0–400.0)
RBC: 3.96 Mil/uL (ref 3.87–5.11)
RDW: 17.6 % — ABNORMAL HIGH (ref 11.5–15.5)
WBC: 3.8 10*3/uL — ABNORMAL LOW (ref 4.0–10.5)

## 2018-11-21 LAB — LIPID PANEL
Cholesterol: 227 mg/dL — ABNORMAL HIGH (ref 0–200)
HDL: 71.9 mg/dL (ref >39.00)
LDL Cholesterol: 138 mg/dL — ABNORMAL HIGH (ref 0–99)
NonHDL: 154.68
Total CHOL/HDL Ratio: 3
Triglycerides: 83 mg/dL (ref 0.0–149.0)
VLDL: 16.6 mg/dL (ref 0.0–40.0)

## 2018-11-21 LAB — BASIC METABOLIC PANEL
BUN: 21 mg/dL (ref 6–23)
CO2: 31 mEq/L (ref 19–32)
Calcium: 9.6 mg/dL (ref 8.4–10.5)
Chloride: 103 mEq/L (ref 96–112)
Creatinine, Ser: 0.82 mg/dL (ref 0.40–1.20)
GFR: 67.63 mL/min (ref >60.00)
Glucose, Bld: 82 mg/dL (ref 70–99)
Potassium: 4.7 mEq/L (ref 3.5–5.1)
Sodium: 140 mEq/L (ref 135–145)

## 2018-11-21 LAB — HEMOGLOBIN A1C: Hgb A1c MFr Bld: 5.6 % (ref 4.6–6.5)

## 2018-11-21 LAB — TSH: TSH: 1.23 u[IU]/mL (ref 0.35–4.50)

## 2018-11-21 LAB — T4, FREE: Free T4: 0.96 ng/dL (ref 0.60–1.60)

## 2018-11-21 NOTE — Patient Instructions (Addendum)
Check pulses to make sure ok  Contact  us  If  Below 45  Checking rate for a full minute .  Lab today  We can consider  Trying the iron every other day . As we discussed    Your monitor is good enough for  Monitoring . And if low reading  100 and below we may advise decreaseing med to 2.5 mg per day  Of amlodipine   Stay hydrated   We can consdier getting  More evaluation if palpitaitions ongoing but  If  Doing well  Will follow  [facility] dermatology  May be  Open to new patients.       Health Maintenance, Female Adopting a healthy lifestyle and getting preventive care are important in promoting health and wellness. Ask your health care provider about:  The right schedule for you to have regular tests and exams.  Things you can do on your own to prevent diseases and keep yourself healthy. What should I know about diet, weight, and exercise? Eat a healthy diet   Eat a diet that includes plenty of vegetables, fruits, low-fat dairy products, and lean protein.  Do not eat a lot of foods that are high in solid fats, added sugars, or sodium. Maintain a healthy weight Body mass index (BMI) is used to identify weight problems. It estimates body fat based on height and weight. Your health care provider can help determine your BMI and help you achieve or maintain a healthy weight. Get regular exercise Get regular exercise. This is one of the most important things you can do for your health. Most adults should:  Exercise for at least 150 minutes each week. The exercise should increase your heart rate and make you sweat (moderate-intensity exercise).  Do strengthening exercises at least twice a week. This is in addition to the moderate-intensity exercise.  Spend less time sitting. Even light physical activity can be beneficial. Watch cholesterol and blood lipids Have your blood tested for lipids and cholesterol at 77 years of age, then have this test every 5 years. Have your cholesterol  levels checked more often if:  Your lipid or cholesterol levels are high.  You are older than 77 years of age.  You are at high risk for heart disease. What should I know about cancer screening? Depending on your health history and family history, you may need to have cancer screening at various ages. This may include screening for:  Breast cancer.  Cervical cancer.  Colorectal cancer.  Skin cancer.  Lung cancer. What should I know about heart disease, diabetes, and high blood pressure? Blood pressure and heart disease  High blood pressure causes heart disease and increases the risk of stroke. This is more likely to develop in people who have high blood pressure readings, are of African descent, or are overweight.  Have your blood pressure checked: ? Every 3-5 years if you are 103-6 years of age. ? Every year if you are 44 years old or older. Diabetes Have regular diabetes screenings. This checks your fasting blood sugar level. Have the screening done:  Once every three years after age 59 if you are at a normal weight and have a low risk for diabetes.  More often and at a younger age if you are overweight or have a high risk for diabetes. What should I know about preventing infection? Hepatitis B If you have a higher risk for hepatitis B, you should be screened for this virus. Talk with your health  care provider to find out if you are at risk for hepatitis B infection. Hepatitis C Testing is recommended for:  Everyone born from [birth year] through 1964.  Anyone with known risk factors for hepatitis C. Sexually transmitted infections (STIs)  Get screened for STIs, including gonorrhea and chlamydia, if: ? You are sexually active and are younger than 77 years of age. ? You are older than 77 years of age and your health care provider tells you that you are at risk for this type of infection. ? Your sexual activity has changed since you were last screened, and you are at increased  risk for chlamydia or gonorrhea. Ask your health care provider if you are at risk.  Ask your health care provider about whether you are at high risk for HIV. Your health care provider may recommend a prescription medicine to help prevent HIV infection. If you choose to take medicine to prevent HIV, you should first get tested for HIV. You should then be tested every 3 months for as long as you are taking the medicine. Pregnancy  If you are about to stop having your period (premenopausal) and you may become pregnant, seek counseling before you get pregnant.  Take 400 to 800 micrograms (mcg) of folic acid every day if you become pregnant.  Ask for birth control (contraception) if you want to prevent pregnancy. Osteoporosis and menopause Osteoporosis is a disease in which the bones lose minerals and strength with aging. This can result in bone fractures. If you are 64 years old or older, or if you are at risk for osteoporosis and fractures, ask your health care provider if you should:  Be screened for bone loss.  Take a calcium or vitamin D supplement to lower your risk of fractures.  Be given hormone replacement therapy (HRT) to treat symptoms of menopause. Follow these instructions at home: Lifestyle  Do not use any products that contain nicotine or tobacco, such as cigarettes, e-cigarettes, and chewing tobacco. If you need help quitting, ask your health care provider.  Do not use street drugs.  Do not share needles.  Ask your health care provider for help if you need support or information about quitting drugs. Alcohol use  Do not drink alcohol if: ? Your health care provider tells you not to drink. ? You are pregnant, may be pregnant, or are planning to become pregnant.  If you drink alcohol: ? Limit how much you use to 0-1 drink a day. ? Limit intake if you are breastfeeding.  Be aware of how much alcohol is in your drink. In the [place] one drink equals one 12 oz bottle of beer  (355 mL), one 5 oz glass of wine (148 mL), or one 1 oz glass of hard liquor (44 mL). General instructions  Schedule regular health, dental, and eye exams.  Stay current with your vaccines.  Tell your health care provider if: ? You often feel depressed. ? You have ever been abused or do not feel safe at home. Summary  Adopting a healthy lifestyle and getting preventive care are important in promoting health and wellness.  Follow your health care provider's instructions about healthy diet, exercising, and getting tested or screened for diseases.  Follow your health care provider's instructions on monitoring your cholesterol and blood pressure. This information is not intended to replace advice given to you by your health care provider. Make sure you discuss any questions you have with your health care provider. Document Released: 10/25/2010 Document Revised: 04/04/2018  Document Reviewed: 04/04/2018 Elsevier Patient Education  [employer].

## 2018-11-27 ENCOUNTER — Other Ambulatory Visit: Payer: Self-pay

## 2018-11-27 DIAGNOSIS — D509 Iron deficiency anemia, unspecified: Secondary | ICD-10-CM

## 2019-02-12 ENCOUNTER — Other Ambulatory Visit: Payer: Self-pay

## 2019-02-13 ENCOUNTER — Ambulatory Visit (INDEPENDENT_AMBULATORY_CARE_PROVIDER_SITE_OTHER): Payer: Medicare Other | Admitting: Obstetrics & Gynecology

## 2019-02-13 ENCOUNTER — Encounter: Payer: Self-pay | Admitting: Obstetrics & Gynecology

## 2019-02-13 VITALS — BP 136/72

## 2019-02-13 DIAGNOSIS — Z4689 Encounter for fitting and adjustment of other specified devices: Secondary | ICD-10-CM

## 2019-02-13 NOTE — Progress Notes (Signed)
    Belinda Harper [birth year] [id number]        78 y.o.  [id number]   RP: Pessary maintenance  HPI: Well on Milex ring #6 with support.  Only complaint is the mild continuous yellowish vaginal discharge.  No vaginal bleeding.  No pelvic pain, no vaginal pain.  Mild urgency when the bladder is full, but no significant incontinence of urine.     OB History  Gravida Para Term Preterm AB Living  3 2     1 2   SAB TAB Ectopic Multiple Live Births  1            # Outcome Date GA Lbr Len/2nd Weight Sex Delivery Anes PTL Lv  3 SAB           2 Para           1 Para             Past medical history,surgical history, problem list, medications, allergies, family history and social history were all reviewed and documented in the EPIC chart.   Directed ROS with pertinent positives and negatives documented in the history of present illness/assessment and plan.  Exam:  Vitals:   02/13/19 1511  BP: 136/72   General appearance:  Normal  Gynecologic exam: Vulva normal.  Pessary removed easily and cleaned.  Mild yellowish vaginal discharge.  No ulceration of the vaginal mucosa.  No bleeding.  Pessary put back in place easily.     Assessment/Plan:  78 y.o. [id number]   1. Encounter for pessary maintenance Well with Milex ring #6 with support, but mild vaginal discharge.  A Milex ring #5 with support might fit better in the future.  Patient declined pessary refitting today.  Follow-up in 4 months for pessary maintenance.  Counseling on above issues and coordination of care more than 50% for 15 minutes.  [name] MD, 3:19 PM 02/13/2019

## 2019-02-14 ENCOUNTER — Encounter: Payer: Self-pay | Admitting: Obstetrics & Gynecology

## 2019-02-14 NOTE — Patient Instructions (Signed)
1. Encounter for pessary maintenance Well with Milex ring #6 with support, but mild vaginal discharge.  A Milex ring #5 with support might fit better in the future.  Patient declined pessary refitting today.  Follow-up in 4 months for pessary maintenance.  Mardella, it was a pleasure seeing you today!

## 2019-03-25 ENCOUNTER — Other Ambulatory Visit: Payer: Self-pay

## 2019-03-25 ENCOUNTER — Other Ambulatory Visit (INDEPENDENT_AMBULATORY_CARE_PROVIDER_SITE_OTHER): Payer: Medicare Other

## 2019-03-25 DIAGNOSIS — D509 Iron deficiency anemia, unspecified: Secondary | ICD-10-CM | POA: Diagnosis not present

## 2019-03-25 LAB — CBC WITH DIFFERENTIAL/PLATELET
Basophils Absolute: 0 10*3/uL (ref 0.0–0.1)
Basophils Relative: 1.2 % (ref 0.0–3.0)
Eosinophils Absolute: 0.1 10*3/uL (ref 0.0–0.7)
Eosinophils Relative: 4.7 % (ref 0.0–5.0)
HCT: 29.7 % — ABNORMAL LOW (ref 36.0–46.0)
Hemoglobin: 9.4 g/dL — ABNORMAL LOW (ref 12.0–15.0)
Lymphocytes Relative: 36.4 % (ref 12.0–46.0)
Lymphs Abs: 1 10*3/uL (ref 0.7–4.0)
MCHC: 31.6 g/dL (ref 30.0–36.0)
MCV: 82.9 fl (ref 78.0–100.0)
Monocytes Absolute: 0.3 10*3/uL (ref 0.1–1.0)
Monocytes Relative: 10.8 % (ref 3.0–12.0)
Neutro Abs: 1.3 10*3/uL — ABNORMAL LOW (ref 1.4–7.7)
Neutrophils Relative %: 46.9 % (ref 43.0–77.0)
Platelets: 252 10*3/uL (ref 150.0–400.0)
RBC: 3.58 Mil/uL — ABNORMAL LOW (ref 3.87–5.11)
RDW: 16.6 % — ABNORMAL HIGH (ref 11.5–15.5)
WBC: 2.9 10*3/uL — ABNORMAL LOW (ref 4.0–10.5)

## 2019-03-26 ENCOUNTER — Telehealth (INDEPENDENT_AMBULATORY_CARE_PROVIDER_SITE_OTHER): Payer: Medicare Other | Admitting: Internal Medicine

## 2019-03-26 ENCOUNTER — Encounter: Payer: Self-pay | Admitting: Internal Medicine

## 2019-03-26 DIAGNOSIS — D509 Iron deficiency anemia, unspecified: Secondary | ICD-10-CM | POA: Diagnosis not present

## 2019-03-26 NOTE — Progress Notes (Signed)
There is no work phone number on file.   Virtual Visit via Telephone Note  I connected with@ on 03/26/19 at  3:00 PM EST by telephone and verified that I am speaking with the correct person using two identifiers.   I discussed the limitations, risks, security and privacy concerns of performing an evaluation and management service by telephone and the availability of in person appointments. I also discussed with the patient that there may be a patient responsible charge related to this service. The patient expressed understanding and agreed to proceed.  Location patient: home Location provider: work  office Participants present for the call: patient, provider Patient did not have a visit in the prior 7 days to address this/these issue(s).   History of Present Illness: Belinda Harper presents for follow-up after her blood count lab results. She has a long standing history of iron deficiency anemia history of transfusion and iron infusions. 's at some point it was felt to be related to hemorrhoidal bleeding She has followed by hematology in the past but not for the last 4 years. She has been taking her regular iron with vitamin C. At last visit we were trying the every other day for absorption potential. She has very little hemorrhoidal bleeding no syncope lightheadedness or other worries except for stress related to external factors.  No cp sob     Observations/Objective: Patient sounds personable and well on the phone. I do not appreciate any SOB. Speech and thought processing are grossly intact. Patient reported vitals:  Assessment and Plan:  Since no serious active bleeding or hemodynamic symptoms I have agreed to let her go 2 more months back to the daily iron with vitamin C. Lab in 2 months if not improved or dropping will consult with hematology. She states she did not think iron infusions helped in the past but it has been quite a while. She is working on [employer]  Up Instructions:  Blood work in 2 months  Or as needed in iinterim       [id number] [id number] [id number] 11-20 [id number] 21-30 I did not refer this patient for an OV in the next 24 hours for this/these issue(s).  I discussed the assessment and treatment plan with the patient. The patient was provided an opportunity to ask questions and all were answered. The patient agreed with the plan and demonstrated an understanding of the instructions.   The patient was advised to call back or seek an in-person evaluation if the symptoms worsen or if the condition fails to improve as anticipated.  I provided 11 minutes of non-face-to-face time during this encounter. No follow-ups on file.  Shanon Ace, MD

## 2019-04-25 ENCOUNTER — Other Ambulatory Visit: Payer: Self-pay | Admitting: Internal Medicine

## 2019-04-25 DIAGNOSIS — I10 Essential (primary) hypertension: Secondary | ICD-10-CM

## 2019-05-21 ENCOUNTER — Other Ambulatory Visit: Payer: Self-pay

## 2019-05-22 ENCOUNTER — Other Ambulatory Visit (INDEPENDENT_AMBULATORY_CARE_PROVIDER_SITE_OTHER): Payer: Medicare Other

## 2019-05-22 DIAGNOSIS — D509 Iron deficiency anemia, unspecified: Secondary | ICD-10-CM

## 2019-05-22 LAB — CBC WITH DIFFERENTIAL/PLATELET
Basophils Absolute: 0 10*3/uL (ref 0.0–0.1)
Basophils Relative: 0.9 % (ref 0.0–3.0)
Eosinophils Absolute: 0.2 10*3/uL (ref 0.0–0.7)
Eosinophils Relative: 5.2 % — ABNORMAL HIGH (ref 0.0–5.0)
HCT: 35.7 % — ABNORMAL LOW (ref 36.0–46.0)
Hemoglobin: 11.3 g/dL — ABNORMAL LOW (ref 12.0–15.0)
Lymphocytes Relative: 26.7 % (ref 12.0–46.0)
Lymphs Abs: 1.2 10*3/uL (ref 0.7–4.0)
MCHC: 31.7 g/dL (ref 30.0–36.0)
MCV: 81.5 fl (ref 78.0–100.0)
Monocytes Absolute: 0.4 10*3/uL (ref 0.1–1.0)
Monocytes Relative: 9.5 % (ref 3.0–12.0)
Neutro Abs: 2.6 10*3/uL (ref 1.4–7.7)
Neutrophils Relative %: 57.7 % (ref 43.0–77.0)
Platelets: 209 10*3/uL (ref 150.0–400.0)
RBC: 4.38 Mil/uL (ref 3.87–5.11)
RDW: 17.6 % — ABNORMAL HIGH (ref 11.5–15.5)
WBC: 4.5 10*3/uL (ref 4.0–10.5)

## 2019-05-22 LAB — IBC + FERRITIN
Ferritin: 30.2 ng/mL (ref 10.0–291.0)
Iron: 43 ug/dL (ref 42–145)
Saturation Ratios: 12.7 % — ABNORMAL LOW (ref 20.0–50.0)
Transferrin: 242 mg/dL (ref 212.0–360.0)

## 2019-05-23 ENCOUNTER — Other Ambulatory Visit: Payer: Self-pay

## 2019-05-23 DIAGNOSIS — D509 Iron deficiency anemia, unspecified: Secondary | ICD-10-CM

## 2019-05-23 NOTE — Progress Notes (Signed)
Anemia  much better!  continue taking the iron the way you are doing now And repeat cbcdiff  in about 6 months  to make sure stable

## 2019-06-07 ENCOUNTER — Ambulatory Visit: Payer: Medicare Other | Attending: Internal Medicine

## 2019-06-07 DIAGNOSIS — Z20822 Contact with and (suspected) exposure to covid-19: Secondary | ICD-10-CM

## 2019-06-08 LAB — NOVEL CORONAVIRUS, NAA: SARS-CoV-2, NAA: NOT DETECTED

## 2019-06-18 ENCOUNTER — Other Ambulatory Visit: Payer: Self-pay

## 2019-06-19 ENCOUNTER — Encounter: Payer: Self-pay | Admitting: Obstetrics & Gynecology

## 2019-06-19 ENCOUNTER — Ambulatory Visit (INDEPENDENT_AMBULATORY_CARE_PROVIDER_SITE_OTHER): Payer: Medicare Other | Admitting: Obstetrics & Gynecology

## 2019-06-19 VITALS — BP 126/80

## 2019-06-19 DIAGNOSIS — Z4689 Encounter for fitting and adjustment of other specified devices: Secondary | ICD-10-CM | POA: Diagnosis not present

## 2019-06-19 DIAGNOSIS — N814 Uterovaginal prolapse, unspecified: Secondary | ICD-10-CM | POA: Diagnosis not present

## 2019-06-19 NOTE — Progress Notes (Signed)
    Belinda Harper 11/24/1940 [id number]        79 y.o.  [id number]   RP: Pessary maintenance and fitting  HPI: Would like to see if a pessary 1 size smaller would work.  Currently using a Milex ring #6 with support.  The main problem with that size pessary is that the patient is having a hard time removing it because of discomfort and pain.   OB History  Gravida Para Term Preterm AB Living  3 2     1 2   SAB TAB Ectopic Multiple Live Births  1            # Outcome Date GA Lbr Len/2nd Weight Sex Delivery Anes PTL Lv  3 SAB           2 Para           1 Para             Past medical history,surgical history, problem list, medications, allergies, family history and social history were all reviewed and documented in the EPIC chart.   Directed ROS with pertinent positives and negatives documented in the history of present illness/assessment and plan.  Exam:  Vitals:   06/19/19 1526  BP: 126/80   General appearance:  Normal  Abdomen: Normal  Gynecologic exam: Vulva normal.  Pessary removed.  The vaginal mucosa was intact with no inflammation or ulceration.  The pessary was cleaned.  A Milex ring #5 with support was easily inserted.  It felt like it was a good fit.  Patient tried it for a while with Valsalva and squatting.  The pessary stayed in good position.  Removal of the pessary was easier at that size.   Assessment/Plan:  79 y.o. [id number]   1. Encounter for fitting and adjustment of pessary A smaller Milex ring with support was fitted today, and #5 instead of a #6.  It felt like a good fit on exam and it did not come out with increased pressure with Valsalva and squatting.  Will order the Milex ring #5 and will have patient come back for insertion at her convenience.  Other orders - ferrous gluconate (FERGON) 324 MG tablet; Take 324 mg by mouth daily with breakfast. Taking 7 tablets  [name] MD, 4:06 PM 06/19/2019

## 2019-06-20 ENCOUNTER — Encounter: Payer: Self-pay | Admitting: Gynecology

## 2019-06-22 ENCOUNTER — Encounter: Payer: Self-pay | Admitting: Obstetrics & Gynecology

## 2019-06-22 NOTE — Patient Instructions (Signed)
1. Encounter for fitting and adjustment of pessary A smaller Milex ring with support was fitted today, and #5 instead of a #6.  It felt like a good fit on exam and it did not come out with increased pressure with Valsalva and squatting.  Will order the Milex ring #5 and will have patient come back for insertion at her convenience.  Other orders - ferrous gluconate (FERGON) 324 MG tablet; Take 324 mg by mouth daily with breakfast. Taking 7 tablets  Belinda Harper, it was a pleasure seeing you today!

## 2019-07-29 ENCOUNTER — Other Ambulatory Visit: Payer: Self-pay | Admitting: Internal Medicine

## 2019-07-29 DIAGNOSIS — I10 Essential (primary) hypertension: Secondary | ICD-10-CM

## 2019-09-04 NOTE — Telephone Encounter (Signed)
Yes please  Send in order for dexa ( 2 years out ) to  Place of her previous dexa

## 2019-09-05 ENCOUNTER — Other Ambulatory Visit: Payer: Self-pay

## 2019-09-05 DIAGNOSIS — M810 Age-related osteoporosis without current pathological fracture: Secondary | ICD-10-CM

## 2019-09-11 ENCOUNTER — Other Ambulatory Visit: Payer: Self-pay

## 2019-09-11 DIAGNOSIS — M810 Age-related osteoporosis without current pathological fracture: Secondary | ICD-10-CM

## 2019-10-02 ENCOUNTER — Telehealth: Payer: Self-pay

## 2019-10-02 NOTE — Telephone Encounter (Signed)
Received fax from [place] for patient. Placed in red folder. Please return when completed. Thank you!

## 2019-10-04 NOTE — Telephone Encounter (Signed)
Signed  for  dexa

## 2019-10-16 ENCOUNTER — Ambulatory Visit: Payer: Medicare Other | Admitting: Obstetrics & Gynecology

## 2019-10-17 ENCOUNTER — Encounter: Payer: Self-pay | Admitting: Internal Medicine

## 2019-10-17 LAB — HM MAMMOGRAPHY

## 2019-10-17 LAB — HM DEXA SCAN: HM Dexa Scan: 0.543

## 2019-10-23 ENCOUNTER — Other Ambulatory Visit: Payer: Self-pay

## 2019-10-23 ENCOUNTER — Encounter: Payer: Self-pay | Admitting: Obstetrics & Gynecology

## 2019-10-23 ENCOUNTER — Ambulatory Visit (INDEPENDENT_AMBULATORY_CARE_PROVIDER_SITE_OTHER): Payer: Medicare Other | Admitting: Obstetrics & Gynecology

## 2019-10-23 VITALS — BP 130/80

## 2019-10-23 DIAGNOSIS — Z469 Encounter for fitting and adjustment of unspecified device: Secondary | ICD-10-CM | POA: Diagnosis not present

## 2019-10-23 DIAGNOSIS — T8389XA Other specified complication of genitourinary prosthetic devices, implants and grafts, initial encounter: Secondary | ICD-10-CM

## 2019-10-23 DIAGNOSIS — N814 Uterovaginal prolapse, unspecified: Secondary | ICD-10-CM

## 2019-10-23 DIAGNOSIS — N898 Other specified noninflammatory disorders of vagina: Secondary | ICD-10-CM

## 2019-10-23 DIAGNOSIS — N899 Noninflammatory disorder of vagina, unspecified: Secondary | ICD-10-CM | POA: Diagnosis not present

## 2019-10-23 NOTE — Progress Notes (Signed)
    Belinda Harper 11-Oct-1940 [id number]        79 y.o.  [id number]   RP: Insertion of new Milex ring pessary #5 with support  HPI: Had increased vaginal discharge.  Wanted to change to a smaller pessary, pessary ordered, will insert today.   OB History  Gravida Para Term Preterm AB Living  3 2     1 2   SAB TAB Ectopic Multiple Live Births  1            # Outcome Date GA Lbr Len/2nd Weight Sex Delivery Anes PTL Lv  3 SAB           2 Para           1 Para             Past medical history,surgical history, problem list, medications, allergies, family history and social history were all reviewed and documented in the EPIC chart.   Directed ROS with pertinent positives and negatives documented in the history of present illness/assessment and plan.  Exam:  Vitals:   10/23/19 1521  BP: 130/80   General appearance:  Normal  Abdomen: Normal  Gynecologic exam: Vulva normal.  Removal of pessary.  Vaginal mucosa intact.  Insertion of smaller (#5) Milex ring with support.  Very good fit.   Assessment/Plan:  79 y.o. [id number]   1. Vaginal irritation from pessary Change to a smaller pessary, from Milex ring #6 to Milex ring #5 with support.  Very good fit.    [name] MD, 3:48 PM 10/23/2019

## 2019-10-26 ENCOUNTER — Encounter: Payer: Self-pay | Admitting: Obstetrics & Gynecology

## 2019-10-26 NOTE — Patient Instructions (Signed)
1. Vaginal irritation from pessary Change to a smaller pessary, from Milex ring #6 to Milex ring #5 with support.  Very good fit.    Belinda Harper, it was a pleasure seeing you today!

## 2019-11-05 ENCOUNTER — Other Ambulatory Visit: Payer: Self-pay | Admitting: Internal Medicine

## 2019-11-05 DIAGNOSIS — I10 Essential (primary) hypertension: Secondary | ICD-10-CM

## 2019-12-05 ENCOUNTER — Ambulatory Visit (INDEPENDENT_AMBULATORY_CARE_PROVIDER_SITE_OTHER): Payer: Medicare Other

## 2019-12-05 ENCOUNTER — Other Ambulatory Visit: Payer: Self-pay

## 2019-12-05 DIAGNOSIS — Z0000 Encounter for general adult medical examination without abnormal findings: Secondary | ICD-10-CM

## 2019-12-05 NOTE — Progress Notes (Signed)
Subjective:   Belinda Harper is a 79 y.o. female who presents for Medicare Annual (Subsequent) preventive examination.  I connected with Jennice Renegar today by telephone and verified that I am speaking with the correct person using two identifiers. Location patient: home Location provider: work Persons participating in the virtual visit: patient, provider.   I discussed the limitations, risks, security and privacy concerns of performing an evaluation and management service by telephone and the availability of in person appointments. I also discussed with the patient that there may be a patient responsible charge related to this service. The patient expressed understanding and verbally consented to this telephonic visit.    Interactive audio and video telecommunications were attempted between this provider and patient, however failed, due to patient having technical difficulties OR patient did not have access to video capability.  We continued and completed visit with audio only.      Review of Systems    N/A Cardiac Risk Factors include: advanced age (>11men, >74 women)     Objective:    Today's Vitals   There is no height or weight on file to calculate BMI.  Advanced Directives 12/05/2019 04/23/2018 03/14/2018 10/20/2017 02/21/2015 02/18/2015 02/05/2015  Does Patient Have a Medical Advance Directive? No No No No No No No  Would patient like information on creating a medical advance directive? No - Patient declined Yes (Inpatient - patient requests chaplain consult to create a medical advance directive) No - Patient declined - No - patient declined information - No - patient declined information    Current Medications (verified) Outpatient Encounter Medications as of 12/05/2019  Medication Sig   amLODipine (NORVASC) 5 MG tablet TAKE 1 TABLET BY MOUTH DAILY.   Calcium Citrate-Vitamin D (CITRACAL + D PO) Take 1 tablet by mouth daily with lunch. 500 mg calcium (2 X 250 mg)    FERROUS GLUCONATE PO Take 162 mg by mouth at bedtime. 7 tablets X 27 mg elemental iron = 189 mg elemental iron (each tablet also contains 110 mg calcium)   Magnesium 500 MG TABS Take 500 mg by mouth daily with lunch.    Menatetrenone (VITAMIN K2) 100 MCG TABS Take 100 mcg by mouth daily.   Multiple Vitamin (MULTIVITAMIN WITH MINERALS) TABS tablet Take 1 tablet by mouth daily. One a Day - Women's   vitamin C (ASCORBIC ACID) 500 MG tablet Take 500 mg by mouth at bedtime.    zinc gluconate 50 MG tablet Take 50 mg by mouth daily.   Multiple Vitamins-Minerals (HAIR/SKIN/NAILS/BIOTIN) TABS Take 1 tablet by mouth daily before lunch. (Patient not taking: Reported on 12/05/2019)   [DISCONTINUED] cephALEXin (KEFLEX) 500 MG capsule Take 500 mg by mouth 2 (two) times daily.   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   Anxiety    ASTHMA 01/03/2007   Blood transfusion 12 2010   hg 4 transfused 4 units   DEPRESSION 01/03/2007   resolved   Family history of adverse reaction to anesthesia    pt's mother as she gets older organs shut down    FRACTURE, ANKLE, LEFT 04/10/2009   Gastritis 2002   GERD (gastroesophageal reflux disease)    HEMORRHOIDS 05/19/2008  HYPERCHOLESTEROLEMIA 05/19/2008   Normal cardiac stress test    2014   OSTEOARTHRITIS 01/03/2007   OSTEOPOROSIS 01/03/2007   hx of actonbel use for at least 5 years    Sepsis due to cellulitis ([place]) 02/05/2015   Unspecified vitamin D deficiency 04/06/2007   UTERINE PROLAPSE 04/06/2007   Past Surgical History:  Procedure Laterality Date   ANKLE FRACTURE SURGERY  02/2009    left ankle fracture and dislocation   CATARACT EXTRACTION Left    last year    CATARACT EXTRACTION W/ INTRAOCULAR LENS IMPLANT  2000   right eye   HARDWARE REMOVAL Left 02/21/2015   Procedure: IRRIGATION AND DEBRIDEMENT HARDWARE REMOVAL LEFT ANKLE ;  Surgeon: Gaynelle Arabian, MD;  Location: [facility];  Service: Orthopedics;  Laterality: Left;   MASS EXCISION Left 09/11/2012   Procedure: 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; Rotation Flap;  Surgeon: Wynonia Sours, MD;  Location: [facility];  Service: Orthopedics;  Laterality: Left;   ORIF FINGER / THUMB FRACTURE  1970   reattached   PANENDOSCOPY     small bowel bx   Family History  Problem Relation Age of Onset   Hyperlipidemia Mother    Heart disease Mother        CABG age 63   Ovarian cancer Mother    Uterine cancer Mother    Arthritis Mother    Osteoporosis Mother    Heart disease Father        CABG age 44   Arthritis Father    Melanoma Father    Social History   Socioeconomic History   Marital status: Divorced    Spouse name: Not on file   Number of children: 2   Years of education: Not on file   Highest education level: Not on file  Occupational History   Not on file  Tobacco Use   Smoking status: Never Smoker   Smokeless tobacco: Never Used  Vaping Use   Vaping Use: Never used  Substance and Sexual Activity   Alcohol use: Yes    Comment: rare   Drug use: No   Sexual activity: Not Currently    Comment: 1st intercourse- 18, partners- 1   Other Topics Concern   Not on file  Social History Narrative   HH of  1   To rent out room    Retired Dispensing optician   Never smoked    Pet cat allergic    Divorced   Regular exericise  walking mowing the lawnswimming   Silver sneakers    Social Determinants of Radio broadcast assistant Strain: Low Risk    Difficulty of Paying Living Expenses: Not hard at all  Food Insecurity: No Food Insecurity   Worried  About Charity fundraiser in the Last Year: Never true   Arboriculturist in the Last Year: Never true  Transportation Needs: No Transportation Needs   Lack of Transportation (Medical): No   Lack of Transportation (Non-Medical): No  Physical Activity: Inactive   Days of Exercise per Week: 0 days   Minutes of Exercise per Session: 0 min  Stress: No Stress Concern Present   Feeling of Stress : Not at all  Social Connections: Moderately Integrated   Frequency of Communication with Friends and Family: More than three times a week   Frequency of Social Gatherings with Friends and Family: Three times a week   Attends Religious Services: More than 4 times per year  Active Member of Clubs or Organizations: Yes   Attends Archivist Meetings: 1 to 4 times per year   Marital Status: Divorced    Tobacco Counseling Counseling given: Not Answered   Clinical Intake:  Pre-visit preparation completed: Yes  Pain : No/denies pain     Nutritional Risks: None Diabetes: No  How often do you need to have someone help you when you read instructions, pamphlets, or other written materials from your doctor or pharmacy?: 1 - Never What is the last grade level you completed in school?: 1 year of college  Diabetic?No  Interpreter Needed?: No  Information entered by :: [place] of Daily Living In your present state of health, do you have any difficulty performing the following activities: 12/05/2019  Hearing? Y  Comment Has some difficulty hearing if there is background noise  Vision? N  Difficulty concentrating or making decisions? Y  Comment Has issues with short term memory  Walking or climbing stairs? N  Dressing or bathing? N  Doing errands, shopping? N  Preparing Food and eating ? N  Using the Toilet? N  In the past six months, have you accidently leaked urine? Y  Comment Has some bladder leakage  Do you have problems with loss of bowel control? N    Managing your Medications? N  Managing your Finances? N  Housekeeping or managing your Housekeeping? N  Some recent data might be hidden    Patient Care Team: Panosh, Standley Brooking, MD as PCP - General Servando Salina, MD (Obstetrics and Gynecology) Clent Jacks, MD (Ophthalmology) Mikey Bussing, DDS (Dentistry) Druscilla Brownie, MD as Consulting Physician (Dermatology) [name], MD as Consulting Physician (Hematology and Oncology) Gaynelle Arabian, MD as Consulting Physician (Orthopedic Surgery)  Indicate any recent Medical Services you may have received from other than [facility] providers in the past year (date may be approximate).     Assessment:   This is a routine wellness examination for Debroah.  Hearing/Vision screen  Hearing Screening   125Hz  250Hz  500Hz  1000Hz  2000Hz  3000Hz  4000Hz  6000Hz  8000Hz   Right ear:           Left ear:           Vision Screening Comments: Gets annual eye exams   Dietary issues and exercise activities discussed: Current Exercise Habits: The patient does not participate in regular exercise at present, Exercise limited by: None identified  Goals     Patient Stated     Signed up for a bus trip to [facility] in November! Continue to plan trips away!     Patient Stated     I would like to start exercising more, like walking and water aerobics       Depression Screen PHQ 2/9 Scores 12/05/2019 11/21/2018 10/20/2017 10/18/2016 10/07/2015 10/01/2014 12/18/2013  PHQ - 2 Score 0 0 0 0 0 0 0  PHQ- 9 Score 0 - - - - - -    Fall Risk Fall Risk  12/05/2019 11/21/2018 10/20/2017 10/18/2016 10/07/2015  Falls in the past year? 1 1 Yes Yes Yes  Comment - - fell last week; in her driveway on concrete  - -  Number falls in past yr: 1 1 2  or more 2 or more -  Comment - - - - -  Injury with Fall? 0 0 - No -  Risk for fall due to : History of fall(s);Impaired balance/gait - History of fall(s) - -  Follow up Falls evaluation completed;Falls prevention discussed  Falls evaluation completed Education provided - -    Any stairs in or around the home? Yes  If so, are there any without handrails? No  Home free of loose throw rugs in walkways, pet beds, electrical cords, etc? Yes  Adequate lighting in your home to reduce risk of falls? Yes   ASSISTIVE DEVICES UTILIZED TO PREVENT FALLS:  Life alert? No  Use of a cane, walker or w/c? No  Grab bars in the bathroom? No  Shower chair or bench in shower? No  Elevated toilet seat or a handicapped toilet? Yes    Cognitive Function: MMSE - Mini Mental State Exam 10/20/2017  Orientation to time 5  Orientation to Place 5  Registration 3  Attention/ Calculation 5  Recall 3  Language- name 2 objects 2  Language- repeat 1  Language- follow 3 step command 3  Language- read & follow direction 1  Write a sentence 1  Copy design 1  Total score 30     6CIT Screen 12/05/2019  What Year? 0 points  What month? 0 points  What time? 0 points  Count back from 20 0 points  Months in reverse 0 points  Repeat phrase 2 points  Total Score 2    Immunizations Immunization History  Administered Date(s) Administered   Influenza Whole 03/10/2009, 01/27/2010   Influenza, High Dose Seasonal PF 03/23/2016, 04/24/2017, 02/09/2018, 03/19/2019   Pneumococcal Conjugate-13 12/18/2013   Pneumococcal Polysaccharide-23 04/25/2006, 10/18/2012   Td 04/25/1997, 06/22/2009   Zoster 06/28/2010   Zoster Recombinat (Shingrix) 11/01/2017, 01/04/2018    TDAP status: Due, Education has been provided regarding the importance of this vaccine. Advised may receive this vaccine at local pharmacy or Health Dept. Aware to provide a copy of the vaccination record if obtained from local pharmacy or Health Dept. Verbalized acceptance and understanding. Flu Vaccine status: Up to date Pneumococcal vaccine status: Up to date Covid-19 vaccine status: Declined, Education has been provided regarding the importance of this vaccine but  patient still declined. Advised may receive this vaccine at local pharmacy or Health Dept.or vaccine clinic. Aware to provide a copy of the vaccination record if obtained from local pharmacy or Health Dept. Verbalized acceptance and understanding.  Qualifies for Shingles Vaccine? Yes   Zostavax completed Yes   Shingrix Completed?: Yes  Screening Tests Health Maintenance  Topic Date Due   Hepatitis C Screening  Never done   COVID-19 Vaccine (1) Never done   TETANUS/TDAP  06/23/2019   INFLUENZA VACCINE  11/24/2019   DEXA SCAN  Completed   PNA vac Low Risk Adult  Completed    Health Maintenance  Health Maintenance Due  Topic Date Due   Hepatitis C Screening  Never done   COVID-19 Vaccine (1) Never done   TETANUS/TDAP  06/23/2019   INFLUENZA VACCINE  11/24/2019    Colorectal cancer screening: Completed 08/30/2011. Repeat every 10 years Mammogram status: Completed 10/17/2019. Repeat every year Bone Density status: Completed 10/17/2019. Results reflect: Bone density results: NORMAL. Repeat every 2 years.  Lung Cancer Screening: (Low Dose CT Chest recommended if Age 4-80 years, 30 pack-year currently smoking OR have quit w/in 15years.) does not qualify.   Lung Cancer Screening Referral: N/A  Additional Screening:  Hepatitis C Screening: does not qualify;   Vision Screening: Recommended annual ophthalmology exams for early detection of glaucoma and other disorders of the eye. Is the patient up to date with their annual eye exam?  Yes  Who is the provider or what is  the name of the office in which the patient attends annual eye exams? Dr. Katy Fitch If pt is not established with a provider, would they like to be referred to a provider to establish care? No .   Dental Screening: Recommended annual dental exams for proper oral hygiene  Community Resource Referral / Chronic Care Management: CRR required this visit?  No   CCM required this visit?  No      Plan:     I  have personally reviewed and noted the following in the patients chart:    Medical and social history  Use of alcohol, tobacco or illicit drugs   Current medications and supplements  Functional ability and status  Nutritional status  Physical activity  Advanced directives  List of other physicians  Hospitalizations, surgeries, and ER visits in previous 12 months  Vitals  Screenings to include cognitive, depression, and falls  Referrals and appointments  In addition, I have reviewed and discussed with patient certain preventive protocols, quality metrics, and best practice recommendations. A written personalized care plan for preventive services as well as general preventive health recommendations were provided to patient.     Ofilia Neas, LPN   4/80/1655   Nurse Notes: Patient would like to inquire about what vitamins is recommended that she take daily

## 2019-12-05 NOTE — Patient Instructions (Signed)
[name] , Thank you for taking time to come for your Medicare Wellness Visit. I appreciate your ongoing commitment to your health goals. Please review the following plan we discussed and let me know if I can assist you in the future.   Screening recommendations/referrals: Colonoscopy: Up to date, next due 08/29/2021 Mammogram: Up to date, next due 10/16/2020 Bone Density: up to date, no longer required  Recommended yearly ophthalmology/optometry visit for glaucoma screening and checkup Recommended yearly dental visit for hygiene and checkup  Vaccinations: Influenza vaccine: Up to date, due this fall 2021 Pneumococcal vaccine: completed series Tdap vaccine: currently due, you may contact your insurance to discuss cost or await and injury to receive  Shingles vaccine: Completed series     Advanced directives: Advance directive discussed with you today. Even though you declined this today please call our office should you change your mind and we can give you the proper paperwork for you to fill out.   Conditions/risks identified: None  Next appointment: 12/06/2019 @ 11:00 am with Dr. Regis Bill   Preventive Care 65 Years and Older, Female Preventive care refers to lifestyle choices and visits with your health care provider that can promote health and wellness. What does preventive care include?  A yearly physical exam. This is also called an annual well check.  Dental exams once or twice a year.  Routine eye exams. Ask your health care provider how often you should have your eyes checked.  Personal lifestyle choices, including:  Daily care of your teeth and gums.  Regular physical activity.  Eating a healthy diet.  Avoiding tobacco and drug use.  Limiting alcohol use.  Practicing safe sex.  Taking low-dose aspirin every day.  Taking vitamin and mineral supplements as recommended by your health care provider. What happens during an annual well check? The services and  screenings done by your health care provider during your annual well check will depend on your age, overall health, lifestyle risk factors, and family history of disease. Counseling  Your health care provider may ask you questions about your:  Alcohol use.  Tobacco use.  Drug use.  Emotional well-being.  Home and relationship well-being.  Sexual activity.  Eating habits.  History of falls.  Memory and ability to understand (cognition).  Work and work Statistician.  Reproductive health. Screening  You may have the following tests or measurements:  Height, weight, and BMI.  Blood pressure.  Lipid and cholesterol levels. These may be checked every 5 years, or more frequently if you are over 43 years old.  Skin check.  Lung cancer screening. You may have this screening every year starting at age 53 if you have a 30-pack-year history of smoking and currently smoke or have quit within the past 15 years.  Fecal occult blood test (FOBT) of the stool. You may have this test every year starting at age 24.  Flexible sigmoidoscopy or colonoscopy. You may have a sigmoidoscopy every 5 years or a colonoscopy every 10 years starting at age 66.  Hepatitis C blood test.  Hepatitis B blood test.  Sexually transmitted disease (STD) testing.  Diabetes screening. This is done by checking your blood sugar (glucose) after you have not eaten for a while (fasting). You may have this done every 1-3 years.  Bone density scan. This is done to screen for osteoporosis. You may have this done starting at age 62.  Mammogram. This may be done every 1-2 years. Talk to your health care provider about how  often you should have regular mammograms. Talk with your health care provider about your test results, treatment options, and if necessary, the need for more tests. Vaccines  Your health care provider may recommend certain vaccines, such as:  Influenza vaccine. This is recommended every  year.  Tetanus, diphtheria, and acellular pertussis (Tdap, Td) vaccine. You may need a Td booster every 10 years.  Zoster vaccine. You may need this after age 3.  Pneumococcal 13-valent conjugate (PCV13) vaccine. One dose is recommended after age 64.  Pneumococcal polysaccharide (PPSV23) vaccine. One dose is recommended after age 89. Talk to your health care provider about which screenings and vaccines you need and how often you need them. This information is not intended to replace advice given to you by your health care provider. Make sure you discuss any questions you have with your health care provider. Document Released: 05/08/2015 Document Revised: 12/30/2015 Document Reviewed: 02/10/2015 Elsevier Interactive Patient Education  2017 [place] Prevention in the Home Falls can cause injuries. They can happen to people of all ages. There are many things you can do to make your home safe and to help prevent falls. What can I do on the outside of my home?  Regularly fix the edges of walkways and driveways and fix any cracks.  Remove anything that might make you trip as you walk through a door, such as a raised step or threshold.  Trim any bushes or trees on the path to your home.  Use bright outdoor lighting.  Clear any walking paths of anything that might make someone trip, such as rocks or tools.  Regularly check to see if handrails are loose or broken. Make sure that both sides of any steps have handrails.  Any raised decks and porches should have guardrails on the edges.  Have any leaves, snow, or ice cleared regularly.  Use sand or salt on walking paths during winter.  Clean up any spills in your garage right away. This includes oil or grease spills. What can I do in the bathroom?  Use night lights.  Install grab bars by the toilet and in the tub and shower. Do not use towel bars as grab bars.  Use non-skid mats or decals in the tub or shower.  If you  need to sit down in the shower, use a plastic, non-slip stool.  Keep the floor dry. Clean up any water that spills on the floor as soon as it happens.  Remove soap buildup in the tub or shower regularly.  Attach bath mats securely with double-sided non-slip rug tape.  Do not have throw rugs and other things on the floor that can make you trip. What can I do in the bedroom?  Use night lights.  Make sure that you have a light by your bed that is easy to reach.  Do not use any sheets or blankets that are too big for your bed. They should not hang down onto the floor.  Have a firm chair that has side arms. You can use this for support while you get dressed.  Do not have throw rugs and other things on the floor that can make you trip. What can I do in the kitchen?  Clean up any spills right away.  Avoid walking on wet floors.  Keep items that you use a lot in easy-to-reach places.  If you need to reach something above you, use a strong step stool that has a grab bar.  Keep electrical  cords out of the way.  Do not use floor polish or wax that makes floors slippery. If you must use wax, use non-skid floor wax.  Do not have throw rugs and other things on the floor that can make you trip. What can I do with my stairs?  Do not leave any items on the stairs.  Make sure that there are handrails on both sides of the stairs and use them. Fix handrails that are broken or loose. Make sure that handrails are as long as the stairways.  Check any carpeting to make sure that it is firmly attached to the stairs. Fix any carpet that is loose or worn.  Avoid having throw rugs at the top or bottom of the stairs. If you do have throw rugs, attach them to the floor with carpet tape.  Make sure that you have a light switch at the top of the stairs and the bottom of the stairs. If you do not have them, ask someone to add them for you. What else can I do to help prevent falls?  Wear shoes  that:  Do not have high heels.  Have rubber bottoms.  Are comfortable and fit you well.  Are closed at the toe. Do not wear sandals.  If you use a stepladder:  Make sure that it is fully opened. Do not climb a closed stepladder.  Make sure that both sides of the stepladder are locked into place.  Ask someone to hold it for you, if possible.  Clearly mark and make sure that you can see:  Any grab bars or handrails.  First and last steps.  Where the edge of each step is.  Use tools that help you move around (mobility aids) if they are needed. These include:  Canes.  Walkers.  Scooters.  Crutches.  Turn on the lights when you go into a dark area. Replace any light bulbs as soon as they burn out.  Set up your furniture so you have a clear path. Avoid moving your furniture around.  If any of your floors are uneven, fix them.  If there are any pets around you, be aware of where they are.  Review your medicines with your doctor. Some medicines can make you feel dizzy. This can increase your chance of falling. Ask your doctor what other things that you can do to help prevent falls. This information is not intended to replace advice given to you by your health care provider. Make sure you discuss any questions you have with your health care provider. Document Released: 02/05/2009 Document Revised: 09/17/2015 Document Reviewed: 05/16/2014 Elsevier Interactive Patient Education  2017 [employer].

## 2019-12-06 ENCOUNTER — Other Ambulatory Visit: Payer: Self-pay

## 2019-12-06 ENCOUNTER — Encounter: Payer: Self-pay | Admitting: Internal Medicine

## 2019-12-06 ENCOUNTER — Ambulatory Visit (INDEPENDENT_AMBULATORY_CARE_PROVIDER_SITE_OTHER): Payer: Medicare Other | Admitting: Internal Medicine

## 2019-12-06 VITALS — BP 128/66 | HR 74 | Temp 98.2°F | Ht 67.0 in | Wt 151.2 lb

## 2019-12-06 DIAGNOSIS — Z7189 Other specified counseling: Secondary | ICD-10-CM

## 2019-12-06 DIAGNOSIS — E785 Hyperlipidemia, unspecified: Secondary | ICD-10-CM | POA: Diagnosis not present

## 2019-12-06 DIAGNOSIS — Z79899 Other long term (current) drug therapy: Secondary | ICD-10-CM | POA: Diagnosis not present

## 2019-12-06 DIAGNOSIS — Z0000 Encounter for general adult medical examination without abnormal findings: Secondary | ICD-10-CM | POA: Diagnosis not present

## 2019-12-06 DIAGNOSIS — R05 Cough: Secondary | ICD-10-CM

## 2019-12-06 DIAGNOSIS — D509 Iron deficiency anemia, unspecified: Secondary | ICD-10-CM

## 2019-12-06 DIAGNOSIS — Z2821 Immunization not carried out because of patient refusal: Secondary | ICD-10-CM

## 2019-12-06 DIAGNOSIS — M810 Age-related osteoporosis without current pathological fracture: Secondary | ICD-10-CM

## 2019-12-06 DIAGNOSIS — I10 Essential (primary) hypertension: Secondary | ICD-10-CM

## 2019-12-06 DIAGNOSIS — Z0184 Encounter for antibody response examination: Secondary | ICD-10-CM | POA: Diagnosis not present

## 2019-12-06 DIAGNOSIS — R058 Other specified cough: Secondary | ICD-10-CM

## 2019-12-06 NOTE — Patient Instructions (Addendum)
Will notify you  of labs when available. Chest is clear today   I advise getting  The covid vaccine. Risk is low compared to the bad version of the disease/infection.   Ok to take vitamins      Health Maintenance, Female Adopting a healthy lifestyle and getting preventive care are important in promoting health and wellness. Ask your health care provider about:  The right schedule for you to have regular tests and exams.  Things you can do on your own to prevent diseases and keep yourself healthy. What should I know about diet, weight, and exercise? Eat a healthy diet   Eat a diet that includes plenty of vegetables, fruits, low-fat dairy products, and lean protein.  Do not eat a lot of foods that are high in solid fats, added sugars, or sodium. Maintain a healthy weight Body mass index (BMI) is used to identify weight problems. It estimates body fat based on height and weight. Your health care provider can help determine your BMI and help you achieve or maintain a healthy weight. Get regular exercise Get regular exercise. This is one of the most important things you can do for your health. Most adults should:  Exercise for at least 150 minutes each week. The exercise should increase your heart rate and make you sweat (moderate-intensity exercise).  Do strengthening exercises at least twice a week. This is in addition to the moderate-intensity exercise.  Spend less time sitting. Even light physical activity can be beneficial. Watch cholesterol and blood lipids Have your blood tested for lipids and cholesterol at 78 years of age, then have this test every 5 years. Have your cholesterol levels checked more often if:  Your lipid or cholesterol levels are high.  You are older than 78 years of age.  You are at high risk for heart disease. What should I know about cancer screening? Depending on your health history and family history, you may need to have cancer screening at  various ages. This may include screening for:  Breast cancer.  Cervical cancer.  Colorectal cancer.  Skin cancer.  Lung cancer. What should I know about heart disease, diabetes, and high blood pressure? Blood pressure and heart disease  High blood pressure causes heart disease and increases the risk of stroke. This is more likely to develop in people who have high blood pressure readings, are of African descent, or are overweight.  Have your blood pressure checked: ? Every 3-5 years if you are 58-4 years of age. ? Every year if you are 70 years old or older. Diabetes Have regular diabetes screenings. This checks your fasting blood sugar level. Have the screening done:  Once every three years after age 82 if you are at a normal weight and have a low risk for diabetes.  More often and at a younger age if you are overweight or have a high risk for diabetes. What should I know about preventing infection? Hepatitis B If you have a higher risk for hepatitis B, you should be screened for this virus. Talk with your health care provider to find out if you are at risk for hepatitis B infection. Hepatitis C Testing is recommended for:  Everyone born from [birth year] through 1964.  Anyone with known risk factors for hepatitis C. Sexually transmitted infections (STIs)  Get screened for STIs, including gonorrhea and chlamydia, if: ? You are sexually active and are younger than 78 years of age. ? You are older than 78 years of age and  your health care provider tells you that you are at risk for this type of infection. ? Your sexual activity has changed since you were last screened, and you are at increased risk for chlamydia or gonorrhea. Ask your health care provider if you are at risk.  Ask your health care provider about whether you are at high risk for HIV. Your health care provider may recommend a prescription medicine to help prevent HIV infection. If you choose to take medicine to prevent  HIV, you should first get tested for HIV. You should then be tested every 3 months for as long as you are taking the medicine. Pregnancy  If you are about to stop having your period (premenopausal) and you may become pregnant, seek counseling before you get pregnant.  Take 400 to 800 micrograms (mcg) of folic acid every day if you become pregnant.  Ask for birth control (contraception) if you want to prevent pregnancy. Osteoporosis and menopause Osteoporosis is a disease in which the bones lose minerals and strength with aging. This can result in bone fractures. If you are 27 years old or older, or if you are at risk for osteoporosis and fractures, ask your health care provider if you should:  Be screened for bone loss.  Take a calcium or vitamin D supplement to lower your risk of fractures.  Be given hormone replacement therapy (HRT) to treat symptoms of menopause. Follow these instructions at home: Lifestyle  Do not use any products that contain nicotine or tobacco, such as cigarettes, e-cigarettes, and chewing tobacco. If you need help quitting, ask your health care provider.  Do not use street drugs.  Do not share needles.  Ask your health care provider for help if you need support or information about quitting drugs. Alcohol use  Do not drink alcohol if: ? Your health care provider tells you not to drink. ? You are pregnant, may be pregnant, or are planning to become pregnant.  If you drink alcohol: ? Limit how much you use to 0-1 drink a day. ? Limit intake if you are breastfeeding.  Be aware of how much alcohol is in your drink. In the [place] one drink equals one 12 oz bottle of beer (355 mL), one 5 oz glass of wine (148 mL), or one 1 oz glass of hard liquor (44 mL). General instructions  Schedule regular health, dental, and eye exams.  Stay current with your vaccines.  Tell your health care provider if: ? You often feel depressed. ? You have ever been abused or do  not feel safe at home. Summary  Adopting a healthy lifestyle and getting preventive care are important in promoting health and wellness.  Follow your health care provider's instructions about healthy diet, exercising, and getting tested or screened for diseases.  Follow your health care provider's instructions on monitoring your cholesterol and blood pressure. This information is not intended to replace advice given to you by your health care provider. Make sure you discuss any questions you have with your health care provider. Document Revised: 04/04/2018 Document Reviewed: 04/04/2018 Elsevier Patient Education  [place].     Memory Compensation Strategies  3. Use "WARM" strategy.  W= write it down  A= associate it  R= repeat it  M= make a mental note  2.   You can keep a Social worker.  Use a 3-ring notebook with sections for the following: calendar, important names and phone numbers,  medications, doctors' names/phone numbers, lists/reminders, and a section  to journal what you did  each day.   3.    Use a calendar to write appointments down.  4.    Write yourself a schedule for the day.  This can be placed on the calendar or in a separate section of the Memory Notebook.  Keeping a  regular schedule can help memory.  5.    Use medication organizer with sections for each day or morning/evening pills.  You may need help loading it  6.    Keep a basket, or pegboard by the door.  Place items that you need to take out with you in the basket or on the pegboard.  You may also want to  include a message board for reminders.  7.    Use sticky notes.  Place sticky notes with reminders in a place where the task is performed.  For example: " turn off the  stove" placed by the stove, "lock the door" placed on the door at eye level, " take your medications" on  the bathroom mirror or by the place where you normally take your medications.  8.    Use alarms/timers.  Use while  cooking to remind yourself to check on food or as a reminder to take your medicine, or as a  reminder to make a call, or as a reminder to perform another task, etc.

## 2019-12-06 NOTE — Progress Notes (Signed)
Chief Complaint  Patient presents with  . Follow-up    Labs for iron and cholesterol and wants BP checked  . Annual Exam  . Anemia    HPI: Belinda Harper 79 y.o. comes in today for Preventive Medicare exam/ wellness visit . Needs lab   And monitoring     Onset  Upper resp sx    And still has sx   4 weeks .   Had fever   Sweating   And didn't  Get tested.  Grandson  Had a cold  Age 22 and then  He ex  Had sick  Then patient had throat clearing  .  Had sweats .     And given   Antibiotic. By teled[facility] of sons in [place] has cough   And stuffy again . But no fever and better   Had rattling in chest  Previously  Told she had sinus infection  Today no coughing    Sob now  Has over 50 vits  Can she take these>  Takes mag to counteract the se of irone she takes 7 per day with vit C   No vaccine  covid  Cause not sure of safety and concer n  ? About getting if anemia  And dying after vaccine ( know a person where that happened)  Health Maintenance  Topic Date Due  . Hepatitis C Screening  Never done  . COVID-19 Vaccine (1) Never done  . TETANUS/TDAP  06/23/2019  . INFLUENZA VACCINE  11/24/2019  . DEXA SCAN  Completed  . PNA vac Low Risk Adult  Completed   Health Maintenance Review LIFESTYLE:  Exercise:   Tobacco/ETS: no Alcohol:  no Sugar beverages: no Sleep: ok Drug use: no HH:  Cap  3 above     Hearing: ok  Vision:  No limitations at present . Last eye check UTD  Safety:  Has smoke detector and wears seat belts.  No firearms. No excess sun exposure. Sees dentist regularly.  Falls: n  Memory: nota happy with her memory   And had test ok.   Short term memory .   Depression: No anhedonia unusual crying or depressive symptoms  Nutrition: Eats well balanced diet; adequate calcium and vitamin D. No swallowing chewing problems.  Injury: no major injuries in the last six months.  Other healthcare providers:  Reviewed today .  Preventive parameters:Reviewed    ADLS:   There are no problems or need for assistance  driving, feeding, obtaining food, dressing, toileting and bathing, managing money using phone. She is independent.   ROS:   hemmorrhoids some times    GEN/ HEENT: No fever, significant weight changes sweats headaches vision problems hearing changes, CV/ PULM; No chest pain shortness of breath cough, syncope,edema  change in exercise tolerance. GI /GU: No adominal pain, vomiting, change in bowel habits. No blood in the stool. No significant GU symptoms. SKIN/HEME: ,no acute skin rashes suspicious lesions or bleeding. No lymphadenopathy, nodules, masses.  NEURO/ PSYCH:  No neurologic signs such as weakness numbness. No depression anxiety. IMM/ Allergy: No unusual infections.  Allergy .   REST of 12 system review negative except as per HPI   Past Medical History:  Diagnosis Date  . 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 ALLERGIC RHINITIS 01/03/2007  . 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 Anxiety   . ASTHMA 01/03/2007  . Blood transfusion 12 2010   hg 4 transfused  4 units  . DEPRESSION 01/03/2007   resolved  . Family history of adverse reaction to anesthesia    pt's mother as she gets older organs shut down   . FRACTURE, ANKLE, LEFT 04/10/2009  . Gastritis 2002  . GERD (gastroesophageal reflux disease)   . HEMORRHOIDS 05/19/2008  . HYPERCHOLESTEROLEMIA 05/19/2008  . Normal cardiac stress test    2014  . OSTEOARTHRITIS 01/03/2007  . OSTEOPOROSIS 01/03/2007   hx of actonbel use for at least 5 years   . Sepsis due to cellulitis ([place]) 02/05/2015  . Unspecified vitamin D deficiency 04/06/2007  . UTERINE PROLAPSE 04/06/2007    Family History  Problem Relation Age of Onset  . Hyperlipidemia Mother   . Heart disease Mother        CABG age 67  . Ovarian cancer Mother   . Uterine cancer Mother   . Arthritis Mother   . Osteoporosis Mother   . Heart disease Father        CABG age 68  . Arthritis Father   .  Melanoma Father     Social History   Socioeconomic History  . Marital status: Divorced    Spouse name: Not on file  . Number of children: 2  . Years of education: Not on file  . Highest education level: Not on file  Occupational History  . Not on file  Tobacco Use  . Smoking status: Never Smoker  . Smokeless tobacco: Never Used  Vaping Use  . Vaping Use: Never used  Substance and Sexual Activity  . Alcohol use: Yes    Comment: rare  . Drug use: No  . Sexual activity: Not Currently    Comment: 1st intercourse- 18, partners- 1   Other Topics Concern  . Not on file  Social History Narrative   HH of  1   To rent out room    Retired Dispensing optician   Never smoked    Pet cat allergic    Divorced   Regular exericise  walking mowing the lawnswimming   Silver sneakers    Social Determinants of Health   Financial Resource Strain: Low Risk   . Difficulty of Paying Living Expenses: Not hard at all  Food Insecurity: No Food Insecurity  . Worried About Charity fundraiser in the Last Year: Never true  . Ran Out of Food in the Last Year: Never true  Transportation Needs: No Transportation Needs  . Lack of Transportation (Medical): No  . Lack of Transportation (Non-Medical): No  Physical Activity: Inactive  . Days of Exercise per Week: 0 days  . Minutes of Exercise per Session: 0 min  Stress: No Stress Concern Present  . Feeling of Stress : Not at all  Social Connections: Moderately Integrated  . Frequency of Communication with Friends and Family: More than three times a week  . Frequency of Social Gatherings with Friends and Family: Three times a week  . Attends Religious Services: More than 4 times per year  . Active Member of Clubs or Organizations: Yes  . Attends Archivist Meetings: 1 to 4 times per year  . Marital Status: Divorced    Outpatient Encounter Medications as of 12/06/2019  Medication Sig  . amLODipine (NORVASC) 5 MG tablet TAKE 1 TABLET BY MOUTH  DAILY.  . Calcium Citrate-Vitamin D (CITRACAL + D PO) Take 1 tablet by mouth daily with lunch. 500 mg calcium (2 X 250 mg)  . FERROUS GLUCONATE PO Take 162 mg  by mouth at bedtime. 7 tablets X 27 mg elemental iron = 189 mg elemental iron (each tablet also contains 110 mg calcium)  . Magnesium 500 MG TABS Take 500 mg by mouth daily with lunch.   . Menatetrenone (VITAMIN K2) 100 MCG TABS Take 100 mcg by mouth daily.  . Multiple Vitamin (MULTIVITAMIN WITH MINERALS) TABS tablet Take 1 tablet by mouth daily. One a Day - Women's  . vitamin C (ASCORBIC ACID) 500 MG tablet Take 500 mg by mouth at bedtime.   [name] zinc gluconate 50 MG tablet Take 50 mg by mouth daily.  . Multiple Vitamins-Minerals (HAIR/SKIN/NAILS/BIOTIN) TABS Take 1 tablet by mouth daily before lunch. (Patient not taking: Reported on 12/05/2019)   Facility-Administered Encounter Medications as of 12/06/2019  Medication  . cloNIDine (CATAPRES) tablet 0.1 mg    EXAM:  BP 128/66   Pulse 74   Temp 98.2 F (36.8 C) (Oral)   Ht 5\' 7"  (1.702 m)   Wt 151 lb 3.2 oz (68.6 kg)   SpO2 97%   BMI 23.68 kg/m   Body mass index is 23.68 kg/m.  Physical Exam: Vital signs reviewed JME:QAST is a well-developed well-nourished alert cooperative   who appears stated age in no acute distress.  No cough mild hoarseness  HEENT: normocephalic atraumatic , Eyes: PERRL EOM's full, conjunctiva clear, Nares: paten,t no deformity discharge or tenderness., Ears: no deformity EAC's clear TMs with normal landmarks. Mouth: clear OP, masked . NECK: supple without masses, thyromegaly or bruits. CHEST/PULM:  Clear to auscultation and percussion breath sounds equal no wheeze , rales or rhonchi. No chest wall deformities or tenderness. CV: PMI is nondisplaced, S1 S2 no gallops, murmurs, rubs. Peripheral pulses are full without delay.No JVD.  ABDOMEN: Bowel sounds normal nontender  No guard or rebound, no hepato splenomegal no CVA tenderness.   Extremtities:  No  clubbing cyanosis or edema, no acute joint swelling or redness no focal atrophy NEURO:  Oriented x3, cranial nerves 3-12 appear to be intact, no obvious focal weakness,gait within normal limits no abnormal reflexes or asymmetrical SKIN: No acute rashes normal turgor, color, no bruising or petechiae. PSYCH: Oriented, good eye contact, no obvious depression anxiety, cognition and judgment appear normal. LN: no cervical axillary inguinal adenopathy No noted deficits in memory, attention, and speech.   Lab Results  Component Value Date   WBC 4.5 05/22/2019   HGB 11.3 (L) 05/22/2019   HCT 35.7 (L) 05/22/2019   PLT 209.0 05/22/2019   GLUCOSE 82 11/21/2018   CHOL 227 (H) 11/21/2018   TRIG 83.0 11/21/2018   HDL 71.90 11/21/2018   LDLDIRECT 141.9 08/06/2012   LDLCALC 138 (H) 11/21/2018   ALT 14 11/21/2018   AST 24 11/21/2018   NA 140 11/21/2018   K 4.7 11/21/2018   CL 103 11/21/2018   CREATININE 0.82 11/21/2018   BUN 21 11/21/2018   CO2 31 11/21/2018   TSH 1.23 11/21/2018   INR 1.04 04/10/2009   HGBA1C 5.6 11/21/2018    ASSESSMENT AND PLAN:  Discussed the following assessment and plan:  Visit for preventive health examination  Iron deficiency anemia, unspecified iron deficiency anemia type - Plan: Basic metabolic panel, CBC with Differential/Platelet, Hepatic function panel, Lipid panel, TSH, TSH, Lipid panel, Hepatic function panel, CBC with Differential/Platelet, Basic metabolic panel  Hyperlipidemia, unspecified hyperlipidemia type - Plan: Basic metabolic panel, CBC with Differential/Platelet, Hepatic function panel, Lipid panel, TSH, TSH, Lipid panel, Hepatic function panel, CBC with Differential/Platelet, Basic metabolic panel  Hypertension, unspecified  type - Plan: Basic metabolic panel, CBC with Differential/Platelet, Hepatic function panel, Lipid panel, TSH, TSH, Lipid panel, Hepatic function panel, CBC with Differential/Platelet, Basic metabolic panel  Medication  management - Plan: Basic metabolic panel, CBC with Differential/Platelet, Hepatic function panel, Lipid panel, TSH, TSH, Lipid panel, Hepatic function panel, CBC with Differential/Platelet, Basic metabolic panel  Age-related osteoporosis without current pathological fracture - Plan: Basic metabolic panel, CBC with Differential/Platelet, Hepatic function panel, Lipid panel, TSH, TSH, Lipid panel, Hepatic function panel, CBC with Differential/Platelet, Basic metabolic panel  Respiratory tract congestion with cough - Plan: SARS CoV2 Serology(COVID19) AB(IgG,IgM),Immunoassay, SARS CoV2 Serology(COVID19) AB(IgG,IgM),Immunoassay, CANCELED: SARS CoV2 Serology(COVID19) AB(IgG,IgM),Immunoassay  Immunity status testing - Plan: SARS CoV2 Serology(COVID19) AB(IgG,IgM),Immunoassay, SARS CoV2 Serology(COVID19) AB(IgG,IgM),Immunoassay, CANCELED: SARS CoV2 Serology(COVID19) AB(IgG,IgM),Immunoassay  COVID-19 virus vaccination declined - will consdier   counseled  encouraged benefir more than risk   Counseled about COVID-19 virus infection Counseling at length about covid risk benefit and  Not a risk with her anemia ( may have been  distortion of  Low platelet hx side effect )   And understanding of assoc in time vs cause are different    She thinks may be she had the infection  And will think about getting vaccine after discussion Patient Care Team: Jamiria Langill, Standley Brooking, MD as PCP - General Servando Salina, MD (Obstetrics and Gynecology) Clent Jacks, MD (Ophthalmology) Mikey Bussing, DDS (Dentistry) Druscilla Brownie, MD as Consulting Physician (Dermatology) [name], MD as Consulting Physician (Hematology and Oncology) Gaynelle Arabian, MD as Consulting Physician (Orthopedic Surgery)  Patient Instructions  Will notify you  of labs when available. Chest is clear today   I advise getting  The covid vaccine. Risk is low compared to the bad version of the disease/infection.   Ok to take  vitamins      Health Maintenance, Female Adopting a healthy lifestyle and getting preventive care are important in promoting health and wellness. Ask your health care provider about:  The right schedule for you to have regular tests and exams.  Things you can do on your own to prevent diseases and keep yourself healthy. What should I know about diet, weight, and exercise? Eat a healthy diet   Eat a diet that includes plenty of vegetables, fruits, low-fat dairy products, and lean protein.  Do not eat a lot of foods that are high in solid fats, added sugars, or sodium. Maintain a healthy weight Body mass index (BMI) is used to identify weight problems. It estimates body fat based on height and weight. Your health care provider can help determine your BMI and help you achieve or maintain a healthy weight. Get regular exercise Get regular exercise. This is one of the most important things you can do for your health. Most adults should:  Exercise for at least 150 minutes each week. The exercise should increase your heart rate and make you sweat (moderate-intensity exercise).  Do strengthening exercises at least twice a week. This is in addition to the moderate-intensity exercise.  Spend less time sitting. Even light physical activity can be beneficial. Watch cholesterol and blood lipids Have your blood tested for lipids and cholesterol at 78 years of age, then have this test every 5 years. Have your cholesterol levels checked more often if:  Your lipid or cholesterol levels are high.  You are older than 78 years of age.  You are at high risk for heart disease. What should I know about cancer screening? Depending on your health history and family history,  you may need to have cancer screening at various ages. This may include screening for:  Breast cancer.  Cervical cancer.  Colorectal cancer.  Skin cancer.  Lung cancer. What should I know about heart disease, diabetes,  and high blood pressure? Blood pressure and heart disease  High blood pressure causes heart disease and increases the risk of stroke. This is more likely to develop in people who have high blood pressure readings, are of African descent, or are overweight.  Have your blood pressure checked: ? Every 3-5 years if you are 75-65 years of age. ? Every year if you are 26 years old or older. Diabetes Have regular diabetes screenings. This checks your fasting blood sugar level. Have the screening done:  Once every three years after age 45 if you are at a normal weight and have a low risk for diabetes.  More often and at a younger age if you are overweight or have a high risk for diabetes. What should I know about preventing infection? Hepatitis B If you have a higher risk for hepatitis B, you should be screened for this virus. Talk with your health care provider to find out if you are at risk for hepatitis B infection. Hepatitis C Testing is recommended for:  Everyone born from [birth year] through 1964.  Anyone with known risk factors for hepatitis C. Sexually transmitted infections (STIs)  Get screened for STIs, including gonorrhea and chlamydia, if: ? You are sexually active and are younger than 78 years of age. ? You are older than 78 years of age and your health care provider tells you that you are at risk for this type of infection. ? Your sexual activity has changed since you were last screened, and you are at increased risk for chlamydia or gonorrhea. Ask your health care provider if you are at risk.  Ask your health care provider about whether you are at high risk for HIV. Your health care provider may recommend a prescription medicine to help prevent HIV infection. If you choose to take medicine to prevent HIV, you should first get tested for HIV. You should then be tested every 3 months for as long as you are taking the medicine. Pregnancy  If you are about to stop having your period  (premenopausal) and you may become pregnant, seek counseling before you get pregnant.  Take 400 to 800 micrograms (mcg) of folic acid every day if you become pregnant.  Ask for birth control (contraception) if you want to prevent pregnancy. Osteoporosis and menopause Osteoporosis is a disease in which the bones lose minerals and strength with aging. This can result in bone fractures. If you are 66 years old or older, or if you are at risk for osteoporosis and fractures, ask your health care provider if you should:  Be screened for bone loss.  Take a calcium or vitamin D supplement to lower your risk of fractures.  Be given hormone replacement therapy (HRT) to treat symptoms of menopause. Follow these instructions at home: Lifestyle  Do not use any products that contain nicotine or tobacco, such as cigarettes, e-cigarettes, and chewing tobacco. If you need help quitting, ask your health care provider.  Do not use street drugs.  Do not share needles.  Ask your health care provider for help if you need support or information about quitting drugs. Alcohol use  Do not drink alcohol if: ? Your health care provider tells you not to drink. ? You are pregnant, may be pregnant, or are  planning to become pregnant.  If you drink alcohol: ? Limit how much you use to 0-1 drink a day. ? Limit intake if you are breastfeeding.  Be aware of how much alcohol is in your drink. In the [place] one drink equals one 12 oz bottle of beer (355 mL), one 5 oz glass of wine (148 mL), or one 1 oz glass of hard liquor (44 mL). General instructions  Schedule regular health, dental, and eye exams.  Stay current with your vaccines.  Tell your health care provider if: ? You often feel depressed. ? You have ever been abused or do not feel safe at home. Summary  Adopting a healthy lifestyle and getting preventive care are important in promoting health and wellness.  Follow your health care provider's  instructions about healthy diet, exercising, and getting tested or screened for diseases.  Follow your health care provider's instructions on monitoring your cholesterol and blood pressure. This information is not intended to replace advice given to you by your health care provider. Make sure you discuss any questions you have with your health care provider. Document Revised: 04/04/2018 Document Reviewed: 04/04/2018 Elsevier Patient Education  [employer].     Memory Compensation Strategies  3. Use "WARM" strategy.  W= write it down  A= associate it  R= repeat it  M= make a mental note  2.   You can keep a Social worker.  Use a 3-ring notebook with sections for the following: calendar, important names and phone numbers,  medications, doctors' names/phone numbers, lists/reminders, and a section to journal what you did  each day.   3.    Use a calendar to write appointments down.  4.    Write yourself a schedule for the day.  This can be placed on the calendar or in a separate section of the Memory Notebook.  Keeping a  regular schedule can help memory.  5.    Use medication organizer with sections for each day or morning/evening pills.  You may need help loading it  6.    Keep a basket, or pegboard by the door.  Place items that you need to take out with you in the basket or on the pegboard.  You may also want to  include a message board for reminders.  7.    Use sticky notes.  Place sticky notes with reminders in a place where the task is performed.  For example: " turn off the  stove" placed by the stove, "lock the door" placed on the door at eye level, " take your medications" on  the bathroom mirror or by the place where you normally take your medications.  8.    Use alarms/timers.  Use while cooking to remind yourself to check on food or as a reminder to take your medicine, or as a  reminder to make a call, or as a reminder to perform another task,  etc.      Standley Brooking. Atalia Litzinger M.D.

## 2019-12-06 NOTE — Progress Notes (Signed)
No chief complaint on file.   HPI: Belinda Harper 79 y.o. comes in today for Preventive Medicare exam/ wellness visit .Since last visit.  Health Maintenance  Topic Date Due  . Hepatitis C Screening  Never done  . COVID-19 Vaccine (1) Never done  . TETANUS/TDAP  06/23/2019  . INFLUENZA VACCINE  11/24/2019  . DEXA SCAN  Completed  . PNA vac Low Risk Adult  Completed   Health Maintenance Review LIFESTYLE:  Exercise:   Tobacco/ETS: Alcohol:  Sugar beverages: Sleep: Drug use: no HH:      Hearing:   Vision:  No limitations at present . Last eye check UTD  Safety:  Has smoke detector and wears seat belts.  No firearms. No excess sun exposure. Sees dentist regularly.  Falls:   Advance directive :  Reviewed  Has one.  Memory: Felt to be good  , no concern from her or her family.  Depression: No anhedonia unusual crying or depressive symptoms  Nutrition: Eats well balanced diet; adequate calcium and vitamin D. No swallowing chewing problems.  Injury: no major injuries in the last six months.  Other healthcare providers:  Reviewed today .  Preventive parameters: up-to-date  Reviewed   ADLS:   There are no problems or need for assistance  driving, feeding, obtaining food, dressing, toileting and bathing, managing money using phone. She is independent.   ROS:  GEN/ HEENT: No fever, significant weight changes sweats headaches vision problems hearing changes, CV/ PULM; No chest pain shortness of breath cough, syncope,edema  change in exercise tolerance. GI /GU: No adominal pain, vomiting, change in bowel habits. No blood in the stool. No significant GU symptoms. SKIN/HEME: ,no acute skin rashes suspicious lesions or bleeding. No lymphadenopathy, nodules, masses.  NEURO/ PSYCH:  No neurologic signs such as weakness numbness. No depression anxiety. IMM/ Allergy: No unusual infections.  Allergy .   REST of 12 system review negative except as per HPI   Past Medical  History:  Diagnosis Date  . 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 ALLERGIC RHINITIS 01/03/2007  . 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 Anxiety   . ASTHMA 01/03/2007  . Blood transfusion 12 2010   hg 4 transfused 4 units  . DEPRESSION 01/03/2007   resolved  . Family history of adverse reaction to anesthesia    pt's mother as she gets older organs shut down   . FRACTURE, ANKLE, LEFT 04/10/2009  . Gastritis 2002  . GERD (gastroesophageal reflux disease)   . HEMORRHOIDS 05/19/2008  . HYPERCHOLESTEROLEMIA 05/19/2008  . Normal cardiac stress test    2014  . OSTEOARTHRITIS 01/03/2007  . OSTEOPOROSIS 01/03/2007   hx of actonbel use for at least 5 years   . Sepsis due to cellulitis ([place]) 02/05/2015  . Unspecified vitamin D deficiency 04/06/2007  . UTERINE PROLAPSE 04/06/2007    Family History  Problem Relation Age of Onset  . Hyperlipidemia Mother   . Heart disease Mother        CABG age 75  . Ovarian cancer Mother   . Uterine cancer Mother   . Arthritis Mother   . Osteoporosis Mother   . Heart disease Father        CABG age 40  . Arthritis Father   . Melanoma Father     Social History   Socioeconomic History  . Marital status: Divorced    Spouse name: Not on file  . Number of children: 2  . Years of  education: Not on file  . Highest education level: Not on file  Occupational History  . Not on file  Tobacco Use  . Smoking status: Never Smoker  . Smokeless tobacco: Never Used  Vaping Use  . Vaping Use: Never used  Substance and Sexual Activity  . Alcohol use: Yes    Comment: rare  . Drug use: No  . Sexual activity: Not Currently    Comment: 1st intercourse- 18, partners- 1   Other Topics Concern  . Not on file  Social History Narrative   HH of  1   To rent out room    Retired Dispensing optician   Never smoked    Pet cat allergic    Divorced   Regular exericise  walking mowing the lawnswimming   Silver sneakers    Social Determinants of  Health   Financial Resource Strain: Low Risk   . Difficulty of Paying Living Expenses: Not hard at all  Food Insecurity: No Food Insecurity  . Worried About Charity fundraiser in the Last Year: Never true  . Ran Out of Food in the Last Year: Never true  Transportation Needs: No Transportation Needs  . Lack of Transportation (Medical): No  . Lack of Transportation (Non-Medical): No  Physical Activity: Inactive  . Days of Exercise per Week: 0 days  . Minutes of Exercise per Session: 0 min  Stress: No Stress Concern Present  . Feeling of Stress : Not at all  Social Connections: Moderately Integrated  . Frequency of Communication with Friends and Family: More than three times a week  . Frequency of Social Gatherings with Friends and Family: Three times a week  . Attends Religious Services: More than 4 times per year  . Active Member of Clubs or Organizations: Yes  . Attends Archivist Meetings: 1 to 4 times per year  . Marital Status: Divorced    Outpatient Encounter Medications as of 12/06/2019  Medication Sig  . amLODipine (NORVASC) 5 MG tablet TAKE 1 TABLET BY MOUTH DAILY.  . Calcium Citrate-Vitamin D (CITRACAL + D PO) Take 1 tablet by mouth daily with lunch. 500 mg calcium (2 X 250 mg)  . FERROUS GLUCONATE PO Take 162 mg by mouth at bedtime. 7 tablets X 27 mg elemental iron = 189 mg elemental iron (each tablet also contains 110 mg calcium)  . Magnesium 500 MG TABS Take 500 mg by mouth daily with lunch.   . Menatetrenone (VITAMIN K2) 100 MCG TABS Take 100 mcg by mouth daily.  . Multiple Vitamin (MULTIVITAMIN WITH MINERALS) TABS tablet Take 1 tablet by mouth daily. One a Day - Women's  . Multiple Vitamins-Minerals (HAIR/SKIN/NAILS/BIOTIN) TABS Take 1 tablet by mouth daily before lunch. (Patient not taking: Reported on 12/05/2019)  . vitamin C (ASCORBIC ACID) 500 MG tablet Take 500 mg by mouth at bedtime.   [name] zinc gluconate 50 MG tablet Take 50 mg by mouth daily.    Facility-Administered Encounter Medications as of 12/06/2019  Medication  . cloNIDine (CATAPRES) tablet 0.1 mg    EXAM:  There were no vitals taken for this visit.  There is no height or weight on file to calculate BMI.  Physical Exam: Vital signs reviewed [id number] is a well-developed well-nourished alert cooperative   who appears stated age in no acute distress.  HEENT: normocephalic atraumatic , Eyes: PERRL EOM's full, conjunctiva clear, Nares: paten,t no deformity discharge or tenderness., Ears: no deformity EAC's clear TMs with normal landmarks. Mouth: clear  OP, no lesions, edema.  Moist mucous membranes. Dentition in adequate repair. NECK: supple without masses, thyromegaly or bruits. CHEST/PULM:  Clear to auscultation and percussion breath sounds equal no wheeze , rales or rhonchi. No chest wall deformities or tenderness. CV: PMI is nondisplaced, S1 S2 no gallops, murmurs, rubs. Peripheral pulses are full without delay.No JVD .  ABDOMEN: Bowel sounds normal nontender  No guard or rebound, no hepato splenomegal no CVA tenderness.   Extremtities:  No clubbing cyanosis or edema, no acute joint swelling or redness no focal atrophy NEURO:  Oriented x3, cranial nerves 3-12 appear to be intact, no obvious focal weakness,gait within normal limits no abnormal reflexes or asymmetrical SKIN: No acute rashes normal turgor, color, no bruising or petechiae. PSYCH: Oriented, good eye contact, no obvious depression anxiety, cognition and judgment appear normal. LN: no cervical axillary inguinal adenopathy No noted deficits in memory, attention, and speech.   Lab Results  Component Value Date   WBC 4.5 05/22/2019   HGB 11.3 (L) 05/22/2019   HCT 35.7 (L) 05/22/2019   PLT 209.0 05/22/2019   GLUCOSE 82 11/21/2018   CHOL 227 (H) 11/21/2018   TRIG 83.0 11/21/2018   HDL 71.90 11/21/2018   LDLDIRECT 141.9 08/06/2012   LDLCALC 138 (H) 11/21/2018   ALT 14 11/21/2018   AST 24 11/21/2018   NA  140 11/21/2018   K 4.7 11/21/2018   CL 103 11/21/2018   CREATININE 0.82 11/21/2018   BUN 21 11/21/2018   CO2 31 11/21/2018   TSH 1.23 11/21/2018   INR 1.04 04/10/2009   HGBA1C 5.6 11/21/2018    ASSESSMENT AND PLAN:  Discussed the following assessment and plan:  No diagnosis found.  Patient Care Team: Cormick Moss, Standley Brooking, MD as PCP - General Servando Salina, MD (Obstetrics and Gynecology) Clent Jacks, MD (Ophthalmology) Mikey Bussing, DDS (Dentistry) Druscilla Brownie, MD as Consulting Physician (Dermatology) [name], MD as Consulting Physician (Hematology and Oncology) Gaynelle Arabian, MD as Consulting Physician (Orthopedic Surgery)  There are no Patient Instructions on file for this visit.  Standley Brooking. Lenton Gendreau M.D.

## 2019-12-09 ENCOUNTER — Other Ambulatory Visit: Payer: Self-pay

## 2019-12-09 DIAGNOSIS — D649 Anemia, unspecified: Secondary | ICD-10-CM

## 2019-12-09 DIAGNOSIS — D509 Iron deficiency anemia, unspecified: Secondary | ICD-10-CM

## 2019-12-09 LAB — HEPATIC FUNCTION PANEL
AG Ratio: 1.3 (calc) (ref 1.0–2.5)
ALT: 13 U/L (ref 6–29)
AST: 21 U/L (ref 10–35)
Albumin: 4 g/dL (ref 3.6–5.1)
Alkaline phosphatase (APISO): 74 U/L (ref 37–153)
Bilirubin, Direct: 0.1 mg/dL (ref 0.0–0.2)
Globulin: 3.1 g/dL (calc) (ref 1.9–3.7)
Indirect Bilirubin: 0.3 mg/dL (calc) (ref 0.2–1.2)
Total Bilirubin: 0.4 mg/dL (ref 0.2–1.2)
Total Protein: 7.1 g/dL (ref 6.1–8.1)

## 2019-12-09 LAB — CBC WITH DIFFERENTIAL/PLATELET
Absolute Monocytes: 312 cells/uL (ref 200–950)
Basophils Absolute: 49 cells/uL (ref 0–200)
Basophils Relative: 1.3 %
Eosinophils Absolute: 152 cells/uL (ref 15–500)
Eosinophils Relative: 4 %
HCT: 26.7 % — ABNORMAL LOW (ref 35.0–45.0)
Hemoglobin: 8.2 g/dL — ABNORMAL LOW (ref 11.7–15.5)
Lymphs Abs: 1151 cells/uL (ref 850–3900)
MCH: 26.9 pg — ABNORMAL LOW (ref 27.0–33.0)
MCHC: 30.7 g/dL — ABNORMAL LOW (ref 32.0–36.0)
MCV: 87.5 fL (ref 80.0–100.0)
MPV: 10 fL (ref 7.5–12.5)
Monocytes Relative: 8.2 %
Neutro Abs: 2136 cells/uL (ref 1500–7800)
Neutrophils Relative %: 56.2 %
Platelets: 354 10*3/uL (ref 140–400)
RBC: 3.05 10*6/uL — ABNORMAL LOW (ref 3.80–5.10)
RDW: 15.2 % — ABNORMAL HIGH (ref 11.0–15.0)
Total Lymphocyte: 30.3 %
WBC: 3.8 10*3/uL (ref 3.8–10.8)

## 2019-12-09 LAB — BASIC METABOLIC PANEL
BUN: 17 mg/dL (ref 7–25)
CO2: 31 mmol/L (ref 20–32)
Calcium: 9.9 mg/dL (ref 8.6–10.4)
Chloride: 102 mmol/L (ref 98–110)
Creat: 0.88 mg/dL (ref 0.60–0.93)
Glucose, Bld: 86 mg/dL (ref 65–99)
Potassium: 4.4 mmol/L (ref 3.5–5.3)
Sodium: 139 mmol/L (ref 135–146)

## 2019-12-09 LAB — SARS COV-2 SEROLOGY(COVID-19)AB(IGG,IGM),IMMUNOASSAY
SARS CoV-2 AB IgG: NEGATIVE
SARS CoV-2 IgM: NEGATIVE

## 2019-12-09 LAB — LIPID PANEL
Cholesterol: 215 mg/dL — ABNORMAL HIGH (ref <200)
HDL: 55 mg/dL (ref >=50)
LDL Cholesterol (Calc): 133 mg/dL (calc) — ABNORMAL HIGH
Non-HDL Cholesterol (Calc): 160 mg/dL (calc) — ABNORMAL HIGH (ref <130)
Total CHOL/HDL Ratio: 3.9 (calc) (ref <5.0)
Triglycerides: 138 mg/dL (ref <150)

## 2019-12-09 LAB — TSH: TSH: 1 mIU/L (ref 0.40–4.50)

## 2019-12-09 NOTE — Progress Notes (Signed)
Your anemia is much worse right now hg down to 8.2 rest of labs ok   Antibodies negative for past covid infection  Advise  getting the covid vaccine as we discussed  Rest of labs ok  Since y ou are taking  lots of iron and Hemoglobin has dropped  please refer to hematology to consider an iron  infusion   Megan please refer  to hematology s asap  since her last visit with them was in 2016 they may need new referral

## 2019-12-10 ENCOUNTER — Telehealth: Payer: Self-pay | Admitting: Hematology and Oncology

## 2019-12-10 NOTE — Telephone Encounter (Signed)
Received a new hem referral from Dr. Regis Bill for Belinda Harper. Ms. Mikrut has been cld and scheduled to see Dr. Alvy Bimler on 8/24 at 2:20pm.

## 2019-12-11 ENCOUNTER — Encounter: Payer: Self-pay | Admitting: Internal Medicine

## 2019-12-16 ENCOUNTER — Encounter: Payer: Self-pay | Admitting: Hematology and Oncology

## 2019-12-16 ENCOUNTER — Other Ambulatory Visit: Payer: Self-pay

## 2019-12-16 ENCOUNTER — Inpatient Hospital Stay: Payer: Medicare Other | Attending: Hematology and Oncology | Admitting: Hematology and Oncology

## 2019-12-16 VITALS — BP 142/55 | HR 71 | Temp 98.3°F | Resp 18 | Ht 67.0 in | Wt 154.4 lb

## 2019-12-16 DIAGNOSIS — D72819 Decreased white blood cell count, unspecified: Secondary | ICD-10-CM

## 2019-12-16 DIAGNOSIS — D500 Iron deficiency anemia secondary to blood loss (chronic): Secondary | ICD-10-CM | POA: Diagnosis not present

## 2019-12-16 DIAGNOSIS — K909 Intestinal malabsorption, unspecified: Secondary | ICD-10-CM

## 2019-12-16 DIAGNOSIS — D539 Nutritional anemia, unspecified: Secondary | ICD-10-CM

## 2019-12-16 NOTE — Progress Notes (Signed)
[facility] NOTE  Patient Care Team: Panosh, Standley Brooking, MD as PCP - General Servando Salina, MD (Obstetrics and Gynecology) Clent Jacks, MD (Ophthalmology) Mikey Bussing, DDS (Dentistry) Druscilla Brownie, MD as Consulting Physician (Dermatology) [name], MD as Consulting Physician (Hematology and Oncology) Gaynelle Arabian, MD as Consulting Physician (Orthopedic Surgery)  CHIEF COMPLAINTS/PURPOSE OF CONSULTATION:  Recurrent pancytopenia, chronic leukopenia with iron deficiency  HISTORY OF PRESENTING ILLNESS:  Belinda Harper 79 y.o. female is here because of recurrent pancytopenia I have not seen her since 2015 She was transferred to my care after her prior physician has left I reviewed the patient's records extensive and collaborated the history with the patient. Summary of her history is as follows: This is a pleasant woman with multifactorial anemia and associated leukopenia. 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 The chronic anemia and leukopenia go back as far as we have records (2007) when white counts ran as low as 2700. She has a normal differential. White count average is 3000 and has not changed. Platelet count is normal. Best hemoglobin she achieves with parenteral iron replacement is 11 g. She had a nondiagnostic bone marrow biopsy done in 2006. She had received intravenous iron infusion in the past. Her last colonoscopy in May 2013 showed severe diverticulosis with internal hemorrhoids. She feels well. Denies recent infection. She denies symptoms of fatigue. She tolerated iron supplement well. She had periodic hemorrhoidal bleeding. She was found to have abnormal CBC from intermittently over the years by her primary care doctor She has started taking oral iron supplements since November of last year Most recently, she have noted passage of dark stool She attempted to increase  her oral iron supplement but still complain of excessive fatigue Recently, her repeat CBC has dropped from 11-8.2 and hence she was referred back here for further evaluation  She denies recent chest pain on exertion, shortness of breath on minimal exertion, pre-syncopal episodes, or palpitations.  The patient denies over the counter NSAID ingestion. She is not on antiplatelets agents. Her last colonoscopy was in 2013 She had no prior history or diagnosis of cancer. Her age appropriate screening programs are up-to-date. She denies any pica and eats a variety of diet.  The patient was prescribed oral iron supplements and she takes 1 dose of oral iron supplement at night to avoid constipation She has not referred back to see the GI service  MEDICAL HISTORY:  Past Medical History:  Diagnosis Date  . 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 ALLERGIC RHINITIS 01/03/2007  . 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 Anxiety   . ASTHMA 01/03/2007  . Blood transfusion 12 2010   hg 4 transfused 4 units  . DEPRESSION 01/03/2007   resolved  . Family history of adverse reaction to anesthesia    pt's mother as she gets older organs shut down   . FRACTURE, ANKLE, LEFT 04/10/2009  . Gastritis 2002  . GERD (gastroesophageal reflux disease)   . HEMORRHOIDS 05/19/2008  . HYPERCHOLESTEROLEMIA 05/19/2008  . Normal cardiac stress test    2014  . OSTEOARTHRITIS 01/03/2007  . OSTEOPOROSIS 01/03/2007   hx of actonbel use for at least 5 years   . Sepsis due to cellulitis ([place]) 02/05/2015  . Unspecified vitamin D deficiency 04/06/2007  . UTERINE PROLAPSE 04/06/2007    SURGICAL HISTORY: Past Surgical History:  Procedure Laterality Date  . ANKLE FRACTURE SURGERY  02/2009  left ankle fracture and dislocation  . CATARACT EXTRACTION Left    last year   . CATARACT EXTRACTION W/ INTRAOCULAR LENS IMPLANT  2000   right eye  . 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 ORIF FINGER / THUMB FRACTURE  1970   reattached  . PANENDOSCOPY     small bowel bx    SOCIAL HISTORY: Social History   Socioeconomic History  . Marital status: Divorced    Spouse name: Not on file  . Number of children: 2  . Years of education: Not on file  . Highest education level: Not on file  Occupational History  . Not on file  Tobacco Use  . Smoking status: Never Smoker  . Smokeless tobacco: Never Used  Vaping Use  . Vaping Use: Never used  Substance and Sexual Activity  . Alcohol use: Yes    Comment: rare  . Drug use: No  . Sexual activity: Not Currently    Comment: 1st intercourse- 18, partners- 1   Other Topics Concern  . Not on file  Social History Narrative   HH of  1   To rent out room    Retired Dispensing optician   Never smoked    Pet cat allergic    Divorced   Regular exericise  walking mowing the lawnswimming   Silver sneakers    Social Determinants of Health   Financial Resource Strain: Low Risk   . Difficulty of Paying Living Expenses: Not hard at all  Food Insecurity: No Food Insecurity  . Worried About Charity fundraiser in the Last Year: Never true  . Ran Out of Food in the Last Year: Never true  Transportation Needs: No Transportation Needs  . Lack of Transportation (Medical): No  . Lack of Transportation (Non-Medical): No  Physical Activity: Inactive  . Days of Exercise per Week: 0 days  . Minutes of Exercise per Session: 0 min  Stress: No Stress Concern Present  . Feeling of Stress : Not at all  Social Connections: Moderately Integrated  . Frequency of Communication with Friends and  Family: More than three times a week  . Frequency of Social Gatherings with Friends and Family: Three times a week  . Attends Religious Services: More than 4 times per year  . Active Member of Clubs or Organizations: Yes  . Attends Archivist Meetings: 1 to 4 times per year  . Marital Status: Divorced  Human resources officer Violence: Not At Risk  . Fear of Current or Ex-Partner: No  . Emotionally Abused: No  . Physically Abused: No  . Sexually Abused: No    FAMILY HISTORY: Family History  Problem Relation Age of Onset  . Hyperlipidemia Mother   . Heart disease Mother        CABG age 38  . Ovarian cancer Mother   . Uterine cancer Mother   . Arthritis Mother   . Osteoporosis Mother   . Heart disease Father        CABG age 27  . Arthritis Father   . Melanoma Father     ALLERGIES:  is allergic to sulfamethoxazole,  nsaids, yellow jacket venom [bee venom], and sulfa antibiotics.  MEDICATIONS:  Current Outpatient Medications  Medication Sig Dispense Refill  . amLODipine (NORVASC) 5 MG tablet TAKE 1 TABLET BY MOUTH DAILY. 90 tablet 0  . Calcium Citrate-Vitamin D (CITRACAL + D PO) Take 1 tablet by mouth daily with lunch. 500 mg calcium (2 X 250 mg)    . FERROUS GLUCONATE PO Take 162 mg by mouth at bedtime. 7 tablets X 27 mg elemental iron = 189 mg elemental iron (each tablet also contains 110 mg calcium)    . Magnesium 500 MG TABS Take 500 mg by mouth daily with lunch.     . Menatetrenone (VITAMIN K2) 100 MCG TABS Take 100 mcg by mouth daily.    . Multiple Vitamin (MULTIVITAMIN WITH MINERALS) TABS tablet Take 1 tablet by mouth daily. One a Day - Women's    . Multiple Vitamins-Minerals (HAIR/SKIN/NAILS/BIOTIN) TABS Take 1 tablet by mouth daily before lunch. (Patient not taking: Reported on 12/05/2019)    . vitamin C (ASCORBIC ACID) 500 MG tablet Take 500 mg by mouth at bedtime.     [name] zinc gluconate 50 MG tablet Take 50 mg by mouth daily.     Current Facility-Administered  Medications  Medication Dose Route Frequency Provider Last Rate Last Admin  . cloNIDine (CATAPRES) tablet 0.1 mg  0.1 mg Oral Once Nafziger, Tommi Rumps, NP        REVIEW OF SYSTEMS:   Constitutional: Denies fevers, chills or abnormal night sweats Eyes: Denies blurriness of vision, double vision or watery eyes Ears, nose, mouth, throat, and face: Denies mucositis or sore throat Respiratory: Denies cough, dyspnea or wheezes Cardiovascular: Denies palpitation, chest discomfort or lower extremity swelling Gastrointestinal:  Denies nausea, heartburn or change in bowel habits Skin: Denies abnormal skin rashes Lymphatics: Denies new lymphadenopathy or easy bruising Neurological:Denies numbness, tingling or new weaknesses Behavioral/Psych: Mood is stable, no new changes  All other systems were reviewed with the patient and are negative.  PHYSICAL EXAMINATION: ECOG PERFORMANCE STATUS: 1 - Symptomatic but completely ambulatory  Vitals:   12/16/19 1406  BP: (!) 142/55  Pulse: 71  Resp: 18  Temp: 98.3 F (36.8 C)  SpO2: 98%   Filed Weights   12/16/19 1406  Weight: 154 lb 6.4 oz (70 kg)    GENERAL:alert, no distress and comfortable. She looks a bit pale SKIN: skin color, texture, turgor are normal, no rashes or significant lesions EYES: normal, conjunctiva are pink and non-injected, sclera clear OROPHARYNX:no exudate, no erythema and lips, buccal mucosa, and tongue normal  NECK: supple, thyroid normal size, non-tender, without nodularity LYMPH:  no palpable lymphadenopathy in the cervical, axillary or inguinal LUNGS: clear to auscultation and percussion with normal breathing effort HEART: regular rate & rhythm with soft systolic murmurs and no lower extremity edema ABDOMEN:abdomen soft, non-tender and normal bowel sounds Musculoskeletal:no cyanosis of digits and no clubbing  PSYCH: alert & oriented x 3 with fluent speech NEURO: no focal motor/sensory deficits  ASSESSMENT & PLAN:  Iron  deficiency anemia due to chronic blood loss The most likely cause of her anemia is due to chronic blood loss/malabsorption syndrome. We discussed some of the risks, benefits, and alternatives of intravenous iron infusions. The patient is symptomatic from anemia and the iron level is critically low. She tolerated oral iron supplement poorly and desires to achieved higher levels of iron faster for adequate hematopoesis. Some of the side-effects to be expected including risks of infusion reactions, phlebitis, headaches, nausea and fatigue.  The patient is willing to proceed. Patient education material was dispensed.  Goal is to keep ferritin level greater than 50 and resolution of anemia  I recommend GI consult for further evaluation Based on her recent melena, I suspect recent GI bleed I will order intravenous iron treatment x2 and then see her back within the month for further follow-up She does not feel dizzy She does not need blood transfusion for now   Leukopenia She has chronic leukopenia that has been worked up before I will check vitamin B12 in her next visit She is not symptomatic Observe only for now  Orders Placed This Encounter  Procedures  . CBC with Differential/Platelet    Standing Status:   Standing    Number of Occurrences:   22    Standing Expiration Date:   12/15/2020  . Ferritin    Standing Status:   Standing    Number of Occurrences:   2    Standing Expiration Date:   12/15/2020  . Iron and TIBC    Standing Status:   Standing    Number of Occurrences:   2    Standing Expiration Date:   12/15/2020  . Vitamin B12    Standing Status:   Standing    Number of Occurrences:   1    Standing Expiration Date:   12/15/2020  . Ambulatory referral to Gastroenterology    Referral Priority:   Routine    Referral Type:   Consultation    Referral Reason:   Specialty Services Required    Number of Visits Requested:   1     All questions were answered. The patient knows to call  the clinic with any problems, questions or concerns.  The total time spent in the appointment was 55 minutes encounter with patients including review of chart and various tests results, discussions about plan of care and coordination of care plan  [name], MD 8/23/20213:22 PM       [name], MD 12/16/19 3:22 PM

## 2019-12-16 NOTE — Assessment & Plan Note (Signed)
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 We discussed some of the risks, benefits, and alternatives of intravenous iron infusions. The patient is symptomatic from anemia and the iron level is critically low. She tolerated oral iron supplement poorly and desires to achieved higher levels of iron faster for adequate hematopoesis. Some of the side-effects to be expected including risks of infusion reactions, phlebitis, headaches, nausea and fatigue.  The patient is willing to proceed. Patient education material was dispensed.  Goal is to keep ferritin level greater than 50 and resolution of anemia  I recommend GI consult for further evaluation Based on her recent melena, I suspect recent GI bleed I will order intravenous iron treatment x2 and then see her back within the month for further follow-up She does not feel dizzy She does not need blood transfusion for now

## 2019-12-16 NOTE — Assessment & Plan Note (Signed)
She has chronic leukopenia that has been worked up before I will check vitamin B12 in her next visit She is not symptomatic Observe only for now

## 2019-12-20 ENCOUNTER — Encounter: Payer: Self-pay | Admitting: Physician Assistant

## 2019-12-26 ENCOUNTER — Other Ambulatory Visit: Payer: Self-pay

## 2019-12-26 ENCOUNTER — Inpatient Hospital Stay: Payer: Medicare Other | Attending: Hematology and Oncology

## 2019-12-26 VITALS — BP 115/53 | HR 67 | Temp 98.5°F | Resp 17

## 2019-12-26 DIAGNOSIS — Z79899 Other long term (current) drug therapy: Secondary | ICD-10-CM | POA: Diagnosis not present

## 2019-12-26 DIAGNOSIS — D500 Iron deficiency anemia secondary to blood loss (chronic): Secondary | ICD-10-CM

## 2019-12-26 MED ORDER — SODIUM CHLORIDE 0.9 % IV SOLN
Freq: Once | INTRAVENOUS | Status: AC
  Filled 2019-12-26: qty 250

## 2019-12-26 MED ORDER — SODIUM CHLORIDE 0.9 % IV SOLN
510.0000 mg | Freq: Once | INTRAVENOUS | Status: AC
  Administered 2019-12-26: 510 mg via INTRAVENOUS
  Filled 2019-12-26: qty 510

## 2019-12-26 NOTE — Patient Instructions (Signed)

## 2020-01-09 ENCOUNTER — Inpatient Hospital Stay: Payer: Medicare Other

## 2020-01-09 ENCOUNTER — Other Ambulatory Visit: Payer: Self-pay

## 2020-01-09 VITALS — BP 122/39 | HR 58 | Temp 98.2°F | Resp 18

## 2020-01-09 DIAGNOSIS — D500 Iron deficiency anemia secondary to blood loss (chronic): Secondary | ICD-10-CM | POA: Diagnosis not present

## 2020-01-09 MED ORDER — SODIUM CHLORIDE 0.9 % IV SOLN
510.0000 mg | Freq: Once | INTRAVENOUS | Status: AC
  Administered 2020-01-09: 510 mg via INTRAVENOUS
  Filled 2020-01-09: qty 510

## 2020-01-09 MED ORDER — SODIUM CHLORIDE 0.9 % IV SOLN
Freq: Once | INTRAVENOUS | Status: AC
  Filled 2020-01-09: qty 250

## 2020-01-09 NOTE — Patient Instructions (Signed)

## 2020-01-19 ENCOUNTER — Ambulatory Visit
Admission: EM | Admit: 2020-01-19 | Discharge: 2020-01-19 | Disposition: A | Discharge status: Home / Self Care | Payer: Medicare Other | Attending: Emergency Medicine | Admitting: Emergency Medicine

## 2020-01-19 ENCOUNTER — Other Ambulatory Visit: Payer: Self-pay

## 2020-01-19 DIAGNOSIS — Z20822 Contact with and (suspected) exposure to covid-19: Secondary | ICD-10-CM | POA: Diagnosis not present

## 2020-01-19 NOTE — Discharge Instructions (Signed)

## 2020-01-19 NOTE — ED Triage Notes (Signed)
Pt reports exposure to family member that has tested positive for Covid.  Denies sxs.

## 2020-01-21 LAB — SARS-COV-2, NAA 2 DAY TAT

## 2020-01-21 LAB — NOVEL CORONAVIRUS, NAA: SARS-CoV-2, NAA: NOT DETECTED

## 2020-01-27 ENCOUNTER — Encounter: Payer: Self-pay | Admitting: Physician Assistant

## 2020-01-27 ENCOUNTER — Ambulatory Visit (INDEPENDENT_AMBULATORY_CARE_PROVIDER_SITE_OTHER): Payer: Medicare Other | Admitting: Physician Assistant

## 2020-01-27 ENCOUNTER — Other Ambulatory Visit (INDEPENDENT_AMBULATORY_CARE_PROVIDER_SITE_OTHER): Payer: Medicare Other

## 2020-01-27 VITALS — BP 122/62 | HR 59 | Ht 69.0 in | Wt 154.0 lb

## 2020-01-27 DIAGNOSIS — D509 Iron deficiency anemia, unspecified: Secondary | ICD-10-CM | POA: Diagnosis not present

## 2020-01-27 DIAGNOSIS — R1310 Dysphagia, unspecified: Secondary | ICD-10-CM

## 2020-01-27 LAB — CBC WITH DIFFERENTIAL/PLATELET
Basophils Absolute: 0 10*3/uL (ref 0.0–0.1)
Basophils Relative: 0.9 % (ref 0.0–3.0)
Eosinophils Absolute: 0.1 10*3/uL (ref 0.0–0.7)
Eosinophils Relative: 3.1 % (ref 0.0–5.0)
HCT: 37.7 % (ref 36.0–46.0)
Hemoglobin: 12.2 g/dL (ref 12.0–15.0)
Lymphocytes Relative: 28.4 % (ref 12.0–46.0)
Lymphs Abs: 1.3 10*3/uL (ref 0.7–4.0)
MCHC: 32.3 g/dL (ref 30.0–36.0)
MCV: 89.9 fl (ref 78.0–100.0)
Monocytes Absolute: 0.4 10*3/uL (ref 0.1–1.0)
Monocytes Relative: 8.3 % (ref 3.0–12.0)
Neutro Abs: 2.7 10*3/uL (ref 1.4–7.7)
Neutrophils Relative %: 59.3 % (ref 43.0–77.0)
Platelets: 159 10*3/uL (ref 150.0–400.0)
RBC: 4.2 Mil/uL (ref 3.87–5.11)
RDW: 17.9 % — ABNORMAL HIGH (ref 11.5–15.5)
WBC: 4.5 10*3/uL (ref 4.0–10.5)

## 2020-01-27 MED ORDER — PLENVU 140 G PO SOLR: 1.0000 | ORAL | 0 refills | Status: DC

## 2020-01-27 NOTE — Patient Instructions (Signed)
If you are age 78 or older, your body mass index should be between 23-30. Your Body mass index is 22.74 kg/m. If this is out of the aforementioned range listed, please consider follow up with your Primary Care Provider.  If you are age 19 or younger, your body mass index should be between 19-25. Your Body mass index is 22.74 kg/m. If this is out of the aformentioned range listed, please consider follow up with your Primary Care Provider.   Your provider has requested that you go to the basement level for lab work before leaving today. Press "B" on the elevator. The lab is located at the first door on the left as you exit the elevator.  You have been scheduled for a colonoscopy. Please follow written instructions given to you at your visit today.  Please pick up your prep supplies at the pharmacy within the next 1-3 days. If you use inhalers (even only as needed), please bring them with you on the day of your procedure.  HOLD your Iron supplement starting 5 days prior to your Colonoscopy. You may restart after your procedure.  Follow up pending the results of your Colonoscopy.

## 2020-01-28 ENCOUNTER — Encounter: Payer: Self-pay | Admitting: Physician Assistant

## 2020-01-28 NOTE — Progress Notes (Signed)
Subjective:    Patient ID: Belinda Harper, female    DOB: 1940-09-01, 79 y.o.   MRN: [id number]  HPI Belinda Harper is a pleasant 79 year old female, new to GI today referred by Dr. Alvy Bimler for evaluation of iron deficiency anemia.  Patient is known remotely to Dr. Deatra Ina and had been seen here in 2013 for colonoscopy which revealed severe diverticulosis and internal hemorrhoids.  She had undergone EGD in 2010 which was normal with the exception of tiny antral erosions. Capsule endoscopy in 2004 - with exception of pinpoint duodenal erosions.  This was done for iron deficiency anemia.. Patient relates long history of iron deficiency anemia and says that she has been doing well for several years on oral iron therapy and felt that ferrous gluconate had worked well for her for several years.  Now over the past several months she has had a drop in hemoglobin from 11.3 in January 2021 to -8.2.  August 2021.  She did not have repeat iron studies done.  She has undergone iron infusion x2 over the past month. She denies any significant GI symptoms.  She says she still feels fatigued..  She says that her stool has been dark green on occasion.  She is not had any changes in her bowel habits and has no complaints of abdominal pain. She has very occasional heartburn and relates very infrequent episodes of solid food dysphagia.  She says she had a severe episode about 6 months ago requiring regurgitation and randomly has had less severe episodes over the past for 5 months. She is currently taking ferrous gluconate 162 mg nightly.  No aspirin or NSAIDs.  Review of Systems Pertinent positive and negative review of systems were noted in the above HPI section.  All other review of systems was otherwise negative..  Outpatient Encounter Medications as of 01/27/2020  Medication Sig  . amLODipine (NORVASC) 5 MG tablet TAKE 1 TABLET BY MOUTH DAILY.  . Calcium Citrate-Vitamin D (CITRACAL + D PO) Take 1 tablet by mouth daily with  lunch. 500 mg calcium (2 X 250 mg)  . FERROUS GLUCONATE PO Take 162 mg by mouth at bedtime. 7 tablets X 27 mg elemental iron = 189 mg elemental iron (each tablet also contains 110 mg calcium)  . Magnesium 500 MG TABS Take 500 mg by mouth daily with lunch.   . Menatetrenone (VITAMIN K2) 100 MCG TABS Take 100 mcg by mouth daily.  . Multiple Vitamin (MULTIVITAMIN WITH MINERALS) TABS tablet Take 1 tablet by mouth daily. One a Day - Women's  . vitamin C (ASCORBIC ACID) 500 MG tablet Take 500 mg by mouth at bedtime.   [name] zinc gluconate 50 MG tablet Take 50 mg by mouth daily.  [name] PEG-KCl-NaCl-NaSulf-Na Asc-C (PLENVU) 140 g SOLR Take 1 kit by mouth as directed.  . [DISCONTINUED] Multiple Vitamins-Minerals (HAIR/SKIN/NAILS/BIOTIN) TABS Take 1 tablet by mouth daily before lunch.  (Patient not taking: Reported on 01/27/2020)   Facility-Administered Encounter Medications as of 01/27/2020  Medication  . cloNIDine (CATAPRES) tablet 0.1 mg   Allergies  Allergen Reactions  . Sulfamethoxazole Rash  . Nsaids Other (See Comments)    Drops hemoglobin levels  . Yellow Jacket Venom [Bee Venom] Other (See Comments)    Blood pressure dropped, passed out  . Sulfa Antibiotics Rash   Patient Active Problem List   Diagnosis Date Noted  . Deficiency anemia 12/16/2019  . Abnormal LFTs new 10/01/2014  . Other hemorrhoids 12/19/2013  . Pelvic prolapse 12/19/2013  .  Medicare annual wellness visit, initial 12/18/2013  . Vertigo, peripheral 11/21/2012  . Elevated blood pressure reading 11/21/2012  . Dyspnea 10/11/2012  . Chest pain 09/19/2012  . Decreased exercise tolerance 08/21/2012  . Nodule of finger 08/21/2012  . Hx of extrinsic asthma 08/21/2012  . Leukopenia 07/24/2012  . Anemia, normocytic normochromic 07/24/2012  . 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  . Unspecified deficiency anemia 10/04/2011  . Ankle pain 08/29/2011  . Abnormal gait 08/29/2011  . Numbness of foot 08/29/2011  . Leg length  discrepancy 08/07/2011  . Scoliosis 08/07/2011  . Hx of fall 08/07/2011  . Medicare annual wellness visit, subsequent 08/07/2011  . Hematuria 02/23/2011  . Rectal prolapse 07/03/2010  . Visit for preventive health examination 06/28/2010  . MALAISE AND FATIGUE 01/27/2010  . NUMBNESS 01/27/2010  . FRACTURE, ANKLE, LEFT 04/10/2009  . HYPERCHOLESTEROLEMIA 05/19/2008  . 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 HEMORRHOIDS 05/19/2008  . UNSPECIFIED VITAMIN D DEFICIENCY 04/06/2007  . Uterine prolapse 04/06/2007  . Iron deficiency anemia due to chronic blood loss 01/03/2007  . DEPRESSION 01/03/2007  . ALLERGIC RHINITIS 01/03/2007  . ASTHMA 01/03/2007  . OSTEOARTHRITIS 01/03/2007  . Osteoporosis 01/03/2007   Social History   Socioeconomic History  . Marital status: Divorced    Spouse name: Not on file  . Number of children: 2  . Years of education: Not on file  . Highest education level: Not on file  Occupational History  . Occupation: retired  Tobacco Use  . Smoking status: Never Smoker  . Smokeless tobacco: Never Used  Vaping Use  . Vaping Use: Never used  Substance and Sexual Activity  . Alcohol use: Yes    Comment: rare  . Drug use: No  . Sexual activity: Not Currently    Comment: 1st intercourse- 18, partners- 1   Other Topics Concern  . Not on file  Social History Narrative   HH of  1   To rent out room    Retired Dispensing optician   Never smoked    Pet cat allergic    Divorced   Regular exericise  walking mowing the lawnswimming   Silver sneakers    Social Determinants of Health   Financial Resource Strain: Low Risk   . Difficulty of Paying Living Expenses: Not hard at all  Food Insecurity: No Food Insecurity  . Worried About Charity fundraiser in the Last Year: Never true  . Ran Out of Food in the Last Year: Never true  Transportation Needs: No Transportation Needs  . Lack of Transportation (Medical): No  . Lack of Transportation  (Non-Medical): No  Physical Activity: Inactive  . Days of Exercise per Week: 0 days  . Minutes of Exercise per Session: 0 min  Stress: No Stress Concern Present  . Feeling of Stress : Not at all  Social Connections: Moderately Integrated  . Frequency of Communication with Friends and Family: More than three times a week  . Frequency of Social Gatherings with Friends and Family: Three times a week  . Attends Religious Services: More than 4 times per year  . Active Member of Clubs or Organizations: Yes  . Attends Archivist Meetings: 1 to 4 times per year  . Marital Status: Divorced  Human resources officer Violence: Not At Risk  . Fear of Current or Ex-Partner: No  . Emotionally Abused: No  . Physically Abused: No  . Sexually Abused: No    Ms. Guillette's family history includes  Arthritis in her father and mother; Heart disease in her father and mother; Hyperlipidemia in her mother; Melanoma in her father; Osteoporosis in her mother; Ovarian cancer in her mother; Uterine cancer in her mother.      Objective:    Vitals:   01/27/20 1412  BP: 122/62  Pulse: (!) 59  SpO2: 98%    Physical Exam Well-developed well-nourished WF  in no acute distress.  Height, Weight 154, BMI 22.7  HEENT; nontraumatic normocephalic, EOMI, PE RR LA, sclera anicteric. Oropharynx; not examined Neck; supple, no JVD Cardiovascular; regular rate and rhythm with S1-S2, no murmur rub or gallop Pulmonary; Clear bilaterally Abdomen; soft, nontender, nondistended, no palpable mass or hepatosplenomegaly, bowel sounds are active Rectal; not done today Skin; benign exam, no jaundice rash or appreciable lesions Extremities; no clubbing cyanosis or edema skin warm and dry Neuro/Psych; alert and oriented x4, grossly nonfocal mood and affect appropriate       Assessment & Plan:   #89 79 year old white female with history of recurrent iron deficiency anemia, and drop in hemoglobin of 3 g over the past 8  months. Rule out intermittent chronic GI blood loss-rule out occult neoplasm, AVMs, chronic gastropathy or duodenal apathy. Rule out component of malabsorption  Negative colonoscopy 2013 with exception of internal hemorrhoids and severe diverticulosis. EGD 2010 showed tiny antral erosions Capsule endoscopy 2004 punctate duodenal erosions  #2 osteoarthritis  #3 hypertension  Plan; Patient will be established with Dr. Silverio Decamp , and will be scheduled for colonoscopy and EGD with Dr. Silverio Decamp.  Both procedures were discussed in detail with patient including indications risks and benefits and she is agreeable to proceed. We discussed possible repeat capsule endoscopy if EGD and colonoscopy unrevealing. Repeat CBC today.  She says she is scheduled for repeat iron studies etc. at the end of the month with Dr. Alvy Bimler. Continue ferrous gluconate 162 mg p.o. nightly  Delquan Poucher S Pervis Macintyre PA-C 01/28/2020   Cc: [name], MD

## 2020-01-30 ENCOUNTER — Other Ambulatory Visit: Payer: Self-pay | Admitting: Internal Medicine

## 2020-01-30 DIAGNOSIS — I10 Essential (primary) hypertension: Secondary | ICD-10-CM

## 2020-02-03 NOTE — Progress Notes (Signed)
Reviewed and agree with documentation and assessment and plan. K. Veena Daneesha Quinteros , MD   

## 2020-02-13 ENCOUNTER — Encounter: Payer: Self-pay | Admitting: Hematology and Oncology

## 2020-02-13 ENCOUNTER — Telehealth: Payer: Self-pay

## 2020-02-13 ENCOUNTER — Other Ambulatory Visit: Payer: Self-pay | Admitting: Hematology and Oncology

## 2020-02-13 ENCOUNTER — Inpatient Hospital Stay: Payer: Medicare Other

## 2020-02-13 ENCOUNTER — Inpatient Hospital Stay: Payer: Medicare Other | Attending: Hematology and Oncology

## 2020-02-13 ENCOUNTER — Inpatient Hospital Stay (HOSPITAL_BASED_OUTPATIENT_CLINIC_OR_DEPARTMENT_OTHER): Payer: Medicare Other | Admitting: Hematology and Oncology

## 2020-02-13 ENCOUNTER — Other Ambulatory Visit: Payer: Self-pay

## 2020-02-13 VITALS — BP 132/56 | HR 67 | Temp 98.7°F | Resp 18

## 2020-02-13 DIAGNOSIS — Z79899 Other long term (current) drug therapy: Secondary | ICD-10-CM | POA: Diagnosis not present

## 2020-02-13 DIAGNOSIS — D500 Iron deficiency anemia secondary to blood loss (chronic): Secondary | ICD-10-CM | POA: Diagnosis present

## 2020-02-13 DIAGNOSIS — D72819 Decreased white blood cell count, unspecified: Secondary | ICD-10-CM | POA: Diagnosis not present

## 2020-02-13 DIAGNOSIS — D539 Nutritional anemia, unspecified: Secondary | ICD-10-CM

## 2020-02-13 LAB — CBC WITH DIFFERENTIAL/PLATELET
Abs Immature Granulocytes: 0.01 10*3/uL (ref 0.00–0.07)
Basophils Absolute: 0 10*3/uL (ref 0.0–0.1)
Basophils Relative: 1 %
Eosinophils Absolute: 0.2 10*3/uL (ref 0.0–0.5)
Eosinophils Relative: 6 %
HCT: 36.9 % (ref 36.0–46.0)
Hemoglobin: 11.4 g/dL — ABNORMAL LOW (ref 12.0–15.0)
Immature Granulocytes: 0 %
Lymphocytes Relative: 23 %
Lymphs Abs: 0.9 10*3/uL (ref 0.7–4.0)
MCH: 28.2 pg (ref 26.0–34.0)
MCHC: 30.9 g/dL (ref 30.0–36.0)
MCV: 91.3 fL (ref 80.0–100.0)
Monocytes Absolute: 0.4 10*3/uL (ref 0.1–1.0)
Monocytes Relative: 10 %
Neutro Abs: 2.3 10*3/uL (ref 1.7–7.7)
Neutrophils Relative %: 60 %
Platelets: 161 10*3/uL (ref 150–400)
RBC: 4.04 MIL/uL (ref 3.87–5.11)
RDW: 16.3 % — ABNORMAL HIGH (ref 11.5–15.5)
WBC: 3.8 10*3/uL — ABNORMAL LOW (ref 4.0–10.5)
nRBC: 0 % (ref 0.0–0.2)

## 2020-02-13 LAB — VITAMIN B12: Vitamin B-12: 631 pg/mL (ref 180–914)

## 2020-02-13 LAB — IRON AND TIBC
Iron: 38 ug/dL — ABNORMAL LOW (ref 41–142)
Saturation Ratios: 15 % — ABNORMAL LOW (ref 21–57)
TIBC: 250 ug/dL (ref 236–444)
UIBC: 212 ug/dL (ref 120–384)

## 2020-02-13 LAB — FERRITIN: Ferritin: 321 ng/mL — ABNORMAL HIGH (ref 11–307)

## 2020-02-13 MED ORDER — SODIUM CHLORIDE 0.9 % IV SOLN
INTRAVENOUS | Status: DC
  Filled 2020-02-13 (×2): qty 250

## 2020-02-13 MED ORDER — SODIUM CHLORIDE 0.9 % IV SOLN
510.0000 mg | Freq: Once | INTRAVENOUS | Status: AC
  Administered 2020-02-13: 510 mg via INTRAVENOUS
  Filled 2020-02-13: qty 510

## 2020-02-13 NOTE — Telephone Encounter (Signed)
-----   Message from [name], MD sent at 02/13/2020 11:16 AM EDT ----- Regarding: iron test Pls let her know iron test looks good No change in plan as discussed

## 2020-02-13 NOTE — Telephone Encounter (Signed)
Pt notified of below, voiced understanding.  No further needs at this time.

## 2020-02-13 NOTE — Assessment & Plan Note (Addendum)
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 

## 2020-02-13 NOTE — Assessment & Plan Note (Addendum)
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 We discussed some of the risks, benefits, and alternatives of intravenous iron infusions. The patient is symptomatic from anemia and the iron level is critically low. She tolerated oral iron supplement poorly and desires to achieved higher levels of iron faster for adequate hematopoesis. Some of the side-effects to be expected including risks of infusion reactions, phlebitis, headaches, nausea and fatigue.  The patient is willing to proceed. Patient education material was dispensed.  Goal is to keep ferritin level greater than 50 and resolution of anemia  She has appointment pending for colonoscopy in December I recommend she keeps her appointment as scheduled We discussed future follow-up She has appointment to see her primary care doctor in February She usually gets her blood test done with her primary care doctor and this would be cheaper than getting her labs done here If she needs iron again, she knows to call me for return appointment

## 2020-02-13 NOTE — Patient Instructions (Signed)

## 2020-02-13 NOTE — Progress Notes (Signed)
[facility] OFFICE PROGRESS NOTE  Panosh, Standley Brooking, MD  ASSESSMENT & PLAN:  Iron deficiency anemia due to chronic blood loss The most likely cause of her anemia is due to chronic blood loss/malabsorption syndrome. We discussed some of the risks, benefits, and alternatives of intravenous iron infusions. The patient is symptomatic from anemia and the iron level is critically low. She tolerated oral iron supplement poorly and desires to achieved higher levels of iron faster for adequate hematopoesis. Some of the side-effects to be expected including risks of infusion reactions, phlebitis, headaches, nausea and fatigue.  The patient is willing to proceed. Patient education material was dispensed.  Goal is to keep ferritin level greater than 50 and resolution of anemia  She has appointment pending for colonoscopy in December I recommend she keeps her appointment as scheduled We discussed future follow-up She has appointment to see her primary care doctor in February She usually gets her blood test done with her primary care doctor and this would be cheaper than getting her labs done here If she needs iron again, she knows to call me for return appointment  Leukopenia She has chronic leukopenia that has been worked up before She is not symptomatic Observe only for now I have ordered repeat serum vitamin B12 level and the result came back within normal limits   No orders of the defined types were placed in this encounter.   The total time spent in the appointment was 20 minutes encounter with patients including review of chart and various tests results, discussions about plan of care and coordination of care plan   All questions were answered. The patient knows to call the clinic with any problems, questions or concerns. No barriers to learning was detected.    [name], MD 10/21/202111:16 AM  INTERVAL HISTORY: Belinda Harper 79 y.o. female returns for further follow-up on  chronic intermittent iron deficiency anemia and chronic leukopenia Since last time I saw her, she have received 2 doses of intravenous iron She felt better with more energy The patient denies any recent signs or symptoms of bleeding such as spontaneous epistaxis, hematuria or hematochezia. 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 Summary of her history is as follows: This is a pleasant woman with multifactorial anemia and associated leukopenia. 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 The chronic anemia and leukopenia go back as far as we have records (2007) when white counts ran as low as 2700. She has a normal differential. White count average is 3000 and has not changed. Platelet count is normal. Best hemoglobin she achieves with parenteral iron replacement is 11 g. She had a nondiagnostic bone marrow biopsy done in 2006. She had received intravenous iron infusion in the past. Her last colonoscopy in May 2013 showed severe diverticulosis with internal hemorrhoids. She feels well. Denies recent infection. She denies symptoms of fatigue. She tolerated iron supplement well. She had periodic hemorrhoidal bleeding. She was found to have abnormal CBC from intermittently over the years by her primary care doctor She has started taking oral iron supplements since November of last year Most recently,  she have noted passage of dark stool She attempted to increase her oral iron supplement but still complain of excessive fatigue Recently, her repeat CBC has dropped from 11-8.2 and hence she  was referred back here for further evaluation  She denies recent chest pain on exertion, shortness of breath on minimal exertion, pre-syncopal episodes, or palpitations.  The patient denies over the counter NSAID ingestion. She is not on antiplatelets agents. Her last colonoscopy was in 2013 She had no prior history or diagnosis of cancer. Her age appropriate screening programs are up-to-date. She denies any pica and eats a variety of diet.  The patient was prescribed oral iron supplements and she takes 1 dose of oral iron supplement at night to avoid constipation In September 2021, she received 2 doses of intravenous iron infusion  I have reviewed the past medical history, past surgical history, social history and family history with the patient and they are unchanged from previous note.  ALLERGIES:  is allergic to sulfamethoxazole, nsaids, yellow jacket venom [bee venom], and sulfa antibiotics.  MEDICATIONS:  Current Outpatient Medications  Medication Sig Dispense Refill  . amLODipine (NORVASC) 5 MG tablet Take 1 tablet (5 mg total) by mouth daily. 90 tablet 3  . Calcium Citrate-Vitamin D (CITRACAL + D PO) Take 1 tablet by mouth daily with lunch. 500 mg calcium (2 X 250 mg)    . Magnesium 500 MG TABS Take 500 mg by mouth daily with lunch.     . Menatetrenone (VITAMIN K2) 100 MCG TABS Take 100 mcg by mouth daily.    . Multiple Vitamin (MULTIVITAMIN WITH MINERALS) TABS tablet Take 1 tablet by mouth daily. One a Day - Women's    . PEG-KCl-NaCl-NaSulf-Na Asc-C (PLENVU) 140 g SOLR Take 1 kit by mouth as directed. 1 each 0  . vitamin C (ASCORBIC ACID) 500 MG tablet Take 500 mg by mouth at bedtime.      Current Facility-Administered Medications  Medication Dose Route Frequency Provider Last Rate Last Admin  . cloNIDine (CATAPRES) tablet 0.1 mg  0.1 mg Oral Once Dorothyann Peng, NP       Facility-Administered Medications Ordered in Other Visits  Medication Dose Route Frequency Provider Last  Rate Last Admin  . 0.9 %  sodium chloride infusion   Intravenous Continuous [name], MD 20 mL/hr at 02/13/20 1102 Rate Verify at 02/13/20 1102     REVIEW OF SYSTEMS:   Constitutional: Denies fevers, chills or night sweats Eyes: Denies blurriness of vision Ears, nose, mouth, throat, and face: Denies mucositis or sore throat Respiratory: Denies cough, dyspnea or wheezes Cardiovascular: Denies palpitation, chest discomfort or lower extremity swelling Gastrointestinal:  Denies nausea, heartburn or change in bowel habits Skin: Denies abnormal skin rashes Lymphatics: Denies new lymphadenopathy or easy bruising Neurological:Denies numbness, tingling or new weaknesses Behavioral/Psych: Mood is stable, no new changes  All other systems were reviewed with the patient and are negative.  PHYSICAL EXAMINATION: ECOG PERFORMANCE STATUS: 0 - Asymptomatic  Vitals:   02/13/20 0918  BP: (!) 141/74  Pulse: 83  Resp: 18  Temp: (!) 97.2 F (36.2 C)  SpO2: 100%   Filed Weights   02/13/20 0918  Weight: 152 lb 9.6 oz (69.2 kg)    GENERAL:alert, no distress and comfortable Musculoskeletal:no cyanosis of digits and no clubbing  NEURO: alert & oriented x 3 with fluent speech, no focal motor/sensory deficits  LABORATORY DATA:  I have reviewed the data as listed     Component Value Date/Time  NA 139 12/06/2019 1147   NA 142 05/28/2012 1600   K 4.4 12/06/2019 1147   K 3.7 05/28/2012 1600   CL 102 12/06/2019 1147   CL 103 05/28/2012 1600   CO2 31 12/06/2019 1147   CO2 29 05/28/2012 1600   GLUCOSE 86 12/06/2019 1147   GLUCOSE 99 05/28/2012 1600   BUN 17 12/06/2019 1147   BUN 19.3 05/28/2012 1600   CREATININE 0.88 12/06/2019 1147   CREATININE 0.8 05/28/2012 1600   CALCIUM 9.9 12/06/2019 1147   CALCIUM 9.5 05/28/2012 1600   PROT 7.1 12/06/2019 1147   PROT 7.3 05/28/2012 1600   ALBUMIN 4.4 11/21/2018 1215   ALBUMIN 3.5 05/28/2012 1600   AST 21 12/06/2019 1147   AST 23 05/28/2012 1600    ALT 13 12/06/2019 1147   ALT 13 05/28/2012 1600   ALKPHOS 86 11/21/2018 1215   ALKPHOS 82 05/28/2012 1600   BILITOT 0.4 12/06/2019 1147   BILITOT <0.20 05/28/2012 1600   GFRNONAA >60 04/23/2018 1634   GFRAA >60 04/23/2018 1634    No results found for: SPEP, UPEP  Lab Results  Component Value Date   WBC 3.8 (L) 02/13/2020   NEUTROABS 2.3 02/13/2020   HGB 11.4 (L) 02/13/2020   HCT 36.9 02/13/2020   MCV 91.3 02/13/2020   PLT 161 02/13/2020      Chemistry      Component Value Date/Time   NA 139 12/06/2019 1147   NA 142 05/28/2012 1600   K 4.4 12/06/2019 1147   K 3.7 05/28/2012 1600   CL 102 12/06/2019 1147   CL 103 05/28/2012 1600   CO2 31 12/06/2019 1147   CO2 29 05/28/2012 1600   BUN 17 12/06/2019 1147   BUN 19.3 05/28/2012 1600   CREATININE 0.88 12/06/2019 1147   CREATININE 0.8 05/28/2012 1600      Component Value Date/Time   CALCIUM 9.9 12/06/2019 1147   CALCIUM 9.5 05/28/2012 1600   ALKPHOS 86 11/21/2018 1215   ALKPHOS 82 05/28/2012 1600   AST 21 12/06/2019 1147   AST 23 05/28/2012 1600   ALT 13 12/06/2019 1147   ALT 13 05/28/2012 1600   BILITOT 0.4 12/06/2019 1147   BILITOT <0.20 05/28/2012 1600

## 2020-02-14 NOTE — Telephone Encounter (Signed)
If you are not actively bleeding ok to stop the iron temporarily as she says

## 2020-02-19 ENCOUNTER — Ambulatory Visit (INDEPENDENT_AMBULATORY_CARE_PROVIDER_SITE_OTHER): Payer: Medicare Other | Admitting: Obstetrics & Gynecology

## 2020-02-19 ENCOUNTER — Encounter: Payer: Self-pay | Admitting: Obstetrics & Gynecology

## 2020-02-19 ENCOUNTER — Other Ambulatory Visit: Payer: Self-pay

## 2020-02-19 VITALS — BP 140/82

## 2020-02-19 DIAGNOSIS — Z4689 Encounter for fitting and adjustment of other specified devices: Secondary | ICD-10-CM

## 2020-02-19 DIAGNOSIS — D539 Nutritional anemia, unspecified: Secondary | ICD-10-CM | POA: Diagnosis not present

## 2020-02-19 DIAGNOSIS — N393 Stress incontinence (female) (male): Secondary | ICD-10-CM | POA: Diagnosis not present

## 2020-02-19 NOTE — Progress Notes (Signed)
    Belinda Harper 02/04/1941 [id number]        79 y.o.  G3P2A1L2   RP: Pessary maintenance  HPI: Doing much better with small pessary, now with Milex ring #5 with support.  No vaginal discharge, no bleeding, no vaginal pain.  Urine/BMs normal.   OB History  Gravida Para Term Preterm AB Living  3 2     1 2   SAB TAB Ectopic Multiple Live Births  1            # Outcome Date GA Lbr Len/2nd Weight Sex Delivery Anes PTL Lv  3 SAB           2 Para           1 Para             Past medical history,surgical history, problem list, medications, allergies, family history and social history were all reviewed and documented in the EPIC chart.   Directed ROS with pertinent positives and negatives documented in the history of present illness/assessment and plan.  Exam:  Vitals:   02/19/20 0951  BP: 140/82   General appearance:  Normal  Abdomen: Normal  Gynecologic exam: Vulva normal.  Pessary removed easily, no abnormal discharge.  Pessary cleaned.  Vaginal exam revealed intact vaginal mucosa.  Pessary inserted in vagina easily, good fit.   Assessment/Plan:  79 y.o. [id number]   1. Encounter for pessary maintenance Doing very well with Milex ring #5 with support.  No complication.  Will f/u in 4 months for pessary maintenance.  2. Deficiency anemia Under investigation again for anemia.  Received IV iron.  Vitamin B12 normal at 631, hemoglobin 11.4 and ferritin high at 321 with iron slightly low at 38 on February 13 2020.  Upper endoscopy and colonoscopy recommended, but patient is hesitant as it was normal in the past when investigated for the same problem.  Since patient stopped taking iron supplement, her stools are normal color, not dark.  I recommended patient to decide with gastro and family physician as to the next step in her investigation.  If decides not to do the colonoscopy, recommend at least Cologuard for colon cancer screening.  [name] MD, 10:11 AM  02/19/2020

## 2020-02-28 NOTE — Telephone Encounter (Signed)
So  That is a hard ? To answer.  Since you had more anemia  The colonoscopy/endoscopy  is a better test. And is  diagnostic  Test.   colo guard is a screening test only  for people who dont have  any suspicion of a bleed  colon canceror  polyp  And if positive would go to colonoscopy anyway    I think you should discuss this  With the GI  Team and you anxiety hesitancy about  The advised testing .   Glad the iron infusions  Helped so far

## 2020-03-02 NOTE — Telephone Encounter (Signed)
Make OV with me

## 2020-03-03 ENCOUNTER — Ambulatory Visit (INDEPENDENT_AMBULATORY_CARE_PROVIDER_SITE_OTHER): Payer: Medicare Other

## 2020-03-03 ENCOUNTER — Other Ambulatory Visit: Payer: Self-pay

## 2020-03-03 ENCOUNTER — Encounter: Payer: Self-pay | Admitting: Internal Medicine

## 2020-03-03 ENCOUNTER — Ambulatory Visit (INDEPENDENT_AMBULATORY_CARE_PROVIDER_SITE_OTHER): Payer: Medicare Other | Admitting: Internal Medicine

## 2020-03-03 VITALS — BP 130/60 | HR 66 | Temp 99.0°F | Ht 69.0 in | Wt 153.6 lb

## 2020-03-03 DIAGNOSIS — R002 Palpitations: Secondary | ICD-10-CM

## 2020-03-03 DIAGNOSIS — R0789 Other chest pain: Secondary | ICD-10-CM

## 2020-03-03 NOTE — Patient Instructions (Addendum)
So exam is good today  Get chest x ray    Poss pulled muslce or chest wall pain and not related . Fluttering  Sounds like  irreg heart rythym som may be benign and some  Need  To be evaluated  .   EKG     No acute abnormalites   Plan  Cardiology  evaluation   And often get a monitor   To  Evaluate .      Palpitations Palpitations are feelings that your heartbeat is irregular or is faster than normal. It may feel like your heart is fluttering or skipping a beat. Palpitations are usually not a serious problem. They may be caused by many things, including smoking, caffeine, alcohol, stress, and certain medicines or drugs. Most causes of palpitations are not serious. However, some palpitations can be a sign of a serious problem. You may need further tests to rule out serious medical problems. Follow these instructions at home:     Pay attention to any changes in your condition. Take these actions to help manage your symptoms: Eating and drinking  Avoid foods and drinks that may cause palpitations. These may include: ? Caffeinated coffee, tea, soft drinks, diet pills, and energy drinks. ? Chocolate. ? Alcohol. Lifestyle  Take steps to reduce your stress and anxiety. Things that can help you relax include: ? Yoga. ? Mind-body activities, such as deep breathing, meditation, or using words and images to create positive thoughts (guided imagery). ? Physical activity, such as swimming, jogging, or walking. Tell your health care provider if your palpitations increase with activity. If you have chest pain or shortness of breath with activity, do not continue the activity until you are seen by your health care provider. ? Biofeedback. This is a method that helps you learn to use your mind to control things in your body, such as your heartbeat.  Do not use drugs, including cocaine or ecstasy. Do not use marijuana.  Get plenty of rest and sleep. Keep a regular bed time. General  instructions  Take over-the-counter and prescription medicines only as told by your health care provider.  Do not use any products that contain nicotine or tobacco, such as cigarettes and e-cigarettes. If you need help quitting, ask your health care provider.  Keep all follow-up visits as told by your health care provider. This is important. These may include visits for further testing if palpitations do not go away or get worse. Contact a health care provider if you:  Continue to have a fast or irregular heartbeat after 24 hours.  Notice that your palpitations occur more often. Get help right away if you:  Have chest pain or shortness of breath.  Have a severe headache.  Feel dizzy or you faint. Summary  Palpitations are feelings that your heartbeat is irregular or is faster than normal. It may feel like your heart is fluttering or skipping a beat.  Palpitations may be caused by many things, including smoking, caffeine, alcohol, stress, certain medicines, and drugs.  Although most causes of palpitations are not serious, some causes can be a sign of a serious medical problem.  Get help right away if you faint or have chest pain, shortness of breath, a severe headache, or dizziness. This information is not intended to replace advice given to you by your health care provider. Make sure you discuss any questions you have with your health care provider. Document Revised: 05/24/2017 Document Reviewed: 05/24/2017 Elsevier Patient Education  2020 [employer].

## 2020-03-03 NOTE — Progress Notes (Signed)
Chief Complaint  Patient presents with  . Follow-up    pulse rate goes up and down, high/ low    HPI: Belinda Harper 79 y.o. come in for   sda    Pain on side of riib cage without injury but doing furniture lifting   About  2 weeks ago and continuing and  On under shoulder blade and now both side and under rib cage.  Taking bra off helps  .  ? Muscle pull .  Tylenol   Hard to  Positional   Not effect breathing   Then  recent increase with  Fluttering  In chest without sob or syncope   Using  Pulse ox   To check  Fluctuation in pulse  From pulse ox   Checks off and on .w  Fluttering   Off and on  Will be in 30  Then 60 -100.  Did count for a minute  Fluttering  Off an on last about 20 30 minutes . More frequent.   Now  Now nervous about doing this.   Has had iron infusions  And now off oral iron. for now .  Still feels better with energy still  Concerned about getting colonoscopy .  ROS: See pertinent positives and negatives per HPI. No fever cough new meds   Past Medical History:  Diagnosis Date  . 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 ALLERGIC RHINITIS 01/03/2007  . 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 Anxiety   . ASTHMA 01/03/2007  . Blood transfusion 12 2010   hg 4 transfused 4 units  . DEPRESSION 01/03/2007   resolved  . Family history of adverse reaction to anesthesia    pt's mother as she gets older organs shut down   . FRACTURE, ANKLE, LEFT 04/10/2009  . Gastritis 2002  . GERD (gastroesophageal reflux disease)   . HEMORRHOIDS 05/19/2008  . HYPERCHOLESTEROLEMIA 05/19/2008  . Normal cardiac stress test    2014  . OSTEOARTHRITIS 01/03/2007  . OSTEOPOROSIS 01/03/2007   hx of actonbel use for at least 5 years   . Sepsis due to cellulitis ([place]) 02/05/2015  . Unspecified vitamin D deficiency 04/06/2007  . UTERINE PROLAPSE 04/06/2007    Family History  Problem Relation Age of Onset  . Hyperlipidemia Mother   . Heart disease Mother        CABG age  49  . Ovarian cancer Mother   . Uterine cancer Mother   . Arthritis Mother   . Osteoporosis Mother   . Heart disease Father        CABG age 17  . Arthritis Father   . Melanoma Father     Social History   Socioeconomic History  . Marital status: Divorced    Spouse name: Not on file  . Number of children: 2  . Years of education: Not on file  . Highest education level: Not on file  Occupational History  . Occupation: retired  Tobacco Use  . Smoking status: Never Smoker  . Smokeless tobacco: Never Used  Vaping Use  . Vaping Use: Never used  Substance and Sexual Activity  . Alcohol use: Yes    Comment: rare  . Drug use: No  . Sexual activity: Not Currently    Comment: 1st intercourse- 18, partners- 1   Other Topics Concern  . Not on file  Social History Narrative   HH of  1   To rent out room    Retired  program analyst   Never smoked    Pet cat allergic    Divorced   Regular exericise  walking mowing the lawnswimming   Silver sneakers    Social Determinants of Health   Financial Resource Strain: Low Risk   . Difficulty of Paying Living Expenses: Not hard at all  Food Insecurity: No Food Insecurity  . Worried About Charity fundraiser in the Last Year: Never true  . Ran Out of Food in the Last Year: Never true  Transportation Needs: No Transportation Needs  . Lack of Transportation (Medical): No  . Lack of Transportation (Non-Medical): No  Physical Activity: Inactive  . Days of Exercise per Week: 0 days  . Minutes of Exercise per Session: 0 min  Stress: No Stress Concern Present  . Feeling of Stress : Not at all  Social Connections: Moderately Integrated  . Frequency of Communication with Friends and Family: More than three times a week  . Frequency of Social Gatherings with Friends and Family: Three times a week  . Attends Religious Services: More than 4 times per year  . Active Member of Clubs or Organizations: Yes  . Attends Archivist Meetings:  1 to 4 times per year  . Marital Status: Divorced    Outpatient Medications Prior to Visit  Medication Sig Dispense Refill  . amLODipine (NORVASC) 5 MG tablet Take 1 tablet (5 mg total) by mouth daily. 90 tablet 3  . Calcium Citrate-Vitamin D (CITRACAL + D PO) Take 1 tablet by mouth daily with lunch. 500 mg calcium (2 X 250 mg)    . Cholecalciferol (VITAMIN D3 PO) Take by mouth.    . Magnesium 500 MG TABS Take 500 mg by mouth daily with lunch.     . Menatetrenone (VITAMIN K2) 100 MCG TABS Take 100 mcg by mouth daily.    . Multiple Vitamin (MULTIVITAMIN WITH MINERALS) TABS tablet Take 1 tablet by mouth daily. One a Day - Women's    . PEG-KCl-NaCl-NaSulf-Na Asc-C (PLENVU) 140 g SOLR Take 1 kit by mouth as directed. 1 each 0  . vitamin C (ASCORBIC ACID) 500 MG tablet Take 500 mg by mouth at bedtime.      Facility-Administered Medications Prior to Visit  Medication Dose Route Frequency Provider Last Rate Last Admin  . cloNIDine (CATAPRES) tablet 0.1 mg  0.1 mg Oral Once Nafziger, Tommi Rumps, NP         EXAM:  BP 130/60   Pulse 66   Temp 99 F (37.2 C) (Oral)   Ht 5' 9"  (1.753 m)   Wt 153 lb 9.6 oz (69.7 kg)   SpO2 100%   BMI 22.68 kg/m   Body mass index is 22.68 kg/m.  GENERAL: vitals reviewed and listed above, alert, oriented, appears well hydrated and in no acute distress HEENT: atraumatic, conjunctiva  clear, no obvious abnormalities on inspection of external nose and ears OP :masked NECK: no obvious masses on inspection palpation  LUNGS: clear to auscultation bilaterally, no wheezes, rales or rhonchi, good air movement tender lateral rib cage   CV: HRRR, no clubbing cyanosis or  peripheral edema nl cap refill  rr Abdomen:  Sof,t normal bowel sounds without hepatosplenomegaly, no guarding rebound or masses no CVA tenderness MS: moves all extremities without noticeable focal  abnormality PSYCH: pleasant and cooperative, cognition seems intact  Lab Results  Component Value Date    WBC 3.8 (L) 02/13/2020   HGB 11.4 (L) 02/13/2020   HCT 36.9 02/13/2020  PLT 161 02/13/2020   GLUCOSE 86 12/06/2019   CHOL 215 (H) 12/06/2019   TRIG 138 12/06/2019   HDL 55 12/06/2019   LDLDIRECT 141.9 08/06/2012   LDLCALC 133 (H) 12/06/2019   ALT 13 12/06/2019   AST 21 12/06/2019   NA 139 12/06/2019   K 4.4 12/06/2019   CL 102 12/06/2019   CREATININE 0.88 12/06/2019   BUN 17 12/06/2019   CO2 31 12/06/2019   TSH 1.00 12/06/2019   INR 1.04 04/10/2009   HGBA1C 5.6 11/21/2018   BP Readings from Last 3 Encounters:  03/03/20 130/60  02/19/20 140/82  02/13/20 (!) 132/56   EKG rr   t w inv v2 peaked p wave nl intervals  Sinus rhythym.  ASSESSMENT AND PLAN:  Discussed the following assessment and plan:  Fluttering sensation of heart - Plan: DG Chest 2 View, EKG 12-Lead, Ambulatory referral to Cardiology  Chest wall pain - Plan: EKG 12-Lead, Ambulatory referral to Cardiology Pain seems to be  cwp since  Positional  Aggravation  May not be related  Pulse rate on pulse  ox not accurate as point in time    prob needs cardiac monitoring to define . Iron deficiency  replete but  Should  Eventually get her colon   Follow anemia   -Patient advised to return or notify health care team  if  new concerns arise.  Patient Instructions  So exam is good today  Get chest x ray    Poss pulled muslce or chest wall pain and not related . Fluttering  Sounds like  irreg heart rythym som may be benign and some  Need  To be evaluated  .   EKG     No acute abnormalites   Plan  Cardiology  evaluation   And often get a monitor   To  Evaluate .      Palpitations Palpitations are feelings that your heartbeat is irregular or is faster than normal. It may feel like your heart is fluttering or skipping a beat. Palpitations are usually not a serious problem. They may be caused by many things, including smoking, caffeine, alcohol, stress, and certain medicines or drugs. Most causes of palpitations are  not serious. However, some palpitations can be a sign of a serious problem. You may need further tests to rule out serious medical problems. Follow these instructions at home:     Pay attention to any changes in your condition. Take these actions to help manage your symptoms: Eating and drinking  Avoid foods and drinks that may cause palpitations. These may include: ? Caffeinated coffee, tea, soft drinks, diet pills, and energy drinks. ? Chocolate. ? Alcohol. Lifestyle  Take steps to reduce your stress and anxiety. Things that can help you relax include: ? Yoga. ? Mind-body activities, such as deep breathing, meditation, or using words and images to create positive thoughts (guided imagery). ? Physical activity, such as swimming, jogging, or walking. Tell your health care provider if your palpitations increase with activity. If you have chest pain or shortness of breath with activity, do not continue the activity until you are seen by your health care provider. ? Biofeedback. This is a method that helps you learn to use your mind to control things in your body, such as your heartbeat.  Do not use drugs, including cocaine or ecstasy. Do not use marijuana.  Get plenty of rest and sleep. Keep a regular bed time. General instructions  Take over-the-counter and prescription medicines only as  told by your health care provider.  Do not use any products that contain nicotine or tobacco, such as cigarettes and e-cigarettes. If you need help quitting, ask your health care provider.  Keep all follow-up visits as told by your health care provider. This is important. These may include visits for further testing if palpitations do not go away or get worse. Contact a health care provider if you:  Continue to have a fast or irregular heartbeat after 24 hours.  Notice that your palpitations occur more often. Get help right away if you:  Have chest pain or shortness of breath.  Have a severe  headache.  Feel dizzy or you faint. Summary  Palpitations are feelings that your heartbeat is irregular or is faster than normal. It may feel like your heart is fluttering or skipping a beat.  Palpitations may be caused by many things, including smoking, caffeine, alcohol, stress, certain medicines, and drugs.  Although most causes of palpitations are not serious, some causes can be a sign of a serious medical problem.  Get help right away if you faint or have chest pain, shortness of breath, a severe headache, or dizziness. This information is not intended to replace advice given to you by your health care provider. Make sure you discuss any questions you have with your health care provider. Document Revised: 05/24/2017 Document Reviewed: 05/24/2017 Elsevier Patient Education  2020 [place] Derra Shartzer M.D.

## 2020-03-04 ENCOUNTER — Encounter: Payer: Medicare Other | Admitting: Gastroenterology

## 2020-03-05 NOTE — Progress Notes (Signed)
No  acute  active findings  on c xray .

## 2020-03-10 NOTE — Telephone Encounter (Signed)
Patient has returned call, we discussed that it was okay to postpone her colonoscopy at this time. She was okay in doing so. We also discussed that [facility] wanted her to schedule a follow up in 3 months which would be February. She will call the office next month to call and schedule a follow up appointment with either Dr. Silverio Decamp or Nicoletta Ba.

## 2020-03-10 NOTE — Telephone Encounter (Signed)
Called and left voicemail to have patient call the office

## 2020-03-23 ENCOUNTER — Other Ambulatory Visit: Payer: Self-pay

## 2020-03-23 ENCOUNTER — Ambulatory Visit (INDEPENDENT_AMBULATORY_CARE_PROVIDER_SITE_OTHER): Payer: Medicare Other | Admitting: Internal Medicine

## 2020-03-23 ENCOUNTER — Encounter: Payer: Self-pay | Admitting: Internal Medicine

## 2020-03-23 VITALS — BP 128/80 | HR 72 | Ht 67.0 in | Wt 157.8 lb

## 2020-03-23 DIAGNOSIS — R002 Palpitations: Secondary | ICD-10-CM | POA: Diagnosis not present

## 2020-03-23 DIAGNOSIS — I10 Essential (primary) hypertension: Secondary | ICD-10-CM

## 2020-03-23 DIAGNOSIS — R001 Bradycardia, unspecified: Secondary | ICD-10-CM | POA: Diagnosis not present

## 2020-03-23 NOTE — Patient Instructions (Signed)
Medication Instructions:  Your physician recommends that you continue on your current medications as directed. Please refer to the Current Medication list given to you today.  *If you need a refill on your cardiac medications before your next appointment, please call your pharmacy*   Testing/Procedures: [place] Monitor Instructions   Your physician has requested you wear your ZIO patch monitor 14 days.   This is a single patch monitor.  [employer] supplies one patch monitor per enrollment.  Additional stickers are not available.   Please do not apply patch if you will be having a Nuclear Stress Test, Echocardiogram, Cardiac CT, MRI, or Chest Xray during the time frame you would be wearing the monitor. The patch cannot be worn during these tests.  You cannot remove and re-apply the ZIO XT patch monitor.   Your ZIO patch monitor will be sent USPS Priority mail from [facility] directly to your home address. The monitor may also be mailed to a PO BOX if home delivery is not available.   It may take 3-5 days to receive your monitor after you have been enrolled.   Once you have received you monitor, please review enclosed instructions.  Your monitor has already been registered assigning a specific monitor serial # to you.   Applying the monitor   Shave hair from upper left chest.   Hold abrader disc by orange tab.  Rub abrader in 40 strokes over left upper chest as indicated in your monitor instructions.   Clean area with 4 enclosed alcohol pads .  Use all pads to assure are is cleaned thoroughly.  Let dry.   Apply patch as indicated in monitor instructions.  Patch will be place under collarbone on left side of chest with arrow pointing upward.   Rub patch adhesive wings for 2 minutes.Remove white label marked "1".  Remove white label marked "2".  Rub patch adhesive wings for 2 additional minutes.   While looking in a mirror, press and release button in center of patch.  A  small green light will flash 3-4 times .  This will be your only indicator the monitor has been turned on.     Do not shower for the first 24 hours.  You may shower after the first 24 hours.   Press button if you feel a symptom. You will hear a small click.  Record Date, Time and Symptom in the Patient Log Book.   When you are ready to remove patch, follow instructions on last 2 pages of Patient Log Book.  Stick patch monitor onto last page of Patient Log Book.   Place Patient Log Book in [place] box.  Use locking tab on box and tape box closed securely.  The [employer] and [employer] has [employer] on it.  Please place in mailbox as soon as possible.  Your physician should have your test results approximately 7 days after the monitor has been mailed back to [facility].   Call [facility] at [phone] if you have questions regarding your ZIO XT patch monitor.  Call them immediately if you see an orange light blinking on your monitor.   If your monitor falls off in less than 4 days contact our Monitor department at [phone].  If your monitor becomes loose or falls off after 4 days call [employer] at [phone] for suggestions on securing your monitor.     Follow-Up: At [facility], you and your health needs are our priority.  As  part of our continuing mission to provide you with exceptional heart care, we have created designated Provider Care Teams.  These Care Teams include your primary Cardiologist (physician) and Advanced Practice Providers (APPs -  Physician Assistants and Nurse Practitioners) who all work together to provide you with the care you need, when you need it.  We recommend signing up for the patient portal called "MyChart".  Sign up information is provided on this After Visit Summary.  MyChart is used to connect with patients for Virtual Visits (Telemedicine).  Patients are able to view lab/test results, encounter notes, upcoming appointments, etc.   Non-urgent messages can be sent to your provider as well.   To learn more about what you can do with MyChart, go to [email or web address].    Your next appointment:   1-2 months  The format for your next appointment:   In Person  Provider:   You may see Dr. Debara Pickett or one of the following Advanced Practice Providers on your designated Care Team:    Almyra Deforest, PA-C  Fabian Sharp, [place] or   Roby Lofts, [place]    Other Instructions

## 2020-03-23 NOTE — Progress Notes (Signed)
 OFFICE CONSULT NOTE  Chief Complaint:  Palpitations  Primary Care Physician: Panosh, Wanda K, MD  HPI:  Belinda Harper is a 79 y.o. female who is being seen today for the evaluation of potation at the request of Panosh, Wanda K, MD.  This is a very pleasant 79-year-old female kindly referred for evaluation of palpitations.  Her son is also a patient of mine.  She had been seen remotely by Dr. Crenshaw in 2014 for dyspnea.  She underwent a Myoview stress test which showed normal perfusion and EF was 68%.  There is a history of coronary disease in her family including her father who had 5 vessel CABG in mother who had coronary disease.  She has recently been having episodes of what sound like palpitations or awareness of her heart.  These episodes are infrequent but have more recently happened a few times a week.  They are associated with a feeling of quivering in her chest.  She does not describe any significant chest pain associated with this.  In addition she purchased a pulse oximeter and noted that her heart rate at times would be very slow in the 30s.  She thought that it might be defective and got another 1 which also occasionally showed episodes of intermittent bradycardia.  Heart rate today is normal.  EKG shows normal sinus rhythm at 72.  PMHx:  Past Medical History:  Diagnosis Date  . 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 ALLERGIC RHINITIS 01/03/2007  . 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 Anxiety   . ASTHMA 01/03/2007  . Blood transfusion 12 2010   hg 4 transfused 4 units  . DEPRESSION 01/03/2007   resolved  . Family history of adverse reaction to anesthesia    pt's mother as she gets older organs shut down   . FRACTURE, ANKLE, LEFT 04/10/2009  . Gastritis 2002  . GERD (gastroesophageal reflux disease)   . HEMORRHOIDS 05/19/2008  . HYPERCHOLESTEROLEMIA 05/19/2008  . Normal cardiac stress test    2014  . OSTEOARTHRITIS 01/03/2007  . OSTEOPOROSIS 01/03/2007    hx of actonbel use for at least 5 years   . Sepsis due to cellulitis (HCC) 02/05/2015  . Unspecified vitamin D deficiency 04/06/2007  . UTERINE PROLAPSE 04/06/2007    Past Surgical History:  Procedure Laterality Date  . ANKLE FRACTURE SURGERY  02/2009   left ankle fracture and dislocation  . CATARACT EXTRACTION Left    last year   . CATARACT EXTRACTION W/ INTRAOCULAR LENS IMPLANT  2000   right eye  . HARDWARE REMOVAL Left 02/21/2015   Procedure: IRRIGATION AND DEBRIDEMENT HARDWARE REMOVAL LEFT ANKLE ;  Surgeon: Frank Aluisio, MD;  Location: [facility];  Service: Orthopedics;  Laterality: Left;  . 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 ORIF FINGER / THUMB FRACTURE  1970   reattached  . PANENDOSCOPY     small bowel bx    FAMHx:  Family History  Problem Relation Age of Onset  . Hyperlipidemia Mother   . Heart disease Mother        CABG age 70  . Ovarian cancer Mother   . Uterine cancer Mother   . Arthritis Mother   . Osteoporosis Mother   . Heart disease Father        CABG age 70  .   Arthritis Father   . Melanoma Father     SOCHx:   reports that she has never smoked. She has never used smokeless tobacco. She reports current alcohol use. She reports that she does not use drugs.  ALLERGIES:  Allergies  Allergen Reactions  . Sulfamethoxazole Rash  . Nsaids Other (See Comments)    Drops hemoglobin levels  . Sulfa Antibiotics Rash    ROS: Pertinent items noted in HPI and remainder of comprehensive ROS otherwise negative.  HOME MEDS: Current Outpatient Medications on File Prior to Visit  Medication Sig Dispense Refill  . amLODipine (NORVASC) 5 MG tablet Take 1 tablet (5 mg total) by mouth daily. 90 tablet 3  . Calcium Citrate-Vitamin D (CITRACAL + D PO) Take 1 tablet by mouth daily  with lunch. 500 mg calcium (2 X 250 mg)    . Cholecalciferol (VITAMIN D3 PO) Take by mouth.    . Magnesium 500 MG TABS Take 500 mg by mouth daily with lunch.     . Menatetrenone (VITAMIN K2) 100 MCG TABS Take 100 mcg by mouth daily.    . Multiple Vitamin (MULTIVITAMIN WITH MINERALS) TABS tablet Take 1 tablet by mouth daily. One a Day - Women's    . PEG-KCl-NaCl-NaSulf-Na Asc-C (PLENVU) 140 g SOLR Take 1 kit by mouth as directed. 1 each 0  . vitamin C (ASCORBIC ACID) 500 MG tablet Take 500 mg by mouth at bedtime.      Current Facility-Administered Medications on File Prior to Visit  Medication Dose Route Frequency Provider Last Rate Last Admin  . cloNIDine (CATAPRES) tablet 0.1 mg  0.1 mg Oral Once Nafziger, Cory, NP        LABS/IMAGING: No results found for this or any previous visit (from the past 48 hour(s)). No results found.  LIPID PANEL:    Component Value Date/Time   CHOL 215 (H) 12/06/2019 1147   TRIG 138 12/06/2019 1147   TRIG 64 03/24/2006 0820   HDL 55 12/06/2019 1147   CHOLHDL 3.9 12/06/2019 1147   VLDL 16.6 11/21/2018 1215   LDLCALC 133 (H) 12/06/2019 1147   LDLDIRECT 141.9 08/06/2012 1041    WEIGHTS: Wt Readings from Last 3 Encounters:  03/23/20 157 lb 12.8 oz (71.6 kg)  03/03/20 153 lb 9.6 oz (69.7 kg)  02/13/20 152 lb 9.6 oz (69.2 kg)    VITALS: BP 128/80   Pulse 72   Ht 5' 7" (1.702 m)   Wt 157 lb 12.8 oz (71.6 kg)   SpO2 98%   BMI 24.71 kg/m   EXAM: General appearance: alert and no distress Neck: no carotid bruit, no JVD and thyroid not enlarged, symmetric, no tenderness/mass/nodules Lungs: clear to auscultation bilaterally Heart: regular rate and rhythm, S1, S2 normal and systolic murmur: early systolic 2/6, crescendo at 2nd right intercostal space Abdomen: soft, non-tender; bowel sounds normal; no masses,  no organomegaly Extremities: extremities normal, atraumatic, no cyanosis or edema Pulses: 2+ and symmetric Skin: Skin color, texture, turgor  normal. No rashes or lesions Neurologic: Grossly normal Psych: Pleasant  EKG: Normal sinus rhythm at 72, biatrial enlargement- personally reviewed  ASSESSMENT: 1. Palpitations with intermittent bradycardia 2. Hypertension 3. Family history of coronary disease  PLAN: 1.   Ms. Harville is describing palpitations with some intermittent bradycardia noted on a pulse oximeter.  There is no evidence of bradycardia today or conduction delay on her EKG.  She is on no AV nodal blocking agents.  I would recommend a 2-week Zio patch monitor to   see if we can pick up on any episodes of bradycardia or perhaps slow atrial fibrillation.  Blood pressure appears to be well controlled.  There is a strong family history of heart disease in her parents as well as her son.  She really is not describing any symptoms concerning for angina and is active.  Depending on her monitor results, she may need an ischemic evaluation as well.  He did have perfusion imaging back in 2014 by Dr. Crenshaw which was negative for ischemia as well as a Myoview in 2008 which was negative.  Plan follow-up with me to review her monitor results.  Thanks again for the kind referral.  Kenneth C. Hilty, MD, FACC, FACP  [place]  [facility]  Medical Director of the [facility]  [facility] Diplomate of the American Board of Clinical Lipidology Attending Cardiologist  Direct Dial: [phone]  Fax: [phone]  Website:  [email or web address]  Kenneth C Hilty 03/23/2020, 9:30 PM 

## 2020-03-27 ENCOUNTER — Ambulatory Visit (INDEPENDENT_AMBULATORY_CARE_PROVIDER_SITE_OTHER): Payer: Medicare Other

## 2020-03-27 ENCOUNTER — Encounter: Payer: Medicare Other | Admitting: Gastroenterology

## 2020-03-27 DIAGNOSIS — R001 Bradycardia, unspecified: Secondary | ICD-10-CM

## 2020-03-27 DIAGNOSIS — R002 Palpitations: Secondary | ICD-10-CM | POA: Diagnosis not present

## 2020-04-23 ENCOUNTER — Other Ambulatory Visit: Payer: Self-pay | Admitting: *Deleted

## 2020-04-23 MED ORDER — METOPROLOL SUCCINATE ER 25 MG PO TB24: 12.5000 mg | ORAL_TABLET | Freq: Every day | ORAL | 3 refills | Status: DC

## 2020-04-27 ENCOUNTER — Telehealth: Payer: Self-pay | Admitting: Internal Medicine

## 2020-04-27 NOTE — Telephone Encounter (Signed)
Patient has questions about a prescription that was written by Julaine Fusi - She states she is unsure if she needs her appointment later in January now and is requesting to speak with [place]. Please advise  Thank you

## 2020-04-27 NOTE — Telephone Encounter (Signed)
Spoke with patient and she has not started Toprol yet but did pick up She will start Toprol and keep follow up as scheduled, unsure if needed follow up visit

## 2020-05-21 ENCOUNTER — Other Ambulatory Visit: Payer: Self-pay

## 2020-05-21 ENCOUNTER — Ambulatory Visit (INDEPENDENT_AMBULATORY_CARE_PROVIDER_SITE_OTHER): Payer: Medicare Other | Admitting: Physician Assistant

## 2020-05-21 VITALS — BP 144/70 | HR 61 | Ht 67.0 in | Wt 160.4 lb

## 2020-05-21 DIAGNOSIS — I471 Supraventricular tachycardia: Secondary | ICD-10-CM | POA: Diagnosis not present

## 2020-05-21 DIAGNOSIS — I10 Essential (primary) hypertension: Secondary | ICD-10-CM | POA: Diagnosis not present

## 2020-05-21 NOTE — Patient Instructions (Addendum)
Medication Instructions:  The current medical regimen is effective;  continue present plan and medications as directed. Please refer to the Current Medication list given to you today.   *If you need a refill on your cardiac medications before your next appointment, please call your pharmacy*  Lab Work:   Testing/Procedures:  NONE    NONE  Follow-Up: Your next appointment:  4-5 month(s) In Person with K. Mali Hilty, MD OR IF UNAVAILABLE HAO MENG, PA-C  At [facility], you and your health needs are our priority.  As part of our continuing mission to provide you with exceptional heart care, we have created designated Provider Care Teams.  These Care Teams include your primary Cardiologist (physician) and Advanced Practice Providers (APPs -  Physician Assistants and Nurse Practitioners) who all work together to provide you with the care you need, when you need it.

## 2020-05-21 NOTE — Progress Notes (Signed)
Cardiology Office Note:    Date:  05/24/2020   ID:  Belinda Harper, DOB 08-Mar-1941, MRN [id number]  PCP:  Burnis Medin, MD  [place] Cardiologist:  Pixie Casino, MD  [facility] Electrophysiologist:  None   Referring MD: Burnis Medin, MD   Chief Complaint  Patient presents with  . Follow-up    Seen for Dr. Debara Pickett    History of Present Illness:    Belinda Harper is a 80 y.o. female with a hx of hypertension and hyperlipidemia.  She was remotely seen by Dr. Stanford Breed in 2014 for dyspnea.  Myoview at the time was negative with EF of 68%.  She has a family history of coronary artery disease but her father having 5 vessel CABG. more recently, patient was seen by Dr. Debara Pickett in November 2021 with intermittent palpitation with bradycardia some has a slowest in the 30s.  There was no evidence of bradycardia on the EKG.  She was not on any AV nodal blocking agent.  A 2-week heart monitor was recommended.  Her monitor showed multiple SVT episodes which are likely the cause of her palpitation.  Slowest heart rate was in the upper 40s and this occurred at night.  Therefore Dr. Debara Pickett recommended at 12.5 mg daily of Toprol-XL at night to suppress her palpitation.  Patient presents today for follow-up.  We reviewed the heart monitor result.  Since starting on the low-dose Toprol-XL, she has not had any dizziness, blurred vision or feeling of passing out.  She has not had any significant palpitation either.  On exam, she has no lower extremity edema she also denies any orthopnea or PND.  She can follow-up with Dr. Debara Pickett in 4 to 5 months.   Past Medical History:  Diagnosis Date  . 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 ALLERGIC RHINITIS 01/03/2007  . 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 Anxiety   . ASTHMA 01/03/2007  . Blood transfusion 12 2010   hg 4 transfused 4 units  . DEPRESSION 01/03/2007   resolved  . Family history of adverse reaction to anesthesia    pt's  mother as she gets older organs shut down   . FRACTURE, ANKLE, LEFT 04/10/2009  . Gastritis 2002  . GERD (gastroesophageal reflux disease)   . HEMORRHOIDS 05/19/2008  . HYPERCHOLESTEROLEMIA 05/19/2008  . Normal cardiac stress test    2014  . OSTEOARTHRITIS 01/03/2007  . OSTEOPOROSIS 01/03/2007   hx of actonbel use for at least 5 years   . Sepsis due to cellulitis ([place]) 02/05/2015  . Unspecified vitamin D deficiency 04/06/2007  . UTERINE PROLAPSE 04/06/2007    Past Surgical History:  Procedure Laterality Date  . ANKLE FRACTURE SURGERY  02/2009   left ankle fracture and dislocation  . CATARACT EXTRACTION Left    last year   . CATARACT EXTRACTION W/ INTRAOCULAR LENS IMPLANT  2000   right eye  . 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 ORIF FINGER / THUMB FRACTURE  1970   reattached  . PANENDOSCOPY     small bowel bx    Current Medications: Current Meds  Medication Sig  . amLODipine (NORVASC) 5 MG tablet Take 1 tablet (5 mg total) by mouth daily.  . Calcium Citrate-Vitamin D (CITRACAL + D PO) Take 1 tablet by mouth daily with lunch. 500 mg calcium (2 X 250 mg)  . Cholecalciferol (VITAMIN D3 PO) Take by mouth.  . Magnesium 500 MG TABS Take 500 mg by mouth daily with lunch.   . Menatetrenone (VITAMIN K2) 100 MCG TABS Take 100 mcg by mouth daily.  . metoprolol succinate (TOPROL-XL) 25 MG 24 hr tablet Take 0.5 tablets (12.5 mg total) by mouth at bedtime.  . Multiple Vitamin (MULTIVITAMIN WITH MINERALS) TABS tablet Take 1 tablet by mouth daily. One a Day - Women's  . PEG-KCl-NaCl-NaSulf-Na Asc-C (PLENVU) 140 g  SOLR Take 1 kit by mouth as directed.  . vitamin C (ASCORBIC ACID) 500 MG tablet Take 500 mg by mouth at bedtime.    Current Facility-Administered Medications for the 05/21/20 encounter (Office Visit) with Almyra Deforest, PA  Medication  . cloNIDine (CATAPRES) tablet 0.1 mg     Allergies:   Sulfamethoxazole, Nsaids, and Sulfa antibiotics   Social History   Socioeconomic History  . Marital status: Divorced    Spouse name: Not on file  . Number of children: 2  . Years of education: Not on file  . Highest education level: Not on file  Occupational History  . Occupation: retired  Tobacco Use  . Smoking status: Never Smoker  . Smokeless tobacco: Never Used  Vaping Use  . Vaping Use: Never used  Substance and Sexual Activity  . Alcohol use: Yes    Comment: rare  . Drug use: No  . Sexual activity: Not Currently    Comment: 1st intercourse- 18, partners- 1   Other Topics Concern  . Not on file  Social History Narrative   HH of  1   To rent out room    Retired Dispensing optician   Never smoked    Pet cat allergic    Divorced   Regular exericise  walking mowing the lawnswimming   Silver sneakers    Social Determinants of Health   Financial Resource Strain: Low Risk   . Difficulty of Paying Living Expenses: Not hard at all  Food Insecurity: No Food Insecurity  . Worried About Charity fundraiser in the Last Year: Never true  . Ran Out of Food in the Last Year: Never true  Transportation Needs: No Transportation Needs  . Lack of Transportation (Medical): No  . Lack of Transportation (Non-Medical): No  Physical Activity: Inactive  . Days of Exercise per Week: 0 days  . Minutes of Exercise per Session: 0 min  Stress: No Stress Concern Present  . Feeling of Stress : Not at all  Social Connections: Moderately Integrated  . Frequency of Communication with Friends and Family: More than three times a week  . Frequency of Social Gatherings with Friends and Family: Three times a week  .  Attends Religious Services: More than 4 times per year  . Active Member of Clubs or Organizations: Yes  . Attends Archivist Meetings: 1 to 4 times per year  . Marital Status: Divorced     Family History: The patient's family history includes Arthritis in her father and mother; Heart disease in her father and mother; Hyperlipidemia in her mother; Melanoma in her father; Osteoporosis in her mother; Ovarian cancer in her mother; Uterine cancer in her mother.  ROS:   Please see the history of  present illness.     All other systems reviewed and are negative.  EKGs/Labs/Other Studies Reviewed:    The following studies were reviewed today:  Echo 02/09/2015 LV EF: 60% -  65%  Study Conclusions   - Left ventricle: The cavity size was normal. There was mild  concentric hypertrophy. Systolic function was normal. The  estimated ejection fraction was in the range of 60% to 65%. Wall  motion was normal; there were no regional wall motion  abnormalities.  - Aortic valve: Trileaflet; normal thickness, mildly calcified  leaflets.  - Mitral valve: There was mild regurgitation.  - Tricuspid valve: There was mild regurgitation.    EKG:  EKG is not ordered today.    Recent Labs: 12/06/2019: ALT 13; BUN 17; Creat 0.88; Potassium 4.4; Sodium 139; TSH 1.00 02/13/2020: Hemoglobin 11.4; Platelets 161  Recent Lipid Panel    Component Value Date/Time   CHOL 215 (H) 12/06/2019 1147   TRIG 138 12/06/2019 1147   TRIG 64 03/24/2006 0820   HDL 55 12/06/2019 1147   CHOLHDL 3.9 12/06/2019 1147   VLDL 16.6 11/21/2018 1215   LDLCALC 133 (H) 12/06/2019 1147   LDLDIRECT 141.9 08/06/2012 1041     Risk Assessment/Calculations:       Physical Exam:    VS:  BP (!) 144/70 (BP Location: Left Arm, Patient Position: Sitting, Cuff Size: Normal)   Pulse 61   Ht _0  (1.702 m)   Wt 160 lb 6.4 oz (72.8 kg)   SpO2 100%   BMI 25.12 kg/m     Wt Readings from Last 3 Encounters:   05/21/20 160 lb 6.4 oz (72.8 kg)  03/23/20 157 lb 12.8 oz (71.6 kg)  03/03/20 153 lb 9.6 oz (69.7 kg)     GEN:  Well nourished, well developed in no acute distress HEENT: Normal NECK: No JVD; No carotid bruits LYMPHATICS: No lymphadenopathy CARDIAC: RRR, no murmurs, rubs, gallops RESPIRATORY:  Clear to auscultation without rales, wheezing or rhonchi  ABDOMEN: Soft, non-tender, non-distended MUSCULOSKELETAL:  No edema; No deformity  SKIN: Warm and dry NEUROLOGIC:  Alert and oriented x 3 PSYCHIATRIC:  Normal affect   ASSESSMENT:    1. SVT (supraventricular tachycardia) ([place])   2. Essential hypertension    PLAN:    In order of problems listed above:  1. SVT: Noted on recent cardiac monitor patient has been having frequent bursts of SVT.  She was started on metoprolol succinate 12.5 mg daily.  She has not noticed significant palpitation recently.  We will continue on current therapy  2. Hypertension: Blood pressure borderline elevated today, if continue to be elevated in the future we will consider further increase of blood pressure medication         Medication Adjustments/Labs and Tests Ordered: Current medicines are reviewed at length with the patient today.  Concerns regarding medicines are outlined above.  No orders of the defined types were placed in this encounter.  No orders of the defined types were placed in this encounter.   Patient Instructions  Medication Instructions:  The current medical regimen is effective;  continue present plan and medications as directed. Please refer to the Current Medication list given to you today.   *If you need a refill on your cardiac medications before your next appointment, please call your pharmacy*  Lab Work:   Testing/Procedures:  NONE    NONE  Follow-Up: Your next appointment:  4-5 month(s) In Person with K. Mali Hilty, MD OR IF UNAVAILABLE Dailan Pfalzgraf  Ajeenah Heiny, PA-C  At [facility], you and your health needs are our  priority.  As part of our continuing mission to provide you with exceptional heart care, we have created designated Provider Care Teams.  These Care Teams include your primary Cardiologist (physician) and Advanced Practice Providers (APPs -  Physician Assistants and Nurse Practitioners) who all work together to provide you with the care you need, when you need it.     Hilbert Corrigan, [place]  05/24/2020 11:19 AM    [place]

## 2020-05-24 ENCOUNTER — Encounter: Payer: Self-pay | Admitting: Physician Assistant

## 2020-06-09 NOTE — Progress Notes (Signed)
Chief Complaint  Patient presents with  . Follow-up    F/u heart fluttering, wants to check iron level     HPI: Belinda Harper 80 y.o. come in for follow-up anemia has had an iron infusion iron deficiency not taking oral because the oral was not working anymore.  Had cards eval  goune episodes of svt and put on b bl;ocker  bp up some   Hemorrhoids did have an 8-day bleed after the infusion not current.  Does not really want to go through another colonoscopy  No recent fracture with falls had DEXA scan but never got the final result.  ROS: See pertinent positives and negatives per HPI.  Gets occasional dizziness vertigo that is short lasting  Past Medical History:  Diagnosis Date  . 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 ALLERGIC RHINITIS 01/03/2007  . 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 Anxiety   . ASTHMA 01/03/2007  . Blood transfusion 12 2010   hg 4 transfused 4 units  . DEPRESSION 01/03/2007   resolved  . Family history of adverse reaction to anesthesia    pt's mother as she gets older organs shut down   . FRACTURE, ANKLE, LEFT 04/10/2009  . Gastritis 2002  . GERD (gastroesophageal reflux disease)   . HEMORRHOIDS 05/19/2008  . HYPERCHOLESTEROLEMIA 05/19/2008  . Normal cardiac stress test    2014  . OSTEOARTHRITIS 01/03/2007  . OSTEOPOROSIS 01/03/2007   hx of actonbel use for at least 5 years   . Sepsis due to cellulitis ([place]) 02/05/2015  . Unspecified vitamin D deficiency 04/06/2007  . UTERINE PROLAPSE 04/06/2007    Family History  Problem Relation Age of Onset  . Hyperlipidemia Mother   . Heart disease Mother        CABG age 58  . Ovarian cancer Mother   . Uterine cancer Mother   . Arthritis Mother   . Osteoporosis Mother   . Heart disease Father        CABG age 20  . Arthritis Father   . Melanoma Father     Social History   Socioeconomic History  . Marital status: Divorced    Spouse name: Not on file  . Number of children: 2   . Years of education: Not on file  . Highest education level: Not on file  Occupational History  . Occupation: retired  Tobacco Use  . Smoking status: Never Smoker  . Smokeless tobacco: Never Used  Vaping Use  . Vaping Use: Never used  Substance and Sexual Activity  . Alcohol use: Yes    Comment: rare  . Drug use: No  . Sexual activity: Not Currently    Comment: 1st intercourse- 18, partners- 1   Other Topics Concern  . Not on file  Social History Narrative   HH of  1   To rent out room    Retired Dispensing optician   Never smoked    Pet cat allergic    Divorced   Regular exericise  walking mowing the lawnswimming   Silver sneakers    Social Determinants of Health   Financial Resource Strain: Low Risk   . Difficulty of Paying Living Expenses: Not hard at all  Food Insecurity: No Food Insecurity  . Worried About Charity fundraiser in the Last Year: Never true  . Ran Out of Food in the Last Year: Never true  Transportation Needs: No Transportation Needs  . Lack of Transportation (Medical): No  .  Lack of Transportation (Non-Medical): No  Physical Activity: Inactive  . Days of Exercise per Week: 0 days  . Minutes of Exercise per Session: 0 min  Stress: No Stress Concern Present  . Feeling of Stress : Not at all  Social Connections: Moderately Integrated  . Frequency of Communication with Friends and Family: More than three times a week  . Frequency of Social Gatherings with Friends and Family: Three times a week  . Attends Religious Services: More than 4 times per year  . Active Member of Clubs or Organizations: Yes  . Attends Archivist Meetings: 1 to 4 times per year  . Marital Status: Divorced    Outpatient Medications Prior to Visit  Medication Sig Dispense Refill  . amLODipine (NORVASC) 5 MG tablet Take 1 tablet (5 mg total) by mouth daily. 90 tablet 3  . Calcium Citrate-Vitamin D (CITRACAL + D PO) Take 1 tablet by mouth daily with lunch. 500 mg calcium  (2 X 250 mg)    . Cholecalciferol (VITAMIN D3 PO) Take by mouth.    . Magnesium 500 MG TABS Take 500 mg by mouth daily with lunch.     . Menatetrenone (VITAMIN K2) 100 MCG TABS Take 100 mcg by mouth daily.    . metoprolol succinate (TOPROL-XL) 25 MG 24 hr tablet Take 0.5 tablets (12.5 mg total) by mouth at bedtime. 45 tablet 3  . Multiple Vitamin (MULTIVITAMIN WITH MINERALS) TABS tablet Take 1 tablet by mouth daily. One a Day - Women's    . vitamin C (ASCORBIC ACID) 500 MG tablet Take 500 mg by mouth at bedtime.     [name] PEG-KCl-NaCl-NaSulf-Na Asc-C (PLENVU) 140 g SOLR Take 1 kit by mouth as directed. (Patient not taking: Reported on 06/10/2020) 1 each 0  . cloNIDine (CATAPRES) tablet 0.1 mg      No facility-administered medications prior to visit.     EXAM:  BP (!) 145/70   Pulse (!) 54   Temp 98 F (36.7 C) (Oral)   Ht 5' 7" (1.702 m)   Wt 158 lb (71.7 kg)   SpO2 99%   BMI 24.75 kg/m   Body mass index is 24.75 kg/m.  GENERAL: vitals reviewed and listed above, alert, oriented, appears well hydrated and in no acute distress HEENT: atraumatic, conjunctiva  clear, no obvious abnormalities on inspection of external nose and ears OP :masked  NECK: no obvious masses on inspection palpation  LUNGS: clear to auscultation bilaterally, no wheezes, rales or rhonchi, good air movement CV: HRRR, no clubbing cyanosis or  peripheral edema nl cap refill  MS: moves all extremities without noticeable focal  abnormality PSYCH: pleasant and cooperative, no obvious depression or anxiety Lab Results  Component Value Date   WBC 2.8 (L) 06/10/2020   HGB 9.2 (L) 06/10/2020   HCT 29.0 (L) 06/10/2020   PLT 258.0 06/10/2020   GLUCOSE 86 12/06/2019   CHOL 215 (H) 12/06/2019   TRIG 138 12/06/2019   HDL 55 12/06/2019   LDLDIRECT 141.9 08/06/2012   LDLCALC 133 (H) 12/06/2019   ALT 13 12/06/2019   AST 21 12/06/2019   NA 139 12/06/2019   K 4.4 12/06/2019   CL 102 12/06/2019   CREATININE 0.88  12/06/2019   BUN 17 12/06/2019   CO2 31 12/06/2019   TSH 1.00 12/06/2019   INR 1.04 04/10/2009   HGBA1C 5.6 11/21/2018   BP Readings from Last 3 Encounters:  06/10/20 (!) 145/70  05/21/20 (!) 144/70  03/23/20 128/80  ASSESSMENT AND PLAN:  Discussed the following assessment and plan:  Iron deficiency anemia, unspecified iron deficiency anemia type - Plan: CBC with Differential/Platelet, VITAMIN D 25 Hydroxy (Vit-D Deficiency, Fractures), IBC + Ferritin, Zinc, Zinc, IBC + Ferritin, VITAMIN D 25 Hydroxy (Vit-D Deficiency, Fractures), CBC with Differential/Platelet, CANCELED: IBC + Ferritin, CANCELED: Zinc  Zinc deficiency on supp - Plan: CBC with Differential/Platelet, VITAMIN D 25 Hydroxy (Vit-D Deficiency, Fractures), IBC + Ferritin, Zinc, Zinc, IBC + Ferritin, VITAMIN D 25 Hydroxy (Vit-D Deficiency, Fractures), CBC with Differential/Platelet, CANCELED: IBC + Ferritin, CANCELED: Zinc  Vitamin D deficiency possible - Plan: CBC with Differential/Platelet, VITAMIN D 25 Hydroxy (Vit-D Deficiency, Fractures), IBC + Ferritin, Zinc, Zinc, IBC + Ferritin, VITAMIN D 25 Hydroxy (Vit-D Deficiency, Fractures), CBC with Differential/Platelet, CANCELED: IBC + Ferritin, CANCELED: Zinc  Age-related osteoporosis without current pathological fracture - consider consult  had se of catonel and  concer about se of other meds disc in past  Recurrent iron deficiency anemia presumed slow GI leak versus hemorrhoids was maintained on high-dose oral than the last time Feraheme or infusion. -Patient advised to return or notify health care team  if  new concerns arise.  Patient Instructions  Lab today  Will track down the dexa scan results   They are listed but cant find the  Scanned report  .   I know it showed osteoporosis but not sure is worse than pervious.  Consider  endocrine consult about  intervention options  Even if decide not to add medication.   Plan fu depending or 6 mos   Standley Brooking. Kieron Kantner M.D.

## 2020-06-10 ENCOUNTER — Other Ambulatory Visit: Payer: Self-pay

## 2020-06-10 ENCOUNTER — Ambulatory Visit (INDEPENDENT_AMBULATORY_CARE_PROVIDER_SITE_OTHER): Payer: Medicare Other | Admitting: Internal Medicine

## 2020-06-10 ENCOUNTER — Encounter: Payer: Self-pay | Admitting: Internal Medicine

## 2020-06-10 VITALS — BP 145/70 | HR 54 | Temp 98.0°F | Ht 67.0 in | Wt 158.0 lb

## 2020-06-10 DIAGNOSIS — M810 Age-related osteoporosis without current pathological fracture: Secondary | ICD-10-CM

## 2020-06-10 DIAGNOSIS — E60 Dietary zinc deficiency: Secondary | ICD-10-CM | POA: Diagnosis not present

## 2020-06-10 DIAGNOSIS — E559 Vitamin D deficiency, unspecified: Secondary | ICD-10-CM | POA: Diagnosis not present

## 2020-06-10 DIAGNOSIS — D509 Iron deficiency anemia, unspecified: Secondary | ICD-10-CM

## 2020-06-10 LAB — CBC WITH DIFFERENTIAL/PLATELET
Basophils Absolute: 0 10*3/uL (ref 0.0–0.1)
Basophils Relative: 1.7 % (ref 0.0–3.0)
Eosinophils Absolute: 0.2 10*3/uL (ref 0.0–0.7)
Eosinophils Relative: 5.6 % — ABNORMAL HIGH (ref 0.0–5.0)
HCT: 29 % — ABNORMAL LOW (ref 36.0–46.0)
Hemoglobin: 9.2 g/dL — ABNORMAL LOW (ref 12.0–15.0)
Lymphocytes Relative: 32.8 % (ref 12.0–46.0)
Lymphs Abs: 0.9 10*3/uL (ref 0.7–4.0)
MCHC: 31.7 g/dL (ref 30.0–36.0)
MCV: 82.6 fl (ref 78.0–100.0)
Monocytes Absolute: 0.3 10*3/uL (ref 0.1–1.0)
Monocytes Relative: 11.1 % (ref 3.0–12.0)
Neutro Abs: 1.4 10*3/uL (ref 1.4–7.7)
Neutrophils Relative %: 48.8 % (ref 43.0–77.0)
Platelets: 258 10*3/uL (ref 150.0–400.0)
RBC: 3.51 Mil/uL — ABNORMAL LOW (ref 3.87–5.11)
RDW: 16.3 % — ABNORMAL HIGH (ref 11.5–15.5)
WBC: 2.8 10*3/uL — ABNORMAL LOW (ref 4.0–10.5)

## 2020-06-10 LAB — IBC + FERRITIN
Ferritin: 9.6 ng/mL — ABNORMAL LOW (ref 10.0–291.0)
Iron: 27 ug/dL — ABNORMAL LOW (ref 42–145)
Saturation Ratios: 6.3 % — ABNORMAL LOW (ref 20.0–50.0)
Transferrin: 306 mg/dL (ref 212.0–360.0)

## 2020-06-10 LAB — VITAMIN D 25 HYDROXY (VIT D DEFICIENCY, FRACTURES): VITD: 83.58 ng/mL (ref 30.00–100.00)

## 2020-06-10 NOTE — Patient Instructions (Signed)
Lab today  Will track down the dexa scan results   They are listed but cant find the  Scanned report  .   I know it showed osteoporosis but not sure is worse than pervious.  Consider  endocrine consult about  intervention options  Even if decide not to add medication.   Plan fu depending or 6 mos

## 2020-06-11 ENCOUNTER — Telehealth: Payer: Self-pay

## 2020-06-11 NOTE — Telephone Encounter (Signed)
RN spoke with Clyde Canterbury at [facility], requested patient's dexa scan results be faxed to Dr. Regis Bill per her request at [phone].   Beryle Flock, RN

## 2020-06-12 LAB — ZINC: Zinc: 89 ug/dL (ref 60–130)

## 2020-06-14 ENCOUNTER — Encounter: Payer: Self-pay | Admitting: Hematology and Oncology

## 2020-06-14 NOTE — Progress Notes (Signed)
Anemia is back  I advise  you get back with hematology and another iron infusion ..possible  could try liquid iron  they use ofr  children but not sure that will work either  (tastes like  metal ) The  updated dexa scan  showed significant decrease in the hip area    from the last scan   .(Hope this gets scanned in this time )![name] Let me know if  you want to proceed with endocrinology consults about  osteoporosis treatment options  as we discussed at the visit .

## 2020-06-15 ENCOUNTER — Other Ambulatory Visit: Payer: Self-pay | Admitting: Hematology and Oncology

## 2020-06-15 ENCOUNTER — Telehealth: Payer: Self-pay | Admitting: Hematology and Oncology

## 2020-06-15 NOTE — Telephone Encounter (Signed)
Scheduled appt per 2/21 sch msg - pt is aware of apts added on the schedule.

## 2020-06-16 NOTE — Telephone Encounter (Signed)
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

## 2020-06-17 ENCOUNTER — Ambulatory Visit (INDEPENDENT_AMBULATORY_CARE_PROVIDER_SITE_OTHER): Payer: Medicare Other | Admitting: Obstetrics & Gynecology

## 2020-06-17 ENCOUNTER — Other Ambulatory Visit: Payer: Self-pay

## 2020-06-17 ENCOUNTER — Encounter: Payer: Self-pay | Admitting: Obstetrics & Gynecology

## 2020-06-17 VITALS — BP 140/80

## 2020-06-17 DIAGNOSIS — Z4689 Encounter for fitting and adjustment of other specified devices: Secondary | ICD-10-CM | POA: Diagnosis not present

## 2020-06-17 NOTE — Progress Notes (Signed)
    Belinda Harper November 11, 1940 [id number]        81 y.o.  [id number]   RP: Pessary maintenance  HPI:  Doing very well on Milex ring #5 with support. Very mild vaginal discharge x 3 weeks, no itching or odor, no bleeding, no vaginal pain.  Urine/BMs normal.   OB History  Gravida Para Term Preterm AB Living  3 2     1 2   SAB IAB Ectopic Multiple Live Births  1            # Outcome Date GA Lbr Len/2nd Weight Sex Delivery Anes PTL Lv  3 SAB           2 Para           1 Para             Past medical history,surgical history, problem list, medications, allergies, family history and social history were all reviewed and documented in the EPIC chart.   Directed ROS with pertinent positives and negatives documented in the history of present illness/assessment and plan.  Exam:  Vitals:   06/17/20 1002  BP: 140/80   General appearance:  Normal  Abdomen: Normal  Gynecologic exam: Vulva normal.  Pessary removed easily, no abnormal discharge.  Pessary cleaned.  Vaginal exam revealed intact vaginal mucosa.  Pessary inserted in vagina easily, good fit.   Assessment/Plan:  80 y.o. [id number]   1. Encounter for pessary maintenance Doing very well with Milex ring #5 with support.  No complication.  Will f/u in 4-5 months for pessary maintenance.  [name] MD, 10:31 AM 06/17/2020

## 2020-06-23 ENCOUNTER — Other Ambulatory Visit: Payer: Self-pay

## 2020-06-23 ENCOUNTER — Inpatient Hospital Stay: Payer: Medicare Other | Attending: Hematology and Oncology

## 2020-06-23 ENCOUNTER — Inpatient Hospital Stay (HOSPITAL_BASED_OUTPATIENT_CLINIC_OR_DEPARTMENT_OTHER): Payer: Medicare Other | Admitting: Hematology and Oncology

## 2020-06-23 DIAGNOSIS — D500 Iron deficiency anemia secondary to blood loss (chronic): Secondary | ICD-10-CM | POA: Diagnosis not present

## 2020-06-23 DIAGNOSIS — D72819 Decreased white blood cell count, unspecified: Secondary | ICD-10-CM | POA: Insufficient documentation

## 2020-06-23 DIAGNOSIS — D508 Other iron deficiency anemias: Secondary | ICD-10-CM | POA: Diagnosis not present

## 2020-06-23 DIAGNOSIS — K909 Intestinal malabsorption, unspecified: Secondary | ICD-10-CM | POA: Insufficient documentation

## 2020-06-23 MED ORDER — SODIUM CHLORIDE 0.9 % IV SOLN
200.0000 mg | Freq: Once | INTRAVENOUS | Status: AC
  Administered 2020-06-23: 200 mg via INTRAVENOUS
  Filled 2020-06-23: qty 200

## 2020-06-23 NOTE — Progress Notes (Signed)
Pt discharged in no apparent distress. Pt left ambulatory without assistance.  Pt aware of discharge instructions and verbalized understanding and had no further questions. She waited her full 30 minute post iron with no issues

## 2020-06-23 NOTE — Patient Instructions (Signed)

## 2020-06-24 ENCOUNTER — Encounter: Payer: Self-pay | Admitting: Hematology and Oncology

## 2020-06-24 NOTE — Assessment & Plan Note (Signed)
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

## 2020-06-24 NOTE — Progress Notes (Signed)
[facility] OFFICE PROGRESS NOTE  Panosh, Standley Brooking, MD  ASSESSMENT & PLAN:  Iron deficiency anemia due to chronic blood loss Despite numerous doses of IV iron, she is still anemic I recommend 3 more doses of IV iron and then recheck within the month The most likely cause of her anemia is due to chronic blood loss/malabsorption syndrome. We discussed some of the risks, benefits, and alternatives of intravenous iron infusions. The patient is symptomatic from anemia and the iron level is critically low. She tolerated oral iron supplement poorly and desires to achieved higher levels of iron faster for adequate hematopoesis. Some of the side-effects to be expected including risks of infusion reactions, phlebitis, headaches, nausea and fatigue.  The patient is willing to proceed. Patient education material was dispensed.  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   The total time spent in the appointment was 20 minutes encounter with patients including review of chart and various tests results, discussions about plan of care and coordination of care plan   All questions were answered. The patient knows to call the clinic with any problems, questions or concerns. No barriers to learning was detected.    [name], MD 3/2/202210:45 AM  INTERVAL HISTORY: Belinda Harper 80 y.o. female returns for follow-up on recurrent pancytopenia and severe iron deficiency anemia She is symptomatic She has occasional hemorrhoidal bleeding Denies recent chest pain or shortness of breath She felt weak Repeat blood work and iron studies from her primary care doctor show significant anemia with iron  deficiency and mild leukopenia She denies recent infection, fever or chills   SUMMARY OF HEMATOLOGIC HISTORY: Belinda Harper is seen because of recurrent pancytopenia I have not seen her since 2015 She was transferred to my care after her prior physician has left I reviewed the patient's records extensive and collaborated the history with the patient. Summary of her history is as follows: This is a pleasant woman with multifactorial anemia and associated leukopenia. 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 The chronic anemia and leukopenia go back as far as we have records (2007) when white counts ran as low as 2700. She has a normal differential. White count average is 3000 and has not changed. Platelet count is normal. Best hemoglobin she achieves with parenteral iron replacement is 11 g. She had a nondiagnostic bone marrow biopsy done in 2006. She had received intravenous iron infusion in the past. Her last colonoscopy in May 2013 showed severe diverticulosis with internal hemorrhoids. She feels well. Denies recent infection. She denies symptoms of fatigue. She tolerated iron supplement well. She had periodic hemorrhoidal bleeding. She was found to have abnormal CBC from intermittently over the years by her primary care doctor She has started taking oral iron supplements since November of last year Most recently, she have noted passage of dark stool She attempted to increase her oral iron supplement but still complain of excessive fatigue Recently, her repeat CBC has dropped from 11-8.2 and hence she was referred back here for further evaluation  She denies recent chest pain on exertion, shortness of breath on minimal  exertion, pre-syncopal episodes, or palpitations.  The patient denies over the counter NSAID ingestion. She is not on antiplatelets agents. Her last colonoscopy was in 2013 She had no prior history or  diagnosis of cancer. Her age appropriate screening programs are up-to-date. She denies any pica and eats a variety of diet.  The patient was prescribed oral iron supplements and she takes 1 dose of oral iron supplement at night to avoid constipation In September 2021, she received 2 doses of intravenous iron infusion  I have reviewed the past medical history, past surgical history, social history and family history with the patient and they are unchanged from previous note.  ALLERGIES:  is allergic to sulfamethoxazole, nsaids, and sulfa antibiotics.  MEDICATIONS:  Current Outpatient Medications  Medication Sig Dispense Refill  . amLODipine (NORVASC) 5 MG tablet Take 1 tablet (5 mg total) by mouth daily. 90 tablet 3  . Calcium Citrate-Vitamin D (CITRACAL + D PO) Take 1 tablet by mouth daily with lunch. 500 mg calcium (2 X 250 mg)    . Cholecalciferol (VITAMIN D3 PO) Take by mouth.    . Magnesium 500 MG TABS Take 500 mg by mouth daily with lunch.     . Menatetrenone (VITAMIN K2) 100 MCG TABS Take 100 mcg by mouth daily.    . metoprolol succinate (TOPROL-XL) 25 MG 24 hr tablet Take 0.5 tablets (12.5 mg total) by mouth at bedtime. 45 tablet 3  . Multiple Vitamin (MULTIVITAMIN WITH MINERALS) TABS tablet Take 1 tablet by mouth daily. One a Day - Women's    . vitamin C (ASCORBIC ACID) 500 MG tablet Take 500 mg by mouth at bedtime.      No current facility-administered medications for this visit.     REVIEW OF SYSTEMS:   Constitutional: Denies fevers, chills or night sweats Eyes: Denies blurriness of vision Ears, nose, mouth, throat, and face: Denies mucositis or sore throat Respiratory: Denies cough, dyspnea or wheezes Cardiovascular: Denies palpitation, chest discomfort or lower extremity swelling Gastrointestinal:  Denies nausea, heartburn or change in bowel habits Skin: Denies abnormal skin rashes Lymphatics: Denies new lymphadenopathy or easy bruising Neurological:Denies numbness,  tingling or new weaknesses Behavioral/Psych: Mood is stable, no new changes  All other systems were reviewed with the patient and are negative.  PHYSICAL EXAMINATION: ECOG PERFORMANCE STATUS: 1 - Symptomatic but completely ambulatory  Vitals:   06/23/20 1404  BP: (!) 129/42  Pulse: 77  Resp: 18  Temp: 97.7 F (36.5 C)  SpO2: 97%   Filed Weights   06/23/20 1404  Weight: 161 lb (73 kg)    GENERAL:alert, no distress and comfortable.  She looks pale  Musculoskeletal:no cyanosis of digits and no clubbing  NEURO: alert & oriented x 3 with fluent speech, no focal motor/sensory deficits  LABORATORY DATA:  I have reviewed the data as listed     Component Value Date/Time   NA 139 12/06/2019 1147   NA 142 05/28/2012 1600   K 4.4 12/06/2019 1147   K 3.7 05/28/2012 1600   CL 102 12/06/2019 1147   CL 103 05/28/2012 1600   CO2 31 12/06/2019 1147   CO2 29 05/28/2012 1600   GLUCOSE 86 12/06/2019 1147   GLUCOSE 99 05/28/2012 1600   BUN 17 12/06/2019 1147   BUN 19.3 05/28/2012 1600   CREATININE 0.88 12/06/2019 1147   CREATININE 0.8 05/28/2012 1600   CALCIUM 9.9 12/06/2019 1147   CALCIUM 9.5 05/28/2012 1600   PROT 7.1 12/06/2019 1147   PROT 7.3  05/28/2012 1600   ALBUMIN 4.4 11/21/2018 1215   ALBUMIN 3.5 05/28/2012 1600   AST 21 12/06/2019 1147   AST 23 05/28/2012 1600   ALT 13 12/06/2019 1147   ALT 13 05/28/2012 1600   ALKPHOS 86 11/21/2018 1215   ALKPHOS 82 05/28/2012 1600   BILITOT 0.4 12/06/2019 1147   BILITOT <0.20 05/28/2012 1600   GFRNONAA >60 04/23/2018 1634   GFRAA >60 04/23/2018 1634    No results found for: SPEP, UPEP  Lab Results  Component Value Date   WBC 2.8 (L) 06/10/2020   NEUTROABS 1.4 06/10/2020   HGB 9.2 (L) 06/10/2020   HCT 29.0 (L) 06/10/2020   MCV 82.6 06/10/2020   PLT 258.0 06/10/2020      Chemistry      Component Value Date/Time   NA 139 12/06/2019 1147   NA 142 05/28/2012 1600   K 4.4 12/06/2019 1147   K 3.7 05/28/2012 1600   CL  102 12/06/2019 1147   CL 103 05/28/2012 1600   CO2 31 12/06/2019 1147   CO2 29 05/28/2012 1600   BUN 17 12/06/2019 1147   BUN 19.3 05/28/2012 1600   CREATININE 0.88 12/06/2019 1147   CREATININE 0.8 05/28/2012 1600      Component Value Date/Time   CALCIUM 9.9 12/06/2019 1147   CALCIUM 9.5 05/28/2012 1600   ALKPHOS 86 11/21/2018 1215   ALKPHOS 82 05/28/2012 1600   AST 21 12/06/2019 1147   AST 23 05/28/2012 1600   ALT 13 12/06/2019 1147   ALT 13 05/28/2012 1600   BILITOT 0.4 12/06/2019 1147   BILITOT <0.20 05/28/2012 1600

## 2020-06-24 NOTE — Assessment & Plan Note (Signed)
Despite numerous doses of IV iron, she is still anemic I recommend 3 more doses of IV iron and then recheck within the month The most likely cause of her anemia is due to chronic blood loss/malabsorption syndrome. We discussed some of the risks, benefits, and alternatives of intravenous iron infusions. The patient is symptomatic from anemia and the iron level is critically low. She tolerated oral iron supplement poorly and desires to achieved higher levels of iron faster for adequate hematopoesis. Some of the side-effects to be expected including risks of infusion reactions, phlebitis, headaches, nausea and fatigue.  The patient is willing to proceed. Patient education material was dispensed.  Goal is to keep ferritin level greater than 50 and resolution of anemia

## 2020-06-30 ENCOUNTER — Other Ambulatory Visit: Payer: Self-pay

## 2020-06-30 ENCOUNTER — Inpatient Hospital Stay: Payer: Medicare Other

## 2020-06-30 VITALS — BP 125/51 | HR 61 | Temp 98.1°F | Resp 18

## 2020-06-30 DIAGNOSIS — D500 Iron deficiency anemia secondary to blood loss (chronic): Secondary | ICD-10-CM

## 2020-06-30 DIAGNOSIS — D508 Other iron deficiency anemias: Secondary | ICD-10-CM | POA: Diagnosis not present

## 2020-06-30 MED ORDER — SODIUM CHLORIDE 0.9 % IV SOLN
200.0000 mg | Freq: Once | INTRAVENOUS | Status: AC
  Administered 2020-06-30: 200 mg via INTRAVENOUS
  Filled 2020-06-30: qty 200

## 2020-06-30 MED ORDER — SODIUM CHLORIDE 0.9 % IV SOLN
INTRAVENOUS | Status: DC
  Filled 2020-06-30: qty 250

## 2020-06-30 NOTE — Progress Notes (Signed)
Patient declined remainder of 30 minute post-observation. Vital signs stable upon discharge.

## 2020-06-30 NOTE — Patient Instructions (Signed)

## 2020-07-07 ENCOUNTER — Other Ambulatory Visit: Payer: Self-pay

## 2020-07-07 ENCOUNTER — Inpatient Hospital Stay: Payer: Medicare Other

## 2020-07-07 VITALS — BP 130/54 | HR 61 | Temp 97.9°F | Resp 17

## 2020-07-07 DIAGNOSIS — D508 Other iron deficiency anemias: Secondary | ICD-10-CM | POA: Diagnosis not present

## 2020-07-07 DIAGNOSIS — D500 Iron deficiency anemia secondary to blood loss (chronic): Secondary | ICD-10-CM

## 2020-07-07 MED ORDER — SODIUM CHLORIDE 0.9 % IV SOLN
INTRAVENOUS | Status: DC
  Filled 2020-07-07: qty 250

## 2020-07-07 MED ORDER — SODIUM CHLORIDE 0.9 % IV SOLN
200.0000 mg | Freq: Once | INTRAVENOUS | Status: AC
  Administered 2020-07-07: 200 mg via INTRAVENOUS
  Filled 2020-07-07: qty 200

## 2020-07-07 NOTE — Progress Notes (Signed)
Patient declined post observation after iron infusion. Verbal education provided.

## 2020-07-07 NOTE — Patient Instructions (Signed)

## 2020-07-29 ENCOUNTER — Encounter: Payer: Self-pay | Admitting: Hematology and Oncology

## 2020-07-29 ENCOUNTER — Inpatient Hospital Stay: Payer: Medicare Other

## 2020-07-29 ENCOUNTER — Other Ambulatory Visit: Payer: Self-pay

## 2020-07-29 ENCOUNTER — Inpatient Hospital Stay: Payer: Medicare Other | Attending: Hematology and Oncology

## 2020-07-29 ENCOUNTER — Inpatient Hospital Stay (HOSPITAL_BASED_OUTPATIENT_CLINIC_OR_DEPARTMENT_OTHER): Payer: Medicare Other | Admitting: Hematology and Oncology

## 2020-07-29 VITALS — BP 132/53 | HR 54 | Temp 98.3°F | Resp 17

## 2020-07-29 VITALS — BP 143/45 | HR 61 | Temp 97.5°F | Resp 18 | Ht 67.0 in | Wt 159.7 lb

## 2020-07-29 DIAGNOSIS — K909 Intestinal malabsorption, unspecified: Secondary | ICD-10-CM | POA: Insufficient documentation

## 2020-07-29 DIAGNOSIS — D72819 Decreased white blood cell count, unspecified: Secondary | ICD-10-CM

## 2020-07-29 DIAGNOSIS — D508 Other iron deficiency anemias: Secondary | ICD-10-CM | POA: Diagnosis not present

## 2020-07-29 DIAGNOSIS — D500 Iron deficiency anemia secondary to blood loss (chronic): Secondary | ICD-10-CM | POA: Diagnosis not present

## 2020-07-29 DIAGNOSIS — D539 Nutritional anemia, unspecified: Secondary | ICD-10-CM

## 2020-07-29 LAB — CBC WITH DIFFERENTIAL/PLATELET
Abs Immature Granulocytes: 0.01 10*3/uL (ref 0.00–0.07)
Basophils Absolute: 0.1 10*3/uL (ref 0.0–0.1)
Basophils Relative: 2 %
Eosinophils Absolute: 0.2 10*3/uL (ref 0.0–0.5)
Eosinophils Relative: 6 %
HCT: 31.4 % — ABNORMAL LOW (ref 36.0–46.0)
Hemoglobin: 9.4 g/dL — ABNORMAL LOW (ref 12.0–15.0)
Immature Granulocytes: 0 %
Lymphocytes Relative: 37 %
Lymphs Abs: 1.3 10*3/uL (ref 0.7–4.0)
MCH: 25.1 pg — ABNORMAL LOW (ref 26.0–34.0)
MCHC: 29.9 g/dL — ABNORMAL LOW (ref 30.0–36.0)
MCV: 84 fL (ref 80.0–100.0)
Monocytes Absolute: 0.3 10*3/uL (ref 0.1–1.0)
Monocytes Relative: 8 %
Neutro Abs: 1.6 10*3/uL — ABNORMAL LOW (ref 1.7–7.7)
Neutrophils Relative %: 47 %
Platelets: 226 10*3/uL (ref 150–400)
RBC: 3.74 MIL/uL — ABNORMAL LOW (ref 3.87–5.11)
RDW: 19.9 % — ABNORMAL HIGH (ref 11.5–15.5)
WBC: 3.4 10*3/uL — ABNORMAL LOW (ref 4.0–10.5)
nRBC: 0 % (ref 0.0–0.2)

## 2020-07-29 LAB — IRON AND TIBC
Iron: 47 ug/dL (ref 41–142)
Saturation Ratios: 14 % — ABNORMAL LOW (ref 21–57)
TIBC: 341 ug/dL (ref 236–444)
UIBC: 295 ug/dL (ref 120–384)

## 2020-07-29 LAB — FERRITIN: Ferritin: 69 ng/mL (ref 11–307)

## 2020-07-29 MED ORDER — SODIUM CHLORIDE 0.9 % IV SOLN
INTRAVENOUS | Status: DC
  Filled 2020-07-29: qty 250

## 2020-07-29 MED ORDER — SODIUM CHLORIDE 0.9 % IV SOLN
200.0000 mg | Freq: Once | INTRAVENOUS | Status: AC
  Administered 2020-07-29: 200 mg via INTRAVENOUS
  Filled 2020-07-29: qty 200

## 2020-07-29 NOTE — Assessment & Plan Note (Signed)
She has persistent iron deficiency despite several doses of intravenous iron sucrose I suspect this is because the doses of IV iron is lower than prior dosing with iron Feraheme I recommend 3 more doses of IV iron sucrose in addition to today's dose The goal would be normalization of her iron studies and ferritin greater than 100 and resolution of anemia I plan to see her again in 3 months for further follow-up  I am also wondering whether there could be a component of anemia chronic illness related to chronic kidney disease I will check EPO level in her next visit

## 2020-07-29 NOTE — Progress Notes (Signed)
[facility] OFFICE PROGRESS NOTE  Panosh, Standley Brooking, MD  ASSESSMENT & PLAN:  Iron deficiency anemia due to chronic blood loss She has persistent iron deficiency despite several doses of intravenous iron sucrose I suspect this is because the doses of IV iron is lower than prior dosing with iron Feraheme I recommend 3 more doses of IV iron sucrose in addition to today's dose The goal would be normalization of her iron studies and ferritin greater than 100 and resolution of anemia I plan to see her again in 3 months for further follow-up  I am also wondering whether there could be a component of anemia chronic illness related to chronic kidney disease I will check EPO level in her next visit  Leukopenia 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 Reticulocytes    Standing Status:   Future    Standing Expiration Date:   07/29/2021  . Erythropoietin    Standing Status:   Future    Standing Expiration Date:   07/29/2021  . Basic Metabolic Panel - [place] Only    Standing Status:   Future    Standing Expiration Date:   07/29/2021  . Iron and TIBC    Standing Status:   Future    Standing Expiration Date:   07/29/2021  . Ferritin    Standing Status:   Future    Standing Expiration Date:   07/29/2021  . Vitamin B12    Standing Status:   Future    Standing Expiration Date:   07/29/2021    The total time spent in the appointment was 20 minutes encounter with patients including review of chart and various tests results, discussions about plan of care and coordination of care plan   All questions were answered. The patient knows to call the clinic with any problems, questions or concerns. No barriers to learning was  detected.    [name], MD 4/6/20221:45 PM  INTERVAL HISTORY: Belinda Harper 80 y.o. female returns for severe recurrent iron deficiency anemia She denies recent signs of bleeding Her appetite is fair She complained of excessive fatigue No recent infection, fever or chills  SUMMARY OF HEMATOLOGIC HISTORY: Belinda Harper is seen because of recurrent pancytopenia I have not seen her since 2015 She was transferred to my care after her prior physician has left I reviewed the patient's records extensive and collaborated the history with the patient. Summary of her history is as follows: This is a pleasant woman with multifactorial anemia and associated leukopenia. 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 The chronic anemia and leukopenia go back as far as we have records (2007) when white counts ran as low as 2700. She has a normal differential. White count average is 3000 and has not changed. Platelet count is normal. Best hemoglobin she achieves with parenteral iron replacement is 11 g. She had a nondiagnostic bone marrow biopsy done in 2006. She had received intravenous iron infusion in the past. Her last colonoscopy in May 2013 showed severe diverticulosis with internal hemorrhoids. She feels well. Denies recent infection. She denies symptoms of fatigue. She tolerated iron supplement well. She had periodic  hemorrhoidal bleeding. She was found to have abnormal CBC from intermittently over the years by her primary care doctor She has started taking oral iron supplements since November of last year Most recently, she have noted passage of dark stool She attempted to increase her oral iron supplement but still complain of excessive fatigue Recently, her repeat CBC has dropped from 11-8.2 and hence she was referred back here for further evaluation  She denies recent chest pain on exertion, shortness of breath on minimal  exertion, pre-syncopal episodes, or palpitations.  The patient denies over the counter NSAID ingestion. She is not on antiplatelets agents. Her last colonoscopy was in 2013 She had no prior history or diagnosis of cancer. Her age appropriate screening programs are up-to-date. She denies any pica and eats a variety of diet.  The patient was prescribed oral iron supplements and she takes 1 dose of oral iron supplement at night to avoid constipation From Sept 2021 till present, she has received many doses of intravenous iron infusion  I have reviewed the past medical history, past surgical history, social history and family history with the patient and they are unchanged from previous note.  ALLERGIES:  is allergic to sulfamethoxazole, nsaids, and sulfa antibiotics.  MEDICATIONS:  Current Outpatient Medications  Medication Sig Dispense Refill  . amLODipine (NORVASC) 5 MG tablet Take 1 tablet (5 mg total) by mouth daily. 90 tablet 3  . Calcium Citrate-Vitamin D (CITRACAL + D PO) Take 1 tablet by mouth daily with lunch. 500 mg calcium (2 X 250 mg)    . Cholecalciferol (VITAMIN D3 PO) Take by mouth.    . Magnesium 500 MG TABS Take 500 mg by mouth daily with lunch.     . Menatetrenone (VITAMIN K2) 100 MCG TABS Take 100 mcg by mouth daily.    . metoprolol succinate (TOPROL-XL) 25 MG 24 hr tablet Take 0.5 tablets (12.5 mg total) by mouth at bedtime. 45 tablet 3  . Multiple Vitamin (MULTIVITAMIN WITH MINERALS) TABS tablet Take 1 tablet by mouth daily. One a Day - Women's    . vitamin C (ASCORBIC ACID) 500 MG tablet Take 500 mg by mouth at bedtime.      No current facility-administered medications for this visit.   Facility-Administered Medications Ordered in Other Visits  Medication Dose Route Frequency Provider Last Rate Last Admin  . 0.9 %  sodium chloride infusion   Intravenous Continuous [name], MD 20 mL/hr at 07/29/20 1332 New Bag at 07/29/20 1332  . iron sucrose (VENOFER) 200 mg in  sodium chloride 0.9 % 100 mL IVPB  200 mg Intravenous Once Alvy Bimler, Malosi Hemstreet, MD         REVIEW OF SYSTEMS:   Constitutional: Denies fevers, chills or night sweats Eyes: Denies blurriness of vision Ears, nose, mouth, throat, and face: Denies mucositis or sore throat Respiratory: Denies cough, dyspnea or wheezes Cardiovascular: Denies palpitation, chest discomfort or lower extremity swelling Gastrointestinal:  Denies nausea, heartburn or change in bowel habits Skin: Denies abnormal skin rashes Lymphatics: Denies new lymphadenopathy or easy bruising Neurological:Denies numbness, tingling or new weaknesses Behavioral/Psych: Mood is stable, no new changes  All other systems were reviewed with the patient and are negative.  PHYSICAL EXAMINATION: ECOG PERFORMANCE STATUS: 1 - Symptomatic but completely ambulatory  Vitals:   07/29/20 1236  BP: (!) 143/45  Pulse: 61  Resp: 18  Temp: (!) 97.5 F (36.4 C)  SpO2: 99%   Filed Weights   07/29/20 1236  Weight: 159 lb 11.2  oz (72.4 kg)    GENERAL:alert, no distress and comfortable.  She looks pale  NEURO: alert & oriented x 3 with fluent speech, no focal motor/sensory deficits  LABORATORY DATA:  I have reviewed the data as listed     Component Value Date/Time   NA 139 12/06/2019 1147   NA 142 05/28/2012 1600   K 4.4 12/06/2019 1147   K 3.7 05/28/2012 1600   CL 102 12/06/2019 1147   CL 103 05/28/2012 1600   CO2 31 12/06/2019 1147   CO2 29 05/28/2012 1600   GLUCOSE 86 12/06/2019 1147   GLUCOSE 99 05/28/2012 1600   BUN 17 12/06/2019 1147   BUN 19.3 05/28/2012 1600   CREATININE 0.88 12/06/2019 1147   CREATININE 0.8 05/28/2012 1600   CALCIUM 9.9 12/06/2019 1147   CALCIUM 9.5 05/28/2012 1600   PROT 7.1 12/06/2019 1147   PROT 7.3 05/28/2012 1600   ALBUMIN 4.4 11/21/2018 1215   ALBUMIN 3.5 05/28/2012 1600   AST 21 12/06/2019 1147   AST 23 05/28/2012 1600   ALT 13 12/06/2019 1147   ALT 13 05/28/2012 1600   ALKPHOS 86 11/21/2018  1215   ALKPHOS 82 05/28/2012 1600   BILITOT 0.4 12/06/2019 1147   BILITOT <0.20 05/28/2012 1600   GFRNONAA >60 04/23/2018 1634   GFRAA >60 04/23/2018 1634    No results found for: SPEP, UPEP  Lab Results  Component Value Date   WBC 3.4 (L) 07/29/2020   NEUTROABS 1.6 (L) 07/29/2020   HGB 9.4 (L) 07/29/2020   HCT 31.4 (L) 07/29/2020   MCV 84.0 07/29/2020   PLT 226 07/29/2020      Chemistry      Component Value Date/Time   NA 139 12/06/2019 1147   NA 142 05/28/2012 1600   K 4.4 12/06/2019 1147   K 3.7 05/28/2012 1600   CL 102 12/06/2019 1147   CL 103 05/28/2012 1600   CO2 31 12/06/2019 1147   CO2 29 05/28/2012 1600   BUN 17 12/06/2019 1147   BUN 19.3 05/28/2012 1600   CREATININE 0.88 12/06/2019 1147   CREATININE 0.8 05/28/2012 1600      Component Value Date/Time   CALCIUM 9.9 12/06/2019 1147   CALCIUM 9.5 05/28/2012 1600   ALKPHOS 86 11/21/2018 1215   ALKPHOS 82 05/28/2012 1600   AST 21 12/06/2019 1147   AST 23 05/28/2012 1600   ALT 13 12/06/2019 1147   ALT 13 05/28/2012 1600   BILITOT 0.4 12/06/2019 1147   BILITOT <0.20 05/28/2012 1600

## 2020-07-29 NOTE — Assessment & Plan Note (Signed)
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

## 2020-08-11 ENCOUNTER — Inpatient Hospital Stay: Payer: Medicare Other

## 2020-08-11 ENCOUNTER — Other Ambulatory Visit: Payer: Self-pay

## 2020-08-11 VITALS — BP 118/47 | HR 61 | Temp 98.1°F | Resp 18

## 2020-08-11 DIAGNOSIS — D508 Other iron deficiency anemias: Secondary | ICD-10-CM | POA: Diagnosis not present

## 2020-08-11 DIAGNOSIS — D500 Iron deficiency anemia secondary to blood loss (chronic): Secondary | ICD-10-CM

## 2020-08-11 MED ORDER — SODIUM CHLORIDE 0.9 % IV SOLN
200.0000 mg | Freq: Once | INTRAVENOUS | Status: AC
  Administered 2020-08-11: 200 mg via INTRAVENOUS
  Filled 2020-08-11: qty 200

## 2020-08-11 MED ORDER — SODIUM CHLORIDE 0.9 % IV SOLN
INTRAVENOUS | Status: DC
  Filled 2020-08-11: qty 250

## 2020-08-11 NOTE — Patient Instructions (Signed)

## 2020-08-18 ENCOUNTER — Other Ambulatory Visit: Payer: Self-pay

## 2020-08-18 ENCOUNTER — Inpatient Hospital Stay: Payer: Medicare Other

## 2020-08-18 VITALS — BP 113/60 | HR 58 | Temp 98.1°F | Resp 18

## 2020-08-18 DIAGNOSIS — D508 Other iron deficiency anemias: Secondary | ICD-10-CM | POA: Diagnosis not present

## 2020-08-18 DIAGNOSIS — D500 Iron deficiency anemia secondary to blood loss (chronic): Secondary | ICD-10-CM

## 2020-08-18 MED ORDER — SODIUM CHLORIDE 0.9 % IV SOLN
Freq: Once | INTRAVENOUS | Status: AC
  Filled 2020-08-18: qty 250

## 2020-08-18 MED ORDER — SODIUM CHLORIDE 0.9 % IV SOLN
200.0000 mg | Freq: Once | INTRAVENOUS | Status: AC
  Administered 2020-08-18: 200 mg via INTRAVENOUS
  Filled 2020-08-18: qty 200

## 2020-08-18 NOTE — Progress Notes (Signed)
Patient agreed to stay for 15 minutes post infusion. Upon discharge, no complaints and vitals WNL.

## 2020-08-25 ENCOUNTER — Inpatient Hospital Stay: Payer: Medicare Other | Attending: Hematology and Oncology

## 2020-08-25 ENCOUNTER — Other Ambulatory Visit: Payer: Self-pay

## 2020-08-25 VITALS — BP 119/65 | HR 72 | Temp 98.7°F | Resp 18

## 2020-08-25 DIAGNOSIS — D508 Other iron deficiency anemias: Secondary | ICD-10-CM | POA: Diagnosis not present

## 2020-08-25 DIAGNOSIS — D500 Iron deficiency anemia secondary to blood loss (chronic): Secondary | ICD-10-CM

## 2020-08-25 MED ORDER — SODIUM CHLORIDE 0.9 % IV SOLN
200.0000 mg | Freq: Once | INTRAVENOUS | Status: AC
  Administered 2020-08-25: 200 mg via INTRAVENOUS
  Filled 2020-08-25: qty 200

## 2020-09-23 ENCOUNTER — Telehealth: Payer: Self-pay | Admitting: Hematology and Oncology

## 2020-09-23 NOTE — Telephone Encounter (Signed)
R/s appts per 5/26 sch msg. Pt aware.  

## 2020-10-13 ENCOUNTER — Encounter: Payer: Self-pay | Admitting: Internal Medicine

## 2020-10-13 ENCOUNTER — Ambulatory Visit (INDEPENDENT_AMBULATORY_CARE_PROVIDER_SITE_OTHER): Payer: Medicare Other | Admitting: Internal Medicine

## 2020-10-13 ENCOUNTER — Other Ambulatory Visit: Payer: Self-pay

## 2020-10-13 VITALS — BP 150/72 | HR 53 | Ht 67.0 in | Wt 156.8 lb

## 2020-10-13 DIAGNOSIS — D539 Nutritional anemia, unspecified: Secondary | ICD-10-CM

## 2020-10-13 DIAGNOSIS — I471 Supraventricular tachycardia: Secondary | ICD-10-CM | POA: Diagnosis not present

## 2020-10-13 DIAGNOSIS — I10 Essential (primary) hypertension: Secondary | ICD-10-CM

## 2020-10-13 LAB — CBC
Hematocrit: 30.5 % — ABNORMAL LOW (ref 34.0–46.6)
Hemoglobin: 9.4 g/dL — ABNORMAL LOW (ref 11.1–15.9)
MCH: 25.8 pg — ABNORMAL LOW (ref 26.6–33.0)
MCHC: 30.8 g/dL — ABNORMAL LOW (ref 31.5–35.7)
MCV: 84 fL (ref 79–97)
Platelets: 267 10*3/uL (ref 150–450)
RBC: 3.64 x10E6/uL — ABNORMAL LOW (ref 3.77–5.28)
RDW: 16 % — ABNORMAL HIGH (ref 11.7–15.4)
WBC: 4.1 10*3/uL (ref 3.4–10.8)

## 2020-10-13 MED ORDER — METOPROLOL SUCCINATE ER 25 MG PO TB24: 12.5000 mg | ORAL_TABLET | Freq: Every day | ORAL | 3 refills | Status: DC

## 2020-10-13 NOTE — Progress Notes (Signed)
OFFICE CONSULT NOTE  Chief Complaint:  Follow-up palpitations  Primary Care Physician: Burnis Medin, MD  HPI:  Belinda Harper is a 80 y.o. female who is being seen today for the evaluation of potation at the request of Panosh, Standley Brooking, MD.  This is a very pleasant 80 year old female kindly referred for evaluation of palpitations.  Her son is also a patient of mine.  She had been seen remotely by Dr. Stanford Breed in 2014 for dyspnea.  She underwent a Myoview stress test which showed normal perfusion and EF was 68%.  There is a history of coronary disease in her family including her father who had 5 vessel CABG in mother who had coronary disease.  She has recently been having episodes of what sound like palpitations or awareness of her heart.  These episodes are infrequent but have more recently happened a few times a week.  They are associated with a feeling of quivering in her chest.  She does not describe any significant chest pain associated with this.  In addition she purchased a pulse oximeter and noted that her heart rate at times would be very slow in the 30s.  She thought that it might be defective and got another 1 which also occasionally showed episodes of intermittent bradycardia.  Heart rate today is normal.  EKG shows normal sinus rhythm at 72.  10/13/2020  Belinda Harper returns today for follow-up of her palpitations.  She wore a 2-day ZIO monitor back in December.  This demonstrated multiple episodes of SVT.  Heart rate did dip down to the 40s at night.  I recommended a low-dose of Toprol-XL 12.5 mg daily.  She says since taking that medicine they have completely gone away.  Overall she feels very well on it.  In general her daytime heart rate is in the 50s but seems to be well-tolerated.  Blood pressure was a little higher than normal today in the office at 150/72.  She reports she has had some fatigue and has been battling anemia.  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   ALLERGIC RHINITIS 01/03/2007   ANEMIA-IRON DEFICIENCY 01/03/2007   Anxiety    ASTHMA 01/03/2007   Blood transfusion 12 2010   hg 4 transfused 4 units   DEPRESSION 01/03/2007   resolved   Family history of adverse reaction to anesthesia    pt's mother as she gets older organs shut down    FRACTURE, ANKLE, LEFT 04/10/2009   Gastritis 2002   GERD (gastroesophageal reflux disease)    HEMORRHOIDS 05/19/2008   HYPERCHOLESTEROLEMIA 05/19/2008   Normal cardiac stress test    2014   OSTEOARTHRITIS 01/03/2007   OSTEOPOROSIS 01/03/2007   hx of actonbel use for at least 5 years    Sepsis due to cellulitis ([place]) 02/05/2015   Unspecified vitamin D deficiency 04/06/2007   UTERINE PROLAPSE 04/06/2007    Past Surgical History:  Procedure Laterality Date   ANKLE FRACTURE SURGERY  02/2009   left ankle fracture and dislocation   CATARACT EXTRACTION Left    last year    CATARACT EXTRACTION W/ INTRAOCULAR LENS IMPLANT  2000   right eye   HARDWARE REMOVAL Left 02/21/2015   Procedure: IRRIGATION AND DEBRIDEMENT HARDWARE REMOVAL LEFT ANKLE ;  Surgeon: Gaynelle Arabian, MD;  Location: [facility];  Service: Orthopedics;  Laterality: Left;  MASS EXCISION Left 09/11/2012   Procedure: 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 She has never used smokeless tobacco. She reports current alcohol use. She reports that she does not use drugs.  ALLERGIES:  Allergies  Allergen Reactions   Sulfamethoxazole Rash   Nsaids Other (See Comments)    Drops hemoglobin levels   Sulfa Antibiotics Rash    ROS: Pertinent items noted in HPI and remainder of comprehensive ROS otherwise negative.  HOME MEDS: Current Outpatient Medications on File Prior to Visit  Medication Sig Dispense Refill   amLODipine (NORVASC) 5 MG tablet Take 1 tablet (5 mg total) by mouth daily. 90 tablet 3   Calcium Citrate-Vitamin D (CITRACAL + D PO) Take 1 tablet by mouth daily with lunch. 500 mg calcium (2 X 250 mg)     Chelated Zinc 50 MG TABS Take 50 mg by mouth daily.     Cholecalciferol (VITAMIN D3 PO) Take by mouth.     Magnesium 500 MG TABS Take 500 mg by mouth daily with lunch.      Menatetrenone (VITAMIN K2) 100 MCG TABS Take 100 mcg by mouth daily.     metoprolol succinate (TOPROL-XL) 25 MG 24 hr tablet Take 0.5 tablets (12.5 mg total) by mouth at bedtime. 45 tablet 3   Multiple Vitamin (MULTIVITAMIN WITH MINERALS) TABS tablet Take 1 tablet by mouth daily. One a Day - Women's     Oral Electrolytes (EMERGEN-C ELECTRO MIX PO) Take 1,000 mg by mouth daily. 1 packet daily     No current facility-administered medications on file prior to visit.    LABS/IMAGING: No results found for this or any previous visit (from the past 48 hour(s)). No results found.  LIPID PANEL:    Component Value Date/Time   CHOL 215 (H) 12/06/2019 1147   TRIG 138 12/06/2019 1147   TRIG 64 03/24/2006 0820   HDL 55 12/06/2019 1147   CHOLHDL 3.9 12/06/2019 1147   VLDL 16.6 11/21/2018 1215   LDLCALC 133 (H) 12/06/2019 1147   LDLDIRECT 141.9 08/06/2012 1041    WEIGHTS: Wt Readings from Last 3 Encounters:  10/13/20 156 lb 12.8 oz (71.1 kg)  07/29/20 159 lb 11.2 oz (72.4 kg)  06/23/20 161 lb (73 kg)    VITALS: BP (!) 150/72 (BP Location:  Left Arm, Patient Position: Sitting, Cuff Size: Normal)   Pulse (!) 53   Ht 5\' 7"  (1.702 m)   Wt 156 lb 12.8 oz (71.1 kg)   SpO2 97%   BMI 24.56 kg/m   EXAM: General appearance: alert and no distress Neck: no carotid bruit, no JVD and thyroid not enlarged, symmetric, no tenderness/mass/nodules Lungs: clear to auscultation bilaterally Heart: regular rate and rhythm, S1,  S2 normal and systolic murmur: early systolic 2/6, crescendo at 2nd right intercostal space Abdomen: soft, non-tender; bowel sounds normal; no masses,  no organomegaly Extremities: extremities normal, atraumatic, no cyanosis or edema Pulses: 2+ and symmetric Skin: Skin color, texture, turgor normal. No rashes or lesions Neurologic: Grossly normal Psych: Pleasant  EKG: Sinus bradycardia first-degree AV block at 53- personally reviewed  ASSESSMENT: Palpitations with intermittent bradycardia Hypertension Family history of coronary disease  PLAN: 1.   Belinda Harper does note improvement in her SVT which is totally abated.  She has had some intermittent bradycardia.  Overall she feels well on the medication.  We will plan to continue it.  Her blood pressure was elevated somewhat today however she says has been lower at home.  Continue her current medications.  She is interested in seeing whether her hemoglobin has improved with therapy.  She does not have follow-up until next month in the cancer center.  We will check a CBC today.  Plan follow-up with me annually or sooner as necessary.  Pixie Casino, MD, [facility], [place] Director of the Advanced Lipid Disorders &  Cardiovascular Risk Reduction Clinic Diplomate of the American Board of Clinical Lipidology Attending Cardiologist  Direct Dial: [phone]  Fax: [phone]  Website:  [email or web address] 10/13/2020, 12:02 PM

## 2020-10-13 NOTE — Patient Instructions (Signed)
Medication Instructions:  No Changes In Medications at this time.  *If you need a refill on your cardiac medications before your next appointment, please call your pharmacy*  Lab Work: [place] If you have labs (blood work) drawn today and your tests are completely normal, you will receive your results only by: [place] (if you have MyChart) OR A paper copy in the mail If you have any lab test that is abnormal or we need to change your treatment, we will call you to review the results.  Follow-Up: At [facility], you and your health needs are our priority.  As part of our continuing mission to provide you with exceptional heart care, we have created designated Provider Care Teams.  These Care Teams include your primary Cardiologist (physician) and Advanced Practice Providers (APPs -  Physician Assistants and Nurse Practitioners) who all work together to provide you with the care you need, when you need it.  Your next appointment:   1 year(s)  The format for your next appointment:   In Person  Provider:   K. Mali Hilty, MD

## 2020-10-22 LAB — HM MAMMOGRAPHY

## 2020-10-28 ENCOUNTER — Encounter: Payer: Self-pay | Admitting: Internal Medicine

## 2020-11-04 ENCOUNTER — Ambulatory Visit: Payer: Medicare Other

## 2020-11-04 ENCOUNTER — Ambulatory Visit: Payer: Medicare Other | Admitting: Hematology and Oncology

## 2020-11-04 ENCOUNTER — Other Ambulatory Visit: Payer: Medicare Other

## 2020-11-05 ENCOUNTER — Inpatient Hospital Stay: Payer: Medicare Other

## 2020-11-05 ENCOUNTER — Inpatient Hospital Stay: Payer: Medicare Other | Attending: Hematology and Oncology

## 2020-11-05 ENCOUNTER — Encounter: Payer: Self-pay | Admitting: Hematology and Oncology

## 2020-11-05 ENCOUNTER — Inpatient Hospital Stay (HOSPITAL_BASED_OUTPATIENT_CLINIC_OR_DEPARTMENT_OTHER): Payer: Medicare Other | Admitting: Hematology and Oncology

## 2020-11-05 ENCOUNTER — Other Ambulatory Visit: Payer: Self-pay

## 2020-11-05 ENCOUNTER — Other Ambulatory Visit: Payer: Self-pay | Admitting: Hematology and Oncology

## 2020-11-05 VITALS — BP 119/50 | HR 61 | Temp 98.1°F | Resp 16

## 2020-11-05 DIAGNOSIS — D649 Anemia, unspecified: Secondary | ICD-10-CM

## 2020-11-05 DIAGNOSIS — D539 Nutritional anemia, unspecified: Secondary | ICD-10-CM

## 2020-11-05 DIAGNOSIS — D72819 Decreased white blood cell count, unspecified: Secondary | ICD-10-CM

## 2020-11-05 DIAGNOSIS — D508 Other iron deficiency anemias: Secondary | ICD-10-CM | POA: Insufficient documentation

## 2020-11-05 DIAGNOSIS — D500 Iron deficiency anemia secondary to blood loss (chronic): Secondary | ICD-10-CM

## 2020-11-05 LAB — RETICULOCYTES
Immature Retic Fract: 31.7 % — ABNORMAL HIGH (ref 2.3–15.9)
RBC.: 2.85 MIL/uL — ABNORMAL LOW (ref 3.87–5.11)
Retic Count, Absolute: 87.2 10*3/uL (ref 19.0–186.0)
Retic Ct Pct: 3.1 % (ref 0.4–3.1)

## 2020-11-05 LAB — CBC WITH DIFFERENTIAL/PLATELET
Abs Immature Granulocytes: 0.01 10*3/uL (ref 0.00–0.07)
Basophils Absolute: 0 10*3/uL (ref 0.0–0.1)
Basophils Relative: 1 %
Eosinophils Absolute: 0.2 10*3/uL (ref 0.0–0.5)
Eosinophils Relative: 5 %
HCT: 23.6 % — ABNORMAL LOW (ref 36.0–46.0)
Hemoglobin: 7.3 g/dL — ABNORMAL LOW (ref 12.0–15.0)
Immature Granulocytes: 0 %
Lymphocytes Relative: 29 %
Lymphs Abs: 0.9 10*3/uL (ref 0.7–4.0)
MCH: 26.1 pg (ref 26.0–34.0)
MCHC: 30.9 g/dL (ref 30.0–36.0)
MCV: 84.3 fL (ref 80.0–100.0)
Monocytes Absolute: 0.3 10*3/uL (ref 0.1–1.0)
Monocytes Relative: 10 %
Neutro Abs: 1.7 10*3/uL (ref 1.7–7.7)
Neutrophils Relative %: 55 %
Platelets: 254 10*3/uL (ref 150–400)
RBC: 2.8 MIL/uL — ABNORMAL LOW (ref 3.87–5.11)
RDW: 18.5 % — ABNORMAL HIGH (ref 11.5–15.5)
WBC: 3.1 10*3/uL — ABNORMAL LOW (ref 4.0–10.5)
nRBC: 0 % (ref 0.0–0.2)

## 2020-11-05 LAB — PREPARE RBC (CROSSMATCH)

## 2020-11-05 LAB — BASIC METABOLIC PANEL - CANCER CENTER ONLY
Anion gap: 9 (ref 5–15)
BUN: 20 mg/dL (ref 8–23)
CO2: 29 mmol/L (ref 22–32)
Calcium: 9.2 mg/dL (ref 8.9–10.3)
Chloride: 105 mmol/L (ref 98–111)
Creatinine: 0.91 mg/dL (ref 0.44–1.00)
GFR, Estimated: >60 mL/min (ref >60)
Glucose, Bld: 72 mg/dL (ref 70–99)
Potassium: 4.1 mmol/L (ref 3.5–5.1)
Sodium: 143 mmol/L (ref 135–145)

## 2020-11-05 LAB — IRON AND TIBC
Iron: 47 ug/dL (ref 41–142)
Saturation Ratios: 14 % — ABNORMAL LOW (ref 21–57)
TIBC: 345 ug/dL (ref 236–444)
UIBC: 298 ug/dL (ref 120–384)

## 2020-11-05 LAB — FERRITIN: Ferritin: 23 ng/mL (ref 11–307)

## 2020-11-05 LAB — VITAMIN B12: Vitamin B-12: 683 pg/mL (ref 180–914)

## 2020-11-05 MED ORDER — ACETAMINOPHEN 325 MG PO TABS
650.0000 mg | ORAL_TABLET | Freq: Once | ORAL | Status: AC
Start: 2020-11-05 — End: 2020-11-05
  Administered 2020-11-05: 650 mg via ORAL

## 2020-11-05 MED ORDER — SODIUM CHLORIDE 0.9 % IV SOLN
300.0000 mg | Freq: Once | INTRAVENOUS | Status: AC
  Administered 2020-11-05: 300 mg via INTRAVENOUS
  Filled 2020-11-05: qty 300

## 2020-11-05 MED ORDER — SODIUM CHLORIDE 0.9% IV SOLUTION
250.0000 mL | Freq: Once | INTRAVENOUS | Status: AC
Start: 2020-11-05 — End: 2020-11-05
  Administered 2020-11-05: 250 mL via INTRAVENOUS
  Filled 2020-11-05: qty 250

## 2020-11-05 MED ORDER — DIPHENHYDRAMINE HCL 25 MG PO CAPS
25.0000 mg | ORAL_CAPSULE | Freq: Once | ORAL | Status: AC
  Administered 2020-11-05: 25 mg via ORAL

## 2020-11-05 MED ORDER — DIPHENHYDRAMINE HCL 25 MG PO CAPS
ORAL_CAPSULE | ORAL | Status: AC
  Filled 2020-11-05: qty 1

## 2020-11-05 MED ORDER — ACETAMINOPHEN 325 MG PO TABS
ORAL_TABLET | ORAL | Status: AC
  Filled 2020-11-05: qty 2

## 2020-11-05 MED ORDER — SODIUM CHLORIDE 0.9 % IV SOLN
INTRAVENOUS | Status: DC
  Filled 2020-11-05: qty 250

## 2020-11-05 NOTE — Patient Instructions (Signed)
Iron Sucrose injection What is this medicine? IRON SUCROSE (AHY ern SOO krohs) is an iron complex. Iron is used to make healthy red blood cells, which carry oxygen and nutrients throughout the body. This medicine is used to treat iron deficiency anemia in people with chronic kidney disease. This medicine may be used for other purposes; ask your health care provider or pharmacist if you have questions. COMMON BRAND NAME(S): Venofer What should I tell my health care provider before I take this medicine? They need to know if you have any of these conditions: anemia not caused by low iron levels heart disease high levels of iron in the blood kidney disease liver disease an unusual or allergic reaction to iron, other medicines, foods, dyes, or preservatives pregnant or trying to get pregnant breast-feeding How should I use this medicine? This medicine is for infusion into a vein. It is given by a health care professional in a hospital or clinic setting. Talk to your pediatrician regarding the use of this medicine in children. While this drug may be prescribed for children as young as 2 years for selected conditions, precautions do apply. Overdosage: If you think you have taken too much of this medicine contact a poison control center or emergency room at once. NOTE: This medicine is only for you. Do not share this medicine with others. What if I miss a dose? It is important not to miss your dose. Call your doctor or health care professional if you are unable to keep an appointment. What may interact with this medicine? Do not take this medicine with any of the following medications: deferoxamine dimercaprol other iron products This medicine may also interact with the following medications: chloramphenicol deferasirox This list may not describe all possible interactions. Give your health care provider a list of all the medicines, herbs, non-prescription drugs, or dietary supplements you use.  Also tell them if you smoke, drink alcohol, or use illegal drugs. Some items may interact with your medicine. What should I watch for while using this medicine? Visit your doctor or healthcare professional regularly. Tell your doctor or healthcare professional if your symptoms do not start to get better or if they get worse. You may need blood work done while you are taking this medicine. You may need to follow a special diet. Talk to your doctor. Foods that contain iron include: whole grains/cereals, dried fruits, beans, or peas, leafy green vegetables, and organ meats (liver, kidney). What side effects may I notice from receiving this medicine? Side effects that you should report to your doctor or health care professional as soon as possible: allergic reactions like skin rash, itching or hives, swelling of the face, lips, or tongue breathing problems changes in blood pressure cough fast, irregular heartbeat feeling faint or lightheaded, falls fever or chills flushing, sweating, or hot feelings joint or muscle aches/pains seizures swelling of the ankles or feet unusually weak or tired Side effects that usually do not require medical attention (report to your doctor or health care professional if they continue or are bothersome): diarrhea feeling achy headache irritation at site where injected nausea, vomiting stomach upset tiredness This list may not describe all possible side effects. Call your doctor for medical advice about side effects. You may report side effects to FDA at 1-800-FDA-1088. Where should I keep my medicine? This drug is given in a hospital or clinic and will not be stored at home. NOTE: This sheet is a summary. It may not cover all possible information. If  you have questions about this medicine, talk to your doctor, pharmacist, or health care provider.  2021 Elsevier/Gold Standard (2011-01-20 17:14:35)  Blood Transfusion, Adult A blood transfusion is a procedure  in which you receive blood through an IV tube. You may need this procedure because of: A bleeding disorder. An illness. An injury. A surgery. The blood may come from someone else (a donor). You may also be able to donate blood for yourself. The blood given in a transfusion is made up of different types of cells. You may get: Red blood cells. These carry oxygen to the cells in the body. White blood cells. These help you fight infections. Platelets. These help your blood to clot. Plasma. This is the liquid part of your blood. It carries proteins and other substances through the body. If you have a clotting disorder, you may also get other types of blood products. Tell your doctor about: Any blood disorders you have. Any reactions you have had during a blood transfusion in the past. Any allergies you have. All medicines you are taking, including vitamins, herbs, eye drops, creams, and over-the-counter medicines. Any surgeries you have had. Any medical conditions you have. This includes any recent fever or cold symptoms. Whether you are pregnant or may be pregnant. What are the risks? Generally, this is a safe procedure. However, problems may occur. The most common problems include: A mild allergic reaction. This includes red, swollen areas of skin (hives) and itching. Fever or chills. This may be the body's response to new blood cells received. This may happen during or up to 4 hours after the transfusion. More serious problems may include: Too much fluid in the lungs. This may cause breathing problems. A serious allergic reaction. This includes breathing trouble or swelling around the face and lips. Lung injury. This causes breathing trouble and low oxygen in the blood. This can happen within hours of the transfusion or days later. Too much iron. This can happen after getting many blood transfusions over a period of time. An infection or virus passed through the blood. This is rare.  Donated blood is carefully tested before it is given. Your body's defense system (immune system) trying to attack the new blood cells. This is rare. Symptoms may include fever, chills, nausea, low blood pressure, and low back or chest pain. Donated cells attacking healthy tissues. This is rare. What happens before the procedure? Medicines Ask your doctor about: Changing or stopping your normal medicines. This is important. Taking aspirin and ibuprofen. Do not take these medicines unless your doctor tells you to take them. Taking over-the-counter medicines, vitamins, herbs, and supplements. General instructions Follow instructions from your doctor about what you cannot eat or drink. You will have a blood test to find out your blood type. The test also finds out what type of blood your body will accept and matches it to the donor type. If you are going to have a planned surgery, you may be able to donate your own blood. This may be done in case you need a transfusion. You will have your temperature, blood pressure, and pulse checked. You may receive medicine to help prevent an allergic reaction. This may be done if you have had a reaction to a transfusion before. This medicine may be given to you by mouth or through an IV tube. This procedure lasts about 1-4 hours. Plan for the time you need. What happens during the procedure?  An IV tube will be put into one of your  veins. The bag of donated blood will be attached to your IV tube. Then, the blood will enter through your vein. Your temperature, blood pressure, and pulse will be checked often. This is done to find early signs of a transfusion reaction. Tell your nurse right away if you have any of these symptoms: Shortness of breath or trouble breathing. Chest or back pain. Fever or chills. Red, swollen areas of skin or itching. If you have any signs or symptoms of a reaction, your transfusion will be stopped. You may also be given  medicine. When the transfusion is finished, your IV tube will be taken out. Pressure may be put on the IV site for a few minutes. A bandage (dressing) will be put on the IV site. The procedure may vary among doctors and hospitals. What happens after the procedure? You will be monitored until you leave the hospital or clinic. This includes checking your temperature, blood pressure, pulse, breathing rate, and blood oxygen level. Your blood may be tested to see how you are responding to the transfusion. You may be warmed with fluids or blankets. This is done to keep the temperature of your body normal. If you have your procedure in an outpatient setting, you will be told whom to contact to report any reactions. Where to find more information To learn more, visit the [employer]: redcross.org Summary A blood transfusion is a procedure in which you are given blood through an IV tube. The blood may come from someone else (a donor). You may also be able to donate blood for yourself. The blood you are given is made up of different blood cells. You may receive red blood cells, platelets, plasma, or white blood cells. Your temperature, blood pressure, and pulse will be checked often. After the procedure, your blood may be tested to see how you are responding. This information is not intended to replace advice given to you by your health care provider. Make sure you discuss any questions you have with your healthcare provider. Document Revised: 10/04/2018 Document Reviewed: 10/04/2018 Elsevier Patient Education  [place].

## 2020-11-05 NOTE — Progress Notes (Signed)
[facility] OFFICE PROGRESS NOTE  Belinda Harper, Belinda Brooking, MD  ASSESSMENT & PLAN:  Iron deficiency anemia due to chronic blood loss She has multifactorial anemia She is symptomatic We will proceed with blood transfusion support I plan to increase intravenous iron infusion to 300 mg x 3 doses She is noted to be taking oral iron and zinc that could cause malabsorption of minerals and I have advised her to stop I am also checking B12 level and serum erythropoietin level If she is not able to maintain an iron level despite numerous transfusion support, she will need to proceed with repeat endoscopy evaluation to look for source of bleeding  We discussed some of the risks, benefits, and alternatives of blood transfusions. The patient is symptomatic from anemia and the hemoglobin level is critically low.  Some of the side-effects to be expected including risks of transfusion reactions, chills, infection, syndrome of volume overload and risk of hospitalization from various reasons and the patient is willing to proceed and went ahead to sign consent today. 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 We discussed some of the risks, benefits, and alternatives of intravenous iron infusions. The patient is symptomatic from anemia and the iron level is critically low. She tolerated oral iron supplement poorly and desires to achieved higher levels of iron faster for adequate hematopoesis. Some of the side-effects to be expected including risks of infusion reactions, phlebitis, headaches, nausea and fatigue.  The patient is willing to proceed. Patient education material was dispensed.  Goal is to keep ferritin level greater than 100 and resolution of anemia   Leukopenia She has chronic intermittent leukopenia I will repeat vitamin B12 study She is not symptomatic Observe for now  Orders Placed This Encounter  Procedures   Prepare RBC (crossmatch)     Standing Status:   Standing    Number of Occurrences:   1    Order Specific Question:   # of Units    Answer:   1 unit    Order Specific Question:   Transfusion Indications    Answer:   Symptomatic Anemia    Order Specific Question:   Number of Units to Keep Ahead    Answer:   NO units ahead    Order Specific Question:   Instructions:    Answer:   Transfuse    Order Specific Question:   If emergent release call blood bank    Answer:   Not emergent release   Sample to Blood Bank    Standing Status:   Standing    Number of Occurrences:   33    Standing Expiration Date:   11/05/2021   ABO/Rh    Standing Status:   Future    Number of Occurrences:   1    Standing Expiration Date:   11/05/2021    The total time spent in the appointment was 30 minutes encounter with patients including review of chart and various tests results, discussions about plan of care and coordination of care plan   All questions were answered. The patient knows to call the clinic with any problems, questions or concerns. No barriers to learning was detected.    Belinda Lark, MD 7/14/20221:14 PM  INTERVAL HISTORY: Belinda Harper 80 y.o. female returns for severe recurrent iron deficiency anemia She complained of excessive fatigue breath on exertion She has occasional rare hemorrhoidal bleeding but that is not consistent The patient denies any recent signs  or symptoms of bleeding such as spontaneous epistaxis, hematuria or hemoptysis.  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 Summary of her history is as follows: This is a pleasant woman with multifactorial anemia and associated leukopenia. 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 The chronic anemia and leukopenia go back as far as we have records (2007) when white counts ran as low as 2700. She has a normal differential. White count average is 3000 and has not changed. Platelet count is normal. Best hemoglobin she achieves with parenteral iron replacement is 11 g. She had a nondiagnostic bone marrow biopsy done in 2006. She had received intravenous iron infusion in the past. Her last colonoscopy in May 2013 showed severe diverticulosis with internal hemorrhoids. She feels well. Denies recent infection. She denies symptoms of fatigue. She tolerated iron supplement well. She had periodic hemorrhoidal bleeding. She was found to have abnormal CBC from intermittently over the years by her primary care doctor She has started taking oral iron supplements since November of last year Most recently, she have noted passage of dark stool She attempted to increase her oral iron supplement but still complain of excessive fatigue Recently, her repeat CBC has dropped from 11-8.2 and hence she was referred back here for further evaluation  She denies recent chest pain on exertion, shortness of breath on minimal exertion, pre-syncopal episodes, or palpitations.  The patient denies over the counter NSAID ingestion. She is not on antiplatelets agents. Her last colonoscopy was in 2013 She had no prior history or diagnosis of cancer. Her age appropriate screening programs are up-to-date. She denies any pica and eats a variety of diet.  The patient was prescribed oral iron supplements and she takes 1 dose of oral iron supplement at night to avoid constipation From Sept 2021 till present, she has received many doses of intravenous iron infusion   I have reviewed the past medical history, past surgical history, social history and family history with the patient and they are unchanged from previous note.  ALLERGIES:  is allergic to sulfamethoxazole, nsaids, and sulfa  antibiotics.  MEDICATIONS:  Current Outpatient Medications  Medication Sig Dispense Refill   amLODipine (NORVASC) 5 MG tablet Take 1 tablet (5 mg total) by mouth daily. 90 tablet 3   Calcium Citrate-Vitamin D (CITRACAL + D PO) Take 1 tablet by mouth daily with lunch. 500 mg calcium (2 X 250 mg)     Cholecalciferol (VITAMIN D3 PO) Take by mouth.     Magnesium 500 MG TABS Take 500 mg by mouth daily with lunch.      Menatetrenone (VITAMIN K2) 100 MCG TABS Take 100 mcg by mouth daily.     metoprolol succinate (TOPROL-XL) 25 MG 24 hr tablet Take 0.5 tablets (12.5 mg total) by mouth at bedtime. 45 tablet 3   Multiple Vitamin (MULTIVITAMIN WITH MINERALS) TABS tablet Take 1 tablet by mouth daily. One a Day - Women's     Oral Electrolytes (EMERGEN-C ELECTRO MIX PO) Take 1,000 mg by mouth daily. 1 packet daily     No current facility-administered medications for this visit.     REVIEW OF SYSTEMS:   Constitutional: Denies fevers, chills or night sweats Eyes: Denies blurriness of vision Ears,  nose, mouth, throat, and face: Denies mucositis or sore throat Gastrointestinal:  Denies nausea, heartburn or change in bowel habits Skin: Denies abnormal skin rashes Lymphatics: Denies new lymphadenopathy or easy bruising Neurological:Denies numbness, tingling or new weaknesses Behavioral/Psych: Mood is stable, no new changes  All other systems were reviewed with the patient and are negative.  PHYSICAL EXAMINATION: ECOG PERFORMANCE STATUS: 1 - Symptomatic but completely ambulatory  Vitals:   11/05/20 1236  BP: (!) 120/48  Pulse: 70  Resp: 18  Temp: 97.9 F (36.6 C)  SpO2: 99%   Filed Weights   11/05/20 1236  Weight: 154 lb 6.4 oz (70 kg)    GENERAL:alert, no distress and comfortable.  She looks pale NEURO: alert & oriented x 3 with fluent speech, no focal motor/sensory deficits  LABORATORY DATA:  I have reviewed the data as listed     Component Value Date/Time   NA 143 11/05/2020 1211    NA 142 05/28/2012 1600   K 4.1 11/05/2020 1211   K 3.7 05/28/2012 1600   CL 105 11/05/2020 1211   CL 103 05/28/2012 1600   CO2 29 11/05/2020 1211   CO2 29 05/28/2012 1600   GLUCOSE 72 11/05/2020 1211   GLUCOSE 99 05/28/2012 1600   BUN 20 11/05/2020 1211   BUN 19.3 05/28/2012 1600   CREATININE 0.91 11/05/2020 1211   CREATININE 0.88 12/06/2019 1147   CREATININE 0.8 05/28/2012 1600   CALCIUM 9.2 11/05/2020 1211   CALCIUM 9.5 05/28/2012 1600   PROT 7.1 12/06/2019 1147   PROT 7.3 05/28/2012 1600   ALBUMIN 4.4 11/21/2018 1215   ALBUMIN 3.5 05/28/2012 1600   AST 21 12/06/2019 1147   AST 23 05/28/2012 1600   ALT 13 12/06/2019 1147   ALT 13 05/28/2012 1600   ALKPHOS 86 11/21/2018 1215   ALKPHOS 82 05/28/2012 1600   BILITOT 0.4 12/06/2019 1147   BILITOT <0.20 05/28/2012 1600   GFRNONAA >60 11/05/2020 1211   GFRAA >60 04/23/2018 1634    No results found for: SPEP, UPEP  Lab Results  Component Value Date   WBC 3.1 (L) 11/05/2020   NEUTROABS 1.7 11/05/2020   HGB 7.3 (L) 11/05/2020   HCT 23.6 (L) 11/05/2020   MCV 84.3 11/05/2020   PLT 254 11/05/2020      Chemistry      Component Value Date/Time   NA 143 11/05/2020 1211   NA 142 05/28/2012 1600   K 4.1 11/05/2020 1211   K 3.7 05/28/2012 1600   CL 105 11/05/2020 1211   CL 103 05/28/2012 1600   CO2 29 11/05/2020 1211   CO2 29 05/28/2012 1600   BUN 20 11/05/2020 1211   BUN 19.3 05/28/2012 1600   CREATININE 0.91 11/05/2020 1211   CREATININE 0.88 12/06/2019 1147   CREATININE 0.8 05/28/2012 1600      Component Value Date/Time   CALCIUM 9.2 11/05/2020 1211   CALCIUM 9.5 05/28/2012 1600   ALKPHOS 86 11/21/2018 1215   ALKPHOS 82 05/28/2012 1600   AST 21 12/06/2019 1147   AST 23 05/28/2012 1600   ALT 13 12/06/2019 1147   ALT 13 05/28/2012 1600   BILITOT 0.4 12/06/2019 1147   BILITOT <0.20 05/28/2012 1600

## 2020-11-05 NOTE — Assessment & Plan Note (Addendum)
She has multifactorial anemia She is symptomatic We will proceed with blood transfusion support I plan to increase intravenous iron infusion to 300 mg x 3 doses She is noted to be taking oral iron and zinc that could cause malabsorption of minerals and I have advised her to stop I am also checking B12 level and serum erythropoietin level If she is not able to maintain an iron level despite numerous transfusion support, she will need to proceed with repeat endoscopy evaluation to look for source of bleeding  We discussed some of the risks, benefits, and alternatives of blood transfusions. The patient is symptomatic from anemia and the hemoglobin level is critically low.  Some of the side-effects to be expected including risks of transfusion reactions, chills, infection, syndrome of volume overload and risk of hospitalization from various reasons and the patient is willing to proceed and went ahead to sign consent today. 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 We discussed some of the risks, benefits, and alternatives of intravenous iron infusions. The patient is symptomatic from anemia and the iron level is critically low. She tolerated oral iron supplement poorly and desires to achieved higher levels of iron faster for adequate hematopoesis. Some of the side-effects to be expected including risks of infusion reactions, phlebitis, headaches, nausea and fatigue.  The patient is willing to proceed. Patient education material was dispensed.  Goal is to keep ferritin level greater than 100 and resolution of anemia

## 2020-11-05 NOTE — Assessment & Plan Note (Signed)
She has chronic intermittent leukopenia I will repeat vitamin B12 study She is not symptomatic Observe for now

## 2020-11-06 LAB — TYPE AND SCREEN
ABO/RH(D): O POS
Antibody Screen: NEGATIVE
Unit division: 0

## 2020-11-06 LAB — BPAM RBC
Blood Product Expiration Date: 202208122359
ISSUE DATE / TIME: 202207141553
Unit Type and Rh: 5100

## 2020-11-06 LAB — ERYTHROPOIETIN: Erythropoietin: 187 m[IU]/mL — ABNORMAL HIGH (ref 2.6–18.5)

## 2020-11-14 ENCOUNTER — Other Ambulatory Visit: Payer: Self-pay

## 2020-11-14 ENCOUNTER — Inpatient Hospital Stay: Payer: Medicare Other

## 2020-11-14 VITALS — BP 119/58 | HR 58 | Temp 98.3°F | Resp 16

## 2020-11-14 DIAGNOSIS — D500 Iron deficiency anemia secondary to blood loss (chronic): Secondary | ICD-10-CM

## 2020-11-14 DIAGNOSIS — D508 Other iron deficiency anemias: Secondary | ICD-10-CM | POA: Diagnosis not present

## 2020-11-14 MED ORDER — IRON SUCROSE 20 MG/ML IV SOLN
300.0000 mg | Freq: Once | INTRAVENOUS | Status: AC
  Administered 2020-11-14: 300 mg via INTRAVENOUS
  Filled 2020-11-14: qty 300

## 2020-11-14 MED ORDER — DENOSUMAB 120 MG/1.7ML ~~LOC~~ SOLN
SUBCUTANEOUS | Status: AC
  Filled 2020-11-14: qty 1.7

## 2020-11-14 MED ORDER — SODIUM CHLORIDE 0.9 % IV SOLN
Freq: Once | INTRAVENOUS | Status: AC
  Filled 2020-11-14: qty 250

## 2020-11-14 NOTE — Progress Notes (Signed)
Patient was observed for 30 minutes post iron infusion with no complaints. Vitals stable upon leaving infusion clinic.

## 2020-11-14 NOTE — Patient Instructions (Signed)
Iron Sucrose injection What is this medicine? IRON SUCROSE (AHY ern SOO krohs) is an iron complex. Iron is used to make healthy red blood cells, which carry oxygen and nutrients throughout the body. This medicine is used to treat iron deficiency anemia in people with chronic kidney disease. This medicine may be used for other purposes; ask your health care provider or pharmacist if you have questions. COMMON BRAND NAME(S): Venofer What should I tell my health care provider before I take this medicine? They need to know if you have any of these conditions: anemia not caused by low iron levels heart disease high levels of iron in the blood kidney disease liver disease an unusual or allergic reaction to iron, other medicines, foods, dyes, or preservatives pregnant or trying to get pregnant breast-feeding How should I use this medicine? This medicine is for infusion into a vein. It is given by a health care professional in a hospital or clinic setting. Talk to your pediatrician regarding the use of this medicine in children. While this drug may be prescribed for children as young as 2 years for selected conditions, precautions do apply. Overdosage: If you think you have taken too much of this medicine contact a poison control center or emergency room at once. NOTE: This medicine is only for you. Do not share this medicine with others. What if I miss a dose? It is important not to miss your dose. Call your doctor or health care professional if you are unable to keep an appointment. What may interact with this medicine? Do not take this medicine with any of the following medications: deferoxamine dimercaprol other iron products This medicine may also interact with the following medications: chloramphenicol deferasirox This list may not describe all possible interactions. Give your health care provider a list of all the medicines, herbs, non-prescription drugs, or dietary supplements you use.  Also tell them if you smoke, drink alcohol, or use illegal drugs. Some items may interact with your medicine. What should I watch for while using this medicine? Visit your doctor or healthcare professional regularly. Tell your doctor or healthcare professional if your symptoms do not start to get better or if they get worse. You may need blood work done while you are taking this medicine. You may need to follow a special diet. Talk to your doctor. Foods that contain iron include: whole grains/cereals, dried fruits, beans, or peas, leafy green vegetables, and organ meats (liver, kidney). What side effects may I notice from receiving this medicine? Side effects that you should report to your doctor or health care professional as soon as possible: allergic reactions like skin rash, itching or hives, swelling of the face, lips, or tongue breathing problems changes in blood pressure cough fast, irregular heartbeat feeling faint or lightheaded, falls fever or chills flushing, sweating, or hot feelings joint or muscle aches/pains seizures swelling of the ankles or feet unusually weak or tired Side effects that usually do not require medical attention (report to your doctor or health care professional if they continue or are bothersome): diarrhea feeling achy headache irritation at site where injected nausea, vomiting stomach upset tiredness This list may not describe all possible side effects. Call your doctor for medical advice about side effects. You may report side effects to FDA at 1-800-FDA-1088. Where should I keep my medicine? This drug is given in a hospital or clinic and will not be stored at home. NOTE: This sheet is a summary. It may not cover all possible information. If  you have questions about this medicine, talk to your doctor, pharmacist, or health care provider.  2021 Elsevier/Gold Standard (2011-01-20 17:14:35)  Blood Transfusion, Adult A blood transfusion is a procedure  in which you receive blood through an IV tube. You may need this procedure because of: A bleeding disorder. An illness. An injury. A surgery. The blood may come from someone else (a donor). You may also be able to donate blood for yourself. The blood given in a transfusion is made up of different types of cells. You may get: Red blood cells. These carry oxygen to the cells in the body. White blood cells. These help you fight infections. Platelets. These help your blood to clot. Plasma. This is the liquid part of your blood. It carries proteins and other substances through the body. If you have a clotting disorder, you may also get other types of blood products. Tell your doctor about: Any blood disorders you have. Any reactions you have had during a blood transfusion in the past. Any allergies you have. All medicines you are taking, including vitamins, herbs, eye drops, creams, and over-the-counter medicines. Any surgeries you have had. Any medical conditions you have. This includes any recent fever or cold symptoms. Whether you are pregnant or may be pregnant. What are the risks? Generally, this is a safe procedure. However, problems may occur. The most common problems include: A mild allergic reaction. This includes red, swollen areas of skin (hives) and itching. Fever or chills. This may be the body's response to new blood cells received. This may happen during or up to 4 hours after the transfusion. More serious problems may include: Too much fluid in the lungs. This may cause breathing problems. A serious allergic reaction. This includes breathing trouble or swelling around the face and lips. Lung injury. This causes breathing trouble and low oxygen in the blood. This can happen within hours of the transfusion or days later. Too much iron. This can happen after getting many blood transfusions over a period of time. An infection or virus passed through the blood. This is rare.  Donated blood is carefully tested before it is given. Your body's defense system (immune system) trying to attack the new blood cells. This is rare. Symptoms may include fever, chills, nausea, low blood pressure, and low back or chest pain. Donated cells attacking healthy tissues. This is rare. What happens before the procedure? Medicines Ask your doctor about: Changing or stopping your normal medicines. This is important. Taking aspirin and ibuprofen. Do not take these medicines unless your doctor tells you to take them. Taking over-the-counter medicines, vitamins, herbs, and supplements. General instructions Follow instructions from your doctor about what you cannot eat or drink. You will have a blood test to find out your blood type. The test also finds out what type of blood your body will accept and matches it to the donor type. If you are going to have a planned surgery, you may be able to donate your own blood. This may be done in case you need a transfusion. You will have your temperature, blood pressure, and pulse checked. You may receive medicine to help prevent an allergic reaction. This may be done if you have had a reaction to a transfusion before. This medicine may be given to you by mouth or through an IV tube. This procedure lasts about 1-4 hours. Plan for the time you need. What happens during the procedure?  An IV tube will be put into one of your  veins. The bag of donated blood will be attached to your IV tube. Then, the blood will enter through your vein. Your temperature, blood pressure, and pulse will be checked often. This is done to find early signs of a transfusion reaction. Tell your nurse right away if you have any of these symptoms: Shortness of breath or trouble breathing. Chest or back pain. Fever or chills. Red, swollen areas of skin or itching. If you have any signs or symptoms of a reaction, your transfusion will be stopped. You may also be given  medicine. When the transfusion is finished, your IV tube will be taken out. Pressure may be put on the IV site for a few minutes. A bandage (dressing) will be put on the IV site. The procedure may vary among doctors and hospitals. What happens after the procedure? You will be monitored until you leave the hospital or clinic. This includes checking your temperature, blood pressure, pulse, breathing rate, and blood oxygen level. Your blood may be tested to see how you are responding to the transfusion. You may be warmed with fluids or blankets. This is done to keep the temperature of your body normal. If you have your procedure in an outpatient setting, you will be told whom to contact to report any reactions. Where to find more information To learn more, visit the American Red Cross: redcross.org Summary A blood transfusion is a procedure in which you are given blood through an IV tube. The blood may come from someone else (a donor). You may also be able to donate blood for yourself. The blood you are given is made up of different blood cells. You may receive red blood cells, platelets, plasma, or white blood cells. Your temperature, blood pressure, and pulse will be checked often. After the procedure, your blood may be tested to see how you are responding. This information is not intended to replace advice given to you by your health care provider. Make sure you discuss any questions you have with your healthcare provider. Document Revised: 10/04/2018 Document Reviewed: 10/04/2018 Elsevier Patient Education  [place].

## 2020-11-18 ENCOUNTER — Other Ambulatory Visit: Payer: Self-pay

## 2020-11-18 ENCOUNTER — Encounter: Payer: Self-pay | Admitting: Obstetrics & Gynecology

## 2020-11-18 ENCOUNTER — Ambulatory Visit (INDEPENDENT_AMBULATORY_CARE_PROVIDER_SITE_OTHER): Payer: Medicare Other | Admitting: Obstetrics & Gynecology

## 2020-11-18 VITALS — BP 114/72 | HR 56 | Resp 16

## 2020-11-18 DIAGNOSIS — Z4689 Encounter for fitting and adjustment of other specified devices: Secondary | ICD-10-CM

## 2020-11-18 DIAGNOSIS — N812 Incomplete uterovaginal prolapse: Secondary | ICD-10-CM | POA: Diagnosis not present

## 2020-11-18 NOTE — Progress Notes (Signed)
    Belinda Harper 01/14/1941 [id number]        80 y.o.  [id number]   RP: Pessary maintenance  HPI: Doing very well on Milex ring #5 with support. Very mild vaginal discharge x 3 weeks, no itching or odor, no bleeding, no vaginal pain.  Urine/BMs normal.     OB History  Gravida Para Term Preterm AB Living  '3 2     1 2  '$ SAB IAB Ectopic Multiple Live Births  1            # Outcome Date GA Lbr Len/2nd Weight Sex Delivery Anes PTL Lv  3 SAB           2 Para           1 Para             Past medical history,surgical history, problem list, medications, allergies, family history and social history were all reviewed and documented in the EPIC chart.   Directed ROS with pertinent positives and negatives documented in the history of present illness/assessment and plan.  Exam:  Vitals:   11/18/20 0954  BP: 114/72  Pulse: (!) 56  Resp: 16   General appearance:  Normal  Abdomen: Normal   Gynecologic exam: Vulva normal.  Pessary removed easily, no abnormal discharge.  Pessary cleaned.  Vaginal exam revealed intact vaginal mucosa.  Pessary inserted in vagina easily, good fit.     Assessment/Plan:  80 y.o. [id number]   1. Encounter for pessary maintenance Doing very well with Milex ring #5 with support.  No complication.  Will f/u in 4-5 months for pessary maintenance.   [name] MD, 10:13 AM 11/18/2020

## 2020-11-19 ENCOUNTER — Telehealth: Payer: Self-pay | Admitting: Internal Medicine

## 2020-11-19 NOTE — Telephone Encounter (Signed)
Left message for patient to call back and schedule Medicare Annual Wellness Visit (AWV) either virtually or in office.   Last AWV 12/05/19 ; please schedule at anytime with [facility] Nurse Health Advisor 1 or 2  Patient can have sooner calendar year uhc  This should be a 45 minute visit.

## 2020-11-20 ENCOUNTER — Inpatient Hospital Stay: Payer: Medicare Other

## 2020-11-20 ENCOUNTER — Other Ambulatory Visit: Payer: Self-pay

## 2020-11-20 VITALS — BP 134/58 | HR 62 | Temp 98.2°F | Resp 18

## 2020-11-20 DIAGNOSIS — D500 Iron deficiency anemia secondary to blood loss (chronic): Secondary | ICD-10-CM

## 2020-11-20 DIAGNOSIS — D508 Other iron deficiency anemias: Secondary | ICD-10-CM | POA: Diagnosis not present

## 2020-11-20 MED ORDER — SODIUM CHLORIDE 0.9 % IV SOLN
INTRAVENOUS | Status: DC
  Filled 2020-11-20: qty 250

## 2020-11-20 MED ORDER — SODIUM CHLORIDE 0.9 % IV SOLN
300.0000 mg | Freq: Once | INTRAVENOUS | Status: AC
  Administered 2020-11-20: 300 mg via INTRAVENOUS
  Filled 2020-11-20: qty 300

## 2020-11-20 NOTE — Patient Instructions (Signed)
Iron Sucrose injection What is this medicine? IRON SUCROSE (AHY ern SOO krohs) is an iron complex. Iron is used to make healthy red blood cells, which carry oxygen and nutrients throughout the body. This medicine is used to treat iron deficiency anemia in people with chronic kidney disease. This medicine may be used for other purposes; ask your health care provider or pharmacist if you have questions. COMMON BRAND NAME(S): Venofer What should I tell my health care provider before I take this medicine? They need to know if you have any of these conditions: anemia not caused by low iron levels heart disease high levels of iron in the blood kidney disease liver disease an unusual or allergic reaction to iron, other medicines, foods, dyes, or preservatives pregnant or trying to get pregnant breast-feeding How should I use this medicine? This medicine is for infusion into a vein. It is given by a health care professional in a hospital or clinic setting. Talk to your pediatrician regarding the use of this medicine in children. While this drug may be prescribed for children as young as 2 years for selected conditions, precautions do apply. Overdosage: If you think you have taken too much of this medicine contact a poison control center or emergency room at once. NOTE: This medicine is only for you. Do not share this medicine with others. What if I miss a dose? It is important not to miss your dose. Call your doctor or health care professional if you are unable to keep an appointment. What may interact with this medicine? Do not take this medicine with any of the following medications: deferoxamine dimercaprol other iron products This medicine may also interact with the following medications: chloramphenicol deferasirox This list may not describe all possible interactions. Give your health care provider a list of all the medicines, herbs, non-prescription drugs, or dietary supplements you use.  Also tell them if you smoke, drink alcohol, or use illegal drugs. Some items may interact with your medicine. What should I watch for while using this medicine? Visit your doctor or healthcare professional regularly. Tell your doctor or healthcare professional if your symptoms do not start to get better or if they get worse. You may need blood work done while you are taking this medicine. You may need to follow a special diet. Talk to your doctor. Foods that contain iron include: whole grains/cereals, dried fruits, beans, or peas, leafy green vegetables, and organ meats (liver, kidney). What side effects may I notice from receiving this medicine? Side effects that you should report to your doctor or health care professional as soon as possible: allergic reactions like skin rash, itching or hives, swelling of the face, lips, or tongue breathing problems changes in blood pressure cough fast, irregular heartbeat feeling faint or lightheaded, falls fever or chills flushing, sweating, or hot feelings joint or muscle aches/pains seizures swelling of the ankles or feet unusually weak or tired Side effects that usually do not require medical attention (report to your doctor or health care professional if they continue or are bothersome): diarrhea feeling achy headache irritation at site where injected nausea, vomiting stomach upset tiredness This list may not describe all possible side effects. Call your doctor for medical advice about side effects. You may report side effects to FDA at 1-800-FDA-1088. Where should I keep my medicine? This drug is given in a hospital or clinic and will not be stored at home. NOTE: This sheet is a summary. It may not cover all possible information. If  you have questions about this medicine, talk to your doctor, pharmacist, or health care provider.  2021 Elsevier/Gold Standard (2011-01-20 17:14:35)  Blood Transfusion, Adult A blood transfusion is a procedure  in which you receive blood through an IV tube. You may need this procedure because of: A bleeding disorder. An illness. An injury. A surgery. The blood may come from someone else (a donor). You may also be able to donate blood for yourself. The blood given in a transfusion is made up of different types of cells. You may get: Red blood cells. These carry oxygen to the cells in the body. White blood cells. These help you fight infections. Platelets. These help your blood to clot. Plasma. This is the liquid part of your blood. It carries proteins and other substances through the body. If you have a clotting disorder, you may also get other types of blood products. Tell your doctor about: Any blood disorders you have. Any reactions you have had during a blood transfusion in the past. Any allergies you have. All medicines you are taking, including vitamins, herbs, eye drops, creams, and over-the-counter medicines. Any surgeries you have had. Any medical conditions you have. This includes any recent fever or cold symptoms. Whether you are pregnant or may be pregnant. What are the risks? Generally, this is a safe procedure. However, problems may occur. The most common problems include: A mild allergic reaction. This includes red, swollen areas of skin (hives) and itching. Fever or chills. This may be the body's response to new blood cells received. This may happen during or up to 4 hours after the transfusion. More serious problems may include: Too much fluid in the lungs. This may cause breathing problems. A serious allergic reaction. This includes breathing trouble or swelling around the face and lips. Lung injury. This causes breathing trouble and low oxygen in the blood. This can happen within hours of the transfusion or days later. Too much iron. This can happen after getting many blood transfusions over a period of time. An infection or virus passed through the blood. This is rare.  Donated blood is carefully tested before it is given. Your body's defense system (immune system) trying to attack the new blood cells. This is rare. Symptoms may include fever, chills, nausea, low blood pressure, and low back or chest pain. Donated cells attacking healthy tissues. This is rare. What happens before the procedure? Medicines Ask your doctor about: Changing or stopping your normal medicines. This is important. Taking aspirin and ibuprofen. Do not take these medicines unless your doctor tells you to take them. Taking over-the-counter medicines, vitamins, herbs, and supplements. General instructions Follow instructions from your doctor about what you cannot eat or drink. You will have a blood test to find out your blood type. The test also finds out what type of blood your body will accept and matches it to the donor type. If you are going to have a planned surgery, you may be able to donate your own blood. This may be done in case you need a transfusion. You will have your temperature, blood pressure, and pulse checked. You may receive medicine to help prevent an allergic reaction. This may be done if you have had a reaction to a transfusion before. This medicine may be given to you by mouth or through an IV tube. This procedure lasts about 1-4 hours. Plan for the time you need. What happens during the procedure?  An IV tube will be put into one of your  veins. The bag of donated blood will be attached to your IV tube. Then, the blood will enter through your vein. Your temperature, blood pressure, and pulse will be checked often. This is done to find early signs of a transfusion reaction. Tell your nurse right away if you have any of these symptoms: Shortness of breath or trouble breathing. Chest or back pain. Fever or chills. Red, swollen areas of skin or itching. If you have any signs or symptoms of a reaction, your transfusion will be stopped. You may also be given  medicine. When the transfusion is finished, your IV tube will be taken out. Pressure may be put on the IV site for a few minutes. A bandage (dressing) will be put on the IV site. The procedure may vary among doctors and hospitals. What happens after the procedure? You will be monitored until you leave the hospital or clinic. This includes checking your temperature, blood pressure, pulse, breathing rate, and blood oxygen level. Your blood may be tested to see how you are responding to the transfusion. You may be warmed with fluids or blankets. This is done to keep the temperature of your body normal. If you have your procedure in an outpatient setting, you will be told whom to contact to report any reactions. Where to find more information To learn more, visit the American Red Cross: redcross.org Summary A blood transfusion is a procedure in which you are given blood through an IV tube. The blood may come from someone else (a donor). You may also be able to donate blood for yourself. The blood you are given is made up of different blood cells. You may receive red blood cells, platelets, plasma, or white blood cells. Your temperature, blood pressure, and pulse will be checked often. After the procedure, your blood may be tested to see how you are responding. This information is not intended to replace advice given to you by your health care provider. Make sure you discuss any questions you have with your healthcare provider. Document Revised: 10/04/2018 Document Reviewed: 10/04/2018 Elsevier Patient Education  [place].

## 2020-11-23 ENCOUNTER — Encounter: Payer: Self-pay | Admitting: Obstetrics & Gynecology

## 2020-11-27 ENCOUNTER — Other Ambulatory Visit: Payer: Self-pay

## 2020-11-27 ENCOUNTER — Inpatient Hospital Stay (HOSPITAL_BASED_OUTPATIENT_CLINIC_OR_DEPARTMENT_OTHER): Payer: Medicare Other | Admitting: Hematology and Oncology

## 2020-11-27 ENCOUNTER — Inpatient Hospital Stay: Payer: Medicare Other | Attending: Hematology and Oncology

## 2020-11-27 ENCOUNTER — Inpatient Hospital Stay: Payer: Medicare Other

## 2020-11-27 VITALS — BP 127/58 | HR 52 | Temp 99.1°F | Resp 18

## 2020-11-27 VITALS — BP 127/50 | HR 70 | Temp 97.7°F | Resp 18 | Ht 67.0 in | Wt 155.8 lb

## 2020-11-27 DIAGNOSIS — D72819 Decreased white blood cell count, unspecified: Secondary | ICD-10-CM | POA: Insufficient documentation

## 2020-11-27 DIAGNOSIS — Z79899 Other long term (current) drug therapy: Secondary | ICD-10-CM | POA: Insufficient documentation

## 2020-11-27 DIAGNOSIS — D649 Anemia, unspecified: Secondary | ICD-10-CM

## 2020-11-27 DIAGNOSIS — D500 Iron deficiency anemia secondary to blood loss (chronic): Secondary | ICD-10-CM

## 2020-11-27 DIAGNOSIS — D539 Nutritional anemia, unspecified: Secondary | ICD-10-CM

## 2020-11-27 DIAGNOSIS — D509 Iron deficiency anemia, unspecified: Secondary | ICD-10-CM | POA: Diagnosis not present

## 2020-11-27 LAB — CBC WITH DIFFERENTIAL/PLATELET
Abs Immature Granulocytes: 0.02 10*3/uL (ref 0.00–0.07)
Basophils Absolute: 0 10*3/uL (ref 0.0–0.1)
Basophils Relative: 1 %
Eosinophils Absolute: 0.1 10*3/uL (ref 0.0–0.5)
Eosinophils Relative: 3 %
HCT: 22.6 % — ABNORMAL LOW (ref 36.0–46.0)
Hemoglobin: 7 g/dL — ABNORMAL LOW (ref 12.0–15.0)
Immature Granulocytes: 1 %
Lymphocytes Relative: 26 %
Lymphs Abs: 1 10*3/uL (ref 0.7–4.0)
MCH: 27.6 pg (ref 26.0–34.0)
MCHC: 31 g/dL (ref 30.0–36.0)
MCV: 89 fL (ref 80.0–100.0)
Monocytes Absolute: 0.3 10*3/uL (ref 0.1–1.0)
Monocytes Relative: 8 %
Neutro Abs: 2.4 10*3/uL (ref 1.7–7.7)
Neutrophils Relative %: 61 %
Platelets: 196 10*3/uL (ref 150–400)
RBC: 2.54 MIL/uL — ABNORMAL LOW (ref 3.87–5.11)
RDW: 20.9 % — ABNORMAL HIGH (ref 11.5–15.5)
WBC: 3.9 10*3/uL — ABNORMAL LOW (ref 4.0–10.5)
nRBC: 0 % (ref 0.0–0.2)

## 2020-11-27 LAB — PREPARE RBC (CROSSMATCH)

## 2020-11-27 LAB — SAMPLE TO BLOOD BANK

## 2020-11-27 MED ORDER — DIPHENHYDRAMINE HCL 25 MG PO CAPS
25.0000 mg | ORAL_CAPSULE | Freq: Once | ORAL | Status: AC
  Administered 2020-11-27: 25 mg via ORAL

## 2020-11-27 MED ORDER — ACETAMINOPHEN 325 MG PO TABS
650.0000 mg | ORAL_TABLET | Freq: Once | ORAL | Status: AC
  Administered 2020-11-27: 650 mg via ORAL

## 2020-11-27 MED ORDER — ACETAMINOPHEN 325 MG PO TABS
ORAL_TABLET | ORAL | Status: AC
  Filled 2020-11-27: qty 2

## 2020-11-27 MED ORDER — DIPHENHYDRAMINE HCL 25 MG PO CAPS
ORAL_CAPSULE | ORAL | Status: AC
  Filled 2020-11-27: qty 1

## 2020-11-27 MED ORDER — SODIUM CHLORIDE 0.9 % IV SOLN
300.0000 mg | Freq: Once | INTRAVENOUS | Status: AC
  Administered 2020-11-27: 300 mg via INTRAVENOUS
  Filled 2020-11-27: qty 300

## 2020-11-27 MED ORDER — SODIUM CHLORIDE 0.9% IV SOLUTION
250.0000 mL | Freq: Once | INTRAVENOUS | Status: AC
  Administered 2020-11-27: 250 mL via INTRAVENOUS
  Filled 2020-11-27: qty 250

## 2020-11-27 NOTE — Patient Instructions (Signed)
Blood Transfusion, Adult, Care After This sheet gives you information about how to care for yourself after your procedure. Your doctor may also give you more specific instructions. If youhave problems or questions, contact your doctor. What can I expect after the procedure? After the procedure, it is common to have: Bruising and soreness at the IV site. A fever or chills on the day of the procedure. This may be your body's response to the new blood cells received. A headache. Follow these instructions at home: Insertion site care     Follow instructions from your doctor about how to take care of your insertion site. This is where an IV tube was put into your vein. Make sure you: Wash your hands with soap and water before and after you change your bandage (dressing). If you cannot use soap and water, use hand sanitizer. Change your bandage as told by your doctor. Check your insertion site every day for signs of infection. Check for: Redness, swelling, or pain. Bleeding from the site. Warmth. Pus or a bad smell. General instructions Take over-the-counter and prescription medicines only as told by your doctor. Rest as told by your doctor. Go back to your normal activities as told by your doctor. Keep all follow-up visits as told by your doctor. This is important. Contact a doctor if: You have itching or red, swollen areas of skin (hives). You feel worried or nervous (anxious). You feel weak after doing your normal activities. You have redness, swelling, warmth, or pain around the insertion site. You have blood coming from the insertion site, and the blood does not stop with pressure. You have pus or a bad smell coming from the insertion site. Get help right away if: You have signs of a serious reaction. This may be coming from an allergy or the body's defense system (immune system). Signs include: Trouble breathing or shortness of breath. Swelling of the face or feeling warm  (flushed). Fever or chills. Head, chest, or back pain. Dark pee (urine) or blood in the pee. Widespread rash. Fast heartbeat. Feeling dizzy or light-headed. You may receive your blood transfusion in an outpatient setting. If so, youwill be told whom to contact to report any reactions. These symptoms may be an emergency. Do not wait to see if the symptoms will go away. Get medical help right away. Call your local emergency services (911 in the [place] Do not drive yourself to the hospital. Summary Bruising and soreness at the IV site are common. Check your insertion site every day for signs of infection. Rest as told by your doctor. Go back to your normal activities as told by your doctor. Get help right away if you have signs of a serious reaction. This information is not intended to replace advice given to you by your health care provider. Make sure you discuss any questions you have with your healthcare provider. Document Revised: 10/04/2018 Document Reviewed: 10/04/2018 Elsevier Patient Education  2022 Else[employer] Inc.  

## 2020-11-27 NOTE — Progress Notes (Signed)
Pt stayed 30 min after iron infusion and did no have any complaints. Pt received 1 unit of PRBC, tolerated well and was discharged in stable condition, ambulatory to lobby.

## 2020-11-28 LAB — TYPE AND SCREEN
ABO/RH(D): O POS
Antibody Screen: NEGATIVE
Unit division: 0

## 2020-11-28 LAB — BPAM RBC
Blood Product Expiration Date: 202209112359
ISSUE DATE / TIME: 202208051352
Unit Type and Rh: 5100

## 2020-11-29 ENCOUNTER — Encounter: Payer: Self-pay | Admitting: Hematology and Oncology

## 2020-11-29 NOTE — Assessment & Plan Note (Signed)
Unfortunately, she continues to have significant GI bleed Concerns me that the patient did not call We will proceed with IV iron and blood transfusion I will reach out to GI service to perform endoscopy evaluation as soon as possible  We discussed some of the risks, benefits, and alternatives of blood transfusions. The patient is symptomatic from anemia and the hemoglobin level is critically low.  Some of the side-effects to be expected including risks of transfusion reactions, chills, infection, syndrome of volume overload and risk of hospitalization from various reasons and the patient is willing to proceed and went ahead to sign consent today.  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 We discussed some of the risks, benefits, and alternatives of intravenous iron infusions. The patient is symptomatic from anemia and the iron level is critically low. She tolerated oral iron supplement poorly and desires to achieved higher levels of iron faster for adequate hematopoesis. Some of the side-effects to be expected including risks of infusion reactions, phlebitis, headaches, nausea and fatigue.  The patient is willing to proceed. Patient education material was dispensed.  Goal is to keep ferritin level greater than 50 and resolution of anemia

## 2020-11-29 NOTE — Progress Notes (Signed)
[facility] OFFICE PROGRESS NOTE  Panosh, Standley Brooking, MD  ASSESSMENT & PLAN:  Iron deficiency anemia due to chronic blood loss Unfortunately, she continues to have significant GI bleed Concerns me that the patient did not call We will proceed with IV iron and blood transfusion I will reach out to GI service to perform endoscopy evaluation as soon as possible  We discussed some of the risks, benefits, and alternatives of blood transfusions. The patient is symptomatic from anemia and the hemoglobin level is critically low.  Some of the side-effects to be expected including risks of transfusion reactions, chills, infection, syndrome of volume overload and risk of hospitalization from various reasons and the patient is willing to proceed and went ahead to sign consent today.  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 We discussed some of the risks, benefits, and alternatives of intravenous iron infusions. The patient is symptomatic from anemia and the iron level is critically low. She tolerated oral iron supplement poorly and desires to achieved higher levels of iron faster for adequate hematopoesis. Some of the side-effects to be expected including risks of infusion reactions, phlebitis, headaches, nausea and fatigue.  The patient is willing to proceed. Patient education material was dispensed.  Goal is to keep ferritin level greater than 50 and resolution of anemia   Orders Placed This Encounter  Procedures   Informed Consent Details: Physician/Practitioner Attestation; Transcribe to consent form and obtain patient signature    Standing Status:   Future    Standing Expiration Date:   11/27/2021    Order Specific Question:   Physician/Practitioner attestation of informed consent for blood and or blood product transfusion    Answer:   I, the physician/practitioner, attest that I have discussed with the patient the benefits, risks, side  effects, alternatives, likelihood of achieving goals and potential problems during recovery for the procedure that I have provided informed consent.    Order Specific Question:   Product(s)    Answer:   All Product(s)   Care order/instruction    Transfuse Parameters    Standing Status:   Future    Standing Expiration Date:   11/27/2021   Type and screen         Standing Status:   Future    Number of Occurrences:   1    Standing Expiration Date:   11/27/2021   Prepare RBC (crossmatch)    Standing Status:   Standing    Number of Occurrences:   1    Order Specific Question:   # of Units    Answer:   1 unit    Order Specific Question:   Transfusion Indications    Answer:   Symptomatic Anemia    Order Specific Question:   Number of Units to Keep Ahead    Answer:   NO units ahead    Order Specific Question:   Instructions:    Answer:   Transfuse    Order Specific Question:   If emergent release call blood bank    Answer:   Not emergent release    The total time spent in the appointment was 20 minutes encounter with patients including review of chart and various tests results, discussions about plan of care and coordination of care plan   All questions were answered. The patient knows to call the clinic with any problems, questions or concerns. No barriers to learning was detected.    [name], MD  8/7/202212:23 PM  INTERVAL HISTORY: CLOEY Harper 80 y.o. female returns for further follow-up on chronic iron deficiency anemia Since last time I saw her, she has noted some intermittent dark stools She complained of excessive fatigue and shortness of breath but denies chest pain She denies hematuria or epistaxis.  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 Summary of her history is as follows: This is a pleasant woman with multifactorial anemia and associated leukopenia. 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 The chronic anemia and leukopenia go back as far as we have records (2007) when white counts ran as low as 2700. She has a normal differential. White count average is 3000 and has not changed. Platelet count is normal. Best hemoglobin she achieves with parenteral iron replacement is 11 g. She had a nondiagnostic bone marrow biopsy done in 2006. She had received intravenous iron infusion in the past. Her last colonoscopy in May 2013 showed severe diverticulosis with internal hemorrhoids. She feels well. Denies recent infection. She denies symptoms of fatigue. She tolerated iron supplement well. She had periodic hemorrhoidal bleeding. She was found to have abnormal CBC from intermittently over the years by her primary care doctor She has started taking oral iron supplements since November of last year Most recently, she have noted passage of dark stool She attempted to increase her oral iron supplement but still complain of excessive fatigue Recently, her repeat CBC has dropped from 11-8.2 and hence she was referred back here for further evaluation  She denies recent chest pain on exertion, shortness of breath on minimal exertion, pre-syncopal episodes, or palpitations.  The patient denies over the counter NSAID ingestion. She is not on antiplatelets agents. Her last colonoscopy was in 2013 She had no prior history or diagnosis of cancer. Her age appropriate screening programs are up-to-date. She denies any pica and eats a variety of diet.  The patient was prescribed oral iron supplements and she takes 1 dose of oral iron supplement at night to avoid constipation From Sept 2021 till present, she has received many doses of  intravenous iron infusion   I have reviewed the past medical history, past surgical history, social history and family history with the patient and they are unchanged from previous note.  ALLERGIES:  is allergic to sulfamethoxazole, nsaids, and sulfa antibiotics.  MEDICATIONS:  Current Outpatient Medications  Medication Sig Dispense Refill   amLODipine (NORVASC) 5 MG tablet Take 1 tablet (5 mg total) by mouth daily. 90 tablet 3   Calcium Citrate-Vitamin D (CITRACAL + D PO) Take 1 tablet by mouth daily with lunch. 500 mg calcium (2 X 250 mg)     Cholecalciferol (VITAMIN D3 PO) Take by mouth.     Magnesium 500 MG TABS Take 500 mg by mouth daily with lunch.      Menatetrenone (VITAMIN K2) 100 MCG TABS Take 100 mcg by mouth daily.     metoprolol succinate (TOPROL-XL) 25 MG 24 hr tablet Take 0.5 tablets (12.5 mg total) by mouth at bedtime. 45 tablet 3   Multiple Vitamin (MULTIVITAMIN WITH MINERALS) TABS tablet Take 1 tablet by mouth daily. One a Day - Women's     Oral  Electrolytes (EMERGEN-C ELECTRO MIX PO) Take 1,000 mg by mouth daily. 1 packet daily     No current facility-administered medications for this visit.     REVIEW OF SYSTEMS:   Constitutional: Denies fevers, chills or night sweats Eyes: Denies blurriness of vision Ears, nose, mouth, throat, and face: Denies mucositis or sore throat Respiratory: Denies cough, dyspnea or wheezes Cardiovascular: Denies palpitation, chest discomfort or lower extremity swelling Gastrointestinal:  Denies nausea, heartburn or change in bowel habits Skin: Denies abnormal skin rashes Lymphatics: Denies new lymphadenopathy or easy bruising Neurological:Denies numbness, tingling or new weaknesses Behavioral/Psych: Mood is stable, no new changes  All other systems were reviewed with the patient and are negative.  PHYSICAL EXAMINATION: ECOG PERFORMANCE STATUS: 1 - Symptomatic but completely ambulatory  Vitals:   11/27/20 1016  BP: (!) 127/50   Pulse: 70  Resp: 18  Temp: 97.7 F (36.5 C)  SpO2: 99%   Filed Weights   11/27/20 1016  Weight: 155 lb 12.8 oz (70.7 kg)    GENERAL:alert, no distress and comfortable.  She looks pale NEURO: alert & oriented x 3 with fluent speech, no focal motor/sensory deficits  LABORATORY DATA:  I have reviewed the data as listed     Component Value Date/Time   NA 143 11/05/2020 1211   NA 142 05/28/2012 1600   K 4.1 11/05/2020 1211   K 3.7 05/28/2012 1600   CL 105 11/05/2020 1211   CL 103 05/28/2012 1600   CO2 29 11/05/2020 1211   CO2 29 05/28/2012 1600   GLUCOSE 72 11/05/2020 1211   GLUCOSE 99 05/28/2012 1600   BUN 20 11/05/2020 1211   BUN 19.3 05/28/2012 1600   CREATININE 0.91 11/05/2020 1211   CREATININE 0.88 12/06/2019 1147   CREATININE 0.8 05/28/2012 1600   CALCIUM 9.2 11/05/2020 1211   CALCIUM 9.5 05/28/2012 1600   PROT 7.1 12/06/2019 1147   PROT 7.3 05/28/2012 1600   ALBUMIN 4.4 11/21/2018 1215   ALBUMIN 3.5 05/28/2012 1600   AST 21 12/06/2019 1147   AST 23 05/28/2012 1600   ALT 13 12/06/2019 1147   ALT 13 05/28/2012 1600   ALKPHOS 86 11/21/2018 1215   ALKPHOS 82 05/28/2012 1600   BILITOT 0.4 12/06/2019 1147   BILITOT <0.20 05/28/2012 1600   GFRNONAA >60 11/05/2020 1211   GFRAA >60 04/23/2018 1634    No results found for: SPEP, UPEP  Lab Results  Component Value Date   WBC 3.9 (L) 11/27/2020   NEUTROABS 2.4 11/27/2020   HGB 7.0 (L) 11/27/2020   HCT 22.6 (L) 11/27/2020   MCV 89.0 11/27/2020   PLT 196 11/27/2020      Chemistry      Component Value Date/Time   NA 143 11/05/2020 1211   NA 142 05/28/2012 1600   K 4.1 11/05/2020 1211   K 3.7 05/28/2012 1600   CL 105 11/05/2020 1211   CL 103 05/28/2012 1600   CO2 29 11/05/2020 1211   CO2 29 05/28/2012 1600   BUN 20 11/05/2020 1211   BUN 19.3 05/28/2012 1600   CREATININE 0.91 11/05/2020 1211   CREATININE 0.88 12/06/2019 1147   CREATININE 0.8 05/28/2012 1600      Component Value Date/Time   CALCIUM  9.2 11/05/2020 1211   CALCIUM 9.5 05/28/2012 1600   ALKPHOS 86 11/21/2018 1215   ALKPHOS 82 05/28/2012 1600   AST 21 12/06/2019 1147   AST 23 05/28/2012 1600   ALT 13 12/06/2019 1147   ALT 13 05/28/2012 1600  BILITOT 0.4 12/06/2019 1147   BILITOT <0.20 05/28/2012 1600

## 2020-12-01 ENCOUNTER — Telehealth: Payer: Self-pay

## 2020-12-01 NOTE — Telephone Encounter (Signed)
-----   Message from Irene Shipper, MD sent at 12/01/2020  9:38 AM EDT ----- Advanced practitioner is fine.   .  Thanks ----- Message ----- From: Greggory Keen, LPN Sent: [phone]   2:23 PM EDT To: Irene Shipper, MD  Dr Henrene Pastor, There are no openings on Dr Woodward Ku schedule. Would it be appropriate to have her seen by an APP? She could possibly be seen this week. Thanks Beth ----- Message ----- From: Algernon Huxley, RN Sent: 11/30/2020   9:42 AM EDT To: Greggory Keen, LPN  Beth please see note below from Dr. Henrene Pastor. I looked at Dr. VN schedule but do not see anything sooner than 8/26. Please help.  ----- Message ----- From: Irene Shipper, MD Sent: 11/30/2020   8:51 AM EDT To: Algernon Huxley, RN, [name], MD, #  Vaughan Basta, Old Dr. Deatra Ina patient.  Looks like Dr. Silverio Decamp patient currently (staffed clinic note with Amy last year).  For some reason had appointment with me in late August.  Please arrange sooner follow-up with Dr. Silverio Decamp per Dr. Alvy Bimler. Thanks JP  ----- Message ----- From: [name], MD Sent: 11/29/2020  12:25 PM EDT To: Irene Shipper, MD  I just saw her on Friday She is losing blood faster than I can replace with IV iron Can you work her in an earlier appt?  Thanks, Ni

## 2020-12-01 NOTE — Telephone Encounter (Signed)
Patient is returning your call.  

## 2020-12-01 NOTE — Telephone Encounter (Signed)
Spoke with the patient. She has multiple appointments. Able to find an appointment to fit her schedule 9 days earlier than previously scheduled. She will come in on 12/09/20 at 1:30 pm.

## 2020-12-01 NOTE — Telephone Encounter (Signed)
Called the patient to arrange an appointment. No answer. Left her a message to call back and ask for me.

## 2020-12-02 ENCOUNTER — Other Ambulatory Visit: Payer: Self-pay

## 2020-12-02 ENCOUNTER — Inpatient Hospital Stay: Payer: Medicare Other

## 2020-12-02 VITALS — BP 130/57 | HR 57 | Temp 98.6°F | Resp 17

## 2020-12-02 DIAGNOSIS — D509 Iron deficiency anemia, unspecified: Secondary | ICD-10-CM | POA: Diagnosis not present

## 2020-12-02 DIAGNOSIS — D500 Iron deficiency anemia secondary to blood loss (chronic): Secondary | ICD-10-CM

## 2020-12-02 MED ORDER — SODIUM CHLORIDE 0.9 % IV SOLN
INTRAVENOUS | Status: DC
  Filled 2020-12-02 (×2): qty 250

## 2020-12-02 MED ORDER — SODIUM CHLORIDE 0.9 % IV SOLN
300.0000 mg | Freq: Once | INTRAVENOUS | Status: AC
  Administered 2020-12-02: 300 mg via INTRAVENOUS
  Filled 2020-12-02: qty 300

## 2020-12-02 NOTE — Patient Instructions (Signed)

## 2020-12-04 ENCOUNTER — Other Ambulatory Visit: Payer: Self-pay

## 2020-12-04 ENCOUNTER — Inpatient Hospital Stay: Payer: Medicare Other

## 2020-12-04 VITALS — BP 128/60 | HR 50 | Temp 98.9°F | Resp 17

## 2020-12-04 DIAGNOSIS — D509 Iron deficiency anemia, unspecified: Secondary | ICD-10-CM | POA: Diagnosis not present

## 2020-12-04 DIAGNOSIS — D500 Iron deficiency anemia secondary to blood loss (chronic): Secondary | ICD-10-CM

## 2020-12-04 MED ORDER — SODIUM CHLORIDE 0.9 % IV SOLN
300.0000 mg | Freq: Once | INTRAVENOUS | Status: AC
  Administered 2020-12-04: 300 mg via INTRAVENOUS
  Filled 2020-12-04: qty 300

## 2020-12-04 MED ORDER — SODIUM CHLORIDE 0.9 % IV SOLN
Freq: Once | INTRAVENOUS | Status: AC
  Filled 2020-12-04: qty 250

## 2020-12-04 NOTE — Patient Instructions (Signed)

## 2020-12-07 ENCOUNTER — Inpatient Hospital Stay: Payer: Medicare Other

## 2020-12-07 ENCOUNTER — Other Ambulatory Visit: Payer: Self-pay

## 2020-12-07 VITALS — BP 121/66 | HR 50 | Temp 98.4°F | Resp 16

## 2020-12-07 DIAGNOSIS — D509 Iron deficiency anemia, unspecified: Secondary | ICD-10-CM | POA: Diagnosis not present

## 2020-12-07 DIAGNOSIS — D500 Iron deficiency anemia secondary to blood loss (chronic): Secondary | ICD-10-CM

## 2020-12-07 MED ORDER — SODIUM CHLORIDE 0.9 % IV SOLN: INTRAVENOUS | Status: DC

## 2020-12-07 MED ORDER — SODIUM CHLORIDE 0.9 % IV SOLN
300.0000 mg | Freq: Once | INTRAVENOUS | Status: AC
  Administered 2020-12-07: 300 mg via INTRAVENOUS
  Filled 2020-12-07: qty 300

## 2020-12-07 NOTE — Patient Instructions (Signed)

## 2020-12-07 NOTE — Progress Notes (Signed)
Chief Complaint  Patient presents with   Annual Exam     HPI: Belinda Harper 80 y.o. comes in today for Preventive Medicare exam/ wellness visit medicine check. She is recently had transfusion infusion regarding drop in her hemoglobin still felt to be iron deficiency and she has an appointment with GI tomorrow for reevaluation. Her hemoglobin did drop to the 6 7 range. She still feels tired.  She does have intermittent bleeding hemorrhoids otherwise no history.  Her history of iron deficiency anemia severe dates back over 10 years when she first presented with a low hemoglobin that required a transfusion.  Blood pressure has been good no syncope orthostasis Some arthritis symptoms toes feel like a band but no progression. She knows she has osteoporosis but is read on medications and has concern about potential side effects but willing to revisit. Has had 2 COVID vaccines not interested in boosters because not convinced of benefit over risk. Health Maintenance  Topic Date Due   Hepatitis C Screening  Never done   TETANUS/TDAP  06/23/2019   COVID-19 Vaccine (3 - Pfizer risk series) 04/01/2020   INFLUENZA VACCINE  12/24/2020 (Originally 11/23/2020)   DEXA SCAN  Completed   PNA vac Low Risk Adult  Completed   Zoster Vaccines- Shingrix  Completed   HPV VACCINES  Aged Out   Health Maintenance Review LIFESTYLE:  Exercise:  walking  when can hg is low  and less  withjanemia.  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 Tobacco/ETS: n Alcohol: n Sugar beverages: only when out limited  Sleep: 6-7  Drug use: no HH: 5   2 d and 2 cats    Hearing: little down  Vision:  No limitations at present . Last eye check upcoming glasses Safety:  Has smoke detector and wears seat belts. No excess sun exposure. Sees dentist regularly. Memory: Felt to be good  , no concern from her or her family. Not as good a used to be  Depression: No anhedonia unusual crying or depressive  symptoms Nutrition: Eats well balanced diet; adequate calcium and vitamin D. No swallowing chewing problems. Injury: no major injuries in the last six months. Other healthcare providers:  Reviewed today . Preventive parameters:  Reviewed  ADLS:   There are no problems or need for assistance  driving, feeding, obtaining food, dressing, toileting and bathing, managing money using phone. She is independent.   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   ALLERGIC RHINITIS 01/03/2007   ANEMIA-IRON DEFICIENCY 01/03/2007   Anxiety    ASTHMA 01/03/2007   Blood transfusion 12 2010   hg 4 transfused 4 units   DEPRESSION 01/03/2007   resolved   Family history of adverse reaction to anesthesia    pt's mother as she gets older organs shut down    FRACTURE, ANKLE, LEFT 04/10/2009   Gastritis 2002   GERD (gastroesophageal reflux disease)    HEMORRHOIDS 05/19/2008   HYPERCHOLESTEROLEMIA 05/19/2008   Normal cardiac stress test    2014   OSTEOARTHRITIS 01/03/2007   OSTEOPOROSIS 01/03/2007   hx of actonbel use for at least 5 years    Sepsis due to cellulitis ([facility]) 02/05/2015   Unspecified vitamin D deficiency 04/06/2007   UTERINE PROLAPSE 04/06/2007    Family History  Problem Relation Age of Onset   Hyperlipidemia Mother  Heart disease Mother        CABG age 49   Ovarian cancer Mother    Uterine cancer Mother    Arthritis Mother    Osteoporosis Mother    Heart disease Father        CABG age 96   Arthritis Father    Melanoma Father     Social History   Socioeconomic History   Marital status: Divorced    Spouse name: Not on file   Number of children: 2   Years of education: Not on file   Highest education level: Not on file  Occupational History   Occupation: retired  Tobacco Use   Smoking status: Never   Smokeless tobacco: Never  Vaping Use   Vaping Use: Never used  Substance  and Sexual Activity   Alcohol use: Not Currently   Drug use: No   Sexual activity: Not Currently    Birth control/protection: Post-menopausal    Comment: 1st intercourse- 18, partners- 1   Other Topics Concern   Not on file  Social History Narrative   HH of  1   To rent out room    Retired Dispensing optician   Never smoked    Pet cat allergic    Divorced   Regular exericise  walking mowing the lawnswimming   Silver sneakers    Social Determinants of Health   Financial Resource Strain: Not on file  Food Insecurity: Not on file  Transportation Needs: Not on file  Physical Activity: Not on file  Stress: Not on file  Social Connections: Not on file    Outpatient Encounter Medications as of 12/08/2020  Medication Sig   amLODipine (NORVASC) 5 MG tablet Take 1 tablet (5 mg total) by mouth daily.   Calcium Citrate-Vitamin D (CITRACAL + D PO) Take 1 tablet by mouth daily with lunch. 500 mg calcium (2 X 250 mg)   Cholecalciferol (VITAMIN D3 PO) Take by mouth.   Magnesium 500 MG TABS Take 500 mg by mouth daily with lunch.    Menatetrenone (VITAMIN K2) 100 MCG TABS Take 100 mcg by mouth daily.   metoprolol succinate (TOPROL-XL) 25 MG 24 hr tablet Take 0.5 tablets (12.5 mg total) by mouth at bedtime.   Multiple Vitamin (MULTIVITAMIN WITH MINERALS) TABS tablet Take 1 tablet by mouth daily. One a Day - Women's   vitamin C (ASCORBIC ACID) 500 MG tablet Take 500 mg by mouth daily.   [DISCONTINUED] 0.9 %  sodium chloride infusion    No facility-administered encounter medications on file as of 12/08/2020.    EXAM:  BP (!) 146/70 (BP Location: Left Arm, Patient Position: Sitting, Cuff Size: Normal)   Pulse (!) 52   Temp 98.5 F (36.9 C) (Oral)   Ht '5\' 7"'$  (1.702 m)   Wt 156 lb (70.8 kg)   SpO2 98%   BMI 24.43 kg/m   Body mass index is 24.43 kg/m.  Physical Exam: Vital signs reviewed [id number] is a well-developed well-nourished alert cooperative   who appears stated age in no acute  distress.  HEENT: normocephalic atraumatic , Eyes: PERRL EOM's full, conjunctiva clear, : ., Ears: no deformity EAC's clear TMs with normal landmarks. Mouth: masked  NECK: supple without masses, thyromegaly or bruits. CHEST/PULM:  Clear to auscultation and percussion breath sounds equal no wheeze , rales or rhonchi. No chest wall deformities or tenderness. CV: PMI is nondisplaced, S1 S2 no gallops, faint systolic m non radiating  lusb, rubs. Peripheral pulses are full  without delay.No JVD .  ABDOMEN: Bowel sounds normal nontender  No guard or rebound, no hepato splenomegal no CVA tenderness.   Extremtities:  No clubbing cyanosis or edema, no acute joint swelling or redness no focal atrophy arthritis changes feet  old surg scar left ankle  NEURO:  Oriented x3, cranial nerves 3-12 appear to be intact, no obvious focal weakness,gait within normal limits no abnormal reflexes or asymmetrical SKIN: No acute rashes normal turgor, color, no bruising or petechiae. PSYCH: Oriented, good eye contact, no obvious depression anxiety, cognition and judgment appear normal. LN: no cervical axillary inguinal adenopathy No noted deficits in memory, attention, and speech.   Lab Results  Component Value Date   WBC 3.9 (L) 11/27/2020   HGB 7.0 (L) 11/27/2020   HCT 22.6 (L) 11/27/2020   PLT 196 11/27/2020   GLUCOSE 72 11/05/2020   CHOL 215 (H) 12/06/2019   TRIG 138 12/06/2019   HDL 55 12/06/2019   LDLDIRECT 141.9 08/06/2012   LDLCALC 133 (H) 12/06/2019   ALT 13 12/06/2019   AST 21 12/06/2019   NA 143 11/05/2020   K 4.1 11/05/2020   CL 105 11/05/2020   CREATININE 0.91 11/05/2020   BUN 20 11/05/2020   CO2 29 11/05/2020   TSH 1.00 12/06/2019   INR 1.04 04/10/2009   HGBA1C 5.6 11/21/2018    ASSESSMENT AND PLAN:  Discussed the following assessment and plan:  Visit for preventive health examination  Hypertension, unspecified type - Plan: Hepatic function panel, Lipid panel, TSH, Basic metabolic  panel  Iron deficiency anemia, unspecified iron deficiency anemia type - Plan: Hepatic function panel, Lipid panel, TSH, Basic metabolic panel, Celiac Disease Comprehensive Panel with Reflexes  Medication management - Plan: Hepatic function panel, Lipid panel, TSH, Basic metabolic panel  Iron deficiency anemia due to chronic blood loss - Plan: Hepatic function panel, Lipid panel, TSH, Basic metabolic panel, Celiac Disease Comprehensive Panel with Reflexes  Hyperlipidemia, unspecified hyperlipidemia type - Plan: Hepatic function panel, Lipid panel, TSH, Basic metabolic panel Will order updated lab not related to anemia to get fasting tomorrow morning when she sees GI.  At the [facility] office. Visit bone health in the future. Patient Care Team: Donnie Gedeon, Standley Brooking, MD as PCP - General [place], Nadean Corwin, MD as PCP - Cardiology (Cardiology) Servando Salina, MD (Obstetrics and Gynecology) Clent Jacks, MD (Ophthalmology) Mikey Bussing, DDS (Dentistry) Druscilla Brownie, MD as Consulting Physician (Dermatology) [name], MD as Consulting Physician (Hematology and Oncology) Gaynelle Arabian, MD as Consulting Physician (Orthopedic Surgery)  Patient Instructions  Good to see you today. Will place lab orders for the [facility] lab   for tomorrow.   Will reconsider   bone  building therapy.   Health Maintenance, Female Adopting a healthy lifestyle and getting preventive care are important in promoting health and wellness. Ask your health care provider about: The right schedule for you to have regular tests and exams. Things you can do on your own to prevent diseases and keep yourself healthy. What should I know about diet, weight, and exercise? Eat a healthy diet  Eat a diet that includes plenty of vegetables, fruits, low-fat dairy products, and lean protein. Do not eat a lot of foods that are high in solid fats, added sugars, or sodium.  Maintain a healthy weight Body mass index (BMI) is used to  identify weight problems. It estimates body fat based on height and weight. Your health care provider can help determineyour BMI and help you achieve or maintain a  healthy weight. Get regular exercise Get regular exercise. This is one of the most important things you can do for your health. Most adults should: Exercise for at least 150 minutes each week. The exercise should increase your heart rate and make you sweat (moderate-intensity exercise). Do strengthening exercises at least twice a week. This is in addition to the moderate-intensity exercise. Spend less time sitting. Even light physical activity can be beneficial. Watch cholesterol and blood lipids Have your blood tested for lipids and cholesterol at 79 years of age, then havethis test every 5 years. Have your cholesterol levels checked more often if: Your lipid or cholesterol levels are high. You are older than 79 years of age. You are at high risk for heart disease. What should I know about cancer screening? Depending on your health history and family history, you may need to have cancer screening at various ages. This may include screening for: Breast cancer. Cervical cancer. Colorectal cancer. Skin cancer. Lung cancer. What should I know about heart disease, diabetes, and high blood pressure? Blood pressure and heart disease High blood pressure causes heart disease and increases the risk of stroke. This is more likely to develop in people who have high blood pressure readings, are of African descent, or are overweight. Have your blood pressure checked: Every 3-5 years if you are 11-46 years of age. Every year if you are 61 years old or older. Diabetes Have regular diabetes screenings. This checks your fasting blood sugar level. Have the screening done: Once every three years after age 69 if you are at a normal weight and have a low risk for diabetes. More often and at a younger age if you are overweight or have a high risk  for diabetes. What should I know about preventing infection? Hepatitis B If you have a higher risk for hepatitis B, you should be screened for this virus. Talk with your health care provider to find out if you are at risk forhepatitis B infection. Hepatitis C Testing is recommended for: Everyone born from [birth year] through 1964. Anyone with known risk factors for hepatitis C. Sexually transmitted infections (STIs) Get screened for STIs, including gonorrhea and chlamydia, if: You are sexually active and are younger than 79 years of age. You are older than 79 years of age and your health care provider tells you that you are at risk for this type of infection. Your sexual activity has changed since you were last screened, and you are at increased risk for chlamydia or gonorrhea. Ask your health care provider if you are at risk. Ask your health care provider about whether you are at high risk for HIV. Your health care provider may recommend a prescription medicine to help prevent HIV infection. If you choose to take medicine to prevent HIV, you should first get tested for HIV. You should then be tested every 3 months for as long as you are taking the medicine. Pregnancy If you are about to stop having your period (premenopausal) and you may become pregnant, seek counseling before you get pregnant. Take 400 to 800 micrograms (mcg) of folic acid every day if you become pregnant. Ask for birth control (contraception) if you want to prevent pregnancy. Osteoporosis and menopause Osteoporosis is a disease in which the bones lose minerals and strength with aging. This can result in bone fractures. If you are 67 years old or older, or if you are at risk for osteoporosis and fractures, ask your health care provider if you should: Be  screened for bone loss. Take a calcium or vitamin D supplement to lower your risk of fractures. Be given hormone replacement therapy (HRT) to treat symptoms of menopause. Follow  these instructions at home: Lifestyle Do not use any products that contain nicotine or tobacco, such as cigarettes, e-cigarettes, and chewing tobacco. If you need help quitting, ask your health care provider. Do not use street drugs. Do not share needles. Ask your health care provider for help if you need support or information about quitting drugs. Alcohol use Do not drink alcohol if: Your health care provider tells you not to drink. You are pregnant, may be pregnant, or are planning to become pregnant. If you drink alcohol: Limit how much you use to 0-1 drink a day. Limit intake if you are breastfeeding. Be aware of how much alcohol is in your drink. In the [place] one drink equals one 12 oz bottle of beer (355 mL), one 5 oz glass of wine (148 mL), or one 1 oz glass of hard liquor (44 mL). General instructions Schedule regular health, dental, and eye exams. Stay current with your vaccines. Tell your health care provider if: You often feel depressed. You have ever been abused or do not feel safe at home. Summary Adopting a healthy lifestyle and getting preventive care are important in promoting health and wellness. Follow your health care provider's instructions about healthy diet, exercising, and getting tested or screened for diseases. Follow your health care provider's instructions on monitoring your cholesterol and blood pressure. This information is not intended to replace advice given to you by your health care provider. Make sure you discuss any questions you have with your healthcare provider. Document Revised: 04/04/2018 Document Reviewed: 04/04/2018 Elsevier Patient Education  2022 [employer]. Vu Liebman M.D.

## 2020-12-08 ENCOUNTER — Encounter: Payer: Self-pay | Admitting: Internal Medicine

## 2020-12-08 ENCOUNTER — Ambulatory Visit (INDEPENDENT_AMBULATORY_CARE_PROVIDER_SITE_OTHER): Payer: Medicare Other | Admitting: Internal Medicine

## 2020-12-08 VITALS — BP 146/70 | HR 52 | Temp 98.5°F | Ht 67.0 in | Wt 156.0 lb

## 2020-12-08 DIAGNOSIS — D509 Iron deficiency anemia, unspecified: Secondary | ICD-10-CM | POA: Diagnosis not present

## 2020-12-08 DIAGNOSIS — Z79899 Other long term (current) drug therapy: Secondary | ICD-10-CM

## 2020-12-08 DIAGNOSIS — D500 Iron deficiency anemia secondary to blood loss (chronic): Secondary | ICD-10-CM | POA: Diagnosis not present

## 2020-12-08 DIAGNOSIS — I10 Essential (primary) hypertension: Secondary | ICD-10-CM

## 2020-12-08 DIAGNOSIS — Z0000 Encounter for general adult medical examination without abnormal findings: Secondary | ICD-10-CM

## 2020-12-08 DIAGNOSIS — E785 Hyperlipidemia, unspecified: Secondary | ICD-10-CM

## 2020-12-08 NOTE — Patient Instructions (Signed)
Good to see you today. Will place lab orders for the [facility] lab   for tomorrow.   Will reconsider   bone  building therapy.   Health Maintenance, Female Adopting a healthy lifestyle and getting preventive care are important in promoting health and wellness. Ask your health care provider about: The right schedule for you to have regular tests and exams. Things you can do on your own to prevent diseases and keep yourself healthy. What should I know about diet, weight, and exercise? Eat a healthy diet  Eat a diet that includes plenty of vegetables, fruits, low-fat dairy products, and lean protein. Do not eat a lot of foods that are high in solid fats, added sugars, or sodium.  Maintain a healthy weight Body mass index (BMI) is used to identify weight problems. It estimates body fat based on height and weight. Your health care provider can help determineyour BMI and help you achieve or maintain a healthy weight. Get regular exercise Get regular exercise. This is one of the most important things you can do for your health. Most adults should: Exercise for at least 150 minutes each week. The exercise should increase your heart rate and make you sweat (moderate-intensity exercise). Do strengthening exercises at least twice a week. This is in addition to the moderate-intensity exercise. Spend less time sitting. Even light physical activity can be beneficial. Watch cholesterol and blood lipids Have your blood tested for lipids and cholesterol at 79 years of age, then havethis test every 5 years. Have your cholesterol levels checked more often if: Your lipid or cholesterol levels are high. You are older than 79 years of age. You are at high risk for heart disease. What should I know about cancer screening? Depending on your health history and family history, you may need to have cancer screening at various ages. This may include screening for: Breast cancer. Cervical cancer. Colorectal  cancer. Skin cancer. Lung cancer. What should I know about heart disease, diabetes, and high blood pressure? Blood pressure and heart disease High blood pressure causes heart disease and increases the risk of stroke. This is more likely to develop in people who have high blood pressure readings, are of African descent, or are overweight. Have your blood pressure checked: Every 3-5 years if you are 39-55 years of age. Every year if you are 72 years old or older. Diabetes Have regular diabetes screenings. This checks your fasting blood sugar level. Have the screening done: Once every three years after age 54 if you are at a normal weight and have a low risk for diabetes. More often and at a younger age if you are overweight or have a high risk for diabetes. What should I know about preventing infection? Hepatitis B If you have a higher risk for hepatitis B, you should be screened for this virus. Talk with your health care provider to find out if you are at risk forhepatitis B infection. Hepatitis C Testing is recommended for: Everyone born from [birth year] through 1964. Anyone with known risk factors for hepatitis C. Sexually transmitted infections (STIs) Get screened for STIs, including gonorrhea and chlamydia, if: You are sexually active and are younger than 79 years of age. You are older than 79 years of age and your health care provider tells you that you are at risk for this type of infection. Your sexual activity has changed since you were last screened, and you are at increased risk for chlamydia or gonorrhea. Ask your health care provider if  you are at risk. Ask your health care provider about whether you are at high risk for HIV. Your health care provider may recommend a prescription medicine to help prevent HIV infection. If you choose to take medicine to prevent HIV, you should first get tested for HIV. You should then be tested every 3 months for as long as you are taking the  medicine. Pregnancy If you are about to stop having your period (premenopausal) and you may become pregnant, seek counseling before you get pregnant. Take 400 to 800 micrograms (mcg) of folic acid every day if you become pregnant. Ask for birth control (contraception) if you want to prevent pregnancy. Osteoporosis and menopause Osteoporosis is a disease in which the bones lose minerals and strength with aging. This can result in bone fractures. If you are 47 years old or older, or if you are at risk for osteoporosis and fractures, ask your health care provider if you should: Be screened for bone loss. Take a calcium or vitamin D supplement to lower your risk of fractures. Be given hormone replacement therapy (HRT) to treat symptoms of menopause. Follow these instructions at home: Lifestyle Do not use any products that contain nicotine or tobacco, such as cigarettes, e-cigarettes, and chewing tobacco. If you need help quitting, ask your health care provider. Do not use street drugs. Do not share needles. Ask your health care provider for help if you need support or information about quitting drugs. Alcohol use Do not drink alcohol if: Your health care provider tells you not to drink. You are pregnant, may be pregnant, or are planning to become pregnant. If you drink alcohol: Limit how much you use to 0-1 drink a day. Limit intake if you are breastfeeding. Be aware of how much alcohol is in your drink. In the [place] one drink equals one 12 oz bottle of beer (355 mL), one 5 oz glass of wine (148 mL), or one 1 oz glass of hard liquor (44 mL). General instructions Schedule regular health, dental, and eye exams. Stay current with your vaccines. Tell your health care provider if: You often feel depressed. You have ever been abused or do not feel safe at home. Summary Adopting a healthy lifestyle and getting preventive care are important in promoting health and wellness. Follow your health  care provider's instructions about healthy diet, exercising, and getting tested or screened for diseases. Follow your health care provider's instructions on monitoring your cholesterol and blood pressure. This information is not intended to replace advice given to you by your health care provider. Make sure you discuss any questions you have with your healthcare provider. Document Revised: 04/04/2018 Document Reviewed: 04/04/2018 Elsevier Patient Education  2022 [employer].

## 2020-12-09 ENCOUNTER — Ambulatory Visit (INDEPENDENT_AMBULATORY_CARE_PROVIDER_SITE_OTHER): Payer: Medicare Other | Admitting: Gastroenterology

## 2020-12-09 ENCOUNTER — Encounter: Payer: Self-pay | Admitting: Gastroenterology

## 2020-12-09 ENCOUNTER — Other Ambulatory Visit (INDEPENDENT_AMBULATORY_CARE_PROVIDER_SITE_OTHER): Payer: Medicare Other

## 2020-12-09 VITALS — BP 122/70 | HR 65 | Ht 67.0 in | Wt 153.2 lb

## 2020-12-09 DIAGNOSIS — E785 Hyperlipidemia, unspecified: Secondary | ICD-10-CM

## 2020-12-09 DIAGNOSIS — D509 Iron deficiency anemia, unspecified: Secondary | ICD-10-CM

## 2020-12-09 DIAGNOSIS — I10 Essential (primary) hypertension: Secondary | ICD-10-CM | POA: Diagnosis not present

## 2020-12-09 DIAGNOSIS — D500 Iron deficiency anemia secondary to blood loss (chronic): Secondary | ICD-10-CM

## 2020-12-09 DIAGNOSIS — Z79899 Other long term (current) drug therapy: Secondary | ICD-10-CM

## 2020-12-09 LAB — LIPID PANEL
Cholesterol: 227 mg/dL — ABNORMAL HIGH (ref 0–200)
HDL: 79 mg/dL (ref >39.00)
LDL Cholesterol: 124 mg/dL — ABNORMAL HIGH (ref 0–99)
NonHDL: 147.6
Total CHOL/HDL Ratio: 3
Triglycerides: 118 mg/dL (ref 0.0–149.0)
VLDL: 23.6 mg/dL (ref 0.0–40.0)

## 2020-12-09 LAB — BASIC METABOLIC PANEL
BUN: 12 mg/dL (ref 6–23)
CO2: 31 mEq/L (ref 19–32)
Calcium: 9.6 mg/dL (ref 8.4–10.5)
Chloride: 103 mEq/L (ref 96–112)
Creatinine, Ser: 0.89 mg/dL (ref 0.40–1.20)
GFR: 61.91 mL/min (ref >60.00)
Glucose, Bld: 79 mg/dL (ref 70–99)
Potassium: 4.1 mEq/L (ref 3.5–5.1)
Sodium: 140 mEq/L (ref 135–145)

## 2020-12-09 LAB — HEPATIC FUNCTION PANEL
ALT: 15 U/L (ref 0–35)
AST: 25 U/L (ref 0–37)
Albumin: 4.4 g/dL (ref 3.5–5.2)
Alkaline Phosphatase: 79 U/L (ref 39–117)
Bilirubin, Direct: 0.1 mg/dL (ref 0.0–0.3)
Total Bilirubin: 0.3 mg/dL (ref 0.2–1.2)
Total Protein: 7.8 g/dL (ref 6.0–8.3)

## 2020-12-09 LAB — TSH: TSH: 1.19 u[IU]/mL (ref 0.35–5.50)

## 2020-12-09 NOTE — Progress Notes (Signed)
12/09/2020 UNKNOWN FLANNIGAN [id number] 01-21-1941   HISTORY OF PRESENT ILLNESS: This is a pleasant 80 year old female whose care has been established Dr. Silverio Decamp.  She is here today for evaluation of iron deficiency anemia.  This is been an issue for her on and off for the past 30 years or more.  She was evaluated extensively with EGD, colonoscopy, capsule endoscopy, etc. in the early 2000's.  Last colonoscopy was in 2013.  This has been an issue again over the past several months.  She has received 2 units of packed red blood cells and several iron infusions.  Her last hemoglobin in July was 7.0 g at which time she was transfused with a unit of blood.  She has repeat labs and more iron infusions scheduled for the end of this month.  She tells me that she is no longer on oral iron supplements as she had been on several over the years and they all seem to stop working after some time.  She has even had a bone marrow biopsy in the past.  She denies seeing any red blood in her stools, sees occasional very small dots of bright red blood on the toilet paper or in her undergarments that she attributes to hemorrhoids.  A few weeks ago she did have a couple of weeks where her stools were very dark greenish color.  The last couple weeks they have been back to normal.  They were not thick and black and tarry, however.  No other associated GI complaints.   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   ALLERGIC RHINITIS 01/03/2007   ANEMIA-IRON DEFICIENCY 01/03/2007   Anxiety    ASTHMA 01/03/2007   Blood transfusion 12 2010   hg 4 transfused 4 units   DEPRESSION 01/03/2007   resolved   Family history of adverse reaction to anesthesia    pt's mother as she gets older organs shut down    FRACTURE, ANKLE, LEFT 04/10/2009   Gastritis 2002   GERD (gastroesophageal reflux disease)    HEMORRHOIDS 05/19/2008   HYPERCHOLESTEROLEMIA 05/19/2008   Normal cardiac  stress test    2014   OSTEOARTHRITIS 01/03/2007   OSTEOPOROSIS 01/03/2007   hx of actonbel use for at least 5 years    Sepsis due to cellulitis ([place]) 02/05/2015   Unspecified vitamin D deficiency 04/06/2007   UTERINE PROLAPSE 04/06/2007   Past Surgical History:  Procedure Laterality Date   ANKLE FRACTURE SURGERY  02/2009   left ankle fracture and dislocation   CATARACT EXTRACTION Left    last year    CATARACT EXTRACTION W/ INTRAOCULAR LENS IMPLANT  2000   right eye   HARDWARE REMOVAL Left 02/21/2015   Procedure: IRRIGATION AND DEBRIDEMENT HARDWARE REMOVAL LEFT ANKLE ;  Surgeon: Gaynelle Arabian, MD;  Location: [facility];  Service: Orthopedics;  Laterality: Left;   MASS EXCISION Left 09/11/2012   Procedure: 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 She has never used smokeless tobacco. She reports that she does not currently use alcohol. She reports that she does not use drugs. family history includes Arthritis in her father and mother; Heart disease  in her father and mother; Hyperlipidemia in her mother; Melanoma in her father; Osteoporosis in her mother; Ovarian cancer in her mother; Uterine cancer in her mother. Allergies  Allergen Reactions   Sulfamethoxazole Rash   Nsaids Other (See Comments)    Drops hemoglobin levels   Sulfa Antibiotics Rash      Outpatient Encounter Medications as of 12/09/2020  Medication Sig   amLODipine (NORVASC) 5 MG tablet Take 1 tablet (5 mg total) by mouth daily.   Calcium Citrate-Vitamin D (CITRACAL + D PO) Take 1 tablet by mouth daily with lunch. 500 mg calcium (2 X 250 mg)   Cholecalciferol (VITAMIN D3 PO) Take by mouth.   Magnesium 500 MG TABS Take 500 mg by mouth daily with lunch.     Menatetrenone (VITAMIN K2) 100 MCG TABS Take 100 mcg by mouth daily.   metoprolol succinate (TOPROL-XL) 25 MG 24 hr tablet Take 0.5 tablets (12.5 mg total) by mouth at bedtime.   Multiple Vitamin (MULTIVITAMIN WITH MINERALS) TABS tablet Take 1 tablet by mouth daily. One a Day - Women's   vitamin C (ASCORBIC ACID) 250 MG tablet Take 1 mg by mouth daily.   vitamin C (ASCORBIC ACID) 500 MG tablet Take 500 mg by mouth daily.   No facility-administered encounter medications on file as of 12/09/2020.     REVIEW OF SYSTEMS  : All other systems reviewed and negative except where noted in the History of Present Illness.   PHYSICAL EXAM: BP 122/70   Pulse 65   Ht _0  (1.702 m)   Wt 153 lb 4 oz (69.5 kg)   BMI 24.00 kg/m  General: Well developed white female in no acute distress Head: Normocephalic and atraumatic Eyes:  Sclerae anicteric, conjunctiva pink. Ears: Normal auditory acuity Lungs: Clear throughout to auscultation; no W/R/R. Heart: Regular rate and rhythm; no M/R/G. Abdomen: Soft, non-distended.  BS present.  Non-tender. Rectal:  Will be done at the time of colonoscopy. Musculoskeletal: Symmetrical with no gross deformities  Skin: No lesions on visible extremities Extremities: No edema  Neurological: Alert oriented x 4, grossly non-focal Psychological:  Alert and cooperative. Normal mood and affect  ASSESSMENT AND PLAN: *Iron deficiency anemia: This has been an issue on and off for over 30 years.  Was evaluated extensively in the early 2000's with no GI source identified.  Also had bone marrow biopsy.  This has presented as an issue again over the past several months.  She has received 2 units of blood and has had several iron infusions.  No longer on oral iron as she has tried several in the past and they all eventually seem to stop working.  Last hemoglobin was 7.0 g and she was transfused with 1 unit of packed red blood cells.  Has repeat blood work at the end of this month and  some more iron infusions scheduled.  Last colonoscopy 2013 and EGD was even prior to that.  We will plan for both EGD and colonoscopy with Dr. Silverio Decamp.  She has had no definite overt GI bleeding, but did have a couple of weeks where her stools were very dark greenish color.  For the past couple of weeks they have been back to normal color again.  The risks, benefits, and alternatives to EGD and colonoscopy were discussed with the patient and she consents to proceed.  Rule out AVMs, neoplasm, malabsorption issue.  She is having celiac labs drawn that have been ordered by her PCP as well.  CC:  Panosh, Standley Brooking, MD

## 2020-12-09 NOTE — Patient Instructions (Signed)
Your provider has requested that you go to the basement level for lab work before leaving today. Press "B" on the elevator. The lab is located at the first door on the left as you exit the elevator.  You have been scheduled for an endoscopy and colonoscopy. Please follow the written instructions given to you at your visit today. Please pick up your prep supplies at the pharmacy within the next 1-3 days. If you use inhalers (even only as needed), please bring them with you on the day of your procedure.  If you are age 45 or older, your body mass index should be between 23-30. Your Body mass index is 24 kg/m. If this is out of the aforementioned range listed, please consider follow up with your Primary Care Provider.  If you are age 92 or younger, your body mass index should be between 19-25. Your Body mass index is 24 kg/m. If this is out of the aformentioned range listed, please consider follow up with your Primary Care Provider.   __________________________________________________________  The [facility] providers would like to encourage you to use [facility] to communicate with providers for non-urgent requests or questions.  Due to long hold times on the telephone, sending your provider a message by [facility] may be a faster and more efficient way to get a response.  Please allow 48 business hours for a response.  Please remember that this is for non-urgent requests.

## 2020-12-10 LAB — CELIAC DISEASE COMPREHENSIVE PANEL WITH REFLEXES
(tTG) Ab, IgA: <1 U/mL
Immunoglobulin A: 194 mg/dL (ref 70–320)

## 2020-12-13 NOTE — Progress Notes (Signed)
Results stable   mildly elevated cholesterol  thyroid and blood sugar  liver and kidney function are normal

## 2020-12-14 ENCOUNTER — Encounter: Payer: Self-pay | Admitting: Certified Registered Nurse Anesthetist

## 2020-12-14 ENCOUNTER — Encounter: Payer: Self-pay | Admitting: Hematology and Oncology

## 2020-12-15 ENCOUNTER — Encounter: Payer: Self-pay | Admitting: Gastroenterology

## 2020-12-15 ENCOUNTER — Other Ambulatory Visit: Payer: Self-pay

## 2020-12-15 ENCOUNTER — Ambulatory Visit (AMBULATORY_SURGERY_CENTER): Payer: Medicare Other | Admitting: Gastroenterology

## 2020-12-15 VITALS — BP 114/60 | HR 72 | Temp 97.5°F | Resp 19 | Ht 67.0 in | Wt 153.0 lb

## 2020-12-15 DIAGNOSIS — D509 Iron deficiency anemia, unspecified: Secondary | ICD-10-CM | POA: Diagnosis not present

## 2020-12-15 DIAGNOSIS — K5730 Diverticulosis of large intestine without perforation or abscess without bleeding: Secondary | ICD-10-CM | POA: Diagnosis not present

## 2020-12-15 DIAGNOSIS — R1310 Dysphagia, unspecified: Secondary | ICD-10-CM

## 2020-12-15 DIAGNOSIS — K2970 Gastritis, unspecified, without bleeding: Secondary | ICD-10-CM

## 2020-12-15 DIAGNOSIS — K2950 Unspecified chronic gastritis without bleeding: Secondary | ICD-10-CM

## 2020-12-15 DIAGNOSIS — K648 Other hemorrhoids: Secondary | ICD-10-CM | POA: Diagnosis not present

## 2020-12-15 DIAGNOSIS — K2100 Gastro-esophageal reflux disease with esophagitis, without bleeding: Secondary | ICD-10-CM | POA: Diagnosis not present

## 2020-12-15 HISTORY — PX: COLONOSCOPY WITH ESOPHAGOGASTRODUODENOSCOPY (EGD): SHX5779

## 2020-12-15 MED ORDER — PANTOPRAZOLE SODIUM 40 MG PO TBEC: DELAYED_RELEASE_TABLET | ORAL | 0 refills | Status: DC

## 2020-12-15 MED ORDER — HYDROCORTISONE ACETATE 25 MG RE SUPP: RECTAL | 0 refills | Status: DC

## 2020-12-15 MED ORDER — SODIUM CHLORIDE 0.9 % IV SOLN: 500.0000 mL | Freq: Once | INTRAVENOUS | Status: DC

## 2020-12-15 MED ORDER — HYDROCORTISONE ACETATE 25 MG RE SUPP: RECTAL | 1 refills | Status: DC

## 2020-12-15 NOTE — Op Note (Signed)
[place] Patient Name: Belinda Harper Procedure Date: 12/15/2020 1:19 PM MRN: [id number] Endoscopist: Mauri Pole , MD Age: 80 Referring MD:  Date of Birth: July 29, 1940 Gender: Female Account #: [id number] Procedure:                Upper GI endoscopy Indications:              Suspected upper gastrointestinal bleeding in                            patient with unexplained iron deficiency anemia Medicines:                Monitored Anesthesia Care Procedure:                Pre-Anesthesia Assessment:                           - Prior to the procedure, a History and Physical                            was performed, and patient medications and                            allergies were reviewed. The patient's tolerance of                            previous anesthesia was also reviewed. The risks                            and benefits of the procedure and the sedation                            options and risks were discussed with the patient.                            All questions were answered, and informed consent                            was obtained. Prior Anticoagulants: The patient has                            taken no previous anticoagulant or antiplatelet                            agents. ASA Grade Assessment: II - A patient with                            mild systemic disease. After reviewing the risks                            and benefits, the patient was deemed in                            satisfactory condition to undergo the procedure.  After obtaining informed consent, the endoscope was                            passed under direct vision. Throughout the                            procedure, the patient's blood pressure, pulse, and                            oxygen saturations were monitored continuously. The                            Endoscope was introduced through the mouth, and                            advanced to  the second part of duodenum. The upper                            GI endoscopy was accomplished without difficulty.                            The patient tolerated the procedure well. Scope In: Scope Out: Findings:                 LA Grade B (one or more mucosal breaks greater than                            5 mm, not extending between the tops of two mucosal                            folds) esophagitis with no bleeding was found 34 to                            35 cm from the incisors.                           The gastroesophageal flap valve was visualized                            endoscopically and classified as Hill Grade II                            (fold present, opens with respiration).                           Patchy mild inflammation characterized by                            congestion (edema), erosions and erythema was found                            in the prepyloric region of the stomach. Biopsies  were taken with a cold forceps for Helicobacter                            pylori testing.                           The examined duodenum was normal. Complications:            No immediate complications. Estimated Blood Loss:     Estimated blood loss was minimal. 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                           - Gastroesophageal flap valve classified as Hill                            Grade II (fold present, opens with respiration).                           - Gastritis. Biopsied.                           - Normal examined duodenum. Recommendation:           - Patient has a contact number available for                            emergencies. The signs and symptoms of potential                            delayed complications were discussed with the                            patient. Return to normal activities tomorrow.                            Written discharge instructions were provided  to the                            patient.                           - Resume previous diet.                           - Continue present medications.                           - Await pathology results.                           - See the other procedure note for documentation of                            additional recommendations.                           -  Follow an antireflux regimen.                           - Use Protonix (pantoprazole) 40 mg PO daily for 3                            months. Mauri Pole, MD 12/15/2020 2:03:06 PM This report has been signed electronically.

## 2020-12-15 NOTE — Progress Notes (Signed)
Called to room to assist during endoscopic procedure.  Patient ID and intended procedure confirmed with present staff. Received instructions for my participation in the procedure from the performing physician.  

## 2020-12-15 NOTE — Progress Notes (Signed)
1330 Robinul 0.1 mg IV given due large amount of secretions upon assessment.  MD made aware, vss  °

## 2020-12-15 NOTE — Progress Notes (Signed)
1345 Ephedrine 10 mg given IV due to low BP, MD updated.   

## 2020-12-15 NOTE — Patient Instructions (Signed)
YOU HAD AN ENDOSCOPIC PROCEDURE TODAY AT [facility]:   Refer to the procedure report that was given to you for any specific questions about what was found during the examination.  If the procedure report does not answer your questions, please call your gastroenterologist to clarify.  If you requested that your care partner not be given the details of your procedure findings, then the procedure report has been included in a sealed envelope for you to review at your convenience later.  YOU SHOULD EXPECT: Some feelings of bloating in the abdomen. Passage of more gas than usual.  Walking can help get rid of the air that was put into your GI tract during the procedure and reduce the bloating. If you had a lower endoscopy (such as a colonoscopy or flexible sigmoidoscopy) you may notice spotting of blood in your stool or on the toilet paper. If you underwent a bowel prep for your procedure, you may not have a normal bowel movement for a few days.  Please Note:  You might notice some irritation and congestion in your nose or some drainage.  This is from the oxygen used during your procedure.  There is no need for concern and it should clear up in a day or so.  SYMPTOMS TO REPORT IMMEDIATELY:  Following lower endoscopy (colonoscopy or flexible sigmoidoscopy):  Excessive amounts of blood in the stool  Significant tenderness or worsening of abdominal pains  Swelling of the abdomen that is new, acute  Fever of 100F or higher  Following upper endoscopy (EGD)  Vomiting of blood or coffee ground material  New chest pain or pain under the shoulder blades  Painful or persistently difficult swallowing  New shortness of breath  Fever of 100F or higher  Black, tarry-looking stools  For urgent or emergent issues, a gastroenterologist can be reached at any hour by calling [phone]. Do not use MyChart messaging for urgent concerns.    DIET:  We do recommend a small meal at first, but  then you may proceed to your regular diet.  Drink plenty of fluids but you should avoid alcoholic beverages for 24 hours.  ACTIVITY:  You should plan to take it easy for the rest of today and you should NOT DRIVE or use heavy machinery until tomorrow (because of the sedation medicines used during the test).    FOLLOW UP: Our staff will call the number listed on your records 48-72 hours following your procedure to check on you and address any questions or concerns that you may have regarding the information given to you following your procedure. If we do not reach you, we will leave a message.  We will attempt to reach you two times.  During this call, we will ask if you have developed any symptoms of COVID 19. If you develop any symptoms (ie: fever, flu-like symptoms, shortness of breath, cough etc.) before then, please call [phone].  If you test positive for Covid 19 in the 2 weeks post procedure, please call and report this information to [place].    If any biopsies were taken you will be contacted by phone or by letter within the next 1-3 weeks.  Please call us at [phone] if you have not heard about the biopsies in 3 weeks.    SIGNATURES/CONFIDENTIALITY: You and/or your care partner have signed paperwork which will be entered into your electronic medical record.  These signatures attest to the fact that that the information above on your After  Visit Summary has been reviewed and is understood.  Full responsibility of the confidentiality of this discharge information lies with you and/or your care-partner.    Resume medications. Call office to schedule hemorrhoid banding,handout given, information given on hemorrhoids,diverticulosis,gastritis.

## 2020-12-15 NOTE — Progress Notes (Signed)
Report given to PACU, vss 

## 2020-12-15 NOTE — Progress Notes (Signed)
Pt's states no medical or surgical changes since previsit or office visit. 

## 2020-12-15 NOTE — Op Note (Signed)
[place] Patient Name: Belinda Harper Procedure Date: 12/15/2020 1:19 PM MRN: [id number] Endoscopist: Mauri Pole , MD Age: 80 Referring MD:  Date of Birth: [birth year] Gender: Female Account #: [id number] Procedure:                Colonoscopy Indications:              Unexplained iron deficiency anemia Medicines:                Monitored Anesthesia Care Procedure:                Pre-Anesthesia Assessment:                           - Prior to the procedure, a History and Physical                            was performed, and patient medications and                            allergies were reviewed. The patient's tolerance of                            previous anesthesia was also reviewed. The risks                            and benefits of the procedure and the sedation                            options and risks were discussed with the patient.                            All questions were answered, and informed consent                            was obtained. Prior Anticoagulants: The patient has                            taken no previous anticoagulant or antiplatelet                            agents. ASA Grade Assessment: II - A patient with                            mild systemic disease. After reviewing the risks                            and benefits, the patient was deemed in                            satisfactory condition to undergo the procedure.                           After obtaining informed consent, the colonoscope  was passed under direct vision. Throughout the                            procedure, the patient's blood pressure, pulse, and                            oxygen saturations were monitored continuously. The                            [id number]-HQ190L Colonoscope was introduced through the                            anus and advanced to the the cecum, identified by                            appendiceal orifice  and ileocecal valve. The                            colonoscopy was performed without difficulty. The                            patient tolerated the procedure well. The quality                            of the bowel preparation was excellent. The                            ileocecal valve, appendiceal orifice, and rectum                            were photographed. Scope In: 1:42:24 PM Scope Out: 1:58:00 PM Scope Withdrawal Time: 0 hours 6 minutes 42 seconds  Total Procedure Duration: 0 hours 15 minutes 36 seconds  Findings:                 The perianal and digital rectal examinations were                            normal.                           Scattered small and large-mouthed diverticula were                            found in the sigmoid colon, descending colon,                            transverse colon and ascending colon.                           Non-bleeding external and ulcerated internal                            hemorrhoids were found during retroflexion. The  hemorrhoids were large. Complications:            No immediate complications. Estimated Blood Loss:     Estimated blood loss was minimal. Impression:               - Severe diverticulosis in the sigmoid colon, in                            the descending colon, in the transverse colon and                            in the ascending colon.                           - Non-bleeding external and internal hemorrhoids.                           - No specimens collected. Recommendation:           - Patient has a contact number available for                            emergencies. The signs and symptoms of potential                            delayed complications were discussed with the                            patient. Return to normal activities tomorrow.                            Written discharge instructions were provided to the                            patient.                            - Resume previous diet.                           - Continue present medications.                           - No repeat colonoscopy due to age.                           - Use hydrocortisone suppository 25 mg 1 per rectum                            once a day for 1 week.                           - Schedule hemorrhoid band ligation next available                            appointment. Please call office to schedule  appointment. Mauri Pole, MD 12/15/2020 2:09:01 PM This report has been signed electronically.

## 2020-12-17 ENCOUNTER — Telehealth: Payer: Self-pay

## 2020-12-17 ENCOUNTER — Telehealth: Payer: Self-pay | Admitting: Gastroenterology

## 2020-12-17 NOTE — Telephone Encounter (Signed)
Discussed with the patient and scheduled her appointment in October.  She also mentions she has not started pantoprazole because she is concerned about the risk of osteoporosis. She reports she is osteoporotic and was previously on Actonel. She no longer take Actonel, but takes Vitamin D supplement and Vitamin D supplement. Discussed the purpose of the 3 month period of pantoprazole. Discussed spacing her vitamins away from the pantoprazole. She states she will consider this and will probably take it but only for 3 months.

## 2020-12-17 NOTE — Telephone Encounter (Signed)
Pt needs to sch banding. Pls call her.

## 2020-12-17 NOTE — Telephone Encounter (Signed)
  Follow up Call-  Call back number 12/15/2020  Post procedure Call Back phone  # [phone]  Permission to leave phone message Yes  Some recent data might be hidden     Patient questions:  Do you have a fever, pain , or abdominal swelling? No. Pain Score  0 *  Have you tolerated food without any problems? Yes.    Have you been able to return to your normal activities? Yes.    Do you have any questions about your discharge instructions: Diet   No. Medications  No. Follow up visit  No.  Do you have questions or concerns about your Care? No.  Actions: * If pain score is 4 or above: No action needed, pain <4.

## 2020-12-18 ENCOUNTER — Ambulatory Visit: Payer: Medicare Other | Admitting: Internal Medicine

## 2020-12-21 ENCOUNTER — Inpatient Hospital Stay: Payer: Medicare Other

## 2020-12-21 ENCOUNTER — Encounter: Payer: Self-pay | Admitting: Hematology and Oncology

## 2020-12-21 ENCOUNTER — Inpatient Hospital Stay (HOSPITAL_BASED_OUTPATIENT_CLINIC_OR_DEPARTMENT_OTHER): Payer: Medicare Other | Admitting: Hematology and Oncology

## 2020-12-21 ENCOUNTER — Other Ambulatory Visit: Payer: Self-pay

## 2020-12-21 VITALS — BP 137/62 | HR 53

## 2020-12-21 VITALS — BP 146/53 | HR 52 | Temp 97.4°F | Resp 18 | Wt 154.4 lb

## 2020-12-21 DIAGNOSIS — D509 Iron deficiency anemia, unspecified: Secondary | ICD-10-CM

## 2020-12-21 DIAGNOSIS — D500 Iron deficiency anemia secondary to blood loss (chronic): Secondary | ICD-10-CM

## 2020-12-21 DIAGNOSIS — D539 Nutritional anemia, unspecified: Secondary | ICD-10-CM

## 2020-12-21 DIAGNOSIS — D72819 Decreased white blood cell count, unspecified: Secondary | ICD-10-CM

## 2020-12-21 DIAGNOSIS — D649 Anemia, unspecified: Secondary | ICD-10-CM

## 2020-12-21 LAB — CBC WITH DIFFERENTIAL/PLATELET
Abs Immature Granulocytes: 0.01 10*3/uL (ref 0.00–0.07)
Basophils Absolute: 0 10*3/uL (ref 0.0–0.1)
Basophils Relative: 1 %
Eosinophils Absolute: 0.2 10*3/uL (ref 0.0–0.5)
Eosinophils Relative: 5 %
HCT: 34.1 % — ABNORMAL LOW (ref 36.0–46.0)
Hemoglobin: 10.5 g/dL — ABNORMAL LOW (ref 12.0–15.0)
Immature Granulocytes: 0 %
Lymphocytes Relative: 30 %
Lymphs Abs: 0.9 10*3/uL (ref 0.7–4.0)
MCH: 28.5 pg (ref 26.0–34.0)
MCHC: 30.8 g/dL (ref 30.0–36.0)
MCV: 92.7 fL (ref 80.0–100.0)
Monocytes Absolute: 0.2 10*3/uL (ref 0.1–1.0)
Monocytes Relative: 8 %
Neutro Abs: 1.7 10*3/uL (ref 1.7–7.7)
Neutrophils Relative %: 56 %
Platelets: 174 10*3/uL (ref 150–400)
RBC: 3.68 MIL/uL — ABNORMAL LOW (ref 3.87–5.11)
RDW: 21.4 % — ABNORMAL HIGH (ref 11.5–15.5)
WBC: 3 10*3/uL — ABNORMAL LOW (ref 4.0–10.5)
nRBC: 0 % (ref 0.0–0.2)

## 2020-12-21 LAB — SAMPLE TO BLOOD BANK

## 2020-12-21 MED ORDER — SODIUM CHLORIDE 0.9 % IV SOLN
300.0000 mg | Freq: Once | INTRAVENOUS | Status: AC
  Administered 2020-12-21: 300 mg via INTRAVENOUS
  Filled 2020-12-21: qty 300

## 2020-12-21 MED ORDER — SODIUM CHLORIDE 0.9 % IV SOLN: INTRAVENOUS | Status: DC

## 2020-12-21 NOTE — Progress Notes (Signed)
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 present since 2007, unknown etiology Recent repeat B12 level in July was adequate She is not symptomatic Observe for now   Orders Placed This Encounter  Procedures   Iron and TIBC    Standing Status:   Standing    Number of Occurrences:   3    Standing Expiration Date:   12/21/2021   Ferritin    Standing Status:   Standing    Number of Occurrences:   3    Standing Expiration Date:   12/21/2021    The total time spent in the appointment was 20 minutes encounter with patients including review of chart and various tests results, discussions about plan of care and coordination of care plan   All questions were answered. The patient knows to call the clinic with any problems, questions or concerns. No barriers to learning was detected.    [name], MD 8/29/202210:43 AM  INTERVAL HISTORY: Belinda Harper 80 y.o. female returns for severe recurrent iron deficiency anemia She felt better She is able to walk without significant dyspnea on exertion She completed recent repeat endoscopy which show evidence of gastritis I have reviewed the surgical report and pathology report She is taking pantoprazole daily The patient denies any recent signs or symptoms of bleeding such as spontaneous epistaxis, hematuria or hematochezia.   SUMMARY OF HEMATOLOGIC HISTORY: Belinda Harper is seen because of recurrent pancytopenia I have not seen her since 2015 She was transferred to my care after her prior physician has left I reviewed the  patient's records extensive and collaborated the history with the patient. Summary of her history is as follows: This is a pleasant woman with multifactorial anemia and associated leukopenia. 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 The chronic anemia and leukopenia go back as far as we have records (2007) when white counts ran as low as 2700. She has a normal differential. White count average is 3000 and has not changed. Platelet count is normal. Best hemoglobin she achieves with parenteral iron replacement is 11 g. She had a nondiagnostic bone marrow biopsy done in 2006. She had received intravenous iron infusion in the past. Her last colonoscopy in May 2013 showed severe diverticulosis with internal hemorrhoids. She feels well. Denies recent infection. She denies symptoms of fatigue. She tolerated iron supplement well. She had periodic hemorrhoidal bleeding. She was found to have abnormal CBC from intermittently over the years by her primary care doctor She has started taking oral iron supplements since November of last year Most recently, she have noted passage of dark stool She attempted to increase her oral iron supplement but still complain of excessive fatigue Recently, her repeat CBC has dropped from 11-8.2 and hence she was referred back here for further evaluation  She denies recent chest pain on exertion, shortness of breath on minimal exertion, pre-syncopal episodes, or palpitations.  The patient denies over the counter NSAID ingestion. She is not on antiplatelets agents. Her last colonoscopy  was in 2013 She had no prior history or diagnosis of cancer. Her age appropriate screening programs are up-to-date. She denies any pica and eats a variety of diet.  The patient was prescribed oral iron supplements and she takes 1 dose of oral iron supplement at night to avoid constipation From Sept 2021 till present, she has  received many doses of intravenous iron infusion On December 15, 2020, she had repeat upper endoscopy evaluation due to severe anemia which showed LA Grade B reflux esophagitis with no bleeding. 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 - Gastritis. Biopsied. - Normal examined duodenum.  I have reviewed the past medical history, past surgical history, social history and family history with the patient and they are unchanged from previous note.  ALLERGIES:  is allergic to sulfamethoxazole, nsaids, and sulfa antibiotics.  MEDICATIONS:  Current Outpatient Medications  Medication Sig Dispense Refill   Cholecalciferol (VITAMIN D3 PO) Take 1,000 Units by mouth daily.     magnesium oxide (MAG-OX) 400 MG tablet Take 400 mg by mouth daily.     amLODipine (NORVASC) 5 MG tablet Take 1 tablet (5 mg total) by mouth daily. 90 tablet 3   Calcium Citrate-Vitamin D (CITRACAL + D PO) Take 1 tablet by mouth daily with lunch. 500 mg calcium (2 X 250 mg)     hydrocortisone (ANUSOL-HC) 25 MG suppository Use one per rectum once daily for one week. 12 suppository 1   Menatetrenone (VITAMIN K2) 100 MCG TABS Take 800 mcg by mouth daily.     metoprolol succinate (TOPROL-XL) 25 MG 24 hr tablet Take 0.5 tablets (12.5 mg total) by mouth at bedtime. 45 tablet 3   Multiple Vitamin (MULTIVITAMIN WITH MINERALS) TABS tablet Take 1 tablet by mouth daily. One a Day - Women's     pantoprazole (PROTONIX) 40 MG tablet Take 40 mg by mouth daily for 3 months. 90 tablet 0   vitamin C (ASCORBIC ACID) 500 MG tablet Take 500 mg by mouth daily.     No current facility-administered medications for this visit.     REVIEW OF SYSTEMS:   Constitutional: Denies fevers, chills or night sweats Eyes: Denies blurriness of vision Ears, nose, mouth, throat, and face: Denies mucositis or sore throat Respiratory: Denies cough, dyspnea or wheezes Cardiovascular: Denies palpitation, chest  discomfort or lower extremity swelling Gastrointestinal:  Denies nausea, heartburn or change in bowel habits Skin: Denies abnormal skin rashes Lymphatics: Denies new lymphadenopathy or easy bruising Neurological:Denies numbness, tingling or new weaknesses Behavioral/Psych: Mood is stable, no new changes  All other systems were reviewed with the patient and are negative.  PHYSICAL EXAMINATION: ECOG PERFORMANCE STATUS: 1 - Symptomatic but completely ambulatory GENERAL:alert, no distress and comfortable NEURO: alert & oriented x 3 with fluent speech, no focal motor/sensory deficits  LABORATORY DATA:  I have reviewed the data as listed     Component Value Date/Time   NA 140 12/09/2020 1443   NA 142 05/28/2012 1600   K 4.1 12/09/2020 1443   K 3.7 05/28/2012 1600   CL 103 12/09/2020 1443   CL 103 05/28/2012 1600   CO2 31 12/09/2020 1443   CO2 29 05/28/2012 1600   GLUCOSE 79 12/09/2020 1443   GLUCOSE 99 05/28/2012 1600   BUN 12 12/09/2020 1443   BUN 19.3 05/28/2012 1600   CREATININE 0.89 12/09/2020 1443   CREATININE 0.91 11/05/2020 1211   CREATININE 0.88 12/06/2019 1147   CREATININE 0.8 05/28/2012 1600  CALCIUM 9.6 12/09/2020 1443   CALCIUM 9.5 05/28/2012 1600   PROT 7.8 12/09/2020 1443   PROT 7.3 05/28/2012 1600   ALBUMIN 4.4 12/09/2020 1443   ALBUMIN 3.5 05/28/2012 1600   AST 25 12/09/2020 1443   AST 23 05/28/2012 1600   ALT 15 12/09/2020 1443   ALT 13 05/28/2012 1600   ALKPHOS 79 12/09/2020 1443   ALKPHOS 82 05/28/2012 1600   BILITOT 0.3 12/09/2020 1443   BILITOT <0.20 05/28/2012 1600   GFRNONAA >60 11/05/2020 1211   GFRAA >60 04/23/2018 1634    No results found for: SPEP, UPEP  Lab Results  Component Value Date   WBC 3.0 (L) 12/21/2020   NEUTROABS 1.7 12/21/2020   HGB 10.5 (L) 12/21/2020   HCT 34.1 (L) 12/21/2020   MCV 92.7 12/21/2020   PLT 174 12/21/2020      Chemistry      Component Value Date/Time   NA 140 12/09/2020 1443   NA 142 05/28/2012 1600    K 4.1 12/09/2020 1443   K 3.7 05/28/2012 1600   CL 103 12/09/2020 1443   CL 103 05/28/2012 1600   CO2 31 12/09/2020 1443   CO2 29 05/28/2012 1600   BUN 12 12/09/2020 1443   BUN 19.3 05/28/2012 1600   CREATININE 0.89 12/09/2020 1443   CREATININE 0.91 11/05/2020 1211   CREATININE 0.88 12/06/2019 1147   CREATININE 0.8 05/28/2012 1600      Component Value Date/Time   CALCIUM 9.6 12/09/2020 1443   CALCIUM 9.5 05/28/2012 1600   ALKPHOS 79 12/09/2020 1443   ALKPHOS 82 05/28/2012 1600   AST 25 12/09/2020 1443   AST 23 05/28/2012 1600   ALT 15 12/09/2020 1443   ALT 13 05/28/2012 1600   BILITOT 0.3 12/09/2020 1443   BILITOT <0.20 05/28/2012 1600

## 2020-12-21 NOTE — Progress Notes (Signed)
Reviewed and agree with documentation and assessment and plan. K. Veena Keanu Frickey , MD   

## 2020-12-21 NOTE — Assessment & Plan Note (Signed)
I have reviewed her recent EGD report She has signs of gastritis, biopsy proven This is likely the source of her bleeding She will receive further IV iron infusion today and 1 more dose next week I plan to bring her in to get repeat labs again in about 6 weeks for further follow-up

## 2020-12-21 NOTE — Assessment & Plan Note (Signed)
She has chronic intermittent leukopenia This has been present since 2007, unknown etiology Recent repeat B12 level in July was adequate She is not symptomatic Observe for now 

## 2020-12-21 NOTE — Patient Instructions (Signed)

## 2020-12-29 ENCOUNTER — Telehealth: Payer: Self-pay | Admitting: Internal Medicine

## 2020-12-29 NOTE — Telephone Encounter (Signed)
Left message for patient to call back and schedule Medicare Annual Wellness Visit (AWV) either virtually or in office. Left  my Belinda Harper number [phone]   Last AWV 12/05/19  please schedule at anytime with [facility] Advance 1 or 2   This should be a 45 minute visit.

## 2020-12-30 ENCOUNTER — Other Ambulatory Visit: Payer: Self-pay

## 2020-12-30 ENCOUNTER — Inpatient Hospital Stay: Payer: Medicare Other | Attending: Hematology and Oncology

## 2020-12-30 VITALS — BP 151/50 | HR 60 | Temp 98.2°F | Resp 16

## 2020-12-30 DIAGNOSIS — D509 Iron deficiency anemia, unspecified: Secondary | ICD-10-CM | POA: Diagnosis not present

## 2020-12-30 MED ORDER — SODIUM CHLORIDE 0.9 % IV SOLN: INTRAVENOUS | Status: DC

## 2020-12-30 MED ORDER — SODIUM CHLORIDE 0.9 % IV SOLN
300.0000 mg | Freq: Once | INTRAVENOUS | Status: AC
  Administered 2020-12-30: 300 mg via INTRAVENOUS
  Filled 2020-12-30: qty 300

## 2020-12-30 NOTE — Patient Instructions (Signed)

## 2020-12-31 ENCOUNTER — Telehealth: Payer: Self-pay | Admitting: Internal Medicine

## 2020-12-31 NOTE — Telephone Encounter (Signed)
Tried returning patient call to schedule Medicare Annual Wellness Visit (AWV) either virtually or in office. Left  my Belinda Harper number [phone]  No answer  Last AWV 12/05/19  please schedule at anytime with [facility] Nurse Health Advisor 1 or 2   This should be a 45 minute visit.

## 2021-01-02 ENCOUNTER — Encounter: Payer: Self-pay | Admitting: Gastroenterology

## 2021-01-07 ENCOUNTER — Other Ambulatory Visit: Payer: Self-pay

## 2021-01-07 ENCOUNTER — Emergency Department (HOSPITAL_COMMUNITY)
Admission: EM | Admit: 2021-01-07 | Discharge: 2021-01-07 | Disposition: A | Discharge status: Home / Self Care | Payer: Medicare Other | Attending: Obstetrics and Gynecology | Admitting: Obstetrics and Gynecology

## 2021-01-07 ENCOUNTER — Emergency Department (HOSPITAL_COMMUNITY): Payer: Medicare Other

## 2021-01-07 ENCOUNTER — Encounter (HOSPITAL_COMMUNITY): Payer: Self-pay

## 2021-01-07 DIAGNOSIS — Y92007 Garden or yard of unspecified non-institutional (private) residence as the place of occurrence of the external cause: Secondary | ICD-10-CM | POA: Insufficient documentation

## 2021-01-07 DIAGNOSIS — S299XXA Unspecified injury of thorax, initial encounter: Secondary | ICD-10-CM | POA: Diagnosis present

## 2021-01-07 DIAGNOSIS — S2241XA Multiple fractures of ribs, right side, initial encounter for closed fracture: Secondary | ICD-10-CM | POA: Insufficient documentation

## 2021-01-07 DIAGNOSIS — W01198A Fall on same level from slipping, tripping and stumbling with subsequent striking against other object, initial encounter: Secondary | ICD-10-CM | POA: Diagnosis not present

## 2021-01-07 DIAGNOSIS — J45909 Unspecified asthma, uncomplicated: Secondary | ICD-10-CM | POA: Diagnosis not present

## 2021-01-07 DIAGNOSIS — W19XXXA Unspecified fall, initial encounter: Secondary | ICD-10-CM

## 2021-01-07 MED ORDER — LIDOCAINE 5 % EX PTCH: 1.0000 | MEDICATED_PATCH | CUTANEOUS | 0 refills | Status: DC

## 2021-01-07 MED ORDER — LIDOCAINE 5 % EX PTCH
1.0000 | MEDICATED_PATCH | CUTANEOUS | Status: DC
  Administered 2021-01-07: 1 via TRANSDERMAL
  Filled 2021-01-07: qty 1

## 2021-01-07 NOTE — ED Provider Notes (Signed)
[facility] DEPT Provider Note   CSN: [id number] Arrival date & time: 01/07/21  1230     History Chief Complaint  Patient presents with   Fall   rib cage pain   Shoulder Pain    Belinda Harper is a 80 y.o. female with past medical history seen below who presents for evaluation of fall.  Patient states a week ago she tripped while out in the yard and fell hitting her right anterior chest.  She denies hitting her head, LOC or anticoagulation.  States since then she has had right rib pain.  She has been taking Tylenol at home which is helped with her pain.  Is been using an incentive spirometer that she had from prior surgery.  She denies any headache, syncope, lightheadedness, exertional pleuritic chest pain, mid back pain, paresthesias, weakness, abdominal pain.  Denies additional aggravating or alleviating factors.  History obtained from patient and past medical records.  No interpreter used.  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   ALLERGIC RHINITIS 01/03/2007   ANEMIA-IRON DEFICIENCY 01/03/2007   Anxiety    ASTHMA 01/03/2007   Blood transfusion 12 2010   hg 4 transfused 4 units   DEPRESSION 01/03/2007   resolved   Family history of adverse reaction to anesthesia    pt's mother as she gets older organs shut down    FRACTURE, ANKLE, LEFT 04/10/2009   Gastritis 2002   GERD (gastroesophageal reflux disease)    HEMORRHOIDS 05/19/2008   HYPERCHOLESTEROLEMIA 05/19/2008   Normal cardiac stress test    2014   OSTEOARTHRITIS 01/03/2007   OSTEOPOROSIS 01/03/2007   hx of actonbel use for at least 5 years    Sepsis due to cellulitis ([place]) 02/05/2015   Unspecified vitamin D deficiency 04/06/2007   UTERINE PROLAPSE 04/06/2007    Patient Active Problem List   Diagnosis Date Noted   Deficiency anemia 12/16/2019   Abnormal LFTs new 10/01/2014   Other hemorrhoids 12/19/2013   Pelvic  prolapse 12/19/2013   Medicare annual wellness visit, initial 12/18/2013   Vertigo, peripheral 11/21/2012   Elevated blood pressure reading 11/21/2012   Dyspnea 10/11/2012   Chest pain 09/19/2012   Decreased exercise tolerance 08/21/2012   Nodule of finger 08/21/2012   Hx of extrinsic asthma 08/21/2012   Leukopenia 07/24/2012   Anemia, normocytic normochromic 07/24/2012   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   Unspecified deficiency anemia 10/04/2011   Ankle pain 08/29/2011   Abnormal gait 08/29/2011   Numbness of foot 08/29/2011   Leg length discrepancy 08/07/2011   Scoliosis 08/07/2011   Hx of fall 08/07/2011   Medicare annual wellness visit, subsequent 08/07/2011   Hematuria 02/23/2011   Rectal prolapse 07/03/2010   Visit for preventive health examination 06/28/2010   MALAISE AND FATIGUE 01/27/2010   NUMBNESS 01/27/2010   FRACTURE, ANKLE, LEFT 04/10/2009   HYPERCHOLESTEROLEMIA 05/19/2008   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   HEMORRHOIDS 05/19/2008   UNSPECIFIED VITAMIN D DEFICIENCY 04/06/2007   Uterine prolapse 04/06/2007   Iron deficiency anemia 01/03/2007   DEPRESSION 01/03/2007   ALLERGIC RHINITIS 01/03/2007   ASTHMA 01/03/2007   OSTEOARTHRITIS 01/03/2007   Osteoporosis 01/03/2007    Past Surgical History:  Procedure Laterality Date   ANKLE FRACTURE SURGERY  02/2009   left ankle fracture and dislocation   CATARACT EXTRACTION Left    last year    CATARACT  EXTRACTION W/ INTRAOCULAR LENS IMPLANT  2000   right eye   HARDWARE REMOVAL Left 02/21/2015   Procedure: IRRIGATION AND DEBRIDEMENT HARDWARE REMOVAL LEFT ANKLE ;  Surgeon: Gaynelle Arabian, MD;  Location: [facility];  Service: Orthopedics;  Laterality: Left;   MASS EXCISION Left 09/11/2012   Procedure: 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; Rotation Flap;  Surgeon: Wynonia Sours, MD;  Location: [facility];  Service: Orthopedics;  Laterality: Left;   ORIF  FINGER / THUMB FRACTURE  1970   reattached   PANENDOSCOPY     small bowel bx     OB History     Gravida  3   Para  2   Term      Preterm      AB  1   Living  2      SAB  1   IAB      Ectopic      Multiple      Live Births              Family History  Problem Relation Age of Onset   Hyperlipidemia Mother    Heart disease Mother        CABG age 41   Ovarian cancer Mother    Uterine cancer Mother    Arthritis Mother    Osteoporosis Mother    Heart disease Father        CABG age 74   Arthritis Father    Melanoma Father    Colon cancer Neg Hx    Esophageal cancer Neg Hx    Rectal cancer Neg Hx    Stomach cancer Neg Hx     Social History   Tobacco Use   Smoking status: Never   Smokeless tobacco: Never  Vaping Use   Vaping Use: Never used  Substance Use Topics   Alcohol use: Not Currently   Drug use: No    Home Medications Prior to Admission medications   Medication Sig Start Date End Date Taking? Authorizing Provider  lidocaine (LIDODERM) 5 % Place 1 patch onto the skin daily. Remove & Discard patch within 12 hours or as directed by MD 01/07/21  Yes Aribelle Mccosh A, PA-C  amLODipine (NORVASC) 5 MG tablet Take 1 tablet (5 mg total) by mouth daily. 01/30/20   Panosh, Standley Brooking, MD  Calcium Citrate-Vitamin D (CITRACAL + D PO) Take 1 tablet by mouth daily with lunch. 500 mg calcium (2 X 250 mg)    [provider]  Cholecalciferol (VITAMIN D3 PO) Take 1,000 Units by mouth daily.    [provider]  hydrocortisone (ANUSOL-HC) 25 MG suppository Use one per rectum once daily for one week. 12/15/20   Mauri Pole, MD  magnesium oxide (MAG-OX) 400 MG tablet Take 400 mg by mouth daily.    [provider]  Menatetrenone (VITAMIN K2) 100 MCG TABS Take 800 mcg by mouth daily.    [provider]  metoprolol succinate (TOPROL-XL) 25 MG 24 hr tablet Take 0.5 tablets (12.5 mg total) by mouth at bedtime. 10/13/20   Hilty,  Nadean Corwin, MD  Multiple Vitamin (MULTIVITAMIN WITH MINERALS) TABS tablet Take 1 tablet by mouth daily. One a Day Wellsite geologist, Historical, MD  pantoprazole (PROTONIX) 40 MG tablet Take 40 mg by mouth daily for 3 months. 12/15/20   Mauri Pole, MD  vitamin C (ASCORBIC ACID) 500 MG tablet Take 500 mg by mouth daily.  [provider]    Allergies    Sulfamethoxazole, Nsaids, and Sulfa antibiotics  Review of Systems   Review of Systems  Constitutional: Negative.   HENT: Negative.    Respiratory: Negative.    Cardiovascular:  Positive for chest pain (Right anterior chest wall pain).  Gastrointestinal: Negative.   Genitourinary: Negative.   Musculoskeletal: Negative.   Skin: Negative.   Neurological: Negative.   All other systems reviewed and are negative.  Physical Exam Updated Vital Signs BP 134/80   Pulse 64   Temp 98.6 F (37 C) (Oral)   Resp 16   Ht '5\' 7"'$  (1.702 m)   Wt 70.8 kg   SpO2 100%   BMI 24.43 kg/m   Physical Exam Vitals and nursing note reviewed.  Constitutional:      General: She is not in acute distress.    Appearance: She is well-developed. She is not ill-appearing, toxic-appearing or diaphoretic.  HENT:     Head: Normocephalic and atraumatic.     Nose: Nose normal.     Mouth/Throat:     Mouth: Mucous membranes are moist.  Eyes:     Pupils: Pupils are equal, round, and reactive to light.  Cardiovascular:     Rate and Rhythm: Normal rate.     Pulses: Normal pulses.          Radial pulses are 2+ on the right side and 2+ on the left side.     Heart sounds: Normal heart sounds.  Pulmonary:     Effort: Pulmonary effort is normal. No respiratory distress.     Breath sounds: Normal breath sounds and air entry.     Comments: Clear bilaterally, speaks in full sentences without difficulty Chest:     Comments: Tenderness to right anterior chest wall.  No crepitus, step-off. Abdominal:     General: Bowel sounds are normal. There is  no distension.     Palpations: Abdomen is soft.     Tenderness: There is no abdominal tenderness. There is no right CVA tenderness, left CVA tenderness, guarding or rebound. Negative signs include Murphy's sign.     Hernia: No hernia is present.     Comments: Abdomen soft, nontender.  No overlying skin change  Musculoskeletal:        General: Normal range of motion.     Cervical back: Normal range of motion.     Comments: No bony tenderness.  Moves all 4 extremities at difficulty.  No midline back pain. Compartments soft  Skin:    General: Skin is warm and dry.     Capillary Refill: Capillary refill takes less than 2 seconds.     Comments: No edema, erythema or warmth.  No overlying abrasions, ecchymosis  Neurological:     General: No focal deficit present.     Mental Status: She is alert and oriented to person, place, and time.     Comments: Ambulatory without ataxia Intact sensation  Psychiatric:        Mood and Affect: Mood normal.    ED Results / Procedures / Treatments   Labs (all labs ordered are listed, but only abnormal results are displayed) Labs Reviewed - No data to display  EKG None  Radiology DG Ribs Unilateral W/Chest Right  Result Date: 01/07/2021 CLINICAL DATA:  Right rib pain after fall last week. EXAM: RIGHT RIBS AND CHEST - 3+ VIEW COMPARISON:  March 03, 2020. FINDINGS: Mild deformity is seen involving the right sixth and seventh ribs concerning for  fractures of indeterminate age. There is no evidence of pneumothorax. Minimal left pleural effusion is noted. Both lungs are clear. Heart size and mediastinal contours are within normal limits. IMPRESSION: Mild deformity is seen involving the right sixth and seventh ribs concerning for fractures of indeterminate age. Minimal left pleural effusion is noted. Electronically Signed   By: Marijo Conception M.D.   On: 01/07/2021 14:23    Procedures Procedures   Medications Ordered in ED Medications  lidocaine  (LIDODERM) 5 % 1 patch (1 patch Transdermal Patch Applied 01/07/21 1723)    ED Course  I have reviewed the triage vital signs and the nursing notes.  Pertinent labs & imaging results that were available during my care of the patient were reviewed by me and considered in my medical decision making (see chart for details).  Here for evaluation mechanical fall which occurred 1 week ago.  She is afebrile, nonseptic, not ill-appearing.  Denies any head, LOC or anticoagulation.  She has some mild right anterior chest wall pain, no crepitus, overlying skin changes.  Her abdomen is soft, nontender.  She has a nonfocal neuro exam without deficits.  Her pain has been controlled with Tylenol at home and she has been using her home incentive spirometer.  States she is just here to see if she has any rib fractures  X-ray here shows right sixth/seventh possible fractures of indeterminate age, no pneumothorax.  Minimal left pleural effusion.  Given incentive spirometry with good results here.  She is ambulatory without any hypoxia.  Her pain is controlled.  Did offer CT scan to further assess however patient declines.  Discussed lidocaine patches, Tylenol at home, incentive spirometry while awake, close follow-up with PCP.  She is agreeable   The patient has been appropriately medically screened and/or stabilized in the ED. I have low suspicion for any other emergent medical condition which would require further screening, evaluation or treatment in the ED or require inpatient management.  Patient is hemodynamically stable and in no acute distress.  Patient able to ambulate in department prior to ED.  Evaluation does not show acute pathology that would require ongoing or additional emergent interventions while in the emergency department or further inpatient treatment.  I have discussed the diagnosis with the patient and answered all questions.  Pain is been managed while in the emergency department and patient has  no further complaints prior to discharge.  Patient is comfortable with plan discussed in room and is stable for discharge at this time.  I have discussed strict return precautions for returning to the emergency department.  Patient was encouraged to follow-up with PCP/specialist refer to at discharge.   Discussed with attending, Dr. Doren Custard who agrees with above treatment, plan and disposition    MDM Rules/Calculators/A&P                            Final Clinical Impression(s) / ED Diagnoses Final diagnoses:  Fall, initial encounter  Closed fracture of multiple ribs of right side, initial encounter    Rx / DC Orders ED Discharge Orders          Ordered    lidocaine (LIDODERM) 5 %  Every 24 hours        01/07/21 1814             Selicia Windom A, PA-C 01/07/21 1816    Godfrey Pick, MD 01/08/21 1524

## 2021-01-07 NOTE — ED Provider Notes (Signed)
Emergency Medicine Provider Triage Evaluation Note  Belinda Harper , a 80 y.o. female  was evaluated in triage.  Pt complains of falling down a hill after she tripped on a root.  She denies LOC or hitting her head.  No neck pain.  She reports pain in her right sided ribs and shoulder/shoulder blade.  She reports that today the pain is worse than it was.  She reports that today she had a panic attack over it but otherwise hasn't had any shortness of breath otherwise.  Tylenol helps the pain.  No fevers.   Review of Systems  Positive: Right sided chest pain Negative: Syncope, headache  Physical Exam  BP (!) 161/58 (BP Location: Right Arm)   Pulse 70   Temp 98.6 F (37 C) (Oral)   Resp 18   Ht '5\' 7"'$  (1.702 m)   Wt 70.8 kg   SpO2 100%   BMI 24.43 kg/m  Gen:   Awake, no distress   Resp:  Normal effort  MSK:   Moves extremities without difficulty  Other:  TTP diffusely over right sided chest.  Lungs CTAB.   Medical Decision Making  Medically screening exam initiated at 1:05 PM.  Appropriate orders placed.  CHERISE SKINNER was informed that the remainder of the evaluation will be completed by another provider, this initial triage assessment does not replace that evaluation, and the importance of remaining in the ED until their evaluation is complete.  Note: Portions of this report may have been transcribed using voice recognition software. Every effort was made to ensure accuracy; however, inadvertent computerized transcription errors may be present    Ollen Gross 01/07/21 1629    Lacretia Leigh, MD 01/09/21 1146

## 2021-01-07 NOTE — Discharge Instructions (Signed)
Continue to take Tylenol as needed for pain.  May also use the lidocaine patches I have prescribed.  Placed to the area for 12 hours.  Must remove patch for 12 hours before placing additional patch.  Use your incentive spirometer every 2 hours while awake.   Follow up with Primary care provider  Return for new or worsening symptoms

## 2021-01-07 NOTE — ED Triage Notes (Signed)
Patient states she tripped on a root a week ago and fell down a hill. Patient c/o right rib cage pain and right shoulder pain that has worsened since the fall.  Patient denies LOC or hitting her head.

## 2021-01-18 ENCOUNTER — Other Ambulatory Visit: Payer: Self-pay

## 2021-01-18 NOTE — Progress Notes (Signed)
Chief Complaint  Patient presents with   Hospitalization Follow-up    HPI: EULINE KIMBLER 80 y.o. come in for FU ed   fall and diagnosed rib fracture right 6 and 7 was at the [place] 9 root  trip and pushed down hill to  avoid falling into the prongs of the leg.  Landed on right side   got up on own. No Loc   walk was sore   left upper  rib cage  and back and hip and right arm  progressed  and hurt all the time so went to ED   ( 3 hours - 5   wait   to ed. )  suggested rest  and lidocant pathces   but  insurance  didn't pay for theses   so used tylenol. is getting better mostly on the right back area able to take deep breaths no cough of significance.  Has not decided about osteoporosis medications did have a history of Actonel use in the past and had what sounds like bone aches.  Not sure of the potential cardiovascular side effects possible.  She is to get her hemorrhoids fixed and continue with her iron infusions.  In regard to her anemia ROS: See pertinent positives and negatives per HPI.  Family worry some about her memory problems she does not as much has ways of tracking.  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   ALLERGIC RHINITIS 01/03/2007   ANEMIA-IRON DEFICIENCY 01/03/2007   Anxiety    ASTHMA 01/03/2007   Blood transfusion 12 2010   hg 4 transfused 4 units   DEPRESSION 01/03/2007   resolved   Family history of adverse reaction to anesthesia    pt's mother as she gets older organs shut down    FRACTURE, ANKLE, LEFT 04/10/2009   Gastritis 2002   GERD (gastroesophageal reflux disease)    HEMORRHOIDS 05/19/2008   HYPERCHOLESTEROLEMIA 05/19/2008   Normal cardiac stress test    2014   OSTEOARTHRITIS 01/03/2007   OSTEOPOROSIS 01/03/2007   hx of actonbel use for at least 5 years    Sepsis due to cellulitis ([facility]) 02/05/2015   Unspecified vitamin D deficiency 04/06/2007   UTERINE PROLAPSE 04/06/2007    Family  History  Problem Relation Age of Onset   Hyperlipidemia Mother    Heart disease Mother        CABG age 68   Ovarian cancer Mother    Uterine cancer Mother    Arthritis Mother    Osteoporosis Mother    Heart disease Father        CABG age 82   Arthritis Father    Melanoma Father    Colon cancer Neg Hx    Esophageal cancer Neg Hx    Rectal cancer Neg Hx    Stomach cancer Neg Hx     Social History   Socioeconomic History   Marital status: Divorced    Spouse name: Not on file   Number of children: 2   Years of education: Not on file   Highest education level: Not on file  Occupational History   Occupation: retired  Tobacco Use   Smoking status: Never   Smokeless tobacco: Never  Vaping Use   Vaping Use: Never used  Substance and Sexual Activity   Alcohol use: Not Currently   Drug use: No   Sexual activity: Not Currently    Birth control/protection: Post-menopausal  Comment: 1st intercourse- 18, partners- 1   Other Topics Concern   Not on file  Social History Narrative   HH of  1   To rent out room    Retired Dispensing optician   Never smoked    Pet cat allergic    Divorced   Regular exericise  walking mowing the lawnswimming   Silver sneakers    Social Determinants of Radio broadcast assistant Strain: Not on file  Food Insecurity: Not on file  Transportation Needs: Not on file  Physical Activity: Not on file  Stress: Not on file  Social Connections: Not on file    Outpatient Medications Prior to Visit  Medication Sig Dispense Refill   amLODipine (NORVASC) 5 MG tablet Take 1 tablet (5 mg total) by mouth daily. 90 tablet 3   Calcium Citrate-Vitamin D (CITRACAL + D PO) Take 1 tablet by mouth daily with lunch. 500 mg calcium (2 X 250 mg)     Cholecalciferol (VITAMIN D3 PO) Take 1,000 Units by mouth daily.     magnesium oxide (MAG-OX) 400 MG tablet Take 400 mg by mouth daily.     Menatetrenone (VITAMIN K2) 100 MCG TABS Take 800 mcg by mouth daily.      metoprolol succinate (TOPROL-XL) 25 MG 24 hr tablet Take 0.5 tablets (12.5 mg total) by mouth at bedtime. 45 tablet 3   Multiple Vitamin (MULTIVITAMIN WITH MINERALS) TABS tablet Take 1 tablet by mouth daily. One a Day - Women's     pantoprazole (PROTONIX) 40 MG tablet Take 40 mg by mouth daily for 3 months. 90 tablet 0   vitamin C (ASCORBIC ACID) 500 MG tablet Take 500 mg by mouth daily.     hydrocortisone (ANUSOL-HC) 25 MG suppository Use one per rectum once daily for one week. 12 suppository 1   lidocaine (LIDODERM) 5 % Place 1 patch onto the skin daily. Remove & Discard patch within 12 hours or as directed by MD 30 patch 0   No facility-administered medications prior to visit.     EXAM:  BP 132/60 (BP Location: Left Arm, Patient Position: Sitting, Cuff Size: Normal)   Pulse 65   Temp 98.5 F (36.9 C) (Oral)   Ht 5\' 7"  (1.702 m)   Wt 155 lb 6.4 oz (70.5 kg)   SpO2 97%   BMI 24.34 kg/m   Body mass index is 24.34 kg/m.  GENERAL: vitals reviewed and listed above, alert, oriented, appears well hydrated and in no acute distress HEENT: atraumatic, conjunctiva  clear, no obvious abnormalities on inspection of external nose and ears OP : Masked NECK: no obvious masses on inspection palpation  LUNGS: clear to auscultation bilaterally, no wheezes, rales or rhonchi, good air movement some tenderness in the right lateral back and posterior I see no bruising and no step-off but there is some localized point tenderness CV: HRRR, no clubbing cyanosis or  peripheral edema nl cap refill  MS: moves all extremities without noticeable focal  abnormality PSYCH: pleasant and cooperative, no obvious depression or anxiety Lab Results  Component Value Date   WBC 3.0 (L) 12/21/2020   HGB 10.5 (L) 12/21/2020   HCT 34.1 (L) 12/21/2020   PLT 174 12/21/2020   GLUCOSE 79 12/09/2020   CHOL 227 (H) 12/09/2020   TRIG 118.0 12/09/2020   HDL 79.00 12/09/2020   LDLDIRECT 141.9 08/06/2012   LDLCALC 124 (H)  12/09/2020   ALT 15 12/09/2020   AST 25 12/09/2020   NA 140 12/09/2020  K 4.1 12/09/2020   CL 103 12/09/2020   CREATININE 0.89 12/09/2020   BUN 12 12/09/2020   CO2 31 12/09/2020   TSH 1.19 12/09/2020   INR 1.04 04/10/2009   HGBA1C 5.6 11/21/2018   BP Readings from Last 3 Encounters:  01/19/21 132/60  01/07/21 139/64  12/30/20 (!) 151/50   ED record review ASSESSMENT AND PLAN:  Discussed the following assessment and plan:  Closed fracture of multiple ribs of right side, sequela  History of fracture due to fall  Age-related osteoporosis without current pathological fracture Still consider Prolia or such. Make follow-up visit in November or as needed. -Patient advised to return or notify health care team  if  new concerns arise.  Patient Instructions  Good to see you today .   If not continuing to improve in another 4 weeks let us know  or FU. Consider prolia in future  let us know.  Stay on your feet.? Caution with uneven ground.     Standley Brooking. Grey Rakestraw M.D.

## 2021-01-19 ENCOUNTER — Ambulatory Visit (INDEPENDENT_AMBULATORY_CARE_PROVIDER_SITE_OTHER): Payer: Medicare Other | Admitting: Internal Medicine

## 2021-01-19 ENCOUNTER — Encounter: Payer: Self-pay | Admitting: Internal Medicine

## 2021-01-19 VITALS — BP 132/60 | HR 65 | Temp 98.5°F | Ht 67.0 in | Wt 155.4 lb

## 2021-01-19 DIAGNOSIS — M810 Age-related osteoporosis without current pathological fracture: Secondary | ICD-10-CM

## 2021-01-19 DIAGNOSIS — Z8781 Personal history of (healed) traumatic fracture: Secondary | ICD-10-CM

## 2021-01-19 DIAGNOSIS — S2241XS Multiple fractures of ribs, right side, sequela: Secondary | ICD-10-CM | POA: Diagnosis not present

## 2021-01-19 NOTE — Patient Instructions (Signed)
Good to see you today .   If not continuing to improve in another 4 weeks let us know  or FU. Consider prolia in future  let us know.  Stay on your feet.? Caution with uneven ground.

## 2021-02-02 ENCOUNTER — Other Ambulatory Visit: Payer: Self-pay | Admitting: Internal Medicine

## 2021-02-02 ENCOUNTER — Inpatient Hospital Stay: Payer: Medicare Other

## 2021-02-02 ENCOUNTER — Other Ambulatory Visit: Payer: Self-pay | Admitting: *Deleted

## 2021-02-02 ENCOUNTER — Inpatient Hospital Stay (HOSPITAL_BASED_OUTPATIENT_CLINIC_OR_DEPARTMENT_OTHER): Payer: Medicare Other | Admitting: Hematology and Oncology

## 2021-02-02 ENCOUNTER — Encounter: Payer: Self-pay | Admitting: Hematology and Oncology

## 2021-02-02 ENCOUNTER — Other Ambulatory Visit: Payer: Self-pay

## 2021-02-02 ENCOUNTER — Inpatient Hospital Stay: Payer: Medicare Other | Attending: Hematology and Oncology

## 2021-02-02 VITALS — BP 139/54 | HR 71 | Temp 97.4°F | Resp 16 | Ht 67.0 in | Wt 157.2 lb

## 2021-02-02 VITALS — BP 135/58 | HR 63 | Temp 98.6°F | Resp 16

## 2021-02-02 DIAGNOSIS — D539 Nutritional anemia, unspecified: Secondary | ICD-10-CM

## 2021-02-02 DIAGNOSIS — D500 Iron deficiency anemia secondary to blood loss (chronic): Secondary | ICD-10-CM

## 2021-02-02 DIAGNOSIS — Z79899 Other long term (current) drug therapy: Secondary | ICD-10-CM | POA: Diagnosis not present

## 2021-02-02 DIAGNOSIS — D509 Iron deficiency anemia, unspecified: Secondary | ICD-10-CM

## 2021-02-02 DIAGNOSIS — D649 Anemia, unspecified: Secondary | ICD-10-CM

## 2021-02-02 DIAGNOSIS — I10 Essential (primary) hypertension: Secondary | ICD-10-CM

## 2021-02-02 LAB — CBC WITH DIFFERENTIAL/PLATELET
Abs Immature Granulocytes: 0.01 10*3/uL (ref 0.00–0.07)
Basophils Absolute: 0 10*3/uL (ref 0.0–0.1)
Basophils Relative: 1 %
Eosinophils Absolute: 0.1 10*3/uL (ref 0.0–0.5)
Eosinophils Relative: 3 %
HCT: 25.9 % — ABNORMAL LOW (ref 36.0–46.0)
Hemoglobin: 8.1 g/dL — ABNORMAL LOW (ref 12.0–15.0)
Immature Granulocytes: 0 %
Lymphocytes Relative: 22 %
Lymphs Abs: 0.9 10*3/uL (ref 0.7–4.0)
MCH: 30.2 pg (ref 26.0–34.0)
MCHC: 31.3 g/dL (ref 30.0–36.0)
MCV: 96.6 fL (ref 80.0–100.0)
Monocytes Absolute: 0.3 10*3/uL (ref 0.1–1.0)
Monocytes Relative: 6 %
Neutro Abs: 2.9 10*3/uL (ref 1.7–7.7)
Neutrophils Relative %: 68 %
Platelets: 208 10*3/uL (ref 150–400)
RBC: 2.68 MIL/uL — ABNORMAL LOW (ref 3.87–5.11)
RDW: 17.2 % — ABNORMAL HIGH (ref 11.5–15.5)
WBC: 4.2 10*3/uL (ref 4.0–10.5)
nRBC: 0 % (ref 0.0–0.2)

## 2021-02-02 LAB — RETICULOCYTES
Immature Retic Fract: 32.3 % — ABNORMAL HIGH (ref 2.3–15.9)
RBC.: 2.71 MIL/uL — ABNORMAL LOW (ref 3.87–5.11)
Retic Count, Absolute: 128.7 10*3/uL (ref 19.0–186.0)
Retic Ct Pct: 4.8 % — ABNORMAL HIGH (ref 0.4–3.1)

## 2021-02-02 LAB — SAMPLE TO BLOOD BANK

## 2021-02-02 LAB — IRON AND TIBC
Iron: 37 ug/dL — ABNORMAL LOW (ref 41–142)
Saturation Ratios: 14 % — ABNORMAL LOW (ref 21–57)
TIBC: 263 ug/dL (ref 236–444)
UIBC: 226 ug/dL (ref 120–384)

## 2021-02-02 LAB — FERRITIN: Ferritin: 475 ng/mL — ABNORMAL HIGH (ref 11–307)

## 2021-02-02 MED ORDER — SODIUM CHLORIDE 0.9 % IV SOLN: Freq: Once | INTRAVENOUS | Status: AC

## 2021-02-02 MED ORDER — SODIUM CHLORIDE 0.9 % IV SOLN
300.0000 mg | Freq: Once | INTRAVENOUS | Status: AC
  Administered 2021-02-02: 300 mg via INTRAVENOUS
  Filled 2021-02-02: qty 300

## 2021-02-02 NOTE — Progress Notes (Signed)
Patient declined to stay for full 30 minute observation but waited for 15 minutes with no adverse reaction. Vitals stable and patient in no distress upon leaving infusion room.

## 2021-02-02 NOTE — Patient Instructions (Signed)

## 2021-02-02 NOTE — Assessment & Plan Note (Signed)
She has significant ongoing chronic GI bleed causing severe, recurrent anemia again I felt like there is a combination of iron deficiency anemia and anemia chronic illness given lack of significant reticulocytosis She does not need blood transfusion today I am not able to keep up with her ongoing need for IV iron infusion I will try to get insurance approval to switch her to Monoferric In the meantime, we will proceed with IV iron today and a few more doses of IV iron in the near future I will see her back next month for further follow-up She has appointment to see GI for evaluation and management of chronic hemorrhoidal bleeding

## 2021-02-02 NOTE — Progress Notes (Addendum)
[facility] OFFICE PROGRESS NOTE  Panosh, Standley Brooking, MD  ASSESSMENT & PLAN:  Deficiency anemia She has significant ongoing chronic GI bleed causing severe, recurrent anemia again I felt like there is a combination of iron deficiency anemia and anemia chronic illness given lack of significant reticulocytosis She does not need blood transfusion today I am not able to keep up with her ongoing need for IV iron infusion I will try to get insurance approval to switch her to Monoferric In the meantime, we will proceed with IV iron today and a few more doses of IV iron in the near future I will see her back next month for further follow-up She has appointment to see GI for evaluation and management of chronic hemorrhoidal bleeding  Orders Placed This Encounter  Procedures   Reticulocytes    Standing Status:   Future    Number of Occurrences:   1    Standing Expiration Date:   02/02/2022    The total time spent in the appointment was 20 minutes encounter with patients including review of chart and various tests results, discussions about plan of care and coordination of care plan   All questions were answered. The patient knows to call the clinic with any problems, questions or concerns. No barriers to learning was detected.    [name], MD 10/11/20225:07 PM  INTERVAL HISTORY: Belinda Harper 80 y.o. 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 Summary of her history is as follows: This is a pleasant woman with multifactorial anemia and associated leukopenia. 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 The chronic anemia and leukopenia go back as far as we have records (2007) when white counts ran as low as 2700. She has a normal differential. White count average is 3000 and has not changed. Platelet count is normal. Best hemoglobin she achieves with parenteral iron replacement is 11 g. She had a nondiagnostic bone marrow biopsy done in 2006. She had received intravenous iron infusion in the past. Her last colonoscopy in May 2013 showed severe diverticulosis with internal hemorrhoids. She feels well. Denies recent infection. She denies symptoms of fatigue. She tolerated iron supplement well. She had periodic hemorrhoidal bleeding. She was found to have abnormal CBC from intermittently over the years by her primary care doctor She has started taking oral iron supplements since November of last year Most recently, she have noted passage of dark stool She attempted to increase her oral iron supplement but still complain of excessive fatigue Recently, her repeat CBC has dropped from 11-8.2 and hence she was referred back here for further evaluation On 12/15/20, she had repeat EGD and colonoscopy which showed: - Severe diverticulosis in the sigmoid colon, in the descending colon, in the transverse colon and in the ascending colon. - Non-bleeding external and internal hemorrhoids. - No specimens collected.  She denies recent  chest pain on exertion, shortness of breath on minimal exertion, pre-syncopal episodes, or palpitations.  The patient denies over the counter NSAID ingestion. She is not on antiplatelets agents. Her last colonoscopy was in 2013 She had no prior history or diagnosis of cancer.  Her age appropriate screening programs are up-to-date. She denies any pica and eats a variety of diet.  The patient was prescribed oral iron supplements and she takes 1 dose of oral iron supplement at night to avoid constipation From Sept 2021 till present, she has received many doses of intravenous iron infusion On December 15, 2020, she had repeat upper endoscopy evaluation due to severe anemia which showed LA Grade B reflux esophagitis with no bleeding. 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 - Gastritis. Biopsied. - Normal examined duodenum.  I have reviewed the past medical history, past surgical history, social history and family history with the patient and they are unchanged from previous note.  ALLERGIES:  is allergic to sulfamethoxazole, nsaids, and sulfa antibiotics.  MEDICATIONS:  Current Outpatient Medications  Medication Sig Dispense Refill   amLODipine (NORVASC) 5 MG tablet Take 1 tablet (5 mg total) by mouth daily. 90 tablet 3   Calcium Citrate-Vitamin D (CITRACAL + D PO) Take 1 tablet by mouth daily with lunch. 500 mg calcium (2 X 250 mg)     Cholecalciferol (VITAMIN D3 PO) Take 1,000 Units by mouth daily.     magnesium oxide (MAG-OX) 400 MG tablet Take 400 mg by mouth daily.     Menatetrenone (VITAMIN K2) 100 MCG TABS Take 800 mcg by mouth daily.     metoprolol succinate (TOPROL-XL) 25 MG 24 hr tablet Take 0.5 tablets (12.5 mg total) by mouth at bedtime. 45 tablet 3   Multiple Vitamin (MULTIVITAMIN WITH MINERALS) TABS tablet Take 1 tablet by mouth daily. One a Day - Women's     pantoprazole (PROTONIX) 40 MG tablet Take 40 mg by mouth daily for 3 months. 90 tablet 0   vitamin C (ASCORBIC ACID) 500 MG tablet Take 500 mg by mouth daily.     No current facility-administered medications for this visit.     REVIEW OF SYSTEMS:   Constitutional: Denies fevers, chills or night sweats Eyes: Denies blurriness of  vision Ears, nose, mouth, throat, and face: Denies mucositis or sore throat Respiratory: Denies cough, dyspnea or wheezes Cardiovascular: Denies palpitation, chest discomfort or lower extremity swelling Gastrointestinal:  Denies nausea, heartburn or change in bowel habits Skin: Denies abnormal skin rashes Lymphatics: Denies new lymphadenopathy or easy bruising Neurological:Denies numbness, tingling or new weaknesses Behavioral/Psych: Mood is stable, no new changes  All other systems were reviewed with the patient and are negative.  PHYSICAL EXAMINATION: ECOG PERFORMANCE STATUS: 1 - Symptomatic but completely ambulatory  Vitals:   02/02/21 1239  BP: (!) 139/54  Pulse: 71  Resp: 16  Temp: (!) 97.4 F (36.3 C)  SpO2: 100%   Filed Weights   02/02/21 1239  Weight: 157 lb 3.2 oz (71.3 kg)    GENERAL:alert, no distress and comfortable.  She looks pale NEURO: alert & oriented x 3 with fluent speech, no focal motor/sensory deficits  LABORATORY DATA:  I have reviewed the data as listed     Component Value Date/Time   NA 140 12/09/2020 1443   NA 142 05/28/2012 1600   K 4.1 12/09/2020 1443   K 3.7 05/28/2012 1600   CL 103 12/09/2020 1443   CL 103 05/28/2012 1600  CO2 31 12/09/2020 1443   CO2 29 05/28/2012 1600   GLUCOSE 79 12/09/2020 1443   GLUCOSE 99 05/28/2012 1600   BUN 12 12/09/2020 1443   BUN 19.3 05/28/2012 1600   CREATININE 0.89 12/09/2020 1443   CREATININE 0.91 11/05/2020 1211   CREATININE 0.88 12/06/2019 1147   CREATININE 0.8 05/28/2012 1600   CALCIUM 9.6 12/09/2020 1443   CALCIUM 9.5 05/28/2012 1600   PROT 7.8 12/09/2020 1443   PROT 7.3 05/28/2012 1600   ALBUMIN 4.4 12/09/2020 1443   ALBUMIN 3.5 05/28/2012 1600   AST 25 12/09/2020 1443   AST 23 05/28/2012 1600   ALT 15 12/09/2020 1443   ALT 13 05/28/2012 1600   ALKPHOS 79 12/09/2020 1443   ALKPHOS 82 05/28/2012 1600   BILITOT 0.3 12/09/2020 1443   BILITOT <0.20 05/28/2012 1600   GFRNONAA >60 11/05/2020  1211   GFRAA >60 04/23/2018 1634    No results found for: SPEP, UPEP  Lab Results  Component Value Date   WBC 4.2 02/02/2021   NEUTROABS 2.9 02/02/2021   HGB 8.1 (L) 02/02/2021   HCT 25.9 (L) 02/02/2021   MCV 96.6 02/02/2021   PLT 208 02/02/2021      Chemistry      Component Value Date/Time   NA 140 12/09/2020 1443   NA 142 05/28/2012 1600   K 4.1 12/09/2020 1443   K 3.7 05/28/2012 1600   CL 103 12/09/2020 1443   CL 103 05/28/2012 1600   CO2 31 12/09/2020 1443   CO2 29 05/28/2012 1600   BUN 12 12/09/2020 1443   BUN 19.3 05/28/2012 1600   CREATININE 0.89 12/09/2020 1443   CREATININE 0.91 11/05/2020 1211   CREATININE 0.88 12/06/2019 1147   CREATININE 0.8 05/28/2012 1600      Component Value Date/Time   CALCIUM 9.6 12/09/2020 1443   CALCIUM 9.5 05/28/2012 1600   ALKPHOS 79 12/09/2020 1443   ALKPHOS 82 05/28/2012 1600   AST 25 12/09/2020 1443   AST 23 05/28/2012 1600   ALT 15 12/09/2020 1443   ALT 13 05/28/2012 1600   BILITOT 0.3 12/09/2020 1443   BILITOT <0.20 05/28/2012 1600

## 2021-02-05 ENCOUNTER — Telehealth: Payer: Self-pay

## 2021-02-05 NOTE — Telephone Encounter (Signed)
Called and given below message. She appreciated the call and verbalized understanding. 

## 2021-02-05 NOTE — Telephone Encounter (Signed)
-----  Message from [name], MD sent at 02/05/2021  1:44 PM EDT ----- PLs call her, got monoferric approved I am sure she has met her deductible, if so, all future treatment is covered Pls let her know and call Loren to reduce treatment time

## 2021-02-09 ENCOUNTER — Ambulatory Visit (INDEPENDENT_AMBULATORY_CARE_PROVIDER_SITE_OTHER): Payer: Medicare Other | Admitting: Gastroenterology

## 2021-02-09 ENCOUNTER — Encounter: Payer: Self-pay | Admitting: Gastroenterology

## 2021-02-09 VITALS — BP 162/62 | HR 64 | Ht 67.0 in | Wt 157.2 lb

## 2021-02-09 DIAGNOSIS — K623 Rectal prolapse: Secondary | ICD-10-CM | POA: Diagnosis not present

## 2021-02-09 DIAGNOSIS — K641 Second degree hemorrhoids: Secondary | ICD-10-CM | POA: Diagnosis not present

## 2021-02-09 DIAGNOSIS — K625 Hemorrhage of anus and rectum: Secondary | ICD-10-CM | POA: Diagnosis not present

## 2021-02-09 NOTE — Progress Notes (Signed)
PROCEDURE NOTE: The patient presents with symptomatic grade II-III  hemorrhoids, requesting rubber band ligation of his/her hemorrhoidal disease.  All risks, benefits and alternative forms of therapy were described and informed consent was obtained.   The anorectum was pre-medicated with 0.125% NTG and Recticare The decision was made to band the left lateral internal hemorrhoid, and the [place] was used to perform band ligation without complication.  Digital anorectal examination was then performed to assure proper positioning of the band, and to adjust the banded tissue as required.  The patient was discharged home without pain or other issues.  Dietary and behavioral recommendations were given and along with follow-up instructions.     The following adjunctive treatments were recommended: Kegel's exercise  The patient will return in 2-4 weeks for  follow-up and possible additional banding as required. No complications were encountered and the patient tolerated the procedure well.

## 2021-02-09 NOTE — Patient Instructions (Addendum)
HEMORRHOID BANDING PROCEDURE  ? ? FOLLOW-UP CARE ? ? ?The procedure you have had should have been relatively painless since the banding of the area involved does not have nerve endings and there is no pain sensation.  The rubber band cuts off the blood supply to the hemorrhoid and the band may fall off as soon as 48 hours after the banding (the band may occasionally be seen in the toilet bowl following a bowel movement). You may notice a temporary feeling of fullness in the rectum which should respond adequately to plain Tylenol? or Motrin?. ? ?Following the banding, avoid strenuous exercise that evening and resume full activity the next day.  A sitz bath (soaking in a warm tub) or bidet is soothing, and can be useful for cleansing the area after bowel movements.   ? ? ?To avoid constipation, take two tablespoons of natural wheat bran, natural oat bran, flax, Benefiber? or any over the counter fiber supplement and increase your water intake to 7-8 glasses daily.   ? ?Unless you have been prescribed anorectal medication, do not put anything inside your rectum for two weeks: No suppositories, enemas, fingers, etc. ? ?Occasionally, you may have more bleeding than usual after the banding procedure.  This is often from the untreated hemorrhoids rather than the treated one.  Don?t be concerned if there is a tablespoon or so of blood.  If there is more blood than this, lie flat with your bottom higher than your head and apply an ice pack to the area. If the bleeding does not stop within a half an hour or if you feel faint, call our office at [phone] or go to the emergency room. ? ?Problems are not common; however, if there is a substantial amount of bleeding, severe pain, chills, fever or difficulty passing urine (very rare) or other problems, you should call us at [phone] or report to the nearest emergency room. ? ?Do not stay seated continuously for more than 2-3 hours for a day or two after the procedure.   Tighten your buttock muscles 10-15 times every two hours and take 10-15 deep breaths every 1-2 hours.  Do not spend more than a few minutes on the toilet if you cannot empty your bowel; instead re-visit the toilet at a later time. ? ? I appreciate the  opportunity to care for you ? ?Thank You  ? ?Kavitha Nandigam , MD  ?

## 2021-02-10 ENCOUNTER — Telehealth: Payer: Self-pay | Admitting: Internal Medicine

## 2021-02-10 NOTE — Telephone Encounter (Signed)
Left message for patient to call back and schedule Medicare Annual Wellness Visit (AWV) either virtually or in office. Left  my Belinda Harper number [phone]   Last AWV 12/05/19   please schedule at anytime with [facility] or 2   This should be a 45 minute visit.   ok to schedule appt 9/27/22sent note to dawn h 12/31/20 pt stated she had this done 12/08/20  She only billed for CPE, and missing a few of the bullets that need to be doucmented, she did not bill for AWV.

## 2021-02-11 ENCOUNTER — Telehealth: Payer: Self-pay

## 2021-02-11 ENCOUNTER — Other Ambulatory Visit: Payer: Self-pay | Admitting: Hematology and Oncology

## 2021-02-11 NOTE — Telephone Encounter (Signed)
-----   Message from [name], MD sent at 02/10/2021 10:32 AM EDT ----- Regarding: non urgent With monoferric, she only needs 1 dose Ok to cancel the others after her infusion next week

## 2021-02-11 NOTE — Telephone Encounter (Signed)
Called and given below message. She verbalized understanding. 

## 2021-02-15 ENCOUNTER — Other Ambulatory Visit: Payer: Self-pay

## 2021-02-15 ENCOUNTER — Inpatient Hospital Stay: Payer: Medicare Other

## 2021-02-15 VITALS — BP 116/48 | HR 58 | Temp 98.0°F | Resp 18

## 2021-02-15 DIAGNOSIS — D509 Iron deficiency anemia, unspecified: Secondary | ICD-10-CM

## 2021-02-15 MED ORDER — SODIUM CHLORIDE 0.9 % IV SOLN: Freq: Once | INTRAVENOUS | Status: AC

## 2021-02-15 MED ORDER — SODIUM CHLORIDE 0.9 % IV SOLN
1000.0000 mg | Freq: Once | INTRAVENOUS | Status: AC
  Administered 2021-02-15: 1000 mg via INTRAVENOUS
  Filled 2021-02-15: qty 10

## 2021-02-15 NOTE — Patient Instructions (Signed)

## 2021-02-22 ENCOUNTER — Ambulatory Visit: Payer: Medicare Other

## 2021-02-23 ENCOUNTER — Ambulatory Visit (INDEPENDENT_AMBULATORY_CARE_PROVIDER_SITE_OTHER): Payer: Medicare Other

## 2021-02-23 ENCOUNTER — Other Ambulatory Visit: Payer: Self-pay

## 2021-02-23 DIAGNOSIS — Z0000 Encounter for general adult medical examination without abnormal findings: Secondary | ICD-10-CM | POA: Diagnosis not present

## 2021-02-23 NOTE — Patient Instructions (Signed)
Belinda Harper , Thank you for taking time to come for your Medicare Wellness Visit. I appreciate your ongoing commitment to your health goals. Please review the following plan we discussed and let me know if I can assist you in the future.   Screening recommendations/referrals: Colonoscopy: Done 12/15/20 pt stated no further screening Mammogram: Done 10/22/20 repeat every year  Bone Density: Done 08/05/20 Recommended yearly ophthalmology/optometry visit for glaucoma screening and checkup Recommended yearly dental visit for hygiene and checkup  Vaccinations: Influenza vaccine: Due and discussed Pneumococcal vaccine: Up to date Tdap vaccine: Due Shingles vaccine: Completed 7/10, & 01/04/18   Covid-19:Completed 10/20, 03/04/21  Advanced directives: Advance directive discussed with you today. I have provided a copy for you to complete at home and have notarized. Once this is complete please bring a copy in to our office so we can scan it into your chart.  Conditions/risks identified: Stay healthy  Next appointment: Follow up in one year for your annual wellness visit    Preventive Care 79 Years and Older, Female Preventive care refers to lifestyle choices and visits with your health care provider that can promote health and wellness. What does preventive care include? A yearly physical exam. This is also called an annual well check. Dental exams once or twice a year. Routine eye exams. Ask your health care provider how often you should have your eyes checked. Personal lifestyle choices, including: Daily care of your teeth and gums. Regular physical activity. Eating a healthy diet. Avoiding tobacco and drug use. Limiting alcohol use. Practicing safe sex. Taking low-dose aspirin every day. Taking vitamin and mineral supplements as recommended by your health care provider. What happens during an annual well check? The services and screenings done by your health care provider during your  annual well check will depend on your age, overall health, lifestyle risk factors, and family history of disease. Counseling  Your health care provider may ask you questions about your: Alcohol use. Tobacco use. Drug use. Emotional well-being. Home and relationship well-being. Sexual activity. Eating habits. History of falls. Memory and ability to understand (cognition). Work and work Statistician. Reproductive health. Screening  You may have the following tests or measurements: Height, weight, and BMI. Blood pressure. Lipid and cholesterol levels. These may be checked every 5 years, or more frequently if you are over 17 years old. Skin check. Lung cancer screening. You may have this screening every year starting at age 79 if you have a 30-pack-year history of smoking and currently smoke or have quit within the past 15 years. Fecal occult blood test (FOBT) of the stool. You may have this test every year starting at age 79. Flexible sigmoidoscopy or colonoscopy. You may have a sigmoidoscopy every 5 years or a colonoscopy every 10 years starting at age 79. Hepatitis C blood test. Hepatitis B blood test. Sexually transmitted disease (STD) testing. Diabetes screening. This is done by checking your blood sugar (glucose) after you have not eaten for a while (fasting). You may have this done every 1-3 years. Bone density scan. This is done to screen for osteoporosis. You may have this done starting at age 79. Mammogram. This may be done every 1-2 years. Talk to your health care provider about how often you should have regular mammograms. Talk with your health care provider about your test results, treatment options, and if necessary, the need for more tests. Vaccines  Your health care provider may recommend certain vaccines, such as: Influenza vaccine. This is recommended every year.  Tetanus, diphtheria, and acellular pertussis (Tdap, Td) vaccine. You may need a Td booster every 10  years. Zoster vaccine. You may need this after age 79. Pneumococcal 13-valent conjugate (PCV13) vaccine. One dose is recommended after age 79. Pneumococcal polysaccharide (PPSV23) vaccine. One dose is recommended after age 79. Talk to your health care provider about which screenings and vaccines you need and how often you need them. This information is not intended to replace advice given to you by your health care provider. Make sure you discuss any questions you have with your health care provider. Document Released: 05/08/2015 Document Revised: 12/30/2015 Document Reviewed: 02/10/2015 Elsevier Interactive Patient Education  2017 [place] Prevention in the Home Falls can cause injuries. They can happen to people of all ages. There are many things you can do to make your home safe and to help prevent falls. What can I do on the outside of my home? Regularly fix the edges of walkways and driveways and fix any cracks. Remove anything that might make you trip as you walk through a door, such as a raised step or threshold. Trim any bushes or trees on the path to your home. Use bright outdoor lighting. Clear any walking paths of anything that might make someone trip, such as rocks or tools. Regularly check to see if handrails are loose or broken. Make sure that both sides of any steps have handrails. Any raised decks and porches should have guardrails on the edges. Have any leaves, snow, or ice cleared regularly. Use sand or salt on walking paths during winter. Clean up any spills in your garage right away. This includes oil or grease spills. What can I do in the bathroom? Use night lights. Install grab bars by the toilet and in the tub and shower. Do not use towel bars as grab bars. Use non-skid mats or decals in the tub or shower. If you need to sit down in the shower, use a plastic, non-slip stool. Keep the floor dry. Clean up any water that spills on the floor as soon as it  happens. Remove soap buildup in the tub or shower regularly. Attach bath mats securely with double-sided non-slip rug tape. Do not have throw rugs and other things on the floor that can make you trip. What can I do in the bedroom? Use night lights. Make sure that you have a light by your bed that is easy to reach. Do not use any sheets or blankets that are too big for your bed. They should not hang down onto the floor. Have a firm chair that has side arms. You can use this for support while you get dressed. Do not have throw rugs and other things on the floor that can make you trip. What can I do in the kitchen? Clean up any spills right away. Avoid walking on wet floors. Keep items that you use a lot in easy-to-reach places. If you need to reach something above you, use a strong step stool that has a grab bar. Keep electrical cords out of the way. Do not use floor polish or wax that makes floors slippery. If you must use wax, use non-skid floor wax. Do not have throw rugs and other things on the floor that can make you trip. What can I do with my stairs? Do not leave any items on the stairs. Make sure that there are handrails on both sides of the stairs and use them. Fix handrails that are broken or loose.  Make sure that handrails are as long as the stairways. Check any carpeting to make sure that it is firmly attached to the stairs. Fix any carpet that is loose or worn. Avoid having throw rugs at the top or bottom of the stairs. If you do have throw rugs, attach them to the floor with carpet tape. Make sure that you have a light switch at the top of the stairs and the bottom of the stairs. If you do not have them, ask someone to add them for you. What else can I do to help prevent falls? Wear shoes that: Do not have high heels. Have rubber bottoms. Are comfortable and fit you well. Are closed at the toe. Do not wear sandals. If you use a stepladder: Make sure that it is fully opened.  Do not climb a closed stepladder. Make sure that both sides of the stepladder are locked into place. Ask someone to hold it for you, if possible. Clearly mark and make sure that you can see: Any grab bars or handrails. First and last steps. Where the edge of each step is. Use tools that help you move around (mobility aids) if they are needed. These include: Canes. Walkers. Scooters. Crutches. Turn on the lights when you go into a dark area. Replace any light bulbs as soon as they burn out. Set up your furniture so you have a clear path. Avoid moving your furniture around. If any of your floors are uneven, fix them. If there are any pets around you, be aware of where they are. Review your medicines with your doctor. Some medicines can make you feel dizzy. This can increase your chance of falling. Ask your doctor what other things that you can do to help prevent falls. This information is not intended to replace advice given to you by your health care provider. Make sure you discuss any questions you have with your health care provider. Document Released: 02/05/2009 Document Revised: 09/17/2015 Document Reviewed: 05/16/2014 Elsevier Interactive Patient Education  2017 [employer].

## 2021-02-23 NOTE — Progress Notes (Signed)
Virtual Visit via Telephone Note  I connected with  Belinda Harper on 02/23/21 at  3:15 PM EDT by telephone and verified that I am speaking with the correct person using two identifiers.  Medicare Annual Wellness visit completed telephonically due to Covid-19 pandemic.   Persons participating in this call: This Health Coach and this patient.   Location: Patient: Home Provider: Office   I discussed the limitations, risks, security and privacy concerns of performing an evaluation and management service by telephone and the availability of in person appointments. The patient expressed understanding and agreed to proceed.  Unable to perform video visit due to video visit attempted and failed and/or patient does not have video capability.   Some vital signs may be absent or patient reported.   Willette Brace, LPN   Subjective:   Belinda Harper is a 80 y.o. female who presents for Medicare Annual (Subsequent) preventive examination.  Review of Systems     Cardiac Risk Factors include: advanced age (>62men, >18 women)     Objective:    There were no vitals filed for this visit. There is no height or weight on file to calculate BMI.  Advanced Directives 02/23/2021 01/07/2021 08/11/2020 07/07/2020 06/30/2020 06/23/2020 12/26/2019  Does Patient Have a Medical Advance Directive? No No No No No No No  Would patient like information on creating a medical advance directive? Yes (MAU/Ambulatory/Procedural Areas - Information given) No - Patient declined No - Patient declined No - Patient declined No - Patient declined No - Patient declined No - Patient declined    Current Medications (verified) Outpatient Encounter Medications as of 02/23/2021  Medication Sig   amLODipine (NORVASC) 5 MG tablet TAKE 1 TABLET (5 MG TOTAL) BY MOUTH DAILY.   Calcium Citrate-Vitamin D (CITRACAL + D PO) Take 1 tablet by mouth daily with lunch. 500 mg calcium (2 X 250 mg)   Cholecalciferol (VITAMIN D3 PO) Take 1,000 Units  by mouth daily.   magnesium oxide (MAG-OX) 400 MG tablet Take 400 mg by mouth daily.   Menatetrenone (VITAMIN K2) 100 MCG TABS Take 800 mcg by mouth daily.   metoprolol succinate (TOPROL-XL) 25 MG 24 hr tablet Take 0.5 tablets (12.5 mg total) by mouth at bedtime.   Multiple Vitamin (MULTIVITAMIN WITH MINERALS) TABS tablet Take 1 tablet by mouth daily. One a Day - Women's   pantoprazole (PROTONIX) 40 MG tablet Take 40 mg by mouth daily for 3 months.   vitamin C (ASCORBIC ACID) 500 MG tablet Take 500 mg by mouth daily.   No facility-administered encounter medications on file as of 02/23/2021.    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   Anxiety    ASTHMA 01/03/2007   Blood transfusion 12 2010   hg 4 transfused 4 units   DEPRESSION 01/03/2007   resolved   Family history of adverse reaction to anesthesia    pt's mother as she gets older organs shut down    FRACTURE, ANKLE, LEFT 04/10/2009   Gastritis 2002   GERD (gastroesophageal reflux disease)    HEMORRHOIDS 05/19/2008   HYPERCHOLESTEROLEMIA 05/19/2008   Normal cardiac stress test    2014   OSTEOARTHRITIS 01/03/2007   OSTEOPOROSIS 01/03/2007   hx of actonbel use for at least 5 years    Sepsis due to cellulitis ([place]) 02/05/2015  Unspecified vitamin D deficiency 04/06/2007   UTERINE PROLAPSE 04/06/2007   Past Surgical History:  Procedure Laterality Date   ANKLE FRACTURE SURGERY  02/2009   left ankle fracture and dislocation   CATARACT EXTRACTION Left    last year    CATARACT EXTRACTION W/ INTRAOCULAR LENS IMPLANT  2000   right eye   HARDWARE REMOVAL Left 02/21/2015   Procedure: IRRIGATION AND DEBRIDEMENT HARDWARE REMOVAL LEFT ANKLE ;  Surgeon: Gaynelle Arabian, MD;  Location: [facility];  Service: Orthopedics;  Laterality: Left;    MASS EXCISION Left 09/11/2012   Procedure: 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; Rotation Flap;  Surgeon: Wynonia Sours, MD;  Location: [facility];  Service: Orthopedics;  Laterality: Left;   ORIF FINGER / [place]   reattached   PANENDOSCOPY     small bowel bx   Family History  Problem Relation Age of Onset   Hyperlipidemia Mother    Heart disease Mother        CABG age 15   Ovarian cancer Mother    Uterine cancer Mother    Arthritis Mother    Osteoporosis Mother    Heart disease Father        CABG age 78   Arthritis Father    Melanoma Father    Colon cancer Neg Hx    Esophageal cancer Neg Hx    Rectal cancer Neg Hx    Stomach cancer Neg Hx    Social History   Socioeconomic History   Marital status: Divorced    Spouse name: Not on file   Number of children: 2   Years of education: Not on file   Highest education level: Not on file  Occupational History   Occupation: retired  Tobacco Use   Smoking status: Never   Smokeless tobacco: Never  Vaping Use   Vaping Use: Never used  Substance and Sexual Activity   Alcohol use: Not Currently   Drug use: No   Sexual activity: Not Currently    Birth control/protection: Post-menopausal    Comment: 1st intercourse- 18, partners- 1   Other Topics Concern   Not on file  Social History Narrative   HH of  1   To rent out room    Retired Dispensing optician   Never smoked    Pet cat allergic    Divorced   Regular exericise  walking mowing the lawnswimming   Silver sneakers    Social Determinants of Radio broadcast assistant Strain: Low Risk    Difficulty of Paying Living Expenses: Not hard at all  Food Insecurity: No Food Insecurity   Worried About Charity fundraiser in the Last Year: Never true   Arboriculturist in the Last Year: Never true  Transportation Needs: No Transportation Needs   Lack of Transportation (Medical): No   Lack of Transportation (Non-Medical): No   Physical Activity: Inactive   Days of Exercise per Week: 0 days   Minutes of Exercise per Session: 0 min  Stress: No Stress Concern Present   Feeling of Stress : Not at all  Social Connections: Moderately Integrated   Frequency of Communication with Friends and Family: More than three times a week   Frequency of Social Gatherings with Friends and Family: More than three times a week   Attends Religious Services: More than 4 times per year   Active Member of [employer] or Organizations: Yes   Attends Archivist  Meetings: 1 to 4 times per year   Marital Status: Divorced    Tobacco Counseling Counseling given: Not Answered   Clinical Intake:  Pre-visit preparation completed: Yes  Pain : No/denies pain     BMI - recorded: 24.63 Nutritional Status: BMI of 19-24  Normal Diabetes: No  How often do you need to have someone help you when you read instructions, pamphlets, or other written materials from your doctor or pharmacy?: 1 - Never  Diabetic?no  Interpreter Needed?: No  Information entered by :: Charlott Rakes, LPN   Activities of Daily Living In your present state of health, do you have any difficulty performing the following activities: 02/23/2021  Hearing? Y  Comment HOH  Vision? N  Difficulty concentrating or making decisions? N  Walking or climbing stairs? Y  Dressing or bathing? N  Doing errands, shopping? N  Preparing Food and eating ? N  Using the Toilet? N  In the past six months, have you accidently leaked urine? N  Do you have problems with loss of bowel control? N  Managing your Medications? N  Managing your Finances? N  Housekeeping or managing your Housekeeping? N  Some recent data might be hidden    Patient Care Team: Panosh, Standley Brooking, MD as PCP - General Hilty, Nadean Corwin, MD as PCP - Cardiology (Cardiology) Servando Salina, MD (Obstetrics and Gynecology) Clent Jacks, MD (Ophthalmology) Mikey Bussing, DDS (Dentistry) Druscilla Brownie, MD as Consulting Physician (Dermatology) [name], MD as Consulting Physician (Hematology and Oncology) Gaynelle Arabian, MD as Consulting Physician (Orthopedic Surgery)  Indicate any recent Medical Services you may have received from other than [facility] providers in the past year (date may be approximate).     Assessment:   This is a routine wellness examination for Belinda Harper.  Hearing/Vision screen Hearing Screening - Comments:: Pt stated HOH  Vision Screening - Comments:: Pt follows up with Dr Katy Fitch for annual eye exams   Dietary issues and exercise activities discussed: Current Exercise Habits: The patient does not participate in regular exercise at present (house work and light gardening)   Goals Addressed             This Visit's Progress    Patient Stated       Stay healthy       Depression Screen PHQ 2/9 Scores 02/23/2021 06/10/2020 12/05/2019 11/21/2018 10/20/2017 10/18/2016 10/07/2015  PHQ - 2 Score 0 0 0 0 0 0 0  PHQ- 9 Score - - 0 - - - -    Fall Risk Fall Risk  02/23/2021 06/10/2020 12/05/2019 11/21/2018 10/20/2017  Falls in the past year? 1 1 1 1  Yes  Comment - - - - fell last week; in her driveway on concrete   Number falls in past yr: 1 1 1 1 2  or more  Comment - - - - -  Injury with Fall? 1 1 0 0 -  Comment FX a couple ribs - - - -  Risk for fall due to : Impaired vision;Impaired balance/gait;Impaired mobility History of fall(s) History of fall(s);Impaired balance/gait - History of fall(s)  Follow up Falls prevention discussed Falls evaluation completed Falls evaluation completed;Falls prevention discussed Falls evaluation completed Education provided    FALL RISK PREVENTION PERTAINING TO THE HOME:  Any stairs in or around the home? Yes  If so, are there any without handrails? No  Home free of loose throw rugs in walkways, pet beds, electrical cords, etc? Yes  Adequate lighting in your home to  reduce risk of falls? Yes   ASSISTIVE DEVICES UTILIZED TO  PREVENT FALLS:  Life alert? No  Use of a cane, walker or w/c? Yes  Grab bars in the bathroom? No  Shower chair or bench in shower? No  Elevated toilet seat or a handicapped toilet? No   TIMED UP AND GO:  Was the test performed? No .   Cognitive Function: MMSE - Mini Mental State Exam 10/20/2017  Orientation to time 5  Orientation to Place 5  Registration 3  Attention/ Calculation 5  Recall 3  Language- name 2 objects 2  Language- repeat 1  Language- follow 3 step command 3  Language- read & follow direction 1  Write a sentence 1  Copy design 1  Total score 30     6CIT Screen 02/23/2021 12/05/2019  What Year? 0 points 0 points  What month? 0 points 0 points  What time? 0 points 0 points  Count back from 20 0 points 0 points  Months in reverse 0 points 0 points  Repeat phrase 2 points 2 points  Total Score 2 2    Immunizations Immunization History  Administered Date(s) Administered   Influenza Whole 03/10/2009, 01/27/2010   Influenza, High Dose Seasonal PF 03/23/2016, 04/24/2017, 02/09/2018, 03/19/2019   Influenza,inj,Quad PF,6+ Mos 04/08/2020   PFIZER(Purple Top)SARS-COV-2 Vaccination 02/12/2020, 03/04/2020   Pneumococcal Conjugate-13 12/18/2013   Pneumococcal Polysaccharide-23 04/25/2006, 10/18/2012   Td 04/25/1997, 06/22/2009   Zoster Recombinat (Shingrix) 11/01/2017, 01/04/2018   Zoster, Live 06/28/2010      Flu Vaccine status: Due, Education has been provided regarding the importance of this vaccine. Advised may receive this vaccine at local pharmacy or Health Dept. Aware to provide a copy of the vaccination record if obtained from local pharmacy or Health Dept. Verbalized acceptance and understanding.  Pneumococcal vaccine status: Up to date  Covid-19 vaccine status: Completed vaccines  Qualifies for Shingles Vaccine? Yes   Zostavax completed Yes   Shingrix Completed?: Yes  Screening Tests Health Maintenance  Topic Date Due   Hepatitis C Screening   Never done   TETANUS/TDAP  06/23/2019   COVID-19 Vaccine (3 - Pfizer risk series) 04/01/2020   INFLUENZA VACCINE  11/23/2020   Pneumonia Vaccine 26+ Years old  Completed   DEXA SCAN  Completed   Zoster Vaccines- Shingrix  Completed   HPV VACCINES  Aged Out    Health Maintenance  Health Maintenance Due  Topic Date Due   Hepatitis C Screening  Never done   TETANUS/TDAP  06/23/2019   COVID-19 Vaccine (3 - Pfizer risk series) 04/01/2020   INFLUENZA VACCINE  11/23/2020    Colorectal cancer screening: No longer required.   Mammogram status: Completed 10/22/20. Repeat every year  Bone Density status: Completed 10/17/19. Results reflect: Bone density results: OSTEOPOROSIS. Repeat every 2 years.  Additional Screening:  Hepatitis C Screening: does qualify;   Vision Screening: Recommended annual ophthalmology exams for early detection of glaucoma and other disorders of the eye. Is the patient up to date with their annual eye exam?  Yes  Who is the provider or what is the name of the office in which the patient attends annual eye exams? Dr Katy Fitch  If pt is not established with a provider, would they like to be referred to a provider to establish care? No .   Dental Screening: Recommended annual dental exams for proper oral hygiene  Community Resource Referral / Chronic Care Management: CRR required this visit?  No   CCM required this  visit?  No      Plan:     I have personally reviewed and noted the following in the patient's chart:   Medical and social history Use of alcohol, tobacco or illicit drugs  Current medications and supplements including opioid prescriptions.  Functional ability and status Nutritional status Physical activity Advanced directives List of other physicians Hospitalizations, surgeries, and ER visits in previous 12 months Vitals Screenings to include cognitive, depression, and falls Referrals and appointments  In addition, I have reviewed and  discussed with patient certain preventive protocols, quality metrics, and best practice recommendations. A written personalized care plan for preventive services as well as general preventive health recommendations were provided to patient.     Willette Brace, LPN   15/12/4705   Nurse Notes: None

## 2021-03-01 ENCOUNTER — Ambulatory Visit: Payer: Medicare Other

## 2021-03-09 ENCOUNTER — Ambulatory Visit (INDEPENDENT_AMBULATORY_CARE_PROVIDER_SITE_OTHER): Payer: Medicare Other | Admitting: Internal Medicine

## 2021-03-09 ENCOUNTER — Encounter: Payer: Self-pay | Admitting: Internal Medicine

## 2021-03-09 VITALS — BP 154/70 | HR 60 | Temp 98.4°F | Ht 67.0 in | Wt 158.2 lb

## 2021-03-09 DIAGNOSIS — Z8781 Personal history of (healed) traumatic fracture: Secondary | ICD-10-CM | POA: Diagnosis not present

## 2021-03-09 DIAGNOSIS — Z79899 Other long term (current) drug therapy: Secondary | ICD-10-CM

## 2021-03-09 DIAGNOSIS — D509 Iron deficiency anemia, unspecified: Secondary | ICD-10-CM

## 2021-03-09 DIAGNOSIS — M810 Age-related osteoporosis without current pathological fracture: Secondary | ICD-10-CM

## 2021-03-09 NOTE — Progress Notes (Signed)
Chief Complaint  Patient presents with   Follow-up     HPI: Belinda Harper 80 y.o. come in for fu after fall in spetember and sustained rib fx  in interim   more iron infusions  via heme and procedure for hemorrhoids per dr Silverio Decamp   She is recovered from her rib fractures although is hard to sleep on the right side she is willing to consider taking Prolia at this point taking vitamin D 8000 international units a day and calcium. See past history had been on Actonel for about 5 years may have had some side effects.  Last vitamin D and chemistry within the last year last DEXA scan 2021. ROS: See pertinent positives and negatives per HPI.  One a day and  vit d3 1000  and one a day.   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   ALLERGIC RHINITIS 01/03/2007   ANEMIA-IRON DEFICIENCY 01/03/2007   Anxiety    ASTHMA 01/03/2007   Blood transfusion 12 2010   hg 4 transfused 4 units   DEPRESSION 01/03/2007   resolved   Family history of adverse reaction to anesthesia    pt's mother as she gets older organs shut down    FRACTURE, ANKLE, LEFT 04/10/2009   Gastritis 2002   GERD (gastroesophageal reflux disease)    HEMORRHOIDS 05/19/2008   HYPERCHOLESTEROLEMIA 05/19/2008   Normal cardiac stress test    2014   OSTEOARTHRITIS 01/03/2007   OSTEOPOROSIS 01/03/2007   hx of actonbel use for at least 5 years    Sepsis due to cellulitis ([place]) 02/05/2015   Unspecified vitamin D deficiency 04/06/2007   UTERINE PROLAPSE 04/06/2007    Family History  Problem Relation Age of Onset   Hyperlipidemia Mother    Heart disease Mother        CABG age 60   Ovarian cancer Mother    Uterine cancer Mother    Arthritis Mother    Osteoporosis Mother    Heart disease Father        CABG age 46   Arthritis Father    Melanoma Father    Colon cancer Neg Hx    Esophageal cancer Neg Hx    Rectal cancer Neg Hx    Stomach  cancer Neg Hx     Social History   Socioeconomic History   Marital status: Divorced    Spouse name: Not on file   Number of children: 2   Years of education: Not on file   Highest education level: Not on file  Occupational History   Occupation: retired  Tobacco Use   Smoking status: Never   Smokeless tobacco: Never  Vaping Use   Vaping Use: Never used  Substance and Sexual Activity   Alcohol use: Not Currently   Drug use: No   Sexual activity: Not Currently    Birth control/protection: Post-menopausal    Comment: 1st intercourse- 18, partners- 1   Other Topics Concern   Not on file  Social History Narrative   HH of  1   To rent out room    Retired Dispensing optician   Never smoked    Pet cat allergic    Divorced   Regular exericise  walking mowing the lawnswimming   Silver sneakers    Social Determinants of Health   Financial Resource Strain: Low Risk    Difficulty of Paying Living  Expenses: Not hard at all  Food Insecurity: No Food Insecurity   Worried About Charity fundraiser in the Last Year: Never true   Ran Out of Food in the Last Year: Never true  Transportation Needs: No Transportation Needs   Lack of Transportation (Medical): No   Lack of Transportation (Non-Medical): No  Physical Activity: Inactive   Days of Exercise per Week: 0 days   Minutes of Exercise per Session: 0 min  Stress: No Stress Concern Present   Feeling of Stress : Not at all  Social Connections: Moderately Integrated   Frequency of Communication with Friends and Family: More than three times a week   Frequency of Social Gatherings with Friends and Family: More than three times a week   Attends Religious Services: More than 4 times per year   Active Member of Clubs or Organizations: Yes   Attends Archivist Meetings: 1 to 4 times per year   Marital Status: Divorced    Outpatient Medications Prior to Visit  Medication Sig Dispense Refill   amLODipine (NORVASC) 5 MG tablet  TAKE 1 TABLET (5 MG TOTAL) BY MOUTH DAILY. 90 tablet 3   Calcium Citrate-Vitamin D (CITRACAL + D PO) Take 1 tablet by mouth daily with lunch. 500 mg calcium (2 X 250 mg)     Cholecalciferol (VITAMIN D3 PO) Take 1,000 Units by mouth daily.     magnesium oxide (MAG-OX) 400 MG tablet Take 400 mg by mouth daily.     Menatetrenone (VITAMIN K2) 100 MCG TABS Take 800 mcg by mouth daily.     metoprolol succinate (TOPROL-XL) 25 MG 24 hr tablet Take 0.5 tablets (12.5 mg total) by mouth at bedtime. 45 tablet 3   Multiple Vitamin (MULTIVITAMIN WITH MINERALS) TABS tablet Take 1 tablet by mouth daily. One a Day - Women's     pantoprazole (PROTONIX) 40 MG tablet Take 40 mg by mouth daily for 3 months. 90 tablet 0   vitamin C (ASCORBIC ACID) 500 MG tablet Take 500 mg by mouth daily.     No facility-administered medications prior to visit.     EXAM:  BP (!) 154/70 (BP Location: Left Arm, Patient Position: Sitting, Cuff Size: Normal)   Pulse 60   Temp 98.4 F (36.9 C) (Oral)   Ht 5\' 7"  (1.702 m)   Wt 158 lb 3.2 oz (71.8 kg)   SpO2 95%   BMI 24.78 kg/m   Body mass index is 24.78 kg/m.  GENERAL: vitals reviewed and listed above, alert, oriented, appears well hydrated and in no acute distress HEENT: atraumatic, conjunctiva  clear, no obvious abnormalities on inspection of external nose and ears OP : Masked NECK: no obvious masses on inspection palpation  LUNGS: clear to auscultation bilaterally, no wheezes, rales or rhonchi, good air movement minimally tender right lower lateral rib cage no bruising abnormality CV: HRRR, no clubbing cyanosis or  peripheral edema nl cap refill  MS: moves all extremities without noticeable focal  abnormality PSYCH: pleasant and cooperative, no obvious depression or anxiety  BP Readings from Last 3 Encounters:  03/09/21 (!) 154/70  02/15/21 (!) 116/48  02/09/21 (!) 162/62    ASSESSMENT AND PLAN:  Discussed the following assessment and plan:  Osteoporosis,  unspecified osteoporosis type, unspecified pathological fracture presence  History of fracture due to fall  Medication management  Iron deficiency anemia, unspecified iron deficiency anemia type Rib fracture healing clinically  no complication Discussion plan referral to get Prolia begun.  Uncertain  if she will need repeat chemistries or bone density.  Patient aware. -Patient advised to return or notify health care team  if  new concerns arise. Review   plan disc   30 minutes  Patient Instructions  Will arrange for  prolia  injections.   You should be contacted about this after approved.    Continue the vit d in interim .   Will be notified if need  fu dexa or lab .    Standley Brooking. Zacchaeus Halm M.D.

## 2021-03-09 NOTE — Patient Instructions (Signed)
Will arrange for  prolia  injections.   You should be contacted about this after approved.    Continue the vit d in interim .   Will be notified if need  fu dexa or lab .

## 2021-03-10 ENCOUNTER — Ambulatory Visit: Payer: Medicare Other | Admitting: Internal Medicine

## 2021-03-10 NOTE — Progress Notes (Signed)
Prolia process started. Benefits verification pending.

## 2021-03-12 ENCOUNTER — Ambulatory Visit (INDEPENDENT_AMBULATORY_CARE_PROVIDER_SITE_OTHER): Payer: Medicare Other | Admitting: Gastroenterology

## 2021-03-12 ENCOUNTER — Encounter: Payer: Self-pay | Admitting: Gastroenterology

## 2021-03-12 VITALS — BP 146/60 | HR 72 | Ht 66.0 in | Wt 156.4 lb

## 2021-03-12 DIAGNOSIS — K642 Third degree hemorrhoids: Secondary | ICD-10-CM | POA: Diagnosis not present

## 2021-03-12 NOTE — Patient Instructions (Addendum)
HEMORRHOID BANDING PROCEDURE    FOLLOW-UP CARE   The procedure you have had should have been relatively painless since the banding of the area involved does not have nerve endings and there is no pain sensation.  The rubber band cuts off the blood supply to the hemorrhoid and the band may fall off as soon as 48 hours after the banding (the band may occasionally be seen in the toilet bowl following a bowel movement). You may notice a temporary feeling of fullness in the rectum which should respond adequately to plain Tylenol or Motrin.  Following the banding, avoid strenuous exercise that evening and resume full activity the next day.  A sitz bath (soaking in a warm tub) or bidet is soothing, and can be useful for cleansing the area after bowel movements.     To avoid constipation, take two tablespoons of natural wheat bran, natural oat bran, flax, Benefiber or any over the counter fiber supplement and increase your water intake to 7-8 glasses daily.    Unless you have been prescribed anorectal medication, do not put anything inside your rectum for two weeks: No suppositories, enemas, fingers, etc.  Occasionally, you may have more bleeding than usual after the banding procedure.  This is often from the untreated hemorrhoids rather than the treated one.  Don't be concerned if there is a tablespoon or so of blood.  If there is more blood than this, lie flat with your bottom higher than your head and apply an ice pack to the area. If the bleeding does not stop within a half an hour or if you feel faint, call our office at [phone] or go to the emergency room.  Problems are not common; however, if there is a substantial amount of bleeding, severe pain, chills, fever or difficulty passing urine (very rare) or other problems, you should call us at [phone] or report to the nearest emergency room.  Do not stay seated continuously for more than 2-3 hours for a day or two after the procedure.   Tighten your buttock muscles 10-15 times every two hours and take 10-15 deep breaths every 1-2 hours.  Do not spend more than a few minutes on the toilet if you cannot empty your bowel; instead re-visit the toilet at a later time.   If you are age 81 or older, your body mass index should be between 23-30. Your Body mass index is 25.24 kg/m. If this is out of the aforementioned range listed, please consider follow up with your Primary Care Provider.  If you are age 48 or younger, your body mass index should be between 19-25. Your Body mass index is 25.24 kg/m. If this is out of the aformentioned range listed, please consider follow up with your Primary Care Provider.   ________________________________________________________  The [place] [facility] providers would like to encourage you to use [facility] to communicate with providers for non-urgent requests or questions.  Due to long hold times on the telephone, sending your provider a message by [facility] may be a faster and more efficient way to get a response.  Please allow 48 business hours for a response.  Please remember that this is for non-urgent requests.  _______________________________________________________   I appreciate the  opportunity to care for you  Thank You   Harl Bowie , MD

## 2021-03-12 NOTE — Progress Notes (Signed)
PROCEDURE NOTE: The patient presents with symptomatic grade II-III  hemorrhoids, requesting rubber band ligation of his/her hemorrhoidal disease.  All risks, benefits and alternative forms of therapy were described and informed consent was obtained.     The anorectum was pre-medicated with 0.125% NTG and Recticare The decision was made to band the right posterior internal hemorrhoid, and the [place] was used to perform band ligation without complication.  Digital anorectal examination was then performed to assure proper positioning of the band, and to adjust the banded tissue as required.  The patient was discharged home without pain or other issues.  Dietary and behavioral recommendations were given and along with follow-up instructions.     The following adjunctive treatments were recommended: Kegel's exercise   The patient will return in 2-4 weeks for  follow-up and possible additional banding as required. No complications were encountered and the patient tolerated the procedure well.  Damaris Hippo , MD [phone]

## 2021-03-23 ENCOUNTER — Other Ambulatory Visit: Payer: Self-pay

## 2021-03-23 ENCOUNTER — Inpatient Hospital Stay: Payer: Medicare Other

## 2021-03-23 ENCOUNTER — Inpatient Hospital Stay: Payer: Medicare Other | Attending: Hematology and Oncology

## 2021-03-23 ENCOUNTER — Inpatient Hospital Stay (HOSPITAL_BASED_OUTPATIENT_CLINIC_OR_DEPARTMENT_OTHER): Payer: Medicare Other | Admitting: Hematology and Oncology

## 2021-03-23 VITALS — BP 164/60 | HR 63 | Temp 97.8°F | Resp 18 | Ht 66.0 in | Wt 155.2 lb

## 2021-03-23 VITALS — BP 142/60 | HR 60 | Temp 98.3°F | Resp 18

## 2021-03-23 DIAGNOSIS — D509 Iron deficiency anemia, unspecified: Secondary | ICD-10-CM

## 2021-03-23 DIAGNOSIS — D500 Iron deficiency anemia secondary to blood loss (chronic): Secondary | ICD-10-CM

## 2021-03-23 DIAGNOSIS — D539 Nutritional anemia, unspecified: Secondary | ICD-10-CM

## 2021-03-23 DIAGNOSIS — D649 Anemia, unspecified: Secondary | ICD-10-CM

## 2021-03-23 DIAGNOSIS — Z79899 Other long term (current) drug therapy: Secondary | ICD-10-CM | POA: Diagnosis not present

## 2021-03-23 LAB — IRON AND TIBC
Iron: 35 ug/dL (ref 28–170)
Saturation Ratios: 12 % (ref 10.4–31.8)
TIBC: 284 ug/dL (ref 250–450)
UIBC: 249 ug/dL

## 2021-03-23 LAB — CBC WITH DIFFERENTIAL/PLATELET
Abs Immature Granulocytes: 0.01 10*3/uL (ref 0.00–0.07)
Basophils Absolute: 0 10*3/uL (ref 0.0–0.1)
Basophils Relative: 1 %
Eosinophils Absolute: 0.1 10*3/uL (ref 0.0–0.5)
Eosinophils Relative: 4 %
HCT: 35.2 % — ABNORMAL LOW (ref 36.0–46.0)
Hemoglobin: 10.6 g/dL — ABNORMAL LOW (ref 12.0–15.0)
Immature Granulocytes: 0 %
Lymphocytes Relative: 33 %
Lymphs Abs: 1.1 10*3/uL (ref 0.7–4.0)
MCH: 28 pg (ref 26.0–34.0)
MCHC: 30.1 g/dL (ref 30.0–36.0)
MCV: 93.1 fL (ref 80.0–100.0)
Monocytes Absolute: 0.3 10*3/uL (ref 0.1–1.0)
Monocytes Relative: 9 %
Neutro Abs: 1.9 10*3/uL (ref 1.7–7.7)
Neutrophils Relative %: 53 %
Platelets: 221 10*3/uL (ref 150–400)
RBC: 3.78 MIL/uL — ABNORMAL LOW (ref 3.87–5.11)
RDW: 16.5 % — ABNORMAL HIGH (ref 11.5–15.5)
WBC: 3.5 10*3/uL — ABNORMAL LOW (ref 4.0–10.5)
nRBC: 0 % (ref 0.0–0.2)

## 2021-03-23 LAB — FERRITIN: Ferritin: 275 ng/mL (ref 11–307)

## 2021-03-23 LAB — SAMPLE TO BLOOD BANK

## 2021-03-23 MED ORDER — SODIUM CHLORIDE 0.9 % IV SOLN: Freq: Once | INTRAVENOUS | Status: AC

## 2021-03-23 MED ORDER — SODIUM CHLORIDE 0.9 % IV SOLN
1000.0000 mg | Freq: Once | INTRAVENOUS | Status: AC
  Administered 2021-03-23: 1000 mg via INTRAVENOUS
  Filled 2021-03-23: qty 10

## 2021-03-23 NOTE — Patient Instructions (Signed)

## 2021-03-24 ENCOUNTER — Encounter: Payer: Self-pay | Admitting: Hematology and Oncology

## 2021-03-24 NOTE — Assessment & Plan Note (Signed)
The patient has significant history of chronic GI bleed causing severe iron deficiency After recent Monoferric iron treatment, she has significant improvement I recommend another dose of Monoferric and to correction of her anemia In her next visit in January, I plan to order additional work-up to rule out other causes The most likely cause of her anemia is due to chronic blood loss/malabsorption syndrome. We discussed some of the risks, benefits, and alternatives of intravenous iron infusions. The patient is symptomatic from anemia and the iron level is critically low. She tolerated oral iron supplement poorly and desires to achieved higher levels of iron faster for adequate hematopoesis. Some of the side-effects to be expected including risks of infusion reactions, phlebitis, headaches, nausea and fatigue.  The patient is willing to proceed. Patient education material was dispensed.  Goal is to keep ferritin level greater than 50 and resolution of anemia

## 2021-03-24 NOTE — Progress Notes (Signed)
[place] OFFICE PROGRESS NOTE  Panosh, Standley Brooking, MD  ASSESSMENT & PLAN:  Iron deficiency anemia The patient has significant history of chronic GI bleed causing severe iron deficiency After recent Monoferric iron treatment, she has significant improvement I recommend another dose of Monoferric and to correction of her anemia In her next visit in January, I plan to order additional work-up to rule out other causes The most likely cause of her anemia is due to chronic blood loss/malabsorption syndrome. We discussed some of the risks, benefits, and alternatives of intravenous iron infusions. The patient is symptomatic from anemia and the iron level is critically low. She tolerated oral iron supplement poorly and desires to achieved higher levels of iron faster for adequate hematopoesis. Some of the side-effects to be expected including risks of infusion reactions, phlebitis, headaches, nausea and fatigue.  The patient is willing to proceed. Patient education material was dispensed.  Goal is to keep ferritin level greater than 50 and resolution of anemia   Orders Placed This Encounter  Procedures   Reticulocytes    Standing Status:   Future    Standing Expiration Date:   03/24/2022   Erythropoietin    Standing Status:   Future    Standing Expiration Date:   03/24/2022   Sedimentation rate    Standing Status:   Future    Standing Expiration Date:   03/24/2022   Vitamin B12    Standing Status:   Future    Standing Expiration Date:   76/19/5093   Basic Metabolic Panel - [place] Only    Standing Status:   Future    Standing Expiration Date:   03/24/2022    The total time spent in the appointment was 20 minutes encounter with patients including review of chart and various tests results, discussions about plan of care and coordination of care plan   All questions were answered. The patient knows to call the clinic with any problems, questions or concerns. No  barriers to learning was detected.    [name], MD 11/30/202210:08 AM  INTERVAL HISTORY: Belinda Harper 80 y.o. 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 Summary of her history is as follows: This is a pleasant woman with multifactorial anemia and associated leukopenia. 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 The chronic anemia and leukopenia go back as far as we have records (2007) when white counts ran as low as 2700. She has a normal differential. White count average is 3000 and has not changed. Platelet count is normal. Best hemoglobin she achieves with parenteral iron replacement is 11 g. She had a nondiagnostic bone marrow biopsy done in 2006. She had received intravenous iron infusion in the past. Her last colonoscopy in May 2013 showed severe diverticulosis with internal hemorrhoids. She feels well. Denies recent infection. She denies symptoms of fatigue. She tolerated iron supplement well. She had periodic hemorrhoidal bleeding. She was found to have abnormal CBC from intermittently over the years by her primary care doctor She has started taking  oral iron supplements since November of last year Most recently, she have noted passage of dark stool She attempted to increase her oral iron supplement but still complain of excessive fatigue Recently, her repeat CBC has dropped from 11-8.2 and hence she was referred back here for further evaluation On 12/15/20, she had repeat EGD and  colonoscopy which showed: - Severe diverticulosis in the sigmoid colon, in the descending colon, in the transverse colon and in the ascending colon. - Non-bleeding external and internal hemorrhoids. - No specimens collected.  She denies recent chest pain on exertion, shortness of breath on minimal exertion, pre-syncopal episodes, or palpitations.  The patient denies over the counter NSAID ingestion. She is not on antiplatelets agents. Her last colonoscopy was in 2013 She had no prior history or diagnosis of cancer. Her age appropriate screening programs are up-to-date. She denies any pica and eats a variety of diet.  The patient was prescribed oral iron supplements and she takes 1 dose of oral iron supplement at night to avoid constipation From Sept 2021 till present, she has received many doses of intravenous iron infusion On December 15, 2020, she had repeat upper endoscopy evaluation due to severe anemia which showed LA Grade B reflux esophagitis with no bleeding. 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 - Gastritis. Biopsied. - Normal examined duodenum.  I have reviewed the past medical history, past surgical history, social history and family history with the patient and they are unchanged from previous note.  ALLERGIES:  is allergic to sulfamethoxazole, nsaids, and sulfa antibiotics.  MEDICATIONS:  Current Outpatient Medications  Medication Sig Dispense Refill   amLODipine (NORVASC) 5 MG tablet TAKE 1 TABLET (5 MG TOTAL) BY MOUTH DAILY. 90 tablet 3   Calcium Citrate-Vitamin D (CITRACAL + D PO) Take 1 tablet by mouth daily with lunch. 500 mg calcium (2 X 250 mg)     Cholecalciferol (VITAMIN D3 PO) Take 1,000 Units by mouth daily.     magnesium oxide (MAG-OX) 400 MG tablet Take 400 mg by mouth daily.     Menatetrenone (VITAMIN K2) 100 MCG TABS Take 800 mcg by mouth daily.     metoprolol succinate (TOPROL-XL) 25 MG 24 hr tablet  Take 0.5 tablets (12.5 mg total) by mouth at bedtime. 45 tablet 3   Multiple Vitamin (MULTIVITAMIN WITH MINERALS) TABS tablet Take 1 tablet by mouth daily. One a Day - Women's     pantoprazole (PROTONIX) 40 MG tablet Take 40 mg by mouth daily for 3 months. 90 tablet 0   vitamin C (ASCORBIC ACID) 500 MG tablet Take 500 mg by mouth daily.     No current facility-administered medications for this visit.     REVIEW OF SYSTEMS:   Constitutional: Denies fevers, chills or night sweats Eyes: Denies blurriness of vision Ears, nose, mouth, throat, and face: Denies mucositis or sore throat Respiratory: Denies cough, dyspnea or wheezes Cardiovascular: Denies palpitation, chest discomfort or lower extremity swelling Gastrointestinal:  Denies nausea, heartburn or change in bowel habits Skin: Denies abnormal skin rashes Lymphatics: Denies new lymphadenopathy or easy bruising Neurological:Denies numbness, tingling or new weaknesses Behavioral/Psych: Mood is stable, no new changes  All other systems were reviewed with the patient and are negative.  PHYSICAL EXAMINATION: ECOG PERFORMANCE STATUS: 1 - Symptomatic but completely ambulatory  Vitals:   03/23/21 1234  BP: (!) 164/60  Pulse: 63  Resp: 18  Temp: 97.8 F (36.6 C)  SpO2: 97%   Filed  Weights   03/23/21 1234  Weight: 155 lb 3.2 oz (70.4 kg)    GENERAL:alert, no distress and comfortable NEURO: alert & oriented x 3 with fluent speech, no focal motor/sensory deficits  LABORATORY DATA:  I have reviewed the data as listed     Component Value Date/Time   NA 140 12/09/2020 1443   NA 142 05/28/2012 1600   K 4.1 12/09/2020 1443   K 3.7 05/28/2012 1600   CL 103 12/09/2020 1443   CL 103 05/28/2012 1600   CO2 31 12/09/2020 1443   CO2 29 05/28/2012 1600   GLUCOSE 79 12/09/2020 1443   GLUCOSE 99 05/28/2012 1600   BUN 12 12/09/2020 1443   BUN 19.3 05/28/2012 1600   CREATININE 0.89 12/09/2020 1443   CREATININE 0.91 11/05/2020 1211    CREATININE 0.88 12/06/2019 1147   CREATININE 0.8 05/28/2012 1600   CALCIUM 9.6 12/09/2020 1443   CALCIUM 9.5 05/28/2012 1600   PROT 7.8 12/09/2020 1443   PROT 7.3 05/28/2012 1600   ALBUMIN 4.4 12/09/2020 1443   ALBUMIN 3.5 05/28/2012 1600   AST 25 12/09/2020 1443   AST 23 05/28/2012 1600   ALT 15 12/09/2020 1443   ALT 13 05/28/2012 1600   ALKPHOS 79 12/09/2020 1443   ALKPHOS 82 05/28/2012 1600   BILITOT 0.3 12/09/2020 1443   BILITOT <0.20 05/28/2012 1600   GFRNONAA >60 11/05/2020 1211   GFRAA >60 04/23/2018 1634    No results found for: SPEP, UPEP  Lab Results  Component Value Date   WBC 3.5 (L) 03/23/2021   NEUTROABS 1.9 03/23/2021   HGB 10.6 (L) 03/23/2021   HCT 35.2 (L) 03/23/2021   MCV 93.1 03/23/2021   PLT 221 03/23/2021      Chemistry      Component Value Date/Time   NA 140 12/09/2020 1443   NA 142 05/28/2012 1600   K 4.1 12/09/2020 1443   K 3.7 05/28/2012 1600   CL 103 12/09/2020 1443   CL 103 05/28/2012 1600   CO2 31 12/09/2020 1443   CO2 29 05/28/2012 1600   BUN 12 12/09/2020 1443   BUN 19.3 05/28/2012 1600   CREATININE 0.89 12/09/2020 1443   CREATININE 0.91 11/05/2020 1211   CREATININE 0.88 12/06/2019 1147   CREATININE 0.8 05/28/2012 1600      Component Value Date/Time   CALCIUM 9.6 12/09/2020 1443   CALCIUM 9.5 05/28/2012 1600   ALKPHOS 79 12/09/2020 1443   ALKPHOS 82 05/28/2012 1600   AST 25 12/09/2020 1443   AST 23 05/28/2012 1600   ALT 15 12/09/2020 1443   ALT 13 05/28/2012 1600   BILITOT 0.3 12/09/2020 1443   BILITOT <0.20 05/28/2012 1600

## 2021-03-25 ENCOUNTER — Telehealth: Payer: Self-pay | Admitting: Hematology and Oncology

## 2021-03-25 NOTE — Telephone Encounter (Signed)
Scheduled and mailed calendar per sch msg

## 2021-04-02 ENCOUNTER — Encounter: Payer: Self-pay | Admitting: Internal Medicine

## 2021-04-06 ENCOUNTER — Encounter: Payer: Self-pay | Admitting: Obstetrics & Gynecology

## 2021-04-06 ENCOUNTER — Other Ambulatory Visit: Payer: Self-pay

## 2021-04-06 ENCOUNTER — Ambulatory Visit (INDEPENDENT_AMBULATORY_CARE_PROVIDER_SITE_OTHER): Payer: Medicare Other | Admitting: Obstetrics & Gynecology

## 2021-04-06 VITALS — BP 126/62 | HR 61 | Resp 16

## 2021-04-06 DIAGNOSIS — Z4689 Encounter for fitting and adjustment of other specified devices: Secondary | ICD-10-CM

## 2021-04-06 NOTE — Progress Notes (Signed)
° ° °  Belinda Harper Oct 25, 1940 [id number]        80 y.o.  [id number]    RP: Pessary maintenance   HPI: Doing very well on Milex ring #5 with support. Very mild vaginal discharge, no itching or odor, no bleeding, no vaginal pain.  Urine/BMs normal.    OB History  Gravida Para Term Preterm AB Living  3 2     1 2   SAB IAB Ectopic Multiple Live Births  1            # Outcome Date GA Lbr Len/2nd Weight Sex Delivery Anes PTL Lv  3 SAB           2 Para           1 Para             Past medical history,surgical history, problem list, medications, allergies, family history and social history were all reviewed and documented in the EPIC chart.   Directed ROS with pertinent positives and negatives documented in the history of present illness/assessment and plan.  Exam:  Vitals:   04/06/21 1346  BP: 126/62  Pulse: 61  Resp: 16   General appearance:  Normal    Abdomen: Normal   Gynecologic exam: Vulva normal.  Pessary removed easily, no abnormal discharge.  Pessary cleaned.  Vaginal exam revealed intact vaginal mucosa.  Pessary inserted in vagina easily, good fit.     Assessment/Plan:  80 y.o. [id number]   1. Encounter for pessary maintenance Doing very well with Milex ring #5 with support.  No complication.  Will f/u in 4-5 months for pessary maintenance.  Belinda Bruins MD, 2:04 PM 04/06/2021

## 2021-04-13 ENCOUNTER — Ambulatory Visit: Payer: Medicare Other

## 2021-04-13 DIAGNOSIS — M810 Age-related osteoporosis without current pathological fracture: Secondary | ICD-10-CM

## 2021-04-13 MED ORDER — DENOSUMAB 60 MG/ML ~~LOC~~ SOSY
60.0000 mg | PREFILLED_SYRINGE | Freq: Once | SUBCUTANEOUS | Status: AC
  Administered 2021-04-13: 11:00:00 60 mg via SUBCUTANEOUS

## 2021-04-13 MED ORDER — DENOSUMAB 60 MG/ML ~~LOC~~ SOSY
60.0000 mg | PREFILLED_SYRINGE | Freq: Once | SUBCUTANEOUS | Status: DC
Start: 2021-04-13 — End: 2021-04-13

## 2021-04-13 NOTE — Progress Notes (Signed)
Belinda Harper is a 80 y.o. female presents to the office today for Prolia injections, per physician's orders. Original order:  Prolia (med), 32ml  (dose),  subcutanous (route) was administered left (location) today. Patient tolerated injection.   Natayah Warmack D Destin Vinsant

## 2021-05-07 ENCOUNTER — Ambulatory Visit (INDEPENDENT_AMBULATORY_CARE_PROVIDER_SITE_OTHER): Payer: Medicare Other | Admitting: Gastroenterology

## 2021-05-07 ENCOUNTER — Encounter: Payer: Self-pay | Admitting: Gastroenterology

## 2021-05-07 ENCOUNTER — Ambulatory Visit: Payer: Medicare Other | Admitting: Gastroenterology

## 2021-05-07 VITALS — BP 144/70 | HR 76 | Ht 66.0 in | Wt 160.0 lb

## 2021-05-07 DIAGNOSIS — K641 Second degree hemorrhoids: Secondary | ICD-10-CM

## 2021-05-07 NOTE — Patient Instructions (Signed)
HEMORRHOID BANDING PROCEDURE  ? ? FOLLOW-UP CARE ? ? ?The procedure you have had should have been relatively painless since the banding of the area involved does not have nerve endings and there is no pain sensation.  The rubber band cuts off the blood supply to the hemorrhoid and the band may fall off as soon as 48 hours after the banding (the band may occasionally be seen in the toilet bowl following a bowel movement). You may notice a temporary feeling of fullness in the rectum which should respond adequately to plain Tylenol? or Motrin?. ? ?Following the banding, avoid strenuous exercise that evening and resume full activity the next day.  A sitz bath (soaking in a warm tub) or bidet is soothing, and can be useful for cleansing the area after bowel movements.   ? ? ?To avoid constipation, take two tablespoons of natural wheat bran, natural oat bran, flax, Benefiber? or any over the counter fiber supplement and increase your water intake to 7-8 glasses daily.   ? ?Unless you have been prescribed anorectal medication, do not put anything inside your rectum for two weeks: No suppositories, enemas, fingers, etc. ? ?Occasionally, you may have more bleeding than usual after the banding procedure.  This is often from the untreated hemorrhoids rather than the treated one.  Don?t be concerned if there is a tablespoon or so of blood.  If there is more blood than this, lie flat with your bottom higher than your head and apply an ice pack to the area. If the bleeding does not stop within a half an hour or if you feel faint, call our office at [phone] or go to the emergency room. ? ?Problems are not common; however, if there is a substantial amount of bleeding, severe pain, chills, fever or difficulty passing urine (very rare) or other problems, you should call us at [phone] or report to the nearest emergency room. ? ?Do not stay seated continuously for more than 2-3 hours for a day or two after the procedure.   Tighten your buttock muscles 10-15 times every two hours and take 10-15 deep breaths every 1-2 hours.  Do not spend more than a few minutes on the toilet if you cannot empty your bowel; instead re-visit the toilet at a later time. ? ? I appreciate the  opportunity to care for you ? ?Thank You  ? ?Kavitha Nandigam , MD  ?

## 2021-05-07 NOTE — Progress Notes (Signed)
PROCEDURE NOTE: The patient presents with symptomatic grade II  hemorrhoids, requesting rubber band ligation of his/her hemorrhoidal disease.  All risks, benefits and alternative forms of therapy were described and informed consent was obtained.   The anorectum was pre-medicated with 0.125% NTG and Recticare The decision was made to band the Right anterior internal hemorrhoid, and the Selden was used to perform band ligation without complication.  Digital anorectal examination was then performed to assure proper positioning of the band, and to adjust the banded tissue as required.  The patient was discharged home without pain or other issues.  Dietary and behavioral recommendations were given and along with follow-up instructions.     The following adjunctive treatments were recommended:  Benefiber 1 tablespoon BID with meals Kegel exercise  The patient will return in 4 weeks for  follow-up and possible additional banding as required. No complications were encountered and the patient tolerated the procedure well.   Damaris Hippo , MD [phone]

## 2021-05-07 NOTE — Progress Notes (Signed)
Duplicate encounter

## 2021-05-14 ENCOUNTER — Other Ambulatory Visit: Payer: Self-pay

## 2021-05-14 DIAGNOSIS — D539 Nutritional anemia, unspecified: Secondary | ICD-10-CM

## 2021-05-14 DIAGNOSIS — D509 Iron deficiency anemia, unspecified: Secondary | ICD-10-CM

## 2021-05-17 ENCOUNTER — Telehealth: Payer: Self-pay

## 2021-05-17 ENCOUNTER — Other Ambulatory Visit: Payer: Self-pay | Admitting: Hematology and Oncology

## 2021-05-17 ENCOUNTER — Encounter: Payer: Self-pay | Admitting: Hematology and Oncology

## 2021-05-17 ENCOUNTER — Other Ambulatory Visit: Payer: Self-pay

## 2021-05-17 ENCOUNTER — Inpatient Hospital Stay: Payer: Medicare Other | Attending: Hematology and Oncology

## 2021-05-17 ENCOUNTER — Inpatient Hospital Stay (HOSPITAL_BASED_OUTPATIENT_CLINIC_OR_DEPARTMENT_OTHER): Payer: Medicare Other | Admitting: Hematology and Oncology

## 2021-05-17 ENCOUNTER — Inpatient Hospital Stay: Payer: Medicare Other

## 2021-05-17 VITALS — BP 135/44 | HR 73 | Resp 18 | Ht 66.0 in | Wt 157.4 lb

## 2021-05-17 DIAGNOSIS — R701 Abnormal plasma viscosity: Secondary | ICD-10-CM | POA: Diagnosis not present

## 2021-05-17 DIAGNOSIS — D500 Iron deficiency anemia secondary to blood loss (chronic): Secondary | ICD-10-CM | POA: Diagnosis present

## 2021-05-17 DIAGNOSIS — D539 Nutritional anemia, unspecified: Secondary | ICD-10-CM

## 2021-05-17 DIAGNOSIS — Z79899 Other long term (current) drug therapy: Secondary | ICD-10-CM | POA: Diagnosis not present

## 2021-05-17 DIAGNOSIS — K922 Gastrointestinal hemorrhage, unspecified: Secondary | ICD-10-CM | POA: Insufficient documentation

## 2021-05-17 DIAGNOSIS — D509 Iron deficiency anemia, unspecified: Secondary | ICD-10-CM | POA: Insufficient documentation

## 2021-05-17 DIAGNOSIS — R600 Localized edema: Secondary | ICD-10-CM | POA: Diagnosis not present

## 2021-05-17 DIAGNOSIS — D649 Anemia, unspecified: Secondary | ICD-10-CM

## 2021-05-17 LAB — CBC WITH DIFFERENTIAL/PLATELET
Abs Immature Granulocytes: 0.02 10*3/uL (ref 0.00–0.07)
Basophils Absolute: 0 10*3/uL (ref 0.0–0.1)
Basophils Relative: 1 %
Eosinophils Absolute: 0.2 10*3/uL (ref 0.0–0.5)
Eosinophils Relative: 4 %
HCT: 21.9 % — ABNORMAL LOW (ref 36.0–46.0)
Hemoglobin: 6.3 g/dL — CL (ref 12.0–15.0)
Immature Granulocytes: 1 %
Lymphocytes Relative: 29 %
Lymphs Abs: 1.1 10*3/uL (ref 0.7–4.0)
MCH: 26.7 pg (ref 26.0–34.0)
MCHC: 28.8 g/dL — ABNORMAL LOW (ref 30.0–36.0)
MCV: 92.8 fL (ref 80.0–100.0)
Monocytes Absolute: 0.3 10*3/uL (ref 0.1–1.0)
Monocytes Relative: 9 %
Neutro Abs: 2.2 10*3/uL (ref 1.7–7.7)
Neutrophils Relative %: 56 %
Platelets: 242 10*3/uL (ref 150–400)
RBC: 2.36 MIL/uL — ABNORMAL LOW (ref 3.87–5.11)
RDW: 18.6 % — ABNORMAL HIGH (ref 11.5–15.5)
WBC: 3.9 10*3/uL — ABNORMAL LOW (ref 4.0–10.5)
nRBC: 0 % (ref 0.0–0.2)

## 2021-05-17 LAB — BASIC METABOLIC PANEL - CANCER CENTER ONLY
Anion gap: 7 (ref 5–15)
BUN: 24 mg/dL — ABNORMAL HIGH (ref 8–23)
CO2: 29 mmol/L (ref 22–32)
Calcium: 9 mg/dL (ref 8.9–10.3)
Chloride: 103 mmol/L (ref 98–111)
Creatinine: 0.85 mg/dL (ref 0.44–1.00)
GFR, Estimated: >60 mL/min (ref >60)
Glucose, Bld: 88 mg/dL (ref 70–99)
Potassium: 3.9 mmol/L (ref 3.5–5.1)
Sodium: 139 mmol/L (ref 135–145)

## 2021-05-17 LAB — IRON AND IRON BINDING CAPACITY (CC-WL,HP ONLY)
Iron: 34 ug/dL (ref 28–170)
Saturation Ratios: 12 % (ref 10.4–31.8)
TIBC: 293 ug/dL (ref 250–450)
UIBC: 259 ug/dL (ref 148–442)

## 2021-05-17 LAB — RETICULOCYTES
Immature Retic Fract: 35.8 % — ABNORMAL HIGH (ref 2.3–15.9)
RBC.: 2.34 MIL/uL — ABNORMAL LOW (ref 3.87–5.11)
Retic Count, Absolute: 94.8 10*3/uL (ref 19.0–186.0)
Retic Ct Pct: 4.1 % — ABNORMAL HIGH (ref 0.4–3.1)

## 2021-05-17 LAB — FERRITIN: Ferritin: 206 ng/mL (ref 11–307)

## 2021-05-17 LAB — VITAMIN B12: Vitamin B-12: 493 pg/mL (ref 180–914)

## 2021-05-17 LAB — SEDIMENTATION RATE: Sed Rate: 38 mm/hr — ABNORMAL HIGH (ref 0–22)

## 2021-05-17 LAB — PREPARE RBC (CROSSMATCH)

## 2021-05-17 MED ORDER — ACETAMINOPHEN 325 MG PO TABS
650.0000 mg | ORAL_TABLET | Freq: Once | ORAL | Status: AC
  Administered 2021-05-17: 650 mg via ORAL

## 2021-05-17 MED ORDER — SODIUM CHLORIDE 0.9% IV SOLUTION
250.0000 mL | Freq: Once | INTRAVENOUS | Status: AC
  Administered 2021-05-17: 250 mL via INTRAVENOUS

## 2021-05-17 MED ORDER — DIPHENHYDRAMINE HCL 25 MG PO CAPS
25.0000 mg | ORAL_CAPSULE | Freq: Once | ORAL | Status: AC
  Administered 2021-05-17: 25 mg via ORAL

## 2021-05-17 NOTE — Assessment & Plan Note (Addendum)
In the past, the patient had severe iron deficiency anemia secondary to GI bleed In the last 2 times, since her recent treatment with Monoferric, she no longer have iron deficiency but her persistent anemia I reviewed all the test results with the patient Her B12 level is adequate She has normal renal function Iron studies are more than adequate She has relative deficient reticulocytosis and mildly increased sed rate Erythropoietin level is still pending I am now concerned about either inadequate erythropoiesis secondary to deficiency in erythropoietin production or bone marrow failure I will call her when I have the test results She might need repeat bone marrow aspirate and biopsy  We discussed some of the risks, benefits, and alternatives of blood transfusions. The patient is symptomatic from anemia and the hemoglobin level is critically low.  Some of the side-effects to be expected including risks of transfusion reactions, chills, infection, syndrome of volume overload and risk of hospitalization from various reasons and the patient is willing to proceed and went ahead to sign consent today. She will receive 1 unit of blood today and 1 unit of blood tomorrow

## 2021-05-17 NOTE — Telephone Encounter (Signed)
Called and left a message asking her to call the office back. 

## 2021-05-17 NOTE — Telephone Encounter (Signed)
Called back. Reviewed upcoming appts. 1 unit of blood today and 1 tomorrow. Reminded to keep blood bracelet on for tomorrow. She verbalized understanding.

## 2021-05-17 NOTE — Patient Instructions (Signed)
Blood Transfusion, Adult, Care After This sheet gives you information about how to care for yourself after your procedure. Your doctor may also give you more specific instructions. If you have problems or questions, contact your doctor. What can I expect after the procedure? After the procedure, it is common to have: Bruising and soreness at the IV site. A headache. Follow these instructions at home: Insertion site care   Follow instructions from your doctor about how to take care of your insertion site. This is where an IV tube was put into your vein. Make sure you: Wash your hands with soap and water before and after you change your bandage (dressing). If you cannot use soap and water, use hand sanitizer. Change your bandage as told by your doctor. Check your insertion site every day for signs of infection. Check for: Redness, swelling, or pain. Bleeding from the site. Warmth. Pus or a bad smell. General instructions Take over-the-counter and prescription medicines only as told by your doctor. Rest as told by your doctor. Go back to your normal activities as told by your doctor. Keep all follow-up visits as told by your doctor. This is important. Contact a doctor if: You have itching or red, swollen areas of skin (hives). You feel worried or nervous (anxious). You feel weak after doing your normal activities. You have redness, swelling, warmth, or pain around the insertion site. You have blood coming from the insertion site, and the blood does not stop with pressure. You have pus or a bad smell coming from the insertion site. Get help right away if: You have signs of a serious reaction. This may be coming from an allergy or the body's defense system (immune system). Signs include: Trouble breathing or shortness of breath. Swelling of the face or feeling warm (flushed). Fever or chills. Head, chest, or back pain. Dark pee (urine) or blood in the pee. Widespread rash. Fast  heartbeat. Feeling dizzy or light-headed. You may receive your blood transfusion in an outpatient setting. If so, you will be told whom to contact to report any reactions. These symptoms may be an emergency. Do not wait to see if the symptoms will go away. Get medical help right away. Call your local emergency services (911 in the [place] Do not drive yourself to the hospital. Summary Bruising and soreness at the IV site are common. Check your insertion site every day for signs of infection. Rest as told by your doctor. Go back to your normal activities as told by your doctor. Get help right away if you have signs of a serious reaction. This information is not intended to replace advice given to you by your health care provider. Make sure you discuss any questions you have with your health care provider. Document Revised: 08/06/2020 Document Reviewed: 10/04/2018 Elsevier Patient Education  2022 [employer]  

## 2021-05-17 NOTE — Assessment & Plan Note (Signed)
This is likely secondary to stress to her cardiovascular system from symptomatic anemia If her leg swelling does not improve despite blood transfusion, we may have to order repeat echocardiogram to rule out congestive heart failure

## 2021-05-17 NOTE — Telephone Encounter (Signed)
She called back. Given hgb results 6.3, Added lab appt at 2:15 today, Dr. Alvy Bimler wants her to get 2 units of blood. She verbalized understanding.

## 2021-05-17 NOTE — Progress Notes (Signed)
Per Dr. Alvy Bimler, okay to transfuse blood at 312ml/hr after the first 15 minutes.

## 2021-05-17 NOTE — Progress Notes (Signed)
[facility] OFFICE PROGRESS NOTE  Panosh, Standley Brooking, MD  ASSESSMENT & PLAN:  Symptomatic anemia In the past, the patient had severe iron deficiency anemia secondary to GI bleed In the last 2 times, since her recent treatment with Monoferric, she no longer have iron deficiency but her persistent anemia I reviewed all the test results with the patient Her B12 level is adequate She has normal renal function Iron studies are more than adequate She has relative deficient reticulocytosis and mildly increased sed rate Erythropoietin level is still pending I am now concerned about either inadequate erythropoiesis secondary to deficiency in erythropoietin production or bone marrow failure I will call her when I have the test results She might need repeat bone marrow aspirate and biopsy  We discussed some of the risks, benefits, and alternatives of blood transfusions. The patient is symptomatic from anemia and the hemoglobin level is critically low.  Some of the side-effects to be expected including risks of transfusion reactions, chills, infection, syndrome of volume overload and risk of hospitalization from various reasons and the patient is willing to proceed and went ahead to sign consent today. She will receive 1 unit of blood today and 1 unit of blood tomorrow  Bilateral leg edema This is likely secondary to stress to her cardiovascular system from symptomatic anemia If her leg swelling does not improve despite blood transfusion, we may have to order repeat echocardiogram to rule out congestive heart failure  Orders Placed This Encounter  Procedures   Sample to Blood Bank    Standing Status:   Standing    Number of Occurrences:   33    Standing Expiration Date:   05/17/2022    The total time spent in the appointment was 40 minutes encounter with patients including review of chart and various tests results, discussions about plan of care and coordination of care plan    All questions were answered. The patient knows to call the clinic with any problems, questions or concerns. No barriers to learning was detected.    [name], MD 1/23/20233:33 PM  INTERVAL HISTORY: Belinda Harper 81 y.o. 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 Summary of her history is as follows: This is a pleasant woman with multifactorial anemia and associated leukopenia. 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 The chronic anemia and leukopenia go back as far as we have records (2007) when white counts ran as low as 2700. She has a normal differential. White count average is 3000 and has not changed. Platelet count is normal. Best hemoglobin she achieves with parenteral iron replacement is 11 g. She had a nondiagnostic bone marrow biopsy done  in 2006. She had received intravenous iron infusion in the past. Her last colonoscopy in May 2013 showed severe diverticulosis with internal hemorrhoids. She feels well. Denies recent infection. She denies symptoms of fatigue. She tolerated iron supplement well. She had periodic hemorrhoidal bleeding. She was found to have abnormal  CBC from intermittently over the years by her primary care doctor She has started taking oral iron supplements since November of last year Most recently, she have noted passage of dark stool She attempted to increase her oral iron supplement but still complain of excessive fatigue Recently, her repeat CBC has dropped from 11-8.2 and hence she was referred back here for further evaluation On 12/15/20, she had repeat EGD and colonoscopy which showed: - Severe diverticulosis in the sigmoid colon, in the descending colon, in the transverse colon and in the ascending colon. - Non-bleeding external and internal hemorrhoids. - No specimens collected.  She denies recent chest pain on exertion, shortness of breath on minimal exertion, pre-syncopal episodes, or palpitations.  The patient denies over the counter NSAID ingestion. She is not on antiplatelets agents. Her last colonoscopy was in 2013 She had no prior history or diagnosis of cancer. Her age appropriate screening programs are up-to-date. She denies any pica and eats a variety of diet.  The patient was prescribed oral iron supplements and she takes 1 dose of oral iron supplement at night to avoid constipation From Sept 2021 till present, she has received many doses of intravenous iron infusion On December 15, 2020, she had repeat upper endoscopy evaluation due to severe anemia which showed LA Grade B reflux esophagitis with no bleeding. 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 - Gastritis. Biopsied. - Normal examined duodenum.  Since Monoferric IV iron in October 2022, her iron deficiency resolved  I have reviewed the past medical history, past surgical history, social history and family history with the patient and they are unchanged from previous note.  ALLERGIES:  is allergic to sulfamethoxazole, nsaids, and sulfa antibiotics.  MEDICATIONS:  Current Outpatient Medications   Medication Sig Dispense Refill   amLODipine (NORVASC) 5 MG tablet TAKE 1 TABLET (5 MG TOTAL) BY MOUTH DAILY. 90 tablet 3   Calcium Citrate-Vitamin D (CITRACAL + D PO) Take 1 tablet by mouth daily with lunch. 500 mg calcium (2 X 250 mg)     Cholecalciferol (VITAMIN D3 PO) Take 1,000 Units by mouth daily.     magnesium oxide (MAG-OX) 400 MG tablet Take 400 mg by mouth daily.     Menatetrenone (VITAMIN K2) 100 MCG TABS Take 800 mcg by mouth daily.     metoprolol succinate (TOPROL-XL) 25 MG 24 hr tablet Take 0.5 tablets (12.5 mg total) by mouth at bedtime. 45 tablet 3   Multiple Vitamin (MULTIVITAMIN WITH MINERALS) TABS tablet Take 1 tablet by mouth daily. One a Day - Women's     vitamin C (ASCORBIC ACID) 500 MG tablet Take 500 mg by mouth daily.     No current facility-administered medications for this visit.   Facility-Administered Medications Ordered in Other Visits  Medication Dose Route Frequency Provider Last Rate Last Admin   0.9 %  sodium chloride infusion (Manually program via Guardrails IV Fluids)  250 mL Intravenous Once Alvy Bimler, Tomicka Lover, MD         REVIEW OF SYSTEMS:   Constitutional: Denies fevers, chills or night sweats Eyes: Denies blurriness of vision Ears, nose, mouth, throat, and face: Denies mucositis or sore throat  Gastrointestinal:  Denies nausea, heartburn or change in bowel habits Skin: Denies abnormal skin rashes Lymphatics: Denies new lymphadenopathy or easy bruising Neurological:Denies numbness, tingling or new weaknesses Behavioral/Psych: Mood is stable, no new changes  All other systems were reviewed with the patient and are negative.  PHYSICAL EXAMINATION: ECOG PERFORMANCE STATUS: 1 - Symptomatic but completely ambulatory  Vitals:   05/17/21 1453  BP: (!) 135/44  Pulse: 73  Resp: 18  SpO2: 99%   Filed Weights   05/17/21 1453  Weight: 157 lb 6.4 oz (71.4 kg)    GENERAL:alert, no distress and comfortable.  She looks very pale- HEART: She has mild  bilateral lower extremity edema Musculoskeletal:no cyanosis of digits and no clubbing  NEURO: alert & oriented x 3 with fluent speech, no focal motor/sensory deficits  LABORATORY DATA:  I have reviewed the data as listed     Component Value Date/Time   NA 139 05/17/2021 1205   NA 142 05/28/2012 1600   K 3.9 05/17/2021 1205   K 3.7 05/28/2012 1600   CL 103 05/17/2021 1205   CL 103 05/28/2012 1600   CO2 29 05/17/2021 1205   CO2 29 05/28/2012 1600   GLUCOSE 88 05/17/2021 1205   GLUCOSE 99 05/28/2012 1600   BUN 24 (H) 05/17/2021 1205   BUN 19.3 05/28/2012 1600   CREATININE 0.85 05/17/2021 1205   CREATININE 0.88 12/06/2019 1147   CREATININE 0.8 05/28/2012 1600   CALCIUM 9.0 05/17/2021 1205   CALCIUM 9.5 05/28/2012 1600   PROT 7.8 12/09/2020 1443   PROT 7.3 05/28/2012 1600   ALBUMIN 4.4 12/09/2020 1443   ALBUMIN 3.5 05/28/2012 1600   AST 25 12/09/2020 1443   AST 23 05/28/2012 1600   ALT 15 12/09/2020 1443   ALT 13 05/28/2012 1600   ALKPHOS 79 12/09/2020 1443   ALKPHOS 82 05/28/2012 1600   BILITOT 0.3 12/09/2020 1443   BILITOT <0.20 05/28/2012 1600   GFRNONAA >60 05/17/2021 1205   GFRAA >60 04/23/2018 1634    No results found for: SPEP, UPEP  Lab Results  Component Value Date   WBC 3.9 (L) 05/17/2021   NEUTROABS 2.2 05/17/2021   HGB 6.3 (LL) 05/17/2021   HCT 21.9 (L) 05/17/2021   MCV 92.8 05/17/2021   PLT 242 05/17/2021      Chemistry      Component Value Date/Time   NA 139 05/17/2021 1205   NA 142 05/28/2012 1600   K 3.9 05/17/2021 1205   K 3.7 05/28/2012 1600   CL 103 05/17/2021 1205   CL 103 05/28/2012 1600   CO2 29 05/17/2021 1205   CO2 29 05/28/2012 1600   BUN 24 (H) 05/17/2021 1205   BUN 19.3 05/28/2012 1600   CREATININE 0.85 05/17/2021 1205   CREATININE 0.88 12/06/2019 1147   CREATININE 0.8 05/28/2012 1600      Component Value Date/Time   CALCIUM 9.0 05/17/2021 1205   CALCIUM 9.5 05/28/2012 1600   ALKPHOS 79 12/09/2020 1443   ALKPHOS 82  05/28/2012 1600   AST 25 12/09/2020 1443   AST 23 05/28/2012 1600   ALT 15 12/09/2020 1443   ALT 13 05/28/2012 1600   BILITOT 0.3 12/09/2020 1443   BILITOT <0.20 05/28/2012 1600

## 2021-05-17 NOTE — Telephone Encounter (Signed)
CRITICAL VALUE STICKER  CRITICAL VALUE: Hgb = 6.3  RECEIVER (on-site recipient of call): Yetta Glassman, [place] NOTIFIED: 05/17/21 at 12:20pm  MESSENGER (representative from lab): Pam  MD NOTIFIED: [facility]  TIME OF NOTIFICATION: 05/17/21 at 12:28pm  RESPONSE: Notification given to Hassan Rowan, RN and Dr. Alvy Bimler directly for follow-up with pt.

## 2021-05-18 ENCOUNTER — Telehealth: Payer: Self-pay

## 2021-05-18 ENCOUNTER — Other Ambulatory Visit: Payer: Self-pay | Admitting: Hematology and Oncology

## 2021-05-18 ENCOUNTER — Telehealth: Payer: Self-pay | Admitting: Hematology and Oncology

## 2021-05-18 ENCOUNTER — Ambulatory Visit: Payer: Medicare Other

## 2021-05-18 ENCOUNTER — Other Ambulatory Visit: Payer: Self-pay

## 2021-05-18 ENCOUNTER — Inpatient Hospital Stay: Payer: Medicare Other | Admitting: Hematology and Oncology

## 2021-05-18 ENCOUNTER — Other Ambulatory Visit: Payer: Medicare Other

## 2021-05-18 ENCOUNTER — Inpatient Hospital Stay: Payer: Medicare Other

## 2021-05-18 DIAGNOSIS — D509 Iron deficiency anemia, unspecified: Secondary | ICD-10-CM

## 2021-05-18 DIAGNOSIS — D500 Iron deficiency anemia secondary to blood loss (chronic): Secondary | ICD-10-CM | POA: Diagnosis not present

## 2021-05-18 LAB — ERYTHROPOIETIN: Erythropoietin: 178.7 m[IU]/mL — ABNORMAL HIGH (ref 2.6–18.5)

## 2021-05-18 MED ORDER — DIPHENHYDRAMINE HCL 25 MG PO CAPS
25.0000 mg | ORAL_CAPSULE | Freq: Once | ORAL | Status: AC
  Administered 2021-05-18: 14:00:00 25 mg via ORAL
  Filled 2021-05-18: qty 1

## 2021-05-18 MED ORDER — SODIUM CHLORIDE 0.9% FLUSH: 3.0000 mL | INTRAVENOUS | Status: DC | PRN

## 2021-05-18 MED ORDER — SODIUM CHLORIDE 0.9% IV SOLUTION
250.0000 mL | Freq: Once | INTRAVENOUS | Status: AC
  Administered 2021-05-18: 14:00:00 250 mL via INTRAVENOUS

## 2021-05-18 MED ORDER — ACETAMINOPHEN 325 MG PO TABS
650.0000 mg | ORAL_TABLET | Freq: Once | ORAL | Status: AC
  Administered 2021-05-18: 14:00:00 650 mg via ORAL
  Filled 2021-05-18: qty 2

## 2021-05-18 NOTE — Telephone Encounter (Signed)
Called regarding mychart message. Reminded of blood transfusion appt at 2 pm today. She verbalized understanding. She keeps getting reminders of canceled appt today with Dr. Alvy Bimler at 1240 and was afraid she would be late.

## 2021-05-18 NOTE — Telephone Encounter (Signed)
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

## 2021-05-19 LAB — TYPE AND SCREEN
ABO/RH(D): O POS
Antibody Screen: NEGATIVE
Unit division: 0
Unit division: 0

## 2021-05-19 LAB — BPAM RBC
Blood Product Expiration Date: 202302212359
Blood Product Expiration Date: 202302212359
ISSUE DATE / TIME: 202301231601
ISSUE DATE / TIME: 202301241407
Unit Type and Rh: 5100
Unit Type and Rh: 5100

## 2021-05-21 ENCOUNTER — Telehealth: Payer: Self-pay

## 2021-05-21 NOTE — Telephone Encounter (Signed)
Called and scheduled lab, Dr. Alvy Bimler appt and injection on 2/3. She is aware of appt date/time.

## 2021-05-28 ENCOUNTER — Encounter: Payer: Self-pay | Admitting: Hematology and Oncology

## 2021-05-28 ENCOUNTER — Inpatient Hospital Stay: Payer: Medicare Other

## 2021-05-28 ENCOUNTER — Inpatient Hospital Stay: Payer: Medicare Other | Attending: Hematology and Oncology | Admitting: Hematology and Oncology

## 2021-05-28 ENCOUNTER — Other Ambulatory Visit: Payer: Self-pay

## 2021-05-28 VITALS — BP 148/52 | HR 69 | Temp 97.4°F | Resp 18 | Ht 66.0 in | Wt 159.0 lb

## 2021-05-28 DIAGNOSIS — D539 Nutritional anemia, unspecified: Secondary | ICD-10-CM

## 2021-05-28 DIAGNOSIS — D649 Anemia, unspecified: Secondary | ICD-10-CM

## 2021-05-28 DIAGNOSIS — Z79899 Other long term (current) drug therapy: Secondary | ICD-10-CM | POA: Diagnosis not present

## 2021-05-28 DIAGNOSIS — D500 Iron deficiency anemia secondary to blood loss (chronic): Secondary | ICD-10-CM | POA: Diagnosis present

## 2021-05-28 DIAGNOSIS — K922 Gastrointestinal hemorrhage, unspecified: Secondary | ICD-10-CM | POA: Insufficient documentation

## 2021-05-28 DIAGNOSIS — D509 Iron deficiency anemia, unspecified: Secondary | ICD-10-CM

## 2021-05-28 LAB — CBC WITH DIFFERENTIAL/PLATELET
Abs Immature Granulocytes: 0.01 10*3/uL (ref 0.00–0.07)
Basophils Absolute: 0 10*3/uL (ref 0.0–0.1)
Basophils Relative: 1 %
Eosinophils Absolute: 0.2 10*3/uL (ref 0.0–0.5)
Eosinophils Relative: 7 %
HCT: 30.2 % — ABNORMAL LOW (ref 36.0–46.0)
Hemoglobin: 8.9 g/dL — ABNORMAL LOW (ref 12.0–15.0)
Immature Granulocytes: 0 %
Lymphocytes Relative: 35 %
Lymphs Abs: 1.1 10*3/uL (ref 0.7–4.0)
MCH: 26.9 pg (ref 26.0–34.0)
MCHC: 29.5 g/dL — ABNORMAL LOW (ref 30.0–36.0)
MCV: 91.2 fL (ref 80.0–100.0)
Monocytes Absolute: 0.3 10*3/uL (ref 0.1–1.0)
Monocytes Relative: 11 %
Neutro Abs: 1.4 10*3/uL — ABNORMAL LOW (ref 1.7–7.7)
Neutrophils Relative %: 46 %
Platelets: 244 10*3/uL (ref 150–400)
RBC: 3.31 MIL/uL — ABNORMAL LOW (ref 3.87–5.11)
RDW: 17 % — ABNORMAL HIGH (ref 11.5–15.5)
WBC: 3 10*3/uL — ABNORMAL LOW (ref 4.0–10.5)
nRBC: 0 % (ref 0.0–0.2)

## 2021-05-28 LAB — SAMPLE TO BLOOD BANK

## 2021-05-28 MED ORDER — DARBEPOETIN ALFA 300 MCG/0.6ML IJ SOSY
300.0000 ug | PREFILLED_SYRINGE | Freq: Once | INTRAMUSCULAR | Status: AC
  Administered 2021-05-28: 300 ug via SUBCUTANEOUS
  Filled 2021-05-28: qty 0.6

## 2021-05-28 NOTE — Progress Notes (Signed)
Belinda Harper  Panosh, Standley Brooking, MD  ASSESSMENT & PLAN:  Symptomatic anemia In the past, the patient had severe iron deficiency anemia secondary to GI bleed In the last 2 times, since her recent treatment with Monoferric, she no longer have iron deficiency but her persistent anemia I reviewed all the test results with the patient Her B12 level is adequate She has normal renal function Iron studies are more than adequate She has relative deficient reticulocytosis and mildly increased sed rate Erythropoietin level is suboptimal only elevated, inappropriately low for the degree of anemia We have discontinued zinc supplement This is likely anemia of chronic disease. The patient denies recent history of bleeding such as epistaxis, hematuria or hematochezia. She is symptomatic from the anemia.  We discussed the risks, benefits, side effects of erythropoietin stimulating agents for anemia, with the goal of keeping the hemoglobin greater than 10 g. I discussed with the patient and potential side effects such as risk of thrombosis, severe hypertension, risk of congestive heart failure and stroke and she agreed to proceed. I will start initiial dosing at 200 mcg every 2 weeks and we will reassess her response rates after 3 treatments to assess whether dosage adjustment is needed.  I will see her again in 2 weeks for further follow-up We will continue to monitor her iron studies closely due to ongoing rectal bleeding  Orders Placed This Encounter  Procedures   Iron and Iron Binding Capacity ([facility] only)    Standing Status:   Standing    Number of Occurrences:   9    Standing Expiration Date:   05/28/2022   Ferritin    Standing Status:   Standing    Number of Occurrences:   9    Standing Expiration Date:   05/28/2022    The total time spent in the appointment was 20 minutes encounter with patients including review of chart and various tests results,  discussions about plan of care and coordination of care plan   All questions were answered. The patient knows to call the clinic with any problems, questions or concerns. No barriers to learning was detected.    [name], MD 2/3/202310:12 AM  INTERVAL HISTORY: ADRIE Harper 81 y.o. 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 Summary of her history is as follows: This is a pleasant woman with multifactorial anemia and associated leukopenia. 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 The chronic anemia and leukopenia go back as far as we have records (2007) when white counts ran as low as 2700. She has a normal differential. White count average is 3000 and has not changed. Platelet count is normal. Best hemoglobin she achieves with parenteral iron replacement is 11 g. She had a nondiagnostic bone marrow biopsy done in 2006. She had received intravenous iron infusion in the past. Her last colonoscopy in May 2013 showed severe diverticulosis with internal hemorrhoids. She feels well. Denies recent infection. She denies symptoms of fatigue. She tolerated iron supplement well. She  had periodic hemorrhoidal bleeding. She was found to have abnormal CBC from intermittently over the years by her primary care doctor She has started taking oral iron supplements since November of last year Most recently, she have noted passage of dark stool She attempted to increase her oral iron supplement but still  complain of excessive fatigue Recently, her repeat CBC has dropped from 11-8.2 and hence she was referred back here for further evaluation On 12/15/20, she had repeat EGD and colonoscopy which showed: - Severe diverticulosis in the sigmoid colon, in the descending colon, in the transverse colon and in the ascending colon. - Non-bleeding external and internal hemorrhoids. - No specimens collected.  She denies recent chest pain on exertion, shortness of breath on minimal exertion, pre-syncopal episodes, or palpitations.  The patient denies over the counter NSAID ingestion. She is not on antiplatelets agents. Her last colonoscopy was in 2013 She had no prior history or diagnosis of cancer. Her age appropriate screening programs are up-to-date. She denies any pica and eats a variety of diet.  The patient was prescribed oral iron supplements and she takes 1 dose of oral iron supplement at night to avoid constipation From Sept 2021 till present, she has received many doses of intravenous iron infusion On December 15, 2020, she had repeat upper endoscopy evaluation due to severe anemia which showed LA Grade B reflux esophagitis with no bleeding. 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 - Gastritis. Biopsied. - Normal examined duodenum.  Since Monoferric IV iron in October 2022, her iron deficiency resolved On May 28, 2021, she started on Aranesp injection  I have reviewed the past medical history, past surgical history, social history and family history with the patient and they are unchanged from previous Harper.  ALLERGIES:  is allergic to sulfamethoxazole, nsaids, and sulfa antibiotics.  MEDICATIONS:  Current Outpatient Medications  Medication Sig Dispense Refill   amLODipine (NORVASC) 5 MG tablet TAKE 1 TABLET (5 MG TOTAL) BY MOUTH DAILY. 90 tablet 3   Calcium Citrate-Vitamin D (CITRACAL + D PO) Take 1 tablet by mouth daily with  lunch. 500 mg calcium (2 X 250 mg)     Cholecalciferol (VITAMIN D3 PO) Take 1,000 Units by mouth daily.     magnesium oxide (MAG-OX) 400 MG tablet Take 400 mg by mouth daily.     Menatetrenone (VITAMIN K2) 100 MCG TABS Take 800 mcg by mouth daily.     metoprolol succinate (TOPROL-XL) 25 MG 24 hr tablet Take 0.5 tablets (12.5 mg total) by mouth at bedtime. 45 tablet 3   vitamin C (ASCORBIC ACID) 500 MG tablet Take 500 mg by mouth daily.     No current facility-administered medications for this visit.     REVIEW OF SYSTEMS:   Constitutional: Denies fevers, chills or night sweats Eyes: Denies blurriness of vision Ears, nose, mouth, throat, and face: Denies mucositis or sore throat Respiratory: Denies cough, dyspnea or wheezes Cardiovascular: Denies palpitation, chest discomfort or lower extremity swelling Gastrointestinal:  Denies nausea, heartburn or change in bowel habits Skin: Denies abnormal skin rashes Lymphatics: Denies new lymphadenopathy or easy bruising Neurological:Denies numbness, tingling or new weaknesses Behavioral/Psych: Mood is stable, no new changes  All other systems were reviewed with the patient and are negative.  PHYSICAL EXAMINATION: ECOG PERFORMANCE STATUS: 1 - Symptomatic but completely ambulatory  Vitals:   05/28/21 0827  BP: (!) 148/52  Pulse: 69  Resp: 18  Temp: (!) 97.4 F (36.3 C)  SpO2: 100%   Filed Weights   05/28/21 0827  Weight: 159 lb (72.1 kg)    GENERAL:alert, no distress and comfortable NEURO: alert & oriented x 3 with fluent speech, no focal motor/sensory deficits  LABORATORY DATA:  I have reviewed the data as listed     Component Value Date/Time   NA 139 05/17/2021 1205   NA 142 05/28/2012 1600   K 3.9 05/17/2021 1205   K 3.7 05/28/2012 1600   CL 103 05/17/2021 1205   CL 103 05/28/2012 1600   CO2 29 05/17/2021 1205   CO2 29 05/28/2012 1600   GLUCOSE 88 05/17/2021 1205   GLUCOSE 99 05/28/2012 1600   BUN 24 (H) 05/17/2021  1205   BUN 19.3 05/28/2012 1600   CREATININE 0.85 05/17/2021 1205   CREATININE 0.88 12/06/2019 1147   CREATININE 0.8 05/28/2012 1600   CALCIUM 9.0 05/17/2021 1205   CALCIUM 9.5 05/28/2012 1600   PROT 7.8 12/09/2020 1443   PROT 7.3 05/28/2012 1600   ALBUMIN 4.4 12/09/2020 1443   ALBUMIN 3.5 05/28/2012 1600   AST 25 12/09/2020 1443   AST 23 05/28/2012 1600   ALT 15 12/09/2020 1443   ALT 13 05/28/2012 1600   ALKPHOS 79 12/09/2020 1443   ALKPHOS 82 05/28/2012 1600   BILITOT 0.3 12/09/2020 1443   BILITOT <0.20 05/28/2012 1600   GFRNONAA >60 05/17/2021 1205   GFRAA >60 04/23/2018 1634    No results found for: SPEP, UPEP  Lab Results  Component Value Date   WBC 3.0 (L) 05/28/2021   NEUTROABS 1.4 (L) 05/28/2021   HGB 8.9 (L) 05/28/2021   HCT 30.2 (L) 05/28/2021   MCV 91.2 05/28/2021   PLT 244 05/28/2021      Chemistry      Component Value Date/Time   NA 139 05/17/2021 1205   NA 142 05/28/2012 1600   K 3.9 05/17/2021 1205   K 3.7 05/28/2012 1600   CL 103 05/17/2021 1205   CL 103 05/28/2012 1600   CO2 29 05/17/2021 1205   CO2 29 05/28/2012 1600   BUN 24 (H) 05/17/2021 1205   BUN 19.3 05/28/2012 1600   CREATININE 0.85 05/17/2021 1205   CREATININE 0.88 12/06/2019 1147   CREATININE 0.8 05/28/2012 1600      Component Value Date/Time   CALCIUM 9.0 05/17/2021 1205   CALCIUM 9.5 05/28/2012 1600   ALKPHOS 79 12/09/2020 1443   ALKPHOS 82 05/28/2012 1600   AST 25 12/09/2020 1443   AST 23 05/28/2012 1600   ALT 15 12/09/2020 1443   ALT 13 05/28/2012 1600   BILITOT 0.3 12/09/2020 1443   BILITOT <0.20 05/28/2012 1600

## 2021-05-28 NOTE — Patient Instructions (Signed)
Darbepoetin Alfa injection ?What is this medication? ?DARBEPOETIN ALFA (dar be POE e tin  AL fa) helps your body make more red blood cells. It is used to treat anemia caused by chronic kidney failure and chemotherapy. ?This medicine may be used for other purposes; ask your health care provider or pharmacist if you have questions. ?COMMON BRAND NAME(S): Aranesp ?What should I tell my care team before I take this medication? ?They need to know if you have any of these conditions: ?blood clotting disorders or history of blood clots ?cancer patient not on chemotherapy ?cystic fibrosis ?heart disease, such as angina, heart failure, or a history of a heart attack ?hemoglobin level of 12 g/dL or greater ?high blood pressure ?low levels of folate, iron, or vitamin B12 ?seizures ?an unusual or allergic reaction to darbepoetin, erythropoietin, albumin, hamster proteins, latex, other medicines, foods, dyes, or preservatives ?pregnant or trying to get pregnant ?breast-feeding ?How should I use this medication? ?This medicine is for injection into a vein or under the skin. It is usually given by a health care professional in a hospital or clinic setting. ?If you get this medicine at home, you will be taught how to prepare and give this medicine. Use exactly as directed. Take your medicine at regular intervals. Do not take your medicine more often than directed. ?It is important that you put your used needles and syringes in a special sharps container. Do not put them in a trash can. If you do not have a sharps container, call your pharmacist or healthcare provider to get one. ?A special MedGuide will be given to you by the pharmacist with each prescription and refill. Be sure to read this information carefully each time. ?Talk to your pediatrician regarding the use of this medicine in children. While this medicine may be used in children as young as 1 month of age for selected conditions, precautions do apply. ?Overdosage: If  you think you have taken too much of this medicine contact a poison control center or emergency room at once. ?NOTE: This medicine is only for you. Do not share this medicine with others. ?What if I miss a dose? ?If you miss a dose, take it as soon as you can. If it is almost time for your next dose, take only that dose. Do not take double or extra doses. ?What may interact with this medication? ?Do not take this medicine with any of the following medications: ?epoetin alfa ?This list may not describe all possible interactions. Give your health care provider a list of all the medicines, herbs, non-prescription drugs, or dietary supplements you use. Also tell them if you smoke, drink alcohol, or use illegal drugs. Some items may interact with your medicine. ?What should I watch for while using this medication? ?Your condition will be monitored carefully while you are receiving this medicine. ?You may need blood work done while you are taking this medicine. ?This medicine may cause a decrease in vitamin B6. You should make sure that you get enough vitamin B6 while you are taking this medicine. Discuss the foods you eat and the vitamins you take with your health care professional. ?What side effects may I notice from receiving this medication? ?Side effects that you should report to your doctor or health care professional as soon as possible: ?allergic reactions like skin rash, itching or hives, swelling of the face, lips, or tongue ?breathing problems ?changes in vision ?chest pain ?confusion, trouble speaking or understanding ?feeling faint or lightheaded, falls ?high blood   pressure ?muscle aches or pains ?pain, swelling, warmth in the leg ?rapid weight gain ?severe headaches ?sudden numbness or weakness of the face, arm or leg ?trouble walking, dizziness, loss of balance or coordination ?seizures (convulsions) ?swelling of the ankles, feet, hands ?unusually weak or tired ?Side effects that usually do not require  medical attention (report to your doctor or health care professional if they continue or are bothersome): ?diarrhea ?fever, chills (flu-like symptoms) ?headaches ?nausea, vomiting ?redness, stinging, or swelling at site where injected ?This list may not describe all possible side effects. Call your doctor for medical advice about side effects. You may report side effects to FDA at 1-800-FDA-1088. ?Where should I keep my medication? ?Keep out of the reach of children. ?Store in a refrigerator between 2 and 8 degrees C (36 and 46 degrees F). Do not freeze. Do not shake. Throw away any unused portion if using a single-dose vial. Throw away any unused medicine after the expiration date. ?NOTE: This sheet is a summary. It may not cover all possible information. If you have questions about this medicine, talk to your doctor, pharmacist, or health care provider. ?? 2022 [employer] Standard (2017-05-01 00:00:00) ? ?

## 2021-05-28 NOTE — Assessment & Plan Note (Signed)
In the past, the patient had severe iron deficiency anemia secondary to GI bleed In the last 2 times, since her recent treatment with Monoferric, she no longer have iron deficiency but her persistent anemia I reviewed all the test results with the patient Her B12 level is adequate She has normal renal function Iron studies are more than adequate She has relative deficient reticulocytosis and mildly increased sed rate Erythropoietin level is suboptimal only elevated, inappropriately low for the degree of anemia We have discontinued zinc supplement This is likely anemia of chronic disease. The patient denies recent history of bleeding such as epistaxis, hematuria or hematochezia. She is symptomatic from the anemia.  We discussed the risks, benefits, side effects of erythropoietin stimulating agents for anemia, with the goal of keeping the hemoglobin greater than 10 g. I discussed with the patient and potential side effects such as risk of thrombosis, severe hypertension, risk of congestive heart failure and stroke and she agreed to proceed. I will start initiial dosing at 200 mcg every 2 weeks and we will reassess her response rates after 3 treatments to assess whether dosage adjustment is needed.  I will see her again in 2 weeks for further follow-up We will continue to monitor her iron studies closely due to ongoing rectal bleeding

## 2021-06-10 ENCOUNTER — Inpatient Hospital Stay (HOSPITAL_BASED_OUTPATIENT_CLINIC_OR_DEPARTMENT_OTHER): Payer: Medicare Other | Admitting: Hematology and Oncology

## 2021-06-10 ENCOUNTER — Encounter: Payer: Self-pay | Admitting: Hematology and Oncology

## 2021-06-10 ENCOUNTER — Inpatient Hospital Stay: Payer: Medicare Other

## 2021-06-10 ENCOUNTER — Other Ambulatory Visit: Payer: Self-pay

## 2021-06-10 ENCOUNTER — Telehealth: Payer: Self-pay

## 2021-06-10 DIAGNOSIS — D539 Nutritional anemia, unspecified: Secondary | ICD-10-CM

## 2021-06-10 DIAGNOSIS — D649 Anemia, unspecified: Secondary | ICD-10-CM

## 2021-06-10 DIAGNOSIS — K648 Other hemorrhoids: Secondary | ICD-10-CM | POA: Diagnosis not present

## 2021-06-10 DIAGNOSIS — D500 Iron deficiency anemia secondary to blood loss (chronic): Secondary | ICD-10-CM | POA: Diagnosis not present

## 2021-06-10 DIAGNOSIS — D509 Iron deficiency anemia, unspecified: Secondary | ICD-10-CM

## 2021-06-10 LAB — CBC WITH DIFFERENTIAL/PLATELET
Abs Immature Granulocytes: 0 10*3/uL (ref 0.00–0.07)
Basophils Absolute: 0.1 10*3/uL (ref 0.0–0.1)
Basophils Relative: 2 %
Eosinophils Absolute: 0.1 10*3/uL (ref 0.0–0.5)
Eosinophils Relative: 5 %
HCT: 33 % — ABNORMAL LOW (ref 36.0–46.0)
Hemoglobin: 9.9 g/dL — ABNORMAL LOW (ref 12.0–15.0)
Immature Granulocytes: 0 %
Lymphocytes Relative: 37 %
Lymphs Abs: 1.2 10*3/uL (ref 0.7–4.0)
MCH: 26.3 pg (ref 26.0–34.0)
MCHC: 30 g/dL (ref 30.0–36.0)
MCV: 87.8 fL (ref 80.0–100.0)
Monocytes Absolute: 0.3 10*3/uL (ref 0.1–1.0)
Monocytes Relative: 9 %
Neutro Abs: 1.5 10*3/uL — ABNORMAL LOW (ref 1.7–7.7)
Neutrophils Relative %: 47 %
Platelets: 281 10*3/uL (ref 150–400)
RBC: 3.76 MIL/uL — ABNORMAL LOW (ref 3.87–5.11)
RDW: 17.5 % — ABNORMAL HIGH (ref 11.5–15.5)
WBC: 3.1 10*3/uL — ABNORMAL LOW (ref 4.0–10.5)
nRBC: 0 % (ref 0.0–0.2)

## 2021-06-10 LAB — IRON AND IRON BINDING CAPACITY (CC-WL,HP ONLY)
Iron: 43 ug/dL (ref 28–170)
Saturation Ratios: 13 % (ref 10.4–31.8)
TIBC: 339 ug/dL (ref 250–450)
UIBC: 296 ug/dL (ref 148–442)

## 2021-06-10 LAB — FERRITIN: Ferritin: 111 ng/mL (ref 11–307)

## 2021-06-10 LAB — SAMPLE TO BLOOD BANK

## 2021-06-10 MED ORDER — DARBEPOETIN ALFA 300 MCG/0.6ML IJ SOSY
300.0000 ug | PREFILLED_SYRINGE | Freq: Once | INTRAMUSCULAR | Status: AC
  Administered 2021-06-10: 300 ug via SUBCUTANEOUS
  Filled 2021-06-10: qty 0.6

## 2021-06-10 NOTE — Progress Notes (Signed)
[facility] OFFICE PROGRESS NOTE  Panosh, Standley Brooking, MD  ASSESSMENT & PLAN:  Symptomatic anemia In the past, the patient had severe iron deficiency anemia secondary to GI bleed In the last 2 times, since her recent treatment with Monoferric, she no longer have iron deficiency but her persistent anemia I reviewed all the test results with the patient Her B12 level is adequate She has normal renal function Iron studies are more than adequate She has relative deficient reticulocytosis and mildly increased sed rate Erythropoietin level is suboptimal only elevated, inappropriately low for the degree of anemia We have discontinued zinc supplement This is likely anemia of chronic disease. The patient denies recent history of bleeding such as epistaxis, hematuria or hematochezia. She is symptomatic from the anemia.  We discussed the risks, benefits, side effects of erythropoietin stimulating agents for anemia, with the goal of keeping the hemoglobin greater than 10 g. I discussed with the patient and potential side effects such as risk of thrombosis, severe hypertension, risk of congestive heart failure and stroke and she agreed to proceed. I will start initiial dosing at 200 mcg every 2 weeks and we will reassess her response rates after 3 treatments to assess whether dosage adjustment is needed.  I will see her again in 2 weeks for further follow-up We will continue to monitor her iron studies closely due to ongoing rectal bleeding   Other hemorrhoids She has ongoing rectal bleeding She has appointment to see gastroenterologist for further procedure We will continue to monitor her iron studies carefully in case we need to give her IV iron periodically  No orders of the defined types were placed in this encounter.   The total time spent in the appointment was 20 minutes encounter with patients including review of chart and various tests results, discussions about plan of care  and coordination of care plan   All questions were answered. The patient knows to call the clinic with any problems, questions or concerns. No barriers to learning was detected.    [name], MD 2/16/202312:36 PM  INTERVAL HISTORY: Belinda Harper 81 y.o. 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 Summary of her history is as follows: This is a pleasant woman with multifactorial anemia and associated leukopenia. 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 The chronic anemia and leukopenia go back as far as we have records (2007) when white counts ran as low as 2700. She has a normal differential. White count average is 3000 and has not changed. Platelet count is normal. Best hemoglobin she achieves with parenteral iron replacement is 11 g. She had a nondiagnostic bone marrow biopsy done in 2006. She had received intravenous iron infusion in the past. Her last colonoscopy in May 2013 showed severe diverticulosis with internal hemorrhoids. She feels well. Denies recent infection. She denies symptoms of fatigue. She tolerated iron supplement well. She had periodic hemorrhoidal bleeding. She was found to have abnormal CBC from intermittently over the years by  her primary care doctor She has started taking oral iron supplements since November of last year Most recently, she have noted passage of dark stool She attempted to increase her oral iron supplement  but still complain of excessive fatigue Recently, her repeat CBC has dropped from 11-8.2 and hence she was referred back here for further evaluation On 12/15/20, she had repeat EGD and colonoscopy which showed: - Severe diverticulosis in the sigmoid colon, in the descending colon, in the transverse colon and in the ascending colon. - Non-bleeding external and internal hemorrhoids. - No specimens collected.  She denies recent chest pain on exertion, shortness of breath on minimal exertion, pre-syncopal episodes, or palpitations.  The patient denies over the counter NSAID ingestion. She is not on antiplatelets agents. Her last colonoscopy was in 2013 She had no prior history or diagnosis of cancer. Her age appropriate screening programs are up-to-date. She denies any pica and eats a variety of diet.  The patient was prescribed oral iron supplements and she takes 1 dose of oral iron supplement at night to avoid constipation From Sept 2021 till present, she has received many doses of intravenous iron infusion On December 15, 2020, she had repeat upper endoscopy evaluation due to severe anemia which showed LA Grade B reflux esophagitis with no bleeding. 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 - Gastritis. Biopsied. - Normal examined duodenum.  Since Monoferric IV iron in October 2022, her iron deficiency resolved On May 28, 2021, she started on Aranesp injection  I have reviewed the past medical history, past surgical history, social history and family history with the patient and they are unchanged from previous note.  ALLERGIES:  is allergic to sulfamethoxazole, nsaids, and sulfa antibiotics.  MEDICATIONS:  Current Outpatient Medications  Medication Sig Dispense Refill   docusate sodium (COLACE) 250 MG capsule Take 250 mg by mouth daily.     Magnesium Glycinate 665 MG CAPS Take 665 mg by mouth daily.     Wheat Dextrin  (BENEFIBER DRINK MIX PO) Take 1 Scoop by mouth 2 (two) times daily.     amLODipine (NORVASC) 5 MG tablet TAKE 1 TABLET (5 MG TOTAL) BY MOUTH DAILY. 90 tablet 3   Calcium Citrate-Vitamin D (CITRACAL + D PO) Take 1 tablet by mouth daily with lunch. 500 mg calcium (2 X 250 mg)     Cholecalciferol (VITAMIN D3 PO) Take 1,000 Units by mouth daily.     magnesium oxide (MAG-OX) 400 MG tablet Take 400 mg by mouth daily.     Menatetrenone (VITAMIN K2) 100 MCG TABS Take 800 mcg by mouth daily.     metoprolol succinate (TOPROL-XL) 25 MG 24 hr tablet Take 0.5 tablets (12.5 mg total) by mouth at bedtime. 45 tablet 3   vitamin C (ASCORBIC ACID) 500 MG tablet Take 500 mg by mouth daily.     No current facility-administered medications for this visit.     REVIEW OF SYSTEMS:   Constitutional: Denies fevers, chills or night sweats Eyes: Denies blurriness of vision Ears, nose, mouth, throat, and face: Denies mucositis or sore throat Respiratory: Denies cough, dyspnea or wheezes Cardiovascular: Denies palpitation, chest discomfort or lower extremity swelling Gastrointestinal:  Denies nausea, heartburn or change in bowel habits Skin: Denies abnormal skin rashes Lymphatics: Denies new lymphadenopathy or easy bruising Neurological:Denies numbness, tingling or new weaknesses Behavioral/Psych: Mood is stable, no new changes  All other systems were reviewed with the patient and are negative.  PHYSICAL  EXAMINATION: ECOG PERFORMANCE STATUS: 1 - Symptomatic but completely ambulatory  Vitals:   06/10/21 1145  BP: (!) 143/48  Temp: 97.7 F (36.5 C)  SpO2: 97%   Filed Weights   06/10/21 1145  Weight: 156 lb (70.8 kg)    GENERAL:alert, no distress and comfortable NEURO: alert & oriented x 3 with fluent speech, no focal motor/sensory deficits  LABORATORY DATA:  I have reviewed the data as listed     Component Value Date/Time   NA 139 05/17/2021 1205   NA 142 05/28/2012 1600   K 3.9 05/17/2021 1205    K 3.7 05/28/2012 1600   CL 103 05/17/2021 1205   CL 103 05/28/2012 1600   CO2 29 05/17/2021 1205   CO2 29 05/28/2012 1600   GLUCOSE 88 05/17/2021 1205   GLUCOSE 99 05/28/2012 1600   BUN 24 (H) 05/17/2021 1205   BUN 19.3 05/28/2012 1600   CREATININE 0.85 05/17/2021 1205   CREATININE 0.88 12/06/2019 1147   CREATININE 0.8 05/28/2012 1600   CALCIUM 9.0 05/17/2021 1205   CALCIUM 9.5 05/28/2012 1600   PROT 7.8 12/09/2020 1443   PROT 7.3 05/28/2012 1600   ALBUMIN 4.4 12/09/2020 1443   ALBUMIN 3.5 05/28/2012 1600   AST 25 12/09/2020 1443   AST 23 05/28/2012 1600   ALT 15 12/09/2020 1443   ALT 13 05/28/2012 1600   ALKPHOS 79 12/09/2020 1443   ALKPHOS 82 05/28/2012 1600   BILITOT 0.3 12/09/2020 1443   BILITOT <0.20 05/28/2012 1600   GFRNONAA >60 05/17/2021 1205   GFRAA >60 04/23/2018 1634    No results found for: SPEP, UPEP  Lab Results  Component Value Date   WBC 3.1 (L) 06/10/2021   NEUTROABS 1.5 (L) 06/10/2021   HGB 9.9 (L) 06/10/2021   HCT 33.0 (L) 06/10/2021   MCV 87.8 06/10/2021   PLT 281 06/10/2021      Chemistry      Component Value Date/Time   NA 139 05/17/2021 1205   NA 142 05/28/2012 1600   K 3.9 05/17/2021 1205   K 3.7 05/28/2012 1600   CL 103 05/17/2021 1205   CL 103 05/28/2012 1600   CO2 29 05/17/2021 1205   CO2 29 05/28/2012 1600   BUN 24 (H) 05/17/2021 1205   BUN 19.3 05/28/2012 1600   CREATININE 0.85 05/17/2021 1205   CREATININE 0.88 12/06/2019 1147   CREATININE 0.8 05/28/2012 1600      Component Value Date/Time   CALCIUM 9.0 05/17/2021 1205   CALCIUM 9.5 05/28/2012 1600   ALKPHOS 79 12/09/2020 1443   ALKPHOS 82 05/28/2012 1600   AST 25 12/09/2020 1443   AST 23 05/28/2012 1600   ALT 15 12/09/2020 1443   ALT 13 05/28/2012 1600   BILITOT 0.3 12/09/2020 1443   BILITOT <0.20 05/28/2012 1600

## 2021-06-10 NOTE — Assessment & Plan Note (Signed)
In the past, the patient had severe iron deficiency anemia secondary to GI bleed In the last 2 times, since her recent treatment with Monoferric, she no longer have iron deficiency but her persistent anemia I reviewed all the test results with the patient Her B12 level is adequate She has normal renal function Iron studies are more than adequate She has relative deficient reticulocytosis and mildly increased sed rate Erythropoietin level is suboptimal only elevated, inappropriately low for the degree of anemia We have discontinued zinc supplement This is likely anemia of chronic disease. The patient denies recent history of bleeding such as epistaxis, hematuria or hematochezia. She is symptomatic from the anemia.  We discussed the risks, benefits, side effects of erythropoietin stimulating agents for anemia, with the goal of keeping the hemoglobin greater than 10 g. I discussed with the patient and potential side effects such as risk of thrombosis, severe hypertension, risk of congestive heart failure and stroke and she agreed to proceed. I will start initiial dosing at 200 mcg every 2 weeks and we will reassess her response rates after 3 treatments to assess whether dosage adjustment is needed.  I will see her again in 2 weeks for further follow-up We will continue to monitor her iron studies closely due to ongoing rectal bleeding

## 2021-06-10 NOTE — Assessment & Plan Note (Signed)
She has ongoing rectal bleeding She has appointment to see gastroenterologist for further procedure We will continue to monitor her iron studies carefully in case we need to give her IV iron periodically

## 2021-06-10 NOTE — Telephone Encounter (Signed)
Called and left a message on both #'s.  No need for IV iron at this time. Dr. Alvy Bimler sent a scheduling message to schedule injection in 2 weeks.

## 2021-06-11 ENCOUNTER — Telehealth: Payer: Self-pay | Admitting: Hematology and Oncology

## 2021-06-11 NOTE — Telephone Encounter (Signed)
Scheduled appointment per 02/16 los. Patient aware.

## 2021-06-18 ENCOUNTER — Encounter: Payer: Self-pay | Admitting: Gastroenterology

## 2021-06-18 ENCOUNTER — Ambulatory Visit (INDEPENDENT_AMBULATORY_CARE_PROVIDER_SITE_OTHER): Payer: Medicare Other | Admitting: Gastroenterology

## 2021-06-18 VITALS — BP 126/66 | HR 64 | Ht 66.0 in | Wt 160.0 lb

## 2021-06-18 DIAGNOSIS — K625 Hemorrhage of anus and rectum: Secondary | ICD-10-CM | POA: Diagnosis not present

## 2021-06-18 DIAGNOSIS — K642 Third degree hemorrhoids: Secondary | ICD-10-CM | POA: Diagnosis not present

## 2021-06-18 NOTE — Progress Notes (Signed)
PROCEDURE NOTE: The patient presents with symptomatic grade II-III  hemorrhoids, requesting rubber band ligation of his/her hemorrhoidal disease.  All risks, benefits and alternative forms of therapy were described and informed consent was obtained.  In the Left Lateral Decubitus position anoscopic examination revealed grade II-III hemorrhoids in the left lateral and right posterior position(s).  The anorectum was pre-medicated with 0.125% Nitroglycerine and Recticare The decision was made to band the left lateral internal hemorrhoid, and the [place] was used to perform band ligation without complication.  Digital anorectal examination was then performed to assure proper positioning of the band, and to adjust the banded tissue as required.  The patient was discharged home without pain or other issues.  Dietary and behavioral recommendations were given and along with follow-up instructions.     The following adjunctive treatments were recommended:  Benefiber 1 tablespoon twice daily as tolerated Colace daily as needed Kegel's exercise  The patient will return in 6-8 weeks for  follow-up and possible additional banding as required. No complications were encountered and the patient tolerated the procedure well.   Damaris Hippo , MD [phone]

## 2021-06-18 NOTE — Patient Instructions (Signed)
HEMORRHOID BANDING PROCEDURE    FOLLOW-UP CARE   The procedure you have had should have been relatively painless since the banding of the area involved does not have nerve endings and there is no pain sensation.  The rubber band cuts off the blood supply to the hemorrhoid and the band may fall off as soon as 48 hours after the banding (the band may occasionally be seen in the toilet bowl following a bowel movement). You may notice a temporary feeling of fullness in the rectum which should respond adequately to plain Tylenol or Motrin.  Following the banding, avoid strenuous exercise that evening and resume full activity the next day.  A sitz bath (soaking in a warm tub) or bidet is soothing, and can be useful for cleansing the area after bowel movements.     To avoid constipation, take two tablespoons of natural wheat bran, natural oat bran, flax, Benefiber or any over the counter fiber supplement and increase your water intake to 7-8 glasses daily.    Unless you have been prescribed anorectal medication, do not put anything inside your rectum for two weeks: No suppositories, enemas, fingers, etc.  Occasionally, you may have more bleeding than usual after the banding procedure.  This is often from the untreated hemorrhoids rather than the treated one.  Dont be concerned if there is a tablespoon or so of blood.  If there is more blood than this, lie flat with your bottom higher than your head and apply an ice pack to the area. If the bleeding does not stop within a half an hour or if you feel faint, call our office at [phone] or go to the emergency room.  Problems are not common; however, if there is a substantial amount of bleeding, severe pain, chills, fever or difficulty passing urine (very rare) or other problems, you should call us at [phone] or report to the nearest emergency room.  Do not stay seated continuously for more than 2-3 hours for a day or two after the procedure.   Tighten your buttock muscles 10-15 times every two hours and take 10-15 deep breaths every 1-2 hours.  Do not spend more than a few minutes on the toilet if you cannot empty your bowel; instead re-visit the toilet at a later time.    Continue using Anusol suppositories as needed.   Follow-up in 2 months. Office to call and schedule at a later time. .  Thank you for choosing me and [facility].  Dr. Silverio Decamp

## 2021-06-25 ENCOUNTER — Inpatient Hospital Stay (HOSPITAL_BASED_OUTPATIENT_CLINIC_OR_DEPARTMENT_OTHER): Payer: Medicare Other | Admitting: Hematology and Oncology

## 2021-06-25 ENCOUNTER — Inpatient Hospital Stay: Payer: Medicare Other

## 2021-06-25 ENCOUNTER — Encounter: Payer: Self-pay | Admitting: Hematology and Oncology

## 2021-06-25 ENCOUNTER — Other Ambulatory Visit: Payer: Self-pay

## 2021-06-25 ENCOUNTER — Inpatient Hospital Stay: Payer: Medicare Other | Attending: Hematology and Oncology

## 2021-06-25 ENCOUNTER — Other Ambulatory Visit: Payer: Self-pay | Admitting: Hematology and Oncology

## 2021-06-25 VITALS — BP 136/56 | HR 86 | Temp 97.4°F | Resp 18 | Ht 66.0 in | Wt 158.8 lb

## 2021-06-25 VITALS — BP 142/49 | HR 60 | Temp 98.3°F | Resp 18

## 2021-06-25 DIAGNOSIS — K648 Other hemorrhoids: Secondary | ICD-10-CM

## 2021-06-25 DIAGNOSIS — D500 Iron deficiency anemia secondary to blood loss (chronic): Secondary | ICD-10-CM

## 2021-06-25 DIAGNOSIS — D649 Anemia, unspecified: Secondary | ICD-10-CM | POA: Diagnosis not present

## 2021-06-25 DIAGNOSIS — K922 Gastrointestinal hemorrhage, unspecified: Secondary | ICD-10-CM | POA: Insufficient documentation

## 2021-06-25 DIAGNOSIS — D72819 Decreased white blood cell count, unspecified: Secondary | ICD-10-CM | POA: Insufficient documentation

## 2021-06-25 DIAGNOSIS — D539 Nutritional anemia, unspecified: Secondary | ICD-10-CM

## 2021-06-25 DIAGNOSIS — D509 Iron deficiency anemia, unspecified: Secondary | ICD-10-CM

## 2021-06-25 LAB — CBC WITH DIFFERENTIAL/PLATELET
Abs Immature Granulocytes: 0 10*3/uL (ref 0.00–0.07)
Basophils Absolute: 0 10*3/uL (ref 0.0–0.1)
Basophils Relative: 1 %
Eosinophils Absolute: 0.2 10*3/uL (ref 0.0–0.5)
Eosinophils Relative: 7 %
HCT: 25.3 % — ABNORMAL LOW (ref 36.0–46.0)
Hemoglobin: 7.3 g/dL — ABNORMAL LOW (ref 12.0–15.0)
Immature Granulocytes: 0 %
Lymphocytes Relative: 39 %
Lymphs Abs: 1.2 10*3/uL (ref 0.7–4.0)
MCH: 25 pg — ABNORMAL LOW (ref 26.0–34.0)
MCHC: 28.9 g/dL — ABNORMAL LOW (ref 30.0–36.0)
MCV: 86.6 fL (ref 80.0–100.0)
Monocytes Absolute: 0.3 10*3/uL (ref 0.1–1.0)
Monocytes Relative: 10 %
Neutro Abs: 1.4 10*3/uL — ABNORMAL LOW (ref 1.7–7.7)
Neutrophils Relative %: 43 %
Platelets: 239 10*3/uL (ref 150–400)
RBC: 2.92 MIL/uL — ABNORMAL LOW (ref 3.87–5.11)
RDW: 18.6 % — ABNORMAL HIGH (ref 11.5–15.5)
WBC: 3.2 10*3/uL — ABNORMAL LOW (ref 4.0–10.5)
nRBC: 0 % (ref 0.0–0.2)

## 2021-06-25 LAB — SAMPLE TO BLOOD BANK

## 2021-06-25 LAB — PREPARE RBC (CROSSMATCH)

## 2021-06-25 LAB — FERRITIN: Ferritin: 63 ng/mL (ref 11–307)

## 2021-06-25 LAB — IRON AND IRON BINDING CAPACITY (CC-WL,HP ONLY)
Iron: 29 ug/dL (ref 28–170)
Saturation Ratios: 9 % — ABNORMAL LOW (ref 10.4–31.8)
TIBC: 329 ug/dL (ref 250–450)
UIBC: 300 ug/dL (ref 148–442)

## 2021-06-25 MED ORDER — SODIUM CHLORIDE 0.9% IV SOLUTION
250.0000 mL | Freq: Once | INTRAVENOUS | Status: AC
  Administered 2021-06-25: 250 mL via INTRAVENOUS

## 2021-06-25 MED ORDER — DARBEPOETIN ALFA 300 MCG/0.6ML IJ SOSY
300.0000 ug | PREFILLED_SYRINGE | Freq: Once | INTRAMUSCULAR | Status: AC
  Administered 2021-06-25: 300 ug via SUBCUTANEOUS
  Filled 2021-06-25: qty 0.6

## 2021-06-25 MED ORDER — ACETAMINOPHEN 325 MG PO TABS
650.0000 mg | ORAL_TABLET | Freq: Once | ORAL | Status: AC
  Administered 2021-06-25: 650 mg via ORAL
  Filled 2021-06-25: qty 2

## 2021-06-25 MED ORDER — DIPHENHYDRAMINE HCL 25 MG PO CAPS
25.0000 mg | ORAL_CAPSULE | Freq: Once | ORAL | Status: AC
  Administered 2021-06-25: 25 mg via ORAL
  Filled 2021-06-25: qty 1

## 2021-06-25 NOTE — Assessment & Plan Note (Signed)
She had recent GI procedure ?She has ongoing GI bleed ?I will reach out to her gastroenterologist to see if there is any other treatment available ?

## 2021-06-25 NOTE — Patient Instructions (Signed)

## 2021-06-25 NOTE — Progress Notes (Signed)
Belinda Harper  Panosh, Standley Brooking, MD  ASSESSMENT & PLAN:  Symptomatic anemia She is symptomatic again with significant anemia Repeat blood work and iron studies confirm iron deficiency, likely due to ongoing GI bleed She had recent procedure to treat her hemorrhoid I will reach out to her gastroenterologist to see if there is any other procedure that needs to be done In the meantime, I recommend blood and iron as injection today We will also schedule IV iron treatment I will see her again in a few weeks for further follow-up  We discussed some of the risks, benefits, and alternatives of blood transfusions. The patient is symptomatic from anemia and the hemoglobin level is critically low.  Some of the side-effects to be expected including risks of transfusion reactions, chills, infection, syndrome of volume overload and risk of hospitalization from various reasons and the patient is willing to proceed and went ahead to sign consent today.  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 We discussed some of the risks, benefits, and alternatives of intravenous iron infusions. The patient is symptomatic from anemia and the iron level is critically low. She tolerated oral iron supplement poorly and desires to achieved higher levels of iron faster for adequate hematopoesis. Some of the side-effects to be expected including risks of infusion reactions, phlebitis, headaches, nausea and fatigue.  The patient is willing to proceed. Patient education material was dispensed.  Goal is to keep ferritin level greater than 50 and resolution of anemia I will try to schedule a few doses of IV iron sucrose If she has no response, we will try to get insurance prior authorization again to switch her to Monoferric   Other hemorrhoids She had recent GI procedure She has ongoing GI bleed I will reach out to her gastroenterologist to see  if there is any other treatment available  Orders Placed This Encounter  Procedures   Care order/instruction    Transfuse Parameters    Standing Status:   Future    Standing Expiration Date:   06/25/2022   Informed Consent Details: Physician/Practitioner Attestation; Transcribe to consent form and obtain patient signature    Standing Status:   Future    Standing Expiration Date:   06/26/2022    Order Specific Question:   Physician/Practitioner attestation of informed consent for blood and or blood product transfusion    Answer:   I, the physician/practitioner, attest that I have discussed with the patient the benefits, risks, side effects, alternatives, likelihood of achieving goals and potential problems during recovery for the procedure that I have provided informed consent.    Order Specific Question:   Product(s)    Answer:   All Product(s)   Type and screen         Standing Status:   Future    Number of Occurrences:   1    Standing Expiration Date:   06/26/2022   Prepare RBC (crossmatch)    Standing Status:   Standing    Number of Occurrences:   1    Order Specific Question:   # of Units    Answer:   1 unit    Order Specific Question:   Transfusion Indications    Answer:   Symptomatic Anemia    Order Specific Question:   Number of Units to Keep Ahead    Answer:   NO units ahead    Order Specific Question:   Instructions:  Answer:   Transfuse    Order Specific Question:   If emergent release call blood bank    Answer:   Not emergent release    The total time spent in the appointment was 40 minutes encounter with patients including review of chart and various tests results, discussions about plan of care and coordination of care plan   All questions were answered. The patient knows to call the clinic with any problems, questions or concerns. No barriers to learning was detected.    [name], MD 3/3/202312:18 PM  INTERVAL HISTORY: Belinda Harper 81 y.o. 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 Summary of her history is as follows: This is a pleasant woman with multifactorial anemia and associated leukopenia. 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 The chronic anemia and leukopenia go back as far as we have records (2007) when white counts ran as low as 2700. She has a normal differential. White count average is 3000 and has not changed. Platelet count is normal. Best hemoglobin she achieves with parenteral iron replacement is 11 g. She had a nondiagnostic bone marrow biopsy done in 2006. She had received intravenous iron infusion in the past. Her last colonoscopy in May 2013 showed severe diverticulosis with internal hemorrhoids. She feels well. Denies recent infection. She denies symptoms of fatigue. She tolerated iron supplement well. She had periodic hemorrhoidal bleeding. She was found to have abnormal CBC from intermittently over the years by her primary care doctor She has started taking oral iron supplements since November of last year Most recently, she have noted passage of dark stool She attempted to increase her oral iron supplement but still complain of excessive fatigue Recently, her repeat CBC has dropped from 11-8.2 and hence she was referred back here for further evaluation On 12/15/20, she had repeat EGD and colonoscopy which showed: - Severe diverticulosis in the sigmoid colon, in the descending  colon, in the transverse colon and in the ascending colon. - Non-bleeding external and internal hemorrhoids. - No specimens collected.  She denies recent chest pain on exertion, shortness of breath on minimal exertion, pre-syncopal episodes, or palpitations.  The patient denies over the counter NSAID ingestion. She is not on antiplatelets agents. Her last colonoscopy was in 2013 She had no prior history or diagnosis of cancer. Her age appropriate screening programs are up-to-date. She denies any pica and eats a variety of diet.  The patient was prescribed oral iron supplements and she takes 1 dose of oral iron supplement at night to avoid constipation From Sept 2021 till present, she has received many doses of intravenous iron infusion On December 15, 2020, she had repeat upper endoscopy evaluation due to severe anemia which showed LA Grade B reflux esophagitis with no bleeding. 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 - Gastritis. Biopsied. - Normal examined duodenum.  Since Monoferric IV iron in October 2022, her iron deficiency resolved On  May 28, 2021, she started on Aranesp injection  On 06/18/21, she had repeat GI procedure PROCEDURE Harper: The patient presents with symptomatic grade II-III  hemorrhoids, requesting rubber band ligation of his/her hemorrhoidal disease.  All risks, benefits and alternative forms of therapy were described and informed consent was obtained.   In the Left Lateral Decubitus position anoscopic examination revealed grade II-III hemorrhoids in the left lateral and right posterior position(s).  The anorectum was pre-medicated with 0.125% Nitroglycerine and Recticare The decision was made to band the left lateral internal hemorrhoid, and the [place] was used to perform band ligation without complication.  Digital anorectal examination was then performed to assure proper positioning of the  band, and to adjust the banded tissue as required. The patient was discharged home without pain or other issues.  Dietary and behavioral recommendations were given and along with follow-up instructions.     I have reviewed the past medical history, past surgical history, social history and family history with the patient and they are unchanged from previous Harper.  ALLERGIES:  is allergic to sulfamethoxazole, nsaids, and sulfa antibiotics.  MEDICATIONS:  Current Outpatient Medications  Medication Sig Dispense Refill   amLODipine (NORVASC) 5 MG tablet TAKE 1 TABLET (5 MG TOTAL) BY MOUTH DAILY. 90 tablet 3   Calcium Citrate-Vitamin D (CITRACAL + D PO) Take 1 tablet by mouth daily with lunch. 800 mg calcium     Cholecalciferol (VITAMIN D3 PO) Take 1,000 Units by mouth daily.     denosumab (PROLIA) 60 MG/ML SOSY injection Inject 60 mg into the skin every 6 (six) months.     docusate sodium (COLACE) 250 MG capsule Take 250 mg by mouth daily.     Magnesium Glycinate 665 MG CAPS Take 665 mg by mouth daily.     Menatetrenone (VITAMIN K2) 100 MCG TABS Take 800 mcg by mouth daily.     metoprolol succinate (TOPROL-XL) 25 MG 24 hr tablet Take 0.5 tablets (12.5 mg total) by mouth at bedtime. 45 tablet 3   vitamin C (ASCORBIC ACID) 500 MG tablet Take 500 mg by mouth daily.     Wheat Dextrin (BENEFIBER DRINK MIX PO) Take 1 Scoop by mouth daily at 2 PM.     No current facility-administered medications for this visit.     REVIEW OF SYSTEMS:   Constitutional: Denies fevers, chills or night sweats Eyes: Denies blurriness of vision Ears, nose, mouth, throat, and face: Denies mucositis or sore throat Respiratory: Denies cough, dyspnea or wheezes Cardiovascular: Denies palpitation, chest discomfort or lower extremity swelling Gastrointestinal:  Denies nausea, heartburn or change in bowel habits Skin: Denies abnormal skin rashes Lymphatics: Denies new lymphadenopathy or easy bruising Neurological:Denies  numbness, tingling or new weaknesses Behavioral/Psych: Mood is stable, no new changes  All other systems were reviewed with the patient and are negative.  PHYSICAL EXAMINATION: ECOG PERFORMANCE STATUS: 1 - Symptomatic but completely ambulatory  Vitals:   06/25/21 0832  BP: (!) 136/56  Pulse: 86  Resp: 18  Temp: (!) 97.4 F (36.3 C)  SpO2: 100%   Filed Weights   06/25/21 0832  Weight: 158 lb 12.8 oz (72 kg)    GENERAL:alert, no distress and comfortable.  She looks very pale NEURO: alert & oriented x 3 with fluent speech, no focal motor/sensory deficits  LABORATORY DATA:  I have reviewed the data as listed     Component Value Date/Time   NA 139 05/17/2021 1205   NA 142 05/28/2012 1600  K 3.9 05/17/2021 1205   K 3.7 05/28/2012 1600   CL 103 05/17/2021 1205   CL 103 05/28/2012 1600   CO2 29 05/17/2021 1205   CO2 29 05/28/2012 1600   GLUCOSE 88 05/17/2021 1205   GLUCOSE 99 05/28/2012 1600   BUN 24 (H) 05/17/2021 1205   BUN 19.3 05/28/2012 1600   CREATININE 0.85 05/17/2021 1205   CREATININE 0.88 12/06/2019 1147   CREATININE 0.8 05/28/2012 1600   CALCIUM 9.0 05/17/2021 1205   CALCIUM 9.5 05/28/2012 1600   PROT 7.8 12/09/2020 1443   PROT 7.3 05/28/2012 1600   ALBUMIN 4.4 12/09/2020 1443   ALBUMIN 3.5 05/28/2012 1600   AST 25 12/09/2020 1443   AST 23 05/28/2012 1600   ALT 15 12/09/2020 1443   ALT 13 05/28/2012 1600   ALKPHOS 79 12/09/2020 1443   ALKPHOS 82 05/28/2012 1600   BILITOT 0.3 12/09/2020 1443   BILITOT <0.20 05/28/2012 1600   GFRNONAA >60 05/17/2021 1205   GFRAA >60 04/23/2018 1634    No results found for: SPEP, UPEP  Lab Results  Component Value Date   WBC 3.2 (L) 06/25/2021   NEUTROABS 1.4 (L) 06/25/2021   HGB 7.3 (L) 06/25/2021   HCT 25.3 (L) 06/25/2021   MCV 86.6 06/25/2021   PLT 239 06/25/2021      Chemistry      Component Value Date/Time   NA 139 05/17/2021 1205   NA 142 05/28/2012 1600   K 3.9 05/17/2021 1205   K 3.7 05/28/2012  1600   CL 103 05/17/2021 1205   CL 103 05/28/2012 1600   CO2 29 05/17/2021 1205   CO2 29 05/28/2012 1600   BUN 24 (H) 05/17/2021 1205   BUN 19.3 05/28/2012 1600   CREATININE 0.85 05/17/2021 1205   CREATININE 0.88 12/06/2019 1147   CREATININE 0.8 05/28/2012 1600      Component Value Date/Time   CALCIUM 9.0 05/17/2021 1205   CALCIUM 9.5 05/28/2012 1600   ALKPHOS 79 12/09/2020 1443   ALKPHOS 82 05/28/2012 1600   AST 25 12/09/2020 1443   AST 23 05/28/2012 1600   ALT 15 12/09/2020 1443   ALT 13 05/28/2012 1600   BILITOT 0.3 12/09/2020 1443   BILITOT <0.20 05/28/2012 1600

## 2021-06-25 NOTE — Assessment & Plan Note (Signed)
She is symptomatic again with significant anemia ?Repeat blood work and iron studies confirm iron deficiency, likely due to ongoing GI bleed ?She had recent procedure to treat her hemorrhoid ?I will reach out to her gastroenterologist to see if there is any other procedure that needs to be done ?In the meantime, I recommend blood and iron as injection today ?We will also schedule IV iron treatment ?I will see her again in a few weeks for further follow-up ? ?We discussed some of the risks, benefits, and alternatives of blood transfusions. The patient is symptomatic from anemia and the hemoglobin level is critically low.  Some of the side-effects to be expected including risks of transfusion reactions, chills, infection, syndrome of volume overload and risk of hospitalization from various reasons and the patient is willing to proceed and went ahead to sign consent today. ? 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 ?We discussed some of the risks, benefits, and alternatives of intravenous iron infusions. The patient is symptomatic from anemia and the iron level is critically low. She tolerated oral iron supplement poorly and desires to achieved higher levels of iron faster for adequate hematopoesis. Some of the side-effects to be expected including risks of infusion reactions, phlebitis, headaches, nausea and fatigue.  The patient is willing to proceed. Patient education material was dispensed.  ?Goal is to keep ferritin level greater than 50 and resolution of anemia ?I will try to schedule a few doses of IV iron sucrose ?If she has no response, we will try to get insurance prior authorization again to switch her to Monoferric ? ?

## 2021-06-28 LAB — TYPE AND SCREEN
ABO/RH(D): O POS
Antibody Screen: NEGATIVE
Unit division: 0

## 2021-06-28 LAB — BPAM RBC
Blood Product Expiration Date: 202304042359
ISSUE DATE / TIME: 202303031014
Unit Type and Rh: 5100

## 2021-06-29 ENCOUNTER — Inpatient Hospital Stay (HOSPITAL_BASED_OUTPATIENT_CLINIC_OR_DEPARTMENT_OTHER): Payer: Medicare Other

## 2021-06-29 ENCOUNTER — Other Ambulatory Visit: Payer: Self-pay

## 2021-06-29 VITALS — BP 154/61 | HR 60 | Temp 98.3°F | Resp 18

## 2021-06-29 DIAGNOSIS — D539 Nutritional anemia, unspecified: Secondary | ICD-10-CM

## 2021-06-29 DIAGNOSIS — D500 Iron deficiency anemia secondary to blood loss (chronic): Secondary | ICD-10-CM | POA: Diagnosis not present

## 2021-06-29 DIAGNOSIS — D649 Anemia, unspecified: Secondary | ICD-10-CM

## 2021-06-29 MED ORDER — SODIUM CHLORIDE 0.9 % IV SOLN: Freq: Once | INTRAVENOUS | Status: AC

## 2021-06-29 MED ORDER — SODIUM CHLORIDE 0.9 % IV SOLN
400.0000 mg | Freq: Once | INTRAVENOUS | Status: AC
  Administered 2021-06-29: 400 mg via INTRAVENOUS
  Filled 2021-06-29: qty 20

## 2021-06-29 NOTE — Patient Instructions (Signed)

## 2021-06-29 NOTE — Progress Notes (Signed)
Pt declined to stay for 30 minute observation period post infusion. VSS. No complaints at time of discharge.  ?

## 2021-07-01 ENCOUNTER — Encounter: Payer: Self-pay | Admitting: Gastroenterology

## 2021-07-02 ENCOUNTER — Other Ambulatory Visit: Payer: Self-pay

## 2021-07-02 ENCOUNTER — Inpatient Hospital Stay: Payer: Medicare Other

## 2021-07-02 VITALS — BP 119/56 | HR 81 | Temp 98.1°F | Resp 18

## 2021-07-02 DIAGNOSIS — D500 Iron deficiency anemia secondary to blood loss (chronic): Secondary | ICD-10-CM | POA: Diagnosis not present

## 2021-07-02 DIAGNOSIS — D649 Anemia, unspecified: Secondary | ICD-10-CM

## 2021-07-02 DIAGNOSIS — D539 Nutritional anemia, unspecified: Secondary | ICD-10-CM

## 2021-07-02 MED ORDER — SODIUM CHLORIDE 0.9 % IV SOLN
400.0000 mg | Freq: Once | INTRAVENOUS | Status: AC
  Administered 2021-07-02: 400 mg via INTRAVENOUS
  Filled 2021-07-02: qty 20

## 2021-07-02 MED ORDER — SODIUM CHLORIDE 0.9 % IV SOLN: Freq: Once | INTRAVENOUS | Status: AC

## 2021-07-02 NOTE — Patient Instructions (Signed)

## 2021-07-05 ENCOUNTER — Inpatient Hospital Stay: Payer: Medicare Other

## 2021-07-05 ENCOUNTER — Other Ambulatory Visit: Payer: Self-pay

## 2021-07-05 VITALS — BP 138/68 | HR 58 | Temp 98.2°F | Resp 16 | Wt 158.2 lb

## 2021-07-05 DIAGNOSIS — D649 Anemia, unspecified: Secondary | ICD-10-CM

## 2021-07-05 DIAGNOSIS — D500 Iron deficiency anemia secondary to blood loss (chronic): Secondary | ICD-10-CM | POA: Diagnosis not present

## 2021-07-05 DIAGNOSIS — D539 Nutritional anemia, unspecified: Secondary | ICD-10-CM

## 2021-07-05 MED ORDER — SODIUM CHLORIDE 0.9 % IV SOLN: Freq: Once | INTRAVENOUS | Status: AC

## 2021-07-05 MED ORDER — SODIUM CHLORIDE 0.9 % IV SOLN
400.0000 mg | Freq: Once | INTRAVENOUS | Status: AC
  Administered 2021-07-05: 400 mg via INTRAVENOUS
  Filled 2021-07-05: qty 20

## 2021-07-05 NOTE — Progress Notes (Signed)
Pt declined to stay for full 30 minutes.  VS were stable at discharge ?

## 2021-07-05 NOTE — Patient Instructions (Signed)

## 2021-07-09 ENCOUNTER — Ambulatory Visit: Payer: Medicare Other

## 2021-07-15 ENCOUNTER — Ambulatory Visit: Payer: Medicare Other

## 2021-07-16 ENCOUNTER — Inpatient Hospital Stay: Payer: Medicare Other

## 2021-07-16 ENCOUNTER — Other Ambulatory Visit: Payer: Self-pay | Admitting: Hematology and Oncology

## 2021-07-16 ENCOUNTER — Encounter: Payer: Self-pay | Admitting: Hematology and Oncology

## 2021-07-16 ENCOUNTER — Inpatient Hospital Stay (HOSPITAL_BASED_OUTPATIENT_CLINIC_OR_DEPARTMENT_OTHER): Payer: Medicare Other | Admitting: Hematology and Oncology

## 2021-07-16 ENCOUNTER — Other Ambulatory Visit: Payer: Self-pay

## 2021-07-16 DIAGNOSIS — D539 Nutritional anemia, unspecified: Secondary | ICD-10-CM

## 2021-07-16 DIAGNOSIS — D649 Anemia, unspecified: Secondary | ICD-10-CM

## 2021-07-16 DIAGNOSIS — D72819 Decreased white blood cell count, unspecified: Secondary | ICD-10-CM

## 2021-07-16 DIAGNOSIS — D509 Iron deficiency anemia, unspecified: Secondary | ICD-10-CM

## 2021-07-16 DIAGNOSIS — K642 Third degree hemorrhoids: Secondary | ICD-10-CM

## 2021-07-16 DIAGNOSIS — D500 Iron deficiency anemia secondary to blood loss (chronic): Secondary | ICD-10-CM | POA: Diagnosis not present

## 2021-07-16 LAB — CBC WITH DIFFERENTIAL/PLATELET
Abs Immature Granulocytes: 0 10*3/uL (ref 0.00–0.07)
Basophils Absolute: 0.1 10*3/uL (ref 0.0–0.1)
Basophils Relative: 2 %
Eosinophils Absolute: 0.3 10*3/uL (ref 0.0–0.5)
Eosinophils Relative: 10 %
HCT: 37 % (ref 36.0–46.0)
Hemoglobin: 10.7 g/dL — ABNORMAL LOW (ref 12.0–15.0)
Immature Granulocytes: 0 %
Lymphocytes Relative: 38 %
Lymphs Abs: 1.2 10*3/uL (ref 0.7–4.0)
MCH: 25.7 pg — ABNORMAL LOW (ref 26.0–34.0)
MCHC: 28.9 g/dL — ABNORMAL LOW (ref 30.0–36.0)
MCV: 88.7 fL (ref 80.0–100.0)
Monocytes Absolute: 0.3 10*3/uL (ref 0.1–1.0)
Monocytes Relative: 10 %
Neutro Abs: 1.2 10*3/uL — ABNORMAL LOW (ref 1.7–7.7)
Neutrophils Relative %: 40 %
Platelets: 194 10*3/uL (ref 150–400)
RBC: 4.17 MIL/uL (ref 3.87–5.11)
RDW: 19.9 % — ABNORMAL HIGH (ref 11.5–15.5)
WBC: 3.1 10*3/uL — ABNORMAL LOW (ref 4.0–10.5)
nRBC: 0 % (ref 0.0–0.2)

## 2021-07-16 LAB — SAMPLE TO BLOOD BANK

## 2021-07-16 MED ORDER — SODIUM CHLORIDE 0.9 % IV SOLN: Freq: Once | INTRAVENOUS | Status: AC

## 2021-07-16 MED ORDER — SODIUM CHLORIDE 0.9 % IV SOLN
400.0000 mg | Freq: Once | INTRAVENOUS | Status: AC
  Administered 2021-07-16: 400 mg via INTRAVENOUS
  Filled 2021-07-16: qty 20

## 2021-07-16 NOTE — Assessment & Plan Note (Signed)
She has chronic hemorrhoidal bleeding despite multiple GI procedures ?She continues to have ongoing GI bleed ?She has appointment to see her gastroenterologist end of May ?We will try to reach out to her gastroenterologist to see if an earlier appointment is warranted ?In the meantime, I will continue transfusion and IV iron support ?

## 2021-07-16 NOTE — Progress Notes (Signed)
? ?[facility] ?OFFICE PROGRESS NOTE ? ?Panosh, Standley Brooking, MD ? ?ASSESSMENT & PLAN:  ?Symptomatic anemia ?She has multifactorial anemia, secondary to chronic GI bleed causing iron deficiency as well as slight inappropriate erythropoiesis ?She does not need blood transfusion today ?Despite 3 doses of intravenous iron sucrose, while she has improvement of her hemoglobin, it is not normal ?She continues to have symptomatic GI bleed on a regular basis ?I will schedule another dose of IV iron sucrose today and additional 2 more doses in the next 2 weeks ?I will see her again in mid April for evaluation ? ?Hemorrhoids ?She has chronic hemorrhoidal bleeding despite multiple GI procedures ?She continues to have ongoing GI bleed ?She has appointment to see her gastroenterologist end of May ?We will try to reach out to her gastroenterologist to see if an earlier appointment is warranted ?In the meantime, I will continue transfusion and IV iron support ? ?Leukopenia ?She has chronic intermittent leukopenia ?This has been present since 2007, unknown etiology ?Recent repeat B12 level in July was adequate ?She is not symptomatic ?Observe for now ? ?No orders of the defined types were placed in this encounter. ? ? ?The total time spent in the appointment was 30 minutes encounter with patients including review of chart and various tests results, discussions about plan of care and coordination of care plan ? ? All questions were answered. The patient knows to call the clinic with any problems, questions or concerns. No barriers to learning was detected. ? ? ? [name], MD ?3/24/20238:57 AM ? ?INTERVAL HISTORY: ?Belinda Harper 81 y.o. 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 ?SUMMARY OF HEMATOLOGIC HISTORY: ? 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 Summary of her history is as follows: ?This is a pleasant woman with multifactorial anemia and associated leukopenia. 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 The chronic anemia and leukopenia go back as far as we have records (2007) when white counts ran as low as 2700. She has a normal differential. White count average is 3000 and has not changed. Platelet count is normal. Best hemoglobin she achieves with parenteral iron replacement is 11 g. She had a nondiagnostic bone marrow biopsy done in 2006. ?She had received intravenous iron infusion in the past. Her last colonoscopy in May 2013 showed severe diverticulosis with internal hemorrhoids. ?She feels well. ?Denies recent infection. ?She denies symptoms of fatigue. ?She tolerated iron supplement well. ?She had periodic hemorrhoidal bleeding. ?She was found to have abnormal CBC from intermittently over the years by her primary care doctor ?She has started taking oral iron supplements since November of last year ?Most recently, she have noted passage of dark stool ?She attempted to increase her oral iron supplement but still complain of excessive fatigue ?Recently, her repeat CBC has dropped from 11-8.2 and hence she was referred back here for further evaluation ?On 12/15/20, she had repeat  EGD and colonoscopy which showed: ?- Severe diverticulosis in the sigmoid colon, in the descending colon, in the transverse colon and in the ascending colon. ?- Non-bleeding external and internal hemorrhoids. ?- No specimens  collected. ? ?She denies recent chest pain on exertion, shortness of breath on minimal exertion, pre-syncopal episodes, or palpitations. ? ?The patient denies over the counter NSAID ingestion. She is not on antiplatelets agents. ?Her last colonoscopy was in 2013 ?She had no prior history or diagnosis of cancer. Her age appropriate screening programs are up-to-date. ?She denies any pica and eats a variety of diet. ? ?The patient was prescribed oral iron supplements and she takes 1 dose of oral iron supplement at night to avoid constipation ?From Sept 2021 till present, she has received many doses of intravenous iron infusion ?On December 15, 2020, she had repeat upper endoscopy evaluation due to severe anemia which showed ?LA Grade B reflux esophagitis with no bleeding. 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 ?- Gastritis. Biopsied. ?- Normal examined duodenum. ? ?Since Monoferric IV iron in October 2022, her iron deficiency resolved ?On May 28, 2021, she started on Aranesp injection ? ?On 06/18/21, she had repeat GI procedure ?PROCEDURE NOTE: ?The patient presents with symptomatic grade II-III  hemorrhoids, requesting rubber band ligation of his/her hemorrhoidal disease.  All risks, benefits and alternative forms of therapy were described and informed consent was obtained. ?  ?In the Left Lateral Decubitus position anoscopic examination revealed grade II-III hemorrhoids in the left lateral and right posterior position(s).  ?The anorectum was pre-medicated with 0.125% Nitroglycerine and Recticare ?The decision was made to band the left lateral internal hemorrhoid, and the [place] O?Regan System was used to perform band ligation without complication.  ?Digital anorectal examination was then performed to assure proper positioning of the band, and to adjust the banded tissue as required. ?The patient was discharged home without pain or other issues.  Dietary and  behavioral recommendations were given and along with follow-up instructions.   ? ?In March 2023, she received both blood transfusion and intravenous iron infusion ? ?I have reviewed the past medical history, past surgical history, social history and family history with the patient and they are unchanged from previous note. ? ?ALLERGIES:  is allergic to sulfamethoxazole, nsaids, and sulfa antibiotics. ? ?MEDICATIONS:  ?Current Outpatient Medications  ?Medication Sig Dispense Refill  ? amLODipine (NORVASC) 5 MG tablet TAKE 1 TABLET (5 MG TOTAL) BY MOUTH DAILY. 90 tablet 3  ? Calcium Citrate-Vitamin D (CITRACAL + D PO) Take 1 tablet by mouth daily with lunch. 800 mg calcium    ? Cholecalciferol (VITAMIN D3 PO) Take 1,000 Units by mouth daily.    ? denosumab (PROLIA) 60 MG/ML SOSY injection Inject 60 mg into the skin every 6 (six) months.    ? docusate sodium (COLACE) 250 MG capsule Take 250 mg by mouth daily.    ? Magnesium Glycinate 665 MG CAPS Take 665 mg by mouth daily.    ? Menatetrenone (VITAMIN K2) 100 MCG TABS Take 800 mcg by mouth daily.    ? metoprolol succinate (TOPROL-XL) 25 MG 24 hr tablet Take 0.5 tablets (12.5 mg total) by mouth at bedtime. 45 tablet 3  ? vitamin C (ASCORBIC ACID) 500 MG tablet Take 500 mg by mouth daily.    ? Wheat Dextrin (BENEFIBER DRINK MIX PO) Take 1 Scoop by mouth daily at 2 PM.    ? ?No current facility-administered medications for this visit.  ?  ? ?  REVIEW OF SYSTEMS:   ?Constitutional: Denies fevers, chills or night sweats ?Eyes: Denies blurriness of vision ?Ears, nose, mouth, throat, and face: Denies mucositis or sore throat ?Respiratory: Denies cough, dyspnea or wheezes ?Cardiovascular: Denies palpitation, chest discomfort or lower extremity swelling ?Gastrointestinal:  Denies nausea, heartburn or change in bowel habits ?Skin: Denies abnormal skin rashes ?Lymphatics: Denies new lymphadenopathy or easy bruising ?Neurological:Denies numbness, tingling or new  weaknesses ?Behavioral/Psych: Mood is stable, no new changes  ?All other systems were reviewed with the patient and are negative. ? ?PHYSICAL EXAMINATION: ?ECOG PERFORMANCE STATUS: 1 - Symptomatic but completely ambulatory ? ?Vit

## 2021-07-16 NOTE — Patient Instructions (Signed)

## 2021-07-16 NOTE — Assessment & Plan Note (Signed)
She has chronic intermittent leukopenia This has been present since 2007, unknown etiology Recent repeat B12 level in July was adequate She is not symptomatic Observe for now 

## 2021-07-16 NOTE — Assessment & Plan Note (Signed)
She has multifactorial anemia, secondary to chronic GI bleed causing iron deficiency as well as slight inappropriate erythropoiesis ?She does not need blood transfusion today ?Despite 3 doses of intravenous iron sucrose, while she has improvement of her hemoglobin, it is not normal ?She continues to have symptomatic GI bleed on a regular basis ?I will schedule another dose of IV iron sucrose today and additional 2 more doses in the next 2 weeks ?I will see her again in mid April for evaluation ?

## 2021-07-20 ENCOUNTER — Encounter: Payer: Self-pay | Admitting: Nurse Practitioner

## 2021-07-20 ENCOUNTER — Ambulatory Visit (INDEPENDENT_AMBULATORY_CARE_PROVIDER_SITE_OTHER): Payer: Medicare Other | Admitting: Nurse Practitioner

## 2021-07-20 VITALS — BP 128/66 | HR 58 | Ht 66.0 in | Wt 160.4 lb

## 2021-07-20 DIAGNOSIS — K648 Other hemorrhoids: Secondary | ICD-10-CM

## 2021-07-20 DIAGNOSIS — D509 Iron deficiency anemia, unspecified: Secondary | ICD-10-CM

## 2021-07-20 NOTE — Patient Instructions (Signed)
We have sent the following medications to your pharmacy for you to pick up at your convenience: ?Hydrocortisone Suppositories  ? ? ? ?CAPSULE ENDOSCOPY ?PATIENT INSTRUCTION SHEET ? ?TEVIS CONGER ?06-21-1940 ?[id number] ? ? ?07/19/21 Seven (7) days prior to capsule endoscopy stop taking iron supplements and carafate. ? ?07/24/21 Two (2) days prior to capsule endoscopy stop taking aspirin or any arthritis drugs. ? ?07/25/21 Day before capsule endoscopy purchase a 238 gram bottle of Miralax from the laxative section of your drug store, and a 32 oz. bottle of Gatorade (no red).   ? ?07/25/21 One (1) day prior to capsule endoscopy: ?Stop smoking. ?Eat a regular diet until 12:00 Noon. ?After 12:00 Noon take only the following: ?Black coffee  Jell-O (no fruit or red Jell-o) ?Water   Bouillon (chicken or beef) ?7-Up   Cranberry Juice ?Tea   Kool-Aid Popsicle (not red) ?Sprite   Coke ?Ginger Ale  Pepsi ?Mountain Dew Gatorade ?At 6:00 pm the evening before your appointment, drink 7 capfuls (105 grams) of Miralax with 32 oz. Gatorade. Drink 8 oz every 15 minutes until gone. ?Nothing to eat or drink after midnight except medications with a sip of water. ? ?07/26/21 Day of capsule endoscopy: ? ?No medications for 2 hours prior to your test. ? ?Please arrive at [facility]  3rd floor patient registration area by 8:30am on: 07/26/21.  ? ?For any questions: ?Call [employer] at [phone] and ask to speak with one of the capsule endoscopy nurses. ? ?YOU WILL NEED TO RETURN THE EQUIPMENT AT 4 PM ON THE DAY OF THE PROCEDURE.  PLEASE KEEP THIS IN MIND WHEN SCHEDULING.  ? ?The above instructions have been reviewed and explained to me by________________  ? ?Patient signature:_________________________________________     Date:________________ ? ?Small Bowel Capsule Endoscopy ? ?What you should know: ?Small Bowel capsule endoscopy is a procedure that takes pictures of the inside of your small intestine (bowel).  Your small bowel connects  to your stomach on one end, and your large bowel (colon) on the other.  A capsule endoscopy is done by swallowing a pill size camera.  The capsule moves through your stomach and into your small bowel, where pictures are taken.  ? ?You may need a small bowel capsule endoscopy if you have symptoms, such as blood in your stool, chronic stomach pain, and diarrhea.  The pictures may show if you have growths, swelling, and bleeding area in you small bowel.  A capsule endoscopy may also show if diseases such as Crohn's or celiac disease are causing your symptoms.  Having a small bowel capsule endoscopy may help you and your caregiver learn the cause of your symptoms.  Learning what is causing your symptoms allows you to receive needed treatment and prevent further problems. ?Risks: ?You may have stomach pain during your procedure.   ?The pictures taken by the capsule may not be clear.   ?The pictures may not show the cause of your symptoms.   ?You may need another endoscopy procedure.  ? The capsule may get trapped in your esophagus or intestines. You may need surgery or additional procedures to remove the capsule from your body. ? ? ? ?Before your procedure: ?You will be instructed to stop certain prescription medications or over- the -counter medications prior to the procedure.   ?The day before your scheduled appointment you will need to be on a restricted diet and will need to drink a bowel prep that will clean out your bowels.   ?  The day of the procedure: ?You may drive yourself to the procedure.   ?You will need to plan on 2 trips to the office on the day of the procedure. ?Morning: ?Plan to be at the office about 45 minutes. ?The morning of the procedure a sensor belt and recorder will be placed on you.  You will wear this for 8 hours.  (The sensor belt transfers pictures of your small bowel to the recorder.)   You will be given a pill-sized capsule endoscope to swallow.  Once you swallow the capsule it will travel  through your body the same way food does, constantly taking pictures along the way.  The capsule takes 2-3 pictures a second.   ?Once you have left the office you may go about your normal day with a few exceptions: You may not go near a MRI machine or a radio or television towers; You need to avoid other patients having capsule endoscopy; You will be given a written diet to follow for the day. ? ?Afternoon: ?You will need to be return to the office at your designated time. ?The sensors belt will be removed ?You will need to be at the office about 15 minutes. ? ?Thank you for choosing me and [facility]. ? ?Tye Savoy NP  ? ? ?

## 2021-07-20 NOTE — Progress Notes (Signed)
? ? ?ASSESSMENT:   ? ?Patient Profile:  ?Belinda Harper is a 81 y.o. female known to Dr. Silverio Decamp with a past medical history of internal hemorrhoids s/p banding, chronic iron deficiency anemia, HTN, osteoporosis.  Additional medical history as listed in [place] .  ? ?#Acute on chronic anemia -  anemia dates back years. Multiple prior upper and lower endoscopies. No significant findings on last EGD / colonoscopy Aug 2022. The last small bowel video capsule in 2010 was normal except for pinpoint duodenal erosions. Small bowel biopsies negative for celiac.  Followed by Hematology. Anemia felt to be combination of chronic GI blood loss from hemorrhoids and slight inappropriate erythropoiesis. Recent acute worsening of hgb to 6.3. She receives IV iron and also required PRBCs in January and March of this year. Since undergoing hemorrhoidal banding the rectal bleeding has improved but not resolved. Degree of anemia seems out of proportion to hemorrhoidal bleeding. Need to evaluate for occult GI blood loss from small bowel.  ? ?# Internal hemorrhoids s/p banding ( last session was Feb 2023). Though bleeding overall improved it has not resolved.  ? ? ?PLAN:  ?Repeat course of hydrocortisone suppositories. Begin using the next time rectal bleeding occurs. Use for 3-5 days ( until bleeding stops). If has recurrent bleeding then restart suppositories and use for 5-7 days but can use for up to 10 days if needed.  ?Small bowel video capsule for further evaluation of recent worsening of chronic IDA. f small bowel video capsule study is negative and rectal bleeding persists will likely refer to surgery for definitive hemorrhoid treatment ? ?History of Present Illness  ? ?Chief Complaint : ongoing hemorrhoid bleeding ? ?Clorene gas been undergoing hemorroidal banding ( last time was Feb 2023). We have also evaluated her for chronic iron deficiency anemia and she is followed by Hematology Over the years she had had an extensive workup for  anemia. More recent evaluation by [place] in Aug 2023 included an EGD and colonoscopy, both unrevealing. Recent decline in hgb led to blood transfusion in January and another unit earlier this month. Last saw Dr. Alvy Bimler on 3/23.   ? ?The rectal bleeding is intermittent and painless. Overall amount sounds moderate when she bleeds. Her stools are soft. She doesn't strain. Taking 2 scoops of Benefiber and two colace daily.  ? ? ?Laboratory data: ? ?  Latest Ref Rng & Units 12/09/2020  ?  2:43 PM 12/06/2019  ? 11:47 AM 11/21/2018  ? 12:15 PM  ?Hepatic Function  ?Total Protein 6.0 - 8.3 g/dL 7.8   7.1   7.6    ?Albumin 3.5 - 5.2 g/dL 4.4    4.4    ?AST 0 - 37 U/L _0 ?ALT 0 - 35 U/L _1 ?Alk Phosphatase 39 - 117 U/L 79    86    ?Total Bilirubin 0.2 - 1.2 mg/dL 0.3   0.4   0.4    ?Bilirubin, Direct 0.0 - 0.3 mg/dL 0.1   0.1   0.1    ? ? ? ?  Latest Ref Rng & Units 07/16/2021  ?  7:55 AM 06/25/2021  ?  8:14 AM 06/10/2021  ? 11:42 AM  ?CBC  ?WBC 4.0 - 10.5 K/uL 3.1   3.2   3.1    ?Hemoglobin 12.0 - 15.0 g/dL 10.7   7.3   9.9    ?Hematocrit 36.0 - 46.0 %  37.0   25.3   33.0    ?Platelets 150 - 400 K/uL 194   239   281    ? ? ?Previous GI Evaluations  ? ?Endoscopies: ( most recent) ? ?Aug 2023 EGD and colonoscopy for IDA ?EGD ?-LA Grade B reflux esophagitis with no bleeding. 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 ?- Gastritis. Biopsied. ?- Normal examined duodenum ?Surgical [P], gastric antrum and gastric body ?- GASTRIC ANTRAL AND OXYNTIC MUCOSA WITH MILD CHRONIC GASTRITIS ?- WARTHIN STARRY STAIN IS NEGATIVE FOR HELICOBACTER PYLORI ? ?Colon ?-Severe diverticulosis in the sigmoid colon, in the descending colon, in the transverse colonand in the ascending colon. ?- Non-bleeding external and internal hemorrhoids. ?- No specimens collect ? ? ?Past Medical History:  ?Diagnosis Date  ? 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 ALLERGIC  RHINITIS 01/03/2007  ? ANEMIA-IRON DEFICIENCY 01/03/2007  ? Anxiety   ? ASTHMA 01/03/2007  ? Blood transfusion 12 2010  ? hg 4 transfused 4 units  ? DEPRESSION 01/03/2007  ? resolved  ? Family history of adverse reaction to anesthesia   ? pt's mother as she gets older organs shut down   ? FRACTURE, ANKLE, LEFT 04/10/2009  ? Gastritis 2002  ? GERD (gastroesophageal reflux disease)   ? HEMORRHOIDS 05/19/2008  ? HYPERCHOLESTEROLEMIA 05/19/2008  ? Normal cardiac stress test   ? 2014  ? OSTEOARTHRITIS 01/03/2007  ? OSTEOPOROSIS 01/03/2007  ? hx of actonbel use for at least 5 years   ? Sepsis due to cellulitis ([place]) 02/05/2015  ? Unspecified vitamin D deficiency 04/06/2007  ? UTERINE PROLAPSE 04/06/2007  ? ? ?Past Surgical History:  ?Procedure Laterality Date  ? ANKLE FRACTURE SURGERY  02/2009  ? left ankle fracture and dislocation  ? CATARACT EXTRACTION Left   ? last year   ? CATARACT EXTRACTION W/ INTRAOCULAR LENS IMPLANT  2000  ? right eye  ? HARDWARE REMOVAL Left 02/21/2015  ? Procedure: IRRIGATION AND DEBRIDEMENT HARDWARE REMOVAL LEFT ANKLE ;  Surgeon: Gaynelle Arabian, MD;  Location: [facility];  Service: Orthopedics;  Laterality: Left;  ? MASS EXCISION Left 09/11/2012  ? 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 ORIF FINGER / [place]  ? reattached  ? PANENDOSCOPY    ? small bowel bx  ? ?Current Medications, Allergies, Family History and Social History were reviewed in [employer] record. ?  ?  ?Current Outpatient Medications  ?Medication Sig Dispense Refill  ? amLODipine (NORVASC) 5 MG tablet TAKE 1 TABLET (5 MG TOTAL) BY MOUTH DAILY. 90 tablet 3  ? Calcium Citrate-Vitamin D (CITRACAL + D PO) Take 1 tablet by mouth daily with lunch. 800 mg calcium    ? Cholecalciferol (VITAMIN D3 PO) Take 1,000 Units by mouth daily.    ? denosumab (PROLIA) 60  MG/ML SOSY injection Inject 60 mg into the skin every 6 (six) months.    ? docusate sodium (COLACE) 250 MG capsule Take 250 mg by mouth daily.    ? Magnesium Glycinate 665 MG CAPS Take 665 mg by mouth daily.    ? Menatetrenone (VITAMIN K2) 100 MCG TABS Take 800 mcg by mouth daily.    ? metoprolol succinate (TOPROL-XL) 25 MG 24 hr tablet Take 0.5 tablets (12.5 mg total) by mouth at bedtime. 45 tablet 3  ?  vitamin C (ASCORBIC ACID) 500 MG tablet Take 500 mg by mouth daily.    ? Wheat Dextrin (BENEFIBER DRINK MIX PO) Take 2 Scoops by mouth in the morning and at bedtime.    ? ?No current facility-administered medications for this visit.  ? ? ?Review of Systems: ?No chest pain. No shortness of breath. No urinary complaints.  ? ?Physical Exam:   ? ?Wt Readings from Last 3 Encounters:  ?07/20/21 160 lb 6.4 oz (72.8 kg)  ?07/16/21 160 lb 6.4 oz (72.8 kg)  ?07/05/21 158 lb 4 oz (71.8 kg)  ? ? ?BP 128/66   Pulse (!) 58   Ht _0  (1.676 m)   Wt 160 lb 6.4 oz (72.8 kg)   SpO2 97%   BMI 25.89 kg/m?  ?Constitutional:  Generally well appearing female in no acute distress. ?Psychiatric: Pleasant. Normal mood and affect. Behavior is normal. ?EENT: Pupils normal.  Conjunctivae are normal. No scleral icterus. ?Neck supple.  ?Cardiovascular: Normal rate, regular rhythm. No edema ?Pulmonary/chest: Effort normal and breath sounds normal. No wheezing, rales or rhonchi. ?Abdominal: Soft, nondistended, nontender. Bowel sounds active throughout ?Neurological: Alert and oriented to person place and time. ?Skin: Skin is warm and dry. No rashes noted. ? ?Tye Savoy, NP  07/20/2021, 1:30 PM ? ?Cc:  ?Panosh, Standley Brooking, MD ? ? ? ? ? ? ? ?

## 2021-07-22 ENCOUNTER — Other Ambulatory Visit: Payer: Self-pay

## 2021-07-22 ENCOUNTER — Inpatient Hospital Stay: Payer: Medicare Other

## 2021-07-22 VITALS — BP 144/47 | HR 63 | Temp 98.8°F | Resp 18

## 2021-07-22 DIAGNOSIS — D649 Anemia, unspecified: Secondary | ICD-10-CM

## 2021-07-22 DIAGNOSIS — D500 Iron deficiency anemia secondary to blood loss (chronic): Secondary | ICD-10-CM | POA: Diagnosis not present

## 2021-07-22 DIAGNOSIS — D539 Nutritional anemia, unspecified: Secondary | ICD-10-CM

## 2021-07-22 MED ORDER — SODIUM CHLORIDE 0.9 % IV SOLN
400.0000 mg | Freq: Once | INTRAVENOUS | Status: AC
  Administered 2021-07-22: 400 mg via INTRAVENOUS
  Filled 2021-07-22: qty 20

## 2021-07-22 MED ORDER — SODIUM CHLORIDE 0.9 % IV SOLN: Freq: Once | INTRAVENOUS | Status: AC

## 2021-07-22 NOTE — Progress Notes (Signed)
Reviewed and agree with documentation and assessment and plan. K. Veena Henri Guedes , MD   

## 2021-07-22 NOTE — Progress Notes (Signed)
Patient refused 30 minute post observation period for her iron infusion. Vss upon discharge.  ? ?

## 2021-07-26 ENCOUNTER — Encounter: Payer: Self-pay | Admitting: Gastroenterology

## 2021-07-26 ENCOUNTER — Ambulatory Visit (INDEPENDENT_AMBULATORY_CARE_PROVIDER_SITE_OTHER): Payer: Medicare Other | Admitting: Gastroenterology

## 2021-07-26 DIAGNOSIS — D539 Nutritional anemia, unspecified: Secondary | ICD-10-CM | POA: Diagnosis not present

## 2021-07-26 DIAGNOSIS — D500 Iron deficiency anemia secondary to blood loss (chronic): Secondary | ICD-10-CM

## 2021-07-26 DIAGNOSIS — R195 Other fecal abnormalities: Secondary | ICD-10-CM | POA: Diagnosis not present

## 2021-07-26 DIAGNOSIS — D649 Anemia, unspecified: Secondary | ICD-10-CM | POA: Diagnosis not present

## 2021-07-26 NOTE — Patient Instructions (Signed)
You may have a clear liquid diet at 10:30 am  You may have a light lunch beginning at 12:30 ( ex 1/2 sandwich and a bowl of soup)  You may resume your normal diet at 5:00 pm  If you experience any abdominal pain, nausea ,or vomiting after ingesting the capsule please call [phone] to notify the nurse  You may not have an MRI until you have confirmation that you have passed the capsule.   Please return to the office by 4 PM today to return equipment.   

## 2021-07-26 NOTE — Progress Notes (Signed)
Capsule ID: [id number] ?Exp: 2022-08-30 ?LOT: [id number] ? ?Patient arrived for Capsule Endoscopy. Reported the prep went well. This nurse explained dietary restrictions for the next few hours. Patient verbalized understanding. Opened capsule, ensured capsule was flashing prior to the patient swallowing the capsule. Patient swallowed capsule without difficulty. Patient instructed to return to the office at 4:00 pm today for removal of the recording equipment, to call the office with any questions and if no capsule was visualized after 72 hours. No further questions by the conclusion of the visit. ? ?

## 2021-07-27 ENCOUNTER — Encounter: Payer: Self-pay | Admitting: Gastroenterology

## 2021-07-29 ENCOUNTER — Other Ambulatory Visit: Payer: Self-pay

## 2021-07-29 ENCOUNTER — Other Ambulatory Visit: Payer: Self-pay | Admitting: Hematology and Oncology

## 2021-07-29 ENCOUNTER — Inpatient Hospital Stay: Payer: Medicare Other | Attending: Hematology and Oncology

## 2021-07-29 VITALS — BP 149/51 | HR 62 | Temp 98.1°F | Resp 18

## 2021-07-29 DIAGNOSIS — R5383 Other fatigue: Secondary | ICD-10-CM | POA: Insufficient documentation

## 2021-07-29 DIAGNOSIS — K922 Gastrointestinal hemorrhage, unspecified: Secondary | ICD-10-CM | POA: Diagnosis present

## 2021-07-29 DIAGNOSIS — D539 Nutritional anemia, unspecified: Secondary | ICD-10-CM

## 2021-07-29 DIAGNOSIS — Z79899 Other long term (current) drug therapy: Secondary | ICD-10-CM | POA: Diagnosis not present

## 2021-07-29 DIAGNOSIS — D500 Iron deficiency anemia secondary to blood loss (chronic): Secondary | ICD-10-CM | POA: Diagnosis not present

## 2021-07-29 DIAGNOSIS — D72819 Decreased white blood cell count, unspecified: Secondary | ICD-10-CM | POA: Diagnosis not present

## 2021-07-29 DIAGNOSIS — K648 Other hemorrhoids: Secondary | ICD-10-CM | POA: Insufficient documentation

## 2021-07-29 DIAGNOSIS — D649 Anemia, unspecified: Secondary | ICD-10-CM

## 2021-07-29 MED ORDER — SODIUM CHLORIDE 0.9 % IV SOLN
400.0000 mg | Freq: Once | INTRAVENOUS | Status: AC
  Administered 2021-07-29: 400 mg via INTRAVENOUS
  Filled 2021-07-29: qty 20

## 2021-07-29 MED ORDER — SODIUM CHLORIDE 0.9 % IV SOLN: Freq: Once | INTRAVENOUS | Status: AC

## 2021-07-29 NOTE — Patient Instructions (Signed)

## 2021-07-29 NOTE — Progress Notes (Signed)
Pt declined to stay during 30 minute observation period, VSS upon discharge. ?

## 2021-08-02 ENCOUNTER — Telehealth: Payer: Self-pay

## 2021-08-02 NOTE — Telephone Encounter (Signed)
Called the patient with her capsule endoscopy summary.  ?See the scanned document for study details. ? ?Complete Capsule study, essentially negative with no findings to explain iron deficiency anemia. ?Continue IV Fe infusions as indicated. ?Avoid ASA and NSAIDS ?Serial Hgb's every 4 to 6 weeks and follow Iron studies every 3 to 4 months ? ?Patient wants referral to surgeon for her hemorrhoid. ?Does she need to keep the appointment 09/07/21 with Dr Silverio Decamp (and if yes, asks why?) ?

## 2021-08-09 ENCOUNTER — Inpatient Hospital Stay (HOSPITAL_BASED_OUTPATIENT_CLINIC_OR_DEPARTMENT_OTHER): Payer: Medicare Other | Admitting: Hematology and Oncology

## 2021-08-09 ENCOUNTER — Other Ambulatory Visit: Payer: Self-pay

## 2021-08-09 ENCOUNTER — Inpatient Hospital Stay: Payer: Medicare Other

## 2021-08-09 VITALS — BP 139/69 | HR 64 | Temp 97.8°F | Resp 18

## 2021-08-09 DIAGNOSIS — D509 Iron deficiency anemia, unspecified: Secondary | ICD-10-CM

## 2021-08-09 DIAGNOSIS — D539 Nutritional anemia, unspecified: Secondary | ICD-10-CM

## 2021-08-09 DIAGNOSIS — D72819 Decreased white blood cell count, unspecified: Secondary | ICD-10-CM | POA: Diagnosis not present

## 2021-08-09 DIAGNOSIS — D649 Anemia, unspecified: Secondary | ICD-10-CM

## 2021-08-09 DIAGNOSIS — K649 Unspecified hemorrhoids: Secondary | ICD-10-CM

## 2021-08-09 DIAGNOSIS — D500 Iron deficiency anemia secondary to blood loss (chronic): Secondary | ICD-10-CM

## 2021-08-09 LAB — CBC WITH DIFFERENTIAL/PLATELET
Abs Immature Granulocytes: 0 10*3/uL (ref 0.00–0.07)
Basophils Absolute: 0.1 10*3/uL (ref 0.0–0.1)
Basophils Relative: 2 %
Eosinophils Absolute: 0.3 10*3/uL (ref 0.0–0.5)
Eosinophils Relative: 9 %
HCT: 36.4 % (ref 36.0–46.0)
Hemoglobin: 10.7 g/dL — ABNORMAL LOW (ref 12.0–15.0)
Immature Granulocytes: 0 %
Lymphocytes Relative: 35 %
Lymphs Abs: 1.1 10*3/uL (ref 0.7–4.0)
MCH: 26.6 pg (ref 26.0–34.0)
MCHC: 29.4 g/dL — ABNORMAL LOW (ref 30.0–36.0)
MCV: 90.5 fL (ref 80.0–100.0)
Monocytes Absolute: 0.3 10*3/uL (ref 0.1–1.0)
Monocytes Relative: 8 %
Neutro Abs: 1.4 10*3/uL — ABNORMAL LOW (ref 1.7–7.7)
Neutrophils Relative %: 46 %
Platelets: 166 10*3/uL (ref 150–400)
RBC: 4.02 MIL/uL (ref 3.87–5.11)
RDW: 21.7 % — ABNORMAL HIGH (ref 11.5–15.5)
WBC: 3.1 10*3/uL — ABNORMAL LOW (ref 4.0–10.5)
nRBC: 0 % (ref 0.0–0.2)

## 2021-08-09 LAB — SAMPLE TO BLOOD BANK

## 2021-08-09 LAB — IRON AND IRON BINDING CAPACITY (CC-WL,HP ONLY)
Iron: 89 ug/dL (ref 28–170)
Saturation Ratios: 34 % — ABNORMAL HIGH (ref 10.4–31.8)
TIBC: 263 ug/dL (ref 250–450)
UIBC: 174 ug/dL (ref 148–442)

## 2021-08-09 LAB — FERRITIN: Ferritin: 1826 ng/mL — ABNORMAL HIGH (ref 11–307)

## 2021-08-09 MED ORDER — SODIUM CHLORIDE 0.9 % IV SOLN: Freq: Once | INTRAVENOUS | Status: AC

## 2021-08-09 MED ORDER — SODIUM CHLORIDE 0.9 % IV SOLN
400.0000 mg | Freq: Once | INTRAVENOUS | Status: AC
  Administered 2021-08-09: 400 mg via INTRAVENOUS
  Filled 2021-08-09: qty 20

## 2021-08-09 NOTE — Telephone Encounter (Signed)
Agree with referral to surgery for management of persistent bleeding from hemorrhoids leading to iron deficiency anemia.  Please send referral to colorectal surgeon Dr. Marcello Moores or Dr. Dema Severin.  Thanks ?

## 2021-08-09 NOTE — Patient Instructions (Signed)

## 2021-08-09 NOTE — Telephone Encounter (Signed)
Records, demographics and a copy of the insurance card faxed to [place]. Called the patient. No answer. Left her a message advising the referral has been started. She will receive a call directly from the surgeon's office to schedule the appointment. Also, her labs are in. She will receive Dr Woodward Ku impression and recommendations once she has reviewed them. ?

## 2021-08-10 ENCOUNTER — Encounter: Payer: Self-pay | Admitting: Hematology and Oncology

## 2021-08-10 ENCOUNTER — Telehealth: Payer: Self-pay

## 2021-08-10 NOTE — Assessment & Plan Note (Signed)
She has severe hemorrhoidal bleeding on a regular basis ?Her prior colonoscopy revealed evidence of hemorrhoid ?Due to her significant need for IV iron and blood transfusion on and off, I think it is prudent to consider referring to colorectal surgeon for evaluation and management ?

## 2021-08-10 NOTE — Assessment & Plan Note (Signed)
She has chronic intermittent leukopenia This has been present since 2007, unknown etiology Recent repeat B12 level in July was adequate She is not symptomatic Observe for now 

## 2021-08-10 NOTE — Assessment & Plan Note (Signed)
She has multifactorial anemia, secondary to chronic GI bleed causing iron deficiency as well as slight inappropriately low erythropoiesis ?She does not need blood transfusion today ?Despite 3 doses of intravenous iron sucrose, while she has improvement of her hemoglobin, it is not normal ?At the time of dictation, ferritin level came back adequate ?It appears that the best/highest level of hemoglobin is at 10.7 ?She continues to have symptomatic GI bleed on a regular basis ?I will schedule another dose of IV iron sucrose today  ?I will see her again in mid May for evaluation ?

## 2021-08-10 NOTE — Telephone Encounter (Signed)
Faxed referral to [facility] at [phone], received fax confirmation. ?

## 2021-08-10 NOTE — Progress Notes (Signed)
? ?Belinda Harper ?OFFICE PROGRESS NOTE ? ?Panosh, Standley Brooking, MD ? ?ASSESSMENT & PLAN:  ?Symptomatic anemia ?She has multifactorial anemia, secondary to chronic GI bleed causing iron deficiency as well as slight inappropriately low erythropoiesis ?She does not need blood transfusion today ?Despite 3 doses of intravenous iron sucrose, while she has improvement of her hemoglobin, it is not normal ?At the time of dictation, ferritin level came back adequate ?It appears that the best/highest level of hemoglobin is at 10.7 ?She continues to have symptomatic GI bleed on a regular basis ?I will schedule another dose of IV iron sucrose today  ?I will see her again in mid May for evaluation ? ?Leukopenia ?She has chronic intermittent leukopenia ?This has been present since 2007, unknown etiology ?Recent repeat B12 level in July was adequate ?She is not symptomatic ?Observe for now ? ?Hemorrhoids ?She has severe hemorrhoidal bleeding on a regular basis ?Her prior colonoscopy revealed evidence of hemorrhoid ?Due to her significant need for IV iron and blood transfusion on and off, I think it is prudent to consider referring to colorectal surgeon for evaluation and management ? ?Orders Placed This Encounter  ?Procedures  ? Ambulatory referral to General Surgery  ?  Referral Priority:   Routine  ?  Referral Type:   Surgical  ?  Referral Reason:   Specialty Services Required  ?  Referred to Provider:   Clovis Riley, MD  ?  Requested Specialty:   General Surgery  ?  Number of Visits Requested:   1  ? ? ?The total time spent in the appointment was 30 minutes encounter with patients including review of chart and various tests results, discussions about plan of care and coordination of care plan ? ? All questions were answered. The patient knows to call the clinic with any problems, questions or concerns. No barriers to learning was detected. ? ? ? [name], MD ?4/18/20237:54 AM ? ?INTERVAL HISTORY: ?Belinda Harper 81  y.o. female returns for follow-up on chronic leukopenia and severe iron deficiency anemia due to chronic GI bleed ?She has completed capsule endoscopy recently and was told it was unrevealing ?She continues to have daily hemorrhoidal bleeding ?She complains of fatigue ? ?SUMMARY OF HEMATOLOGIC HISTORY: ? 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 Summary of her history is as follows: ?This is a pleasant woman with multifactorial anemia and associated leukopenia. 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 The chronic anemia and leukopenia go back as far as we have records (2007) when white counts ran as low as 2700. She has a normal differential. White count average is 3000 and has not changed. Platelet count is normal. Best hemoglobin she achieves with parenteral iron replacement is 11 g. She had a nondiagnostic bone marrow biopsy done in 2006. ?She had received intravenous iron infusion in the past. Her last colonoscopy in May 2013 showed severe diverticulosis with internal hemorrhoids. ?She feels well. ?Denies recent infection. ?She denies symptoms of fatigue. ?She tolerated iron supplement well. ?She had periodic hemorrhoidal bleeding. ?She was found to have abnormal CBC from intermittently over the years by her primary care doctor ?She has started taking oral iron supplements since November of last year ?Most recently, she have noted passage  of dark stool ?She attempted to increase her oral iron supplement but still complain of excessive fatigue ?Recently, her repeat CBC has dropped from 11-8.2 and hence she was referred back here for further evaluation ?On 12/15/20, she had repeat EGD and colonoscopy which showed: ?- Severe diverticulosis in the  sigmoid colon, in the descending colon, in the transverse colon and in the ascending colon. ?- Non-bleeding external and internal hemorrhoids. ?- No specimens collected. ? ?She denies recent chest pain on exertion, shortness of breath on minimal exertion, pre-syncopal episodes, or palpitations. ? ?The patient denies over the counter NSAID ingestion. She is not on antiplatelets agents. ?Her last colonoscopy was in 2013 ?She had no prior history or diagnosis of cancer. Her age appropriate screening programs are up-to-date. ?She denies any pica and eats a variety of diet. ? ?The patient was prescribed oral iron supplements and she takes 1 dose of oral iron supplement at night to avoid constipation ?From Sept 2021 till present, she has received many doses of intravenous iron infusion ?On December 15, 2020, she had repeat upper endoscopy evaluation due to severe anemia which showed ?LA Grade B reflux esophagitis with no bleeding. 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 ?- Gastritis. Biopsied. ?- Normal examined duodenum. ? ?Since Monoferric IV iron in October 2022, her iron deficiency resolved ?On May 28, 2021, she started on Aranesp injection ? ?On 06/18/21, she had repeat GI procedure ?PROCEDURE NOTE: ?The patient presents with symptomatic grade II-III  hemorrhoids, requesting rubber band ligation of his/her hemorrhoidal disease.  All risks, benefits and alternative forms of therapy were described and informed consent was obtained. ?  ?In the Left Lateral Decubitus position anoscopic examination revealed grade II-III hemorrhoids in the left lateral and right posterior position(s).  ?The anorectum was pre-medicated with 0.125% Nitroglycerine and Recticare ?The decision was made to band the left lateral internal hemorrhoid, and the Raymond O?Regan System was used to perform band ligation without complication.  ?Digital anorectal examination was then performed to  assure proper positioning of the band, and to adjust the banded tissue as required. ?The patient was discharged home without pain or other issues.  Dietary and behavioral recommendations were given and along with follow-up instructions.   ? ?In March 2023, she received both blood transfusion and intravenous iron infusion ? ? ?I have reviewed the past medical history, past surgical history, social history and family history with the patient and they are unchanged from previous note. ? ?ALLERGIES:  is allergic to sulfamethoxazole, nsaids, and sulfa antibiotics. ? ?MEDICATIONS:  ?Current Outpatient Medications  ?Medication Sig Dispense Refill  ? amLODipine (NORVASC) 5 MG tablet TAKE 1 TABLET (5 MG TOTAL) BY MOUTH DAILY. 90 tablet 3  ? Calcium Citrate-Vitamin D (CITRACAL + D PO) Take 1 tablet by mouth daily with lunch. 800 mg calcium    ? Cholecalciferol (VITAMIN D3 PO) Take 1,000 Units by mouth daily.    ? denosumab (PROLIA) 60 MG/ML SOSY injection Inject 60 mg into the skin every 6 (six) months.    ? docusate sodium (COLACE) 250 MG capsule Take 250 mg by mouth daily.    ? hydrocortisone (ANUSOL-HC) 25 MG suppository Place 25 mg rectally at bedtime.    ? Magnesium Glycinate 665 MG CAPS Take 665 mg by mouth daily.    ? Menatetrenone (VITAMIN K2) 100 MCG TABS Take 800 mcg by mouth daily.    ? metoprolol succinate (TOPROL-XL) 25 MG 24 hr tablet Take 0.5  tablets (12.5 mg total) by mouth at bedtime. 45 tablet 3  ? vitamin C (ASCORBIC ACID) 500 MG tablet Take 500 mg by mouth daily.    ? Wheat Dextrin (BENEFIBER DRINK MIX PO) Take 2 Scoops by mouth in the morning and at bedtime.    ? ?No current facility-administered medications for this visit.  ?  ? ?REVIEW OF SYSTEMS:   ?Constitutional: Denies fevers, chills or night sweats ?Eyes: Denies blurriness of vision ?Ears, nose, mouth, throat, and face: Denies mucositis or sore throat ?Respiratory: Denies cough, dyspnea or wheezes ?Cardiovascular: Denies palpitation, chest  discomfort or lower extremity swelling ?Gastrointestinal:  Denies nausea, heartburn or change in bowel habits ?Skin: Denies abnormal skin rashes ?Lymphatics: Denies new lymphadenopathy or easy bruising ?Neurolo

## 2021-08-12 ENCOUNTER — Other Ambulatory Visit: Payer: Self-pay | Admitting: Hematology and Oncology

## 2021-08-16 ENCOUNTER — Inpatient Hospital Stay: Payer: Medicare Other

## 2021-08-23 ENCOUNTER — Inpatient Hospital Stay: Payer: Medicare Other

## 2021-08-26 ENCOUNTER — Telehealth: Payer: Self-pay | Admitting: Gastroenterology

## 2021-08-26 NOTE — Telephone Encounter (Signed)
Dr Silverio Decamp, Do you need this patient to keep her 5/16 appointment  ?

## 2021-08-26 NOTE — Telephone Encounter (Signed)
Patient called asking if she should keep the 5/16 appt with Dr. Silverio Decamp since she came in for an earlier visit.  Please call patient and advise.  Thank you. ?

## 2021-08-27 NOTE — Telephone Encounter (Signed)
Please reschedule to after she see surgery for follow up . Thanks ?

## 2021-08-27 NOTE — Telephone Encounter (Signed)
Left message for patient to return my call.

## 2021-08-27 NOTE — Telephone Encounter (Signed)
Patient is returning your call.  

## 2021-08-30 NOTE — Telephone Encounter (Signed)
Spoke with patient and she wanted to just cancel and call back to reschedule ?

## 2021-08-31 ENCOUNTER — Ambulatory Visit: Payer: Self-pay | Admitting: General Surgery

## 2021-08-31 NOTE — H&P (Signed)
? ?REFERRING PHYSICIAN:  Ree Shay, MD ? ?PROVIDER:  Monico Blitz, MD ? ?MRN: [id number] ?DOB: 1940/11/01 ?DATE OF ENCOUNTER: 08/31/2021 ? ?Subjective  ? ?Chief Complaint: No chief complaint on file. ?  ? ? ?History of Present Illness: ?Belinda Harper is a 81 y.o. female who is seen today as an office consultation at the request of Dr. Sheppard Penton for evaluation of No chief complaint on file. ?.  81 year old female with anemia and chronic lower GI bleeding for many years.   Recent colonoscopy showed diverticula and nonbleeding external and large, ulcerated internal hemorrhoids.  Capsule endoscopy was relatively unremarkable.  It appears she is received at least 2 transfusions in the last few months.  She has noticed a prolapsing hemorrhoid for years.  She reduces this after bowel movements.  She reports some bleeding noted every other week, but this is rather minimal. ? ? ?Review of Systems: ?A complete review of systems was obtained from the patient.  I have reviewed this information and discussed as appropriate with the patient.  See HPI as well for other ROS. ? ? ?Medical History: ?Past Medical History:  ?Diagnosis Date  ? Anemia   ? Anxiety   ? Arthritis   ? Asthma, unspecified asthma severity, unspecified whether complicated, unspecified whether persistent   ? COPD (chronic obstructive pulmonary disease) (CMS-HCC)   ? GERD (gastroesophageal reflux disease)   ? ? ?There is no problem list on file for this patient. ? ? ?Past Surgical History:  ?Procedure Laterality Date  ? CATARACT EXTRACTION    ?  ? ?Allergies  ?Allergen Reactions  ? Nsaids (Non-Steroidal Anti-Inflammatory Drug) Other (See Comments)  ? Sulfamethoxazole Rash  ? ? ?Current Outpatient Medications on File Prior to Visit  ?Medication Sig Dispense Refill  ? amLODIPine (NORVASC) 5 MG tablet Take 5 mg by mouth once daily    ? metoprolol succinate (TOPROL-XL) 25 MG XL tablet Take by mouth    ? ascorbic acid, vitamin C, 500 mg Chew Take by  mouth    ? CALCIUM CITRATE-VITAMIN D2 ORAL Take by mouth    ? cholecalciferol, vitamin D3, (VITAMIN D3) 125 mcg (5,000 unit) tablet Take by mouth    ? denosumab (PROLIA) 60 mg/mL inj syringe Inject subcutaneously    ? docusate (COLACE) 250 MG capsule Take by mouth    ? hydrocortisone (ANUSOL-HC) 25 mg suppository USE 1 SUPPOSITORY RECTALLY ONCE DAILY FOR ONE WEEK    ? MAGNESIUM GLYCINATE ORAL Take by mouth    ? multivit/folic acid/vit K1 (ONE-A-DAY WOMEN'S 50 PLUS ORAL) One A Day    ? wheat dextrin/aspartame (BENEFIBER S/F, WHEAT DEXTRIN, ORAL) Take by mouth    ? ?No current facility-administered medications on file prior to visit.  ? ? ?Family History  ?Problem Relation Age of Onset  ? Coronary Artery Disease (Blocked arteries around heart) Father   ? Skin cancer Father   ? Obesity Son   ? High blood pressure (Hypertension) Son   ?  ? ?Social History  ? ?Tobacco Use  ?Smoking Status Never  ?Smokeless Tobacco Never  ?  ? ?Social History  ? ?Socioeconomic History  ? Marital status: Divorced  ?Tobacco Use  ? Smoking status: Never  ? Smokeless tobacco: Never  ?Substance and Sexual Activity  ? Alcohol use: Never  ? Drug use: Never  ? ? ?Objective:  ? ? ?Vitals:  ? 08/31/21 0902  ?BP: 132/72  ?Pulse: 72  ?Temp: 36.3 ?C (97.4 ?F)  ?SpO2: 97%  ?Weight: 69.7  kg (153 lb 9.6 oz)  ?Height: 167.6 cm ('5\' 6"'$ )  ?  ? ?Exam ?Gen: NAD ?Abd: soft ?CV:RRR ?Lungs: CTA ?Rectal: min external disease ? ? ?Labs, Imaging and Diagnostic Testing: ? ?Procedure: Anoscopy ?Surgeon: Marcello Moores ?After the risks and benefits were explained, written consent was obtained for above procedure.  A medical assistant chaperone was present thoroughout the entire procedure.  ?Anesthesia: none ?Diagnosis: rectal bleeding ?Findings: Grade 2 moderately inflamed internal hemorrhoids in all 3 positions ? ?Assessment and Plan:  ?Diagnoses and all orders for this visit: ? ?Grade II hemorrhoids ? ?  ? ?81 year old with chronic anemia and grade 2 hemorrhoids.  She also  has a lot of anal canal inflammation.  We discussed that we can ligate her internal hemorrhoids and this will resolve bleeding from that source but she could continue to have bleeding from her anal canal inflammation or other sources in her colon higher up.  I believe she understands this and wishes to proceed with surgery, hoping that this would at least eliminate one source.  We discussed the risk of surgery which include bleeding, infection, urinary retention and other complications related to anesthesia.  All questions were answered.  Patient would like to proceed. ? ?No follow-ups on file. ? ?  ? ?Rosario Adie, MD ?Colon and Rectal Surgery ?[facility]  ?

## 2021-09-05 ENCOUNTER — Encounter: Payer: Self-pay | Admitting: Hematology and Oncology

## 2021-09-06 ENCOUNTER — Inpatient Hospital Stay: Payer: Medicare Other | Attending: Hematology and Oncology

## 2021-09-06 ENCOUNTER — Other Ambulatory Visit: Payer: Self-pay

## 2021-09-06 ENCOUNTER — Telehealth: Payer: Self-pay

## 2021-09-06 DIAGNOSIS — K922 Gastrointestinal hemorrhage, unspecified: Secondary | ICD-10-CM | POA: Diagnosis present

## 2021-09-06 DIAGNOSIS — D509 Iron deficiency anemia, unspecified: Secondary | ICD-10-CM

## 2021-09-06 DIAGNOSIS — K648 Other hemorrhoids: Secondary | ICD-10-CM | POA: Insufficient documentation

## 2021-09-06 DIAGNOSIS — D539 Nutritional anemia, unspecified: Secondary | ICD-10-CM

## 2021-09-06 DIAGNOSIS — D500 Iron deficiency anemia secondary to blood loss (chronic): Secondary | ICD-10-CM | POA: Diagnosis present

## 2021-09-06 LAB — CBC WITH DIFFERENTIAL/PLATELET
Abs Immature Granulocytes: 0.01 10*3/uL (ref 0.00–0.07)
Basophils Absolute: 0 10*3/uL (ref 0.0–0.1)
Basophils Relative: 1 %
Eosinophils Absolute: 0.2 10*3/uL (ref 0.0–0.5)
Eosinophils Relative: 7 %
HCT: 32.8 % — ABNORMAL LOW (ref 36.0–46.0)
Hemoglobin: 10.3 g/dL — ABNORMAL LOW (ref 12.0–15.0)
Immature Granulocytes: 0 %
Lymphocytes Relative: 35 %
Lymphs Abs: 1 10*3/uL (ref 0.7–4.0)
MCH: 29.2 pg (ref 26.0–34.0)
MCHC: 31.4 g/dL (ref 30.0–36.0)
MCV: 92.9 fL (ref 80.0–100.0)
Monocytes Absolute: 0.2 10*3/uL (ref 0.1–1.0)
Monocytes Relative: 7 %
Neutro Abs: 1.4 10*3/uL — ABNORMAL LOW (ref 1.7–7.7)
Neutrophils Relative %: 50 %
Platelets: 171 10*3/uL (ref 150–400)
RBC: 3.53 MIL/uL — ABNORMAL LOW (ref 3.87–5.11)
RDW: 21.2 % — ABNORMAL HIGH (ref 11.5–15.5)
WBC: 2.8 10*3/uL — ABNORMAL LOW (ref 4.0–10.5)
nRBC: 0 % (ref 0.0–0.2)

## 2021-09-06 LAB — SAMPLE TO BLOOD BANK

## 2021-09-06 LAB — IRON AND IRON BINDING CAPACITY (CC-WL,HP ONLY)
Iron: 56 ug/dL (ref 28–170)
Saturation Ratios: 21 % (ref 10.4–31.8)
TIBC: 272 ug/dL (ref 250–450)
UIBC: 216 ug/dL (ref 148–442)

## 2021-09-06 LAB — FERRITIN: Ferritin: 953 ng/mL — ABNORMAL HIGH (ref 11–307)

## 2021-09-06 NOTE — Telephone Encounter (Signed)
Called and given below message. Added lab appt at 10 am today and ask her to wait in the lobby after lab appt. The office will give her a copy of lab results. ?

## 2021-09-06 NOTE — Progress Notes (Signed)
Given a copy of CBC and told her that she does not need a blood transfusion per Dr. Alvy Bimler. She verbalized understanding. ?

## 2021-09-06 NOTE — Telephone Encounter (Signed)
-----   Message from [name], MD sent at 09/06/2021  8:23 AM EDT ----- ?PLs add lab appt and remind her not to send mycharrt message when she is not well ?I will make sure all orders are in ? ? ?

## 2021-09-07 ENCOUNTER — Ambulatory Visit (INDEPENDENT_AMBULATORY_CARE_PROVIDER_SITE_OTHER): Payer: Medicare Other | Admitting: Obstetrics & Gynecology

## 2021-09-07 ENCOUNTER — Encounter: Payer: Self-pay | Admitting: Obstetrics & Gynecology

## 2021-09-07 ENCOUNTER — Ambulatory Visit: Payer: Medicare Other | Admitting: Gastroenterology

## 2021-09-07 VITALS — BP 110/76

## 2021-09-07 DIAGNOSIS — Z4689 Encounter for fitting and adjustment of other specified devices: Secondary | ICD-10-CM | POA: Diagnosis not present

## 2021-09-07 NOTE — Progress Notes (Signed)
? ? ?  Belinda Harper January 02, 1941 [id number] ? ? ?     81 y.o.  [id number]  ? ?RP: Pessary maintenance  ?  ?HPI: Doing very well on Milex ring #5 with support. Very mild vaginal discharge, no itching or odor, no bleeding, no vaginal pain.  Urine/BMs normal. ? ? ?OB History  ?Gravida Para Term Preterm AB Living  ?'3 2     1 2  '$ ?SAB IAB Ectopic Multiple Live Births  ?1          ?  ?# Outcome Date GA Lbr Len/2nd Weight Sex Delivery Anes PTL Lv  ?3 SAB           ?2 Para           ?1 Para           ? ? ?Past medical history,surgical history, problem list, medications, allergies, family history and social history were all reviewed and documented in the EPIC chart. ? ? ?Directed ROS with pertinent positives and negatives documented in the history of present illness/assessment and plan. ? ?Exam: ? ?Vitals:  ? 09/07/21 1420  ?BP: 110/76  ? ?General appearance:  Normal ? ?Abdomen: Normal ?  ?Gynecologic exam: Vulva normal.  Pessary removed easily, no abnormal discharge.  Pessary cleaned.  Vaginal exam revealed intact vaginal mucosa.  Pessary inserted in vagina easily, good fit. ?  ?  ?Assessment/Plan:  81 yo woman ?  ?1. Encounter for pessary maintenance ?Doing very well with Milex ring #5 with support.  No complication.  Will f/u in 4-5 months for pessary maintenance. ? ?[name] MD, 2:43 PM 09/07/2021 ? ? ? ?  ?

## 2021-09-09 ENCOUNTER — Telehealth: Payer: Self-pay

## 2021-09-09 NOTE — Telephone Encounter (Signed)
-----   Message from [name], MD sent at 09/09/2021  9:22 AM EDT ----- Her iron studies from a few days ago came back high She would not need IV iron I suggest reschedule all her appt to 2nd week of June, if she agrees let me know and I will put in LOS

## 2021-09-09 NOTE — Telephone Encounter (Signed)
Called and left below message. Ask her to call the office if she has questions.

## 2021-09-09 NOTE — Telephone Encounter (Signed)
Called and given below message. She verbalized understanding. She agrees to delaying appts. She would like to come in the week of 6/19 due to family staying with her the previous week.  She has been researching online. She is asking if Ferritin could be elevated due to taking Prolia injection. She started injections 5 months ago.

## 2021-09-09 NOTE — Telephone Encounter (Signed)
No, it will not I will send LOS to bring her back week of 6/19

## 2021-09-10 ENCOUNTER — Telehealth: Payer: Self-pay | Admitting: Hematology and Oncology

## 2021-09-10 NOTE — Telephone Encounter (Signed)
.  Called patient to schedule appointment per 5/19 inbasket, patient is aware of date and time.   

## 2021-09-13 ENCOUNTER — Inpatient Hospital Stay: Payer: Medicare Other

## 2021-09-13 ENCOUNTER — Inpatient Hospital Stay: Payer: Medicare Other | Admitting: Hematology and Oncology

## 2021-10-11 ENCOUNTER — Inpatient Hospital Stay: Payer: Medicare Other

## 2021-10-11 ENCOUNTER — Other Ambulatory Visit: Payer: Self-pay

## 2021-10-11 ENCOUNTER — Encounter: Payer: Self-pay | Admitting: Hematology and Oncology

## 2021-10-11 ENCOUNTER — Inpatient Hospital Stay: Payer: Medicare Other | Attending: Hematology and Oncology | Admitting: Hematology and Oncology

## 2021-10-11 DIAGNOSIS — D500 Iron deficiency anemia secondary to blood loss (chronic): Secondary | ICD-10-CM

## 2021-10-11 DIAGNOSIS — K648 Other hemorrhoids: Secondary | ICD-10-CM | POA: Insufficient documentation

## 2021-10-11 DIAGNOSIS — D539 Nutritional anemia, unspecified: Secondary | ICD-10-CM

## 2021-10-11 DIAGNOSIS — D509 Iron deficiency anemia, unspecified: Secondary | ICD-10-CM

## 2021-10-11 DIAGNOSIS — K922 Gastrointestinal hemorrhage, unspecified: Secondary | ICD-10-CM | POA: Diagnosis present

## 2021-10-11 DIAGNOSIS — D72819 Decreased white blood cell count, unspecified: Secondary | ICD-10-CM | POA: Diagnosis not present

## 2021-10-11 DIAGNOSIS — D649 Anemia, unspecified: Secondary | ICD-10-CM

## 2021-10-11 LAB — CBC WITH DIFFERENTIAL/PLATELET
Abs Immature Granulocytes: 0.03 10*3/uL (ref 0.00–0.07)
Basophils Absolute: 0.1 10*3/uL (ref 0.0–0.1)
Basophils Relative: 1 %
Eosinophils Absolute: 0.2 10*3/uL (ref 0.0–0.5)
Eosinophils Relative: 6 %
HCT: 36.6 % (ref 36.0–46.0)
Hemoglobin: 11.5 g/dL — ABNORMAL LOW (ref 12.0–15.0)
Immature Granulocytes: 1 %
Lymphocytes Relative: 37 %
Lymphs Abs: 1.4 10*3/uL (ref 0.7–4.0)
MCH: 29.8 pg (ref 26.0–34.0)
MCHC: 31.4 g/dL (ref 30.0–36.0)
MCV: 94.8 fL (ref 80.0–100.0)
Monocytes Absolute: 0.3 10*3/uL (ref 0.1–1.0)
Monocytes Relative: 8 %
Neutro Abs: 1.8 10*3/uL (ref 1.7–7.7)
Neutrophils Relative %: 47 %
Platelets: 180 10*3/uL (ref 150–400)
RBC: 3.86 MIL/uL — ABNORMAL LOW (ref 3.87–5.11)
RDW: 16.9 % — ABNORMAL HIGH (ref 11.5–15.5)
WBC: 3.8 10*3/uL — ABNORMAL LOW (ref 4.0–10.5)
nRBC: 0 % (ref 0.0–0.2)

## 2021-10-11 LAB — FERRITIN: Ferritin: 420 ng/mL — ABNORMAL HIGH (ref 11–307)

## 2021-10-11 LAB — IRON AND IRON BINDING CAPACITY (CC-WL,HP ONLY)
Iron: 41 ug/dL (ref 28–170)
Saturation Ratios: 16 % (ref 10.4–31.8)
TIBC: 262 ug/dL (ref 250–450)
UIBC: 221 ug/dL (ref 148–442)

## 2021-10-11 LAB — SAMPLE TO BLOOD BANK

## 2021-10-11 NOTE — Assessment & Plan Note (Signed)
She has chronic intermittent leukopenia This has been present since 2007, unknown etiology Recent repeat B12 level in July was adequate She is not symptomatic Observe for now 

## 2021-10-11 NOTE — Assessment & Plan Note (Signed)
She has multifactorial anemia, secondary to chronic GI bleed causing iron deficiency as well as slight inappropriately low erythropoiesis Her hemoglobin has improved Iron studies that are pending but preliminary results are great She does not need IV iron today She is scheduled for another GI procedure soon I will see her back in a month for further follow-up

## 2021-10-11 NOTE — Progress Notes (Signed)
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   The total time spent in the appointment was 20 minutes encounter with patients including review of chart and various tests results, discussions about plan of care and coordination of care plan   All questions were answered. The patient knows to call the clinic with any problems, questions or concerns. No barriers to learning was detected.    [name], MD 6/19/202311:43 AM  INTERVAL HISTORY: Belinda Harper 81 y.o. female returns for further evaluation for chronic pancytopenia and severe iron deficiency anemia requiring IV iron and blood Belinda Harper is feeling great Denies chest pain, shortness of breath or dizziness Belinda Harper denies recent excessive hemorrhoidal bleeding although this is still on a regular basis No recent infection  SUMMARY OF HEMATOLOGIC HISTORY:  IAN CAVEY is seen because of recurrent pancytopenia I have not seen her since 2015 Belinda Harper was transferred to my care after her prior physician has left I reviewed the patient's records extensive and collaborated the history with the patient. Summary of her history is as follows: This is a pleasant woman with multifactorial anemia and associated leukopenia. 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 The chronic anemia and leukopenia go back as far as we have records (2007) when white counts ran as low as 2700. Belinda Harper has a normal differential. White count average is 3000 and has not changed. Platelet count is normal. Best hemoglobin Belinda Harper achieves with parenteral iron replacement is 11 g. Belinda Harper had a nondiagnostic bone marrow biopsy done in 2006. Belinda Harper had received intravenous iron infusion in the past. Her last colonoscopy in May 2013 showed severe diverticulosis with internal hemorrhoids. Belinda Harper feels well. Denies recent infection. Belinda Harper denies symptoms of fatigue. Belinda Harper tolerated iron supplement well. Belinda Harper had periodic hemorrhoidal bleeding. Belinda Harper was found to have abnormal CBC from intermittently over the years by her primary care doctor Belinda Harper has started taking oral iron supplements since November of last year Most recently, Belinda Harper have noted passage of dark stool Belinda Harper attempted to increase her oral iron supplement but still complain of excessive fatigue Recently, her repeat CBC has dropped from 11-8.2 and hence Belinda Harper was referred back here for further evaluation On 12/15/20, Belinda Harper had repeat EGD and colonoscopy which showed: - Severe diverticulosis in the sigmoid colon, in the descending colon, in the transverse colon and in the ascending colon. - Non-bleeding external and internal hemorrhoids. - No specimens collected.  Belinda Harper denies recent chest pain on exertion, shortness of breath on minimal exertion, pre-syncopal episodes, or palpitations.  The patient denies over the counter NSAID ingestion. Belinda Harper is not on antiplatelets agents. Her last colonoscopy was in 2013 Belinda Harper had no prior history or diagnosis of cancer. Her age appropriate screening programs are up-to-date. Belinda Harper denies any pica and eats a variety  of diet.  The patient was prescribed oral iron supplements and Belinda Harper takes 1 dose of oral iron supplement at night to avoid constipation From Sept 2021 till  present, Belinda Harper has received many doses of intravenous iron infusion On December 15, 2020, Belinda Harper had repeat upper endoscopy evaluation due to severe anemia which showed LA Grade B reflux esophagitis with no bleeding. 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 - Gastritis. Biopsied. - Normal examined duodenum.  Since Monoferric IV iron in October 2022, her iron deficiency resolved On May 28, 2021, Belinda Harper started on Aranesp injection  On 06/18/21, Belinda Harper had repeat GI procedure PROCEDURE NOTE: The patient presents with symptomatic grade II-III  hemorrhoids, requesting rubber band ligation of his/her hemorrhoidal disease.  All risks, benefits and alternative forms of therapy were described and informed consent was obtained.   In the Left Lateral Decubitus position anoscopic examination revealed grade II-III hemorrhoids in the left lateral and right posterior position(s).  The anorectum was pre-medicated with 0.125% Nitroglycerine and Recticare The decision was made to band the left lateral internal hemorrhoid, and the Shively was used to perform band ligation without complication.  Digital anorectal examination was then performed to assure proper positioning of the band, and to adjust the banded tissue as required. The patient was discharged home without pain or other issues.  Dietary and behavioral recommendations were given and along with follow-up instructions.    In March 2023, Belinda Harper received both blood transfusion and intravenous iron infusion   I have reviewed the past medical history, past surgical history, social history and family history with the patient and they are unchanged from previous note.  ALLERGIES:  is allergic to sulfamethoxazole, nsaids, and sulfa antibiotics.  MEDICATIONS:  Current Outpatient Medications  Medication Sig Dispense Refill   amLODipine (NORVASC) 5 MG tablet TAKE 1 TABLET (5 MG TOTAL) BY MOUTH DAILY.  90 tablet 3   Calcium Citrate-Vitamin D (CITRACAL + D PO) Take 1 tablet by mouth daily with lunch. 800 mg calcium     denosumab (PROLIA) 60 MG/ML SOSY injection Inject 60 mg into the skin every 6 (six) months.     docusate sodium (COLACE) 250 MG capsule Take 250 mg by mouth daily.     hydrocortisone (ANUSOL-HC) 25 MG suppository Place 25 mg rectally as needed.     Magnesium Glycinate 665 MG CAPS Take 665 mg by mouth daily.     Menatetrenone (VITAMIN K2) 100 MCG TABS Take 800 mcg by mouth daily.     metoprolol succinate (TOPROL-XL) 25 MG 24 hr tablet Take 0.5 tablets (12.5 mg total) by mouth at bedtime. 45 tablet 3   vitamin C (ASCORBIC ACID) 500 MG tablet Take 500 mg by mouth daily.     Wheat Dextrin (BENEFIBER DRINK MIX PO) Take 2 Scoops by mouth in the morning and at bedtime.     No current facility-administered medications for this visit.     REVIEW OF SYSTEMS:   Constitutional: Denies fevers, chills or night sweats Eyes: Denies blurriness of vision Ears, nose, mouth, throat, and face: Denies mucositis or sore throat Respiratory: Denies cough, dyspnea or wheezes Cardiovascular: Denies palpitation, chest discomfort or lower extremity swelling Gastrointestinal:  Denies nausea, heartburn or change in bowel habits Skin: Denies abnormal skin rashes Lymphatics: Denies new lymphadenopathy or easy bruising Neurological:Denies numbness, tingling or new weaknesses Behavioral/Psych: Mood is stable, no new changes  All other systems were reviewed with the patient and are negative.  PHYSICAL EXAMINATION: ECOG PERFORMANCE STATUS: 0 - Asymptomatic  Vitals:   10/11/21 0858  BP: (!) 139/45  Pulse: 66  Resp: 16  Temp: 97.6 F (36.4 C)  SpO2: 97%   Filed Weights   10/11/21 0858  Weight: 155 lb (70.3 kg)    GENERAL:alert, no distress and comfortable NEURO: alert & oriented x 3 with fluent speech, no focal motor/sensory deficits  LABORATORY DATA:  I have reviewed the data as listed      Component Value Date/Time   NA 139 05/17/2021 1205   NA 142 05/28/2012 1600   K 3.9 05/17/2021 1205   K 3.7 05/28/2012 1600   CL 103 05/17/2021 1205   CL 103 05/28/2012 1600   CO2 29 05/17/2021 1205   CO2 29 05/28/2012 1600   GLUCOSE 88 05/17/2021 1205   GLUCOSE 99 05/28/2012 1600   BUN 24 (H) 05/17/2021 1205   BUN 19.3 05/28/2012 1600   CREATININE 0.85 05/17/2021 1205   CREATININE 0.88 12/06/2019 1147   CREATININE 0.8 05/28/2012 1600   CALCIUM 9.0 05/17/2021 1205   CALCIUM 9.5 05/28/2012 1600   PROT 7.8 12/09/2020 1443   PROT 7.3 05/28/2012 1600   ALBUMIN 4.4 12/09/2020 1443   ALBUMIN 3.5 05/28/2012 1600   AST 25 12/09/2020 1443   AST 23 05/28/2012 1600   ALT 15 12/09/2020 1443   ALT 13 05/28/2012 1600   ALKPHOS 79 12/09/2020 1443   ALKPHOS 82 05/28/2012 1600   BILITOT 0.3 12/09/2020 1443   BILITOT <0.20 05/28/2012 1600   GFRNONAA >60 05/17/2021 1205   GFRAA >60 04/23/2018 1634    No results found for: "SPEP", "UPEP"  Lab Results  Component Value Date   WBC 3.8 (L) 10/11/2021   NEUTROABS 1.8 10/11/2021   HGB 11.5 (L) 10/11/2021   HCT 36.6 10/11/2021   MCV 94.8 10/11/2021   PLT 180 10/11/2021      Chemistry      Component Value Date/Time   NA 139 05/17/2021 1205   NA 142 05/28/2012 1600   K 3.9 05/17/2021 1205   K 3.7 05/28/2012 1600   CL 103 05/17/2021 1205   CL 103 05/28/2012 1600   CO2 29 05/17/2021 1205   CO2 29 05/28/2012 1600   BUN 24 (H) 05/17/2021 1205   BUN 19.3 05/28/2012 1600   CREATININE 0.85 05/17/2021 1205   CREATININE 0.88 12/06/2019 1147   CREATININE 0.8 05/28/2012 1600      Component Value Date/Time   CALCIUM 9.0 05/17/2021 1205   CALCIUM 9.5 05/28/2012 1600   ALKPHOS 79 12/09/2020 1443   ALKPHOS 82 05/28/2012 1600   AST 25 12/09/2020 1443   AST 23 05/28/2012 1600   ALT 15 12/09/2020 1443   ALT 13 05/28/2012 1600   BILITOT 0.3 12/09/2020 1443   BILITOT <0.20 05/28/2012 1600

## 2021-10-12 ENCOUNTER — Other Ambulatory Visit: Payer: Self-pay | Admitting: Internal Medicine

## 2021-10-12 ENCOUNTER — Encounter (HOSPITAL_BASED_OUTPATIENT_CLINIC_OR_DEPARTMENT_OTHER): Payer: Self-pay | Admitting: General Surgery

## 2021-10-12 NOTE — Progress Notes (Signed)
Spoke w/ via phone for pre-op interview--- pt Lab needs dos---- [employer], ekg            Lab results------ no COVID test -----patient states asymptomatic no test needed Arrive at ------- 0645 on 10-15-2021 NPO after MN NO Solid Food.  Clear liquids from MN until--- 0545 Med rec completed Medications to take morning of surgery -----toprol, colace Diabetic medication ----- n/a Patient instructed no nail polish to be worn day of surgery Patient instructed to bring photo id and insurance card day of surgery Patient aware to have Driver (ride ) / caregiver for 24 hours after surgery -- friend, clark Patient Special Instructions ----- n/a Pre-Op special Istructions ----- n/a Patient verbalized understanding of instructions that were given at this phone interview. Patient denies shortness of breath, chest pain, fever, cough at this phone interview.

## 2021-10-13 ENCOUNTER — Ambulatory Visit (INDEPENDENT_AMBULATORY_CARE_PROVIDER_SITE_OTHER): Payer: Medicare Other | Admitting: *Deleted

## 2021-10-13 DIAGNOSIS — M810 Age-related osteoporosis without current pathological fracture: Secondary | ICD-10-CM

## 2021-10-13 MED ORDER — DENOSUMAB 60 MG/ML ~~LOC~~ SOSY
60.0000 mg | PREFILLED_SYRINGE | Freq: Once | SUBCUTANEOUS | Status: AC
  Administered 2021-10-13: 60 mg via SUBCUTANEOUS

## 2021-10-13 NOTE — Progress Notes (Cosign Needed)
Per orders of Dr. Regis Bill, injection of Prolia given by Westley Hummer. Patient tolerated injection well.

## 2021-10-15 ENCOUNTER — Ambulatory Visit (HOSPITAL_BASED_OUTPATIENT_CLINIC_OR_DEPARTMENT_OTHER)
Admission: RE | Admit: 2021-10-15 | Discharge: 2021-10-15 | Disposition: A | Discharge status: Home / Self Care | Payer: Medicare Other | Source: Ambulatory Visit | Attending: General Surgery | Admitting: General Surgery

## 2021-10-15 ENCOUNTER — Encounter (HOSPITAL_BASED_OUTPATIENT_CLINIC_OR_DEPARTMENT_OTHER)
Admission: RE | Disposition: A | Discharge status: Home / Self Care | Payer: Self-pay | Source: Ambulatory Visit | Attending: General Surgery

## 2021-10-15 ENCOUNTER — Encounter (HOSPITAL_BASED_OUTPATIENT_CLINIC_OR_DEPARTMENT_OTHER): Payer: Self-pay | Admitting: General Surgery

## 2021-10-15 ENCOUNTER — Ambulatory Visit (HOSPITAL_BASED_OUTPATIENT_CLINIC_OR_DEPARTMENT_OTHER): Payer: Medicare Other | Admitting: Anesthesiology

## 2021-10-15 ENCOUNTER — Other Ambulatory Visit: Payer: Self-pay

## 2021-10-15 DIAGNOSIS — M1990 Unspecified osteoarthritis, unspecified site: Secondary | ICD-10-CM | POA: Diagnosis not present

## 2021-10-15 DIAGNOSIS — I10 Essential (primary) hypertension: Secondary | ICD-10-CM | POA: Diagnosis not present

## 2021-10-15 DIAGNOSIS — D649 Anemia, unspecified: Secondary | ICD-10-CM | POA: Diagnosis not present

## 2021-10-15 DIAGNOSIS — K641 Second degree hemorrhoids: Secondary | ICD-10-CM | POA: Diagnosis not present

## 2021-10-15 DIAGNOSIS — K625 Hemorrhage of anus and rectum: Secondary | ICD-10-CM | POA: Insufficient documentation

## 2021-10-15 DIAGNOSIS — Z01818 Encounter for other preprocedural examination: Secondary | ICD-10-CM

## 2021-10-15 DIAGNOSIS — I471 Supraventricular tachycardia: Secondary | ICD-10-CM | POA: Insufficient documentation

## 2021-10-15 HISTORY — DX: Supraventricular tachycardia, unspecified: I47.10

## 2021-10-15 HISTORY — DX: Hemorrhage of anus and rectum: K62.5

## 2021-10-15 HISTORY — DX: Presence of urogenital implants: Z96.0

## 2021-10-15 HISTORY — DX: Palpitations: R00.2

## 2021-10-15 HISTORY — DX: Supraventricular tachycardia: I47.1

## 2021-10-15 HISTORY — DX: Uterovaginal prolapse, unspecified: N81.4

## 2021-10-15 HISTORY — DX: Decreased white blood cell count, unspecified: D72.819

## 2021-10-15 HISTORY — DX: Allergic rhinitis, unspecified: J30.9

## 2021-10-15 HISTORY — DX: Presence of spectacles and contact lenses: Z97.3

## 2021-10-15 HISTORY — DX: Hyperlipidemia, unspecified: E78.5

## 2021-10-15 HISTORY — DX: Other constipation: K59.09

## 2021-10-15 HISTORY — DX: Unspecified osteoarthritis, unspecified site: M19.90

## 2021-10-15 HISTORY — DX: Depression, unspecified: F32.A

## 2021-10-15 HISTORY — PX: TRANSANAL HEMORRHOIDAL DEARTERIALIZATION: SHX6136

## 2021-10-15 HISTORY — DX: Essential (primary) hypertension: I10

## 2021-10-15 HISTORY — DX: Unspecified hemorrhoids: K64.9

## 2021-10-15 HISTORY — DX: Iron deficiency anemia secondary to blood loss (chronic): D50.0

## 2021-10-15 HISTORY — DX: Personal history of other diseases of the respiratory system: Z87.09

## 2021-10-15 HISTORY — DX: Age-related osteoporosis without current pathological fracture: M81.0

## 2021-10-15 HISTORY — DX: Bradycardia, unspecified: R00.1

## 2021-10-15 HISTORY — DX: Unspecified chronic gastritis without bleeding: K29.50

## 2021-10-15 LAB — POCT I-STAT, CHEM 8
BUN: 18 mg/dL (ref 8–23)
Calcium, Ion: 1.28 mmol/L (ref 1.15–1.40)
Chloride: 101 mmol/L (ref 98–111)
Creatinine, Ser: 0.8 mg/dL (ref 0.44–1.00)
Glucose, Bld: 84 mg/dL (ref 70–99)
HCT: 38 % (ref 36.0–46.0)
Hemoglobin: 12.9 g/dL (ref 12.0–15.0)
Potassium: 4.8 mmol/L (ref 3.5–5.1)
Sodium: 142 mmol/L (ref 135–145)
TCO2: 31 mmol/L (ref 22–32)

## 2021-10-15 SURGERY — TRANSANAL HEMORRHOIDAL DEARTERIALIZATION
Anesthesia: Monitor Anesthesia Care

## 2021-10-15 MED ORDER — PROPOFOL 500 MG/50ML IV EMUL
INTRAVENOUS | Status: AC
  Filled 2021-10-15: qty 50

## 2021-10-15 MED ORDER — ACETAMINOPHEN 160 MG/5ML PO SOLN: 325.0000 mg | ORAL | Status: DC | PRN

## 2021-10-15 MED ORDER — ACETAMINOPHEN 325 MG PO TABS: 325.0000 mg | ORAL_TABLET | ORAL | Status: DC | PRN

## 2021-10-15 MED ORDER — EPHEDRINE SULFATE-NACL 50-0.9 MG/10ML-% IV SOSY
PREFILLED_SYRINGE | INTRAVENOUS | Status: DC | PRN
  Administered 2021-10-15 (×2): 10 mg via INTRAVENOUS
  Administered 2021-10-15: 5 mg via INTRAVENOUS
  Administered 2021-10-15 (×2): 10 mg via INTRAVENOUS

## 2021-10-15 MED ORDER — SODIUM CHLORIDE 0.9% FLUSH: 3.0000 mL | INTRAVENOUS | Status: DC | PRN

## 2021-10-15 MED ORDER — ONDANSETRON HCL 4 MG/2ML IJ SOLN: 4.0000 mg | Freq: Once | INTRAMUSCULAR | Status: DC | PRN

## 2021-10-15 MED ORDER — ONDANSETRON HCL 4 MG/2ML IJ SOLN
INTRAMUSCULAR | Status: DC | PRN
  Administered 2021-10-15: 4 mg via INTRAVENOUS

## 2021-10-15 MED ORDER — OXYCODONE HCL 5 MG PO TABS: 5.0000 mg | ORAL_TABLET | ORAL | Status: DC | PRN

## 2021-10-15 MED ORDER — PROPOFOL 10 MG/ML IV BOLUS
INTRAVENOUS | Status: DC | PRN
  Administered 2021-10-15 (×2): 30 mg via INTRAVENOUS

## 2021-10-15 MED ORDER — LIDOCAINE HCL (PF) 2 % IJ SOLN
INTRAMUSCULAR | Status: AC
  Filled 2021-10-15: qty 5

## 2021-10-15 MED ORDER — BUPIVACAINE LIPOSOME 1.3 % IJ SUSP
INTRAMUSCULAR | Status: DC | PRN
  Administered 2021-10-15: 20 mL

## 2021-10-15 MED ORDER — SODIUM CHLORIDE 0.9% FLUSH: 3.0000 mL | Freq: Two times a day (BID) | INTRAVENOUS | Status: DC

## 2021-10-15 MED ORDER — EPHEDRINE 5 MG/ML INJ
INTRAVENOUS | Status: AC
  Filled 2021-10-15: qty 5

## 2021-10-15 MED ORDER — OXYCODONE HCL 5 MG PO TABS: 5.0000 mg | ORAL_TABLET | Freq: Once | ORAL | Status: DC | PRN

## 2021-10-15 MED ORDER — ACETAMINOPHEN 500 MG PO TABS
1000.0000 mg | ORAL_TABLET | ORAL | Status: AC
  Administered 2021-10-15: 1000 mg via ORAL

## 2021-10-15 MED ORDER — 0.9 % SODIUM CHLORIDE (POUR BTL) OPTIME
TOPICAL | Status: DC | PRN
  Administered 2021-10-15: 500 mL

## 2021-10-15 MED ORDER — DEXMEDETOMIDINE (PRECEDEX) IN NS 20 MCG/5ML (4 MCG/ML) IV SYRINGE
PREFILLED_SYRINGE | INTRAVENOUS | Status: DC | PRN
  Administered 2021-10-15 (×3): 4 ug via INTRAVENOUS

## 2021-10-15 MED ORDER — LACTATED RINGERS IV SOLN: INTRAVENOUS | Status: DC

## 2021-10-15 MED ORDER — ACETAMINOPHEN 325 MG PO TABS: 650.0000 mg | ORAL_TABLET | ORAL | Status: DC | PRN

## 2021-10-15 MED ORDER — ACETAMINOPHEN 500 MG PO TABS
ORAL_TABLET | ORAL | Status: AC
  Filled 2021-10-15: qty 2

## 2021-10-15 MED ORDER — OXYCODONE HCL 5 MG PO TABS: 5.0000 mg | ORAL_TABLET | Freq: Four times a day (QID) | ORAL | 0 refills | Status: DC | PRN

## 2021-10-15 MED ORDER — ACETAMINOPHEN 325 MG RE SUPP: 650.0000 mg | RECTAL | Status: DC | PRN

## 2021-10-15 MED ORDER — LIDOCAINE HCL (CARDIAC) PF 100 MG/5ML IV SOSY
PREFILLED_SYRINGE | INTRAVENOUS | Status: DC | PRN
  Administered 2021-10-15: 50 mg via INTRAVENOUS

## 2021-10-15 MED ORDER — FENTANYL CITRATE (PF) 100 MCG/2ML IJ SOLN: 25.0000 ug | INTRAMUSCULAR | Status: DC | PRN

## 2021-10-15 MED ORDER — BUPIVACAINE-EPINEPHRINE 0.5% -1:200000 IJ SOLN
INTRAMUSCULAR | Status: DC | PRN
  Administered 2021-10-15: 30 mL

## 2021-10-15 MED ORDER — ONDANSETRON HCL 4 MG/2ML IJ SOLN
INTRAMUSCULAR | Status: AC
  Filled 2021-10-15: qty 2

## 2021-10-15 MED ORDER — PROPOFOL 500 MG/50ML IV EMUL
INTRAVENOUS | Status: DC | PRN
  Administered 2021-10-15: 200 ug/kg/min via INTRAVENOUS

## 2021-10-15 MED ORDER — BUPIVACAINE LIPOSOME 1.3 % IJ SUSP: 20.0000 mL | Freq: Once | INTRAMUSCULAR | Status: DC

## 2021-10-15 MED ORDER — OXYCODONE HCL 5 MG/5ML PO SOLN: 5.0000 mg | Freq: Once | ORAL | Status: DC | PRN

## 2021-10-15 MED ORDER — PROPOFOL 10 MG/ML IV BOLUS
INTRAVENOUS | Status: AC
  Filled 2021-10-15: qty 20

## 2021-10-15 MED ORDER — SODIUM CHLORIDE 0.9 % IV SOLN: 250.0000 mL | INTRAVENOUS | Status: DC | PRN

## 2021-10-15 MED ORDER — DEXMEDETOMIDINE HCL IN NACL 80 MCG/20ML IV SOLN
INTRAVENOUS | Status: AC
  Filled 2021-10-15: qty 20

## 2021-10-15 SURGICAL SUPPLY — 37 items
COVER BACK TABLE 60X90IN (DRAPES) ×2 IMPLANT
DECANTER SPIKE VIAL GLASS SM (MISCELLANEOUS) IMPLANT
DRAPE HYSTEROSCOPY (MISCELLANEOUS) ×2 IMPLANT
DRAPE SHEET LG 3/4 BI-LAMINATE (DRAPES) ×2 IMPLANT
DRSG PAD ABDOMINAL 8X10 ST (GAUZE/BANDAGES/DRESSINGS) ×1 IMPLANT
ELECT REM PT RETURN 9FT ADLT (ELECTROSURGICAL) ×2
ELECTRODE REM PT RTRN 9FT ADLT (ELECTROSURGICAL) ×1 IMPLANT
GAUZE 4X4 16PLY ~~LOC~~+RFID DBL (SPONGE) ×2 IMPLANT
GAUZE SPONGE 4X4 12PLY STRL (GAUZE/BANDAGES/DRESSINGS) IMPLANT
GAUZE SPONGE 4X4 3PLY NS LF (GAUZE/BANDAGES/DRESSINGS) ×1 IMPLANT
GLOVE BIO SURGEON STRL SZ 6.5 (GLOVE) ×2 IMPLANT
GLOVE BIOGEL PI IND STRL 7.0 (GLOVE) ×1 IMPLANT
GLOVE BIOGEL PI INDICATOR 7.0 (GLOVE) ×1
GLOVE INDICATOR 6.5 STRL GRN (GLOVE) ×2 IMPLANT
GOWN STRL REUS W/TWL XL LVL3 (GOWN DISPOSABLE) ×2 IMPLANT
HEMOSTAT SURGICEL 4X8 (HEMOSTASIS) IMPLANT
KIT SIGMOIDOSCOPE (SET/KITS/TRAYS/PACK) IMPLANT
KIT SLIDE ONE PROLAPS HEMORR (KITS) ×1 IMPLANT
KIT TURNOVER CYSTO (KITS) ×2 IMPLANT
LEGGING LITHOTOMY PAIR STRL (DRAPES) ×2 IMPLANT
LUBRICANT JELLY K Y 4OZ (MISCELLANEOUS) ×2 IMPLANT
MANIFOLD NEPTUNE II (INSTRUMENTS) ×1 IMPLANT
NEEDLE HYPO 22GX1.5 SAFETY (NEEDLE) ×2 IMPLANT
NS IRRIG 500ML POUR BTL (IV SOLUTION) ×1 IMPLANT
PACK BASIN DAY SURGERY FS (CUSTOM PROCEDURE TRAY) ×2 IMPLANT
PAD ARMBOARD 7.5X6 YLW CONV (MISCELLANEOUS) IMPLANT
PANTS MESH DISP LRG (UNDERPADS AND DIAPERS) ×1 IMPLANT
PENCIL SMOKE EVACUATOR (MISCELLANEOUS) ×2 IMPLANT
SPONGE HEMORRHOID 8X3CM (HEMOSTASIS) ×1 IMPLANT
SUT CHROMIC 2 0 SH (SUTURE) IMPLANT
SUT CHROMIC 3 0 SH 27 (SUTURE) IMPLANT
SUT VIC AB 2-0 UR6 27 (SUTURE) IMPLANT
SYR CONTROL 10ML LL (SYRINGE) ×2 IMPLANT
TOWEL OR 17X26 10 PK STRL BLUE (TOWEL DISPOSABLE) ×2 IMPLANT
TRAY DSU PREP LF (CUSTOM PROCEDURE TRAY) ×2 IMPLANT
TUBE CONNECTING 12X1/4 (SUCTIONS) ×2 IMPLANT
YANKAUER SUCT BULB TIP NO VENT (SUCTIONS) ×2 IMPLANT

## 2021-10-15 NOTE — Op Note (Signed)
10/15/2021  3:57 PM  PATIENT:  Belinda Harper  81 y.o. female  Patient Care Team: Panosh, Neta Mends, MD as PCP - General Hilty, Lisette Abu, MD as PCP - Cardiology (Cardiology) Maxie Better, MD (Obstetrics and Gynecology) Ernesto Rutherford, MD (Ophthalmology) Filbert Berthold, DDS (Dentistry) Cherlyn Roberts, MD as Consulting Physician (Dermatology) [name], MD as Consulting Physician (Hematology and Oncology) Ollen Gross, MD as Consulting Physician (Orthopedic Surgery)  PRE-OPERATIVE DIAGNOSIS:  RECTAL BLEEDING  POST-OPERATIVE DIAGNOSIS:  RECTAL BLEEDING  PROCEDURE:  TRANS HEMORRHOIDAL DEARTERIALIZATION    Surgeon(s): Romie Levee, MD  ASSISTANT: none   ANESTHESIA:   local and MAC  EBL:  50 ml Total I/O In: 1020 [P.O.:120; I.V.:900] Out: 200 [Urine:150; Blood:50]  DRAINS: none   SPECIMEN:  No Specimen  DISPOSITION OF SPECIMEN:  N/A  COUNTS:  YES  PLAN OF CARE: Discharge to home after PACU  PATIENT DISPOSITION:  PACU - hemodynamically stable.  INDICATION: 81 y.o. F with chronic lower GI bleeding and hemorrhoids that has failed rubber band ligation   OR FINDINGS: Large Grade 2, RA and RP hemorrhoids, smaller Grade 2 L lateral hemorrhoid  Description: Informed consent was confirmed. Patient underwent general anesthesia without difficulty. Patient was placed into lithotomy positioning.  The perianal region was prepped and draped in sterile fashion. Surgical time out confirmed or plan.  I did digital rectal examination and then transitioned over to anoscopy to get a sense of the anatomy.  I switched over to the [facility] fiberoptically lit Doppler anocope.   Using the Doppler on the tip of the THD anoscope, I identified the arterial hemorrhoidal vessels coming in in the classic hexagonal anatomical pattern (right posterior/lateral/anterior, left posterior /lateral/anterior).    I proceeded to ligate the hemorrhoidal arteries. I used a 2-0 Vicryl suture on a UR-6 needle  in a figure-of-eight fashion over the signal around 6 cm proximal to the anal verge. I then ran that stitch longitudinally more distally to the dentate line. I then tied that stitch down to cause a hemorrhoidopexy. I did that for all 6 locations.    I redid Doppler anoscopy. I Identified a signal at the posterior midline location.  I isolated and ligated this with a figure-of-eight stitch. Signals went away.  At completion of this, all hemorrhoids were reduced into the rectum.  There is no more prolapse. External anatomy looked normal.  I repeated anoscopy and examination.   Hemostasis was good. I placed a soft Gelfoam cylinder into the rectum. Patient is being extubated go to recovery room.  I am about to discuss the patient's status to the family.    Vanita Panda, MD  Colorectal and General Surgery [facility] Surgery

## 2021-10-15 NOTE — H&P (Signed)
Status: Signed Editor: Romie Levee, MD (Physician)      REFERRING PHYSICIAN:  Montel Culver, MD   PROVIDER:  Elenora Gamma, MD   MRN: [id number] DOB: 02/06/1941    Subjective    Chief Complaint: No chief complaint on file.       History of Present Illness: Belinda Harper is a 81 y.o. female who is seen today as an office consultation at the request of Dr. Vida Roller for evaluation of No chief complaint on file. .  80 year old female with anemia and chronic lower GI bleeding for many years.   Recent colonoscopy showed diverticula and nonbleeding external and large, ulcerated internal hemorrhoids.  Capsule endoscopy was relatively unremarkable.  It appears she is received at least 2 transfusions in the last few months.  She has noticed a prolapsing hemorrhoid for years.  She reduces this after bowel movements.  She reports some bleeding noted every other week, but this is rather minimal.     Review of Systems: A complete review of systems was obtained from the patient.  I have reviewed this information and discussed as appropriate with the patient.  See HPI as well for other ROS.     Medical History:     Past Medical History:  Diagnosis Date   Anemia     Anxiety     Arthritis     Asthma, unspecified asthma severity, unspecified whether complicated, unspecified whether persistent     COPD (chronic obstructive pulmonary disease) (CMS-HCC)     GERD (gastroesophageal reflux disease)        There is no problem list on file for this patient.          Past Surgical History:  Procedure Laterality Date   CATARACT EXTRACTION              Allergies  Allergen Reactions   Nsaids (Non-Steroidal Anti-Inflammatory Drug) Other (See Comments)   Sulfamethoxazole Rash            Current Outpatient Medications on File Prior to Visit  Medication Sig Dispense Refill   amLODIPine (NORVASC) 5 MG tablet Take 5 mg by mouth once daily       metoprolol succinate (TOPROL-XL)  25 MG XL tablet Take by mouth       ascorbic acid, vitamin C, 500 mg Chew Take by mouth       CALCIUM CITRATE-VITAMIN D2 ORAL Take by mouth       cholecalciferol, vitamin D3, (VITAMIN D3) 125 mcg (5,000 unit) tablet Take by mouth       denosumab (PROLIA) 60 mg/mL inj syringe Inject subcutaneously       docusate (COLACE) 250 MG capsule Take by mouth       hydrocortisone (ANUSOL-HC) 25 mg suppository USE 1 SUPPOSITORY RECTALLY ONCE DAILY FOR ONE WEEK       MAGNESIUM GLYCINATE ORAL Take by mouth       multivit/folic acid/vit K1 (ONE-A-DAY WOMEN'S 50 PLUS ORAL) One A Day       wheat dextrin/aspartame (BENEFIBER S/F, WHEAT DEXTRIN, ORAL) Take by mouth        No current facility-administered medications on file prior to visit.           Family History  Problem Relation Age of Onset   Coronary Artery Disease (Blocked arteries around heart) Father     Skin cancer Father     Obesity Son     High blood pressure (Hypertension) Son  Social History       Tobacco Use  Smoking Status Never  Smokeless Tobacco Never      Social History        Socioeconomic History   Marital status: Divorced  Tobacco Use   Smoking status: Never   Smokeless tobacco: Never  Substance and Sexual Activity   Alcohol use: Never   Drug use: Never      Objective:      Vitals:   10/15/21 0714  BP: 135/69  Pulse: 60  Resp: 14  Temp: 97.6 F (36.4 C)  SpO2: 99%    Exam Gen: NAD Abd: soft CV:RRR Lungs: CTA Rectal: min external disease     Labs, Imaging and Diagnostic Testing:   Procedure: Anoscopy (08/31/21) Surgeon: Maisie Fus After the risks and benefits were explained, written consent was obtained for above procedure.  A medical assistant chaperone was present thoroughout the entire procedure.  Anesthesia: none Diagnosis: rectal bleeding Findings: Grade 2 moderately inflamed internal hemorrhoids in all 3 positions   Assessment and Plan:  Diagnoses and all orders for this visit:    Grade II hemorrhoids       81 year old with chronic anemia and grade 2 hemorrhoids.  She also has a lot of anal canal inflammation.  We discussed that we can ligate her internal hemorrhoids and this will resolve bleeding from that source but she could continue to have bleeding from her anal canal inflammation or other sources in her colon higher up.  I believe she understands this and wishes to proceed with surgery, hoping that this would at least eliminate one source.  We discussed the risk of surgery which include bleeding, infection, urinary retention and other complications related to anesthesia.  All questions were answered.  Patient would like to proceed.   No follow-ups on file.

## 2021-10-18 ENCOUNTER — Encounter (HOSPITAL_BASED_OUTPATIENT_CLINIC_OR_DEPARTMENT_OTHER): Payer: Self-pay | Admitting: General Surgery

## 2021-11-05 ENCOUNTER — Encounter: Payer: Self-pay | Admitting: Hematology and Oncology

## 2021-11-05 ENCOUNTER — Telehealth: Payer: Self-pay | Admitting: Hematology and Oncology

## 2021-11-05 NOTE — Telephone Encounter (Signed)
Scheduled appointment per provider request. Left message with appointment details.

## 2021-11-08 ENCOUNTER — Inpatient Hospital Stay (HOSPITAL_BASED_OUTPATIENT_CLINIC_OR_DEPARTMENT_OTHER): Payer: Medicare Other | Admitting: Hematology and Oncology

## 2021-11-08 ENCOUNTER — Inpatient Hospital Stay: Payer: Medicare Other | Attending: Hematology and Oncology

## 2021-11-08 ENCOUNTER — Inpatient Hospital Stay: Payer: Medicare Other

## 2021-11-08 ENCOUNTER — Other Ambulatory Visit: Payer: Self-pay

## 2021-11-08 ENCOUNTER — Other Ambulatory Visit: Payer: Self-pay | Admitting: *Deleted

## 2021-11-08 ENCOUNTER — Encounter: Payer: Self-pay | Admitting: Hematology and Oncology

## 2021-11-08 VITALS — BP 131/41 | HR 76 | Temp 97.7°F | Resp 16 | Ht 66.0 in | Wt 153.4 lb

## 2021-11-08 DIAGNOSIS — D509 Iron deficiency anemia, unspecified: Secondary | ICD-10-CM

## 2021-11-08 DIAGNOSIS — D72819 Decreased white blood cell count, unspecified: Secondary | ICD-10-CM | POA: Diagnosis present

## 2021-11-08 DIAGNOSIS — K648 Other hemorrhoids: Secondary | ICD-10-CM | POA: Diagnosis present

## 2021-11-08 DIAGNOSIS — D500 Iron deficiency anemia secondary to blood loss (chronic): Secondary | ICD-10-CM

## 2021-11-08 DIAGNOSIS — D539 Nutritional anemia, unspecified: Secondary | ICD-10-CM

## 2021-11-08 DIAGNOSIS — Z79899 Other long term (current) drug therapy: Secondary | ICD-10-CM | POA: Insufficient documentation

## 2021-11-08 DIAGNOSIS — D649 Anemia, unspecified: Secondary | ICD-10-CM | POA: Diagnosis not present

## 2021-11-08 LAB — CBC WITH DIFFERENTIAL/PLATELET
Abs Immature Granulocytes: 0.01 10*3/uL (ref 0.00–0.07)
Basophils Absolute: 0 10*3/uL (ref 0.0–0.1)
Basophils Relative: 1 %
Eosinophils Absolute: 0.2 10*3/uL (ref 0.0–0.5)
Eosinophils Relative: 5 %
HCT: 24.7 % — ABNORMAL LOW (ref 36.0–46.0)
Hemoglobin: 7.4 g/dL — ABNORMAL LOW (ref 12.0–15.0)
Immature Granulocytes: 0 %
Lymphocytes Relative: 27 %
Lymphs Abs: 0.9 10*3/uL (ref 0.7–4.0)
MCH: 29.5 pg (ref 26.0–34.0)
MCHC: 30 g/dL (ref 30.0–36.0)
MCV: 98.4 fL (ref 80.0–100.0)
Monocytes Absolute: 0.3 10*3/uL (ref 0.1–1.0)
Monocytes Relative: 9 %
Neutro Abs: 2 10*3/uL (ref 1.7–7.7)
Neutrophils Relative %: 58 %
Platelets: 325 10*3/uL (ref 150–400)
RBC: 2.51 MIL/uL — ABNORMAL LOW (ref 3.87–5.11)
RDW: 17.2 % — ABNORMAL HIGH (ref 11.5–15.5)
WBC: 3.4 10*3/uL — ABNORMAL LOW (ref 4.0–10.5)
nRBC: 0 % (ref 0.0–0.2)

## 2021-11-08 LAB — IRON AND IRON BINDING CAPACITY (CC-WL,HP ONLY)
Iron: 37 ug/dL (ref 28–170)
Saturation Ratios: 15 % (ref 10.4–31.8)
TIBC: 253 ug/dL (ref 250–450)
UIBC: 216 ug/dL (ref 148–442)

## 2021-11-08 LAB — PREPARE RBC (CROSSMATCH)

## 2021-11-08 LAB — FERRITIN: Ferritin: 350 ng/mL — ABNORMAL HIGH (ref 11–307)

## 2021-11-08 LAB — SAMPLE TO BLOOD BANK

## 2021-11-08 MED ORDER — ACETAMINOPHEN 325 MG PO TABS
650.0000 mg | ORAL_TABLET | Freq: Once | ORAL | Status: AC
  Administered 2021-11-08: 650 mg via ORAL
  Filled 2021-11-08: qty 2

## 2021-11-08 MED ORDER — SODIUM CHLORIDE 0.9% IV SOLUTION
250.0000 mL | Freq: Once | INTRAVENOUS | Status: AC
  Administered 2021-11-08: 250 mL via INTRAVENOUS

## 2021-11-08 NOTE — Patient Instructions (Signed)
Blood Transfusion, Adult, Care After This sheet gives you information about how to care for yourself after your procedure. Your doctor may also give you more specific instructions. If you have problems or questions, contact your doctor. What can I expect after the procedure? After the procedure, it is common to have: Bruising and soreness at the IV site. A headache. Follow these instructions at home: Insertion site care     Follow instructions from your doctor about how to take care of your insertion site. This is where an IV tube was put into your vein. Make sure you: Wash your hands with soap and water before and after you change your bandage (dressing). If you cannot use soap and water, use hand sanitizer. Change your bandage as told by your doctor. Check your insertion site every day for signs of infection. Check for: Redness, swelling, or pain. Bleeding from the site. Warmth. Pus or a bad smell. General instructions Take over-the-counter and prescription medicines only as told by your doctor. Rest as told by your doctor. Go back to your normal activities as told by your doctor. Keep all follow-up visits as told by your doctor. This is important. Contact a doctor if: You have itching or red, swollen areas of skin (hives). You feel worried or nervous (anxious). You feel weak after doing your normal activities. You have redness, swelling, warmth, or pain around the insertion site. You have blood coming from the insertion site, and the blood does not stop with pressure. You have pus or a bad smell coming from the insertion site. Get help right away if: You have signs of a serious reaction. This may be coming from an allergy or the body's defense system (immune system). Signs include: Trouble breathing or shortness of breath. Swelling of the face or feeling warm (flushed). Fever or chills. Head, chest, or back pain. Dark pee (urine) or blood in the pee. Widespread rash. Fast  heartbeat. Feeling dizzy or light-headed. You may receive your blood transfusion in an outpatient setting. If so, you will be told whom to contact to report any reactions. These symptoms may be an emergency. Do not wait to see if the symptoms will go away. Get medical help right away. Call your local emergency services (911 in the [place] Do not drive yourself to the hospital. Summary Bruising and soreness at the IV site are common. Check your insertion site every day for signs of infection. Rest as told by your doctor. Go back to your normal activities as told by your doctor. Get help right away if you have signs of a serious reaction. This information is not intended to replace advice given to you by your health care provider. Make sure you discuss any questions you have with your health care provider. Document Revised: 08/06/2020 Document Reviewed: 10/04/2018 Elsevier Patient Education  2023 [employer]  

## 2021-11-08 NOTE — Assessment & Plan Note (Signed)
Unfortunately, she developed more bleeding after her recent hemorrhoid resection We will proceed with blood transfusion today and IV iron next week We discussed some of the risks, benefits, and alternatives of blood transfusions. The patient is symptomatic from anemia and the hemoglobin level is critically low.  Some of the side-effects to be expected including risks of transfusion reactions, chills, infection, syndrome of volume overload and risk of hospitalization from various reasons and the patient is willing to proceed and went ahead to sign consent today.

## 2021-11-08 NOTE — Progress Notes (Signed)
[facility] OFFICE PROGRESS NOTE  Panosh, Standley Brooking, MD  ASSESSMENT & PLAN:  Symptomatic anemia Unfortunately, she developed more bleeding after her recent hemorrhoid resection We will proceed with blood transfusion today and IV iron next week We discussed some of the risks, benefits, and alternatives of blood transfusions. The patient is symptomatic from anemia and the hemoglobin level is critically low.  Some of the side-effects to be expected including risks of transfusion reactions, chills, infection, syndrome of volume overload and risk of hospitalization from various reasons and the patient is willing to proceed and went ahead to sign consent today.   Orders Placed This Encounter  Procedures   Informed Consent Details: Physician/Practitioner Attestation; Transcribe to consent form and obtain patient signature    Standing Status:   Future    Standing Expiration Date:   11/09/2022    Order Specific Question:   Physician/Practitioner attestation of informed consent for blood and or blood product transfusion    Answer:   I, the physician/practitioner, attest that I have discussed with the patient the benefits, risks, side effects, alternatives, likelihood of achieving goals and potential problems during recovery for the procedure that I have provided informed consent.    Order Specific Question:   Product(s)    Answer:   All Product(s)   Care order/instruction    Transfuse Parameters    Standing Status:   Future    Standing Expiration Date:   11/08/2022   Type and screen         Standing Status:   Future    Number of Occurrences:   1    Standing Expiration Date:   11/09/2022    The total time spent in the appointment was 20 minutes encounter with patients including review of chart and various tests results, discussions about plan of care and coordination of care plan   All questions were answered. The patient knows to call the clinic with any problems, questions or  concerns. No barriers to learning was detected.    [name], MD 7/17/202311:10 AM  INTERVAL HISTORY: Belinda Harper 81 y.o. 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 Summary of her history is as follows: This is a pleasant woman with multifactorial anemia and associated leukopenia. 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 The chronic anemia and leukopenia go back as far as we have records (2007) when white counts ran as low as 2700. She has a normal differential. White count average is 3000 and has not changed. Platelet count is normal. Best hemoglobin she achieves with parenteral iron replacement is 11 g. She had a nondiagnostic bone marrow biopsy done in 2006. She had received intravenous iron infusion in the past. Her last colonoscopy in May 2013 showed severe diverticulosis with internal hemorrhoids. She feels well. Denies recent infection. She denies symptoms of fatigue. She tolerated iron supplement well. She had periodic hemorrhoidal bleeding. She was found to have abnormal CBC from intermittently over the years by her primary care doctor She has started taking oral iron supplements  since November of last year Most recently, she have noted passage of dark stool She attempted to increase her oral iron supplement but still complain of excessive fatigue Recently, her repeat CBC has dropped from 11-8.2 and hence she was referred back here for further evaluation On 12/15/20, she had repeat EGD  and colonoscopy which showed: - Severe diverticulosis in the sigmoid colon, in the descending colon, in the transverse colon and in the ascending colon. - Non-bleeding external and internal hemorrhoids. - No specimens collected.  She denies recent chest pain on exertion, shortness of breath on minimal exertion, pre-syncopal episodes, or palpitations.  The patient denies over the counter NSAID ingestion. She is not on antiplatelets agents. Her last colonoscopy was in 2013 She had no prior history or diagnosis of cancer. Her age appropriate screening programs are up-to-date. She denies any pica and eats a variety of diet.  The patient was prescribed oral iron supplements and she takes 1 dose of oral iron supplement at night to avoid constipation From Sept 2021 till present, she has received many doses of intravenous iron infusion On December 15, 2020, she had repeat upper endoscopy evaluation due to severe anemia which showed LA Grade B reflux esophagitis with no bleeding. 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 - Gastritis. Biopsied. - Normal examined duodenum.  Since Monoferric IV iron in October 2022, her iron deficiency resolved On May 28, 2021, she started on Aranesp injection  On 06/18/21, she had repeat GI procedure PROCEDURE NOTE: The patient presents with symptomatic grade II-III  hemorrhoids, requesting rubber band ligation of his/her hemorrhoidal disease.  All risks, benefits and alternative forms of therapy were described and informed consent was obtained.   In the Left Lateral Decubitus position anoscopic examination revealed grade II-III hemorrhoids in the left lateral and right posterior position(s).  The anorectum was pre-medicated with 0.125% Nitroglycerine and Recticare The decision was made to band the left lateral internal hemorrhoid, and the [place] was used to perform band ligation without  complication.  Digital anorectal examination was then performed to assure proper positioning of the band, and to adjust the banded tissue as required. The patient was discharged home without pain or other issues.  Dietary and behavioral recommendations were given and along with follow-up instructions.    In March 2023, she received both blood transfusion and intravenous iron infusion On October 15, 2021, she underwent ligation and resection of hemorrhoid  I have reviewed the past medical history, past surgical history, social history and family history with the patient and they are unchanged from previous note.  ALLERGIES:  is allergic to sulfamethoxazole, nsaids, and sulfa antibiotics.  MEDICATIONS:  Current Outpatient Medications  Medication Sig Dispense Refill   amLODipine (NORVASC) 5 MG tablet TAKE 1 TABLET (5 MG TOTAL) BY MOUTH DAILY. (Patient taking differently: Take 5 mg by mouth at bedtime.) 90 tablet 3   Calcium Citrate-Vitamin D (CITRACAL + D PO) Take 1 tablet by mouth daily with lunch. 800 mg calcium     denosumab (PROLIA) 60 MG/ML SOSY injection Inject 60 mg into the skin every 6 (six) months.     docusate sodium (COLACE) 100 MG capsule Take 100-200 mg by mouth 2 (two) times daily. Per pt takes one cap in am and two cap bedtime     hydrocortisone (ANUSOL-HC) 25 MG suppository Place 25 mg rectally as needed.     Magnesium Glycinate 665 MG CAPS Take 665 mg by mouth  2 (two) times daily.     metoprolol succinate (TOPROL-XL) 25 MG 24 hr tablet TAKE 1/2 TABLET (12.5 MG TOTAL) BY MOUTH AT BEDTIME 45 tablet 1   oxyCODONE (OXY IR/ROXICODONE) 5 MG immediate release tablet Take 1 tablet (5 mg total) by mouth every 6 (six) hours as needed for severe pain. 25 tablet 0   vitamin C (ASCORBIC ACID) 500 MG tablet Take 500 mg by mouth daily.     Vitamin D-Vitamin K (VITAMIN K2-VITAMIN D3 PO) Take 1 capsule by mouth daily. K2  800 mcg/ D3 5000 iu     Wheat Dextrin (BENEFIBER DRINK MIX PO) Take 1 Scoop  by mouth in the morning and at bedtime.     No current facility-administered medications for this visit.     REVIEW OF SYSTEMS:   Constitutional: Denies fevers, chills or night sweats Eyes: Denies blurriness of vision Ears, nose, mouth, throat, and face: Denies mucositis or sore throat Respiratory: Denies cough, dyspnea or wheezes Cardiovascular: Denies palpitation, chest discomfort or lower extremity swelling Gastrointestinal:  Denies nausea, heartburn or change in bowel habits Skin: Denies abnormal skin rashes Lymphatics: Denies new lymphadenopathy or easy bruising Behavioral/Psych: Mood is stable, no new changes  All other systems were reviewed with the patient and are negative.  PHYSICAL EXAMINATION: ECOG PERFORMANCE STATUS: 1 - Symptomatic but completely ambulatory  Vitals:   11/08/21 1101  BP: (!) 131/41  Pulse: 76  Resp: 16  Temp: 97.7 F (36.5 C)  SpO2: 99%   Filed Weights   11/08/21 1101  Weight: 153 lb 6.4 oz (69.6 kg)    GENERAL:alert, no distress and comfortable.  She looks pale NEURO: alert & oriented x 3 with fluent speech, no focal motor/sensory deficits  LABORATORY DATA:  I have reviewed the data as listed     Component Value Date/Time   NA 142 10/15/2021 0752   NA 142 05/28/2012 1600   K 4.8 10/15/2021 0752   K 3.7 05/28/2012 1600   CL 101 10/15/2021 0752   CL 103 05/28/2012 1600   CO2 29 05/17/2021 1205   CO2 29 05/28/2012 1600   GLUCOSE 84 10/15/2021 0752   GLUCOSE 99 05/28/2012 1600   BUN 18 10/15/2021 0752   BUN 19.3 05/28/2012 1600   CREATININE 0.80 10/15/2021 0752   CREATININE 0.85 05/17/2021 1205   CREATININE 0.88 12/06/2019 1147   CREATININE 0.8 05/28/2012 1600   CALCIUM 9.0 05/17/2021 1205   CALCIUM 9.5 05/28/2012 1600   PROT 7.8 12/09/2020 1443   PROT 7.3 05/28/2012 1600   ALBUMIN 4.4 12/09/2020 1443   ALBUMIN 3.5 05/28/2012 1600   AST 25 12/09/2020 1443   AST 23 05/28/2012 1600   ALT 15 12/09/2020 1443   ALT 13 05/28/2012  1600   ALKPHOS 79 12/09/2020 1443   ALKPHOS 82 05/28/2012 1600   BILITOT 0.3 12/09/2020 1443   BILITOT <0.20 05/28/2012 1600   GFRNONAA >60 05/17/2021 1205   GFRAA >60 04/23/2018 1634    No results found for: "SPEP", "UPEP"  Lab Results  Component Value Date   WBC 3.4 (L) 11/08/2021   NEUTROABS 2.0 11/08/2021   HGB 7.4 (L) 11/08/2021   HCT 24.7 (L) 11/08/2021   MCV 98.4 11/08/2021   PLT 325 11/08/2021      Chemistry      Component Value Date/Time   NA 142 10/15/2021 0752   NA 142 05/28/2012 1600   K 4.8 10/15/2021 0752   K 3.7 05/28/2012 1600   CL 101 10/15/2021  0752   CL 103 05/28/2012 1600   CO2 29 05/17/2021 1205   CO2 29 05/28/2012 1600   BUN 18 10/15/2021 0752   BUN 19.3 05/28/2012 1600   CREATININE 0.80 10/15/2021 0752   CREATININE 0.85 05/17/2021 1205   CREATININE 0.88 12/06/2019 1147   CREATININE 0.8 05/28/2012 1600      Component Value Date/Time   CALCIUM 9.0 05/17/2021 1205   CALCIUM 9.5 05/28/2012 1600   ALKPHOS 79 12/09/2020 1443   ALKPHOS 82 05/28/2012 1600   AST 25 12/09/2020 1443   AST 23 05/28/2012 1600   ALT 15 12/09/2020 1443   ALT 13 05/28/2012 1600   BILITOT 0.3 12/09/2020 1443   BILITOT <0.20 05/28/2012 1600

## 2021-11-09 LAB — TYPE AND SCREEN
ABO/RH(D): O POS
Antibody Screen: NEGATIVE
Unit division: 0

## 2021-11-09 LAB — BPAM RBC
Blood Product Expiration Date: 202308142359
ISSUE DATE / TIME: 202307171209
Unit Type and Rh: 5100

## 2021-11-18 ENCOUNTER — Inpatient Hospital Stay: Payer: Medicare Other

## 2021-11-18 ENCOUNTER — Inpatient Hospital Stay (HOSPITAL_BASED_OUTPATIENT_CLINIC_OR_DEPARTMENT_OTHER): Payer: Medicare Other | Admitting: Hematology and Oncology

## 2021-11-18 ENCOUNTER — Encounter: Payer: Self-pay | Admitting: Hematology and Oncology

## 2021-11-18 VITALS — BP 141/43 | HR 63 | Temp 98.0°F | Resp 18 | Ht 66.0 in | Wt 151.0 lb

## 2021-11-18 DIAGNOSIS — D509 Iron deficiency anemia, unspecified: Secondary | ICD-10-CM

## 2021-11-18 DIAGNOSIS — D72819 Decreased white blood cell count, unspecified: Secondary | ICD-10-CM | POA: Diagnosis not present

## 2021-11-18 DIAGNOSIS — D500 Iron deficiency anemia secondary to blood loss (chronic): Secondary | ICD-10-CM

## 2021-11-18 DIAGNOSIS — D539 Nutritional anemia, unspecified: Secondary | ICD-10-CM

## 2021-11-18 DIAGNOSIS — D649 Anemia, unspecified: Secondary | ICD-10-CM | POA: Diagnosis not present

## 2021-11-18 LAB — CBC WITH DIFFERENTIAL/PLATELET
Abs Immature Granulocytes: 0.01 10*3/uL (ref 0.00–0.07)
Basophils Absolute: 0 10*3/uL (ref 0.0–0.1)
Basophils Relative: 1 %
Eosinophils Absolute: 0.2 10*3/uL (ref 0.0–0.5)
Eosinophils Relative: 7 %
HCT: 32 % — ABNORMAL LOW (ref 36.0–46.0)
Hemoglobin: 9.7 g/dL — ABNORMAL LOW (ref 12.0–15.0)
Immature Granulocytes: 0 %
Lymphocytes Relative: 36 %
Lymphs Abs: 1.1 10*3/uL (ref 0.7–4.0)
MCH: 28.8 pg (ref 26.0–34.0)
MCHC: 30.3 g/dL (ref 30.0–36.0)
MCV: 95 fL (ref 80.0–100.0)
Monocytes Absolute: 0.3 10*3/uL (ref 0.1–1.0)
Monocytes Relative: 10 %
Neutro Abs: 1.4 10*3/uL — ABNORMAL LOW (ref 1.7–7.7)
Neutrophils Relative %: 46 %
Platelets: 256 10*3/uL (ref 150–400)
RBC: 3.37 MIL/uL — ABNORMAL LOW (ref 3.87–5.11)
RDW: 16.9 % — ABNORMAL HIGH (ref 11.5–15.5)
WBC: 2.9 10*3/uL — ABNORMAL LOW (ref 4.0–10.5)
nRBC: 0 % (ref 0.0–0.2)

## 2021-11-18 LAB — BASIC METABOLIC PANEL - CANCER CENTER ONLY
Anion gap: 4 — ABNORMAL LOW (ref 5–15)
BUN: 15 mg/dL (ref 8–23)
CO2: 32 mmol/L (ref 22–32)
Calcium: 9.3 mg/dL (ref 8.9–10.3)
Chloride: 105 mmol/L (ref 98–111)
Creatinine: 0.92 mg/dL (ref 0.44–1.00)
GFR, Estimated: >60 mL/min (ref >60)
Glucose, Bld: 73 mg/dL (ref 70–99)
Potassium: 4 mmol/L (ref 3.5–5.1)
Sodium: 141 mmol/L (ref 135–145)

## 2021-11-18 LAB — SAMPLE TO BLOOD BANK

## 2021-11-18 LAB — VITAMIN B12: Vitamin B-12: 496 pg/mL (ref 180–914)

## 2021-11-18 NOTE — Progress Notes (Signed)
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, probable chronic kidney disease with in appropriately lower level of erythropoietin level, severe GI bleed in the past causing iron deficiency and others She felt better after recent blood transfusion and her rectal bleeding has ceased Her blood count is stable Based on her recent iron studies, she does not need intravenous iron I plan to recheck B12 level and erythropoietin level to see if we can get insurance approval for darbepoetin injection in the future  Leukopenia She has chronic intermittent leukopenia This has been present since 2007, unknown etiology Recent repeat B12 level in July was adequate She is not symptomatic Observe for now  Orders Placed This Encounter  Procedures   Vitamin B12    Standing Status:   Future    Number of Occurrences:   1    Standing Expiration Date:   11/19/2022   Erythropoietin    Standing Status:   Future    Number of Occurrences:   1    Standing Expiration Date:   3/47/4259   Basic Metabolic Panel - [facility] Only    Standing Status:   Future    Number of Occurrences:   1    Standing Expiration Date:   11/19/2022    The total time spent in the appointment was 20 minutes encounter with patients including review of chart and various tests results, discussions about plan of care and coordination of care plan   All questions were answered. The patient knows to call the clinic with any problems, questions or concerns. No barriers to learning was detected.    [name], MD 7/27/20239:34 AM  INTERVAL HISTORY: Belinda Harper 81 y.o. 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 Summary of her history is as follows: This is a pleasant woman with multifactorial anemia and associated leukopenia. 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 The chronic anemia and leukopenia go back as far as we have records (2007) when white counts ran as low as 2700. She has a normal differential. White count average is 3000 and has not changed. Platelet count is normal. Best hemoglobin she achieves with parenteral iron replacement is 11 g. She had a nondiagnostic bone marrow biopsy done in 2006. She had received intravenous iron infusion in the past. Her last colonoscopy in May 2013 showed severe diverticulosis with internal hemorrhoids. She feels well. Denies recent infection. She denies symptoms of fatigue. She tolerated iron supplement well. She had periodic hemorrhoidal bleeding. She was found to have abnormal CBC from intermittently over the years by her primary care doctor She has started taking oral iron supplements since November of last year Most recently, she have noted passage of dark stool She attempted to increase her oral iron supplement but still complain of excessive fatigue Recently, her repeat CBC has dropped from 11-8.2 and hence she  was referred back here for further evaluation On 12/15/20, she had repeat EGD and colonoscopy which showed: - Severe diverticulosis in the sigmoid colon, in the descending colon, in the transverse colon and in the ascending colon. - Non-bleeding external and internal hemorrhoids. - No specimens collected.  She denies recent chest pain on exertion, shortness  of breath on minimal exertion, pre-syncopal episodes, or palpitations.  The patient denies over the counter NSAID ingestion. She is not on antiplatelets agents. Her last colonoscopy was in 2013 She had no prior history or diagnosis of cancer. Her age appropriate screening programs are up-to-date. She denies any pica and eats a variety of diet.  The patient was prescribed oral iron supplements and she takes 1 dose of oral iron supplement at night to avoid constipation From Sept 2021 till present, she has received many doses of intravenous iron infusion On December 15, 2020, she had repeat upper endoscopy evaluation due to severe anemia which showed LA Grade B reflux esophagitis with no bleeding. 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 - Gastritis. Biopsied. - Normal examined duodenum.  Since Monoferric IV iron in October 2022, her iron deficiency resolved On May 28, 2021, she started on Aranesp injection  On 06/18/21, she had repeat GI procedure PROCEDURE NOTE: The patient presents with symptomatic grade II-III  hemorrhoids, requesting rubber band ligation of his/her hemorrhoidal disease.  All risks, benefits and alternative forms of therapy were described and informed consent was obtained.   In the Left Lateral Decubitus position anoscopic examination revealed grade II-III hemorrhoids in the left lateral and right posterior position(s).  The anorectum was pre-medicated with 0.125% Nitroglycerine and Recticare The decision was made to band the left lateral internal hemorrhoid, and the [place] was used to perform band ligation without complication.  Digital anorectal examination was then performed to assure proper positioning of the band, and to adjust the banded tissue as required. The patient was discharged home without pain or other issues.  Dietary and behavioral recommendations were given and along with follow-up  instructions.    In March 2023, she received both blood transfusion and intravenous iron infusion On October 15, 2021, she underwent ligation and resection of hemorrhoid On 11/09/2011, she received another unit of blood due to severe rectal bleeding  I have reviewed the past medical history, past surgical history, social history and family history with the patient and they are unchanged from previous note.  ALLERGIES:  is allergic to sulfamethoxazole, nsaids, and sulfa antibiotics.  MEDICATIONS:  Current Outpatient Medications  Medication Sig Dispense Refill   amLODipine (NORVASC) 5 MG tablet TAKE 1 TABLET (5 MG TOTAL) BY MOUTH DAILY. (Patient taking differently: Take 5 mg by mouth at bedtime.) 90 tablet 3   Calcium Citrate-Vitamin D (CITRACAL + D PO) Take 1 tablet by mouth daily with lunch. 800 mg calcium     denosumab (PROLIA) 60 MG/ML SOSY injection Inject 60 mg into the skin every 6 (six) months.     docusate sodium (COLACE) 100 MG capsule Take 100-200 mg by mouth 2 (two) times daily. Per pt takes one cap in am and two cap bedtime     hydrocortisone (ANUSOL-HC) 25 MG suppository Place 25 mg rectally as needed.     Magnesium Glycinate 665 MG CAPS Take 665 mg by mouth 2 (two) times daily.     metoprolol succinate (TOPROL-XL) 25 MG 24 hr tablet TAKE 1/2 TABLET (12.5 MG TOTAL) BY MOUTH AT BEDTIME  45 tablet 1   oxyCODONE (OXY IR/ROXICODONE) 5 MG immediate release tablet Take 1 tablet (5 mg total) by mouth every 6 (six) hours as needed for severe pain. (Patient not taking: Reported on 11/08/2021) 25 tablet 0   vitamin C (ASCORBIC ACID) 500 MG tablet Take 500 mg by mouth daily.     Vitamin D-Vitamin K (VITAMIN K2-VITAMIN D3 PO) Take 1 capsule by mouth daily. K2  800 mcg/ D3 5000 iu     Wheat Dextrin (BENEFIBER DRINK MIX PO) Take 1 Scoop by mouth in the morning and at bedtime.     No current facility-administered medications for this visit.     REVIEW OF SYSTEMS:   Constitutional: Denies fevers,  chills or night sweats Eyes: Denies blurriness of vision Ears, nose, mouth, throat, and face: Denies mucositis or sore throat Respiratory: Denies cough, dyspnea or wheezes Cardiovascular: Denies palpitation, chest discomfort or lower extremity swelling Gastrointestinal:  Denies nausea, heartburn or change in bowel habits Skin: Denies abnormal skin rashes Lymphatics: Denies new lymphadenopathy or easy bruising Neurological:Denies numbness, tingling or new weaknesses Behavioral/Psych: Mood is stable, no new changes  All other systems were reviewed with the patient and are negative.  PHYSICAL EXAMINATION: ECOG PERFORMANCE STATUS: 1 - Symptomatic but completely ambulatory  Vitals:   11/18/21 0915  BP: (!) 141/43  Pulse: 63  Resp: 18  Temp: 98 F (36.7 C)  SpO2: 100%   Filed Weights   11/18/21 0915  Weight: 151 lb (68.5 kg)    GENERAL:alert, no distress and comfortable NEURO: alert & oriented x 3 with fluent speech, no focal motor/sensory deficits  LABORATORY DATA:  I have reviewed the data as listed     Component Value Date/Time   NA 142 10/15/2021 0752   NA 142 05/28/2012 1600   K 4.8 10/15/2021 0752   K 3.7 05/28/2012 1600   CL 101 10/15/2021 0752   CL 103 05/28/2012 1600   CO2 29 05/17/2021 1205   CO2 29 05/28/2012 1600   GLUCOSE 84 10/15/2021 0752   GLUCOSE 99 05/28/2012 1600   BUN 18 10/15/2021 0752   BUN 19.3 05/28/2012 1600   CREATININE 0.80 10/15/2021 0752   CREATININE 0.85 05/17/2021 1205   CREATININE 0.88 12/06/2019 1147   CREATININE 0.8 05/28/2012 1600   CALCIUM 9.0 05/17/2021 1205   CALCIUM 9.5 05/28/2012 1600   PROT 7.8 12/09/2020 1443   PROT 7.3 05/28/2012 1600   ALBUMIN 4.4 12/09/2020 1443   ALBUMIN 3.5 05/28/2012 1600   AST 25 12/09/2020 1443   AST 23 05/28/2012 1600   ALT 15 12/09/2020 1443   ALT 13 05/28/2012 1600   ALKPHOS 79 12/09/2020 1443   ALKPHOS 82 05/28/2012 1600   BILITOT 0.3 12/09/2020 1443   BILITOT <0.20 05/28/2012 1600    GFRNONAA >60 05/17/2021 1205   GFRAA >60 04/23/2018 1634    No results found for: "SPEP", "UPEP"  Lab Results  Component Value Date   WBC 2.9 (L) 11/18/2021   NEUTROABS 1.4 (L) 11/18/2021   HGB 9.7 (L) 11/18/2021   HCT 32.0 (L) 11/18/2021   MCV 95.0 11/18/2021   PLT 256 11/18/2021      Chemistry      Component Value Date/Time   NA 142 10/15/2021 0752   NA 142 05/28/2012 1600   K 4.8 10/15/2021 0752   K 3.7 05/28/2012 1600   CL 101 10/15/2021 0752   CL 103 05/28/2012 1600   CO2 29 05/17/2021 1205   CO2 29 05/28/2012 1600  BUN 18 10/15/2021 0752   BUN 19.3 05/28/2012 1600   CREATININE 0.80 10/15/2021 0752   CREATININE 0.85 05/17/2021 1205   CREATININE 0.88 12/06/2019 1147   CREATININE 0.8 05/28/2012 1600      Component Value Date/Time   CALCIUM 9.0 05/17/2021 1205   CALCIUM 9.5 05/28/2012 1600   ALKPHOS 79 12/09/2020 1443   ALKPHOS 82 05/28/2012 1600   AST 25 12/09/2020 1443   AST 23 05/28/2012 1600   ALT 15 12/09/2020 1443   ALT 13 05/28/2012 1600   BILITOT 0.3 12/09/2020 1443   BILITOT <0.20 05/28/2012 1600

## 2021-11-18 NOTE — Assessment & Plan Note (Signed)
She has chronic intermittent leukopenia This has been present since 2007, unknown etiology Recent repeat B12 level in July was adequate She is not symptomatic Observe for now

## 2021-11-18 NOTE — Assessment & Plan Note (Signed)
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

## 2021-11-20 LAB — ERYTHROPOIETIN: Erythropoietin: 59.7 m[IU]/mL — ABNORMAL HIGH (ref 2.6–18.5)

## 2021-11-29 ENCOUNTER — Inpatient Hospital Stay: Payer: Medicare Other | Attending: Hematology and Oncology

## 2021-11-29 ENCOUNTER — Inpatient Hospital Stay: Payer: Medicare Other

## 2021-11-29 VITALS — BP 137/59 | HR 62 | Temp 98.3°F | Resp 20

## 2021-11-29 DIAGNOSIS — D649 Anemia, unspecified: Secondary | ICD-10-CM | POA: Diagnosis present

## 2021-11-29 DIAGNOSIS — D500 Iron deficiency anemia secondary to blood loss (chronic): Secondary | ICD-10-CM

## 2021-11-29 DIAGNOSIS — N182 Chronic kidney disease, stage 2 (mild): Secondary | ICD-10-CM | POA: Diagnosis present

## 2021-11-29 DIAGNOSIS — D72819 Decreased white blood cell count, unspecified: Secondary | ICD-10-CM | POA: Insufficient documentation

## 2021-11-29 DIAGNOSIS — D539 Nutritional anemia, unspecified: Secondary | ICD-10-CM

## 2021-11-29 DIAGNOSIS — K648 Other hemorrhoids: Secondary | ICD-10-CM | POA: Diagnosis not present

## 2021-11-29 DIAGNOSIS — D509 Iron deficiency anemia, unspecified: Secondary | ICD-10-CM

## 2021-11-29 LAB — CBC WITH DIFFERENTIAL/PLATELET
Abs Immature Granulocytes: 0.01 10*3/uL (ref 0.00–0.07)
Basophils Absolute: 0.1 10*3/uL (ref 0.0–0.1)
Basophils Relative: 2 %
Eosinophils Absolute: 0.2 10*3/uL (ref 0.0–0.5)
Eosinophils Relative: 7 %
HCT: 32.6 % — ABNORMAL LOW (ref 36.0–46.0)
Hemoglobin: 10 g/dL — ABNORMAL LOW (ref 12.0–15.0)
Immature Granulocytes: 0 %
Lymphocytes Relative: 36 %
Lymphs Abs: 1.2 10*3/uL (ref 0.7–4.0)
MCH: 28.3 pg (ref 26.0–34.0)
MCHC: 30.7 g/dL (ref 30.0–36.0)
MCV: 92.4 fL (ref 80.0–100.0)
Monocytes Absolute: 0.4 10*3/uL (ref 0.1–1.0)
Monocytes Relative: 11 %
Neutro Abs: 1.5 10*3/uL — ABNORMAL LOW (ref 1.7–7.7)
Neutrophils Relative %: 44 %
Platelets: 215 10*3/uL (ref 150–400)
RBC: 3.53 MIL/uL — ABNORMAL LOW (ref 3.87–5.11)
RDW: 15.9 % — ABNORMAL HIGH (ref 11.5–15.5)
WBC: 3.3 10*3/uL — ABNORMAL LOW (ref 4.0–10.5)
nRBC: 0 % (ref 0.0–0.2)

## 2021-11-29 LAB — IRON AND IRON BINDING CAPACITY (CC-WL,HP ONLY)
Iron: 36 ug/dL (ref 28–170)
Saturation Ratios: 14 % (ref 10.4–31.8)
TIBC: 263 ug/dL (ref 250–450)
UIBC: 227 ug/dL (ref 148–442)

## 2021-11-29 LAB — FERRITIN: Ferritin: 232 ng/mL (ref 11–307)

## 2021-11-29 LAB — SAMPLE TO BLOOD BANK

## 2021-11-29 MED ORDER — DARBEPOETIN ALFA 300 MCG/0.6ML IJ SOSY
300.0000 ug | PREFILLED_SYRINGE | Freq: Once | INTRAMUSCULAR | Status: AC
  Administered 2021-11-29: 300 ug via SUBCUTANEOUS
  Filled 2021-11-29: qty 0.6

## 2021-11-29 NOTE — Patient Instructions (Signed)

## 2021-11-29 NOTE — Progress Notes (Signed)
Per Dr. Alvy Bimler, ok or Aranesp injection today. Hemoglobin 10.0 g/dL.

## 2021-12-13 ENCOUNTER — Other Ambulatory Visit: Payer: Self-pay

## 2021-12-13 ENCOUNTER — Inpatient Hospital Stay: Payer: Medicare Other

## 2021-12-13 ENCOUNTER — Inpatient Hospital Stay (HOSPITAL_BASED_OUTPATIENT_CLINIC_OR_DEPARTMENT_OTHER): Payer: Medicare Other | Admitting: Hematology and Oncology

## 2021-12-13 ENCOUNTER — Encounter: Payer: Self-pay | Admitting: Hematology and Oncology

## 2021-12-13 VITALS — BP 146/49 | HR 60 | Temp 97.4°F | Resp 18 | Ht 66.0 in | Wt 150.0 lb

## 2021-12-13 DIAGNOSIS — D509 Iron deficiency anemia, unspecified: Secondary | ICD-10-CM

## 2021-12-13 DIAGNOSIS — N939 Abnormal uterine and vaginal bleeding, unspecified: Secondary | ICD-10-CM | POA: Diagnosis not present

## 2021-12-13 DIAGNOSIS — N182 Chronic kidney disease, stage 2 (mild): Secondary | ICD-10-CM | POA: Diagnosis not present

## 2021-12-13 DIAGNOSIS — D539 Nutritional anemia, unspecified: Secondary | ICD-10-CM

## 2021-12-13 DIAGNOSIS — D500 Iron deficiency anemia secondary to blood loss (chronic): Secondary | ICD-10-CM

## 2021-12-13 DIAGNOSIS — D649 Anemia, unspecified: Secondary | ICD-10-CM | POA: Diagnosis not present

## 2021-12-13 DIAGNOSIS — D72819 Decreased white blood cell count, unspecified: Secondary | ICD-10-CM

## 2021-12-13 LAB — CBC WITH DIFFERENTIAL/PLATELET
Abs Immature Granulocytes: 0 10*3/uL (ref 0.00–0.07)
Basophils Absolute: 0 10*3/uL (ref 0.0–0.1)
Basophils Relative: 1 %
Eosinophils Absolute: 0.2 10*3/uL (ref 0.0–0.5)
Eosinophils Relative: 5 %
HCT: 37.9 % (ref 36.0–46.0)
Hemoglobin: 11.5 g/dL — ABNORMAL LOW (ref 12.0–15.0)
Immature Granulocytes: 0 %
Lymphocytes Relative: 41 %
Lymphs Abs: 1.5 10*3/uL (ref 0.7–4.0)
MCH: 27.4 pg (ref 26.0–34.0)
MCHC: 30.3 g/dL (ref 30.0–36.0)
MCV: 90.2 fL (ref 80.0–100.0)
Monocytes Absolute: 0.3 10*3/uL (ref 0.1–1.0)
Monocytes Relative: 10 %
Neutro Abs: 1.5 10*3/uL — ABNORMAL LOW (ref 1.7–7.7)
Neutrophils Relative %: 43 %
Platelets: 228 10*3/uL (ref 150–400)
RBC: 4.2 MIL/uL (ref 3.87–5.11)
RDW: 16.1 % — ABNORMAL HIGH (ref 11.5–15.5)
WBC: 3.5 10*3/uL — ABNORMAL LOW (ref 4.0–10.5)
nRBC: 0 % (ref 0.0–0.2)

## 2021-12-13 LAB — SAMPLE TO BLOOD BANK

## 2021-12-13 NOTE — Progress Notes (Signed)
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, probable chronic kidney disease with in appropriately lower level of erythropoietin level, severe GI bleed in the past causing iron deficiency and others She continues to have intermittent rectal bleeding and most recently some vaginal bleeding which she believes related to pessary Her blood count is improved after recent darbepoetin injection Based on her recent iron studies, she does not need intravenous iron I recommend repeat CBC and darbepoetin injection every 2 weeks and I will see her back in 2 months  Vaginal bleeding She had recent vaginal spotting She has appointment pending to see her gynecologist next month She has pessary in situ and she think it was related to pessary  Leukopenia She has chronic intermittent leukopenia This has been present since 2007, unknown etiology Recent repeat B12 level in July was adequate She is not symptomatic Observe for now  Orders Placed This Encounter  Procedures   CBC with Differential/Platelet    Standing Status:   Standing    Number of Occurrences:   22    Standing Expiration Date:   12/14/2022    The total time spent in the appointment was 20 minutes encounter with patients including review of chart and various tests results, discussions about plan of care and coordination of care plan   All questions were answered. The patient knows to call the clinic with any problems, questions or concerns. No barriers to learning was detected.    [name], MD 8/21/20239:48 AM  INTERVAL HISTORY: Belinda Harper 81 y.o. female returns for further evaluation and follow-up for chronic pancytopenia She is doing well She has minimum trace rectal bleeding but noticed some recent vaginal bleeding which she believes is coming from her pessary She have some sinus  congestion  SUMMARY OF HEMATOLOGIC HISTORY:  Belinda Harper is seen because of recurrent pancytopenia I have not seen her since 2015 She was transferred to my care after her prior physician has left I reviewed the patient's records extensive and collaborated the history with the patient. Summary of her history is as follows: This is a pleasant woman with multifactorial anemia and associated leukopenia. 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 The chronic anemia and leukopenia go back as far as we have records (2007) when white counts ran as low as 2700. She has a normal differential. White count average is 3000 and has not changed. Platelet count is normal. Best hemoglobin she achieves with parenteral iron replacement is 11 g. She had a nondiagnostic bone marrow biopsy done in 2006. She had received intravenous iron infusion in the past. Her last colonoscopy in May 2013 showed severe diverticulosis with internal hemorrhoids. She feels well. Denies recent infection. She denies symptoms of fatigue. She tolerated iron supplement well. She had periodic hemorrhoidal bleeding. She was found to have abnormal CBC from intermittently over the years by her primary care doctor She has started taking oral iron supplements since November of last year Most recently, she have noted passage of dark stool She attempted to increase her oral iron supplement but still complain of excessive fatigue Recently, her repeat CBC has dropped from 11-8.2 and hence she was referred back here for further evaluation On 12/15/20, she had repeat EGD and colonoscopy which showed: - Severe diverticulosis in the sigmoid colon, in  the descending colon, in the transverse colon and in the ascending colon. - Non-bleeding external and internal hemorrhoids. - No specimens collected.  She denies recent chest pain on exertion, shortness of breath on minimal exertion,  pre-syncopal episodes, or palpitations.  The patient denies over the counter NSAID ingestion. She is not on antiplatelets agents. Her last colonoscopy was in 2013 She had no prior history or diagnosis of cancer. Her age appropriate screening programs are up-to-date. She denies any pica and eats a variety of diet.  The patient was prescribed oral iron supplements and she takes 1 dose of oral iron supplement at night to avoid constipation From Sept 2021 till present, she has received many doses of intravenous iron infusion On December 15, 2020, she had repeat upper endoscopy evaluation due to severe anemia which showed LA Grade B reflux esophagitis with no bleeding. 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 - Gastritis. Biopsied. - Normal examined duodenum.  Since Monoferric IV iron in October 2022, her iron deficiency resolved On May 28, 2021, she started on Aranesp injection  On 06/18/21, she had repeat GI procedure PROCEDURE NOTE: The patient presents with symptomatic grade II-III  hemorrhoids, requesting rubber band ligation of his/her hemorrhoidal disease.  All risks, benefits and alternative forms of therapy were described and informed consent was obtained.   In the Left Lateral Decubitus position anoscopic examination revealed grade II-III hemorrhoids in the left lateral and right posterior position(s).  The anorectum was pre-medicated with 0.125% Nitroglycerine and Recticare The decision was made to band the left lateral internal hemorrhoid, and the Hedley was used to perform band ligation without complication.  Digital anorectal examination was then performed to assure proper positioning of the band, and to adjust the banded tissue as required. The patient was discharged home without pain or other issues.  Dietary and behavioral recommendations were given and along with follow-up instructions.    In March 2023,  she received both blood transfusion and intravenous iron infusion On October 15, 2021, she underwent ligation and resection of hemorrhoid On 11/09/2011, she received another unit of blood due to severe rectal bleeding On 11/29/2021, she received 1 dose of darbepoetin injection  I have reviewed the past medical history, past surgical history, social history and family history with the patient and they are unchanged from previous note.  ALLERGIES:  is allergic to sulfamethoxazole, nsaids, and sulfa antibiotics.  MEDICATIONS:  Current Outpatient Medications  Medication Sig Dispense Refill   amLODipine (NORVASC) 5 MG tablet TAKE 1 TABLET (5 MG TOTAL) BY MOUTH DAILY. (Patient taking differently: Take 5 mg by mouth at bedtime.) 90 tablet 3   Calcium Citrate-Vitamin D (CITRACAL + D PO) Take 1 tablet by mouth daily with lunch. 800 mg calcium     denosumab (PROLIA) 60 MG/ML SOSY injection Inject 60 mg into the skin every 6 (six) months.     docusate sodium (COLACE) 100 MG capsule Take 100-200 mg by mouth 2 (two) times daily. Per pt takes one cap in am and two cap bedtime     hydrocortisone (ANUSOL-HC) 25 MG suppository Place 25 mg rectally as needed.     Magnesium Glycinate 665 MG CAPS Take 665 mg by mouth 2 (two) times daily.     metoprolol succinate (TOPROL-XL) 25 MG 24 hr tablet TAKE 1/2 TABLET (12.5 MG TOTAL) BY MOUTH AT BEDTIME 45 tablet 1   Vitamin D-Vitamin K (VITAMIN K2-VITAMIN D3 PO) Take 1 capsule by  mouth daily. K2  800 mcg/ D3 5000 iu     Wheat Dextrin (BENEFIBER DRINK MIX PO) Take 1 Scoop by mouth in the morning and at bedtime.     No current facility-administered medications for this visit.     REVIEW OF SYSTEMS:   Constitutional: Denies fevers, chills or night sweats Eyes: Denies blurriness of vision Ears, nose, mouth, throat, and face: Denies mucositis or sore throat Respiratory: Denies cough, dyspnea or wheezes Cardiovascular: Denies palpitation, chest discomfort or lower extremity  swelling Gastrointestinal:  Denies nausea, heartburn or change in bowel habits Skin: Denies abnormal skin rashes Lymphatics: Denies new lymphadenopathy or easy bruising Neurological:Denies numbness, tingling or new weaknesses Behavioral/Psych: Mood is stable, no new changes  All other systems were reviewed with the patient and are negative.  PHYSICAL EXAMINATION: ECOG PERFORMANCE STATUS: 0 - Asymptomatic  Vitals:   12/13/21 0922  BP: (!) 146/49  Pulse: 60  Resp: 18  Temp: (!) 97.4 F (36.3 C)  SpO2: 100%   Filed Weights   12/13/21 0922  Weight: 150 lb (68 kg)    GENERAL:alert, no distress and comfortable NEURO: alert & oriented x 3 with fluent speech, no focal motor/sensory deficits  LABORATORY DATA:  I have reviewed the data as listed     Component Value Date/Time   NA 141 11/18/2021 0932   NA 142 05/28/2012 1600   K 4.0 11/18/2021 0932   K 3.7 05/28/2012 1600   CL 105 11/18/2021 0932   CL 103 05/28/2012 1600   CO2 32 11/18/2021 0932   CO2 29 05/28/2012 1600   GLUCOSE 73 11/18/2021 0932   GLUCOSE 99 05/28/2012 1600   BUN 15 11/18/2021 0932   BUN 19.3 05/28/2012 1600   CREATININE 0.92 11/18/2021 0932   CREATININE 0.88 12/06/2019 1147   CREATININE 0.8 05/28/2012 1600   CALCIUM 9.3 11/18/2021 0932   CALCIUM 9.5 05/28/2012 1600   PROT 7.8 12/09/2020 1443   PROT 7.3 05/28/2012 1600   ALBUMIN 4.4 12/09/2020 1443   ALBUMIN 3.5 05/28/2012 1600   AST 25 12/09/2020 1443   AST 23 05/28/2012 1600   ALT 15 12/09/2020 1443   ALT 13 05/28/2012 1600   ALKPHOS 79 12/09/2020 1443   ALKPHOS 82 05/28/2012 1600   BILITOT 0.3 12/09/2020 1443   BILITOT <0.20 05/28/2012 1600   GFRNONAA >60 11/18/2021 0932   GFRAA >60 04/23/2018 1634    No results found for: "SPEP", "UPEP"  Lab Results  Component Value Date   WBC 3.5 (L) 12/13/2021   NEUTROABS 1.5 (L) 12/13/2021   HGB 11.5 (L) 12/13/2021   HCT 37.9 12/13/2021   MCV 90.2 12/13/2021   PLT 228 12/13/2021       Chemistry      Component Value Date/Time   NA 141 11/18/2021 0932   NA 142 05/28/2012 1600   K 4.0 11/18/2021 0932   K 3.7 05/28/2012 1600   CL 105 11/18/2021 0932   CL 103 05/28/2012 1600   CO2 32 11/18/2021 0932   CO2 29 05/28/2012 1600   BUN 15 11/18/2021 0932   BUN 19.3 05/28/2012 1600   CREATININE 0.92 11/18/2021 0932   CREATININE 0.88 12/06/2019 1147   CREATININE 0.8 05/28/2012 1600      Component Value Date/Time   CALCIUM 9.3 11/18/2021 0932   CALCIUM 9.5 05/28/2012 1600   ALKPHOS 79 12/09/2020 1443   ALKPHOS 82 05/28/2012 1600   AST 25 12/09/2020 1443   AST 23 05/28/2012 1600   ALT 15  12/09/2020 1443   ALT 13 05/28/2012 1600   BILITOT 0.3 12/09/2020 1443   BILITOT <0.20 05/28/2012 1600

## 2021-12-13 NOTE — Assessment & Plan Note (Signed)
She has chronic intermittent leukopenia This has been present since 2007, unknown etiology Recent repeat B12 level in July was adequate She is not symptomatic Observe for now

## 2021-12-13 NOTE — Assessment & Plan Note (Signed)
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

## 2021-12-13 NOTE — Assessment & Plan Note (Signed)
She had recent vaginal spotting She has appointment pending to see her gynecologist next month She has pessary in situ and she think it was related to pessary

## 2021-12-30 ENCOUNTER — Other Ambulatory Visit: Payer: Self-pay

## 2021-12-30 ENCOUNTER — Inpatient Hospital Stay: Payer: Medicare Other | Attending: Hematology and Oncology

## 2021-12-30 ENCOUNTER — Other Ambulatory Visit: Payer: Self-pay | Admitting: Hematology and Oncology

## 2021-12-30 ENCOUNTER — Telehealth: Payer: Self-pay

## 2021-12-30 ENCOUNTER — Inpatient Hospital Stay: Payer: Medicare Other

## 2021-12-30 DIAGNOSIS — K648 Other hemorrhoids: Secondary | ICD-10-CM | POA: Diagnosis not present

## 2021-12-30 DIAGNOSIS — D72819 Decreased white blood cell count, unspecified: Secondary | ICD-10-CM | POA: Insufficient documentation

## 2021-12-30 DIAGNOSIS — N182 Chronic kidney disease, stage 2 (mild): Secondary | ICD-10-CM | POA: Diagnosis not present

## 2021-12-30 DIAGNOSIS — D649 Anemia, unspecified: Secondary | ICD-10-CM | POA: Insufficient documentation

## 2021-12-30 DIAGNOSIS — D539 Nutritional anemia, unspecified: Secondary | ICD-10-CM

## 2021-12-30 DIAGNOSIS — D509 Iron deficiency anemia, unspecified: Secondary | ICD-10-CM

## 2021-12-30 LAB — CBC WITH DIFFERENTIAL/PLATELET
Abs Immature Granulocytes: 0.01 10*3/uL (ref 0.00–0.07)
Basophils Absolute: 0.1 10*3/uL (ref 0.0–0.1)
Basophils Relative: 2 %
Eosinophils Absolute: 0.2 10*3/uL (ref 0.0–0.5)
Eosinophils Relative: 6 %
HCT: 37 % (ref 36.0–46.0)
Hemoglobin: 11.2 g/dL — ABNORMAL LOW (ref 12.0–15.0)
Immature Granulocytes: 0 %
Lymphocytes Relative: 44 %
Lymphs Abs: 1.5 10*3/uL (ref 0.7–4.0)
MCH: 26.8 pg (ref 26.0–34.0)
MCHC: 30.3 g/dL (ref 30.0–36.0)
MCV: 88.5 fL (ref 80.0–100.0)
Monocytes Absolute: 0.3 10*3/uL (ref 0.1–1.0)
Monocytes Relative: 9 %
Neutro Abs: 1.3 10*3/uL — ABNORMAL LOW (ref 1.7–7.7)
Neutrophils Relative %: 39 %
Platelets: 155 10*3/uL (ref 150–400)
RBC: 4.18 MIL/uL (ref 3.87–5.11)
RDW: 15.9 % — ABNORMAL HIGH (ref 11.5–15.5)
WBC: 3.2 10*3/uL — ABNORMAL LOW (ref 4.0–10.5)
nRBC: 0 % (ref 0.0–0.2)

## 2021-12-30 LAB — IRON AND IRON BINDING CAPACITY (CC-WL,HP ONLY)
Iron: 19 ug/dL — ABNORMAL LOW (ref 28–170)
Saturation Ratios: 6 % — ABNORMAL LOW (ref 10.4–31.8)
TIBC: 297 ug/dL (ref 250–450)
UIBC: 278 ug/dL (ref 148–442)

## 2021-12-30 LAB — SAMPLE TO BLOOD BANK

## 2021-12-30 LAB — FERRITIN: Ferritin: 82 ng/mL (ref 11–307)

## 2021-12-30 NOTE — Telephone Encounter (Signed)
-----   Message from [name], MD sent at 12/30/2021  3:44 PM EDT ----- Pls call her, iron studies came back borderline low She needs 1 dose of IV iron before her next inj I will send scheduling msg for her to come in just for 1 dose, no need repeat labs

## 2021-12-30 NOTE — Progress Notes (Signed)
Pt is here for Aranesp, her Hgb is 11.2 therefore she does not meet the parameters to receive this injection today. Labs printed, pt verbalized understanding.

## 2021-12-30 NOTE — Telephone Encounter (Signed)
Called and given below message. She verbalized understanding. 

## 2022-01-04 LAB — HM DEXA SCAN

## 2022-01-10 ENCOUNTER — Other Ambulatory Visit: Payer: Self-pay

## 2022-01-10 ENCOUNTER — Inpatient Hospital Stay: Payer: Medicare Other

## 2022-01-10 VITALS — BP 134/54 | HR 59 | Temp 98.2°F | Resp 18

## 2022-01-10 DIAGNOSIS — D649 Anemia, unspecified: Secondary | ICD-10-CM

## 2022-01-10 DIAGNOSIS — D539 Nutritional anemia, unspecified: Secondary | ICD-10-CM

## 2022-01-10 DIAGNOSIS — N182 Chronic kidney disease, stage 2 (mild): Secondary | ICD-10-CM | POA: Diagnosis not present

## 2022-01-10 MED ORDER — SODIUM CHLORIDE 0.9 % IV SOLN: INTRAVENOUS | Status: DC

## 2022-01-10 MED ORDER — SODIUM CHLORIDE 0.9 % IV SOLN
400.0000 mg | Freq: Once | INTRAVENOUS | Status: AC
  Administered 2022-01-10: 400 mg via INTRAVENOUS
  Filled 2022-01-10: qty 20

## 2022-01-10 NOTE — Progress Notes (Signed)
Pt left 20 min post iron infusion. No complaints. VSS at discharge.

## 2022-01-11 ENCOUNTER — Encounter: Payer: Self-pay | Admitting: Obstetrics & Gynecology

## 2022-01-11 ENCOUNTER — Ambulatory Visit (INDEPENDENT_AMBULATORY_CARE_PROVIDER_SITE_OTHER): Payer: Medicare Other | Admitting: Obstetrics & Gynecology

## 2022-01-11 VITALS — BP 120/78 | HR 66

## 2022-01-11 DIAGNOSIS — Z4689 Encounter for fitting and adjustment of other specified devices: Secondary | ICD-10-CM

## 2022-01-11 DIAGNOSIS — N950 Postmenopausal bleeding: Secondary | ICD-10-CM | POA: Diagnosis not present

## 2022-01-11 NOTE — Progress Notes (Signed)
    Belinda Harper 06/28/1940 [id number]        81 y.o.  [id number]   RP: PMB with pessary  HPI: Had hemorrhoid surgery about 1 month ago associated with rectal bleeding.  3 weeks ago, had spotting for 1 week which was from the vagina.  No current vaginal bleeding.  Milex ring #5 works well otherwise.  Postmenopause on no HRT.  No pelvic pain.  No vaginal discharge.   OB History  Gravida Para Term Preterm AB Living  '3 2 2   1 2  '$ SAB IAB Ectopic Multiple Live Births  1            # Outcome Date GA Lbr Len/2nd Weight Sex Delivery Anes PTL Lv  3 SAB           2 Term           1 Term             Past medical history,surgical history, problem list, medications, allergies, family history and social history were all reviewed and documented in the EPIC chart.   Directed ROS with pertinent positives and negatives documented in the history of present illness/assessment and plan.  Exam:  Vitals:   01/11/22 1427  BP: 120/78  Pulse: 66  SpO2: 96%   General appearance:  Normal  Gynecologic exam: Vulva normal.  Pessary removed with a tinge of blood.  No abnormal discharge.  Speculum:  Very atrophic vagina with mild inflammation, no erosion or ulcer.  Cervix normal, no polyp.  Pessary reinserted vaginally.  Pessary tends to stay in the axis of the vagina.     Assessment/Plan:  81 y.o. [id number]   1. Postmenopausal bleeding Probably associated with pessary causing mild inflammation of the postmenopausal atrophic vagina.  Decision to complete the investigation with a Pelvic US at f/u.  If no other cause for PMB, will consider changing to a smaller pessary, Milex ring #4 with support pessary. - US Transvaginal Non-OB; Future  2. Encounter for pessary maintenance Consider trying a #4 Milex ring with support at f/u.  Other orders - Menaquinone-7 (K2 PO); Take by mouth.   [name] MD, 2:44 PM 01/11/2022

## 2022-01-13 ENCOUNTER — Inpatient Hospital Stay: Payer: Medicare Other

## 2022-01-13 ENCOUNTER — Other Ambulatory Visit: Payer: Self-pay

## 2022-01-13 DIAGNOSIS — D539 Nutritional anemia, unspecified: Secondary | ICD-10-CM

## 2022-01-13 DIAGNOSIS — N182 Chronic kidney disease, stage 2 (mild): Secondary | ICD-10-CM | POA: Diagnosis not present

## 2022-01-13 DIAGNOSIS — D509 Iron deficiency anemia, unspecified: Secondary | ICD-10-CM

## 2022-01-13 LAB — CBC WITH DIFFERENTIAL/PLATELET
Abs Immature Granulocytes: 0.01 10*3/uL (ref 0.00–0.07)
Basophils Absolute: 0 10*3/uL (ref 0.0–0.1)
Basophils Relative: 1 %
Eosinophils Absolute: 0.2 10*3/uL (ref 0.0–0.5)
Eosinophils Relative: 5 %
HCT: 36.6 % (ref 36.0–46.0)
Hemoglobin: 11.2 g/dL — ABNORMAL LOW (ref 12.0–15.0)
Immature Granulocytes: 0 %
Lymphocytes Relative: 51 %
Lymphs Abs: 2 10*3/uL (ref 0.7–4.0)
MCH: 26.5 pg (ref 26.0–34.0)
MCHC: 30.6 g/dL (ref 30.0–36.0)
MCV: 86.7 fL (ref 80.0–100.0)
Monocytes Absolute: 0.4 10*3/uL (ref 0.1–1.0)
Monocytes Relative: 9 %
Neutro Abs: 1.3 10*3/uL — ABNORMAL LOW (ref 1.7–7.7)
Neutrophils Relative %: 34 %
Platelets: 166 10*3/uL (ref 150–400)
RBC: 4.22 MIL/uL (ref 3.87–5.11)
RDW: 16.7 % — ABNORMAL HIGH (ref 11.5–15.5)
WBC: 3.8 10*3/uL — ABNORMAL LOW (ref 4.0–10.5)
nRBC: 0 % (ref 0.0–0.2)

## 2022-01-13 LAB — SAMPLE TO BLOOD BANK

## 2022-01-17 LAB — HM MAMMOGRAPHY

## 2022-01-21 ENCOUNTER — Other Ambulatory Visit: Payer: Self-pay | Admitting: Internal Medicine

## 2022-01-21 ENCOUNTER — Encounter: Payer: Self-pay | Admitting: Internal Medicine

## 2022-01-21 DIAGNOSIS — I10 Essential (primary) hypertension: Secondary | ICD-10-CM

## 2022-01-27 ENCOUNTER — Telehealth: Payer: Self-pay

## 2022-01-27 ENCOUNTER — Inpatient Hospital Stay: Payer: Medicare Other | Attending: Hematology and Oncology

## 2022-01-27 ENCOUNTER — Inpatient Hospital Stay: Payer: Medicare Other

## 2022-01-27 DIAGNOSIS — D509 Iron deficiency anemia, unspecified: Secondary | ICD-10-CM

## 2022-01-27 DIAGNOSIS — D500 Iron deficiency anemia secondary to blood loss (chronic): Secondary | ICD-10-CM | POA: Insufficient documentation

## 2022-01-27 DIAGNOSIS — D539 Nutritional anemia, unspecified: Secondary | ICD-10-CM

## 2022-01-27 LAB — CBC WITH DIFFERENTIAL/PLATELET
Abs Immature Granulocytes: 0 10*3/uL (ref 0.00–0.07)
Basophils Absolute: 0 10*3/uL (ref 0.0–0.1)
Basophils Relative: 1 %
Eosinophils Absolute: 0.2 10*3/uL (ref 0.0–0.5)
Eosinophils Relative: 6 %
HCT: 37.1 % (ref 36.0–46.0)
Hemoglobin: 11.6 g/dL — ABNORMAL LOW (ref 12.0–15.0)
Immature Granulocytes: 0 %
Lymphocytes Relative: 39 %
Lymphs Abs: 1.5 10*3/uL (ref 0.7–4.0)
MCH: 26.7 pg (ref 26.0–34.0)
MCHC: 31.3 g/dL (ref 30.0–36.0)
MCV: 85.3 fL (ref 80.0–100.0)
Monocytes Absolute: 0.3 10*3/uL (ref 0.1–1.0)
Monocytes Relative: 8 %
Neutro Abs: 1.7 10*3/uL (ref 1.7–7.7)
Neutrophils Relative %: 46 %
Platelets: 187 10*3/uL (ref 150–400)
RBC: 4.35 MIL/uL (ref 3.87–5.11)
RDW: 17 % — ABNORMAL HIGH (ref 11.5–15.5)
WBC: 3.8 10*3/uL — ABNORMAL LOW (ref 4.0–10.5)
nRBC: 0 % (ref 0.0–0.2)

## 2022-01-27 LAB — IRON AND IRON BINDING CAPACITY (CC-WL,HP ONLY)
Iron: 47 ug/dL (ref 28–170)
Saturation Ratios: 15 % (ref 10.4–31.8)
TIBC: 308 ug/dL (ref 250–450)
UIBC: 261 ug/dL (ref 148–442)

## 2022-01-27 LAB — FERRITIN: Ferritin: 202 ng/mL (ref 11–307)

## 2022-01-27 LAB — SAMPLE TO BLOOD BANK

## 2022-01-27 NOTE — Progress Notes (Signed)
Patient is here for Aranesp injection. Her Hgb is 11.6 therefore she does not meet the parameters to receive this injection today. Labs printed and patient verbalized understanding.

## 2022-01-27 NOTE — Telephone Encounter (Signed)
Called per Dr. Alvy Bimler, she suggest we cancel her appt dated 10/19. I will see her as scheduled next month. if she agrees, pls proceed to cancel her appt on 10/1. She agrees to canceling 10/19 and is aware of next appts.

## 2022-02-09 ENCOUNTER — Encounter: Payer: Self-pay | Admitting: Internal Medicine

## 2022-02-09 NOTE — Progress Notes (Signed)
[facility] 

## 2022-02-10 ENCOUNTER — Encounter: Payer: Self-pay | Admitting: Obstetrics & Gynecology

## 2022-02-10 ENCOUNTER — Inpatient Hospital Stay: Payer: Medicare Other

## 2022-02-10 ENCOUNTER — Ambulatory Visit: Payer: Medicare Other | Admitting: Physician Assistant

## 2022-02-10 ENCOUNTER — Ambulatory Visit (INDEPENDENT_AMBULATORY_CARE_PROVIDER_SITE_OTHER): Payer: Medicare Other | Admitting: Obstetrics & Gynecology

## 2022-02-10 ENCOUNTER — Ambulatory Visit (INDEPENDENT_AMBULATORY_CARE_PROVIDER_SITE_OTHER): Payer: Medicare Other

## 2022-02-10 VITALS — BP 120/78 | HR 55

## 2022-02-10 DIAGNOSIS — Z4689 Encounter for fitting and adjustment of other specified devices: Secondary | ICD-10-CM | POA: Diagnosis not present

## 2022-02-10 DIAGNOSIS — N950 Postmenopausal bleeding: Secondary | ICD-10-CM | POA: Diagnosis not present

## 2022-02-10 NOTE — Progress Notes (Signed)
    Belinda Harper [birth year] [id number]        81 y.o.  [id number]   RP: PMB for Pelvic US and possible pessary fitting  HPI: Had hemorrhoid surgery about 1 month ago associated with rectal bleeding.  3 weeks ago, had spotting for 1 week which was from the vagina.  No current vaginal bleeding.  Milex ring #5 works well otherwise.  Postmenopause on no HRT.  No pelvic pain.  No vaginal discharge.     OB History  Gravida Para Term Preterm AB Living  '3 2 2   1 2  '$ SAB IAB Ectopic Multiple Live Births  1            # Outcome Date GA Lbr Len/2nd Weight Sex Delivery Anes PTL Lv  3 SAB           2 Term           1 Term             Past medical history,surgical history, problem list, medications, allergies, family history and social history were all reviewed and documented in the EPIC chart.   Directed ROS with pertinent positives and negatives documented in the history of present illness/assessment and plan.  Exam:  There were no vitals filed for this visit. General appearance:  Normal  Gynecologic exam: Milex ring #5 with support removed for the Pelvic US.  Pelvic US today: T/V images.  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  The uterus is measured at 4.19 cm x 3.58 cm x 1.94 cm.  The endometrial lining is thin and symmetrical measured at 2.3 mm in the right horn and at 3.1 mm in the left horn.  No mass or thickening or abnormal blood flow is seen.  Both ovaries are identified with a small atrophic appearance.  No adnexal mass.  No free fluid in the pelvis.  Note: Postvoid bladder volume was 148 mL.  Pessary fitting: The Milex ring pessary #5 with support was removed from the vagina.  A #4 ring with support was inserted instead.  After squatting and pushing the pessary remained in good position.  Patient felt comfortable.  I therefore remove the trial pessary and replaced it by the same size pessary purchased by the patient.   Assessment/Plan:  81 y.o. [id number]   1.  Postmenopausal bleeding  Had hemorrhoid surgery about 1 month ago associated with rectal bleeding.  3 weeks ago, had spotting for 1 week which was from the vagina.  No current vaginal bleeding.  Milex ring #5 works well otherwise.  Postmenopause on no HRT.  No pelvic pain.  No vaginal discharge. Pelvic US findings thoroughly reviewed with patient.  Patient reassured that the endometrial lines (bicornuate) are thin and symmetrical without abnormal blood flow.  Normal ovaries.  The vaginal bleeding was therefore more likely coming from the irritation caused by the pessary.    2. Encounter for fitting and adjustment of pessary  Pessary fitting to a smaller pessary which will hopefully not irritate the vagina as much.  Well with Milex ring #4 with support.  F/U in 4-5 months for pessary maintenance.  [name] MD, 2:35 PM 02/10/2022

## 2022-02-12 ENCOUNTER — Encounter: Payer: Self-pay | Admitting: Obstetrics & Gynecology

## 2022-02-14 ENCOUNTER — Encounter: Payer: Self-pay | Admitting: Physician Assistant

## 2022-02-14 ENCOUNTER — Ambulatory Visit: Payer: Medicare Other | Attending: Physician Assistant | Admitting: Physician Assistant

## 2022-02-14 VITALS — BP 140/58 | HR 60 | Ht 66.0 in | Wt 156.0 lb

## 2022-02-14 DIAGNOSIS — R002 Palpitations: Secondary | ICD-10-CM

## 2022-02-14 DIAGNOSIS — I10 Essential (primary) hypertension: Secondary | ICD-10-CM

## 2022-02-14 NOTE — Progress Notes (Unsigned)
Cardiology Office Note:    Date:  02/16/2022   ID:  Belinda Harper, DOB 1940-06-02, MRN [id number]  PCP:  Burnis Medin, MD   [facility] Cardiologist:  Pixie Casino, MD     Referring MD: Burnis Medin, MD   Chief Complaint  Patient presents with   Follow-up    12 months.    History of Present Illness:    Belinda Harper is a 81 y.o. female with a hx of hypertension and hyperlipidemia.  She was remotely seen by Dr. Stanford Breed in 2014 for dyspnea.  Myoview at the time was negative with EF of 68%.  She has a family history of coronary artery disease with her father having 5 vessel CABG. Patient was seen by Dr. Debara Pickett in November 2021 with intermittent palpitation with bradycardia in the 30s.  There was no evidence of bradycardia on the EKG.  She was not on any AV nodal blocking agent.  A 2-week heart monitor was recommended.  Heart monitor showed multiple SVT episodes which are likely the cause of her palpitation.  Slowest heart rate was in the upper 40s and this occurred at night.  Therefore Dr. Debara Pickett recommended at 12.5 mg daily of Toprol-XL at night to suppress her palpitation.  I last saw the patient in January 2022 at which time she was doing well.  Patient was last seen by Dr. Debara Pickett in June 2022 at which time she has not had any palpitation on the metoprolol.  She has been followed by Dr. Alvy Bimler of hematology service for anemia.  According to oncology/hematology note, her anemia is multifactorial with a component of bone marrow failure, probably chronic kidney disease and history of GI bleed.  She has chronic intermittent leukopenia since 2007.  She also has postmenopausal vaginal bleeding and has been followed by gynecology service.  Patient presents today for follow-up.  She denies any recent chest pain or worsening dyspnea.  She does have some baseline dyspnea on exertion with more strenuous activity but not with every day activity.  Her blood pressure is a tad  high today, I rechecked it myself and it was 134/80.  If she does to have dyspnea with exertion in the future, she should have hemoglobin checked first to make sure she is not anemic, if no significant anemia to explain her symptoms, then she should contact cardiology service and we will consider echocardiogram and additional study.  Past Medical History:  Diagnosis Date   Allergic rhinitis    Anxiety    Bradycardia    Chronic constipation    Chronic gastritis    followed by dr Silverio Decamp (GI);  long hx erosive gastritis with bleed   Depression    Family history of adverse reaction to anesthesia    pt's mother as she gets older organs shut down    GERD (gastroesophageal reflux disease)    Heart palpitations    cardiologist-- dr Debara Pickett;   palpitations w/ intermittant bradycardia;  event monitor 04-01-2020 multiple SVT episodes   Hemorrhoids    s/p banding 05-07-2021 by dr Silverio Decamp   History of asthma    childhood   History of sepsis 02/05/2015   sepsis secondart to cellulitis , left leg/ ankle due to retained ankle hardware   Hyperlipidemia    Hypertension    followed by pcp and cardiology;   normal nuclear stress test 09-28-2012   Iron deficiency anemia secondary to blood loss (chronic)    oncologist--- dr Alvy Bimler;  long hx anemia secondary gi blood loss;  treated with transfusion's and IV iron   Leukopenia    chronic intermittantly --- followed by dr Alvy Bimler   OA (osteoarthritis)    Osteoporosis    Presence of pessary    PSVT (paroxysmal supraventricular tachycardia)    Rectal bleeding    Uterine prolapse    per pt uses pessary   Wears glasses     Past Surgical History:  Procedure Laterality Date   CATARACT EXTRACTION W/ INTRAOCULAR LENS IMPLANT Bilateral    right 2000;  left 2020   COLONOSCOPY WITH ESOPHAGOGASTRODUODENOSCOPY (EGD)  12/15/2020   by dr Silverio Decamp   HARDWARE REMOVAL Left 02/21/2015   Procedure: IRRIGATION AND DEBRIDEMENT HARDWARE REMOVAL LEFT ANKLE ;  Surgeon:  Gaynelle Arabian, MD;  Location: [facility];  Service: Orthopedics;  Laterality: Left;   MASS EXCISION Left 09/11/2012   Procedure: 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   Calcium Citrate-Vitamin D (CITRACAL + D PO) Take 2 tablets by mouth in the morning and at bedtime. 800 mg calcium   DARBEPOETIN ALFA IJ Inject as directed as needed.   denosumab (PROLIA) 60 MG/ML SOSY injection Inject 60 mg into the skin every 6 (six) months.   docusate sodium (COLACE) 100 MG capsule Take 100 mg by mouth every morning.   Magnesium Glycinate 665 MG CAPS Take 665 mg by mouth daily.   Menaquinone-7 (K2 PO) Take by mouth.   metoprolol succinate (TOPROL-XL) 25 MG 24 hr tablet TAKE 1/2 TABLET (12.5 MG TOTAL) BY MOUTH AT BEDTIME (Patient taking differently: Take 12.5 mg by mouth daily with supper.)   Wheat Dextrin (BENEFIBER DRINK MIX PO) Take 1 Scoop by mouth in the morning and at bedtime.     Allergies:   Sulfamethoxazole, Nsaids, Sulfa antibiotics, and Yellow jacket venom   Social History   Socioeconomic History   Marital status: Divorced    Spouse name: Not on file   Number of children: 2   Years of education: Not on file   Highest education level: Not on file  Occupational History   Occupation: retired  Tobacco Use   Smoking status: Never   Smokeless tobacco: Never  Vaping  Use   Vaping Use: Never used  Substance and Sexual Activity   Alcohol use: Not Currently   Drug use: No   Sexual activity: Not Currently    Birth control/protection: Post-menopausal    Comment: 1st intercourse- 18, partners- 1   Other Topics Concern   Not on file  Social History Narrative   HH of  1   To rent out room    Retired Dispensing optician   Never smoked    Pet cat allergic    Divorced   Regular exericise  walking mowing the lawnswimming   Silver sneakers    Social Determinants of Health   Financial Resource Strain:  Low Risk  (02/23/2021)   Overall Financial Resource Strain (CARDIA)    Difficulty of Paying Living Expenses: Not hard at all  Food Insecurity: No Food Insecurity (02/23/2021)   Hunger Vital Sign    Worried About Running Out of Food in the Last Year: Never true    Ran Out of Food in the Last Year: Never true  Transportation Needs: No Transportation Needs (02/23/2021)   PRAPARE - Hydrologist (Medical): No    Lack of Transportation (Non-Medical): No  Physical Activity: Inactive (02/23/2021)   Exercise Vital Sign    Days of Exercise per Week: 0 days    Minutes of Exercise per Session: 0 min  Stress: No Stress Concern Present (02/23/2021)   [place]    Feeling of Stress : Not at all  Social Connections: Moderately Integrated (02/23/2021)   Social Connection and Isolation Panel [NHANES]    Frequency of Communication with Friends and Family: More than three times a week    Frequency of Social Gatherings with Friends and Family: More than three times a week    Attends Religious Services: More than 4 times per year    Active Member of [employer] or Organizations: Yes    Attends Archivist Meetings: 1 to 4 times per year    Marital Status: Divorced     Family History: The patient's family history includes Arthritis in her father and mother; Heart disease in her  father and mother; Hyperlipidemia in her mother; Melanoma in her father; Osteoporosis in her mother; Ovarian cancer in her mother; Uterine cancer in her mother.  ROS:   Please see the history of present illness.     All other systems reviewed and are negative.  EKGs/Labs/Other Studies Reviewed:    The following studies were reviewed today:  Echo 02/09/2015 LV EF: 60% -   65%   -------------------------------------------------------------------  Indications:      Dyspnea 786.09.   -------------------------------------------------------------------  History:   PMH:  Cellulitis.  Dyspnea.  Angina pectoris.   -------------------------------------------------------------------  Study Conclusions   - Left ventricle: The cavity size was normal. There was mild    concentric hypertrophy. Systolic function was normal. The    estimated ejection fraction was in the range of 60% to 65%. Wall    motion was normal; there were no regional wall motion    abnormalities.  - Aortic valve: Trileaflet; normal thickness, mildly calcified    leaflets.  - Mitral valve: There was mild regurgitation.  - Tricuspid valve: There was mild regurgitation.   EKG:  EKG is not ordered today.  Recent Labs: 11/18/2021: BUN 15; Creatinine 0.92; Potassium 4.0; Sodium 141 01/27/2022: Hemoglobin 11.6; Platelets 187  Recent Lipid Panel    Component Value Date/Time   CHOL 227 (H) 12/09/2020 1443   TRIG 118.0 12/09/2020 1443   TRIG 64 03/24/2006 0820   HDL 79.00 12/09/2020 1443   CHOLHDL 3 12/09/2020 1443   VLDL 23.6 12/09/2020 1443   LDLCALC 124 (H) 12/09/2020 1443   LDLCALC 133 (H) 12/06/2019 1147   LDLDIRECT 141.9 08/06/2012 1041     Risk Assessment/Calculations:           Physical Exam:    VS:  BP (!) 140/58 (BP Location: Left Arm, Patient Position: Sitting, Cuff Size: Normal)   Pulse 60   Ht '5\' 6"'$  (1.676 m)   Wt 156 lb (70.8 kg)   BMI 25.18 kg/m  Wt Readings from Last 3 Encounters:   02/14/22 156 lb (70.8 kg)  12/13/21 150 lb (68 kg)  11/18/21 151 lb (68.5 kg)     GEN:  Well nourished, well developed in no acute distress HEENT: Normal NECK: No JVD; No carotid bruits LYMPHATICS: No lymphadenopathy CARDIAC: RRR, no murmurs, rubs, gallops RESPIRATORY:  Clear to auscultation without rales, wheezing or rhonchi  ABDOMEN: Soft, non-tender, non-distended MUSCULOSKELETAL:  No edema; No deformity  SKIN: Warm and dry NEUROLOGIC:  Alert and oriented x 3 PSYCHIATRIC:  Normal affect   ASSESSMENT:    1. Palpitations   2. Essential hypertension   3. Hyperlipidemia LDL goal <100    PLAN:    In order of problems listed above:  Palpitation: Previous heart monitor showed occasional SVT.  Ever since she was started on metoprolol succinate 12.5 mg daily, her symptom has been well controlled.  We will continue on current therapy  Hypertension: Blood pressure stable            Medication Adjustments/Labs and Tests Ordered: Current medicines are reviewed at length with the patient today.  Concerns regarding medicines are outlined above.  No orders of the defined types were placed in this encounter.  No orders of the defined types were placed in this encounter.   Patient Instructions  Medication Instructions:  Your physician recommends that you continue on your current medications as directed. Please refer to the Current Medication list given to you today.  *If you need a refill on your cardiac medications before your next appointment, please call your pharmacy*  Lab Work: NONE ordered at this time of appointment   If you have labs (blood work) drawn today and your tests are completely normal, you will receive your results only by: [place] (if you have MyChart) OR A paper copy in the mail If you have any lab test that is abnormal or we need to change your treatment, we will call you to review the results.  Testing/Procedures: NONE ordered at this time  of appointment   Follow-Up: At [facility], you and your health needs are our priority.  As part of our continuing mission to provide you with exceptional heart care, we have created designated Provider Care Teams.  These Care Teams include your primary Cardiologist (physician) and Advanced Practice Providers (APPs -  Physician Assistants and Nurse Practitioners) who all work together to provide you with the care you need, when you need it.  Your next appointment:   1 year(s)  The format for your next appointment:   In Person  Provider:   Pixie Casino, MD     Other Instructions  Important Information About Sugar         Hilbert Corrigan, [place]  02/16/2022 11:19 PM    [place]

## 2022-02-14 NOTE — Patient Instructions (Addendum)
Medication Instructions:  Your physician recommends that you continue on your current medications as directed. Please refer to the Current Medication list given to you today.  *If you need a refill on your cardiac medications before your next appointment, please call your pharmacy*  Lab Work: NONE ordered at this time of appointment   If you have labs (blood work) drawn today and your tests are completely normal, you will receive your results only by: MyChart Message (if you have MyChart) OR A paper copy in the mail If you have any lab test that is abnormal or we need to change your treatment, we will call you to review the results.  Testing/Procedures: NONE ordered at this time of appointment   Follow-Up: At [facility], you and your health needs are our priority.  As part of our continuing mission to provide you with exceptional heart care, we have created designated Provider Care Teams.  These Care Teams include your primary Cardiologist (physician) and Advanced Practice Providers (APPs -  Physician Assistants and Nurse Practitioners) who all work together to provide you with the care you need, when you need it.  Your next appointment:   1 year(s)  The format for your next appointment:   In Person  Provider:   Kenneth C Hilty, MD     Other Instructions   Important Information About Sugar       

## 2022-02-15 ENCOUNTER — Encounter: Payer: Medicare Other | Admitting: Internal Medicine

## 2022-02-16 ENCOUNTER — Encounter: Payer: Self-pay | Admitting: Physician Assistant

## 2022-02-24 ENCOUNTER — Inpatient Hospital Stay: Payer: Medicare Other | Attending: Hematology and Oncology | Admitting: Hematology and Oncology

## 2022-02-24 ENCOUNTER — Inpatient Hospital Stay: Payer: Medicare Other

## 2022-02-24 ENCOUNTER — Encounter: Payer: Self-pay | Admitting: Hematology and Oncology

## 2022-02-24 VITALS — BP 152/54 | HR 64 | Temp 98.0°F | Resp 18 | Ht 66.0 in | Wt 154.2 lb

## 2022-02-24 DIAGNOSIS — D539 Nutritional anemia, unspecified: Secondary | ICD-10-CM

## 2022-02-24 DIAGNOSIS — Z79899 Other long term (current) drug therapy: Secondary | ICD-10-CM | POA: Diagnosis not present

## 2022-02-24 DIAGNOSIS — D509 Iron deficiency anemia, unspecified: Secondary | ICD-10-CM

## 2022-02-24 DIAGNOSIS — D500 Iron deficiency anemia secondary to blood loss (chronic): Secondary | ICD-10-CM | POA: Insufficient documentation

## 2022-02-24 LAB — CBC WITH DIFFERENTIAL/PLATELET
Abs Immature Granulocytes: 0.01 10*3/uL (ref 0.00–0.07)
Basophils Absolute: 0 10*3/uL (ref 0.0–0.1)
Basophils Relative: 1 %
Eosinophils Absolute: 0.2 10*3/uL (ref 0.0–0.5)
Eosinophils Relative: 6 %
HCT: 36.7 % (ref 36.0–46.0)
Hemoglobin: 11.3 g/dL — ABNORMAL LOW (ref 12.0–15.0)
Immature Granulocytes: 0 %
Lymphocytes Relative: 41 %
Lymphs Abs: 1.4 10*3/uL (ref 0.7–4.0)
MCH: 26.6 pg (ref 26.0–34.0)
MCHC: 30.8 g/dL (ref 30.0–36.0)
MCV: 86.4 fL (ref 80.0–100.0)
Monocytes Absolute: 0.3 10*3/uL (ref 0.1–1.0)
Monocytes Relative: 9 %
Neutro Abs: 1.5 10*3/uL — ABNORMAL LOW (ref 1.7–7.7)
Neutrophils Relative %: 43 %
Platelets: 201 10*3/uL (ref 150–400)
RBC: 4.25 MIL/uL (ref 3.87–5.11)
RDW: 18.1 % — ABNORMAL HIGH (ref 11.5–15.5)
WBC: 3.5 10*3/uL — ABNORMAL LOW (ref 4.0–10.5)
nRBC: 0 % (ref 0.0–0.2)

## 2022-02-24 LAB — SAMPLE TO BLOOD BANK

## 2022-02-24 NOTE — Progress Notes (Signed)
[name] OFFICE PROGRESS NOTE  Panosh, Standley Brooking, MD  ASSESSMENT & PLAN:  Deficiency anemia The patient has multifactorial anemia, combination of iron deficiency due to chronic GI bleed as well as bone marrow disorder She has no further GI bleed recently and her last iron studies was adequate She has not needed further darbepoetin injection for the past few months I will cancel her treatment today I will continue to monitor her iron studies and we will see her back in 3 months If her hemoglobin dropped to less than 10, she will get a dose of darbepoetin injection  Orders Placed This Encounter  Procedures   Iron and Iron Binding Capacity ([facility] only)    Standing Status:   Future    Standing Expiration Date:   02/25/2023   Ferritin    Standing Status:   Future    Standing Expiration Date:   02/24/2023    The total time spent in the appointment was 20 minutes encounter with patients including review of chart and various tests results, discussions about plan of care and coordination of care plan   All questions were answered. The patient knows to call the clinic with any problems, questions or concerns. No barriers to learning was detected.    Belinda Lark, MD 11/2/202311:00 AM  INTERVAL HISTORY: Belinda Harper 81 y.o. female returns for further follow-up for multifactorial anemia She is doing well and have excellent energy level She has no further rectal bleeding recently  SUMMARY OF HEMATOLOGIC HISTORY:  Belinda Harper is seen because of recurrent pancytopenia I have not seen her since 2015 She was transferred to my care after her prior physician has left I reviewed the patient's records extensive and collaborated the history with the patient. Summary of her history is as follows: This is a pleasant woman with multifactorial anemia and associated leukopenia. She has a clear element of iron malabsorption with a superimposed normochromic anemia and chronic  leukopenia. The chronic anemia and leukopenia go back as far as we have records (2007) when white counts ran as low as 2700. She has a normal differential. White count average is 3000 and has not changed. Platelet count is normal. Best hemoglobin she achieves with parenteral iron replacement is 11 g. She had a nondiagnostic bone marrow biopsy done in 2006. She had received intravenous iron infusion in the past. Her last colonoscopy in May 2013 showed severe diverticulosis with internal hemorrhoids. She feels well. Denies recent infection. She denies symptoms of fatigue. She tolerated iron supplement well. She had periodic hemorrhoidal bleeding. She was found to have abnormal CBC from intermittently over the years by her primary care doctor She has started taking oral iron supplements since November of last year Most recently, she have noted passage of dark stool She attempted to increase her oral iron supplement but still complain of excessive fatigue Recently, her repeat CBC has dropped from 11-8.2 and hence she was referred back here for further evaluation On 12/15/20, she had repeat EGD and colonoscopy which showed: - Severe diverticulosis in the sigmoid colon, in the descending colon, in the transverse colon and in the ascending colon. - Non-bleeding external and internal hemorrhoids. - No specimens collected.  She denies recent chest pain on exertion, shortness of breath on minimal exertion, pre-syncopal episodes, or palpitations.  The patient denies over the counter NSAID ingestion. She is not on antiplatelets agents. Her last colonoscopy was in 2013 She had no prior history or diagnosis of cancer.  Her age appropriate screening programs are up-to-date. She denies any pica and eats a variety of diet.  The patient was prescribed oral iron supplements and she takes 1 dose of oral iron supplement at night to avoid constipation From Sept 2021 till present, she has received many doses of  intravenous iron infusion On December 15, 2020, she had repeat upper endoscopy evaluation due to severe anemia which showed LA Grade B reflux esophagitis with no bleeding. 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 - Gastritis. Biopsied. - Normal examined duodenum.  Since Monoferric IV iron in October 2022, her iron deficiency resolved On May 28, 2021, she started on Aranesp injection  On 06/18/21, she had repeat GI procedure PROCEDURE NOTE: The patient presents with symptomatic grade II-III  hemorrhoids, requesting rubber band ligation of his/her hemorrhoidal disease.  All risks, benefits and alternative forms of therapy were described and informed consent was obtained.   In the Left Lateral Decubitus position anoscopic examination revealed grade II-III hemorrhoids in the left lateral and right posterior position(s).  The anorectum was pre-medicated with 0.125% Nitroglycerine and Recticare The decision was made to band the left lateral internal hemorrhoid, and the [place] was used to perform band ligation without complication.  Digital anorectal examination was then performed to assure proper positioning of the band, and to adjust the banded tissue as required. The patient was discharged home without pain or other issues.  Dietary and behavioral recommendations were given and along with follow-up instructions.    In March 2023, she received both blood transfusion and intravenous iron infusion On October 15, 2021, she underwent ligation and resection of hemorrhoid On 11/09/2011, she received another unit of blood due to severe rectal bleeding On 11/29/2021, she received 1 dose of darbepoetin injection   I have reviewed the past medical history, past surgical history, social history and family history with the patient and they are unchanged from previous note.  ALLERGIES:  is allergic to sulfamethoxazole, nsaids, sulfa  antibiotics, and yellow jacket venom.  MEDICATIONS:  Current Outpatient Medications  Medication Sig Dispense Refill   amLODipine (NORVASC) 5 MG tablet TAKE 1 TABLET BY MOUTH DAILY. 90 tablet 3   Calcium Citrate-Vitamin D (CITRACAL + D PO) Take 2 tablets by mouth in the morning and at bedtime. 800 mg calcium     DARBEPOETIN ALFA IJ Inject as directed as needed.     denosumab (PROLIA) 60 MG/ML SOSY injection Inject 60 mg into the skin every 6 (six) months.     docusate sodium (COLACE) 100 MG capsule Take 100 mg by mouth every morning.     Magnesium Glycinate 665 MG CAPS Take 665 mg by mouth daily.     Menaquinone-7 (K2 PO) Take by mouth.     metoprolol succinate (TOPROL-XL) 25 MG 24 hr tablet TAKE 1/2 TABLET (12.5 MG TOTAL) BY MOUTH AT BEDTIME (Patient taking differently: Take 12.5 mg by mouth daily with supper.) 45 tablet 1   Wheat Dextrin (BENEFIBER DRINK MIX PO) Take 1 Scoop by mouth in the morning and at bedtime.     No current facility-administered medications for this visit.     REVIEW OF SYSTEMS:   Constitutional: Denies fevers, chills or night sweats Eyes: Denies blurriness of vision Ears, nose, mouth, throat, and face: Denies mucositis or sore throat Respiratory: Denies cough, dyspnea or wheezes Cardiovascular: Denies palpitation, chest discomfort or lower extremity swelling Gastrointestinal:  Denies nausea, heartburn or change in bowel habits  Skin: Denies abnormal skin rashes Lymphatics: Denies new lymphadenopathy or easy bruising Neurological:Denies numbness, tingling or new weaknesses Behavioral/Psych: Mood is stable, no new changes  All other systems were reviewed with the patient and are negative.  PHYSICAL EXAMINATION: ECOG PERFORMANCE STATUS: 0 - Asymptomatic  Vitals:   02/24/22 0921  BP: (!) 152/54  Pulse: 64  Resp: 18  Temp: 98 F (36.7 C)  SpO2: 100%   Filed Weights   02/24/22 0921  Weight: 154 lb 3.2 oz (69.9 kg)    GENERAL:alert, no distress and  comfortable  NEURO: alert & oriented x 3 with fluent speech, no focal motor/sensory deficits  LABORATORY DATA:  I have reviewed the data as listed     Component Value Date/Time   NA 141 11/18/2021 0932   NA 142 05/28/2012 1600   K 4.0 11/18/2021 0932   K 3.7 05/28/2012 1600   CL 105 11/18/2021 0932   CL 103 05/28/2012 1600   CO2 32 11/18/2021 0932   CO2 29 05/28/2012 1600   GLUCOSE 73 11/18/2021 0932   GLUCOSE 99 05/28/2012 1600   BUN 15 11/18/2021 0932   BUN 19.3 05/28/2012 1600   CREATININE 0.92 11/18/2021 0932   CREATININE 0.88 12/06/2019 1147   CREATININE 0.8 05/28/2012 1600   CALCIUM 9.3 11/18/2021 0932   CALCIUM 9.5 05/28/2012 1600   PROT 7.8 12/09/2020 1443   PROT 7.3 05/28/2012 1600   ALBUMIN 4.4 12/09/2020 1443   ALBUMIN 3.5 05/28/2012 1600   AST 25 12/09/2020 1443   AST 23 05/28/2012 1600   ALT 15 12/09/2020 1443   ALT 13 05/28/2012 1600   ALKPHOS 79 12/09/2020 1443   ALKPHOS 82 05/28/2012 1600   BILITOT 0.3 12/09/2020 1443   BILITOT <0.20 05/28/2012 1600   GFRNONAA >60 11/18/2021 0932   GFRAA >60 04/23/2018 1634    No results found for: "SPEP", "UPEP"  Lab Results  Component Value Date   WBC 3.5 (L) 02/24/2022   NEUTROABS 1.5 (L) 02/24/2022   HGB 11.3 (L) 02/24/2022   HCT 36.7 02/24/2022   MCV 86.4 02/24/2022   PLT 201 02/24/2022      Chemistry      Component Value Date/Time   NA 141 11/18/2021 0932   NA 142 05/28/2012 1600   K 4.0 11/18/2021 0932   K 3.7 05/28/2012 1600   CL 105 11/18/2021 0932   CL 103 05/28/2012 1600   CO2 32 11/18/2021 0932   CO2 29 05/28/2012 1600   BUN 15 11/18/2021 0932   BUN 19.3 05/28/2012 1600   CREATININE 0.92 11/18/2021 0932   CREATININE 0.88 12/06/2019 1147   CREATININE 0.8 05/28/2012 1600      Component Value Date/Time   CALCIUM 9.3 11/18/2021 0932   CALCIUM 9.5 05/28/2012 1600   ALKPHOS 79 12/09/2020 1443   ALKPHOS 82 05/28/2012 1600   AST 25 12/09/2020 1443   AST 23 05/28/2012 1600   ALT 15  12/09/2020 1443   ALT 13 05/28/2012 1600   BILITOT 0.3 12/09/2020 1443   BILITOT <0.20 05/28/2012 1600

## 2022-02-24 NOTE — Assessment & Plan Note (Signed)
The patient has multifactorial anemia, combination of iron deficiency due to chronic GI bleed as well as bone marrow disorder She has no further GI bleed recently and her last iron studies was adequate She has not needed further darbepoetin injection for the past few months I will cancel her treatment today I will continue to monitor her iron studies and we will see her back in 3 months If her hemoglobin dropped to less than 10, she will get a dose of darbepoetin injection

## 2022-03-02 ENCOUNTER — Ambulatory Visit (INDEPENDENT_AMBULATORY_CARE_PROVIDER_SITE_OTHER): Payer: Medicare Other | Admitting: Internal Medicine

## 2022-03-02 ENCOUNTER — Encounter: Payer: Self-pay | Admitting: Internal Medicine

## 2022-03-02 ENCOUNTER — Telehealth: Payer: Self-pay | Admitting: Internal Medicine

## 2022-03-02 VITALS — BP 116/76 | HR 56 | Temp 98.0°F | Ht 66.0 in | Wt 154.6 lb

## 2022-03-02 DIAGNOSIS — M810 Age-related osteoporosis without current pathological fracture: Secondary | ICD-10-CM

## 2022-03-02 DIAGNOSIS — D649 Anemia, unspecified: Secondary | ICD-10-CM | POA: Diagnosis not present

## 2022-03-02 DIAGNOSIS — I10 Essential (primary) hypertension: Secondary | ICD-10-CM | POA: Diagnosis not present

## 2022-03-02 DIAGNOSIS — Z0000 Encounter for general adult medical examination without abnormal findings: Secondary | ICD-10-CM

## 2022-03-02 DIAGNOSIS — E785 Hyperlipidemia, unspecified: Secondary | ICD-10-CM | POA: Diagnosis not present

## 2022-03-02 DIAGNOSIS — Z79899 Other long term (current) drug therapy: Secondary | ICD-10-CM

## 2022-03-02 LAB — LIPID PANEL
Cholesterol: 251 mg/dL — ABNORMAL HIGH (ref 0–200)
HDL: 69.7 mg/dL (ref >39.00)
LDL Cholesterol: 151 mg/dL — ABNORMAL HIGH (ref 0–99)
NonHDL: 181.07
Total CHOL/HDL Ratio: 4
Triglycerides: 150 mg/dL — ABNORMAL HIGH (ref 0.0–149.0)
VLDL: 30 mg/dL (ref 0.0–40.0)

## 2022-03-02 NOTE — Progress Notes (Signed)
Cholesterol up  from last year . Continue attention to health diet   and activity  \

## 2022-03-02 NOTE — Patient Instructions (Signed)
Good to see you today . Bone scan is stable or inproved.  Checking cholesterol panel today . BP is in range  If all ok  yearly visit .  Use pill organizer always.

## 2022-03-02 NOTE — Telephone Encounter (Signed)
Pt dropped off med list and wanted to let the nurse know to update it with the correct info. Med list was placed in Dr folder.   Please advise

## 2022-03-02 NOTE — Progress Notes (Signed)
Chief Complaint  Patient presents with   Annual Exam    Patient want to discuss with Dr. Regis Bill about her bone density that was done this year. Also skin spots .     HPI: Patient  Belinda Harper  81 y.o. comes in today for Preventive Health Care visit  BP:  amlodipine    also metoprolol  Anemia under heme care Dr Alvy Bimler currently years of sig anemia irone def hx of various low bleeds  rx with iorn transfusion  not q 3 months  last hg 11.3  CV: yearly Didn't get eval for dexa results  Doing pretty well now   Health Maintenance  Topic Date Due   TETANUS/TDAP  06/23/2019   COVID-19 Vaccine (3 - Pfizer risk series) 04/01/2020   INFLUENZA VACCINE  11/23/2021   Medicare Annual Wellness (AWV)  02/23/2022   Pneumonia Vaccine 10+ Years old  Completed   DEXA SCAN  Completed   Zoster Vaccines- Shingrix  Completed   HPV VACCINES  Aged Out   Health Maintenance Review LIFESTYLE:  Exercise:   yardwork and ok  riding mower . Tobacco/ETS: no Alcohol:  no Sugar beverages: ocass Sleep:5-6  +  Drug use: no HH of  3 grandaughter in garage   3 animals    ROS:  GREST of 12 system review negative except as per HPI no cp sob falling recently    Past Medical History:  Diagnosis Date   Allergic rhinitis    Anxiety    Bradycardia    Chronic constipation    Chronic gastritis    followed by dr Silverio Decamp (GI);  long hx erosive gastritis with bleed   Depression    Family history of adverse reaction to anesthesia    pt's mother as she gets older organs shut down    GERD (gastroesophageal reflux disease)    Heart palpitations    cardiologist-- dr Debara Pickett;   palpitations w/ intermittant bradycardia;  event monitor 04-01-2020 multiple SVT episodes   Hemorrhoids    s/p banding 05-07-2021 by dr Silverio Decamp   History of asthma    childhood   History of sepsis 02/05/2015   sepsis secondart to cellulitis , left leg/ ankle due to retained ankle hardware   Hyperlipidemia    Hypertension    followed  by pcp and cardiology;   normal nuclear stress test 09-28-2012   Iron deficiency anemia secondary to blood loss (chronic)    oncologist--- dr Alvy Bimler;  long hx anemia secondary gi blood loss;  treated with transfusion's and IV iron   Leukopenia    chronic intermittantly --- followed by dr Alvy Bimler   OA (osteoarthritis)    Osteoporosis    Presence of pessary    PSVT (paroxysmal supraventricular tachycardia)    Rectal bleeding    Uterine prolapse    per pt uses pessary   Wears glasses     Past Surgical History:  Procedure Laterality Date   CATARACT EXTRACTION W/ INTRAOCULAR LENS IMPLANT Bilateral    right 2000;  left 2020   COLONOSCOPY WITH ESOPHAGOGASTRODUODENOSCOPY (EGD)  12/15/2020   by dr Silverio Decamp   HARDWARE REMOVAL Left 02/21/2015   Procedure: IRRIGATION AND DEBRIDEMENT HARDWARE REMOVAL LEFT ANKLE ;  Surgeon: Gaynelle Arabian, MD;  Location: [facility];  Service: Orthopedics;  Laterality: Left;   MASS EXCISION Left 09/11/2012   Procedure: 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; Rotation Flap;  Surgeon: Wynonia Sours, MD;  Location: [facility];  Service: Orthopedics;  Laterality: Left;   ORIF ANKLE FRACTURE Left 03/21/2009   '@[facility]'$   by dr Wynelle Link;   fracture and dislocation   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 , saw injury   TRANSANAL HEMORRHOIDAL DEARTERIALIZATION N/A 10/15/2021   Procedure: TRANSANAL HEMORRHOIDAL DEARTERIALIZATION;  Surgeon: Leighton Ruff, MD;  Location: [facility];  Service: General;  Laterality: N/A;    Family History  Problem Relation Age of Onset   Hyperlipidemia Mother    Heart disease Mother        CABG age 97   Ovarian cancer Mother    Uterine cancer Mother    Arthritis Mother    Osteoporosis Mother    Heart disease Father        CABG age 67   Arthritis Father    Melanoma Father     Social History   Socioeconomic History   Marital status: Divorced    Spouse name: Not on file    Number of children: 2   Years of education: Not on file   Highest education level: Not on file  Occupational History   Occupation: retired  Tobacco Use   Smoking status: Never   Smokeless tobacco: Never  Vaping Use   Vaping Use: Never used  Substance and Sexual Activity   Alcohol use: Not Currently   Drug use: No   Sexual activity: Not Currently    Birth control/protection: Post-menopausal    Comment: 1st intercourse- 18, partners- 1   Other Topics Concern   Not on file  Social History Narrative   HH of  1   To rent out room    Retired Dispensing optician   Never smoked    Pet cat allergic    Divorced   Regular exericise  walking mowing the lawnswimming   Silver sneakers    Social Determinants of Health   Financial Resource Strain: Low Risk  (02/23/2021)   Overall Financial Resource Strain (CARDIA)    Difficulty of Paying Living Expenses: Not hard at all  Food Insecurity: No Food Insecurity (02/23/2021)   Hunger Vital Sign    Worried About Running Out of Food in the Last Year: Never true    [place] in the Last Year: Never true  Transportation Needs: No Transportation Needs (02/23/2021)   PRAPARE - Hydrologist (Medical): No    Lack of Transportation (Non-Medical): No  Physical Activity: Inactive (02/23/2021)   Exercise Vital Sign    Days of Exercise per Week: 0 days    Minutes of Exercise per Session: 0 min  Stress: No Stress Concern Present (02/23/2021)   [place]    Feeling of Stress : Not at all  Social Connections: Moderately Integrated (02/23/2021)   Social Connection and Isolation Panel [NHANES]    Frequency of Communication with Friends and Family: More than three times a week    Frequency of Social Gatherings with Friends and Family: More than three times a week    Attends Religious Services: More than 4 times per year    Active Member of [employer] or Organizations:  Yes    Attends Archivist Meetings: 1 to 4 times per year    Marital Status: Divorced    Outpatient Medications Prior to Visit  Medication Sig Dispense Refill   amLODipine (NORVASC) 5 MG tablet TAKE 1 TABLET BY MOUTH DAILY. 90 tablet 3   Calcium Citrate-Vitamin D (  CITRACAL + D PO) Take 2 tablets by mouth in the morning and at bedtime. Take 2 tablets po a.m and at lunch. 800 mg calcium     DARBEPOETIN ALFA IJ Inject as directed as needed.     denosumab (PROLIA) 60 MG/ML SOSY injection Inject 60 mg into the skin every 6 (six) months.     docusate sodium (COLACE) 100 MG capsule Take 100 mg by mouth every morning.     Magnesium Glycinate 665 MG CAPS Take 665 mg by mouth daily.     Menaquinone-7 (K2 PO) Take by mouth.     metoprolol succinate (TOPROL-XL) 25 MG 24 hr tablet TAKE 1/2 TABLET (12.5 MG TOTAL) BY MOUTH AT BEDTIME (Patient taking differently: Take 12.5 mg by mouth daily with supper.) 45 tablet 1   Wheat Dextrin (BENEFIBER DRINK MIX PO) Take 1 Scoop by mouth daily.     No facility-administered medications prior to visit.     EXAM:  BP 116/76 (BP Location: Right Arm, Patient Position: Sitting, Cuff Size: Normal)   Pulse (!) 56   Temp 98 F (36.7 C) (Oral)   Ht '5\' 6"'$  (1.676 m)   Wt 154 lb 9.6 oz (70.1 kg)   SpO2 95%   BMI 24.95 kg/m   Body mass index is 24.95 kg/m. Wt Readings from Last 3 Encounters:  03/02/22 154 lb 9.6 oz (70.1 kg)  02/24/22 154 lb 3.2 oz (69.9 kg)  02/14/22 156 lb (70.8 kg)    Physical Exam: Vital signs reviewed GEX:BMWU is a well-developed well-nourished alert cooperative    who appearsr stated age in no acute distress.  HEENT: normocephalic atraumatic , Eyes: PERRL EOM's full, conjunctiva clear, Nares: paten,t no deformity discharge or tenderness., Ears: no deformity EAC's clear TMs with normal landmarks. Mouth: clear OP, no lesions, edema.  Moist mucous membranes. Dentition in adequate repair. NECK: supple without masses, thyromegaly or  bruits. CHEST/PULM:  Clear to auscultation and percussion breath sounds equal no wheeze , rales or rhonchi.  Breast: normal by inspection . No dimpling, discharge, masses, tenderness or discharge . CV: PMI is nondisplaced, S1 S2 no gallops, murmurs, rubs. Peripheral pulses are present.No JVD .  ABDOMEN: Bowel sounds normal nontender  No guard or rebound, no hepato splenomegal no CVA tenderness. Extremtities:  No clubbing cyanosis or edema, no acute joint swelling or redness no focal atrophy NEURO:  Oriented x3, cranial nerves 3-12 appear to be intact, no obvious focal weakness,gait within normal limits SKIN: No acute rashes normal turgor, r, no bruising or petechiae. Spots  tan lesions  looks like freckling   PSYCH: Oriented, good eye contact, no obvious depression anxiety, cognition and judgment appear normal. LN: no cervical axillary inguinal adenopathy  Lab Results  Component Value Date   WBC 3.5 (L) 02/24/2022   HGB 11.3 (L) 02/24/2022   HCT 36.7 02/24/2022   PLT 201 02/24/2022   GLUCOSE 73 11/18/2021   CHOL 251 (H) 03/02/2022   TRIG 150.0 (H) 03/02/2022   HDL 69.70 03/02/2022   LDLDIRECT 141.9 08/06/2012   LDLCALC 151 (H) 03/02/2022   ALT 15 12/09/2020   AST 25 12/09/2020   NA 141 11/18/2021   K 4.0 11/18/2021   CL 105 11/18/2021   CREATININE 0.92 11/18/2021   BUN 15 11/18/2021   CO2 32 11/18/2021   TSH 1.19 12/09/2020   INR 1.04 04/10/2009   HGBA1C 5.6 11/21/2018    BP Readings from Last 3 Encounters:  03/02/22 116/76  02/24/22 (!) 152/54  02/14/22 [name])  140/58    Lab reviewed with patient  will update lipids  Dexa   stable and  increase of  bone density  on therapy  ASSESSMENT AND PLAN:  Discussed the following assessment and plan:    ICD-10-CM   1. Visit for preventive health examination  Z00.00     2. Age-related osteoporosis without current pathological fracture  M81.0    stable dexa  to improved    3. Symptomatic anemia  D64.9     4. Medication  management  Z79.899 Lipid panel    Lipid panel    5. Hypertension, unspecified type  I10 Lipid panel    Lipid panel   has been controlled    6. Hyperlipidemia, unspecified hyperlipidemia type  E78.5 Lipid panel    Lipid panel      Updated labs  Fu depending results . Can get tdap at pharmacy or hd Return in about 1 year (around 03/03/2023) for depending on results.  Patient Care Team: Rory Xiang, Standley Brooking, MD as PCP - General (Internal Medicine) Debara Pickett Nadean Corwin, MD as PCP - Cardiology (Cardiology) Clent Jacks, MD (Ophthalmology) Mikey Bussing, DDS (Dentistry) Druscilla Brownie, MD as Consulting Physician (Dermatology) [name], MD as Consulting Physician (Hematology and Oncology) Gaynelle Arabian, MD as Consulting Physician (Orthopedic Surgery) Patient Instructions  Good to see you today . Bone scan is stable or inproved.  Checking cholesterol panel today . BP is in range  If all ok  yearly visit .  Use pill organizer always.     Standley Brooking. Meng Winterton M.D.

## 2022-03-03 ENCOUNTER — Telehealth: Payer: Self-pay

## 2022-03-03 NOTE — Telephone Encounter (Signed)
Med list updated

## 2022-03-08 ENCOUNTER — Ambulatory Visit (INDEPENDENT_AMBULATORY_CARE_PROVIDER_SITE_OTHER): Payer: Medicare Other | Admitting: Family Medicine

## 2022-03-08 ENCOUNTER — Encounter: Payer: Self-pay | Admitting: Family Medicine

## 2022-03-08 VITALS — BP 116/76 | HR 56 | Ht 66.0 in | Wt 154.9 lb

## 2022-03-08 DIAGNOSIS — Z23 Encounter for immunization: Secondary | ICD-10-CM | POA: Diagnosis not present

## 2022-03-08 DIAGNOSIS — Z0000 Encounter for general adult medical examination without abnormal findings: Secondary | ICD-10-CM

## 2022-03-08 NOTE — Patient Instructions (Addendum)
CHECKLIST FOR A HEALTHY LIFE:  -Eat a healthy whole foods based diet, get regular physical activity, manage stress and engage in social connections - see below for specific suggestions and more information.  -Vaccines due:  -Tetanus booster - you can get this at your primary care office  -covid vaccine  -you can schedule this at your pharmacy  -Referrals:  -none  -See a dentist at least yearly  -Get your eyes checked per your eye specialist's recommendations  -Other issues addressed today:  -Balance exercises:  MAke sure you have something sturdy to hold onto if needed while doing theses. STOP if you feel unsafe or that you will fall and let us know so that we can set up physical therapy. Do daily. 1) stand on toes then back down, start with 5x - goal to work up to 20x per day. 2) Lift one leg off floor to the side hold for a few seconds then put back down(does not have to be far) - goal to work up to 20 on each side.3) Stand on one foot - start with 5 second - goal 20 seconds on each side without having to put foot down. Make sure you have something sturdy to hold onto when working on theses.  -healthy whole food plant heavy diet (see below for details) -gradually start adding in some aerobic activity. Start slow with 5 minutes per day of an activity such as walking or cycling and gradually work up (add 5 minutes every 1-2 weeks) to goal of at least 150 minutes per week. Yard work counts! If any chest discomfort, chest pain of difficulty breathing please stop and seek medical care. See below for additional instructions.   -Follow up: -yearly for annual wellness visit with primary care office    FOOD - THE FUEL FOR A HAPPY HEALTHY LIFE: -eat  real food: lots of colorful vegetables (half the plate) -consume on a regular basis: whole grains (make sure first ingredient on label contains the word "whole"), fresh fruits, fish, nuts, seeds, healthy oils (such as olive oil, avocado oil ) -may eat small amounts of dairy and lean meat on occasion, but avoid processed meats such as ham, bacon, lunch meat, etc. -drink water -try to avoid fast food and pre-packaged foods, processed meat -try to avoid foods that contain any ingredients with names you do not recognize  -try to avoid sugar/sweets (except for the natural sugar that occurs in fresh fruit) -try to avoid sweet drinks -try to avoid white rice, white bread, pasta (unless whole grain), white or yellow potatoes  MOVE - the key to keeping your body moving and working best: -gradually increase intentional physical activity -move and stretch your body, legs, feet and arms when sitting for long periods -try to get at least 150 minutes per week, 20-30 minutes of sustained activity or two 10 minute episodes of sustained activity every day. If you need assistance with exercise you could consider the following community options:  -Silver sneakers https://tools.silversneakers.com  -Walk with a Doc: http://stephens-thompson.biz/  -try to include resistance (weight lifting/strength building) and balance exercises twice per week: or the following link for ideas: ChessContest.fr  UpdateClothing.com.cy  STRESS MANAGEMENT - so important for health and well being -try meditating, or just sitting quietly with deep breathing while intentionally relaxing all parts of your body for 5 minutes daily  SOCIAL CONNECTIONS: -options in [place] if you wish to engage in more social and exercise related activities:  -Silver sneakers https://tools.silversneakers.com  -Walk with a  Doc: http://stephens-thompson.biz/  -Check out the [place] 50+ section on the [place] of [employer] (hiking clubs, book clubs, cards and games, chess, exercise classes, aquatic classes and much more) - see the website for details: https://www.[place].gov/departments/parks-recreation/active-adults50  -YouTube has lots of exercise videos for different ages and abilities as well  -[place] (a variety of indoor and outdoor inperson activities for adults). (217)689-2527. [address]  -Virtual Online Classes (a variety of topics): see seniorplanet.org or call (646) [phone]  -consider volunteering at a school, hospice center, church, senior center or elsewhere

## 2022-03-08 NOTE — Progress Notes (Signed)
PATIENT CHECK-IN and HEALTH RISK ASSESSMENT QUESTIONNAIRE:  -completed by phone/video for upcoming Medicare Preventive Visit  Pre-Visit Check-in: 1)Vitals (height, wt, BP, etc) - record in vitals section for visit on day of visit 2)Review and Update Medications, Allergies PMH, Surgeries, Social history in Epic 3)Hospitalizations in the last year with date/reason? No  4)Review and Update Care Team (patient's specialists) in Epic 5) Complete PHQ9 in Epic  6) Complete Fall Screening in Epic 7)Review all Health Maintenance Due and order under PCP if not done.  8)Medicare Wellness Questionnaire: Answer theses question about your habits: Do you drink alcohol? No How many drinks do you have a day?None Have you ever smoked?No Have you stopped smoking and date if applicable? N/A  How many packs a day do you smoke? N/A Do you use smokeless tobacco?No Do you use an illicit drugs?No Do you exercises? Kind of IF so, what type and how many days/minutes per week?She exercises by mowing,cleaning the house,mopping.  Are you sexually active? No Number of partners?N/A What did you eat for breakfast today (or yesterday)?Grape nuts cereal & mandarin orange Typical breakfast Yes What did you eat for lunch today (or yesterday)? Half a stromboli from [employer] No What did you eat for diner today (or yesterday)?Kale & chicken bites yesterday Typical dinner No Typical snacks:Crackers with lemon filling & blueberry muffins every now and then  What beverages do you drink besides water:Tea when goes out to eat.   Answer theses question about you: Can you perform most household chores?Yes Do you find it hard to follow a conversation in a noisy room?Yes Do you find it hard to understand a speaker at church or in a meeting?No Do you often ask people to speak up or repeat themselves?Sometimes on the house phone Do you experience ringing in your ears?No Do you have difficulty understanding a  soft or whispered voice?Probably Do you feel that you have a problem with memory?Yes - saw her doctor about this. Do you often misplace items?Yes - saw her doctor about this recently she reports. Do you balance your checkbook and or bank acounts?Yes Do you feel safe at home?Yes Last dentist visit? One month ago Do you need assistance with any of the following:  Driving?No  Feeding yourself?No  Getting from bed to chair?No  Getting to the toilet?No  Bathing or showering?No  Dressing yourself?No  Managing money?No  Climbing a flight of stairs?No  Preparing meals?No  Do you have Advanced Directives in place (Living Will, Healthcare Power or Attorney)? Living will, but patient states needs to be changed    Last eye Exam and location? 2-3 months ago _0 .[facility] in [place].   Do you currently use prescribed or non-prescribed narcotic or opioid pain medications?No  Do you have a history or close family history of breast, ovarian, tubal or peritoneal cancer or a family member with BRCA (breast cancer susceptibility 1 and 2) gene mutations? Not that she knows of  Nurse/Assistant Credentials/time stamp: Elita Quick D,CMA 3:08pm  ----------------------------------------------------------------------------------------------------------------------------------------------------------------------------------------------------------------------   [place] VISIT WITH PROVIDER: (Welcome to Medicare, initial annual wellness or annual wellness exam)  Virtual Visit via Video Note  I connected with Belinda Harper on 03/08/2022 by a video enabled telemedicine application and verified that I am speaking with the correct person using two identifiers.She preferred to do via audio only.  Location patient: home Location provider:work or home office Persons participating in the virtual visit: patient, provider  Concerns and/or follow up today:  None.  She reports had flu  shot last week at PCP office, but it is not documented. Reached out to staff to correct.   See HM section in Epic for other details of completed HM.    ROS: negative for report of fevers, unintentional weight loss, vision changes, vision loss, hearing loss or change, chest pain, sob, hemoptysis, melena, hematochezia, hematuria, genital discharge or lesions, falls, bleeding or bruising, loc, thoughts of suicide or self harm, memory loss  Patient-completed extensive health risk assessment - reviewed and discussed with the patient: See Health Risk Assessment completed with patient prior to the visit either above or in recent phone note. This was reviewed in detailed with the patient today and appropriate recommendations, orders and referrals were placed as needed per Summary below and patient instructions.   Review of Medical History: -PMH, PSH, Family History and current specialty and care providers reviewed and updated and listed below   Patient Care Team: Panosh, Standley Brooking, MD as PCP - General (Internal Medicine) Pixie Casino, MD as PCP - Cardiology (Cardiology) Clent Jacks, MD (Ophthalmology) Mikey Bussing, DDS (Dentistry) Druscilla Brownie, MD as Consulting Physician (Dermatology) [name], MD as Consulting Physician (Hematology and Oncology) Gaynelle Arabian, MD as Consulting Physician (Orthopedic Surgery)   Past Medical History:  Diagnosis Date   Allergic rhinitis    Anxiety    Bradycardia    Chronic constipation    Chronic gastritis    followed by dr Silverio Decamp (GI);  long hx erosive gastritis with bleed   Depression    Family history of adverse reaction to anesthesia    pt's mother as she gets older organs shut down    GERD (gastroesophageal reflux disease)    Heart palpitations    cardiologist-- dr Debara Pickett;   palpitations w/ intermittant bradycardia;  event monitor 04-01-2020 multiple SVT episodes   Hemorrhoids    s/p banding 05-07-2021 by dr Silverio Decamp   History of asthma     childhood   History of sepsis 02/05/2015   sepsis secondart to cellulitis , left leg/ ankle due to retained ankle hardware   Hyperlipidemia    Hypertension    followed by pcp and cardiology;   normal nuclear stress test 09-28-2012   Iron deficiency anemia secondary to blood loss (chronic)    oncologist--- dr Alvy Bimler;  long hx anemia secondary gi blood loss;  treated with transfusion's and IV iron   Leukopenia    chronic intermittantly --- followed by dr Alvy Bimler   OA (osteoarthritis)    Osteoporosis    Presence of pessary    PSVT (paroxysmal supraventricular tachycardia)    Rectal bleeding    Uterine prolapse    per pt uses pessary   Wears glasses     Past Surgical History:  Procedure Laterality Date   CATARACT EXTRACTION W/ INTRAOCULAR LENS IMPLANT Bilateral    right 2000;  left 2020   COLONOSCOPY WITH ESOPHAGOGASTRODUODENOSCOPY (EGD)  12/15/2020   by dr Silverio Decamp   HARDWARE REMOVAL Left 02/21/2015   Procedure: IRRIGATION AND DEBRIDEMENT HARDWARE REMOVAL LEFT ANKLE ;  Surgeon: Gaynelle Arabian, MD;  Location: [facility];  Service: Orthopedics;  Laterality: Left;   MASS EXCISION Left 09/11/2012   Procedure: 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; Rotation Flap;  Surgeon: Wynonia Sours, MD;  Location: [facility];  Service: Orthopedics;  Laterality: Left;   ORIF ANKLE FRACTURE Left 03/21/2009   _0   by dr Wynelle Link;   fracture and dislocation   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 , saw injury   TRANSANAL HEMORRHOIDAL DEARTERIALIZATION N/A 10/15/2021   Procedure: TRANSANAL HEMORRHOIDAL DEARTERIALIZATION;  Surgeon: Leighton Ruff, MD;  Location: [facility];  Service: General;  Laterality: N/A;    Social History   Socioeconomic History   Marital status: Divorced    Spouse name: Not on file   Number of children: 2   Years of education: Not on file   Highest education level: Not on file  Occupational History    Occupation: retired  Tobacco Use   Smoking status: Never   Smokeless tobacco: Never  Vaping Use   Vaping Use: Never used  Substance and Sexual Activity   Alcohol use: Not Currently   Drug use: No   Sexual activity: Not Currently    Birth control/protection: Post-menopausal    Comment: 1st intercourse- 18, partners- 1   Other Topics Concern   Not on file  Social History Narrative   HH of  1   To rent out room    Retired Dispensing optician   Never smoked    Pet cat allergic    Divorced   Regular exericise  walking mowing the lawnswimming   Silver sneakers    Social Determinants of Health   Financial Resource Strain: Low Risk  (02/23/2021)   Overall Financial Resource Strain (CARDIA)    Difficulty of Paying Living Expenses: Not hard at all  Food Insecurity: No Food Insecurity (02/23/2021)   Hunger Vital Sign    Worried About Running Out of Food in the Last Year: Never true    [place] in the Last Year: Never true  Transportation Needs: No Transportation Needs (02/23/2021)   PRAPARE - Hydrologist (Medical): No    Lack of Transportation (Non-Medical): No  Physical Activity: Inactive (02/23/2021)   Exercise Vital Sign    Days of Exercise per Week: 0 days    Minutes of Exercise per Session: 0 min  Stress: No Stress Concern Present (02/23/2021)   [place]    Feeling of Stress : Not at all  Social Connections: Moderately Integrated (02/23/2021)   Social Connection and Isolation Panel [NHANES]    Frequency of Communication with Friends and Family: More than three times a week    Frequency of Social Gatherings with Friends and Family: More than three times a week    Attends Religious Services: More than 4 times per year    Active Member of [employer] or Organizations: Yes    Attends Archivist Meetings: 1 to 4 times per year    Marital Status: Divorced  Human resources officer  Violence: Not At Risk (02/23/2021)   Humiliation, Afraid, Rape, and Kick questionnaire    Fear of Current or Ex-Partner: No    Emotionally Abused: No    Physically Abused: No    Sexually Abused: No    Family History  Problem Relation Age of Onset   Hyperlipidemia Mother    Heart disease Mother        CABG age 58   Ovarian cancer Mother    Uterine cancer Mother    Arthritis Mother    Osteoporosis Mother    Heart disease Father        CABG age 75   Arthritis Father    Melanoma Father     Current Outpatient Medications on File Prior to Visit  Medication Sig Dispense Refill   amLODipine (  NORVASC) 5 MG tablet TAKE 1 TABLET BY MOUTH DAILY. 90 tablet 3   Calcium Citrate-Vitamin D (CITRACAL + D PO) Take 2 tablets by mouth in the morning and at bedtime. Take 2 tablets po a.m and at lunch. 800 mg calcium     DARBEPOETIN ALFA IJ Inject as directed as needed.     denosumab (PROLIA) 60 MG/ML SOSY injection Inject 60 mg into the skin every 6 (six) months.     docusate sodium (COLACE) 100 MG capsule Take 100 mg by mouth every morning.     Magnesium Glycinate 665 MG CAPS Take 665 mg by mouth daily.     Menaquinone-7 (K2 PO) Take by mouth.     metoprolol succinate (TOPROL-XL) 25 MG 24 hr tablet TAKE 1/2 TABLET (12.5 MG TOTAL) BY MOUTH AT BEDTIME (Patient taking differently: Take 12.5 mg by mouth daily with supper.) 45 tablet 1   Wheat Dextrin (BENEFIBER DRINK MIX PO) Take 1 Scoop by mouth daily.     No current facility-administered medications on file prior to visit.    Allergies  Allergen Reactions   Sulfamethoxazole Rash   Nsaids Other (See Comments)    Drops hemoglobin levels   Sulfa Antibiotics Rash   Yellow Jacket Venom     Other reaction(s): decreased blood pressure, dizziness       Physical Exam Vitals:   03/08/22 1441  BP: 116/76  Pulse: (!) 56  SpO2: 95%   Estimated body mass index is 25 kg/m as calculated from the following:   Height as of this encounter: _0   (1.676 m).   Weight as of this encounter: 154 lb 14.4 oz (70.3 kg).  EKG (optional): deferred due to virtual visit  GENERAL: alert, oriented, appears well and in no acute distress; visual acuity grossly intact, full vision exam deferred due to pandemic and/or virtual encounter  PSYCH/NEURO: pleasant and cooperative, no obvious depression or anxiety, speech and thought processing grossly intact, Cognitive function grossly intact  Flowsheet Row Office Visit from 03/08/2022 in [facility] [place]  PHQ-9 Total Score 0           03/08/2022    2:46 PM 03/02/2022   10:43 AM 02/23/2021    3:22 PM 06/10/2020    9:58 AM 12/05/2019    9:27 AM  Depression screen PHQ 2/9  Decreased Interest 0 0 0 0 0  Down, Depressed, Hopeless 0 0 0 0 0  PHQ - 2 Score 0 0 0 0 0  Altered sleeping 0 1   0  Tired, decreased energy 0 0   0  Change in appetite 0 0   0  Feeling bad or failure about yourself  0 0   0  Trouble concentrating 0 0   0  Moving slowly or fidgety/restless 0 0   0  Suicidal thoughts 0 0   0  PHQ-9 Score 0 1   0  Difficult doing work/chores Not difficult at all Not difficult at all   Not difficult at all     SUMMARY AND PLAN:  Influenza vaccine needed - Plan: Flu Vaccine QUAD High Dose(Fluad)     Reviewed all recommended preventive care measures. The following health maintenance/preventive care measures were recommended/discussed and the patient was referred if needed and if the patient agreeable:   Vaccines: due for tetanus and covid vaccines  Education and counseling on the following was provided based on the above review of health and a plan/checklist for the patient, along with additional  information discussed, was provided for the patient in the patient instructions :  -Information for advanced directives provided - see patient instructions -Provided counseling and plan for difficulty hearing - she does not feel is a big enough problem yet to see ENT/Audiology.  Reports had a mini audiology screen and it was ok.  -Provided counseling and plan for fall prevention. Balance exercises provided.  -Provided counseling and plan for function difficulties/ difficulties with ADLs if applicable per above screening. -Advised and counseled on maintaining healthy weight and healthy lifestyle - including the importance of a health diet, regular physical activity, social connections and stress management. -Advised and counseled on a whole foods based healthy diet and regular exercise: discussed a heart healthy whole foods based diet at length. A summary of a healthy diet was provided in the Patient Instructions.Offered referral to dietician/weight management clinic if applicable and follow up virtual visits to assist further and monitor progress. Recommended regular exercise and discussed options within the community.  -Advised yearly dental visits at minimum and regular eye exams  Follow up: see patient instructions     Patient Instructions  CHECKLIST FOR A HEALTHY LIFE:  -Eat a healthy whole foods based diet, get regular physical activity, manage stress and engage in social connections - see below for specific suggestions and more information.  -Vaccines due:  -Tetanus booster - you can get this at your primary care office  -covid vaccine  -you can schedule this at your pharmacy  -Referrals:  -none  -See a dentist at least yearly  -Get your eyes checked per your eye specialist's recommendations  -Other issues addressed today:  -Balance exercises:  MAke sure you have something sturdy to hold onto if needed while doing theses. STOP if you feel unsafe or that you will fall and let us know so that we can set up physical therapy. Do daily. 1) stand on toes then back down, start with 5x - goal to work up to 20x per day. 2) Lift one leg off floor to the side hold for a few seconds then put back down(does not have to be far) - goal to work up to 20 on each side.3)  Stand on one foot - start with 5 second - goal 20 seconds on each side without having to put foot down. Make sure you have something sturdy to hold onto when working on theses.  -healthy whole food plant heavy diet (see below for details) -gradually start adding in some aerobic activity. Start slow with 5 minutes per day of an activity such as walking or cycling and gradually work up (add 5 minutes every 1-2 weeks) to goal of at least 150 minutes per week. Yard work counts! If any chest discomfort, chest pain of difficulty breathing please stop and seek medical care. See below for additional instructions.   -Follow up: -yearly for annual wellness visit with primary care office  -----------------------------------------------------------------------------------------------------------------------------------------------------------------------------------------------------------------------------------------------------------  [place]: -eat real food: lots of colorful vegetables (half the plate) -consume on a regular basis: whole grains (make sure first ingredient on label contains the word "whole"), fresh fruits, fish, nuts, seeds, healthy oils (such as olive oil, avocado oil ) -may eat small amounts of dairy and lean meat on occasion, but avoid processed meats such as ham, bacon, lunch meat, etc. -drink water -try to avoid fast food and pre-packaged foods, processed meat -try to avoid foods that contain any ingredients with names you do not recognize  -try  to avoid sugar/sweets (except for the natural sugar that occurs in fresh fruit) -try to avoid sweet drinks -try to avoid white rice, white bread, pasta (unless whole grain), white or yellow potatoes  MOVE - the key to keeping your body moving and working best: -gradually increase intentional physical activity -move and stretch your body, legs, feet and arms when sitting for long periods -try to get at least  150 minutes per week, 20-30 minutes of sustained activity or two 10 minute episodes of sustained activity every day. If you need assistance with exercise you could consider the following community options:  -Silver sneakers https://tools.silversneakers.com  -Walk with a Doc: http://stephens-thompson.biz/  -try to include resistance (weight lifting/strength building) and balance exercises twice per week: or the following link for ideas: ChessContest.fr  UpdateClothing.com.cy  STRESS MANAGEMENT - so important for health and well being -try meditating, or just sitting quietly with deep breathing while intentionally relaxing all parts of your body for 5 minutes daily  SOCIAL CONNECTIONS: -options in [place] if you wish to engage in more social and exercise related activities:  -Silver sneakers https://tools.silversneakers.com  -Walk with a Doc: http://stephens-thompson.biz/  -Check out the [place] 50+ section on the [place] of [employer] (hiking clubs, book clubs, cards and games, chess, exercise classes, aquatic classes and much more) - see the website for details: https://www.[place].gov/departments/parks-recreation/active-adults50  -YouTube has lots of exercise videos for different ages and abilities as well  -[place] (a variety of indoor and outdoor inperson activities for adults). 8570742585. [address]  -Virtual Online Classes (a variety of topics): see seniorplanet.org or call (567) 668-9346  -consider volunteering at a school, hospice center, church, senior center or elsewhere         Lucretia Kern, DO

## 2022-03-28 ENCOUNTER — Encounter: Payer: Self-pay | Admitting: Internal Medicine

## 2022-04-14 ENCOUNTER — Ambulatory Visit (INDEPENDENT_AMBULATORY_CARE_PROVIDER_SITE_OTHER): Payer: Medicare Other

## 2022-04-14 DIAGNOSIS — M810 Age-related osteoporosis without current pathological fracture: Secondary | ICD-10-CM | POA: Diagnosis not present

## 2022-04-14 MED ORDER — DENOSUMAB 60 MG/ML ~~LOC~~ SOSY
60.0000 mg | PREFILLED_SYRINGE | Freq: Once | SUBCUTANEOUS | Status: AC
  Administered 2022-04-14: 60 mg via SUBCUTANEOUS

## 2022-04-14 NOTE — Progress Notes (Signed)
Per orders of Dr. Sarajane Jews, injection of Prolia '60mg'$ /ml given by Encarnacion Slates on Right Arm. Patient tolerated injection well.

## 2022-04-27 ENCOUNTER — Other Ambulatory Visit: Payer: Self-pay | Admitting: Internal Medicine

## 2022-05-26 ENCOUNTER — Inpatient Hospital Stay: Payer: Medicare Other

## 2022-05-26 ENCOUNTER — Other Ambulatory Visit: Payer: Self-pay | Admitting: Hematology and Oncology

## 2022-05-26 ENCOUNTER — Telehealth: Payer: Self-pay

## 2022-05-26 ENCOUNTER — Encounter: Payer: Self-pay | Admitting: Hematology and Oncology

## 2022-05-26 ENCOUNTER — Inpatient Hospital Stay (HOSPITAL_BASED_OUTPATIENT_CLINIC_OR_DEPARTMENT_OTHER): Payer: Medicare Other | Admitting: Hematology and Oncology

## 2022-05-26 ENCOUNTER — Inpatient Hospital Stay: Payer: Medicare Other | Attending: Hematology and Oncology

## 2022-05-26 VITALS — BP 135/55 | HR 68 | Temp 97.4°F | Resp 18 | Ht 66.0 in | Wt 161.5 lb

## 2022-05-26 DIAGNOSIS — D509 Iron deficiency anemia, unspecified: Secondary | ICD-10-CM

## 2022-05-26 DIAGNOSIS — D539 Nutritional anemia, unspecified: Secondary | ICD-10-CM | POA: Diagnosis not present

## 2022-05-26 DIAGNOSIS — D649 Anemia, unspecified: Secondary | ICD-10-CM

## 2022-05-26 DIAGNOSIS — Z79899 Other long term (current) drug therapy: Secondary | ICD-10-CM | POA: Diagnosis not present

## 2022-05-26 DIAGNOSIS — D500 Iron deficiency anemia secondary to blood loss (chronic): Secondary | ICD-10-CM | POA: Insufficient documentation

## 2022-05-26 DIAGNOSIS — D72819 Decreased white blood cell count, unspecified: Secondary | ICD-10-CM | POA: Insufficient documentation

## 2022-05-26 LAB — CBC WITH DIFFERENTIAL/PLATELET
Abs Immature Granulocytes: 0.01 10*3/uL (ref 0.00–0.07)
Basophils Absolute: 0 10*3/uL (ref 0.0–0.1)
Basophils Relative: 1 %
Eosinophils Absolute: 0.3 10*3/uL (ref 0.0–0.5)
Eosinophils Relative: 8 %
HCT: 28.3 % — ABNORMAL LOW (ref 36.0–46.0)
Hemoglobin: 8.6 g/dL — ABNORMAL LOW (ref 12.0–15.0)
Immature Granulocytes: 0 %
Lymphocytes Relative: 37 %
Lymphs Abs: 1.4 10*3/uL (ref 0.7–4.0)
MCH: 26.8 pg (ref 26.0–34.0)
MCHC: 30.4 g/dL (ref 30.0–36.0)
MCV: 88.2 fL (ref 80.0–100.0)
Monocytes Absolute: 0.5 10*3/uL (ref 0.1–1.0)
Monocytes Relative: 12 %
Neutro Abs: 1.6 10*3/uL — ABNORMAL LOW (ref 1.7–7.7)
Neutrophils Relative %: 42 %
Platelets: 261 10*3/uL (ref 150–400)
RBC: 3.21 MIL/uL — ABNORMAL LOW (ref 3.87–5.11)
RDW: 17.9 % — ABNORMAL HIGH (ref 11.5–15.5)
WBC: 3.8 10*3/uL — ABNORMAL LOW (ref 4.0–10.5)
nRBC: 0 % (ref 0.0–0.2)

## 2022-05-26 LAB — SAMPLE TO BLOOD BANK

## 2022-05-26 LAB — IRON AND IRON BINDING CAPACITY (CC-WL,HP ONLY)
Iron: 30 ug/dL (ref 28–170)
Saturation Ratios: 9 % — ABNORMAL LOW (ref 10.4–31.8)
TIBC: 344 ug/dL (ref 250–450)
UIBC: 314 ug/dL (ref 148–442)

## 2022-05-26 LAB — FERRITIN: Ferritin: 31 ng/mL (ref 11–307)

## 2022-05-26 MED ORDER — METOPROLOL SUCCINATE ER 25 MG PO TB24: ORAL_TABLET | ORAL | 3 refills | Status: DC

## 2022-05-26 MED ORDER — DARBEPOETIN ALFA 200 MCG/0.4ML IJ SOSY
200.0000 ug | PREFILLED_SYRINGE | Freq: Once | INTRAMUSCULAR | Status: AC
  Administered 2022-05-26: 200 ug via SUBCUTANEOUS
  Filled 2022-05-26: qty 0.4

## 2022-05-26 NOTE — Progress Notes (Signed)
[facility] OFFICE PROGRESS NOTE  Panosh, Standley Brooking, MD  ASSESSMENT & PLAN:  Deficiency anemia The patient has multifactorial anemia, combination of iron deficiency due to chronic GI bleed as well as bone marrow disorder She is anemic today, likely combination of several factors Iron studies are pending She will receive 1 dose of darbepoetin today We will call her with results of iron studies If she is iron deficient, she will receive intravenous iron sucrose  Orders Placed This Encounter  Procedures   Iron and Iron Binding Capacity ([facility] only)    Standing Status:   Future    Standing Expiration Date:   05/27/2023   CBC with Differential/Platelet    Standing Status:   Standing    Number of Occurrences:   22    Standing Expiration Date:   05/27/2023   Ferritin    Standing Status:   Future    Standing Expiration Date:   05/26/2023    The total time spent in the appointment was 20 minutes encounter with patients including review of chart and various tests results, discussions about plan of care and coordination of care plan   All questions were answered. The patient knows to call the clinic with any problems, questions or concerns. No barriers to learning was detected.    [name], MD 2/1/20241:24 PM  INTERVAL HISTORY: [facility] 82 y.o. 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 Summary of her history is as follows: This is a pleasant woman with multifactorial anemia and associated leukopenia. She has a clear element of iron malabsorption with a  superimposed normochromic anemia and chronic leukopenia. The chronic anemia and leukopenia go back as far as we have records (2007) when white counts ran as low as 2700. She has a normal differential. White count average is 3000 and has not changed. Platelet count is normal. Best hemoglobin she achieves with parenteral iron replacement is 11 g. She had a nondiagnostic bone marrow biopsy done in 2006. She had received intravenous iron infusion in the past. Her last colonoscopy in May 2013 showed severe diverticulosis with internal hemorrhoids. She feels well. Denies recent infection. She denies symptoms of fatigue. She tolerated iron supplement well. She had periodic hemorrhoidal bleeding. She was found to have abnormal CBC from intermittently over the years by her primary care doctor She has started taking oral iron supplements since November of last year Most recently, she have noted passage of dark stool She attempted to increase her oral iron supplement but still complain of excessive fatigue Recently, her repeat CBC has dropped from 11-8.2 and hence she was referred back here for further evaluation On 12/15/20, she had repeat EGD and colonoscopy which showed: - Severe diverticulosis in the sigmoid colon, in the descending colon, in the transverse colon and in the ascending colon. - Non-bleeding external and internal hemorrhoids. - No specimens collected.  She denies recent chest pain on exertion, shortness of breath on minimal exertion, pre-syncopal episodes, or palpitations.  The patient denies over the counter NSAID ingestion. She is not on antiplatelets agents. Her last colonoscopy was in 2013  She had no prior history or diagnosis of cancer. Her age appropriate screening programs are up-to-date. She denies any pica and eats a variety of diet.  The patient was prescribed oral iron supplements and she takes 1 dose of oral iron supplement at night to avoid constipation From Sept 2021 till  present, she has received many doses of intravenous iron infusion On December 15, 2020, she had repeat upper endoscopy evaluation due to severe anemia which showed LA Grade B reflux esophagitis with no bleeding. 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 - Gastritis. Biopsied. - Normal examined duodenum.  Since Monoferric IV iron in October 2022, her iron deficiency resolved On May 28, 2021, she started on Aranesp injection  On 06/18/21, she had repeat GI procedure PROCEDURE NOTE: The patient presents with symptomatic grade II-III  hemorrhoids, requesting rubber band ligation of his/her hemorrhoidal disease.  All risks, benefits and alternative forms of therapy were described and informed consent was obtained.   In the Left Lateral Decubitus position anoscopic examination revealed grade II-III hemorrhoids in the left lateral and right posterior position(s).  The anorectum was pre-medicated with 0.125% Nitroglycerine and Recticare The decision was made to band the left lateral internal hemorrhoid, and the [place] was used to perform band ligation without complication.  Digital anorectal examination was then performed to assure proper positioning of the band, and to adjust the banded tissue as required. The patient was discharged home without pain or other issues.  Dietary and behavioral recommendations were given and along with follow-up instructions.    In March 2023, she received both blood transfusion and intravenous iron infusion On October 15, 2021, she underwent ligation and resection of hemorrhoid On 11/09/2011, she received another unit of blood due to severe rectal bleeding On 11/29/2021, she received 1 dose of darbepoetin injection   I have reviewed the past medical history, past surgical history, social history and family history with the patient and they are unchanged from previous note.  ALLERGIES:  is allergic to  sulfamethoxazole, nsaids, sulfa antibiotics, and yellow jacket venom.  MEDICATIONS:  Current Outpatient Medications  Medication Sig Dispense Refill   amLODipine (NORVASC) 5 MG tablet TAKE 1 TABLET BY MOUTH DAILY. 90 tablet 3   Calcium Citrate-Vitamin D (CITRACAL + D PO) Take 2 tablets by mouth in the morning and at bedtime. Take 2 tablets po a.m and at lunch. 800 mg calcium     DARBEPOETIN ALFA IJ Inject as directed as needed.     denosumab (PROLIA) 60 MG/ML SOSY injection Inject 60 mg into the skin every 6 (six) months.     docusate sodium (COLACE) 100 MG capsule Take 100 mg by mouth every morning.     Magnesium Glycinate 665 MG CAPS Take 665 mg by mouth daily.     Menaquinone-7 (K2 PO) Take by mouth.     metoprolol succinate (TOPROL-XL) 25 MG 24 hr tablet TAKE 1/2 TABLET (12.5 MG TOTAL) BY MOUTH AT BEDTIME *SCHEDULE APPOINTMENT FOR FURTHER REFILLS* 45 tablet 3   Wheat Dextrin (BENEFIBER DRINK MIX PO) Take 1 Scoop by mouth daily.     No current facility-administered medications for this visit.     REVIEW OF SYSTEMS:   Constitutional: Denies fevers, chills or night sweats Eyes: Denies blurriness of vision Ears, nose, mouth, throat, and face: Denies mucositis or sore throat Respiratory: Denies cough, dyspnea or wheezes Cardiovascular: Denies palpitation, chest discomfort or lower extremity swelling Gastrointestinal:  Denies nausea,  heartburn or change in bowel habits Skin: Denies abnormal skin rashes Lymphatics: Denies new lymphadenopathy or easy bruising Neurological:Denies numbness, tingling or new weaknesses Behavioral/Psych: Mood is stable, no new changes  All other systems were reviewed with the patient and are negative.  PHYSICAL EXAMINATION: ECOG PERFORMANCE STATUS: 1 - Symptomatic but completely ambulatory  Vitals:   05/26/22 0920  BP: (!) 135/55  Pulse: 68  Resp: 18  Temp: (!) 97.4 F (36.3 C)  SpO2: 100%   Filed Weights   05/26/22 0920  Weight: 161 lb 8 oz (73.3  kg)    GENERAL:alert, no distress and comfortable.  She looks a bit pale   NEURO: alert & oriented x 3 with fluent speech, no focal motor/sensory deficits  LABORATORY DATA:  I have reviewed the data as listed     Component Value Date/Time   NA 141 11/18/2021 0932   NA 142 05/28/2012 1600   K 4.0 11/18/2021 0932   K 3.7 05/28/2012 1600   CL 105 11/18/2021 0932   CL 103 05/28/2012 1600   CO2 32 11/18/2021 0932   CO2 29 05/28/2012 1600   GLUCOSE 73 11/18/2021 0932   GLUCOSE 99 05/28/2012 1600   BUN 15 11/18/2021 0932   BUN 19.3 05/28/2012 1600   CREATININE 0.92 11/18/2021 0932   CREATININE 0.88 12/06/2019 1147   CREATININE 0.8 05/28/2012 1600   CALCIUM 9.3 11/18/2021 0932   CALCIUM 9.5 05/28/2012 1600   PROT 7.8 12/09/2020 1443   PROT 7.3 05/28/2012 1600   ALBUMIN 4.4 12/09/2020 1443   ALBUMIN 3.5 05/28/2012 1600   AST 25 12/09/2020 1443   AST 23 05/28/2012 1600   ALT 15 12/09/2020 1443   ALT 13 05/28/2012 1600   ALKPHOS 79 12/09/2020 1443   ALKPHOS 82 05/28/2012 1600   BILITOT 0.3 12/09/2020 1443   BILITOT <0.20 05/28/2012 1600   GFRNONAA >60 11/18/2021 0932   GFRAA >60 04/23/2018 1634    No results found for: "SPEP", "UPEP"  Lab Results  Component Value Date   WBC 3.8 (L) 05/26/2022   NEUTROABS 1.6 (L) 05/26/2022   HGB 8.6 (L) 05/26/2022   HCT 28.3 (L) 05/26/2022   MCV 88.2 05/26/2022   PLT 261 05/26/2022      Chemistry      Component Value Date/Time   NA 141 11/18/2021 0932   NA 142 05/28/2012 1600   K 4.0 11/18/2021 0932   K 3.7 05/28/2012 1600   CL 105 11/18/2021 0932   CL 103 05/28/2012 1600   CO2 32 11/18/2021 0932   CO2 29 05/28/2012 1600   BUN 15 11/18/2021 0932   BUN 19.3 05/28/2012 1600   CREATININE 0.92 11/18/2021 0932   CREATININE 0.88 12/06/2019 1147   CREATININE 0.8 05/28/2012 1600      Component Value Date/Time   CALCIUM 9.3 11/18/2021 0932   CALCIUM 9.5 05/28/2012 1600   ALKPHOS 79 12/09/2020 1443   ALKPHOS 82 05/28/2012 1600    AST 25 12/09/2020 1443   AST 23 05/28/2012 1600   ALT 15 12/09/2020 1443   ALT 13 05/28/2012 1600   BILITOT 0.3 12/09/2020 1443   BILITOT <0.20 05/28/2012 1600

## 2022-05-26 NOTE — Telephone Encounter (Signed)
-----  Message from [name], MD sent at 05/26/2022  2:14 PM EST ----- Her ferritin is low She needs 2 doses of iron I will send scehduling msg

## 2022-05-26 NOTE — Telephone Encounter (Signed)
Called and left below message. Ask her to call the office for questions. ?

## 2022-05-26 NOTE — Assessment & Plan Note (Signed)
The patient has multifactorial anemia, combination of iron deficiency due to chronic GI bleed as well as bone marrow disorder She is anemic today, likely combination of several factors Iron studies are pending She will receive 1 dose of darbepoetin today We will call her with results of iron studies If she is iron deficient, she will receive intravenous iron sucrose

## 2022-06-03 ENCOUNTER — Inpatient Hospital Stay: Payer: Medicare Other

## 2022-06-03 VITALS — BP 135/52 | HR 65 | Temp 98.1°F | Resp 17

## 2022-06-03 DIAGNOSIS — D539 Nutritional anemia, unspecified: Secondary | ICD-10-CM

## 2022-06-03 DIAGNOSIS — D649 Anemia, unspecified: Secondary | ICD-10-CM

## 2022-06-03 DIAGNOSIS — D500 Iron deficiency anemia secondary to blood loss (chronic): Secondary | ICD-10-CM | POA: Diagnosis not present

## 2022-06-03 MED ORDER — SODIUM CHLORIDE 0.9 % IV SOLN
400.0000 mg | Freq: Once | INTRAVENOUS | Status: AC
  Administered 2022-06-03: 400 mg via INTRAVENOUS
  Filled 2022-06-03: qty 20

## 2022-06-03 MED ORDER — SODIUM CHLORIDE 0.9 % IV SOLN: Freq: Once | INTRAVENOUS | Status: AC

## 2022-06-03 NOTE — Patient Instructions (Signed)

## 2022-06-03 NOTE — Progress Notes (Signed)
Pt declined to stay for post 30 min obs, discharged with VSS, ambulatory to lobby.

## 2022-06-09 ENCOUNTER — Other Ambulatory Visit: Payer: Self-pay | Admitting: Hematology and Oncology

## 2022-06-10 ENCOUNTER — Ambulatory Visit: Payer: Medicare Other

## 2022-06-10 ENCOUNTER — Inpatient Hospital Stay: Payer: Medicare Other

## 2022-06-10 VITALS — BP 150/69 | HR 62 | Temp 98.2°F | Resp 18 | Wt 161.8 lb

## 2022-06-10 DIAGNOSIS — D500 Iron deficiency anemia secondary to blood loss (chronic): Secondary | ICD-10-CM | POA: Diagnosis not present

## 2022-06-10 DIAGNOSIS — D649 Anemia, unspecified: Secondary | ICD-10-CM

## 2022-06-10 DIAGNOSIS — D539 Nutritional anemia, unspecified: Secondary | ICD-10-CM

## 2022-06-10 MED ORDER — SODIUM CHLORIDE 0.9 % IV SOLN: Freq: Once | INTRAVENOUS | Status: AC

## 2022-06-10 MED ORDER — SODIUM CHLORIDE 0.9 % IV SOLN
400.0000 mg | Freq: Once | INTRAVENOUS | Status: AC
  Administered 2022-06-10: 400 mg via INTRAVENOUS
  Filled 2022-06-10: qty 20

## 2022-06-10 NOTE — Patient Instructions (Signed)

## 2022-06-14 ENCOUNTER — Encounter: Payer: Self-pay | Admitting: Obstetrics & Gynecology

## 2022-06-14 ENCOUNTER — Ambulatory Visit (INDEPENDENT_AMBULATORY_CARE_PROVIDER_SITE_OTHER): Payer: Medicare Other | Admitting: Obstetrics & Gynecology

## 2022-06-14 VITALS — BP 120/68 | HR 62

## 2022-06-14 DIAGNOSIS — Z4689 Encounter for fitting and adjustment of other specified devices: Secondary | ICD-10-CM | POA: Diagnosis not present

## 2022-06-14 NOTE — Progress Notes (Signed)
    LILAH JURKIEWICZ May 28, 1940 [id number]        82 y.o.  [id number]   RP: Pessary maintenance  HPI: Well with Milex ring # 4 with support.  Minimal vaginal discharge.  No vaginal bleeding.  No pelvic or vaginal pain.  Urine/BMs normal.     OB History  Gravida Para Term Preterm AB Living  3 2 2   1 2  $ SAB IAB Ectopic Multiple Live Births  1            # Outcome Date GA Lbr Len/2nd Weight Sex Delivery Anes PTL Lv  3 SAB           2 Term           1 Term             Past medical history,surgical history, problem list, medications, allergies, family history and social history were all reviewed and documented in the EPIC chart.   Directed ROS with pertinent positives and negatives documented in the history of present illness/assessment and plan.  Exam:  Vitals:   06/14/22 1411  BP: 120/68  Pulse: 62  SpO2: 98%   General appearance:  Normal  Gynecologic exam: Vulva normal.  Pessary remove, minimal secretions.  Vaginal mucosa intact.  Pessary put back in the vagina.  Good fit.   Assessment/Plan:  82 y.o. [id number]   1. Encounter for pessary maintenance  Well with Milex ring # 4 with support.  Minimal vaginal discharge.  No vaginal bleeding.  No pelvic or vaginal pain.  Urine/BMs normal.  Pessary in good position.  No vaginal irritation.  Minimal secretions.  Pessary cleaned and put back in the vagina.  F/U in 4-6 weeks for pessary maintenance.  [name] MD, 2:24 PM 06/14/2022

## 2022-06-16 ENCOUNTER — Inpatient Hospital Stay (HOSPITAL_BASED_OUTPATIENT_CLINIC_OR_DEPARTMENT_OTHER): Payer: Medicare Other | Admitting: Hematology and Oncology

## 2022-06-16 ENCOUNTER — Inpatient Hospital Stay: Payer: Medicare Other

## 2022-06-16 ENCOUNTER — Encounter: Payer: Self-pay | Admitting: Hematology and Oncology

## 2022-06-16 VITALS — BP 149/64 | HR 80 | Temp 97.6°F | Resp 18 | Ht 66.0 in | Wt 160.8 lb

## 2022-06-16 DIAGNOSIS — D539 Nutritional anemia, unspecified: Secondary | ICD-10-CM | POA: Diagnosis not present

## 2022-06-16 DIAGNOSIS — D500 Iron deficiency anemia secondary to blood loss (chronic): Secondary | ICD-10-CM | POA: Diagnosis not present

## 2022-06-16 DIAGNOSIS — D72819 Decreased white blood cell count, unspecified: Secondary | ICD-10-CM

## 2022-06-16 DIAGNOSIS — D509 Iron deficiency anemia, unspecified: Secondary | ICD-10-CM

## 2022-06-16 LAB — CBC WITH DIFFERENTIAL/PLATELET
Abs Immature Granulocytes: 0.01 10*3/uL (ref 0.00–0.07)
Basophils Absolute: 0.1 10*3/uL (ref 0.0–0.1)
Basophils Relative: 2 %
Eosinophils Absolute: 0.3 10*3/uL (ref 0.0–0.5)
Eosinophils Relative: 7 %
HCT: 34.5 % — ABNORMAL LOW (ref 36.0–46.0)
Hemoglobin: 10.4 g/dL — ABNORMAL LOW (ref 12.0–15.0)
Immature Granulocytes: 0 %
Lymphocytes Relative: 33 %
Lymphs Abs: 1.3 10*3/uL (ref 0.7–4.0)
MCH: 26.9 pg (ref 26.0–34.0)
MCHC: 30.1 g/dL (ref 30.0–36.0)
MCV: 89.4 fL (ref 80.0–100.0)
Monocytes Absolute: 0.3 10*3/uL (ref 0.1–1.0)
Monocytes Relative: 8 %
Neutro Abs: 1.9 10*3/uL (ref 1.7–7.7)
Neutrophils Relative %: 50 %
Platelets: 261 10*3/uL (ref 150–400)
RBC: 3.86 MIL/uL — ABNORMAL LOW (ref 3.87–5.11)
RDW: 18.2 % — ABNORMAL HIGH (ref 11.5–15.5)
WBC: 3.8 10*3/uL — ABNORMAL LOW (ref 4.0–10.5)
nRBC: 0 % (ref 0.0–0.2)

## 2022-06-16 LAB — IRON AND IRON BINDING CAPACITY (CC-WL,HP ONLY)
Iron: 44 ug/dL (ref 28–170)
Saturation Ratios: 13 % (ref 10.4–31.8)
TIBC: 332 ug/dL (ref 250–450)
UIBC: 288 ug/dL (ref 148–442)

## 2022-06-16 LAB — SAMPLE TO BLOOD BANK

## 2022-06-16 LAB — FERRITIN: Ferritin: 274 ng/mL (ref 11–307)

## 2022-06-16 NOTE — Assessment & Plan Note (Addendum)
She has chronic intermittent leukopenia This has been present since 2007, unknown etiology Recent repeat B12 level in July 2023 was adequate She is not symptomatic Observe for now

## 2022-06-16 NOTE — Progress Notes (Signed)
[facility] OFF[facility] PROGRESS NOTE  Patient Care Team: Panosh, Standley Brooking, MD as PCP - General (Internal Medicine) Belinda Pickett Nadean Corwin, MD as PCP - Cardiology (Cardiology) Belinda Jacks, MD (Ophthalmology) [facility], DDS (Dentistry) Belinda Brownie, MD as Consulting Physician (Dermatology) Belinda Lark, MD as Consulting Physician (Hematology and Oncology) Belinda Arabian, MD as Consulting Physician (Orthopedic Surgery)  ASSESSMENT & PLAN:  Deficiency anemia The patient has multifactorial anemia, combination of iron deficiency due to chronic GI bleed as well as bone marrow disorder Her anemia has improved since intravenous iron infusion Iron studies are pending She does not need darbepoetin injection today We will call her with results of iron studies If she is iron deficient, she will receive intravenous iron sucrose  Leukopenia She has chronic intermittent leukopenia This has been present since 2007, unknown etiology Recent repeat B12 level in July 2023 was adequate She is not symptomatic Observe for now  Orders Placed This Encounter  Procedures   Iron and Iron Binding Capacity ([facility] only)    Standing Status:   Standing    Number of Occurrences:   2    Standing Expiration Date:   06/17/2023   Ferritin    Standing Status:   Standing    Number of Occurrences:   2    Standing Expiration Date:   06/17/2023    All questions were answered. The patient knows to call the clinic with any problems, questions or concerns. The total time spent in the appointment was 20 minutes encounter with patients including review of chart and various tests results, discussions about plan of care and coordination of care plan   Belinda Lark, MD 06/16/2022 11:34 AM  INTERVAL HISTORY: Please see below for problem oriented charting. she returns for treatment follow-up for severe chronic anemia She felt much better since her last visit The patient denies any recent signs or symptoms of  bleeding such as spontaneous epistaxis, hematuria or hematochezia.   REVIEW OF SYSTEMS:   Constitutional: Denies fevers, chills or abnormal weight loss Eyes: Denies blurriness of vision Ears, nose, mouth, throat, and face: Denies mucositis or sore throat Respiratory: Denies cough, dyspnea or wheezes Cardiovascular: Denies palpitation, chest discomfort or lower extremity swelling Gastrointestinal:  Denies nausea, heartburn or change in bowel habits Skin: Denies abnormal skin rashes Lymphatics: Denies new lymphadenopathy or easy bruising Neurological:Denies numbness, tingling or new weaknesses Behavioral/Psych: Mood is stable, no new changes  All other systems were reviewed with the patient and are negative.  I have reviewed the past medical history, past surgical history, social history and family history with the patient and they are unchanged from previous note.  ALLERGIES:  is allergic to sulfamethoxazole, nsaids, sulfa antibiotics, and yellow jacket venom.  MEDICATIONS:  Current Outpatient Medications  Medication Sig Dispense Refill   amLODipine (NORVASC) 5 MG tablet TAKE 1 TABLET BY MOUTH DAILY. 90 tablet 3   Calcium Citrate-Vitamin D (CITRACAL + D PO) Take 2 tablets by mouth in the morning and at bedtime. Take 2 tablets po a.m and at lunch. 800 mg calcium     DARBEPOETIN ALFA IJ Inject as directed as needed.     denosumab (PROLIA) 60 MG/ML SOSY injection Inject 60 mg into the skin every 6 (six) months.     docusate sodium (COLACE) 100 MG capsule Take 100 mg by mouth every morning.     Magnesium Glycinate 665 MG CAPS Take 665 mg by mouth daily.     Menaquinone-7 (K2 PO) Take by mouth.  metoprolol succinate (TOPROL-XL) 25 MG 24 hr tablet TAKE 1/2 TABLET (12.5 MG TOTAL) BY MOUTH AT BEDTIME *SCHEDULE APPOINTMENT FOR FURTHER REFILLS* 45 tablet 3   Wheat Dextrin (BENEFIBER DRINK MIX PO) Take 1 Scoop by mouth daily.     No current facility-administered medications for this visit.     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 Summary of her history is as follows: This is a pleasant woman with multifactorial anemia and associated leukopenia. 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 The chronic anemia and leukopenia go back as far as we have records (2007) when white counts ran as low as 2700. She has a normal differential. White count average is 3000 and has not changed. Platelet count is normal. Best hemoglobin she achieves with parenteral iron replacement is 11 g. She had a nondiagnostic bone marrow biopsy done in 2006. She had received intravenous iron infusion in the past. Her last colonoscopy in May 2013 showed severe diverticulosis with internal hemorrhoids. She feels well. Denies recent infection. She denies symptoms of fatigue. She tolerated iron supplement well. She had periodic hemorrhoidal bleeding. She was found to have abnormal CBC from intermittently over the years by her primary care doctor She has started taking oral iron supplements since November of last year Most recently, she have noted passage of dark stool She attempted to increase her oral iron supplement but still complain of excessive fatigue Recently, her repeat CBC has dropped from 11-8.2 and hence she was referred back here for further evaluation On 12/15/20, she had repeat EGD and colonoscopy which showed: - Severe diverticulosis in the sigmoid colon, in the descending colon, in the transverse colon and in the ascending colon. - Non-bleeding external and internal hemorrhoids. - No specimens collected.  She denies recent chest pain on exertion, shortness of breath on minimal exertion, pre-syncopal  episodes, or palpitations.  The patient denies over the counter NSAID ingestion. She is not on antiplatelets agents. Her last colonoscopy was in 2013 She had no prior history or diagnosis of cancer. Her age appropriate screening programs are up-to-date. She denies any pica and eats a variety of diet.  The patient was prescribed oral iron supplements and she takes 1 dose of oral iron supplement at night to avoid constipation From Sept 2021 till present, she has received many doses of intravenous iron infusion On December 15, 2020, she had repeat upper endoscopy evaluation due to severe anemia which showed LA Grade B reflux esophagitis with no bleeding. 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 - Gastritis. Biopsied. - Normal examined duodenum.  Since Monoferric IV iron in October 2022, her iron deficiency resolved On May 28, 2021, she started on Aranesp injection  On 06/18/21, she had repeat GI procedure PROCEDURE NOTE: The patient presents with symptomatic grade II-III  hemorrhoids, requesting rubber band ligation of his/her hemorrhoidal disease.  All risks, benefits and alternative forms of therapy were described and informed consent was obtained.   In the Left Lateral Decubitus position anoscopic examination revealed grade II-III hemorrhoids in the left lateral and right posterior position(s).  The anorectum was pre-medicated with 0.125% Nitroglycerine and Recticare The decision was made to band the left lateral  internal hemorrhoid, and the [place] was used to perform band ligation without complication.  Digital anorectal examination was then performed to assure proper positioning of the band, and to adjust the banded tissue as required. The patient was discharged home without pain or other issues.  Dietary and behavioral recommendations were given and along with follow-up instructions.    In March 2023, she received  both blood transfusion and intravenous iron infusion On October 15, 2021, she underwent ligation and resection of hemorrhoid On 11/09/2011, she received another unit of blood due to severe rectal bleeding On 11/29/2021, she received 1 dose of darbepoetin injection In February 2024, she received 2 doses of intravenous iron sucrose  PHYSICAL EXAMINATION: ECOG PERFORMANCE STATUS: 0 - Asymptomatic  Vitals:   06/16/22 0945  BP: (!) 149/64  Pulse: 80  Resp: 18  Temp: 97.6 F (36.4 C)  SpO2: 99%   Filed Weights   06/16/22 0945  Weight: 160 lb 12.8 oz (72.9 kg)    GENERAL:alert, no distress and comfortable  NEURO: alert & oriented x 3 with fluent speech, no focal motor/sensory deficits  LABORATORY DATA:  I have reviewed the data as listed    Component Value Date/Time   NA 141 11/18/2021 0932   NA 142 05/28/2012 1600   K 4.0 11/18/2021 0932   K 3.7 05/28/2012 1600   CL 105 11/18/2021 0932   CL 103 05/28/2012 1600   CO2 32 11/18/2021 0932   CO2 29 05/28/2012 1600   GLUCOSE 73 11/18/2021 0932   GLUCOSE 99 05/28/2012 1600   BUN 15 11/18/2021 0932   BUN 19.3 05/28/2012 1600   CREATININE 0.92 11/18/2021 0932   CREATININE 0.88 12/06/2019 1147   CREATININE 0.8 05/28/2012 1600   CALCIUM 9.3 11/18/2021 0932   CALCIUM 9.5 05/28/2012 1600   PROT 7.8 12/09/2020 1443   PROT 7.3 05/28/2012 1600   ALBUMIN 4.4 12/09/2020 1443   ALBUMIN 3.5 05/28/2012 1600   AST 25 12/09/2020 1443   AST 23 05/28/2012 1600   ALT 15 12/09/2020 1443   ALT 13 05/28/2012 1600   ALKPHOS 79 12/09/2020 1443   ALKPHOS 82 05/28/2012 1600   BILITOT 0.3 12/09/2020 1443   BILITOT <0.20 05/28/2012 1600   GFRNONAA >60 11/18/2021 0932   GFRAA >60 04/23/2018 1634    No results found for: "SPEP", "UPEP"  Lab Results  Component Value Date   WBC 3.8 (L) 06/16/2022   NEUTROABS 1.9 06/16/2022   HGB 10.4 (L) 06/16/2022   HCT 34.5 (L) 06/16/2022   MCV 89.4 06/16/2022   PLT 261 06/16/2022      Chemistry       Component Value Date/Time   NA 141 11/18/2021 0932   NA 142 05/28/2012 1600   K 4.0 11/18/2021 0932   K 3.7 05/28/2012 1600   CL 105 11/18/2021 0932   CL 103 05/28/2012 1600   CO2 32 11/18/2021 0932   CO2 29 05/28/2012 1600   BUN 15 11/18/2021 0932   BUN 19.3 05/28/2012 1600   CREATININE 0.92 11/18/2021 0932   CREATININE 0.88 12/06/2019 1147   CREATININE 0.8 05/28/2012 1600      Component Value Date/Time   CALCIUM 9.3 11/18/2021 0932   CALCIUM 9.5 05/28/2012 1600   ALKPHOS 79 12/09/2020 1443   ALKPHOS 82 05/28/2012 1600   AST 25 12/09/2020 1443   AST 23 05/28/2012 1600   ALT 15 12/09/2020 1443   ALT 13 05/28/2012 1600   BILITOT 0.3 12/09/2020 1443  BILITOT <0.20 05/28/2012 1600

## 2022-06-16 NOTE — Assessment & Plan Note (Signed)
The patient has multifactorial anemia, combination of iron deficiency due to chronic GI bleed as well as bone marrow disorder Her anemia has improved since intravenous iron infusion Iron studies are pending She does not need darbepoetin injection today We will call her with results of iron studies If she is iron deficient, she will receive intravenous iron sucrose

## 2022-06-17 ENCOUNTER — Telehealth: Payer: Self-pay | Admitting: Hematology and Oncology

## 2022-06-17 NOTE — Telephone Encounter (Signed)
Per 2/23 IB scheduled patient for labs + [place] visit, patient aware of date and time of appointment's.

## 2022-09-15 ENCOUNTER — Encounter: Payer: Self-pay | Admitting: Hematology and Oncology

## 2022-09-15 ENCOUNTER — Inpatient Hospital Stay: Payer: Medicare Other

## 2022-09-15 ENCOUNTER — Inpatient Hospital Stay: Payer: Medicare Other | Attending: Hematology and Oncology | Admitting: Hematology and Oncology

## 2022-09-15 ENCOUNTER — Other Ambulatory Visit: Payer: Self-pay | Admitting: Hematology and Oncology

## 2022-09-15 VITALS — BP 146/54 | HR 66 | Temp 97.8°F | Resp 18 | Ht 66.0 in | Wt 159.2 lb

## 2022-09-15 DIAGNOSIS — D649 Anemia, unspecified: Secondary | ICD-10-CM

## 2022-09-15 DIAGNOSIS — Z79899 Other long term (current) drug therapy: Secondary | ICD-10-CM | POA: Diagnosis not present

## 2022-09-15 DIAGNOSIS — D509 Iron deficiency anemia, unspecified: Secondary | ICD-10-CM

## 2022-09-15 DIAGNOSIS — D72819 Decreased white blood cell count, unspecified: Secondary | ICD-10-CM | POA: Insufficient documentation

## 2022-09-15 LAB — CBC WITH DIFFERENTIAL/PLATELET
Abs Immature Granulocytes: 0.01 10*3/uL (ref 0.00–0.07)
Basophils Absolute: 0.1 10*3/uL (ref 0.0–0.1)
Basophils Relative: 1 %
Eosinophils Absolute: 0.2 10*3/uL (ref 0.0–0.5)
Eosinophils Relative: 6 %
HCT: 31 % — ABNORMAL LOW (ref 36.0–46.0)
Hemoglobin: 9.4 g/dL — ABNORMAL LOW (ref 12.0–15.0)
Immature Granulocytes: 0 %
Lymphocytes Relative: 32 %
Lymphs Abs: 1.2 10*3/uL (ref 0.7–4.0)
MCH: 25.7 pg — ABNORMAL LOW (ref 26.0–34.0)
MCHC: 30.3 g/dL (ref 30.0–36.0)
MCV: 84.7 fL (ref 80.0–100.0)
Monocytes Absolute: 0.3 10*3/uL (ref 0.1–1.0)
Monocytes Relative: 8 %
Neutro Abs: 1.9 10*3/uL (ref 1.7–7.7)
Neutrophils Relative %: 53 %
Platelets: 227 10*3/uL (ref 150–400)
RBC: 3.66 MIL/uL — ABNORMAL LOW (ref 3.87–5.11)
RDW: 17.7 % — ABNORMAL HIGH (ref 11.5–15.5)
WBC: 3.6 10*3/uL — ABNORMAL LOW (ref 4.0–10.5)
nRBC: 0 % (ref 0.0–0.2)

## 2022-09-15 LAB — SAMPLE TO BLOOD BANK

## 2022-09-15 LAB — IRON AND IRON BINDING CAPACITY (CC-WL,HP ONLY)
Iron: 30 ug/dL (ref 28–170)
Saturation Ratios: 8 % — ABNORMAL LOW (ref 10.4–31.8)
TIBC: 371 ug/dL (ref 250–450)
UIBC: 341 ug/dL (ref 148–442)

## 2022-09-15 LAB — FERRITIN: Ferritin: 22 ng/mL (ref 11–307)

## 2022-09-15 NOTE — Assessment & Plan Note (Signed)
She has chronic intermittent leukopenia This has been present since 2007, unknown etiology Recent repeat B12 level in July 2023 was adequate She is not symptomatic Observe for now 

## 2022-09-15 NOTE — Assessment & Plan Note (Signed)
She complained of fatigue Iron studies are pending If iron studies come back adequate, we will schedule darbepoetin injection However, if iron studies are low, we will schedule 2 doses of intravenous iron sucrose and recheck in 3 months I will call her with test results

## 2022-09-15 NOTE — Progress Notes (Signed)
[facility] OFFICE PROGRESS NOTE  Panosh, Neta Mends, MD  ASSESSMENT & PLAN:  Symptomatic anemia She complained of fatigue Iron studies are pending If iron studies come back adequate, we will schedule darbepoetin injection However, if iron studies are low, we will schedule 2 doses of intravenous iron sucrose and recheck in 3 months I will call her with test results  Leukopenia 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   The total time spent in the appointment was 20 minutes encounter with patients including review of chart and various tests results, discussions about plan of care and coordination of care plan   All questions were answered. The patient knows to call the clinic with any problems, questions or concerns. No barriers to learning was detected.    [name], MD 5/23/20249:59 AM  INTERVAL HISTORY: Belinda Harper 82 y.o. female returns for further follow-up She complains of fatigue She has occasional vaginal spotting periodically.  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 Summary of her history is as follows: This is a pleasant woman with multifactorial anemia and associated leukopenia. 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 The chronic anemia and leukopenia go back as far as we have records (2007) when white counts ran as low as 2700. She has a normal differential. White count average is 3000 and has not changed.  Platelet count is normal. Best hemoglobin she achieves with parenteral iron replacement is 11 g. She had a nondiagnostic bone marrow biopsy done in 2006. She had received intravenous iron infusion in the past. Her last colonoscopy in May 2013 showed severe diverticulosis with internal hemorrhoids. She feels well. Denies recent infection. She denies symptoms of fatigue. She tolerated iron supplement well. She had periodic hemorrhoidal bleeding. She was found to have abnormal CBC from intermittently over the years by her primary care doctor She has started taking oral iron supplements since November of last year Most recently, she have noted passage of dark stool She attempted to increase her oral iron supplement but still complain of excessive fatigue Recently, her repeat CBC has dropped from 11-8.2 and hence she was referred back here for further evaluation On 12/15/20, she had repeat EGD and colonoscopy which showed: - Severe diverticulosis in the sigmoid colon, in the descending colon, in the transverse colon and in the ascending colon. - Non-bleeding external and internal hemorrhoids. - No specimens collected.  She denies recent chest pain on exertion, shortness of breath on minimal exertion, pre-syncopal episodes, or palpitations.  The patient denies over the counter NSAID ingestion. She is not on antiplatelets agents. Her last colonoscopy was in 2013 She had no prior history or diagnosis of cancer. Her age appropriate screening programs are up-to-date. She denies any pica and eats a variety of diet.  The patient was prescribed oral iron supplements and she takes 1 dose of oral iron supplement at night to avoid constipation From Sept 2021 till present, she has received many doses of intravenous iron infusion  On December 15, 2020, she had repeat upper endoscopy evaluation due to severe anemia which showed LA Grade B reflux esophagitis with no bleeding. - Gastroesophageal flap valve  classified as Hill Grade II (fold present, opens with respiration). - Gastritis. Biopsied. - Normal examined duodenum.  Since Monoferric IV iron in October 2022, her iron deficiency resolved On May 28, 2021, she started on Aranesp injection  On 06/18/21, she had repeat GI procedure PROCEDURE NOTE: The patient presents with symptomatic grade II-III  hemorrhoids, requesting rubber band ligation of his/her hemorrhoidal disease.  All risks, benefits and alternative forms of therapy were described and informed consent was obtained.   In the Left Lateral Decubitus position anoscopic examination revealed grade II-III hemorrhoids in the left lateral and right posterior position(s).  The anorectum was pre-medicated with 0.125% Nitroglycerine and Recticare The decision was made to band the left lateral internal hemorrhoid, and the [facility] O'Regan System was used to perform band ligation without complication.  Digital anorectal examination was then performed to assure proper positioning of the band, and to adjust the banded tissue as required. The patient was discharged home without pain or other issues.  Dietary and behavioral recommendations were given and along with follow-up instructions.    In March 2023, she received both blood transfusion and intravenous iron infusion On October 15, 2021, she underwent ligation and resection of hemorrhoid On 11/09/2011, she received another unit of blood due to severe rectal bleeding On 11/29/2021, she received 1 dose of darbepoetin injection In February 2024, she received 2 doses of intravenous iron sucrose  I have reviewed the past medical history, past surgical history, social history and family history with the patient and they are unchanged from previous note.  ALLERGIES:  is allergic to sulfamethoxazole, nsaids, sulfa antibiotics, and yellow jacket venom.  MEDICATIONS:  Current Outpatient Medications  Medication Sig Dispense Refill   amLODipine (NORVASC) 5  MG tablet TAKE 1 TABLET BY MOUTH DAILY. 90 tablet 3   Calcium Citrate-Vitamin D (CITRACAL + D PO) Take 2 tablets by mouth in the morning and at bedtime. Take 2 tablets po a.m and at lunch. 800 mg calcium     DARBEPOETIN ALFA IJ Inject as directed as needed.     denosumab (PROLIA) 60 MG/ML SOSY injection Inject 60 mg into the skin every 6 (six) months.     docusate sodium (COLACE) 100 MG capsule Take 100 mg by mouth every morning.     Magnesium Glycinate 665 MG CAPS Take 665 mg by mouth daily.     Menaquinone-7 (K2 PO) Take by mouth.     metoprolol succinate (TOPROL-XL) 25 MG 24 hr tablet TAKE 1/2 TABLET (12.5 MG TOTAL) BY MOUTH AT BEDTIME *SCHEDULE APPOINTMENT FOR FURTHER REFILLS* 45 tablet 3   Wheat Dextrin (BENEFIBER DRINK MIX PO) Take 1 Scoop by mouth daily.     No current facility-administered medications for this visit.     REVIEW OF SYSTEMS:   Constitutional: Denies fevers, chills or night sweats Eyes: Denies blurriness of vision Ears, nose, mouth, throat, and face: Denies mucositis or sore throat Respiratory: Denies cough, dyspnea or wheezes Cardiovascular: Denies palpitation, chest discomfort or lower extremity swelling Gastrointestinal:  Denies nausea, heartburn or change in bowel habits Skin: Denies abnormal skin rashes Lymphatics: Denies new lymphadenopathy or easy bruising Neurological:Denies numbness, tingling or new weaknesses Behavioral/Psych: Mood is stable, no new changes  All other systems were reviewed with the patient and are negative.  PHYSICAL EXAMINATION: ECOG PERFORMANCE STATUS: 1 - Symptomatic  but completely ambulatory  Vitals:   09/15/22 0919  BP: (!) 146/54  Pulse: 66  Resp: 18  Temp: 97.8 F (36.6 C)  SpO2: 99%   Filed Weights   09/15/22 0919  Weight: 159 lb 3.2 oz (72.2 kg)    GENERAL:alert, no distress and comfortable  NEURO: alert & oriented x 3 with fluent speech, no focal motor/sensory deficits  LABORATORY DATA:  I have reviewed the  data as listed     Component Value Date/Time   NA 141 11/18/2021 0932   NA 142 05/28/2012 1600   K 4.0 11/18/2021 0932   K 3.7 05/28/2012 1600   CL 105 11/18/2021 0932   CL 103 05/28/2012 1600   CO2 32 11/18/2021 0932   CO2 29 05/28/2012 1600   GLUCOSE 73 11/18/2021 0932   GLUCOSE 99 05/28/2012 1600   BUN 15 11/18/2021 0932   BUN 19.3 05/28/2012 1600   CREATININE 0.92 11/18/2021 0932   CREATININE 0.88 12/06/2019 1147   CREATININE 0.8 05/28/2012 1600   CALCIUM 9.3 11/18/2021 0932   CALCIUM 9.5 05/28/2012 1600   PROT 7.8 12/09/2020 1443   PROT 7.3 05/28/2012 1600   ALBUMIN 4.4 12/09/2020 1443   ALBUMIN 3.5 05/28/2012 1600   AST 25 12/09/2020 1443   AST 23 05/28/2012 1600   ALT 15 12/09/2020 1443   ALT 13 05/28/2012 1600   ALKPHOS 79 12/09/2020 1443   ALKPHOS 82 05/28/2012 1600   BILITOT 0.3 12/09/2020 1443   BILITOT <0.20 05/28/2012 1600   GFRNONAA >60 11/18/2021 0932   GFRAA >60 04/23/2018 1634    No results found for: "SPEP", "UPEP"  Lab Results  Component Value Date   WBC 3.6 (L) 09/15/2022   NEUTROABS 1.9 09/15/2022   HGB 9.4 (L) 09/15/2022   HCT 31.0 (L) 09/15/2022   MCV 84.7 09/15/2022   PLT 227 09/15/2022      Chemistry      Component Value Date/Time   NA 141 11/18/2021 0932   NA 142 05/28/2012 1600   K 4.0 11/18/2021 0932   K 3.7 05/28/2012 1600   CL 105 11/18/2021 0932   CL 103 05/28/2012 1600   CO2 32 11/18/2021 0932   CO2 29 05/28/2012 1600   BUN 15 11/18/2021 0932   BUN 19.3 05/28/2012 1600   CREATININE 0.92 11/18/2021 0932   CREATININE 0.88 12/06/2019 1147   CREATININE 0.8 05/28/2012 1600      Component Value Date/Time   CALCIUM 9.3 11/18/2021 0932   CALCIUM 9.5 05/28/2012 1600   ALKPHOS 79 12/09/2020 1443   ALKPHOS 82 05/28/2012 1600   AST 25 12/09/2020 1443   AST 23 05/28/2012 1600   ALT 15 12/09/2020 1443   ALT 13 05/28/2012 1600   BILITOT 0.3 12/09/2020 1443   BILITOT <0.20 05/28/2012 1600

## 2022-09-20 ENCOUNTER — Telehealth: Payer: Self-pay | Admitting: Hematology and Oncology

## 2022-09-20 NOTE — Telephone Encounter (Signed)
Spoke with patient confirming upcoming appointment  

## 2022-09-29 ENCOUNTER — Inpatient Hospital Stay: Payer: Medicare Other | Attending: Hematology and Oncology

## 2022-09-29 ENCOUNTER — Other Ambulatory Visit: Payer: Self-pay

## 2022-09-29 VITALS — BP 153/69 | HR 53 | Temp 98.1°F | Resp 16

## 2022-09-29 DIAGNOSIS — D72819 Decreased white blood cell count, unspecified: Secondary | ICD-10-CM | POA: Diagnosis present

## 2022-09-29 DIAGNOSIS — D539 Nutritional anemia, unspecified: Secondary | ICD-10-CM

## 2022-09-29 DIAGNOSIS — Z79899 Other long term (current) drug therapy: Secondary | ICD-10-CM | POA: Insufficient documentation

## 2022-09-29 DIAGNOSIS — D649 Anemia, unspecified: Secondary | ICD-10-CM | POA: Insufficient documentation

## 2022-09-29 MED ORDER — SODIUM CHLORIDE 0.9 % IV SOLN
400.0000 mg | Freq: Once | INTRAVENOUS | Status: AC
  Administered 2022-09-29: 400 mg via INTRAVENOUS
  Filled 2022-09-29: qty 400

## 2022-09-29 MED ORDER — SODIUM CHLORIDE 0.9 % IV SOLN: INTRAVENOUS | Status: DC

## 2022-09-29 NOTE — Patient Instructions (Signed)

## 2022-10-11 ENCOUNTER — Other Ambulatory Visit: Payer: Self-pay | Admitting: Hematology and Oncology

## 2022-10-12 ENCOUNTER — Inpatient Hospital Stay: Payer: Medicare Other

## 2022-10-12 ENCOUNTER — Other Ambulatory Visit: Payer: Self-pay

## 2022-10-12 VITALS — BP 141/65 | HR 55 | Temp 98.1°F | Resp 17

## 2022-10-12 DIAGNOSIS — D649 Anemia, unspecified: Secondary | ICD-10-CM

## 2022-10-12 DIAGNOSIS — D539 Nutritional anemia, unspecified: Secondary | ICD-10-CM

## 2022-10-12 MED ORDER — SODIUM CHLORIDE 0.9 % IV SOLN: Freq: Once | INTRAVENOUS | Status: AC

## 2022-10-12 MED ORDER — SODIUM CHLORIDE 0.9 % IV SOLN
400.0000 mg | Freq: Once | INTRAVENOUS | Status: AC
  Administered 2022-10-12: 400 mg via INTRAVENOUS
  Filled 2022-10-12: qty 400

## 2022-10-12 NOTE — Patient Instructions (Signed)

## 2022-10-12 NOTE — Progress Notes (Signed)
Pt tolerated IV iron infusion well. Monitored for 15 minutes post-infusion without incident. VSS, ambulatory to the lobby.

## 2022-10-17 ENCOUNTER — Telehealth: Payer: Self-pay

## 2022-10-17 ENCOUNTER — Ambulatory Visit (INDEPENDENT_AMBULATORY_CARE_PROVIDER_SITE_OTHER): Payer: Medicare Other

## 2022-10-17 DIAGNOSIS — M810 Age-related osteoporosis without current pathological fracture: Secondary | ICD-10-CM | POA: Diagnosis not present

## 2022-10-17 MED ORDER — DENOSUMAB 60 MG/ML ~~LOC~~ SOSY
60.0000 mg | PREFILLED_SYRINGE | Freq: Once | SUBCUTANEOUS | Status: AC
Start: 2022-10-17 — End: 2022-10-17
  Administered 2022-10-17: 60 mg via SUBCUTANEOUS

## 2022-10-17 NOTE — Progress Notes (Signed)
Per orders of Dr. Clent Ridges , injection of Prolia 60 mg/mL given by Vickii Chafe on Right Upper Arm. Patient tolerated injection well.

## 2022-10-17 NOTE — Telephone Encounter (Signed)
Patient Advocate Encounter  Prior Authorization for Belinda Harper has been approved with [employer].    PA# [id number] Effective dates: 10/17/22 through 10/17/23

## 2022-10-19 ENCOUNTER — Encounter: Payer: Self-pay | Admitting: Obstetrics & Gynecology

## 2022-10-19 ENCOUNTER — Ambulatory Visit (INDEPENDENT_AMBULATORY_CARE_PROVIDER_SITE_OTHER): Payer: Medicare Other | Admitting: Obstetrics & Gynecology

## 2022-10-19 VITALS — BP 126/82 | HR 62

## 2022-10-19 DIAGNOSIS — Z4689 Encounter for fitting and adjustment of other specified devices: Secondary | ICD-10-CM | POA: Diagnosis not present

## 2022-10-19 NOTE — Progress Notes (Signed)
    SHERREE SHANKMAN 1940/08/19 [id number]        82 y.o.  [id number]   RP: Pessary maintenance every 4 months  HPI: Well with Milex ring # 4 with support.  Minimal vaginal reddish spotting. No vaginal discharge. No pelvic or vaginal pain.  Urine/BMs normal.     OB History  Gravida Para Term Preterm AB Living  3 2 2   1 2   SAB IAB Ectopic Multiple Live Births  1            # Outcome Date GA Lbr Len/2nd Weight Sex Delivery Anes PTL Lv  3 SAB           2 Term           1 Term             Past medical history,surgical history, problem list, medications, allergies, family history and social history were all reviewed and documented in the EPIC chart.   Directed ROS with pertinent positives and negatives documented in the history of present illness/assessment and plan.  Exam:  Vitals:   10/19/22 0925  BP: 126/82  Pulse: 62  SpO2: 97%   General appearance:  Normal  Gynecologic exam: Vulva normal.  Pessary remove, minimal secretions.  Speculum:  Mild vaginal inflammation, erosion, no ulceration.  Pessary Cleaned.  Decision to keep it out x 2 weeks.  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    Assessment/Plan:  82 y.o. [id number]    1. Encounter for pessary maintenance  Well with Milex ring # 4 with support. Minimal vaginal reddish spotting. No vaginal discharge. No pelvic or vaginal pain.  Urine/BMs normal.  Mild vaginal inflammation, erosion, no ulceration.  Pessary Cleaned.  Decision to keep it out x 2 weeks.  F/U in 2 weeks to reevaluate and reinsert the pessary as indicated.  Patient will cancel the appointment if decides to keep the pessary out longer or if she puts it back in herself.  Genia Del MD, 9:33 AM 10/19/2022

## 2022-11-01 ENCOUNTER — Encounter: Payer: Self-pay | Admitting: Obstetrics & Gynecology

## 2022-11-01 ENCOUNTER — Ambulatory Visit (INDEPENDENT_AMBULATORY_CARE_PROVIDER_SITE_OTHER): Payer: Medicare Other | Admitting: Obstetrics & Gynecology

## 2022-11-01 VITALS — BP 126/80

## 2022-11-01 DIAGNOSIS — Z4689 Encounter for fitting and adjustment of other specified devices: Secondary | ICD-10-CM

## 2022-11-01 NOTE — Progress Notes (Signed)
    Belinda Harper 1940-08-10 [id number]        82 y.o.  [id number]   RP: Pessary fitting post vaginal erosions  HPI: Had Ring #4 pessary with support which caused vaginal erosions causing mild spotting.  Pessary removed on 10/19/22 and left out x 2 weeks for the erosions to heal.  No vaginal spotting since then.  No vaginal discharge.  No pelvic pain or vaginal pain.  Having a hard time emptying her bladder, so would like to have a pessary again.     OB History  Gravida Para Term Preterm AB Living  3 2 2   1 2   SAB IAB Ectopic Multiple Live Births  1            # Outcome Date GA Lbr Len/2nd Weight Sex Delivery Anes PTL Lv  3 SAB           2 Term           1 Term             Past medical history,surgical history, problem list, medications, allergies, family history and social history were all reviewed and documented in the EPIC chart.   Directed ROS with pertinent positives and negatives documented in the history of present illness/assessment and plan.  Exam:  Vitals:   11/01/22 1321  BP: 126/80   General appearance:  Normal  Gynecologic exam: Vulva normal.  Speculum:  Vaginal erosions healed.  No bleeding, no discharge.  Fitted a smaller pessary, Ring #3 with support.  Very good fit.  Didn't fall out with valsalva and squatting.   Assessment/Plan:  82 y.o. [id number]   1. Encounter for fitting and adjustment of pessary  Had Ring #4 pessary with support which caused vaginal erosions causing mild spotting.  Pessary removed on 10/19/22 and left out x 2 weeks for the erosions to heal.  No vaginal spotting since then.  No vaginal discharge.  No pelvic pain or vaginal pain.  Having a hard time emptying her bladder, so would like to have a pessary again.   Decision to fit a smaller pessary to avoid erosions in the future.  Well with Ring #3 with support.  Good fit, didn't fall out with valsalva and squatting. F/U for pessary maintenance in 3-4 months with Dr Edward Jolly.  Genia Del MD,  1:49 PM 11/01/2022

## 2022-11-21 ENCOUNTER — Other Ambulatory Visit: Payer: Self-pay

## 2022-11-21 ENCOUNTER — Inpatient Hospital Stay: Payer: Medicare Other | Attending: Hematology and Oncology

## 2022-11-21 DIAGNOSIS — D649 Anemia, unspecified: Secondary | ICD-10-CM | POA: Diagnosis present

## 2022-11-21 DIAGNOSIS — D509 Iron deficiency anemia, unspecified: Secondary | ICD-10-CM

## 2022-11-21 DIAGNOSIS — D72819 Decreased white blood cell count, unspecified: Secondary | ICD-10-CM | POA: Diagnosis present

## 2022-11-21 LAB — CBC WITH DIFFERENTIAL/PLATELET
Abs Immature Granulocytes: 0.02 10*3/uL (ref 0.00–0.07)
Basophils Absolute: 0.1 10*3/uL (ref 0.0–0.1)
Basophils Relative: 1 %
Eosinophils Absolute: 0.1 10*3/uL (ref 0.0–0.5)
Eosinophils Relative: 4 %
HCT: 30.7 % — ABNORMAL LOW (ref 36.0–46.0)
Hemoglobin: 9.4 g/dL — ABNORMAL LOW (ref 12.0–15.0)
Immature Granulocytes: 1 %
Lymphocytes Relative: 28 %
Lymphs Abs: 1 10*3/uL (ref 0.7–4.0)
MCH: 26.6 pg (ref 26.0–34.0)
MCHC: 30.6 g/dL (ref 30.0–36.0)
MCV: 87 fL (ref 80.0–100.0)
Monocytes Absolute: 0.3 10*3/uL (ref 0.1–1.0)
Monocytes Relative: 7 %
Neutro Abs: 2.2 10*3/uL (ref 1.7–7.7)
Neutrophils Relative %: 59 %
Platelets: 231 10*3/uL (ref 150–400)
RBC: 3.53 MIL/uL — ABNORMAL LOW (ref 3.87–5.11)
RDW: 20.1 % — ABNORMAL HIGH (ref 11.5–15.5)
WBC: 3.7 10*3/uL — ABNORMAL LOW (ref 4.0–10.5)
nRBC: 0 % (ref 0.0–0.2)

## 2022-11-21 LAB — IRON AND IRON BINDING CAPACITY (CC-WL,HP ONLY)
Iron: 35 ug/dL (ref 28–170)
Saturation Ratios: 11 % (ref 10.4–31.8)
TIBC: 323 ug/dL (ref 250–450)
UIBC: 288 ug/dL (ref 148–442)

## 2022-11-21 LAB — FERRITIN: Ferritin: 73 ng/mL (ref 11–307)

## 2022-11-24 ENCOUNTER — Encounter: Payer: Self-pay | Admitting: Hematology and Oncology

## 2022-11-24 ENCOUNTER — Inpatient Hospital Stay: Payer: Medicare Other

## 2022-11-24 ENCOUNTER — Inpatient Hospital Stay: Payer: Medicare Other | Attending: Hematology and Oncology | Admitting: Hematology and Oncology

## 2022-11-24 VITALS — BP 138/53 | HR 76 | Temp 97.7°F | Resp 18 | Ht 66.0 in | Wt 156.8 lb

## 2022-11-24 DIAGNOSIS — D539 Nutritional anemia, unspecified: Secondary | ICD-10-CM | POA: Diagnosis not present

## 2022-11-24 DIAGNOSIS — K922 Gastrointestinal hemorrhage, unspecified: Secondary | ICD-10-CM | POA: Diagnosis not present

## 2022-11-24 DIAGNOSIS — D649 Anemia, unspecified: Secondary | ICD-10-CM

## 2022-11-24 DIAGNOSIS — D72819 Decreased white blood cell count, unspecified: Secondary | ICD-10-CM

## 2022-11-24 DIAGNOSIS — D500 Iron deficiency anemia secondary to blood loss (chronic): Secondary | ICD-10-CM | POA: Insufficient documentation

## 2022-11-24 MED ORDER — DARBEPOETIN ALFA 200 MCG/0.4ML IJ SOSY
200.0000 ug | PREFILLED_SYRINGE | Freq: Once | INTRAMUSCULAR | Status: AC
  Administered 2022-11-24: 200 ug via SUBCUTANEOUS
  Filled 2022-11-24: qty 0.4

## 2022-11-24 NOTE — Assessment & Plan Note (Signed)
She has chronic intermittent leukopenia This has been present since 2007, unknown etiology Recent repeat B12 level in July 2023 was adequate She is not symptomatic Observe for now

## 2022-11-24 NOTE — Progress Notes (Signed)
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 Her anemia has improved since intravenous iron infusion She does not need IV iron today Previously, her blood count improves with darbepoetin injection I recommend 1 dose of darbepoetin today and recheck in a few months  Leukopenia She has chronic intermittent leukopenia This has been present since 2007, unknown etiology Recent repeat B12 level in July 2023 was adequate She is not symptomatic Observe for now  Orders Placed This Encounter  Procedures   Ferritin    Standing Status:   Standing    Number of Occurrences:   12    Standing Expiration Date:   11/24/2023   Iron and Iron Binding Capacity ([facility] only)    Standing Status:   Standing    Number of Occurrences:   12    Standing Expiration Date:   11/24/2023   Vitamin B12    Standing Status:   Future    Standing Expiration Date:   11/24/2023   Erythropoietin    Standing Status:   Future    Standing Expiration Date:   11/24/2023    The total time spent in the appointment was 25 minutes encounter with patients including review of chart and various tests results, discussions about plan of care and coordination of care plan   All questions were answered. The patient knows to call the clinic with any problems, questions or concerns. No barriers to learning was detected.    [name], MD 8/1/20242:49 PM  INTERVAL HISTORY: Belinda Harper 82 y.o. 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 Summary of her history is as follows: This is a pleasant woman with multifactorial anemia and associated leukopenia. 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 The chronic anemia and leukopenia go back as far as we have records (2007) when white counts ran as low as 2700. She has a normal differential. White count average is 3000 and has not changed. Platelet count is normal. Best hemoglobin she achieves with parenteral iron replacement is 11 g. She had a nondiagnostic bone marrow biopsy done in 2006. She had received intravenous iron infusion in the past. Her last colonoscopy in May 2013 showed severe diverticulosis with internal hemorrhoids. She feels well. Denies recent infection. She denies symptoms of fatigue. She tolerated iron supplement well. She had periodic hemorrhoidal bleeding. She was found to have abnormal CBC from intermittently over the years by her primary care doctor She has started taking oral iron supplements since November of last year Most recently, she have noted passage of dark stool She attempted to increase her oral iron supplement but still complain of excessive fatigue Recently, her repeat CBC has dropped from 11-8.2 and hence she was referred back here for further evaluation On 12/15/20, she had repeat EGD and colonoscopy which showed: - Severe diverticulosis in the sigmoid colon, in the descending colon, in the transverse  colon and in the ascending colon. - Non-bleeding external and internal hemorrhoids. - No specimens collected.  She denies recent chest pain on exertion, shortness of breath on minimal exertion, pre-syncopal episodes, or palpitations.  The patient denies over the counter NSAID ingestion. She is not on antiplatelets agents. Her last colonoscopy was in 2013 She had no  prior history or diagnosis of cancer. Her age appropriate screening programs are up-to-date. She denies any pica and eats a variety of diet.  The patient was prescribed oral iron supplements and she takes 1 dose of oral iron supplement at night to avoid constipation From Sept 2021 till present, she has received many doses of intravenous iron infusion On December 15, 2020, she had repeat upper endoscopy evaluation due to severe anemia which showed LA Grade B reflux esophagitis with no bleeding. 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 - Gastritis. Biopsied. - Normal examined duodenum.  Since Monoferric IV iron in October 2022, her iron deficiency resolved On May 28, 2021, she started on Aranesp injection  On 06/18/21, she had repeat GI procedure PROCEDURE NOTE: The patient presents with symptomatic grade II-III  hemorrhoids, requesting rubber band ligation of his/her hemorrhoidal disease.  All risks, benefits and alternative forms of therapy were described and informed consent was obtained.   In the Left Lateral Decubitus position anoscopic examination revealed grade II-III hemorrhoids in the left lateral and right posterior position(s).  The anorectum was pre-medicated with 0.125% Nitroglycerine and Recticare The decision was made to band the left lateral internal hemorrhoid, and the [facility] O'Regan System was used to perform band ligation without complication.  Digital anorectal examination was then performed to assure proper positioning of the band, and to adjust the banded tissue as required. The patient was discharged home without pain or other issues.  Dietary and behavioral recommendations were given and along with follow-up instructions.    In March 2023, she received both blood transfusion and intravenous iron infusion On October 15, 2021, she underwent ligation and resection of hemorrhoid On 11/09/2011, she received another unit of  blood due to severe rectal bleeding On 11/29/2021, she received 1 dose of darbepoetin injection In February and June 2024, she received intravenous iron sucrose   I have reviewed the past medical history, past surgical history, social history and family history with the patient and they are unchanged from previous note.  ALLERGIES:  is allergic to sulfamethoxazole, nsaids, sulfa antibiotics, and yellow jacket venom.  MEDICATIONS:  Current Outpatient Medications  Medication Sig Dispense Refill   amLODipine (NORVASC) 5 MG tablet TAKE 1 TABLET BY MOUTH DAILY. 90 tablet 3   Calcium Citrate-Vitamin D (CITRACAL + D PO) Take 2 tablets by mouth in the morning and at bedtime. Take 2 tablets po a.m and at lunch. 800 mg calcium     DARBEPOETIN ALFA IJ Inject as directed as needed.     denosumab (PROLIA) 60 MG/ML SOSY injection Inject 60 mg into the skin every 6 (six) months.     docusate sodium (COLACE) 100 MG capsule Take 100 mg by mouth every morning.     Magnesium Glycinate 665 MG CAPS Take 665 mg by mouth daily.     Menaquinone-7 (K2 PO) Take by mouth.     metoprolol succinate (TOPROL-XL) 25 MG 24 hr tablet TAKE 1/2 TABLET (12.5 MG TOTAL) BY MOUTH AT BEDTIME *SCHEDULE APPOINTMENT FOR FURTHER REFILLS* 45 tablet 3   Wheat Dextrin (BENEFIBER DRINK MIX PO) Take 1 Scoop  by mouth daily.     No current facility-administered medications for this visit.     REVIEW OF SYSTEMS:   Constitutional: Denies fevers, chills or night sweats Eyes: Denies blurriness of vision Ears, nose, mouth, throat, and face: Denies mucositis or sore throat Respiratory: Denies cough, dyspnea or wheezes Cardiovascular: Denies palpitation, chest discomfort or lower extremity swelling Gastrointestinal:  Denies nausea, heartburn or change in bowel habits Skin: Denies abnormal skin rashes Lymphatics: Denies new lymphadenopathy or easy bruising Neurological:Denies numbness, tingling or new weaknesses Behavioral/Psych: Mood is  stable, no new changes  All other systems were reviewed with the patient and are negative.  PHYSICAL EXAMINATION: ECOG PERFORMANCE STATUS: 1 - Symptomatic but completely ambulatory  Vitals:   11/24/22 1142  BP: (!) 138/53  Pulse: 76  Resp: 18  Temp: 97.7 F (36.5 C)  SpO2: 96%   Filed Weights   11/24/22 1142  Weight: 156 lb 12.8 oz (71.1 kg)    GENERAL:alert, no distress and comfortable NEURO: alert & oriented x 3 with fluent speech, no focal motor/sensory deficits  LABORATORY DATA:  I have reviewed the data as listed     Component Value Date/Time   NA 141 11/18/2021 0932   NA 142 05/28/2012 1600   K 4.0 11/18/2021 0932   K 3.7 05/28/2012 1600   CL 105 11/18/2021 0932   CL 103 05/28/2012 1600   CO2 32 11/18/2021 0932   CO2 29 05/28/2012 1600   GLUCOSE 73 11/18/2021 0932   GLUCOSE 99 05/28/2012 1600   BUN 15 11/18/2021 0932   BUN 19.3 05/28/2012 1600   CREATININE 0.92 11/18/2021 0932   CREATININE 0.88 12/06/2019 1147   CREATININE 0.8 05/28/2012 1600   CALCIUM 9.3 11/18/2021 0932   CALCIUM 9.5 05/28/2012 1600   PROT 7.8 12/09/2020 1443   PROT 7.3 05/28/2012 1600   ALBUMIN 4.4 12/09/2020 1443   ALBUMIN 3.5 05/28/2012 1600   AST 25 12/09/2020 1443   AST 23 05/28/2012 1600   ALT 15 12/09/2020 1443   ALT 13 05/28/2012 1600   ALKPHOS 79 12/09/2020 1443   ALKPHOS 82 05/28/2012 1600   BILITOT 0.3 12/09/2020 1443   BILITOT <0.20 05/28/2012 1600   GFRNONAA >60 11/18/2021 0932   GFRAA >60 04/23/2018 1634    No results found for: "SPEP", "UPEP"  Lab Results  Component Value Date   WBC 3.7 (L) 11/21/2022   NEUTROABS 2.2 11/21/2022   HGB 9.4 (L) 11/21/2022   HCT 30.7 (L) 11/21/2022   MCV 87.0 11/21/2022   PLT 231 11/21/2022      Chemistry      Component Value Date/Time   NA 141 11/18/2021 0932   NA 142 05/28/2012 1600   K 4.0 11/18/2021 0932   K 3.7 05/28/2012 1600   CL 105 11/18/2021 0932   CL 103 05/28/2012 1600   CO2 32 11/18/2021 0932   CO2 29  05/28/2012 1600   BUN 15 11/18/2021 0932   BUN 19.3 05/28/2012 1600   CREATININE 0.92 11/18/2021 0932   CREATININE 0.88 12/06/2019 1147   CREATININE 0.8 05/28/2012 1600      Component Value Date/Time   CALCIUM 9.3 11/18/2021 0932   CALCIUM 9.5 05/28/2012 1600   ALKPHOS 79 12/09/2020 1443   ALKPHOS 82 05/28/2012 1600   AST 25 12/09/2020 1443   AST 23 05/28/2012 1600   ALT 15 12/09/2020 1443   ALT 13 05/28/2012 1600   BILITOT 0.3 12/09/2020 1443   BILITOT <0.20 05/28/2012 1600

## 2022-11-24 NOTE — Assessment & Plan Note (Addendum)
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

## 2023-01-10 LAB — HM MAMMOGRAPHY

## 2023-01-12 ENCOUNTER — Encounter: Payer: Self-pay | Admitting: Internal Medicine

## 2023-01-12 ENCOUNTER — Inpatient Hospital Stay: Payer: Medicare Other | Attending: Hematology and Oncology

## 2023-01-12 DIAGNOSIS — D509 Iron deficiency anemia, unspecified: Secondary | ICD-10-CM | POA: Diagnosis present

## 2023-01-12 DIAGNOSIS — Z79899 Other long term (current) drug therapy: Secondary | ICD-10-CM | POA: Insufficient documentation

## 2023-01-12 DIAGNOSIS — D500 Iron deficiency anemia secondary to blood loss (chronic): Secondary | ICD-10-CM

## 2023-01-12 DIAGNOSIS — D539 Nutritional anemia, unspecified: Secondary | ICD-10-CM

## 2023-01-12 LAB — CBC WITH DIFFERENTIAL/PLATELET
Abs Immature Granulocytes: 0.01 10*3/uL (ref 0.00–0.07)
Basophils Absolute: 0 10*3/uL (ref 0.0–0.1)
Basophils Relative: 1 %
Eosinophils Absolute: 0.2 10*3/uL (ref 0.0–0.5)
Eosinophils Relative: 5 %
HCT: 29.6 % — ABNORMAL LOW (ref 36.0–46.0)
Hemoglobin: 8.7 g/dL — ABNORMAL LOW (ref 12.0–15.0)
Immature Granulocytes: 0 %
Lymphocytes Relative: 36 %
Lymphs Abs: 1.2 10*3/uL (ref 0.7–4.0)
MCH: 25.2 pg — ABNORMAL LOW (ref 26.0–34.0)
MCHC: 29.4 g/dL — ABNORMAL LOW (ref 30.0–36.0)
MCV: 85.8 fL (ref 80.0–100.0)
Monocytes Absolute: 0.3 10*3/uL (ref 0.1–1.0)
Monocytes Relative: 8 %
Neutro Abs: 1.8 10*3/uL (ref 1.7–7.7)
Neutrophils Relative %: 50 %
Platelets: 212 10*3/uL (ref 150–400)
RBC: 3.45 MIL/uL — ABNORMAL LOW (ref 3.87–5.11)
RDW: 18 % — ABNORMAL HIGH (ref 11.5–15.5)
WBC: 3.5 10*3/uL — ABNORMAL LOW (ref 4.0–10.5)
nRBC: 0 % (ref 0.0–0.2)

## 2023-01-12 LAB — IRON AND IRON BINDING CAPACITY (CC-WL,HP ONLY)
Iron: 31 ug/dL (ref 28–170)
Saturation Ratios: 8 % — ABNORMAL LOW (ref 10.4–31.8)
TIBC: 388 ug/dL (ref 250–450)
UIBC: 357 ug/dL (ref 148–442)

## 2023-01-12 LAB — FERRITIN: Ferritin: 22 ng/mL (ref 11–307)

## 2023-01-12 LAB — VITAMIN B12: Vitamin B-12: 302 pg/mL (ref 180–914)

## 2023-01-14 LAB — ERYTHROPOIETIN: Erythropoietin: 90 m[IU]/mL — ABNORMAL HIGH (ref 2.6–18.5)

## 2023-01-18 ENCOUNTER — Ambulatory Visit: Payer: Medicare Other

## 2023-01-18 ENCOUNTER — Other Ambulatory Visit: Payer: Self-pay | Admitting: Hematology and Oncology

## 2023-01-18 VITALS — Ht 66.0 in | Wt 156.0 lb

## 2023-01-18 DIAGNOSIS — Z0000 Encounter for general adult medical examination without abnormal findings: Secondary | ICD-10-CM | POA: Diagnosis not present

## 2023-01-18 NOTE — Patient Instructions (Addendum)
Ms. Woolford , Thank you for taking time to come for your Medicare Wellness Visit. I appreciate your ongoing commitment to your health goals. Please review the following plan we discussed and let me know if I can assist you in the future.   Referrals/Orders/Follow-Ups/Clinician Recommendations:   This is a list of the screening recommended for you and due dates:  Health Maintenance  Topic Date Due   DTaP/Tdap/Td vaccine (3 - Tdap) 06/23/2019   Flu Shot  11/24/2022   COVID-19 Vaccine (3 - Pfizer risk series) 03/09/2023*   Medicare Annual Wellness Visit  01/18/2024   Pneumonia Vaccine  Completed   DEXA scan (bone density measurement)  Completed   Zoster (Shingles) Vaccine  Completed   HPV Vaccine  Aged Out  *Topic was postponed. The date shown is not the original due date.    Advanced directives: (Declined) Advance directive discussed with you today. Even though you declined this today, please call our office should you change your mind, and we can give you the proper paperwork for you to fill out.  Next Medicare Annual Wellness Visit scheduled for next year: Yes

## 2023-01-18 NOTE — Progress Notes (Signed)
Subjective:   Belinda Harper is a 82 y.o. female who presents for Medicare Annual (Subsequent) preventive examination.  Visit Complete: Virtual  I connected with  Belinda Harper on 01/18/23 by a audio enabled telemedicine application and verified that I am speaking with the correct person using two identifiers.  Patient Location: Home  Provider Location: Home Office  I discussed the limitations of evaluation and management by telemedicine. The patient expressed understanding and agreed to proceed.  Patient Medicare AWV questionnaire was completed by the patient on 01/14/23; I have confirmed that all information answered by patient is correct and no changes since this date.  Because this visit was a virtual/telehealth visit, some criteria may be missing or patient reported. Any vitals not documented were not able to be obtained and vitals that have been documented are patient reported.       Objective:    Today's Vitals   01/18/23 1517  Weight: 156 lb (70.8 kg)  Height: 5\' 6"  (1.676 m)   Body mass index is 25.18 kg/m.     01/18/2023    3:33 PM 11/08/2021   11:48 AM 10/15/2021    7:10 AM 08/09/2021    9:31 AM 06/25/2021   10:26 AM 05/17/2021    3:06 PM 02/23/2021    3:23 PM  Advanced Directives  Does Patient Have a Medical Advance Directive? No No No No No No No  Would patient like information on creating a medical advance directive? No - Patient declined No - Patient declined No - Patient declined No - Patient declined No - Patient declined No - Patient declined Yes (MAU/Ambulatory/Procedural Areas - Information given)    Current Medications (verified) Outpatient Encounter Medications as of 01/18/2023  Medication Sig   amLODipine (NORVASC) 5 MG tablet TAKE 1 TABLET BY MOUTH DAILY.   Calcium Citrate-Vitamin D (CITRACAL + D PO) Take 2 tablets by mouth in the morning and at bedtime. Take 2 tablets po a.m and at lunch. 800 mg calcium   DARBEPOETIN ALFA IJ Inject as directed as  needed.   denosumab (PROLIA) 60 MG/ML SOSY injection Inject 60 mg into the skin every 6 (six) months.   docusate sodium (COLACE) 100 MG capsule Take 100 mg by mouth every morning.   Magnesium Glycinate 665 MG CAPS Take 665 mg by mouth daily.   Menaquinone-7 (K2 PO) Take by mouth.   metoprolol succinate (TOPROL-XL) 25 MG 24 hr tablet TAKE 1/2 TABLET (12.5 MG TOTAL) BY MOUTH AT BEDTIME *SCHEDULE APPOINTMENT FOR FURTHER REFILLS*   Wheat Dextrin (BENEFIBER DRINK MIX PO) Take 1 Scoop by mouth daily.   No facility-administered encounter medications on file as of 01/18/2023.    Allergies (verified) Sulfamethoxazole, Nsaids, Sulfa antibiotics, and Yellow jacket venom   History: Past Medical History:  Diagnosis Date   Allergic rhinitis    Anxiety    Bradycardia    Chronic constipation    Chronic gastritis    followed by dr Lavon Paganini (GI);  long hx erosive gastritis with bleed   Depression    Family history of adverse reaction to anesthesia    pt's mother as she gets older organs shut down    GERD (gastroesophageal reflux disease)    Heart palpitations    cardiologist-- dr Rennis Golden;   palpitations w/ intermittant bradycardia;  event monitor 04-01-2020 multiple SVT episodes   Hemorrhoids    s/p banding 05-07-2021 by dr Lavon Paganini   History of asthma    childhood   History of sepsis 02/05/2015  sepsis secondart to cellulitis , left leg/ ankle due to retained ankle hardware   Hyperlipidemia    Hypertension    followed by pcp and cardiology;   normal nuclear stress test 09-28-2012   Iron deficiency anemia secondary to blood loss (chronic)    oncologist--- dr Bertis Ruddy;  long hx anemia secondary gi blood loss;  treated with transfusion's and IV iron   Leukopenia    chronic intermittantly --- followed by dr Bertis Ruddy   OA (osteoarthritis)    Osteoporosis    Presence of pessary    PSVT (paroxysmal supraventricular tachycardia)    Rectal bleeding    Uterine prolapse    per pt uses pessary    Wears glasses    Past Surgical History:  Procedure Laterality Date   CATARACT EXTRACTION W/ INTRAOCULAR LENS IMPLANT Bilateral    right 2000;  left 2020   COLONOSCOPY WITH ESOPHAGOGASTRODUODENOSCOPY (EGD)  12/15/2020   by dr Lavon Paganini   HARDWARE REMOVAL Left 02/21/2015   Procedure: IRRIGATION AND DEBRIDEMENT HARDWARE REMOVAL LEFT ANKLE ;  Surgeon: Ollen Gross, MD;  Location: [facility];  Service: Orthopedics;  Laterality: Left;   MASS EXCISION Left 09/11/2012   Procedure: 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; Rotation Flap;  Surgeon: Nicki Reaper, MD;  Location: [facility];  Service: Orthopedics;  Laterality: Left;   ORIF ANKLE FRACTURE Left 03/21/2009   @[facility]   by dr Lequita Halt;   fracture and dislocation   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 , saw injury   TRANSANAL HEMORRHOIDAL DEARTERIALIZATION N/A 10/15/2021   Procedure: TRANSANAL HEMORRHOIDAL DEARTERIALIZATION;  Surgeon: Romie Levee, MD;  Location: [facility] [place];  Service: General;  Laterality: N/A;   Family History  Problem Relation Age of Onset   Hyperlipidemia Mother    Heart disease Mother        CABG age 64   Ovarian cancer Mother    Uterine cancer Mother    Arthritis Mother    Osteoporosis Mother    Heart disease Father        CABG age 49   Arthritis Father    Melanoma Father    Social History   Socioeconomic History   Marital status: Divorced    Spouse name: Not on file   Number of children: 2   Years of education: Not on file   Highest education level: Not on file  Occupational History   Occupation: retired  Tobacco Use   Smoking status: Never   Smokeless tobacco: Never  Vaping Use   Vaping status: Never Used  Substance and Sexual Activity   Alcohol use: Not Currently   Drug use: No   Sexual activity: Not Currently    Birth control/protection: Post-menopausal    Comment: 1st intercourse- 18, partners- 1   Other Topics Concern   Not on  file  Social History Narrative   HH of  1   To rent out room    Retired Artist   Never smoked    Pet cat allergic    Divorced   Regular exericise  walking mowing the lawnswimming   Silver sneakers    Social Determinants of Health   Financial Resource Strain: Low Risk  (01/14/2023)   Overall Financial Resource Strain (CARDIA)    Difficulty of Paying Living Expenses: Not hard at all  Food Insecurity: No Food Insecurity (01/14/2023)   Hunger Vital Sign    Worried About Running Out of Food in the Last  Year: Never true    Ran Out of Food in the Last Year: Never true  Transportation Needs: No Transportation Needs (01/14/2023)   PRAPARE - Administrator, Civil Service (Medical): No    Lack of Transportation (Non-Medical): No  Physical Activity: Insufficiently Active (01/14/2023)   Exercise Vital Sign    Days of Exercise per Week: 2 days    Minutes of Exercise per Session: 30 min  Stress: No Stress Concern Present (01/14/2023)   [employer] of Occupational Health - Occupational Stress Questionnaire    Feeling of Stress : Only a little  Social Connections: Unknown (01/14/2023)   Social Connection and Isolation Panel [NHANES]    Frequency of Communication with Friends and Family: More than three times a week    Frequency of Social Gatherings with Friends and Family: More than three times a week    Attends Religious Services: Not on Marketing executive or Organizations: No    Attends Engineer, structural: Not on file    Marital Status: Divorced    Tobacco Counseling Counseling given: Not Answered   Clinical Intake:  Pre-visit preparation completed: Yes  Pain : No/denies pain     BMI - recorded: 25.18 Nutritional Status: BMI 25 -29 Overweight Nutritional Risks: None Diabetes: No  How often do you need to have someone help you when you read instructions, pamphlets, or other written materials from your doctor or pharmacy?: 1 -  Never  Interpreter Needed?: No  Information entered by :: Theresa Mulligan LPN   Activities of Daily Living    01/14/2023    9:56 AM 03/01/2022    6:04 PM  In your present state of health, do you have any difficulty performing the following activities:  Hearing? 0 0  Vision? 0 0  Difficulty concentrating or making decisions? 0 1  Walking or climbing stairs? 0 0  Dressing or bathing? 0 0  Doing errands, shopping? 0 0  Preparing Food and eating ? N N  Using the Toilet? N N  In the past six months, have you accidently leaked urine? Malvin Johns  Comment Wears a pad. Followed by GYN   Do you have problems with loss of bowel control? N Y  Managing your Medications? N N  Managing your Finances? N N  Housekeeping or managing your Housekeeping? N N    Patient Care Team: Panosh, Neta Mends, MD as PCP - General (Internal Medicine) Rennis Golden Lisette Abu, MD as PCP - Cardiology (Cardiology) Ernesto Rutherford, MD (Ophthalmology) Filbert Berthold, DDS (Dentistry) Cherlyn Roberts, MD as Consulting Physician (Dermatology) [name], MD as Consulting Physician (Hematology and Oncology) Ollen Gross, MD as Consulting Physician (Orthopedic Surgery)  Indicate any recent Medical Services you may have received from other than [facility] providers in the past year (date may be approximate).     Assessment:   This is a routine wellness examination for Belinda Harper.  Hearing/Vision screen Hearing Screening - Comments:: Denies hearing difficulties   Vision Screening - Comments:: Wears rx glasses - up to date with routine eye exams with  Dr Dione Booze   Goals Addressed               This Visit's Progress     Stay healthy as possible (pt-stated)         Depression Screen    01/18/2023    3:22 PM 03/08/2022    2:46 PM 03/02/2022   10:43 AM 02/23/2021    3:22 PM  06/10/2020    9:58 AM 12/05/2019    9:27 AM 11/21/2018   11:18 AM  PHQ 2/9 Scores  PHQ - 2 Score 0 0 0 0 0 0 0  PHQ- 9 Score  0 1   0     Fall Risk     01/14/2023    9:56 AM 03/08/2022    2:45 PM 03/02/2022   10:43 AM 03/01/2022    6:04 PM 02/23/2021    3:25 PM  Fall Risk   Falls in the past year? 1 1 1 1 1   Number falls in past yr: 0 1 1 1 1   Injury with Fall? 0 1 1 1 1   Comment     FX a couple ribs  Risk for fall due to :  History of fall(s) Other (Comment)  Impaired vision;Impaired balance/gait;Impaired mobility  Follow up Falls prevention discussed Falls evaluation completed Falls evaluation completed  Falls prevention discussed    MEDICARE RISK AT HOME: Medicare Risk at Home Any stairs in or around the home?: Yes If so, are there any without handrails?: No Home free of loose throw rugs in walkways, pet beds, electrical cords, etc?: Yes Adequate lighting in your home to reduce risk of falls?: Yes Life alert?: No Use of a cane, walker or w/c?: No Grab bars in the bathroom?: Yes Shower chair or bench in shower?: No Elevated toilet seat or a handicapped toilet?: Yes  TIMED UP AND GO:  Was the test performed?  No    Cognitive Function:    10/20/2017   10:00 AM  MMSE - Mini Mental State Exam  Orientation to time 5  Orientation to Place 5  Registration 3  Attention/ Calculation 5  Recall 3  Language- name 2 objects 2  Language- repeat 1  Language- follow 3 step command 3  Language- read & follow direction 1  Write a sentence 1  Copy design 1  Total score 30        01/18/2023    3:33 PM 02/23/2021    3:34 PM 12/05/2019    9:33 AM  6CIT Screen  What Year? 0 points 0 points 0 points  What month? 0 points 0 points 0 points  What time? 0 points 0 points 0 points  Count back from 20 0 points 0 points 0 points  Months in reverse 0 points 0 points 0 points  Repeat phrase 0 points 2 points 2 points  Total Score 0 points 2 points 2 points    Immunizations Immunization History  Administered Date(s) Administered   Fluad Quad(high Dose 65+) 03/02/2021, 03/02/2022   Influenza Whole 03/10/2009, 01/27/2010   Influenza,  High Dose Seasonal PF 03/23/2016, 04/24/2017, 02/09/2018, 03/19/2019   Influenza,inj,Quad PF,6+ Mos 04/08/2020   PFIZER(Purple Top)SARS-COV-2 Vaccination 02/12/2020, 03/04/2020   Pneumococcal Conjugate-13 12/18/2013   Pneumococcal Polysaccharide-23 04/25/2006, 10/18/2012   Td 04/25/1997, 06/22/2009   Zoster Recombinant(Shingrix) 11/01/2017, 01/04/2018   Zoster, Live 06/28/2010    TDAP status: Due, Education has been provided regarding the importance of this vaccine. Advised may receive this vaccine at local pharmacy or Health Dept. Aware to provide a copy of the vaccination record if obtained from local pharmacy or Health Dept. Verbalized acceptance and understanding.  Flu Vaccine status: Due, Education has been provided regarding the importance of this vaccine. Advised may receive this vaccine at local pharmacy or Health Dept. Aware to provide a copy of the vaccination record if obtained from local pharmacy or Health Dept. Verbalized acceptance and understanding.  Pneumococcal vaccine status: Up to date  Covid-19 vaccine status: Declined, Education has been provided regarding the importance of this vaccine but patient still declined. Advised may receive this vaccine at local pharmacy or Health Dept.or vaccine clinic. Aware to provide a copy of the vaccination record if obtained from local pharmacy or Health Dept. Verbalized acceptance and understanding.  Qualifies for Shingles Vaccine? Yes   Zostavax completed Yes   Shingrix Completed?: Yes  Screening Tests Health Maintenance  Topic Date Due   DTaP/Tdap/Td (3 - Tdap) 06/23/2019   INFLUENZA VACCINE  11/24/2022   COVID-19 Vaccine (3 - Pfizer risk series) 03/09/2023 (Originally 04/01/2020)   Medicare Annual Wellness (AWV)  01/18/2024   Pneumonia Vaccine 15+ Years old  Completed   DEXA SCAN  Completed   Zoster Vaccines- Shingrix  Completed   HPV VACCINES  Aged Out    Health Maintenance  Health Maintenance Due  Topic Date Due    DTaP/Tdap/Td (3 - Tdap) 06/23/2019   INFLUENZA VACCINE  11/24/2022      Bone Density status: Completed 01/04/22. Results reflect: Bone density results: OSTEOPOROSIS. Repeat every   years.    Additional Screening:    Vision Screening: Recommended annual ophthalmology exams for early detection of glaucoma and other disorders of the eye. Is the patient up to date with their annual eye exam?  Yes  Who is the provider or what is the name of the office in which the patient attends annual eye exams? Dr Dione Booze If pt is not established with a provider, would they like to be referred to a provider to establish care? No .   Dental Screening: Recommended annual dental exams for proper oral hygiene   Community Resource Referral / Chronic Care Management:  CRR required this visit?  No   CCM required this visit?  No     Plan:     I have personally reviewed and noted the following in the patient's chart:   Medical and social history Use of alcohol, tobacco or illicit drugs  Current medications and supplements including opioid prescriptions. Patient is not currently taking opioid prescriptions. Functional ability and status Nutritional status Physical activity Advanced directives List of other physicians Hospitalizations, surgeries, and ER visits in previous 12 months Vitals Screenings to include cognitive, depression, and falls Referrals and appointments  In addition, I have reviewed and discussed with patient certain preventive protocols, quality metrics, and best practice recommendations. A written personalized care plan for preventive services as well as general preventive health recommendations were provided to patient.     Tillie Rung, LPN   [id number]   After Visit Summary: (MyChart) Due to this being a telephonic visit, the after visit summary with patients personalized plan was offered to patient via MyChart   Nurse Notes: None

## 2023-01-19 ENCOUNTER — Inpatient Hospital Stay: Payer: Medicare Other | Admitting: Hematology and Oncology

## 2023-01-19 ENCOUNTER — Inpatient Hospital Stay: Payer: Medicare Other

## 2023-01-19 ENCOUNTER — Encounter: Payer: Self-pay | Admitting: Hematology and Oncology

## 2023-01-19 VITALS — BP 123/59 | HR 62 | Temp 98.0°F | Resp 18 | Wt 154.8 lb

## 2023-01-19 VITALS — BP 141/68 | HR 58 | Temp 98.1°F | Resp 18

## 2023-01-19 DIAGNOSIS — D72819 Decreased white blood cell count, unspecified: Secondary | ICD-10-CM | POA: Diagnosis not present

## 2023-01-19 DIAGNOSIS — D539 Nutritional anemia, unspecified: Secondary | ICD-10-CM

## 2023-01-19 DIAGNOSIS — D649 Anemia, unspecified: Secondary | ICD-10-CM

## 2023-01-19 DIAGNOSIS — D509 Iron deficiency anemia, unspecified: Secondary | ICD-10-CM | POA: Diagnosis not present

## 2023-01-19 MED ORDER — SODIUM CHLORIDE 0.9 % IV SOLN: Freq: Once | INTRAVENOUS | Status: AC

## 2023-01-19 MED ORDER — SODIUM CHLORIDE 0.9 % IV SOLN
400.0000 mg | Freq: Once | INTRAVENOUS | Status: AC
  Administered 2023-01-19: 400 mg via INTRAVENOUS
  Filled 2023-01-19: qty 400

## 2023-01-19 NOTE — Progress Notes (Signed)
[facility] OFFICE PROGRESS NOTE  Panosh, Neta Mends, MD  Assessment & Plan Iron Deficiency Anemia Hemoglobin decreased from 9.4 to 8.7, ferritin decreased from 73 to 22, and iron saturation is low. No reported blood in stool or urine. Despite increased red meat consumption, iron levels continue to decrease, suggesting ongoing bleeding. -Administer one dose of iron today and two additional doses after today. -Return for follow-up and labs in three months (week before Christmas).  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 The patient knows to call the clinic with any problems, questions or concerns. No barriers to learning was detected.    [name], MD 9/26/20242:09 PM  INTERVAL HISTORY: The patient, with a known history of iron deficiency anemia, has been closely monitored due to a decreasing hemoglobin level. Despite a recent drop in hemoglobin from 9.4 to 8.7, the patient reports feeling more energetic than before. They deny any signs of bleeding, such as blood in stool or urine. The patient has been consuming more red meat in an attempt to increase their iron levels. However, recent lab results indicate a decrease in ferritin from 73 to 22 and low iron saturation, suggesting a recurrence of iron deficiency anemia.  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 Summary of her history is as follows: This is a pleasant woman with multifactorial anemia and associated leukopenia.  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 The chronic anemia and leukopenia go back as far as we have records (2007) when white counts ran as low as 2700. She has a normal differential. White count average is 3000 and has not changed. Platelet count is normal. Best hemoglobin she achieves with parenteral iron replacement is 11 g. She had a nondiagnostic bone marrow biopsy done in 2006. She had received intravenous iron infusion in the past. Her last colonoscopy in May 2013 showed severe diverticulosis with internal hemorrhoids. She feels well. Denies recent infection. She denies symptoms of fatigue. She tolerated iron supplement well. She had periodic hemorrhoidal bleeding. She was found to have abnormal CBC from intermittently over the years by her primary care doctor She has started taking oral iron supplements since November of last year Most recently, she have noted passage of dark stool She attempted to increase her oral iron supplement but still complain of excessive fatigue Recently, her repeat CBC has dropped from 11-8.2 and hence she was referred back here for further evaluation On 12/15/20, she had repeat EGD and colonoscopy which showed: - Severe diverticulosis in the sigmoid colon, in the descending colon, in the transverse colon and in the ascending colon. - Non-bleeding external and internal hemorrhoids. - No specimens collected.  She denies recent chest pain on exertion, shortness of breath on minimal exertion, pre-syncopal episodes, or palpitations.  The patient denies over the counter NSAID ingestion. She is not on antiplatelets agents. Her last colonoscopy was in 2013 She had no prior history or diagnosis of cancer. Her age appropriate screening  programs are up-to-date. She denies any pica and eats a variety of diet.  The patient was prescribed oral iron supplements and she takes 1 dose of oral iron  supplement at night to avoid constipation From Sept 2021 till present, she has received many doses of intravenous iron infusion On December 15, 2020, she had repeat upper endoscopy evaluation due to severe anemia which showed LA Grade B reflux esophagitis with no bleeding. 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 - Gastritis. Biopsied. - Normal examined duodenum.  Since Monoferric IV iron in October 2022, her iron deficiency resolved On May 28, 2021, she started on Aranesp injection  On 06/18/21, she had repeat GI procedure PROCEDURE NOTE: The patient presents with symptomatic grade II-III  hemorrhoids, requesting rubber band ligation of his/her hemorrhoidal disease.  All risks, benefits and alternative forms of therapy were described and informed consent was obtained.   In the Left Lateral Decubitus position anoscopic examination revealed grade II-III hemorrhoids in the left lateral and right posterior position(s).  The anorectum was pre-medicated with 0.125% Nitroglycerine and Recticare The decision was made to band the left lateral internal hemorrhoid, and the [facility] O'Regan System was used to perform band ligation without complication.  Digital anorectal examination was then performed to assure proper positioning of the band, and to adjust the banded tissue as required. The patient was discharged home without pain or other issues.  Dietary and behavioral recommendations were given and along with follow-up instructions.    In March 2023, she received both blood transfusion and intravenous iron infusion On October 15, 2021, she underwent ligation and resection of hemorrhoid On 11/09/2011, she received another unit of blood due to severe rectal bleeding On 11/29/2021, she received 1 dose of darbepoetin injection In February and June 2024, she received intravenous iron sucrose   I have reviewed the past medical history, past surgical  history, social history and family history with the patient and they are unchanged from previous note.  ALLERGIES:  is allergic to sulfamethoxazole, nsaids, sulfa antibiotics, and yellow jacket venom.  MEDICATIONS:  Current Outpatient Medications  Medication Sig Dispense Refill   amLODipine (NORVASC) 5 MG tablet TAKE 1 TABLET BY MOUTH DAILY. 90 tablet 3   Calcium Citrate-Vitamin D (CITRACAL + D PO) Take 2 tablets by mouth in the morning and at bedtime. Take 2 tablets po a.m and at lunch. 800 mg calcium     DARBEPOETIN ALFA IJ Inject as directed as needed.     denosumab (PROLIA) 60 MG/ML SOSY injection Inject 60 mg into the skin every 6 (six) months.     docusate sodium (COLACE) 100 MG capsule Take 100 mg by mouth every morning.     Magnesium Glycinate 665 MG CAPS Take 665 mg by mouth daily.     Menaquinone-7 (K2 PO) Take by mouth.     metoprolol succinate (TOPROL-XL) 25 MG 24 hr tablet TAKE 1/2 TABLET (12.5 MG TOTAL) BY MOUTH AT BEDTIME *SCHEDULE APPOINTMENT FOR FURTHER REFILLS* 45 tablet 3   Wheat Dextrin (BENEFIBER DRINK MIX PO) Take 1 Scoop by mouth daily.     No current facility-administered medications for this visit.     REVIEW OF SYSTEMS:   Constitutional: Denies fevers, chills or night sweats Eyes: Denies blurriness of vision Ears, nose, mouth, throat, and face: Denies mucositis or sore throat Respiratory: Denies cough, dyspnea or wheezes Cardiovascular: Denies palpitation, chest discomfort or lower extremity swelling Gastrointestinal:  Denies nausea, heartburn or change  in bowel habits Skin: Denies abnormal skin rashes Lymphatics: Denies new lymphadenopathy or easy bruising Neurological:Denies numbness, tingling or new weaknesses Behavioral/Psych: Mood is stable, no new changes  All other systems were reviewed with the patient and are negative.  PHYSICAL EXAMINATION: ECOG PERFORMANCE STATUS: 1 - Symptomatic but completely ambulatory  Vitals:   01/19/23 0917  BP: (!)  123/59  Pulse: 62  Resp: 18  Temp: 98 F (36.7 C)  SpO2: 95%   Filed Weights   01/19/23 0917  Weight: 154 lb 12.8 oz (70.2 kg)    GENERAL:alert, no distress and comfortable  LABORATORY DATA:  I have reviewed the data as listed     Component Value Date/Time   NA 141 11/18/2021 0932   NA 142 05/28/2012 1600   K 4.0 11/18/2021 0932   K 3.7 05/28/2012 1600   CL 105 11/18/2021 0932   CL 103 05/28/2012 1600   CO2 32 11/18/2021 0932   CO2 29 05/28/2012 1600   GLUCOSE 73 11/18/2021 0932   GLUCOSE 99 05/28/2012 1600   BUN 15 11/18/2021 0932   BUN 19.3 05/28/2012 1600   CREATININE 0.92 11/18/2021 0932   CREATININE 0.88 12/06/2019 1147   CREATININE 0.8 05/28/2012 1600   CALCIUM 9.3 11/18/2021 0932   CALCIUM 9.5 05/28/2012 1600   PROT 7.8 12/09/2020 1443   PROT 7.3 05/28/2012 1600   ALBUMIN 4.4 12/09/2020 1443   ALBUMIN 3.5 05/28/2012 1600   AST 25 12/09/2020 1443   AST 23 05/28/2012 1600   ALT 15 12/09/2020 1443   ALT 13 05/28/2012 1600   ALKPHOS 79 12/09/2020 1443   ALKPHOS 82 05/28/2012 1600   BILITOT 0.3 12/09/2020 1443   BILITOT <0.20 05/28/2012 1600   GFRNONAA >60 11/18/2021 0932   GFRAA >60 04/23/2018 1634    No results found for: "SPEP", "UPEP"  Lab Results  Component Value Date   WBC 3.5 (L) 01/12/2023   NEUTROABS 1.8 01/12/2023   HGB 8.7 (L) 01/12/2023   HCT 29.6 (L) 01/12/2023   MCV 85.8 01/12/2023   PLT 212 01/12/2023      Chemistry      Component Value Date/Time   NA 141 11/18/2021 0932   NA 142 05/28/2012 1600   K 4.0 11/18/2021 0932   K 3.7 05/28/2012 1600   CL 105 11/18/2021 0932   CL 103 05/28/2012 1600   CO2 32 11/18/2021 0932   CO2 29 05/28/2012 1600   BUN 15 11/18/2021 0932   BUN 19.3 05/28/2012 1600   CREATININE 0.92 11/18/2021 0932   CREATININE 0.88 12/06/2019 1147   CREATININE 0.8 05/28/2012 1600      Component Value Date/Time   CALCIUM 9.3 11/18/2021 0932   CALCIUM 9.5 05/28/2012 1600   ALKPHOS 79 12/09/2020 1443    ALKPHOS 82 05/28/2012 1600   AST 25 12/09/2020 1443   AST 23 05/28/2012 1600   ALT 15 12/09/2020 1443   ALT 13 05/28/2012 1600   BILITOT 0.3 12/09/2020 1443   BILITOT <0.20 05/28/2012 1600

## 2023-01-19 NOTE — Progress Notes (Signed)
Patient tolerated her iron well- routinely receives iron- declined her 30 minute observation.  Ambulatory to the lobby.

## 2023-01-19 NOTE — Patient Instructions (Signed)
Iron Sucrose Injection What is this medication? IRON SUCROSE (EYE ern SOO krose) treats low levels of iron (iron deficiency anemia) in people with kidney disease. Iron is a mineral that plays an important role in making red blood cells, which carry oxygen from your lungs to the rest of your body. This medicine may be used for other purposes; ask your health care provider or pharmacist if you have questions. COMMON BRAND NAME(S): Venofer What should I tell my care team before I take this medication? They need to know if you have any of these conditions: Anemia not caused by low iron levels Heart disease High levels of iron in the blood Kidney disease Liver disease An unusual or allergic reaction to iron, other medications, foods, dyes, or preservatives Pregnant or trying to get pregnant Breastfeeding How should I use this medication? This medication is for infusion into a vein. It is given in a hospital or clinic setting. Talk to your care team about the use of this medication in children. While this medication may be prescribed for children as young as 2 years for selected conditions, precautions do apply. Overdosage: If you think you have taken too much of this medicine contact a poison control center or emergency room at once. NOTE: This medicine is only for you. Do not share this medicine with others. What if I miss a dose? Keep appointments for follow-up doses. It is important not to miss your dose. Call your care team if you are unable to keep an appointment. What may interact with this medication? Do not take this medication with any of the following: Deferoxamine Dimercaprol Other iron products This medication may also interact with the following: Chloramphenicol Deferasirox This list may not describe all possible interactions. Give your health care provider a list of all the medicines, herbs, non-prescription drugs, or dietary supplements you use. Also tell them if you smoke,  drink alcohol, or use illegal drugs. Some items may interact with your medicine. What should I watch for while using this medication? Visit your care team regularly. Tell your care team if your symptoms do not start to get better or if they get worse. You may need blood work done while you are taking this medication. You may need to follow a special diet. Talk to your care team. Foods that contain iron include: whole grains/cereals, dried fruits, beans, or peas, leafy green vegetables, and organ meats (liver, kidney). What side effects may I notice from receiving this medication? Side effects that you should report to your care team as soon as possible: Allergic reactions--skin rash, itching, hives, swelling of the face, lips, tongue, or throat Low blood pressure--dizziness, feeling faint or lightheaded, blurry vision Shortness of breath Side effects that usually do not require medical attention (report to your care team if they continue or are bothersome): Flushing Headache Joint pain Muscle pain Nausea Pain, redness, or irritation at injection site This list may not describe all possible side effects. Call your doctor for medical advice about side effects. You may report side effects to FDA at 1-800-FDA-1088. Where should I keep my medication? This medication is given in a hospital or clinic and will not be stored at home. NOTE: This sheet is a summary. It may not cover all possible information. If you have questions about this medicine, talk to your doctor, pharmacist, or health care provider.  2023 Elsevier/Gold Standard (2020-07-23 00:00:00)

## 2023-02-01 ENCOUNTER — Telehealth: Payer: Self-pay | Admitting: Hematology and Oncology

## 2023-02-01 NOTE — Telephone Encounter (Signed)
Left patient a message in regards to scheduled appointment times/dates; left call back number if needed for rescheduling

## 2023-02-02 ENCOUNTER — Ambulatory Visit: Payer: Medicare Other

## 2023-02-07 ENCOUNTER — Other Ambulatory Visit: Payer: Self-pay | Admitting: Internal Medicine

## 2023-02-07 DIAGNOSIS — I10 Essential (primary) hypertension: Secondary | ICD-10-CM

## 2023-02-09 ENCOUNTER — Inpatient Hospital Stay: Payer: Medicare Other | Attending: Hematology and Oncology

## 2023-02-09 VITALS — BP 148/55 | HR 60 | Temp 97.8°F | Resp 16

## 2023-02-09 DIAGNOSIS — D509 Iron deficiency anemia, unspecified: Secondary | ICD-10-CM | POA: Diagnosis present

## 2023-02-09 DIAGNOSIS — D649 Anemia, unspecified: Secondary | ICD-10-CM

## 2023-02-09 DIAGNOSIS — D539 Nutritional anemia, unspecified: Secondary | ICD-10-CM

## 2023-02-09 MED ORDER — SODIUM CHLORIDE 0.9 % IV SOLN: Freq: Once | INTRAVENOUS | Status: AC

## 2023-02-09 MED ORDER — SODIUM CHLORIDE 0.9 % IV SOLN
400.0000 mg | Freq: Once | INTRAVENOUS | Status: AC
  Administered 2023-02-09: 400 mg via INTRAVENOUS
  Filled 2023-02-09: qty 400

## 2023-02-09 NOTE — Progress Notes (Signed)
Pt observed for 10 minutes post Venofer infusion. Pt tolerated Tx well w/out incident. VSS at discharge.  Ambulatory to lobby.

## 2023-02-09 NOTE — Patient Instructions (Signed)
Iron Sucrose Injection What is this medication? IRON SUCROSE (EYE ern SOO krose) treats low levels of iron (iron deficiency anemia) in people with kidney disease. Iron is a mineral that plays an important role in making red blood cells, which carry oxygen from your lungs to the rest of your body. This medicine may be used for other purposes; ask your health care provider or pharmacist if you have questions. COMMON BRAND NAME(S): Venofer What should I tell my care team before I take this medication? They need to know if you have any of these conditions: Anemia not caused by low iron levels Heart disease High levels of iron in the blood Kidney disease Liver disease An unusual or allergic reaction to iron, other medications, foods, dyes, or preservatives Pregnant or trying to get pregnant Breastfeeding How should I use this medication? This medication is for infusion into a vein. It is given in a hospital or clinic setting. Talk to your care team about the use of this medication in children. While this medication may be prescribed for children as young as 2 years for selected conditions, precautions do apply. Overdosage: If you think you have taken too much of this medicine contact a poison control center or emergency room at once. NOTE: This medicine is only for you. Do not share this medicine with others. What if I miss a dose? Keep appointments for follow-up doses. It is important not to miss your dose. Call your care team if you are unable to keep an appointment. What may interact with this medication? Do not take this medication with any of the following: Deferoxamine Dimercaprol Other iron products This medication may also interact with the following: Chloramphenicol Deferasirox This list may not describe all possible interactions. Give your health care provider a list of all the medicines, herbs, non-prescription drugs, or dietary supplements you use. Also tell them if you smoke,  drink alcohol, or use illegal drugs. Some items may interact with your medicine. What should I watch for while using this medication? Visit your care team regularly. Tell your care team if your symptoms do not start to get better or if they get worse. You may need blood work done while you are taking this medication. You may need to follow a special diet. Talk to your care team. Foods that contain iron include: whole grains/cereals, dried fruits, beans, or peas, leafy green vegetables, and organ meats (liver, kidney). What side effects may I notice from receiving this medication? Side effects that you should report to your care team as soon as possible: Allergic reactions--skin rash, itching, hives, swelling of the face, lips, tongue, or throat Low blood pressure--dizziness, feeling faint or lightheaded, blurry vision Shortness of breath Side effects that usually do not require medical attention (report to your care team if they continue or are bothersome): Flushing Headache Joint pain Muscle pain Nausea Pain, redness, or irritation at injection site This list may not describe all possible side effects. Call your doctor for medical advice about side effects. You may report side effects to FDA at 1-800-FDA-1088. Where should I keep my medication? This medication is given in a hospital or clinic. It will not be stored at home. NOTE: This sheet is a summary. It may not cover all possible information. If you have questions about this medicine, talk to your doctor, pharmacist, or health care provider.  2024 Elsevier/Gold Standard (2022-09-16 00:00:00)

## 2023-02-14 ENCOUNTER — Encounter: Payer: Self-pay | Admitting: Physician Assistant

## 2023-02-14 ENCOUNTER — Ambulatory Visit: Payer: Medicare Other | Attending: Physician Assistant | Admitting: Physician Assistant

## 2023-02-14 VITALS — BP 136/64 | HR 68 | Ht 66.0 in | Wt 154.4 lb

## 2023-02-14 DIAGNOSIS — M542 Cervicalgia: Secondary | ICD-10-CM | POA: Diagnosis not present

## 2023-02-14 DIAGNOSIS — D649 Anemia, unspecified: Secondary | ICD-10-CM | POA: Diagnosis not present

## 2023-02-14 DIAGNOSIS — R0600 Dyspnea, unspecified: Secondary | ICD-10-CM | POA: Diagnosis not present

## 2023-02-14 DIAGNOSIS — I10 Essential (primary) hypertension: Secondary | ICD-10-CM

## 2023-02-14 DIAGNOSIS — R079 Chest pain, unspecified: Secondary | ICD-10-CM

## 2023-02-14 MED ORDER — METOPROLOL SUCCINATE ER 25 MG PO TB24: ORAL_TABLET | ORAL | 3 refills | Status: DC

## 2023-02-14 MED ORDER — AMLODIPINE BESYLATE 5 MG PO TABS: 5.0000 mg | ORAL_TABLET | Freq: Every day | ORAL | 3 refills | Status: DC

## 2023-02-14 NOTE — Progress Notes (Unsigned)
Cardiology Office Note:  .   Date:  02/14/2023  ID:  Belinda Harper, DOB February 20, 1941, MRN [id number] PCP: Madelin Headings, MD  [facility] Providers Cardiologist:  Chrystie Nose, MD { Click to update primary MD,subspecialty MD or APP then REFRESH:1}   History of Present Illness: [name]   Belinda Harper is a 82 y.o. female with a hx of hypertension and hyperlipidemia.  She was remotely seen by Dr. Jens Som in 2014 for dyspnea.  Myoview at the time was negative with EF of 68%.  She has a family history of coronary artery disease with her father having 5 vessel CABG. Patient was seen by Dr. Rennis Golden in November 2021 with intermittent palpitation with bradycardia in the 30s.  There was no evidence of bradycardia on the EKG.  She was not on any AV nodal blocking agent.  A 2-week heart monitor was recommended.  Heart monitor showed multiple SVT episodes which are likely the cause of her palpitation.  Slowest heart rate was in the upper 40s and this occurred at night.  Therefore Dr. Rennis Golden recommended at 12.5 mg daily of Toprol-XL at night to suppress her palpitation.  She has been followed by hematology service for anemia.  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  She has chronic intermittent leukopenia since 2007.  She also had postmenopausal vaginal bleeding and followed by GYN service.  She was last seen in October 2023 at which time she was doing well.  More recently, patient has been followed by hematology service again in September 2024, with recent decrease of hemoglobin, there was concern of ongoing bleeding.  She has been transfused with iron infusion.  Patient presents today for follow-up.  Her anemia is worse this year, hemoglobin is now between 8-9 compared to around 11 last year.  With worsening anemia, she does have increased fatigue and the shortness of breath with exertion but no chest pain.  EKG is stable.  I  recommended that she continue on the current therapy.  Suspicion for cardiac causes behind her symptom is fairly low.  She is aware that with severe anemia, heart failure may occur.  However, on exam, she does not have any heart failure symptoms.  She can follow-up with cardiology service in 1 year.  ROS: ***  Studies Reviewed: .        *** Risk Assessment/Calculations:   {Does this patient have ATRIAL FIBRILLATION?:[phone]}         Physical Exam:   VS:  BP 136/64 (BP Location: Left Arm, Patient Position: Sitting, Cuff Size: Normal)   Pulse 68   Ht 5\' 6"  (1.676 m)   Wt 154 lb 6.4 oz (70 kg)   SpO2 96%   BMI 24.92 kg/m    Wt Readings from Last 3 Encounters:  02/14/23 154 lb 6.4 oz (70 kg)  01/19/23 154 lb 12.8 oz (70.2 kg)  01/18/23 156 lb (70.8 kg)    GEN: Well nourished, well developed in no acute distress NECK: No JVD; No carotid bruits CARDIAC: ***RRR, no murmurs, rubs, gallops RESPIRATORY:  Clear to auscultation without rales, wheezing or rhonchi  ABDOMEN: Soft, non-tender, non-distended EXTREMITIES:  No edema; No deformity   ASSESSMENT AND PLAN: .   ***    {Are you ordering a CV Procedure (e.g. stress test, cath, DCCV, TEE, etc)?   Press F2        :[id number]}  Dispo: ***  Signed, Azalee Course, PA

## 2023-02-14 NOTE — Patient Instructions (Signed)
Medication Instructions:  NO CHANGES *If you need a refill on your cardiac medications before your next appointment, please call your pharmacy*   Lab Work: NO LABS If you have labs (blood work) drawn today and your tests are completely normal, you will receive your results only by: MyChart Message (if you have MyChart) OR A paper copy in the mail If you have any lab test that is abnormal or we need to change your treatment, we will call you to review the results.   Testing/Procedures: NO TESTING   Follow-Up: At [facility], you and your health needs are our priority.  As part of our continuing mission to provide you with exceptional heart care, we have created designated Provider Care Teams.  These Care Teams include your primary Cardiologist (physician) and Advanced Practice Providers (APPs -  Physician Assistants and Nurse Practitioners) who all work together to provide you with the care you need, when you need it.   Your next appointment:   1 year(s)  Provider:   Chrystie Nose, MD

## 2023-02-18 ENCOUNTER — Ambulatory Visit: Payer: Medicare Other

## 2023-02-21 NOTE — Progress Notes (Signed)
GYNECOLOGY  VISIT   HPI: 82 y.o.   Divorced  Caucasian  female   [phone] with No LMP recorded. Patient is postmenopausal.   here for   4 mo pessary check.  Using #3 ring with support pessary for uterine prolapse. Her #4 pessary was causing vaginal erosion.  Currently has a little bit of blood every 3 - 4 weeks.  No pain.  Some vaginal discharge.   Can have urgency if waits too long to void.  No leak with cough, laugh or sneeze.   Has hemorrhoids.   Hx anemia.   Divorced.  2 children: one son in [place], other son in [place].  Has 3 grandchildren.  Has Sofie the cat.   GYNECOLOGIC HISTORY: No LMP recorded. Patient is postmenopausal. Contraception:  PMP Menopausal hormone therapy:  n/a Last mammogram:  01/10/23 Breast Density Cat C, BI-RADS CAT 1 neg Last pap smear:   06/21/18 WNL        OB History     Gravida  3   Para  2   Term  2   Preterm      AB  1   Living  2      SAB  1   IAB      Ectopic      Multiple      Live Births                 Patient Active Problem List   Diagnosis Date Noted   Vaginal bleeding 12/13/2021   Bilateral leg edema 05/17/2021   Deficiency anemia 12/16/2019   Abnormal LFTs new 10/01/2014   Other hemorrhoids 12/19/2013   Pelvic prolapse 12/19/2013   Medicare annual wellness visit, initial 12/18/2013   Vertigo, peripheral 11/21/2012   Elevated blood pressure reading 11/21/2012   Dyspnea 10/11/2012   Chest pain 09/19/2012   Decreased exercise tolerance 08/21/2012   Nodule of finger 08/21/2012   Hx of extrinsic asthma 08/21/2012   Leukopenia 07/24/2012   Anemia, normocytic normochromic 07/24/2012   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   Deficiency anemia 10/04/2011   Ankle pain 08/29/2011   Abnormal gait 08/29/2011   Numbness of foot 08/29/2011   Leg length discrepancy 08/07/2011   Scoliosis 08/07/2011   Hx of fall 08/07/2011   Medicare annual wellness visit, subsequent 08/07/2011   Hematuria  02/23/2011   Rectal prolapse 07/03/2010   Visit for preventive health examination 06/28/2010   MALAISE AND FATIGUE 01/27/2010   NUMBNESS 01/27/2010   HYPERCHOLESTEROLEMIA 05/19/2008   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   Hemorrhoids 05/19/2008   UNSPECIFIED VITAMIN D DEFICIENCY 04/06/2007   Uterine prolapse 04/06/2007   Symptomatic anemia 01/03/2007   DEPRESSION 01/03/2007   ALLERGIC RHINITIS 01/03/2007   ASTHMA 01/03/2007   OSTEOARTHRITIS 01/03/2007   Osteoporosis 01/03/2007    Past Medical History:  Diagnosis Date   Allergic rhinitis    Anxiety    Bradycardia    Chronic constipation    Chronic gastritis    followed by dr Lavon Paganini (GI);  long hx erosive gastritis with bleed   Depression    Family history of adverse reaction to anesthesia    pt's mother as she gets older organs shut down    GERD (gastroesophageal reflux disease)    Heart palpitations    cardiologist-- dr Rennis Golden;   palpitations w/ intermittant bradycardia;  event monitor 04-01-2020 multiple SVT episodes   Hemorrhoids    s/p banding 05-07-2021 by dr Lavon Paganini  History of asthma    childhood   History of sepsis 02/05/2015   sepsis secondart to cellulitis , left leg/ ankle due to retained ankle hardware   Hyperlipidemia    Hypertension    followed by pcp and cardiology;   normal nuclear stress test 09-28-2012   Iron deficiency anemia secondary to blood loss (chronic)    oncologist--- dr Bertis Ruddy;  long hx anemia secondary gi blood loss;  treated with transfusion's and IV iron   Leukopenia    chronic intermittantly --- followed by dr Bertis Ruddy   OA (osteoarthritis)    Osteoporosis    Presence of pessary    PSVT (paroxysmal supraventricular tachycardia) (HCC)    Rectal bleeding    Uterine prolapse    per pt uses pessary   Wears glasses     Past Surgical History:  Procedure Laterality Date   CATARACT EXTRACTION W/ INTRAOCULAR LENS IMPLANT Bilateral    right 2000;  left 2020    COLONOSCOPY WITH ESOPHAGOGASTRODUODENOSCOPY (EGD)  12/15/2020   by dr Lavon Paganini   HARDWARE REMOVAL Left 02/21/2015   Procedure: IRRIGATION AND DEBRIDEMENT HARDWARE REMOVAL LEFT ANKLE ;  Surgeon: Ollen Gross, MD;  Location: [facility];  Service: Orthopedics;  Laterality: Left;   MASS EXCISION Left 09/11/2012   Procedure: 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; Rotation Flap;  Surgeon: Nicki Reaper, MD;  Location: [facility];  Service: Orthopedics;  Laterality: Left;   ORIF ANKLE FRACTURE Left 03/21/2009   @[facility]   by dr Lequita Halt;   fracture and dislocation   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 90 tablet 3   Calcium Citrate-Vitamin D (CITRACAL + D PO) Take 2 tablets by mouth in the morning and at bedtime. Take 2 tablets po a.m and at lunch. 800 mg calcium     DARBEPOETIN ALFA IJ Inject as directed as needed.     denosumab (PROLIA) 60 MG/ML SOSY injection Inject 60 mg into the skin every 6 (six) months.     docusate sodium (COLACE) 100 MG capsule Take 100 mg by mouth every morning.     estradiol (ESTRACE) 0.1 MG/GM vaginal cream Use 1/2 g vaginally every night for the first 2 weeks, then use 1/2 g vaginally three times per week. 42.5 g 1   Magnesium Glycinate 665 MG CAPS Take 665 mg by mouth daily.     Menaquinone-7 (K2 PO) Take by mouth.     metoprolol succinate (TOPROL-XL) 25 MG 24 hr tablet TAKE 1/2 TABLET (12.5 MG TOTAL) BY MOUTH AT BEDTIME *SCHEDULE APPOINTMENT FOR FURTHER REFILLS* 45 tablet 3   Wheat Dextrin (BENEFIBER DRINK MIX PO) Take 1 Scoop by mouth daily.     No  current facility-administered medications for this visit.     ALLERGIES: Sulfamethoxazole, Nsaids, Sulfa antibiotics, and Yellow jacket venom  Family History  Problem Relation Age of Onset   Hyperlipidemia Mother    Heart disease Mother        CABG age 23   Ovarian cancer Mother    Uterine cancer Mother    Arthritis Mother    Osteoporosis Mother  Heart disease Father        CABG age 59   Arthritis Father    Melanoma Father     Social History   Socioeconomic History   Marital status: Divorced    Spouse name: Not on file   Number of children: 2   Years of education: Not on file   Highest education level: Not on file  Occupational History   Occupation: retired  Tobacco Use   Smoking status: Never   Smokeless tobacco: Never  Vaping Use   Vaping status: Never Used  Substance and Sexual Activity   Alcohol use: Not Currently   Drug use: No   Sexual activity: Not Currently    Birth control/protection: Post-menopausal    Comment: 1st intercourse- 18, partners- 1   Other Topics Concern   Not on file  Social History Narrative   HH of  1   To rent out room    Retired Artist   Never smoked    Pet cat allergic    Divorced   Regular exericise  walking mowing the lawnswimming   Silver sneakers    Social Determinants of Health   Financial Resource Strain: Low Risk  (01/14/2023)   Overall Financial Resource Strain (CARDIA)    Difficulty of Paying Living Expenses: Not hard at all  Food Insecurity: No Food Insecurity (01/14/2023)   Hunger Vital Sign    Worried About Running Out of Food in the Last Year: Never true    Ran Out of Food in the Last Year: Never true  Transportation Needs: No Transportation Needs (01/14/2023)   PRAPARE - Administrator, Civil Service (Medical): No    Lack of Transportation (Non-Medical): No  Physical Activity: Insufficiently Active (01/14/2023)   Exercise Vital Sign    Days of Exercise per Week: 2 days    Minutes of  Exercise per Session: 30 min  Stress: No Stress Concern Present (01/14/2023)   [employer] of Occupational Health - Occupational Stress Questionnaire    Feeling of Stress : Only a little  Social Connections: Unknown (01/14/2023)   Social Connection and Isolation Panel [NHANES]    Frequency of Communication with Friends and Family: More than three times a week    Frequency of Social Gatherings with Friends and Family: More than three times a week    Attends Religious Services: Not on Marketing executive or Organizations: No    Attends Banker Meetings: Not on file    Marital Status: Divorced  Intimate Partner Violence: Not At Risk (01/18/2023)   Humiliation, Afraid, Rape, and Kick questionnaire    Fear of Current or Ex-Partner: No    Emotionally Abused: No    Physically Abused: No    Sexually Abused: No    Review of Systems  All other systems reviewed and are negative.   PHYSICAL EXAMINATION:    BP 136/80 (BP Location: Left Arm, Patient Position: Sitting, Cuff Size: Normal)   Pulse 65   Ht 5\' 6"  (1.676 m)   Wt 158 lb (71.7 kg)   SpO2 98%   BMI 25.50 kg/m     General appearance: alert, cooperative and appears stated age  Pelvic: External genitalia:  no lesions              Urethra:  normal appearing urethra with no masses, tenderness or lesions              Bartholins and Skenes: normal  Vagina: prominent bilateral vaginal wall ulcerations noted.               Cervix: no lesions                Bimanual Exam:  Uterus:  normal size, contour, position, consistency, mobility, non-tender              Adnexa: no mass, fullness, tenderness                       Anus:  hemorrhoid noted.   Pessary removed, cleansed, and given to the patient in a biobag.   Chaperone was present for exam:  Warren Lacy, CMA  ASSESSMENT  Uterine prolapse.  Pessary maintenance.  Vaginal ulcerations.   PLAN  I recommend leaving the pessary out for at  least 3 weeks. We discussed vaginal ulcerations and rare risk of fistula formation from pessary use.  Rx for vaginal estradiol cream, 1/2 gram nightly for 2 weeks and then 3 times a week at hs. Return in 3 weeks for a recheck.   20 min  total time was spent for this patient encounter, including preparation, face-to-face counseling with the patient, coordination of care, and documentation of the encounter.

## 2023-02-25 ENCOUNTER — Inpatient Hospital Stay: Payer: Medicare Other | Attending: Hematology and Oncology

## 2023-02-25 VITALS — BP 154/60 | HR 53 | Temp 98.6°F | Resp 18

## 2023-02-25 DIAGNOSIS — D509 Iron deficiency anemia, unspecified: Secondary | ICD-10-CM | POA: Insufficient documentation

## 2023-02-25 DIAGNOSIS — D539 Nutritional anemia, unspecified: Secondary | ICD-10-CM

## 2023-02-25 DIAGNOSIS — D649 Anemia, unspecified: Secondary | ICD-10-CM

## 2023-02-25 MED ORDER — SODIUM CHLORIDE 0.9 % IV SOLN
400.0000 mg | Freq: Once | INTRAVENOUS | Status: AC
  Administered 2023-02-25: 400 mg via INTRAVENOUS
  Filled 2023-02-25: qty 400

## 2023-02-25 MED ORDER — SODIUM CHLORIDE 0.9 % IV SOLN
INTRAVENOUS | Status: DC
Start: 2023-02-25 — End: 2023-02-25

## 2023-02-25 NOTE — Patient Instructions (Signed)
Iron Sucrose Injection What is this medication? IRON SUCROSE (EYE ern SOO krose) treats low levels of iron (iron deficiency anemia) in people with kidney disease. Iron is a mineral that plays an important role in making red blood cells, which carry oxygen from your lungs to the rest of your body. This medicine may be used for other purposes; ask your health care provider or pharmacist if you have questions. COMMON BRAND NAME(S): Venofer What should I tell my care team before I take this medication? They need to know if you have any of these conditions: Anemia not caused by low iron levels Heart disease High levels of iron in the blood Kidney disease Liver disease An unusual or allergic reaction to iron, other medications, foods, dyes, or preservatives Pregnant or trying to get pregnant Breastfeeding How should I use this medication? This medication is for infusion into a vein. It is given in a hospital or clinic setting. Talk to your care team about the use of this medication in children. While this medication may be prescribed for children as young as 2 years for selected conditions, precautions do apply. Overdosage: If you think you have taken too much of this medicine contact a poison control center or emergency room at once. NOTE: This medicine is only for you. Do not share this medicine with others. What if I miss a dose? Keep appointments for follow-up doses. It is important not to miss your dose. Call your care team if you are unable to keep an appointment. What may interact with this medication? Do not take this medication with any of the following: Deferoxamine Dimercaprol Other iron products This medication may also interact with the following: Chloramphenicol Deferasirox This list may not describe all possible interactions. Give your health care provider a list of all the medicines, herbs, non-prescription drugs, or dietary supplements you use. Also tell them if you smoke,  drink alcohol, or use illegal drugs. Some items may interact with your medicine. What should I watch for while using this medication? Visit your care team regularly. Tell your care team if your symptoms do not start to get better or if they get worse. You may need blood work done while you are taking this medication. You may need to follow a special diet. Talk to your care team. Foods that contain iron include: whole grains/cereals, dried fruits, beans, or peas, leafy green vegetables, and organ meats (liver, kidney). What side effects may I notice from receiving this medication? Side effects that you should report to your care team as soon as possible: Allergic reactions--skin rash, itching, hives, swelling of the face, lips, tongue, or throat Low blood pressure--dizziness, feeling faint or lightheaded, blurry vision Shortness of breath Side effects that usually do not require medical attention (report to your care team if they continue or are bothersome): Flushing Headache Joint pain Muscle pain Nausea Pain, redness, or irritation at injection site This list may not describe all possible side effects. Call your doctor for medical advice about side effects. You may report side effects to FDA at 1-800-FDA-1088. Where should I keep my medication? This medication is given in a hospital or clinic. It will not be stored at home. NOTE: This sheet is a summary. It may not cover all possible information. If you have questions about this medicine, talk to your doctor, pharmacist, or health care provider.  2024 Elsevier/Gold Standard (2022-09-16 00:00:00)

## 2023-03-07 ENCOUNTER — Ambulatory Visit (INDEPENDENT_AMBULATORY_CARE_PROVIDER_SITE_OTHER): Payer: Medicare Other | Admitting: Obstetrics and Gynecology

## 2023-03-07 ENCOUNTER — Encounter: Payer: Self-pay | Admitting: Obstetrics and Gynecology

## 2023-03-07 VITALS — BP 136/80 | HR 65 | Ht 66.0 in | Wt 158.0 lb

## 2023-03-07 DIAGNOSIS — Z4689 Encounter for fitting and adjustment of other specified devices: Secondary | ICD-10-CM

## 2023-03-07 DIAGNOSIS — N814 Uterovaginal prolapse, unspecified: Secondary | ICD-10-CM | POA: Diagnosis not present

## 2023-03-07 DIAGNOSIS — N765 Ulceration of vagina: Secondary | ICD-10-CM

## 2023-03-07 MED ORDER — ESTRADIOL 0.1 MG/GM VA CREA: TOPICAL_CREAM | VAGINAL | 1 refills | Status: DC

## 2023-03-15 ENCOUNTER — Encounter: Payer: Self-pay | Admitting: Internal Medicine

## 2023-03-15 ENCOUNTER — Ambulatory Visit: Payer: Medicare Other

## 2023-03-15 ENCOUNTER — Ambulatory Visit: Payer: Medicare Other | Admitting: Internal Medicine

## 2023-03-15 VITALS — BP 130/70 | HR 57 | Temp 98.0°F | Ht 65.8 in | Wt 159.0 lb

## 2023-03-15 DIAGNOSIS — Z0000 Encounter for general adult medical examination without abnormal findings: Secondary | ICD-10-CM | POA: Diagnosis not present

## 2023-03-15 DIAGNOSIS — R062 Wheezing: Secondary | ICD-10-CM

## 2023-03-15 DIAGNOSIS — I10 Essential (primary) hypertension: Secondary | ICD-10-CM

## 2023-03-15 DIAGNOSIS — E785 Hyperlipidemia, unspecified: Secondary | ICD-10-CM

## 2023-03-15 DIAGNOSIS — Z79899 Other long term (current) drug therapy: Secondary | ICD-10-CM

## 2023-03-15 DIAGNOSIS — M810 Age-related osteoporosis without current pathological fracture: Secondary | ICD-10-CM

## 2023-03-15 NOTE — Patient Instructions (Addendum)
Consider seeing dermatology about the areas you peel off .  Suggest  use pill container to take meds helpful.  Will ask Delano Metz   pharmacist to contact you about  streaming how  to   help with meds.

## 2023-03-15 NOTE — Progress Notes (Unsigned)
GYNECOLOGY  VISIT   HPI: 82 y.o.   Divorced  Caucasian female   [phone] with No LMP recorded. Patient is postmenopausal.   here for: 3 week pessary check   Has a #3 ring with support for uterine prolapse and has bilateral vaginal ulceration noted at the time of her visit on 03/07/23.  Her pessary was left out.   She received Rx for vaginal estradiol.  Now using it every other day.    No vaginal bleeding.     GYNECOLOGIC HISTORY: No LMP recorded. Patient is postmenopausal. Contraception:  PMP Menopausal hormone therapy:  n/a Last 2 paps:  06/21/18 WNL  History of abnormal Pap or positive HPV:  no Mammogram:  01/10/23 Breast Density Cat C, BI-RADS CAT 1 neg         OB History     Gravida  3   Para  2   Term  2   Preterm      AB  1   Living  2      SAB  1   IAB      Ectopic      Multiple      Live Births                 Patient Active Problem List   Diagnosis Date Noted   Vaginal bleeding 12/13/2021   Bilateral leg edema 05/17/2021   Deficiency anemia 12/16/2019   Abnormal LFTs new 10/01/2014   Other hemorrhoids 12/19/2013   Pelvic prolapse 12/19/2013   Medicare annual wellness visit, initial 12/18/2013   Vertigo, peripheral 11/21/2012   Elevated blood pressure reading 11/21/2012   Dyspnea 10/11/2012   Chest pain 09/19/2012   Decreased exercise tolerance 08/21/2012   Nodule of finger 08/21/2012   Hx of extrinsic asthma 08/21/2012   Leukopenia 07/24/2012   Anemia, normocytic normochromic 07/24/2012   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   Deficiency anemia 10/04/2011   Ankle pain 08/29/2011   Abnormal gait 08/29/2011   Numbness of foot 08/29/2011   Leg length discrepancy 08/07/2011   Scoliosis 08/07/2011   Hx of fall 08/07/2011   Medicare annual wellness visit, subsequent 08/07/2011   Hematuria 02/23/2011   Rectal prolapse 07/03/2010   Visit for preventive health examination 06/28/2010   MALAISE AND FATIGUE 01/27/2010   NUMBNESS  01/27/2010   HYPERCHOLESTEROLEMIA 05/19/2008   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   Hemorrhoids 05/19/2008   UNSPECIFIED VITAMIN D DEFICIENCY 04/06/2007   Uterine prolapse 04/06/2007   Symptomatic anemia 01/03/2007   DEPRESSION 01/03/2007   ALLERGIC RHINITIS 01/03/2007   ASTHMA 01/03/2007   OSTEOARTHRITIS 01/03/2007   Osteoporosis 01/03/2007    Past Medical History:  Diagnosis Date   Allergic rhinitis    Anxiety    Bradycardia    Chronic constipation    Chronic gastritis    followed by dr Lavon Paganini (GI);  long hx erosive gastritis with bleed   Depression    Family history of adverse reaction to anesthesia    pt's mother as she gets older organs shut down    GERD (gastroesophageal reflux disease)    Heart palpitations    cardiologist-- dr Rennis Golden;   palpitations w/ intermittant bradycardia;  event monitor 04-01-2020 multiple SVT episodes   Hemorrhoids    s/p banding 05-07-2021 by dr Lavon Paganini   History of asthma    childhood   History of sepsis 02/05/2015   sepsis secondart to cellulitis , left leg/ ankle due to retained ankle  hardware   Hyperlipidemia    Hypertension    followed by pcp and cardiology;   normal nuclear stress test 09-28-2012   Iron deficiency anemia secondary to blood loss (chronic)    oncologist--- dr Bertis Ruddy;  long hx anemia secondary gi blood loss;  treated with transfusion's and IV iron   Leukopenia    chronic intermittantly --- followed by dr Bertis Ruddy   OA (osteoarthritis)    Osteoporosis    Presence of pessary    PSVT (paroxysmal supraventricular tachycardia) (HCC)    Rectal bleeding    Uterine prolapse    per pt uses pessary   Wears glasses     Past Surgical History:  Procedure Laterality Date   CATARACT EXTRACTION W/ INTRAOCULAR LENS IMPLANT Bilateral    right 2000;  left 2020   COLONOSCOPY WITH ESOPHAGOGASTRODUODENOSCOPY (EGD)  12/15/2020   by dr Lavon Paganini   HARDWARE REMOVAL Left 02/21/2015   Procedure: IRRIGATION  AND DEBRIDEMENT HARDWARE REMOVAL LEFT ANKLE ;  Surgeon: Ollen Gross, MD;  Location: [facility];  Service: Orthopedics;  Laterality: Left;   MASS EXCISION Left 09/11/2012   Procedure: 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; Rotation Flap;  Surgeon: Nicki Reaper, MD;  Location: [facility];  Service: Orthopedics;  Laterality: Left;   ORIF ANKLE FRACTURE Left 03/21/2009   @[facility]   by dr Lequita Halt;   fracture and dislocation   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 90 tablet 3   Calcium Citrate-Vitamin D (CITRACAL + D PO) Take 2 tablets by mouth in the morning and at bedtime. Take 2 tablets po a.m and at lunch. 800 mg calcium     DARBEPOETIN ALFA IJ Inject as directed as needed.     denosumab (PROLIA) 60 MG/ML SOSY injection Inject 60 mg into the skin every 6 (six) months.     docusate sodium (COLACE) 100 MG capsule Take 200 mg by mouth every morning.     estradiol (ESTRACE) 0.1 MG/GM vaginal cream Use 1/2 g vaginally every night for the first 2 weeks, then use 1/2 g vaginally three times per week. 42.5 g 1   Magnesium Glycinate 665 MG CAPS Take 665 mg by mouth daily.     Menaquinone-7 (K2 PO) Take by mouth.     metoprolol succinate (TOPROL-XL) 25 MG 24 hr tablet TAKE 1/2 TABLET (12.5 MG TOTAL) BY MOUTH AT BEDTIME *SCHEDULE APPOINTMENT FOR FURTHER REFILLS* 45 tablet 3   Wheat Dextrin (BENEFIBER DRINK MIX PO) Take 1 Scoop by mouth daily.     No current facility-administered medications for this visit.     ALLERGIES: Sulfamethoxazole, Nsaids, Sulfa antibiotics, and Yellow jacket  venom  Family History  Problem Relation Age of Onset   Hyperlipidemia Mother    Heart disease Mother        CABG age 64   Ovarian cancer Mother    Uterine cancer Mother    Arthritis Mother    Osteoporosis Mother    Heart disease Father        CABG age 24   Arthritis Father    Melanoma Father     Social  History   Socioeconomic History   Marital status: Divorced    Spouse name: Not on file   Number of children: 2   Years of education: Not on file   Highest education level: Some college, no degree  Occupational History   Occupation: retired  Tobacco Use   Smoking status: Never   Smokeless tobacco: Never  Vaping Use   Vaping status: Never Used  Substance and Sexual Activity   Alcohol use: Not Currently   Drug use: No   Sexual activity: Not Currently    Birth control/protection: Post-menopausal    Comment: 1st intercourse- 18, partners- 1   Other Topics Concern   Not on file  Social History Narrative   HH of  1   To rent out room    Retired Artist   Never smoked    Pet cat allergic    Divorced   Regular exericise  walking mowing the lawnswimming   Silver sneakers    Social Determinants of Health   Financial Resource Strain: Low Risk  (03/11/2023)   Overall Financial Resource Strain (CARDIA)    Difficulty of Paying Living Expenses: Not hard at all  Food Insecurity: No Food Insecurity (03/11/2023)   Hunger Vital Sign    Worried About Running Out of Food in the Last Year: Never true    Ran Out of Food in the Last Year: Never true  Transportation Needs: No Transportation Needs (03/11/2023)   PRAPARE - Administrator, Civil Service (Medical): No    Lack of Transportation (Non-Medical): No  Physical Activity: Insufficiently Active (03/11/2023)   Exercise Vital Sign    Days of Exercise per Week: 3 days    Minutes of Exercise per Session: 20 min  Stress: No Stress Concern Present (03/11/2023)   [employer] of Occupational Health -  Occupational Stress Questionnaire    Feeling of Stress : Only a little  Social Connections: Unknown (03/11/2023)   Social Connection and Isolation Panel [NHANES]    Frequency of Communication with Friends and Family: More than three times a week    Frequency of Social Gatherings with Friends and Family: More than three times a week    Attends Religious Services: Patient declined    Database administrator or Organizations: No    Attends Engineer, structural: Not on file    Marital Status: Divorced  Intimate Partner Violence: Not At Risk (01/18/2023)   Humiliation, Afraid, Rape, and Kick questionnaire    Fear of Current or Ex-Partner: No    Emotionally Abused: No    Physically Abused: No    Sexually Abused: No    Review of Systems  All other systems reviewed and are negative.   PHYSICAL EXAMINATION:   BP 122/70 (BP Location: Right Arm, Patient Position: Sitting, Cuff Size: Normal)   Pulse 66   Ht 5\' 6"  (1.676 m)   Wt 159 lb (72.1 kg)   SpO2 94%   BMI 25.66 kg/m     General appearance: alert, cooperative and appears stated age   Pelvic: External genitalia:  no lesions              Urethra:  normal appearing urethra with no masses, tenderness or lesions              Bartholins and Skenes: normal                 Vagina: posterior vaginal cuff with large area of granulation tissue and  still shallow ulceration of the bilateral apices.  Treated with AgNO3 after verbal permission.               Cervix: no lesions                Bimanual Exam:  Uterus:  normal size, contour, position, consistency, mobility, non-tender              Adnexa: no mass, fullness, tenderness          Chaperone was present for exam:  Warren Lacy, CMA  ASSESSMENT:  Uterine prolapse.  Vaginal granulation tissue and ulceration.   PLAN:  No cream in vagina.  Leave pessary out. FU 7 - 10 days.

## 2023-03-15 NOTE — Progress Notes (Signed)
Chief Complaint  Patient presents with   Annual Exam    HPI: Patient  Belinda Harper  82 y.o. comes in today for Preventive Health Care visit  And Chronic disease management   GI hx hemorroids  dr Maisie Fus  hx multiple  evals  in past for IDAnot active  Heme Gorsuch  sever iron def anemai on going   and followed   indusions and now darbopoetin  GU uterine prolapse  dr Vladimir Crofts pessary out for now cause of "irritation"  Cards in past palpititation  dr Lisabeth Devoid and fu on amlod and meto for bp and palitations  on 1 year fu  Bone health  on Prolia   Memory  about the same .  Not lost .  No financial missing famiy doesn't complain.  Sometime may be can't remember if took a med but is juggling many in between her supplements  To see periodontist When lays down at night hears wheeze in upper chest airway but no cough or sob or wheezing other times  no dysphagia   Health Maintenance  Topic Date Due   COVID-19 Vaccine (3 - Pfizer risk series) 03/31/2023 (Originally 04/01/2020)   INFLUENZA VACCINE  07/24/2023 (Originally 11/24/2022)   DTaP/Tdap/Td (3 - Tdap) 12/13/2023 (Originally 06/23/2019)   Medicare Annual Wellness (AWV)  01/18/2024   Pneumonia Vaccine 68+ Years old  Completed   DEXA SCAN  Completed   Zoster Vaccines- Shingrix  Completed   HPV VACCINES  Aged Out   Health Maintenance Review LIFESTYLE:  Exercise:  : not a whole lot  yard work  .  Tobacco/ETS: n Alcohol:  n Sugar beverages:  n Sleep:   6 hours or so  Drug use: no HH of  3   cat cta and dig  lives  over and son  Diet   trying to address     ROS:  feet feel wrapped .  GEN/ HEENT: No fever, significant weight changes sweats headaches vision problems hearing changes, CV/ PULM; No chest pain shortness of breath cough, syncope,edema  change in exercise tolerance. GI /GU: No adominal pain, vomiting, change in bowel habits. No blood in the stool.at current  SKIN/HEME: ,no acute skin rashes suspicious lesions or bleeding. No  lymphadenopathy, nodules, masses.  Has 2 area she scrapes off  r arm and back thinks SK  NEURO/ PSYCH:  No  new neurologic signs such as weakness numbness feet feel like wrapped but no progression. No depression anxiety. IMM/ Allergy: No unusual infections.  Allergy .   REST of 12 system review negative except as per HPI   Past Medical History:  Diagnosis Date   Allergic rhinitis    Anxiety    Bradycardia    Chronic constipation    Chronic gastritis    followed by dr Lavon Paganini (GI);  long hx erosive gastritis with bleed   Depression    Family history of adverse reaction to anesthesia    pt's mother as she gets older organs shut down    GERD (gastroesophageal reflux disease)    Heart palpitations    cardiologist-- dr Rennis Golden;   palpitations w/ intermittant bradycardia;  event monitor 04-01-2020 multiple SVT episodes   Hemorrhoids    s/p banding 05-07-2021 by dr Lavon Paganini   History of asthma    childhood   History of sepsis 02/05/2015   sepsis secondart to cellulitis , left leg/ ankle due to retained ankle hardware   Hyperlipidemia    Hypertension  followed by pcp and cardiology;   normal nuclear stress test 09-28-2012   Iron deficiency anemia secondary to blood loss (chronic)    oncologist--- dr Bertis Ruddy;  long hx anemia secondary gi blood loss;  treated with transfusion's and IV iron   Leukopenia    chronic intermittantly --- followed by dr Bertis Ruddy   OA (osteoarthritis)    Osteoporosis    Presence of pessary    PSVT (paroxysmal supraventricular tachycardia) (HCC)    Rectal bleeding    Uterine prolapse    per pt uses pessary   Wears glasses     Past Surgical History:  Procedure Laterality Date   CATARACT EXTRACTION W/ INTRAOCULAR LENS IMPLANT Bilateral    right 2000;  left 2020   COLONOSCOPY WITH ESOPHAGOGASTRODUODENOSCOPY (EGD)  12/15/2020   by dr Lavon Paganini   HARDWARE REMOVAL Left 02/21/2015   Procedure: IRRIGATION AND DEBRIDEMENT HARDWARE REMOVAL LEFT ANKLE ;  Surgeon:  Ollen Gross, MD;  Location: [facility];  Service: Orthopedics;  Laterality: Left;   MASS EXCISION Left 09/11/2012   Procedure: 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; Rotation Flap;  Surgeon: Nicki Reaper, MD;  Location: [facility];  Service: Orthopedics;  Laterality: Left;   ORIF ANKLE FRACTURE Left 03/21/2009   @[facility]   by dr Lequita Halt;   fracture and dislocation   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 , saw injury   TRANSANAL HEMORRHOIDAL DEARTERIALIZATION N/A 10/15/2021   Procedure: TRANSANAL HEMORRHOIDAL DEARTERIALIZATION;  Surgeon: Romie Levee, MD;  Location: [facility];  Service: General;  Laterality: N/A;    Family History  Problem Relation Age of Onset   Hyperlipidemia Mother    Heart disease Mother        CABG age 39   Ovarian cancer Mother    Uterine cancer Mother    Arthritis Mother    Osteoporosis Mother    Heart disease Father        CABG age 106   Arthritis Father    Melanoma Father     Social History   Socioeconomic History   Marital status: Divorced    Spouse name: Not on file   Number of children: 2   Years of education: Not on file   Highest education level: Some college, no degree  Occupational History   Occupation: retired  Tobacco Use   Smoking status: Never   Smokeless tobacco: Never  Vaping Use   Vaping status: Never Used  Substance and Sexual Activity   Alcohol use: Not Currently   Drug use: No   Sexual activity: Not Currently    Birth control/protection: Post-menopausal    Comment: 1st intercourse- 18, partners- 1   Other Topics Concern   Not on file  Social History Narrative   HH of  1   To rent out room    Retired Artist   Never smoked    Pet cat allergic    Divorced   Regular exericise  walking mowing the lawnswimming   Silver sneakers    Social Determinants of Health   Financial Resource Strain: Low Risk  (03/11/2023)   Overall Financial Resource Strain  (CARDIA)    Difficulty of Paying Living Expenses: Not hard at all  Food Insecurity: No Food Insecurity (03/11/2023)   Hunger Vital Sign    Worried About Running Out of Food in the Last Year: Never true    Ran Out of Food in the Last Year: Never true  Transportation  Needs: No Transportation Needs (03/11/2023)   PRAPARE - Administrator, Civil Service (Medical): No    Lack of Transportation (Non-Medical): No  Physical Activity: Insufficiently Active (03/11/2023)   Exercise Vital Sign    Days of Exercise per Week: 3 days    Minutes of Exercise per Session: 20 min  Stress: No Stress Concern Present (03/11/2023)   [employer] of Occupational Health - Occupational Stress Questionnaire    Feeling of Stress : Only a little  Social Connections: Unknown (03/11/2023)   Social Connection and Isolation Panel [NHANES]    Frequency of Communication with Friends and Family: More than three times a week    Frequency of Social Gatherings with Friends and Family: More than three times a week    Attends Religious Services: Patient declined    Database administrator or Organizations: No    Attends Engineer, structural: Not on file    Marital Status: Divorced    Outpatient Medications Prior to Visit  Medication Sig Dispense Refill   amLODipine (NORVASC) 5 MG tablet Take 1 tablet (5 mg total) by mouth daily. 90 tablet 3   Calcium Citrate-Vitamin D (CITRACAL + D PO) Take 2 tablets by mouth in the morning and at bedtime. Take 2 tablets po a.m and at lunch. 800 mg calcium     DARBEPOETIN ALFA IJ Inject as directed as needed.     denosumab (PROLIA) 60 MG/ML SOSY injection Inject 60 mg into the skin every 6 (six) months.     docusate sodium (COLACE) 100 MG capsule Take 200 mg by mouth every morning.     estradiol (ESTRACE) 0.1 MG/GM vaginal cream Use 1/2 g vaginally every night for the first 2 weeks, then use 1/2 g vaginally three times per week. 42.5 g 1   Magnesium Glycinate 665 MG  CAPS Take 665 mg by mouth daily.     Menaquinone-7 (K2 PO) Take by mouth.     metoprolol succinate (TOPROL-XL) 25 MG 24 hr tablet TAKE 1/2 TABLET (12.5 MG TOTAL) BY MOUTH AT BEDTIME *SCHEDULE APPOINTMENT FOR FURTHER REFILLS* 45 tablet 3   Wheat Dextrin (BENEFIBER DRINK MIX PO) Take 1 Scoop by mouth daily.     No facility-administered medications prior to visit.     EXAM:  BP 130/70 (BP Location: Left Arm, Patient Position: Sitting, Cuff Size: Normal)   Pulse (!) 57   Temp 98 F (36.7 C) (Oral)   Ht 5' 5.8" (1.671 m)   Wt 159 lb (72.1 kg)   SpO2 96%   BMI 25.82 kg/m   Body mass index is 25.82 kg/m. Wt Readings from Last 3 Encounters:  03/15/23 159 lb (72.1 kg)  03/07/23 158 lb (71.7 kg)  02/14/23 154 lb 6.4 oz (70 kg)    Physical Exam: Vital signs reviewed DGL:OVFI is a well-developed well-nourished alert cooperative    who appearsr stated age in no acute distress.  HEENT: normocephalic atraumatic , Eyes: PERRL EOM's full, conjunctiva clear, Nares: paten,t no deformity discharge or tenderness., Ears: no deformity EAC's clear TMs with normal landmarks. Mouth: clear OP, no lesions, edema.  Moist mucous membranes. Dentition in adequate repair. NECK: supple without masses, thyromegaly or bruits. CHEST/PULM:  Clear to auscultation and percussion breath sounds equal no wheeze , rales or rhonchi. No chest wall deformities or tenderness. Breast: normal by inspection . No dimpling, discharge, masses, tenderness or discharge . CV: PMI is nondisplaced, S1 S2 no gallops, murmurs, rubs. Peripheral pulses are full  without delay.No JVD .  ABDOMEN: Bowel sounds normal nontender  No guard or rebound, no hepato splenomegal no CVA tenderness.  Extremtities:  No clubbing cyanosis or edema, no acute joint swelling or redness no focal atrophy dec rom  ankle ( old trauma and surgery) NEURO:  Oriented x3, cranial nerves 3-12 appear to be intact, no obvious focal weakness,gait within normal limits no  abnormal reflexes or asymmetrical SKIN: No acute rashes normal turgor, color, no bruising or petechiae. R arm and upper back an excoriated pink base healing and left upper arm rough area flesh colored  PSYCH: Oriented, good eye contact, no obvious depression anxiety, cognition and judgment appear normal. LN: no cervical axillaryadenopathy  Lab Results  Component Value Date   WBC 3.5 (L) 01/12/2023   HGB 8.7 (L) 01/12/2023   HCT 29.6 (L) 01/12/2023   PLT 212 01/12/2023   GLUCOSE 73 11/18/2021   CHOL 251 (H) 03/02/2022   TRIG 150.0 (H) 03/02/2022   HDL 69.70 03/02/2022   LDLDIRECT 141.9 08/06/2012   LDLCALC 151 (H) 03/02/2022   ALT 15 12/09/2020   AST 25 12/09/2020   NA 141 11/18/2021   K 4.0 11/18/2021   CL 105 11/18/2021   CREATININE 0.92 11/18/2021   BUN 15 11/18/2021   CO2 32 11/18/2021   TSH 1.19 12/09/2020   INR 1.04 04/10/2009   HGBA1C 5.6 11/21/2018   Baked potato  butter and fish .  Fired decoated  1/2 12 tea  lemonade . [name]  BP Readings from Last 3 Encounters:  03/15/23 130/70  03/07/23 136/80  02/25/23 (!) 154/60    Lab plan  reviewed with patient  NF   ASSESSMENT AND PLAN:  Discussed the following assessment and plan:    ICD-10-CM   1. Visit for preventive health examination  Z00.00     2. Osteoporosis, unspecified osteoporosis type, unspecified pathological fracture presence  M81.0 Lipid panel    TSH    CBC with Differential/Platelet    Comprehensive metabolic panel    Hemoglobin A1c    DG Chest 2 View    3. Medication management  Z79.899 Lipid panel    TSH    CBC with Differential/Platelet    Comprehensive metabolic panel    Hemoglobin A1c    DG Chest 2 View    4. Hypertension, unspecified type  I10 Lipid panel    TSH    CBC with Differential/Platelet    Comprehensive metabolic panel    Hemoglobin A1c    DG Chest 2 View    5. Hyperlipidemia, unspecified hyperlipidemia type  E78.5 Lipid panel    TSH    CBC with Differential/Platelet     Comprehensive metabolic panel    Hemoglobin A1c    6. Wheeze  R06.2 Lipid panel    TSH    CBC with Differential/Platelet    Comprehensive metabolic panel    Hemoglobin A1c    DG Chest 2 View   see text uncertain if  signiricant sound when lays down at night but no  active  wheeze    ? Memory .  About the same no formal testing . Today   Return in about 6 months (around 09/12/2023) for depending on results 6-12 mos.  Patient Care Team: Ioanna Colquhoun, Neta Mends, MD as PCP - General (Internal Medicine) Rennis Golden Lisette Abu, MD as PCP - Cardiology (Cardiology) Ernesto Rutherford, MD (Ophthalmology) Filbert Berthold, DDS (Dentistry) Cherlyn Roberts, MD as Consulting Physician (Dermatology) [name], MD as Consulting Physician (Hematology and  Oncology) Ollen Gross, MD as Consulting Physician (Orthopedic Surgery) Patient Instructions  Consider seeing dermatology about the areas you peel off .  Suggest  use pill container to take meds helpful.  Will ask Delano Metz   pharmacist to contact you about  streaming how  to   help with meds.     Neta Mends. Keiran Gaffey M.D.

## 2023-03-16 ENCOUNTER — Telehealth: Payer: Self-pay

## 2023-03-16 DIAGNOSIS — Z79899 Other long term (current) drug therapy: Secondary | ICD-10-CM

## 2023-03-16 LAB — CBC WITH DIFFERENTIAL/PLATELET
Basophils Absolute: 0 10*3/uL (ref 0.0–0.1)
Basophils Relative: 0.7 % (ref 0.0–3.0)
Eosinophils Absolute: 0.2 10*3/uL (ref 0.0–0.7)
Eosinophils Relative: 4.5 % (ref 0.0–5.0)
HCT: 35.6 % — ABNORMAL LOW (ref 36.0–46.0)
Hemoglobin: 11.3 g/dL — ABNORMAL LOW (ref 12.0–15.0)
Lymphocytes Relative: 22.3 % (ref 12.0–46.0)
Lymphs Abs: 1 10*3/uL (ref 0.7–4.0)
MCHC: 31.6 g/dL (ref 30.0–36.0)
MCV: 84.7 fL (ref 78.0–100.0)
Monocytes Absolute: 0.3 10*3/uL (ref 0.1–1.0)
Monocytes Relative: 7.8 % (ref 3.0–12.0)
Neutro Abs: 2.9 10*3/uL (ref 1.4–7.7)
Neutrophils Relative %: 64.7 % (ref 43.0–77.0)
Platelets: 183 10*3/uL (ref 150.0–400.0)
RBC: 4.2 Mil/uL (ref 3.87–5.11)
RDW: 21.2 % — ABNORMAL HIGH (ref 11.5–15.5)
WBC: 4.4 10*3/uL (ref 4.0–10.5)

## 2023-03-16 LAB — COMPREHENSIVE METABOLIC PANEL
ALT: 13 U/L (ref 0–35)
AST: 25 U/L (ref 0–37)
Albumin: 4.2 g/dL (ref 3.5–5.2)
Alkaline Phosphatase: 79 U/L (ref 39–117)
BUN: 21 mg/dL (ref 6–23)
CO2: 30 meq/L (ref 19–32)
Calcium: 9.7 mg/dL (ref 8.4–10.5)
Chloride: 101 meq/L (ref 96–112)
Creatinine, Ser: 0.89 mg/dL (ref 0.40–1.20)
GFR: 60.93 mL/min (ref >60.00)
Glucose, Bld: 72 mg/dL (ref 70–99)
Potassium: 4 meq/L (ref 3.5–5.1)
Sodium: 138 meq/L (ref 135–145)
Total Bilirubin: 0.3 mg/dL (ref 0.2–1.2)
Total Protein: 7.6 g/dL (ref 6.0–8.3)

## 2023-03-16 LAB — LIPID PANEL
Cholesterol: 245 mg/dL — ABNORMAL HIGH (ref 0–200)
HDL: 63.8 mg/dL (ref >39.00)
LDL Cholesterol: 160 mg/dL — ABNORMAL HIGH (ref 0–99)
NonHDL: 181
Total CHOL/HDL Ratio: 4
Triglycerides: 104 mg/dL (ref 0.0–149.0)
VLDL: 20.8 mg/dL (ref 0.0–40.0)

## 2023-03-16 LAB — HEMOGLOBIN A1C: Hgb A1c MFr Bld: 5.4 % (ref 4.6–6.5)

## 2023-03-16 LAB — TSH: TSH: 1.86 u[IU]/mL (ref 0.35–5.50)

## 2023-03-16 NOTE — Progress Notes (Signed)
   03/16/2023  Patient ID: Belinda Harper, female   DOB: 05/07/1940, 82 y.o.   MRN: [id number]  Placing a referral at request of PCP for medication management. Scheduling team will reach out to establish appt.  Sherrill Raring, PharmD Clinical Pharmacist [phone]

## 2023-03-16 NOTE — Progress Notes (Signed)
   Care Guide Note  03/16/2023 Name: FERRAN CENCI MRN: [id number] DOB: [birth year]  Referred by: Madelin Headings, MD Reason for referral : Care Coordination (Outreach to schedule with Pharm d )   KAMMY MANCIL is a 82 y.o. year old female who is a primary care patient of Panosh, Neta Mends, MD. Lauraine Rinne was referred to the pharmacist for assistance related to  med mgmt  .    Successful contact was made with the patient to discuss pharmacy services including being ready for the pharmacist to call at least 5 minutes before the scheduled appointment time, to have medication bottles and any blood sugar or blood pressure readings ready for review. The patient agreed to meet with the pharmacist via with the pharmacist via telephone visit on (date/time).  04/05/2023  Penne Lash , RMA     [place]  [facility], [facility] Guide  Direct Dial: [phone]  Website: [email or web address]

## 2023-03-28 ENCOUNTER — Other Ambulatory Visit: Payer: Self-pay | Admitting: Internal Medicine

## 2023-03-28 NOTE — Telephone Encounter (Signed)
Okay to schedule on or after 12/24

## 2023-03-29 ENCOUNTER — Ambulatory Visit (INDEPENDENT_AMBULATORY_CARE_PROVIDER_SITE_OTHER): Payer: Medicare Other | Admitting: Obstetrics and Gynecology

## 2023-03-29 ENCOUNTER — Encounter: Payer: Self-pay | Admitting: Obstetrics and Gynecology

## 2023-03-29 VITALS — BP 122/70 | HR 66 | Ht 66.0 in | Wt 159.0 lb

## 2023-03-29 DIAGNOSIS — N814 Uterovaginal prolapse, unspecified: Secondary | ICD-10-CM | POA: Diagnosis not present

## 2023-03-29 DIAGNOSIS — N898 Other specified noninflammatory disorders of vagina: Secondary | ICD-10-CM | POA: Diagnosis not present

## 2023-03-29 NOTE — Telephone Encounter (Signed)
Contacted pt and scheduled a nurse visit  for prolia injection on 04/20/2023.

## 2023-04-03 NOTE — Progress Notes (Signed)
Chest x ray  shows  no acute findings but some chronic changes which could be consitent with emphysema .   We can consider adding  inhalers   but also consider  seeing pulmonary team  for opinion. Let us know if you want  to proceed to seeing pulmonary provider  and then we can   do referral. Otherwise  make a follow up  appt to discuss

## 2023-04-05 ENCOUNTER — Telehealth: Payer: Self-pay | Admitting: Internal Medicine

## 2023-04-05 ENCOUNTER — Other Ambulatory Visit: Payer: Medicare Other

## 2023-04-05 NOTE — Telephone Encounter (Signed)
Pt returning call, says it's regarding labs

## 2023-04-05 NOTE — Progress Notes (Signed)
   04/05/2023  Patient ID: Belinda Harper, female   DOB: [birth year], 82 y.o.   MRN: [id number]  Contacted patient via telephone at the request of PCP for medication management.  Patient specifically requesting a review of her medications, including OTC supplements, to ensure safety of use in combo, and correct timing of medications to ensure proper absorption.  Reviewed all of the above with patient and ensured medication list was accurate and up to date. Instructed to follow up if any further questions/concerns or any additions or changes in OTC items/prescriptions.  Sherrill Raring, PharmD Clinical Pharmacist [phone]

## 2023-04-06 ENCOUNTER — Inpatient Hospital Stay: Payer: Medicare Other | Attending: Hematology and Oncology

## 2023-04-06 DIAGNOSIS — D509 Iron deficiency anemia, unspecified: Secondary | ICD-10-CM | POA: Insufficient documentation

## 2023-04-06 DIAGNOSIS — K5730 Diverticulosis of large intestine without perforation or abscess without bleeding: Secondary | ICD-10-CM | POA: Diagnosis not present

## 2023-04-06 DIAGNOSIS — D72819 Decreased white blood cell count, unspecified: Secondary | ICD-10-CM | POA: Insufficient documentation

## 2023-04-06 DIAGNOSIS — Z882 Allergy status to sulfonamides status: Secondary | ICD-10-CM | POA: Diagnosis not present

## 2023-04-06 DIAGNOSIS — R5383 Other fatigue: Secondary | ICD-10-CM | POA: Insufficient documentation

## 2023-04-06 DIAGNOSIS — K921 Melena: Secondary | ICD-10-CM | POA: Insufficient documentation

## 2023-04-06 DIAGNOSIS — K648 Other hemorrhoids: Secondary | ICD-10-CM | POA: Insufficient documentation

## 2023-04-06 DIAGNOSIS — D500 Iron deficiency anemia secondary to blood loss (chronic): Secondary | ICD-10-CM

## 2023-04-06 DIAGNOSIS — K2100 Gastro-esophageal reflux disease with esophagitis, without bleeding: Secondary | ICD-10-CM | POA: Diagnosis not present

## 2023-04-06 DIAGNOSIS — R062 Wheezing: Secondary | ICD-10-CM | POA: Insufficient documentation

## 2023-04-06 DIAGNOSIS — Z886 Allergy status to analgesic agent status: Secondary | ICD-10-CM | POA: Insufficient documentation

## 2023-04-06 DIAGNOSIS — Z79899 Other long term (current) drug therapy: Secondary | ICD-10-CM | POA: Insufficient documentation

## 2023-04-06 LAB — FERRITIN: Ferritin: 129 ng/mL (ref 11–307)

## 2023-04-06 LAB — CBC WITH DIFFERENTIAL/PLATELET
Abs Immature Granulocytes: 0.01 10*3/uL (ref 0.00–0.07)
Basophils Absolute: 0 10*3/uL (ref 0.0–0.1)
Basophils Relative: 1 %
Eosinophils Absolute: 0.2 10*3/uL (ref 0.0–0.5)
Eosinophils Relative: 6 %
HCT: 35.2 % — ABNORMAL LOW (ref 36.0–46.0)
Hemoglobin: 10.8 g/dL — ABNORMAL LOW (ref 12.0–15.0)
Immature Granulocytes: 0 %
Lymphocytes Relative: 37 %
Lymphs Abs: 1.3 10*3/uL (ref 0.7–4.0)
MCH: 27.1 pg (ref 26.0–34.0)
MCHC: 30.7 g/dL (ref 30.0–36.0)
MCV: 88.2 fL (ref 80.0–100.0)
Monocytes Absolute: 0.4 10*3/uL (ref 0.1–1.0)
Monocytes Relative: 11 %
Neutro Abs: 1.6 10*3/uL — ABNORMAL LOW (ref 1.7–7.7)
Neutrophils Relative %: 45 %
Platelets: 213 10*3/uL (ref 150–400)
RBC: 3.99 MIL/uL (ref 3.87–5.11)
RDW: 19.9 % — ABNORMAL HIGH (ref 11.5–15.5)
WBC: 3.6 10*3/uL — ABNORMAL LOW (ref 4.0–10.5)
nRBC: 0 % (ref 0.0–0.2)

## 2023-04-06 LAB — IRON AND IRON BINDING CAPACITY (CC-WL,HP ONLY)
Iron: 47 ug/dL (ref 28–170)
Saturation Ratios: 15 % (ref 10.4–31.8)
TIBC: 315 ug/dL (ref 250–450)
UIBC: 268 ug/dL (ref 148–442)

## 2023-04-06 NOTE — Telephone Encounter (Signed)
Attempted to reach pt. Left a voicemail to call us back.  

## 2023-04-10 ENCOUNTER — Encounter: Payer: Self-pay | Admitting: Obstetrics and Gynecology

## 2023-04-10 ENCOUNTER — Other Ambulatory Visit: Payer: Self-pay

## 2023-04-10 ENCOUNTER — Ambulatory Visit (INDEPENDENT_AMBULATORY_CARE_PROVIDER_SITE_OTHER): Payer: Medicare Other | Admitting: Obstetrics and Gynecology

## 2023-04-10 VITALS — BP 116/64 | HR 62 | Ht 66.0 in | Wt 159.0 lb

## 2023-04-10 DIAGNOSIS — N765 Ulceration of vagina: Secondary | ICD-10-CM

## 2023-04-10 DIAGNOSIS — Z4689 Encounter for fitting and adjustment of other specified devices: Secondary | ICD-10-CM | POA: Diagnosis not present

## 2023-04-10 DIAGNOSIS — R062 Wheezing: Secondary | ICD-10-CM

## 2023-04-10 NOTE — Progress Notes (Signed)
GYNECOLOGY  VISIT   HPI: 82 y.o.   Divorced  Caucasian female   [phone] with No LMP recorded. Patient is postmenopausal.   here for: pessary recheck   Having to push up her prolapse to void without her pessary in place.  She uses a #3 ring with support.   She is followed for bilateral vaginal ulceration and is using vaginal estradiol cream.   No bleeding.     Happy with her pessary overall.   GYNECOLOGIC HISTORY: No LMP recorded. Patient is postmenopausal. Contraception:  PMP Menopausal hormone therapy:  N/A Last 2 paps:  06/21/20 WNL History of abnormal Pap or positive HPV:  no Mammogram:  01/10/23 Breast Density Cat C, BI-RADS CAT 1 neg                OB History     Gravida  3   Para  2   Term  2   Preterm      AB  1   Living  2      SAB  1   IAB      Ectopic      Multiple      Live Births                 Patient Active Problem List   Diagnosis Date Noted   Vaginal bleeding 12/13/2021   Bilateral leg edema 05/17/2021   Deficiency anemia 12/16/2019   Abnormal LFTs new 10/01/2014   Other hemorrhoids 12/19/2013   Pelvic prolapse 12/19/2013   Medicare annual wellness visit, initial 12/18/2013   Vertigo, peripheral 11/21/2012   Elevated blood pressure reading 11/21/2012   Dyspnea 10/11/2012   Chest pain 09/19/2012   Decreased exercise tolerance 08/21/2012   Nodule of finger 08/21/2012   Hx of extrinsic asthma 08/21/2012   Leukopenia 07/24/2012   Anemia, normocytic normochromic 07/24/2012   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   Deficiency anemia 10/04/2011   Ankle pain 08/29/2011   Abnormal gait 08/29/2011   Numbness of foot 08/29/2011   Leg length discrepancy 08/07/2011   Scoliosis 08/07/2011   Hx of fall 08/07/2011   Medicare annual wellness visit, subsequent 08/07/2011   Hematuria 02/23/2011   Rectal prolapse 07/03/2010   Visit for preventive health examination 06/28/2010   MALAISE AND FATIGUE 01/27/2010   NUMBNESS 01/27/2010    HYPERCHOLESTEROLEMIA 05/19/2008   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   Hemorrhoids 05/19/2008   UNSPECIFIED VITAMIN D DEFICIENCY 04/06/2007   Uterine prolapse 04/06/2007   Symptomatic anemia 01/03/2007   DEPRESSION 01/03/2007   ALLERGIC RHINITIS 01/03/2007   ASTHMA 01/03/2007   OSTEOARTHRITIS 01/03/2007   Osteoporosis 01/03/2007    Past Medical History:  Diagnosis Date   Allergic rhinitis    Anxiety    Bradycardia    Chronic constipation    Chronic gastritis    followed by dr Lavon Paganini (GI);  long hx erosive gastritis with bleed   Depression    Family history of adverse reaction to anesthesia    pt's mother as she gets older organs shut down    GERD (gastroesophageal reflux disease)    Heart palpitations    cardiologist-- dr Rennis Golden;   palpitations w/ intermittant bradycardia;  event monitor 04-01-2020 multiple SVT episodes   Hemorrhoids    s/p banding 05-07-2021 by dr Lavon Paganini   History of asthma    childhood   History of sepsis 02/05/2015   sepsis secondart to cellulitis , left leg/ ankle due to retained  ankle hardware   Hyperlipidemia    Hypertension    followed by pcp and cardiology;   normal nuclear stress test 09-28-2012   Iron deficiency anemia secondary to blood loss (chronic)    oncologist--- dr Bertis Ruddy;  long hx anemia secondary gi blood loss;  treated with transfusion's and IV iron   Leukopenia    chronic intermittantly --- followed by dr Bertis Ruddy   OA (osteoarthritis)    Osteoporosis    Presence of pessary    PSVT (paroxysmal supraventricular tachycardia) (HCC)    Rectal bleeding    Uterine prolapse    per pt uses pessary   Wears glasses     Past Surgical History:  Procedure Laterality Date   CATARACT EXTRACTION W/ INTRAOCULAR LENS IMPLANT Bilateral    right 2000;  left 2020   COLONOSCOPY WITH ESOPHAGOGASTRODUODENOSCOPY (EGD)  12/15/2020   by dr Lavon Paganini   HARDWARE REMOVAL Left 02/21/2015   Procedure: IRRIGATION AND  DEBRIDEMENT HARDWARE REMOVAL LEFT ANKLE ;  Surgeon: Ollen Gross, MD;  Location: [facility];  Service: Orthopedics;  Laterality: Left;   MASS EXCISION Left 09/11/2012   Procedure: 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; Rotation Flap;  Surgeon: Nicki Reaper, MD;  Location: [facility];  Service: Orthopedics;  Laterality: Left;   ORIF ANKLE FRACTURE Left 03/21/2009   @[facility]   by dr Lequita Halt;   fracture and dislocation   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 90 tablet 3   Calcium Citrate-Vitamin D (CITRACAL + D PO) Take 2 tablets by mouth in the morning and at bedtime. Take 2 tablets po a.m and at lunch. 800 mg calcium     DARBEPOETIN ALFA IJ Inject as directed as needed.     denosumab (PROLIA) 60 MG/ML SOSY injection Inject 60 mg into the skin every 6 (six) months.     docusate sodium (COLACE) 100 MG capsule Take 200 mg by mouth every morning.     Magnesium Glycinate 665 MG CAPS Take 200 mg by mouth daily.     Menaquinone-7 (K2 PO) Take by mouth.     metoprolol succinate (TOPROL-XL) 25 MG 24 hr tablet TAKE 1/2 TABLET (12.5 MG TOTAL) BY MOUTH AT BEDTIME *SCHEDULE APPOINTMENT FOR FURTHER REFILLS* 45 tablet 3   Wheat Dextrin (BENEFIBER DRINK MIX PO) Take 1 Scoop by mouth daily.     No current facility-administered medications for this visit.     ALLERGIES: Sulfamethoxazole, Nsaids, Sulfa antibiotics, and Yellow jacket venom  Family History  Problem Relation Age of Onset   Hyperlipidemia Mother    Heart disease Mother        CABG age 46   Ovarian cancer Mother    Uterine  cancer Mother    Arthritis Mother    Osteoporosis Mother    Heart disease Father        CABG age 36   Arthritis Father    Melanoma Father     Social History   Socioeconomic History   Marital status: Divorced    Spouse name: Not on file   Number of children: 2   Years of education: Not  on file   Highest education level: Some college, no degree  Occupational History   Occupation: retired  Tobacco Use   Smoking status: Never   Smokeless tobacco: Never  Vaping Use   Vaping status: Never Used  Substance and Sexual Activity   Alcohol use: Not Currently   Drug use: No   Sexual activity: Not Currently    Birth control/protection: Post-menopausal    Comment: 1st intercourse- 18, partners- 1   Other Topics Concern   Not on file  Social History Narrative   HH of  1   To rent out room    Retired Artist   Never smoked    Pet cat allergic    Divorced   Regular exericise  walking mowing the lawnswimming   Silver sneakers    Social Drivers of Corporate investment banker Strain: Low Risk  (03/11/2023)   Overall Financial Resource Strain (CARDIA)    Difficulty of Paying Living Expenses: Not hard at all  Food Insecurity: No Food Insecurity (03/11/2023)   Hunger Vital Sign    Worried About Running Out of Food in the Last Year: Never true    Ran Out of Food in the Last Year: Never true  Transportation Needs: No Transportation Needs (03/11/2023)   PRAPARE - Administrator, Civil Service (Medical): No    Lack of Transportation (Non-Medical): No  Physical Activity: Insufficiently Active (03/11/2023)   Exercise Vital Sign    Days of Exercise per Week: 3 days    Minutes of Exercise per Session: 20 min  Stress: No Stress Concern Present (03/11/2023)   [employer] of Occupational Health - Occupational Stress Questionnaire    Feeling of Stress : Only a little  Social Connections: Unknown (03/11/2023)   Social Connection and Isolation Panel [NHANES]     Frequency of Communication with Friends and Family: More than three times a week    Frequency of Social Gatherings with Friends and Family: More than three times a week    Attends Religious Services: Patient declined    Database administrator or Organizations: No    Attends Engineer, structural: Not on file    Marital Status: Divorced  Intimate Partner Violence: Not At Risk (01/18/2023)   Humiliation, Afraid, Rape, and Kick questionnaire    Fear of Current or Ex-Partner: No    Emotionally Abused: No    Physically Abused: No    Sexually Abused: No    Review of Systems  All other systems reviewed and are negative.   PHYSICAL EXAMINATION:   BP 116/64 (BP Location: Left Arm, Patient Position: Sitting, Cuff Size: Small)   Pulse 62   Ht 5\' 6"  (1.676 m)   Wt 159 lb (72.1 kg)   SpO2 98%   BMI 25.66 kg/m     General appearance: alert, cooperative and appears stated age  Pelvic: External genitalia:  no lesions              Urethra:  normal appearing urethra with no masses, tenderness or lesions              Bartholins and Skenes: normal                 Vagina: normal appearing vagina with normal color and discharge,  left sided vaginal ulcer present.               Cervix: no lesions  Bimanual Exam:  Uterus:  normal size, contour, position, consistency, mobility, non-tender              Adnexa: no mass, fullness, tenderness  Chaperone was present for exam:  Warren Lacy, CMA  ASSESSMENT:  Pessary maintenance. Pelvic organ prolapse. Left vaginal ulcer.  PLAN:  Place vaginal estradiol cream 1/2 gram three times a week. FU in 4 weeks for a recheck.   13 min  total time was spent for this patient encounter, including preparation, face-to-face counseling with the patient, coordination of care, and documentation of the encounter.

## 2023-04-13 ENCOUNTER — Encounter: Payer: Self-pay | Admitting: Hematology and Oncology

## 2023-04-13 ENCOUNTER — Inpatient Hospital Stay (HOSPITAL_BASED_OUTPATIENT_CLINIC_OR_DEPARTMENT_OTHER): Payer: Medicare Other | Admitting: Hematology and Oncology

## 2023-04-13 VITALS — BP 133/54 | HR 63 | Temp 97.5°F | Resp 18 | Ht 66.0 in | Wt 157.6 lb

## 2023-04-13 DIAGNOSIS — D649 Anemia, unspecified: Secondary | ICD-10-CM | POA: Diagnosis not present

## 2023-04-13 DIAGNOSIS — D72819 Decreased white blood cell count, unspecified: Secondary | ICD-10-CM

## 2023-04-13 DIAGNOSIS — D509 Iron deficiency anemia, unspecified: Secondary | ICD-10-CM | POA: Diagnosis not present

## 2023-04-13 NOTE — Assessment & Plan Note (Signed)
She has multifactorial anemia She had received adequate intravenous iron infusion with improvement of her blood count Her baseline normal blood count is around 10-10.5 which is stable There is no role for darbepoetin injection I plan to repeat labs again in 2 months for further follow-up

## 2023-04-13 NOTE — Progress Notes (Signed)
[facility] OFFICE PROGRESS NOTE  Panosh, Neta Mends, MD  ASSESSMENT & PLAN:  Symptomatic anemia She has multifactorial anemia She had received adequate intravenous iron infusion with improvement of her blood count Her baseline normal blood count is around 10-10.5 which is stable There is no role for darbepoetin injection I plan to repeat labs again in 2 months for further follow-up  Leukopenia 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   The total time spent in the appointment was 20 minutes encounter with patients including review of chart and various tests results, discussions about plan of care and coordination of care plan   All questions were answered. The patient knows to call the clinic with any problems, questions or concerns. No barriers to learning was detected.    [name], MD 12/19/202412:52 PM  INTERVAL HISTORY: MAGALIE BIEBEL 82 y.o. female returns for further follow-up for mild chronic pancytopenia and severe recurrent iron deficiency anemia She is doing well Her energy level is fair She denies recent bleeding She has occasional wheezing when she lies down and is currently being referred to see a pulmonologist  SUMMARY OF HEMATOLOGIC HISTORY:  AVISHA BARTOSH is seen because of recurrent pancytopenia I have not seen her since 2015 She was transferred to my care after her prior physician has left I reviewed the patient's records extensive and collaborated the history with the patient. Summary of her history is as follows: This is a pleasant woman with multifactorial anemia and associated leukopenia. 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 The chronic anemia and leukopenia go back  as far as we have records (2007) when white counts ran as low as 2700. She has a normal differential. White count average is 3000 and has not changed. Platelet count is normal. Best hemoglobin she achieves with parenteral iron replacement is 11 g. She had a nondiagnostic bone marrow biopsy done in 2006. She had received intravenous iron infusion in the past. Her last colonoscopy in May 2013 showed severe diverticulosis with internal hemorrhoids. She feels well. Denies recent infection. She denies symptoms of fatigue. She tolerated iron supplement well. She had periodic hemorrhoidal bleeding. She was found to have abnormal CBC from intermittently over the years by her primary care doctor She has started taking oral iron supplements since November of last year Most recently, she have noted passage of dark stool She attempted to increase her oral iron supplement but still complain of excessive fatigue Recently, her repeat CBC has dropped from 11-8.2 and hence she was referred back here for further evaluation On 12/15/20, she had repeat EGD and colonoscopy which showed: - Severe diverticulosis in the sigmoid colon, in the descending colon, in the transverse colon and in the ascending colon. - Non-bleeding external and internal hemorrhoids. - No specimens collected.  She denies recent chest pain on exertion, shortness of breath on minimal exertion, pre-syncopal episodes, or palpitations.  The patient denies over the counter NSAID ingestion. She is not on antiplatelets agents. Her last colonoscopy was in 2013 She had no prior history or diagnosis of cancer. Her age appropriate screening programs are up-to-date. She denies any pica and eats a variety of diet.  The patient was prescribed oral iron supplements and she takes  1 dose of oral iron supplement at night to avoid constipation From Sept 2021 till present, she has received many doses of intravenous iron infusion On December 15, 2020, she had  repeat upper endoscopy evaluation due to severe anemia which showed LA Grade B reflux esophagitis with no bleeding. 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 - Gastritis. Biopsied. - Normal examined duodenum.  Since Monoferric IV iron in October 2022, her iron deficiency resolved On May 28, 2021, she started on Aranesp injection  On 06/18/21, she had repeat GI procedure PROCEDURE NOTE: The patient presents with symptomatic grade II-III  hemorrhoids, requesting rubber band ligation of his/her hemorrhoidal disease.  All risks, benefits and alternative forms of therapy were described and informed consent was obtained.   In the Left Lateral Decubitus position anoscopic examination revealed grade II-III hemorrhoids in the left lateral and right posterior position(s).  The anorectum was pre-medicated with 0.125% Nitroglycerine and Recticare The decision was made to band the left lateral internal hemorrhoid, and the [facility] O'Regan System was used to perform band ligation without complication.  Digital anorectal examination was then performed to assure proper positioning of the band, and to adjust the banded tissue as required. The patient was discharged home without pain or other issues.  Dietary and behavioral recommendations were given and along with follow-up instructions.    In March 2023, she received both blood transfusion and intravenous iron infusion On October 15, 2021, she underwent ligation and resection of hemorrhoid On 11/09/2011, she received another unit of blood due to severe rectal bleeding On 11/29/2021, she received 1 dose of darbepoetin injection In February, June 2024 and from September to November 2024, she received intravenous iron sucrose  I have reviewed the past medical history, past surgical history, social history and family history with the patient and they are unchanged from previous note.  ALLERGIES:  is allergic to  sulfamethoxazole, nsaids, sulfa antibiotics, and yellow jacket venom.  MEDICATIONS:  Current Outpatient Medications  Medication Sig Dispense Refill   amLODipine (NORVASC) 5 MG tablet Take 1 tablet (5 mg total) by mouth daily. 90 tablet 3   Calcium Citrate-Vitamin D (CITRACAL + D PO) Take 2 tablets by mouth in the morning and at bedtime. Take 2 tablets po a.m and at lunch. 800 mg calcium     DARBEPOETIN ALFA IJ Inject as directed as needed.     denosumab (PROLIA) 60 MG/ML SOSY injection Inject 60 mg into the skin every 6 (six) months.     docusate sodium (COLACE) 100 MG capsule Take 200 mg by mouth every morning.     Magnesium Glycinate 665 MG CAPS Take 200 mg by mouth daily.     Menaquinone-7 (K2 PO) Take by mouth.     metoprolol succinate (TOPROL-XL) 25 MG 24 hr tablet TAKE 1/2 TABLET (12.5 MG TOTAL) BY MOUTH AT BEDTIME *SCHEDULE APPOINTMENT FOR FURTHER REFILLS* 45 tablet 3   Wheat Dextrin (BENEFIBER DRINK MIX PO) Take 1 Scoop by mouth daily.     No current facility-administered medications for this visit.     REVIEW OF SYSTEMS:   Constitutional: Denies fevers, chills or night sweats Eyes: Denies blurriness of vision Ears, nose, mouth, throat, and face: Denies mucositis or sore throat Respiratory: Denies cough, dyspnea or wheezes Cardiovascular: Denies palpitation, chest discomfort or lower extremity swelling Gastrointestinal:  Denies nausea, heartburn or change in bowel habits Skin: Denies abnormal skin rashes Lymphatics: Denies new lymphadenopathy or easy bruising Neurological:Denies numbness,  tingling or new weaknesses Behavioral/Psych: Mood is stable, no new changes  All other systems were reviewed with the patient and are negative.  PHYSICAL EXAMINATION: ECOG PERFORMANCE STATUS: 1 - Symptomatic but completely ambulatory  Vitals:   04/13/23 1002  BP: (!) 133/54  Pulse: 63  Resp: 18  Temp: (!) 97.5 F (36.4 C)  SpO2: 99%   Filed Weights   04/13/23 1002  Weight: 157 lb  9.6 oz (71.5 kg)    GENERAL:alert, no distress and comfortable   LABORATORY DATA:  I have reviewed the data as listed     Component Value Date/Time   NA 138 03/15/2023 1446   NA 142 05/28/2012 1600   K 4.0 03/15/2023 1446   K 3.7 05/28/2012 1600   CL 101 03/15/2023 1446   CL 103 05/28/2012 1600   CO2 30 03/15/2023 1446   CO2 29 05/28/2012 1600   GLUCOSE 72 03/15/2023 1446   GLUCOSE 99 05/28/2012 1600   BUN 21 03/15/2023 1446   BUN 19.3 05/28/2012 1600   CREATININE 0.89 03/15/2023 1446   CREATININE 0.92 11/18/2021 0932   CREATININE 0.88 12/06/2019 1147   CREATININE 0.8 05/28/2012 1600   CALCIUM 9.7 03/15/2023 1446   CALCIUM 9.5 05/28/2012 1600   PROT 7.6 03/15/2023 1446   PROT 7.3 05/28/2012 1600   ALBUMIN 4.2 03/15/2023 1446   ALBUMIN 3.5 05/28/2012 1600   AST 25 03/15/2023 1446   AST 23 05/28/2012 1600   ALT 13 03/15/2023 1446   ALT 13 05/28/2012 1600   ALKPHOS 79 03/15/2023 1446   ALKPHOS 82 05/28/2012 1600   BILITOT 0.3 03/15/2023 1446   BILITOT <0.20 05/28/2012 1600   GFRNONAA >60 11/18/2021 0932   GFRAA >60 04/23/2018 1634    No results found for: "SPEP", "UPEP"  Lab Results  Component Value Date   WBC 3.6 (L) 04/06/2023   NEUTROABS 1.6 (L) 04/06/2023   HGB 10.8 (L) 04/06/2023   HCT 35.2 (L) 04/06/2023   MCV 88.2 04/06/2023   PLT 213 04/06/2023      Chemistry      Component Value Date/Time   NA 138 03/15/2023 1446   NA 142 05/28/2012 1600   K 4.0 03/15/2023 1446   K 3.7 05/28/2012 1600   CL 101 03/15/2023 1446   CL 103 05/28/2012 1600   CO2 30 03/15/2023 1446   CO2 29 05/28/2012 1600   BUN 21 03/15/2023 1446   BUN 19.3 05/28/2012 1600   CREATININE 0.89 03/15/2023 1446   CREATININE 0.92 11/18/2021 0932   CREATININE 0.88 12/06/2019 1147   CREATININE 0.8 05/28/2012 1600      Component Value Date/Time   CALCIUM 9.7 03/15/2023 1446   CALCIUM 9.5 05/28/2012 1600   ALKPHOS 79 03/15/2023 1446   ALKPHOS 82 05/28/2012 1600   AST 25 03/15/2023  1446   AST 23 05/28/2012 1600   ALT 13 03/15/2023 1446   ALT 13 05/28/2012 1600   BILITOT 0.3 03/15/2023 1446   BILITOT <0.20 05/28/2012 1600

## 2023-04-13 NOTE — Assessment & Plan Note (Signed)
She has chronic intermittent leukopenia This has been present since 2007, unknown etiology Recent repeat B12 level in July 2023 was adequate She is not symptomatic Observe for now

## 2023-04-20 ENCOUNTER — Ambulatory Visit: Payer: Medicare Other

## 2023-04-20 DIAGNOSIS — M810 Age-related osteoporosis without current pathological fracture: Secondary | ICD-10-CM

## 2023-04-20 MED ORDER — DENOSUMAB 60 MG/ML ~~LOC~~ SOSY
60.0000 mg | PREFILLED_SYRINGE | Freq: Once | SUBCUTANEOUS | Status: AC
  Administered 2023-04-20: 60 mg via SUBCUTANEOUS

## 2023-04-20 NOTE — Progress Notes (Signed)
Per orders of Dr. Fabian Sharp, injection of Prolia 60mg /ml given by Narissa Beaufort L Berdene Askari. Patient tolerated injection well.

## 2023-04-21 ENCOUNTER — Encounter: Payer: Self-pay | Admitting: Obstetrics and Gynecology

## 2023-04-21 NOTE — Telephone Encounter (Signed)
Per [facility]: "Continue vaginal estrogen and monitor bleeding.  Vaginal ulcer was noted at last appointment with Dr. Edward Jolly.  Appointment on 1/6 is fine."

## 2023-04-24 ENCOUNTER — Encounter: Payer: Self-pay | Admitting: Internal Medicine

## 2023-04-25 NOTE — Progress Notes (Deleted)
 GYNECOLOGY  VISIT   HPI: 82 y.o.   Divorced  Caucasian  female   [phone] with No LMP recorded. Patient is postmenopausal.   here for f/u on vaginal ulcer and pessary check. Pt reached out via mychart on 04/21/2023 reporting vaginal bleeding-it was recommended at that time by Dr. Dallie that she continue using her vaginal estrogen and monitor the bleeding since ulcer was noted from last OV with Dr. Nikki.   GYNECOLOGIC HISTORY: No LMP recorded. Patient is postmenopausal. Contraception: PM Menopausal hormone therapy: Vaginal Estrogen Last mammogram: 01/10/2023-neg birads 1 ([facility]) Last pap smear: 06/21/2018-WNL w/ atrophy noted.        OB History     Gravida  3   Para  2   Term  2   Preterm      AB  1   Living  2      SAB  1   IAB      Ectopic      Multiple      Live Births                 Patient Active Problem List   Diagnosis Date Noted   Vaginal bleeding 12/13/2021   Bilateral leg edema 05/17/2021   Deficiency anemia 12/16/2019   Abnormal LFTs new 10/01/2014   Other hemorrhoids 12/19/2013   Pelvic prolapse 12/19/2013   Medicare annual wellness visit, initial 12/18/2013   Vertigo, peripheral 11/21/2012   Elevated blood pressure reading 11/21/2012   Dyspnea 10/11/2012   Chest pain 09/19/2012   Decreased exercise tolerance 08/21/2012   Nodule of finger 08/21/2012   Hx of extrinsic asthma 08/21/2012   Leukopenia 07/24/2012   Anemia, normocytic normochromic 07/24/2012   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   Deficiency anemia 10/04/2011   Ankle pain 08/29/2011   Abnormal gait 08/29/2011   Numbness of foot 08/29/2011   Leg length discrepancy 08/07/2011   Scoliosis 08/07/2011   Hx of fall 08/07/2011   Medicare annual wellness visit, subsequent 08/07/2011   Hematuria 02/23/2011   Rectal prolapse 07/03/2010   Visit for preventive health examination 06/28/2010   MALAISE AND FATIGUE 01/27/2010   NUMBNESS 01/27/2010   HYPERCHOLESTEROLEMIA 05/19/2008    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   Hemorrhoids 05/19/2008   UNSPECIFIED VITAMIN D  DEFICIENCY 04/06/2007   Uterine prolapse 04/06/2007   Symptomatic anemia 01/03/2007   DEPRESSION 01/03/2007   ALLERGIC RHINITIS 01/03/2007   ASTHMA 01/03/2007   OSTEOARTHRITIS 01/03/2007   Osteoporosis 01/03/2007    Past Medical History:  Diagnosis Date   Allergic rhinitis    Anxiety    Bradycardia    Chronic constipation    Chronic gastritis    followed by dr shila (GI);  long hx erosive gastritis with bleed   Depression    Family history of adverse reaction to anesthesia    pt's mother as she gets older organs shut down    GERD (gastroesophageal reflux disease)    Heart palpitations    cardiologist-- dr mona;   palpitations w/ intermittant bradycardia;  event monitor 04-01-2020 multiple SVT episodes   Hemorrhoids    s/p banding 05-07-2021 by dr shila   History of asthma    childhood   History of sepsis 02/05/2015   sepsis secondart to cellulitis , left leg/ ankle due to retained ankle hardware   Hyperlipidemia    Hypertension    followed by pcp and cardiology;   normal nuclear stress test 09-28-2012   Iron  deficiency  anemia secondary to blood loss (chronic)    oncologist--- dr lonn;  long hx anemia secondary gi blood loss;  treated with transfusion's and IV iron    Leukopenia    chronic intermittantly --- followed by dr lonn   OA (osteoarthritis)    Osteoporosis    Presence of pessary    PSVT (paroxysmal supraventricular tachycardia) (HCC)    Rectal bleeding    Uterine prolapse    per pt uses pessary   Wears glasses     Past Surgical History:  Procedure Laterality Date   CATARACT EXTRACTION W/ INTRAOCULAR LENS IMPLANT Bilateral    right 2000;  left 2020   COLONOSCOPY WITH ESOPHAGOGASTRODUODENOSCOPY (EGD)  12/15/2020   by dr shila   HARDWARE REMOVAL Left 02/21/2015   Procedure: IRRIGATION AND DEBRIDEMENT HARDWARE REMOVAL LEFT ANKLE ;   Surgeon: Dempsey Moan, MD;  Location: [facility];  Service: Orthopedics;  Laterality: Left;   MASS EXCISION Left 09/11/2012   Procedure: 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; Rotation Flap;  Surgeon: Arley JONELLE Curia, MD;  Location: [facility];  Service: Orthopedics;  Laterality: Left;   ORIF ANKLE FRACTURE Left 03/21/2009   @[facility]   by dr moan;   fracture and dislocation   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 90 tablet 3   Calcium Citrate-Vitamin D  (CITRACAL + D PO) Take 2 tablets by mouth in the morning and at bedtime. Take 2 tablets po a.m and at lunch. 800 mg calcium     DARBEPOETIN ALFA  IJ Inject as directed as needed.     denosumab  (PROLIA ) 60 MG/ML SOSY injection Inject 60 mg into the skin every 6 (six) months.     docusate sodium  (COLACE) 100 MG capsule Take 200 mg by mouth every morning.     Magnesium Glycinate 665 MG CAPS Take 200 mg by mouth daily.     Menaquinone-7 (K2 PO) Take by mouth.     metoprolol  succinate (TOPROL -XL) 25 MG 24 hr tablet TAKE 1/2 TABLET (12.5 MG TOTAL) BY MOUTH AT BEDTIME *SCHEDULE APPOINTMENT FOR FURTHER REFILLS* 45 tablet 3   Wheat Dextrin (BENEFIBER DRINK MIX PO) Take 1 Scoop by mouth daily.     No current facility-administered medications for this visit.     ALLERGIES: Sulfamethoxazole, Nsaids, Sulfa antibiotics, and Yellow jacket venom  Family History  Problem Relation Age of Onset   Hyperlipidemia Mother    Heart disease Mother        CABG age 21   Ovarian cancer Mother    Uterine cancer Mother    Arthritis Mother     Osteoporosis Mother    Heart disease Father        CABG age 85   Arthritis Father    Melanoma Father     Social History   Socioeconomic History   Marital status: Divorced    Spouse name: Not on file   Number of children: 2   Years of education: Not on file   Highest education level: Some college, no degree  Occupational History   Occupation: retired  Tobacco Use   Smoking status: Never  Smokeless tobacco: Never  Vaping Use   Vaping status: Never Used  Substance and Sexual Activity   Alcohol use: Not Currently   Drug use: No   Sexual activity: Not Currently    Birth control/protection: Post-menopausal    Comment: 1st intercourse- 18, partners- 1   Other Topics Concern   Not on file  Social History Narrative   HH of  1   To rent out room    Retired artist   Never smoked    Pet cat allergic    Divorced   Regular exericise  walking mowing the lawnswimming   Silver sneakers    Social Drivers of Corporate Investment Banker Strain: Low Risk  (03/11/2023)   Overall Financial Resource Strain (CARDIA)    Difficulty of Paying Living Expenses: Not hard at all  Food Insecurity: No Food Insecurity (03/11/2023)   Hunger Vital Sign    Worried About Running Out of Food in the Last Year: Never true    Ran Out of Food in the Last Year: Never true  Transportation Needs: No Transportation Needs (03/11/2023)   PRAPARE - Administrator, Civil Service (Medical): No    Lack of Transportation (Non-Medical): No  Physical Activity: Insufficiently Active (03/11/2023)   Exercise Vital Sign    Days of Exercise per Week: 3 days    Minutes of Exercise per Session: 20 min  Stress: No Stress Concern Present (03/11/2023)   [employer] of Occupational Health - Occupational Stress Questionnaire    Feeling of Stress : Only a little  Social Connections: Unknown (03/11/2023)   Social Connection and Isolation Panel [NHANES]    Frequency of Communication with Friends and  Family: More than three times a week    Frequency of Social Gatherings with Friends and Family: More than three times a week    Attends Religious Services: Patient declined    Database Administrator or Organizations: No    Attends Engineer, Structural: Not on file    Marital Status: Divorced  Intimate Partner Violence: Not At Risk (01/18/2023)   Humiliation, Afraid, Rape, and Kick questionnaire    Fear of Current or Ex-Partner: No    Emotionally Abused: No    Physically Abused: No    Sexually Abused: No    Review of Systems  PHYSICAL EXAMINATION:    There were no vitals taken for this visit.    General appearance: alert, cooperative and appears stated age Head: Normocephalic, without obvious abnormality, atraumatic Neck: no adenopathy, supple, symmetrical, trachea midline and thyroid  normal to inspection and palpation Lungs: clear to auscultation bilaterally Breasts: normal appearance, no masses or tenderness, No nipple retraction or dimpling, No nipple discharge or bleeding, No axillary or supraclavicular adenopathy Heart: regular rate and rhythm Abdomen: soft, non-tender, no masses,  no organomegaly Extremities: extremities normal, atraumatic, no cyanosis or edema Skin: Skin color, texture, turgor normal. No rashes or lesions Lymph nodes: Cervical, supraclavicular, and axillary nodes normal. No abnormal inguinal nodes palpated Neurologic: Grossly normal  Pelvic: External genitalia:  no lesions              Urethra:  normal appearing urethra with no masses, tenderness or lesions              Bartholins and Skenes: normal                 Vagina: normal appearing vagina with normal color and discharge, no lesions  Cervix: no lesions                Bimanual Exam:  Uterus:  normal size, contour, position, consistency, mobility, non-tender              Adnexa: no mass, fullness, tenderness              Rectal exam: {yes no:314532}.  Confirms.               Anus:  normal sphincter tone, no lesions  Chaperone was present for exam:  ***  ASSESSMENT     PLAN     An After Visit Summary was printed and given to the patient.  ______ minutes face to face time of which over 50% was spent in counseling.

## 2023-05-01 ENCOUNTER — Ambulatory Visit: Payer: Medicare Other | Admitting: Obstetrics and Gynecology

## 2023-05-03 NOTE — Progress Notes (Signed)
GYNECOLOGY  VISIT   HPI: 83 y.o.   Divorced  Caucasian  female   [phone] with No LMP recorded. Patient is postmenopausal.   here for f/u on vaginal ulcer and pessary check. Pt reached out via mychart on 04/21/2023 reporting vaginal bleeding-it was recommended at that time by Dr. Kennith Center that she continue using her vaginal estrogen and monitor the bleeding since ulcer was noted from last OV with Dr. Edward Jolly.   Pessary has been out for 1 month.   Currently uses  #3 ring with support pessary.  States it wants to slip out when she is on the commode. She used to use a #4 ring with support.  She has had vaginal ulceration, which prompted using a smaller pessary.  Has Rx for vaginal estradiol cream.  Last use was about 2 weeks ago.   No bleeding or discharge.   No pain.   Hard to empty her bladder without the pessary in.  BMS are ok.  Not sexually active.   GYNECOLOGIC HISTORY: No LMP recorded. Patient is postmenopausal. Contraception: PM Menopausal hormone therapy: Vaginal Estrogen Last mammogram: 01/10/2023-neg birads 1 ([facility]) Last pap smear: 06/21/2018-WNL w/ atrophy noted.        OB History     Gravida  3   Para  2   Term  2   Preterm      AB  1   Living  2      SAB  1   IAB      Ectopic      Multiple      Live Births                 Patient Active Problem List   Diagnosis Date Noted   Vaginal bleeding 12/13/2021   Bilateral leg edema 05/17/2021   Deficiency anemia 12/16/2019   Abnormal LFTs new 10/01/2014   Other hemorrhoids 12/19/2013   Pelvic prolapse 12/19/2013   Medicare annual wellness visit, initial 12/18/2013   Vertigo, peripheral 11/21/2012   Elevated blood pressure reading 11/21/2012   Dyspnea 10/11/2012   Chest pain 09/19/2012   Decreased exercise tolerance 08/21/2012   Nodule of finger 08/21/2012   Hx of extrinsic asthma 08/21/2012   Leukopenia 07/24/2012   Anemia, normocytic normochromic 07/24/2012   Yellow jacket sting allergy  12/13/2011   Deficiency anemia 10/04/2011   Ankle pain 08/29/2011   Abnormal gait 08/29/2011   Numbness of foot 08/29/2011   Leg length discrepancy 08/07/2011   Scoliosis 08/07/2011   Hx of fall 08/07/2011   Medicare annual wellness visit, subsequent 08/07/2011   Hematuria 02/23/2011   Rectal prolapse 07/03/2010   Visit for preventive health examination 06/28/2010   MALAISE AND FATIGUE 01/27/2010   NUMBNESS 01/27/2010   HYPERCHOLESTEROLEMIA 05/19/2008   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   Hemorrhoids 05/19/2008   UNSPECIFIED VITAMIN D DEFICIENCY 04/06/2007   Uterine prolapse 04/06/2007   Symptomatic anemia 01/03/2007   DEPRESSION 01/03/2007   ALLERGIC RHINITIS 01/03/2007   ASTHMA 01/03/2007   OSTEOARTHRITIS 01/03/2007   Osteoporosis 01/03/2007    Past Medical History:  Diagnosis Date   Allergic rhinitis    Anxiety    Bradycardia    Chronic constipation    Chronic gastritis    followed by dr Lavon Paganini (GI);  long hx erosive gastritis with bleed   Depression    Family history of adverse reaction to anesthesia    pt's mother as she gets older organs shut down  GERD (gastroesophageal reflux disease)    Heart palpitations    cardiologist-- dr Rennis Golden;   palpitations w/ intermittant bradycardia;  event monitor 04-01-2020 multiple SVT episodes   Hemorrhoids    s/p banding 05-07-2021 by dr Lavon Paganini   History of asthma    childhood   History of sepsis 02/05/2015   sepsis secondart to cellulitis , left leg/ ankle due to retained ankle hardware   Hyperlipidemia    Hypertension    followed by pcp and cardiology;   normal nuclear stress test 09-28-2012   Iron deficiency anemia secondary to blood loss (chronic)    oncologist--- dr Bertis Ruddy;  long hx anemia secondary gi blood loss;  treated with transfusion's and IV iron   Leukopenia    chronic intermittantly --- followed by dr Bertis Ruddy   OA (osteoarthritis)    Osteoporosis    Presence of pessary     PSVT (paroxysmal supraventricular tachycardia) (HCC)    Rectal bleeding    Uterine prolapse    per pt uses pessary   Wears glasses     Past Surgical History:  Procedure Laterality Date   CATARACT EXTRACTION W/ INTRAOCULAR LENS IMPLANT Bilateral    right 2000;  left 2020   COLONOSCOPY WITH ESOPHAGOGASTRODUODENOSCOPY (EGD)  12/15/2020   by dr Lavon Paganini   HARDWARE REMOVAL Left 02/21/2015   Procedure: IRRIGATION AND DEBRIDEMENT HARDWARE REMOVAL LEFT ANKLE ;  Surgeon: Ollen Gross, MD;  Location: [facility];  Service: Orthopedics;  Laterality: Left;   MASS EXCISION Left 09/11/2012   Procedure: 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; Rotation Flap;  Surgeon: Nicki Reaper, MD;  Location: [facility];  Service: Orthopedics;  Laterality: Left;   ORIF ANKLE FRACTURE Left 03/21/2009   @[facility]   by dr Lequita Halt;   fracture and dislocation   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 90 tablet 3   Calcium Citrate-Vitamin D (CITRACAL + D PO) Take 2 tablets by mouth in the morning and at bedtime. Take 2 tablets po a.m and at lunch. 800 mg calcium     DARBEPOETIN ALFA IJ Inject as directed as needed.     denosumab (PROLIA) 60 MG/ML SOSY injection Inject 60 mg into the skin every 6 (six) months.     docusate sodium (COLACE) 100 MG capsule Take 200 mg by mouth every morning.     Magnesium Glycinate 665 MG CAPS Take 200 mg by mouth daily.     Menaquinone-7 (K2 PO) Take by mouth.     metoprolol succinate (TOPROL-XL) 25 MG 24 hr tablet TAKE 1/2 TABLET (12.5 MG TOTAL) BY MOUTH AT BEDTIME *SCHEDULE  APPOINTMENT FOR FURTHER REFILLS* 45 tablet 3   Wheat Dextrin (BENEFIBER DRINK MIX PO) Take 1 Scoop by mouth daily.     No current facility-administered medications for this visit.     ALLERGIES: Sulfamethoxazole, Nsaids, Sulfa antibiotics, and Yellow jacket venom  Family History  Problem Relation Age of Onset   Hyperlipidemia Mother    Heart disease Mother        CABG age 45   Ovarian cancer Mother    Uterine cancer Mother  Arthritis Mother    Osteoporosis Mother    Heart disease Father        CABG age 34   Arthritis Father    Melanoma Father     Social History   Socioeconomic History   Marital status: Divorced    Spouse name: Not on file   Number of children: 2   Years of education: Not on file   Highest education level: Some college, no degree  Occupational History   Occupation: retired  Tobacco Use   Smoking status: Never   Smokeless tobacco: Never  Vaping Use   Vaping status: Never Used  Substance and Sexual Activity   Alcohol use: Not Currently   Drug use: No   Sexual activity: Not Currently    Birth control/protection: Post-menopausal    Comment: 1st intercourse- 18, partners- 1   Other Topics Concern   Not on file  Social History Narrative   HH of  1   To rent out room    Retired Artist   Never smoked    Pet cat allergic    Divorced   Regular exericise  walking mowing the lawnswimming   Silver sneakers    Social Drivers of Corporate investment banker Strain: Low Risk  (03/11/2023)   Overall Financial Resource Strain (CARDIA)    Difficulty of Paying Living Expenses: Not hard at all  Food Insecurity: No Food Insecurity (03/11/2023)   Hunger Vital Sign    Worried About Running Out of Food in the Last Year: Never true    Ran Out of Food in the Last Year: Never true  Transportation Needs: No Transportation Needs (03/11/2023)   PRAPARE - Administrator, Civil Service (Medical): No    Lack of Transportation (Non-Medical): No   Physical Activity: Insufficiently Active (03/11/2023)   Exercise Vital Sign    Days of Exercise per Week: 3 days    Minutes of Exercise per Session: 20 min  Stress: No Stress Concern Present (03/11/2023)   [employer] of Occupational Health - Occupational Stress Questionnaire    Feeling of Stress : Only a little  Social Connections: Unknown (03/11/2023)   Social Connection and Isolation Panel [NHANES]    Frequency of Communication with Friends and Family: More than three times a week    Frequency of Social Gatherings with Friends and Family: More than three times a week    Attends Religious Services: Patient declined    Database administrator or Organizations: No    Attends Engineer, structural: Not on file    Marital Status: Divorced  Intimate Partner Violence: Not At Risk (01/18/2023)   Humiliation, Afraid, Rape, and Kick questionnaire    Fear of Current or Ex-Partner: No    Emotionally Abused: No    Physically Abused: No    Sexually Abused: No    Review of Systems  All other systems reviewed and are negative.   PHYSICAL EXAMINATION:    BP 136/82 (BP Location: Left Arm, Patient Position: Sitting, Cuff Size: Small)   Pulse 82   Ht 5\' 6"  (1.676 m)   Wt 159 lb (72.1 kg)   SpO2 99%   BMI 25.66 kg/m     General appearance: alert, cooperative and appears stated age   Pelvic: External genitalia:  no lesions              Urethra:  normal appearing urethra with no masses, tenderness or lesions  Bartholins and Skenes: normal                 Vagina: third degree cystocele and first degree uterine prolapse.  Bilateral vaginal apical ulceration.  Posterior vaginal mucosa with granulation tissue versus vaginal lesion.  Some silver nitrate applied ot bleeding area.  Pap and HR HPV collected.  Verbal consent for vaginal biopsy taken with Tischler after prep with Hibiclens. Minimal bleeding.  Tissue to pathology.             Cervix: no lesions                 Bimanual Exam:  Uterus:  normal size, contour, position, consistency, mobility, non-tender              Adnexa: no mass, fullness, tenderness           Chaperone was present for exam:  Warren Lacy, CMA  ASSESSMENT  Uterovaginal prolapse.  Vaginal lesion.  Persistent vaginal ulceration and suspected granulation tissue.  Pessary maintenance.   PLAN  Estradiol cream three times a week.  Pap and HR HPV testing.  Fu vaginal biopsy.  We discussed surgical treatment options for vaginal reconstruction versus colpocleisis.  Anticipate referral to Urogynecology.   30 min  total time was spent for this patient encounter, including preparation, face-to-face counseling with the patient, coordination of care, and documentation of the encounter.

## 2023-05-17 ENCOUNTER — Ambulatory Visit (INDEPENDENT_AMBULATORY_CARE_PROVIDER_SITE_OTHER): Payer: Medicare Other | Admitting: Obstetrics and Gynecology

## 2023-05-17 ENCOUNTER — Other Ambulatory Visit (HOSPITAL_COMMUNITY): Admission: RE | Admit: 2023-05-17 | Payer: Medicare Other | Source: Ambulatory Visit

## 2023-05-17 ENCOUNTER — Encounter: Payer: Self-pay | Admitting: Obstetrics and Gynecology

## 2023-05-17 ENCOUNTER — Other Ambulatory Visit (HOSPITAL_COMMUNITY)
Admission: RE | Admit: 2023-05-17 | Discharge: 2023-05-17 | Disposition: A | Discharge status: Home / Self Care | Payer: Medicare Other | Source: Ambulatory Visit | Attending: Obstetrics and Gynecology | Admitting: Obstetrics and Gynecology

## 2023-05-17 VITALS — BP 136/82 | HR 82 | Ht 66.0 in | Wt 159.0 lb

## 2023-05-17 DIAGNOSIS — Z1272 Encounter for screening for malignant neoplasm of vagina: Secondary | ICD-10-CM

## 2023-05-17 DIAGNOSIS — N898 Other specified noninflammatory disorders of vagina: Secondary | ICD-10-CM | POA: Insufficient documentation

## 2023-05-17 DIAGNOSIS — N814 Uterovaginal prolapse, unspecified: Secondary | ICD-10-CM | POA: Diagnosis not present

## 2023-05-17 MED ORDER — ESTRADIOL 0.1 MG/GM VA CREA: TOPICAL_CREAM | VAGINAL | 1 refills | Status: DC

## 2023-05-18 ENCOUNTER — Inpatient Hospital Stay: Payer: Medicare Other | Attending: Hematology and Oncology

## 2023-05-18 DIAGNOSIS — D500 Iron deficiency anemia secondary to blood loss (chronic): Secondary | ICD-10-CM

## 2023-05-18 DIAGNOSIS — D509 Iron deficiency anemia, unspecified: Secondary | ICD-10-CM | POA: Diagnosis present

## 2023-05-18 LAB — IRON AND IRON BINDING CAPACITY (CC-WL,HP ONLY)
Iron: 43 ug/dL (ref 28–170)
Saturation Ratios: 12 % (ref 10.4–31.8)
TIBC: 351 ug/dL (ref 250–450)
UIBC: 308 ug/dL (ref 148–442)

## 2023-05-18 LAB — CBC WITH DIFFERENTIAL/PLATELET
Abs Immature Granulocytes: 0.01 10*3/uL (ref 0.00–0.07)
Basophils Absolute: 0.1 10*3/uL (ref 0.0–0.1)
Basophils Relative: 1 %
Eosinophils Absolute: 0.3 10*3/uL (ref 0.0–0.5)
Eosinophils Relative: 7 %
HCT: 35 % — ABNORMAL LOW (ref 36.0–46.0)
Hemoglobin: 10.4 g/dL — ABNORMAL LOW (ref 12.0–15.0)
Immature Granulocytes: 0 %
Lymphocytes Relative: 32 %
Lymphs Abs: 1.2 10*3/uL (ref 0.7–4.0)
MCH: 26.7 pg (ref 26.0–34.0)
MCHC: 29.7 g/dL — ABNORMAL LOW (ref 30.0–36.0)
MCV: 89.7 fL (ref 80.0–100.0)
Monocytes Absolute: 0.4 10*3/uL (ref 0.1–1.0)
Monocytes Relative: 10 %
Neutro Abs: 1.8 10*3/uL (ref 1.7–7.7)
Neutrophils Relative %: 50 %
Platelets: 201 10*3/uL (ref 150–400)
RBC: 3.9 MIL/uL (ref 3.87–5.11)
RDW: 17.6 % — ABNORMAL HIGH (ref 11.5–15.5)
WBC: 3.7 10*3/uL — ABNORMAL LOW (ref 4.0–10.5)
nRBC: 0 % (ref 0.0–0.2)

## 2023-05-18 LAB — FERRITIN: Ferritin: 60 ng/mL (ref 11–307)

## 2023-05-19 LAB — SURGICAL PATHOLOGY

## 2023-05-21 ENCOUNTER — Encounter: Payer: Self-pay | Admitting: Obstetrics and Gynecology

## 2023-05-23 LAB — CYTOLOGY - PAP
Adequacy: ABNORMAL
Comment: NEGATIVE

## 2023-05-25 ENCOUNTER — Inpatient Hospital Stay (HOSPITAL_BASED_OUTPATIENT_CLINIC_OR_DEPARTMENT_OTHER): Payer: Medicare Other | Admitting: Hematology and Oncology

## 2023-05-25 ENCOUNTER — Encounter: Payer: Self-pay | Admitting: Hematology and Oncology

## 2023-05-25 VITALS — BP 133/55 | HR 66 | Temp 97.5°F | Resp 18 | Ht 66.0 in | Wt 160.0 lb

## 2023-05-25 DIAGNOSIS — D539 Nutritional anemia, unspecified: Secondary | ICD-10-CM

## 2023-05-25 DIAGNOSIS — D509 Iron deficiency anemia, unspecified: Secondary | ICD-10-CM | POA: Diagnosis not present

## 2023-05-25 NOTE — Assessment & Plan Note (Signed)
The patient has multifactorial anemia, combination of iron deficiency due to chronic GI bleed as well as bone marrow disorder Her anemia is stable since last intravenous iron infusion She does not need darbepoetin injection I plan to bring her back again in a month for further follow-up She will get her labs done a week ahead of time

## 2023-05-25 NOTE — Progress Notes (Signed)
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 The patient knows to call the clinic with any problems, questions or concerns. No barriers to learning was detected.    [name], MD 1/30/202510:52 AM  INTERVAL HISTORY: Belinda Harper 83 y.o. female returns for further follow-up for chronic anemia and mild pancytopenia She is doing well She continues to have intermittent rectal bleeding once in a while Denies signs and symptoms of anemia No recent infection We discussed test results and future follow-up  SUMMARY OF HEMATOLOGIC HISTORY:  [name] is seen because of recurrent pancytopenia I have not seen her since 2015 She was transferred to my care after her prior physician has left I reviewed the patient's records extensive and collaborated the history with the patient. Summary of her history is as follows: This is a pleasant woman with multifactorial anemia and associated leukopenia. 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 The chronic anemia and leukopenia go back as far as we have records (2007) when white counts ran as low as 2700. She  has a normal differential. White count average is 3000 and has not changed. Platelet count is normal. Best hemoglobin she achieves with parenteral iron replacement is 11 g. She had a nondiagnostic bone marrow biopsy done in 2006. She had received intravenous iron infusion in the past. Her last colonoscopy in May 2013 showed severe diverticulosis with internal hemorrhoids. She feels well. Denies recent infection. She denies symptoms of fatigue. She tolerated iron supplement well. She had periodic hemorrhoidal bleeding. She was found to have abnormal CBC from intermittently over the years by her primary care doctor She has started taking oral iron supplements since November of last year Most recently, she have noted passage of dark stool She attempted to increase her oral iron supplement but still complain of excessive fatigue Recently, her repeat CBC has dropped from 11-8.2 and hence she was referred back here for further evaluation On 12/15/20, she had repeat EGD and colonoscopy which showed: - Severe diverticulosis in the sigmoid colon, in the descending colon, in the transverse colon and in the ascending colon. - Non-bleeding external and internal hemorrhoids. - No specimens collected.  She denies recent chest pain on exertion, shortness of breath on minimal exertion, pre-syncopal episodes, or palpitations.  The patient denies over the counter NSAID ingestion. She is not on antiplatelets agents. Her last colonoscopy was in 2013 She had no prior history or diagnosis of cancer. Her age appropriate screening programs are up-to-date. She denies any pica and eats a variety of diet.  The patient was prescribed oral iron supplements  and she takes 1 dose of oral iron supplement at night to avoid constipation From Sept 2021 till present, she has received many doses of intravenous iron infusion On December 15, 2020, she had repeat upper endoscopy evaluation due to severe anemia which showed LA Grade  B reflux esophagitis with no bleeding. 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 - Gastritis. Biopsied. - Normal examined duodenum.  Since Monoferric IV iron in October 2022, her iron deficiency resolved On May 28, 2021, she started on Aranesp injection  On 06/18/21, she had repeat GI procedure PROCEDURE NOTE: The patient presents with symptomatic grade II-III  hemorrhoids, requesting rubber band ligation of his/her hemorrhoidal disease.  All risks, benefits and alternative forms of therapy were described and informed consent was obtained.   In the Left Lateral Decubitus position anoscopic examination revealed grade II-III hemorrhoids in the left lateral and right posterior position(s).  The anorectum was pre-medicated with 0.125% Nitroglycerine and Recticare The decision was made to band the left lateral internal hemorrhoid, and the [facility] O'Regan System was used to perform band ligation without complication.  Digital anorectal examination was then performed to assure proper positioning of the band, and to adjust the banded tissue as required. The patient was discharged home without pain or other issues.  Dietary and behavioral recommendations were given and along with follow-up instructions.    In March 2023, she received both blood transfusion and intravenous iron infusion On October 15, 2021, she underwent ligation and resection of hemorrhoid On 11/09/2011, she received another unit of blood due to severe rectal bleeding On 11/29/2021, she received 1 dose of darbepoetin injection In February, June 2024 and from September to November 2024, she received intravenous iron sucrose  I have reviewed the past medical history, past surgical history, social history and family history with the patient and they are unchanged from previous note.  ALLERGIES:  is allergic to sulfamethoxazole, nsaids, sulfa antibiotics, and yellow jacket  venom.  MEDICATIONS:  Current Outpatient Medications  Medication Sig Dispense Refill   amLODipine (NORVASC) 5 MG tablet Take 1 tablet (5 mg total) by mouth daily. 90 tablet 3   Calcium Citrate-Vitamin D (CITRACAL + D PO) Take 2 tablets by mouth in the morning and at bedtime. Take 2 tablets po a.m and at lunch. 800 mg calcium     DARBEPOETIN ALFA IJ Inject as directed as needed.     denosumab (PROLIA) 60 MG/ML SOSY injection Inject 60 mg into the skin every 6 (six) months.     docusate sodium (COLACE) 100 MG capsule Take 200 mg by mouth every morning.     estradiol (ESTRACE) 0.1 MG/GM vaginal cream Use 1/2 g vaginally three times per week as needed. 42.5 g 1   Magnesium Glycinate 665 MG CAPS Take 200 mg by mouth daily.     Menaquinone-7 (K2 PO) Take by mouth.     metoprolol succinate (TOPROL-XL) 25 MG 24 hr tablet TAKE 1/2 TABLET (12.5 MG TOTAL) BY MOUTH AT BEDTIME *SCHEDULE APPOINTMENT FOR FURTHER REFILLS* 45 tablet 3   Wheat Dextrin (BENEFIBER DRINK MIX PO) Take 1 Scoop by mouth daily.     No current facility-administered medications for this visit.     REVIEW OF SYSTEMS:   Constitutional: Denies fevers, chills or night sweats Eyes: Denies blurriness of vision Ears, nose, mouth, throat, and face: Denies mucositis or sore throat Respiratory: Denies cough, dyspnea or wheezes Cardiovascular: Denies palpitation, chest discomfort or lower extremity swelling  Gastrointestinal:  Denies nausea, heartburn or change in bowel habits Skin: Denies abnormal skin rashes Lymphatics: Denies new lymphadenopathy or easy bruising Neurological:Denies numbness, tingling or new weaknesses Behavioral/Psych: Mood is stable, no new changes  All other systems were reviewed with the patient and are negative.  PHYSICAL EXAMINATION: ECOG PERFORMANCE STATUS: 0 - Asymptomatic  Vitals:   05/25/23 0943  BP: (!) 133/55  Pulse: 66  Resp: 18  Temp: (!) 97.5 F (36.4 C)  SpO2: 97%   Filed Weights   05/25/23  0943  Weight: 160 lb (72.6 kg)    GENERAL:alert, no distress and comfortable LABORATORY DATA:  I have reviewed the data as listed     Component Value Date/Time   NA 138 03/15/2023 1446   NA 142 05/28/2012 1600   K 4.0 03/15/2023 1446   K 3.7 05/28/2012 1600   CL 101 03/15/2023 1446   CL 103 05/28/2012 1600   CO2 30 03/15/2023 1446   CO2 29 05/28/2012 1600   GLUCOSE 72 03/15/2023 1446   GLUCOSE 99 05/28/2012 1600   BUN 21 03/15/2023 1446   BUN 19.3 05/28/2012 1600   CREATININE 0.89 03/15/2023 1446   CREATININE 0.92 11/18/2021 0932   CREATININE 0.88 12/06/2019 1147   CREATININE 0.8 05/28/2012 1600   CALCIUM 9.7 03/15/2023 1446   CALCIUM 9.5 05/28/2012 1600   PROT 7.6 03/15/2023 1446   PROT 7.3 05/28/2012 1600   ALBUMIN 4.2 03/15/2023 1446   ALBUMIN 3.5 05/28/2012 1600   AST 25 03/15/2023 1446   AST 23 05/28/2012 1600   ALT 13 03/15/2023 1446   ALT 13 05/28/2012 1600   ALKPHOS 79 03/15/2023 1446   ALKPHOS 82 05/28/2012 1600   BILITOT 0.3 03/15/2023 1446   BILITOT <0.20 05/28/2012 1600   GFRNONAA >60 11/18/2021 0932   GFRAA >60 04/23/2018 1634    No results found for: "SPEP", "UPEP"  Lab Results  Component Value Date   WBC 3.7 (L) 05/18/2023   NEUTROABS 1.8 05/18/2023   HGB 10.4 (L) 05/18/2023   HCT 35.0 (L) 05/18/2023   MCV 89.7 05/18/2023   PLT 201 05/18/2023      Chemistry      Component Value Date/Time   NA 138 03/15/2023 1446   NA 142 05/28/2012 1600   K 4.0 03/15/2023 1446   K 3.7 05/28/2012 1600   CL 101 03/15/2023 1446   CL 103 05/28/2012 1600   CO2 30 03/15/2023 1446   CO2 29 05/28/2012 1600   BUN 21 03/15/2023 1446   BUN 19.3 05/28/2012 1600   CREATININE 0.89 03/15/2023 1446   CREATININE 0.92 11/18/2021 0932   CREATININE 0.88 12/06/2019 1147   CREATININE 0.8 05/28/2012 1600      Component Value Date/Time   CALCIUM 9.7 03/15/2023 1446   CALCIUM 9.5 05/28/2012 1600   ALKPHOS 79 03/15/2023 1446   ALKPHOS 82 05/28/2012 1600   AST 25  03/15/2023 1446   AST 23 05/28/2012 1600   ALT 13 03/15/2023 1446   ALT 13 05/28/2012 1600   BILITOT 0.3 03/15/2023 1446   BILITOT <0.20 05/28/2012 1600

## 2023-06-01 ENCOUNTER — Encounter: Payer: Self-pay | Admitting: Internal Medicine

## 2023-06-12 ENCOUNTER — Ambulatory Visit (INDEPENDENT_AMBULATORY_CARE_PROVIDER_SITE_OTHER): Payer: Medicare Other | Admitting: Obstetrics and Gynecology

## 2023-06-12 ENCOUNTER — Telehealth: Payer: Self-pay | Admitting: Obstetrics and Gynecology

## 2023-06-12 ENCOUNTER — Other Ambulatory Visit (HOSPITAL_COMMUNITY)
Admission: RE | Admit: 2023-06-12 | Discharge: 2023-06-12 | Disposition: A | Discharge status: Home / Self Care | Payer: Medicare Other | Source: Ambulatory Visit | Attending: Obstetrics and Gynecology | Admitting: Obstetrics and Gynecology

## 2023-06-12 ENCOUNTER — Encounter: Payer: Self-pay | Admitting: Obstetrics and Gynecology

## 2023-06-12 VITALS — BP 124/76 | HR 67 | Ht 66.0 in | Wt 160.0 lb

## 2023-06-12 DIAGNOSIS — Z1151 Encounter for screening for human papillomavirus (HPV): Secondary | ICD-10-CM | POA: Diagnosis not present

## 2023-06-12 DIAGNOSIS — Z124 Encounter for screening for malignant neoplasm of cervix: Secondary | ICD-10-CM

## 2023-06-12 DIAGNOSIS — Z01419 Encounter for gynecological examination (general) (routine) without abnormal findings: Secondary | ICD-10-CM | POA: Insufficient documentation

## 2023-06-12 DIAGNOSIS — N813 Complete uterovaginal prolapse: Secondary | ICD-10-CM

## 2023-06-12 NOTE — Telephone Encounter (Signed)
Please assist in referral for [facility] for complete uterovaginal prolapse.

## 2023-06-12 NOTE — Progress Notes (Signed)
GYNECOLOGY  VISIT   HPI: 83 y.o.   Divorced  Caucasian female   [phone] with No LMP recorded. Patient is postmenopausal.   here for: repeat pap.  Her pap was nondiagnostic on 05/17/23.   Patient has hx of progressive vaginal inflammation/ulceration with ring with support pessary use with different sizes. At her visit on 05/17/23, she had a pap and a vaginal biopsy done due to increasing inflammatory change of the vagina with pessary use.  Results showed benign granulation tissue.     Her pap was non-diagnostic.   States she is using the vaginal estrogen cream  three times a week.  Her pessary has been left out.   She pushes her bladder back up to void well.  Hx chronic anemia.   Doing iron infusions.   She would like to consider a potential surgery that involves less potential bleeding.   GYNECOLOGIC HISTORY: No LMP recorded. Patient is postmenopausal. Contraception:  PMP Menopausal hormone therapy:  estrace Last 2 paps:  06/21/2018-WNL w/ atrophy noted.  History of abnormal Pap or positive HPV:  no Mammogram:  01/10/2023-neg birads 1 ([facility])         OB History     Gravida  3   Para  2   Term  2   Preterm      AB  1   Living  2      SAB  1   IAB      Ectopic      Multiple      Live Births                 Patient Active Problem List   Diagnosis Date Noted   Vaginal bleeding 12/13/2021   Bilateral leg edema 05/17/2021   Deficiency anemia 12/16/2019   Abnormal LFTs new 10/01/2014   Other hemorrhoids 12/19/2013   Pelvic prolapse 12/19/2013   Medicare annual wellness visit, initial 12/18/2013   Vertigo, peripheral 11/21/2012   Elevated blood pressure reading 11/21/2012   Dyspnea 10/11/2012   Chest pain 09/19/2012   Decreased exercise tolerance 08/21/2012   Nodule of finger 08/21/2012   Hx of extrinsic asthma 08/21/2012   Leukopenia 07/24/2012   Anemia, normocytic normochromic 07/24/2012   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   Deficiency  anemia 10/04/2011   Ankle pain 08/29/2011   Abnormal gait 08/29/2011   Numbness of foot 08/29/2011   Leg length discrepancy 08/07/2011   Scoliosis 08/07/2011   Hx of fall 08/07/2011   Medicare annual wellness visit, subsequent 08/07/2011   Hematuria 02/23/2011   Rectal prolapse 07/03/2010   Visit for preventive health examination 06/28/2010   MALAISE AND FATIGUE 01/27/2010   NUMBNESS 01/27/2010   HYPERCHOLESTEROLEMIA 05/19/2008   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   Hemorrhoids 05/19/2008   UNSPECIFIED VITAMIN D DEFICIENCY 04/06/2007   Uterine prolapse 04/06/2007   Symptomatic anemia 01/03/2007   DEPRESSION 01/03/2007   ALLERGIC RHINITIS 01/03/2007   ASTHMA 01/03/2007   OSTEOARTHRITIS 01/03/2007   Osteoporosis 01/03/2007    Past Medical History:  Diagnosis Date   Allergic rhinitis    Anxiety    Bradycardia    Chronic constipation    Chronic gastritis    followed by dr Lavon Paganini (GI);  long hx erosive gastritis with bleed   Depression    Family history of adverse reaction to anesthesia    pt's mother as she gets older organs shut down    GERD (gastroesophageal reflux disease)  Heart palpitations    cardiologist-- dr Rennis Golden;   palpitations w/ intermittant bradycardia;  event monitor 04-01-2020 multiple SVT episodes   Hemorrhoids    s/p banding 05-07-2021 by dr Lavon Paganini   History of asthma    childhood   History of sepsis 02/05/2015   sepsis secondart to cellulitis , left leg/ ankle due to retained ankle hardware   Hyperlipidemia    Hypertension    followed by pcp and cardiology;   normal nuclear stress test 09-28-2012   Iron deficiency anemia secondary to blood loss (chronic)    oncologist--- dr Bertis Ruddy;  long hx anemia secondary gi blood loss;  treated with transfusion's and IV iron   Leukopenia    chronic intermittantly --- followed by dr Bertis Ruddy   OA (osteoarthritis)    Osteoporosis    Presence of pessary    PSVT (paroxysmal  supraventricular tachycardia) (HCC)    Rectal bleeding    Uterine prolapse    per pt uses pessary   Wears glasses     Past Surgical History:  Procedure Laterality Date   CATARACT EXTRACTION W/ INTRAOCULAR LENS IMPLANT Bilateral    right 2000;  left 2020   COLONOSCOPY WITH ESOPHAGOGASTRODUODENOSCOPY (EGD)  12/15/2020   by dr Lavon Paganini   HARDWARE REMOVAL Left 02/21/2015   Procedure: IRRIGATION AND DEBRIDEMENT HARDWARE REMOVAL LEFT ANKLE ;  Surgeon: Ollen Gross, MD;  Location: [facility];  Service: Orthopedics;  Laterality: Left;   MASS EXCISION Left 09/11/2012   Procedure: 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; Rotation Flap;  Surgeon: Nicki Reaper, MD;  Location: [facility];  Service: Orthopedics;  Laterality: Left;   ORIF ANKLE FRACTURE Left 03/21/2009   @[facility]   by dr Lequita Halt;   fracture and dislocation   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 90 tablet 3   Calcium Citrate-Vitamin D (CITRACAL + D PO) Take 2 tablets by mouth in the morning and at bedtime. Take 2 tablets po a.m and at lunch. 800 mg calcium     DARBEPOETIN ALFA IJ Inject as directed as needed.     denosumab (PROLIA) 60 MG/ML SOSY injection Inject 60 mg into the skin every 6 (six) months.     docusate sodium (COLACE) 100 MG capsule Take 200 mg by mouth every morning.     estradiol (ESTRACE) 0.1 MG/GM vaginal cream Use 1/2 g vaginally three times per week as needed. 42.5 g 1   Magnesium Glycinate 665 MG CAPS Take 200 mg by mouth daily.     Menaquinone-7 (K2 PO) Take by mouth.     metoprolol succinate  (TOPROL-XL) 25 MG 24 hr tablet TAKE 1/2 TABLET (12.5 MG TOTAL) BY MOUTH AT BEDTIME *SCHEDULE APPOINTMENT FOR FURTHER REFILLS* 45 tablet 3   Wheat Dextrin (BENEFIBER DRINK MIX PO) Take 1 Scoop by mouth daily.     No current facility-administered medications for this visit.     ALLERGIES: Sulfamethoxazole, Nsaids, Sulfa antibiotics, and Yellow jacket venom  Family History  Problem Relation Age of Onset   Hyperlipidemia Mother    Heart disease Mother        CABG age  70   Ovarian cancer Mother    Uterine cancer Mother    Arthritis Mother    Osteoporosis Mother    Heart disease Father        CABG age 45   Arthritis Father    Melanoma Father     Social History   Socioeconomic History   Marital status: Divorced    Spouse name: Not on file   Number of children: 2   Years of education: Not on file   Highest education level: Some college, no degree  Occupational History   Occupation: retired  Tobacco Use   Smoking status: Never   Smokeless tobacco: Never  Vaping Use   Vaping status: Never Used  Substance and Sexual Activity   Alcohol use: Not Currently   Drug use: No   Sexual activity: Not Currently    Birth control/protection: Post-menopausal    Comment: 1st intercourse- 18, partners- 1   Other Topics Concern   Not on file  Social History Narrative   HH of  1   To rent out room    Retired Artist   Never smoked    Pet cat allergic    Divorced   Regular exericise  walking mowing the lawnswimming   Silver sneakers    Social Drivers of Corporate investment banker Strain: Low Risk  (03/11/2023)   Overall Financial Resource Strain (CARDIA)    Difficulty of Paying Living Expenses: Not hard at all  Food Insecurity: No Food Insecurity (03/11/2023)   Hunger Vital Sign    Worried About Running Out of Food in the Last Year: Never true    Ran Out of Food in the Last Year: Never true  Transportation Needs: No Transportation Needs (03/11/2023)   PRAPARE -  Administrator, Civil Service (Medical): No    Lack of Transportation (Non-Medical): No  Physical Activity: Insufficiently Active (03/11/2023)   Exercise Vital Sign    Days of Exercise per Week: 3 days    Minutes of Exercise per Session: 20 min  Stress: No Stress Concern Present (03/11/2023)   [employer] of Occupational Health - Occupational Stress Questionnaire    Feeling of Stress : Only a little  Social Connections: Unknown (03/11/2023)   Social Connection and Isolation Panel [NHANES]    Frequency of Communication with Friends and Family: More than three times a week    Frequency of Social Gatherings with Friends and Family: More than three times a week    Attends Religious Services: Patient declined    Database administrator or Organizations: No    Attends Engineer, structural: Not on file    Marital Status: Divorced  Intimate Partner Violence: Not At Risk (01/18/2023)   Humiliation, Afraid, Rape, and Kick questionnaire    Fear of Current or Ex-Partner: No    Emotionally Abused: No    Physically Abused: No    Sexually Abused: No    Review of Systems  All other systems reviewed and are negative.   PHYSICAL EXAMINATION:   BP 124/76 (BP Location: Right Arm, Patient Position: Sitting, Cuff Size: Small)   Pulse 67   Ht 5\' 6"  (1.676 m)   Wt 160 lb (72.6 kg)   SpO2 97%   BMI 25.82 kg/m     General appearance: alert, cooperative and appears stated age  Pelvic: External genitalia:  no lesions              Urethra:  normal appearing  urethra with no masses, tenderness or lesions              Bartholins and Skenes: normal                 Vagina: small amount of vaginal granulation tissue of the right posterior vaginal apex, much less in quantity.   Treated with AgNO3 with verbal permission.               Cervix: no lesions.  Third degree uterine prolapse and cystocele with valsalva.                 Bimanual Exam:  Uterus:  normal size, contour,  position, consistency, mobility, non-tender              Adnexa: no mass, fullness, tenderness           Chaperone was present for exam:  Warren Lacy, CMA  ASSESSMENT:  Complete uterovaginal prolapse. Vaginal inflammation improving with absence of pessary use.   Vaginal granulation tissue noted.   Treated with AgNO3. Nondiagnostic pap.  Chronic anemia.   PLAN:  Pap and HR HPV collected.  We discussed colpocleisis versus hysterectomy with reconstruction.  Continue vaginal estrogen therapy. We discussed that there are different options for pessary shapes and sizes, but that they all have potential for an inflammatory change in the vagina. Will referral to [facility] health Urogynecology.  FU prn.   35 min  total time was spent for this patient encounter, including preparation, face-to-face counseling with the patient, coordination of care, and documentation of the encounter.

## 2023-06-12 NOTE — Patient Instructions (Addendum)
Pelvic Organ Prolapse Pelvic organ prolapse is a condition in women that involves the stretching, bulging, or dropping of pelvic organs into an abnormal position, past the opening of the vagina. It happens when the muscles and tissues that surround and support pelvic structures become weak or stretched. Pelvic organ prolapse can involve the: Vagina (vaginal prolapse). Uterus (uterine prolapse). Bladder (cystocele). Rectum (rectocele). Intestines (enterocele). When organs other than the vagina are involved, they often bulge into the vagina or protrude from the vagina, depending on how severe the prolapse is. What are the causes? This condition may be caused by: Pregnancy, labor, and childbirth. Past pelvic surgery. Lower levels of the hormone estrogen due to menopause. Consistently lifting more than 50 lb (23 kg). Obesity. Long-term difficulty passing stool (chronic constipation). Long-term, or chronic, cough. Fluid buildup in the abdomen due to certain conditions. What are the signs or symptoms? Symptoms of this condition include: Leaking a little urine (loss of bladder control) when you cough, sneeze, strain, and exercise (stress incontinence). This may be worse immediately after childbirth. It may gradually improve over time. Feeling pressure in your pelvis or vagina. This pressure may increase when you cough or when you are passing stool. A bulge that protrudes from the opening of your vagina. Difficulty passing urine or stool. Pain in your lower back. Pain or discomfort during sex, or decreased interest in sex. Repeated bladder infections (urinary tract infections). Difficulty inserting a tampon. In some people, this condition causes no symptoms. How is this diagnosed? This condition may be diagnosed based on a vaginal and rectal exam. During the exam, you may be asked to cough and strain while you are lying down, sitting, and standing up. Your health care provider will determine if  other tests are required, such as bladder function tests. How is this treated? Treatment for this condition may depend on your symptoms. Treatment may include: Lifestyle changes, such as drinking plenty of fluids and eating foods that are high in fiber. Emptying your bladder at scheduled times (bladder training therapy). This can help reduce or avoid urinary incontinence. Estrogen. This may help mild prolapse by increasing the strength and tone of pelvic floor muscles. Kegel exercises. These may help mild cases of prolapse by strengthening and tightening the muscles of the pelvic floor. A soft, flexible device that helps support the vaginal walls and keep pelvic organs in place (pessary). This is inserted into your vagina by your health care provider. Surgery. This is often the only form of treatment for severe prolapse. Follow these instructions at home: Eating and drinking Avoid drinking beverages that contain caffeine or alcohol. Increase your intake of high-fiber foods to decrease constipation and straining during bowel movements. Activity Lose weight if recommended by your health care provider. Avoid heavy lifting and straining with exercise and work. Do not hold your breath when you perform mild to moderate lifting and exercise activities. Limit your activities as directed by your health care provider. Do Kegel exercises as directed by your health care provider. To do this: Squeeze your pelvic floor muscles tight. You should feel a tight lift in your rectal area and a tightness in your vaginal area. Keep your stomach, buttocks, and legs relaxed. Hold the muscles tight for up to 10 seconds. Then relax your muscles. Repeat this exercise 50 times a day, or as much as told by your health care provider. Continue to do this exercise for at least 4-6 weeks, or for as long as told by your health care provider.  General instructions Take over-the-counter and prescription medicines only as told by  your health care provider. Wear a sanitary pad or adult diapers if you have urinary incontinence. If you have a pessary, take care of it as told by your health care provider. Keep all follow-up visits. This is important. Contact a health care provider if you: Have symptoms that interfere with your daily activities or sex life. Need medicine to help with the discomfort. Notice bleeding from your vagina that is not related to your menstrual period. Have a fever. Have pain or bleeding when you urinate. Have bleeding when you pass stool. Pass urine when you have sex. Have chronic constipation. Have a pessary that falls out. Have a foul-smelling vaginal discharge. Have an unusual, low pain in your abdomen. Get help right away if you: Cannot pass urine. Summary Pelvic organ prolapse is the stretching, bulging, or dropping of pelvic organs into an abnormal position. It happens when the muscles and tissues that surround and support pelvic structures become weak or stretched. When organs other than the vagina are involved, they often bulge into the vagina or protrude from it, depending on how severe the prolapse is. In most cases, this condition needs to be treated only if it produces symptoms. Treatment may include lifestyle changes, estrogen, Kegel exercises, pessary insertion, or surgery. Avoid heavy lifting and straining with exercise and work. Do not hold your breath when you perform mild to moderate lifting and exercise activities. Limit your activities as directed by your health care provider. This information is not intended to replace advice given to you by your health care provider. Make sure you discuss any questions you have with your health care provider. Document Revised: 10/07/2019 Document Reviewed: 10/07/2019 Elsevier Patient Education  2024 [employer]  Colpocleisis Colpocleisis, also called colpectomy, is a surgery to partially or completely remove and stitch (suture) the  vagina closed, including the opening. This procedure may be done to help with problems caused by the falling down (prolapse) of one or more pelvic organs. Prolapse could involve the vagina, uterus, bladder, rectum, or intestines. It is often caused by previous pregnancies and pelvic surgery, heavy straining and lifting for long periods of time, long-term, or chronic, constipation with straining, obesity, and aging. Colpocleisis may be done in women who: Have stopped menstruating. Have already had the uterus removed (had a hysterectomy). No longer plan to have vaginal sex. After this surgery, your vagina will not be deep enough to permit vaginal intercourse. Have medical problems that make more advanced surgery very risky. Tell a health care provider about: Any allergies you have. All medicines you are taking, including vitamins, herbs, eye drops, creams, and over-the-counter medicines. Any problems you or family members have had with anesthetic medicines. Any blood disorders you have. Any surgeries you have had. Any medical conditions you have. Any colds or infections you have had recently. What are the risks? Generally, this is a safe procedure. However, problems may occur, including: Infection. Bleeding. Allergic reaction to medicines. Damage to nearby structures or organs. Blood clots in the legs or lungs. What happens before the procedure? Staying hydrated Follow instructions from your health care provider about hydration, which may include: Up to 2 hours before the procedure - you may continue to drink clear liquids, such as water, clear fruit juice, black coffee, and plain tea.  Eating and drinking restrictions Follow instructions from your health care provider about eating and drinking, which may include: 8 hours before the procedure - stop eating  heavy meals or foods, such as meat, fried foods, or fatty foods. 6 hours before the procedure - stop eating light meals or foods, such  as toast or cereal. 6 hours before the procedure - stop drinking milk or drinks that contain milk. 2 hours before the procedure - stop drinking clear liquids. Medicines Ask your health care provider about: Changing or stopping your regular medicines. This is especially important if you are taking diabetes medicines or blood thinners. Taking medicines such as aspirin and ibuprofen. These medicines can thin your blood. Do not take these medicines unless your health care provider tells you to take them. Taking over-the-counter medicines, vitamins, herbs, and supplements. General instructions Do not use any products that contain nicotine or tobacco for at least 4 weeks before the procedure. These products include cigarettes, chewing tobacco, and vaping devices, such as e-cigarettes. If you need help quitting, ask your health care provider. Ask your health care provider what steps will be taken to help prevent infection. These may include: Removing hair at the surgery site. Washing skin with a germ-killing soap. Receiving antibiotic medicine. Plan to have a responsible adult take you home from the hospital or clinic. Plan to have a responsible adult care for you for the time you are told after you leave the hospital or clinic. This is important. What happens during the procedure?  An IV will be inserted into one of your veins. You will be given one or more of the following: A medicine to help you relax (sedative). A medicine to make you fall asleep (general anesthetic). A medicine that is injected into your spine to numb the area below and slightly above the injection site (spinal anesthetic). The prolapsed organs will be put back to their normal position. This will be done by hand through the vagina. A small incision will be made in the vagina, and the top and bottom of the vagina will be removed. No incision in the abdomen will be needed. The opening of the vagina will be closed using sutures  that dissolve in 1-2 months. The procedure may vary among health care providers and hospitals. What happens after the procedure? Your blood pressure, heart rate, breathing rate, and blood oxygen level will be monitored until you leave the hospital or clinic. You will still have an IV in your vein. This will be removed before you go home. You will also have a small, thin tube (catheter) in your bladder to keep your bladder empty. It will likely be removed the day after surgery. Your health care provider will talk with you about when this can be removed. They will give you any special instructions that you need. You may be given an antibiotic to help prevent infection. You may be given pain medicine. Do not drive or operate machinery until your health care provider says that it is safe. Summary Colpocleisis, or colpectomy, is a surgical procedure to partially or completely remove and stitch (suture) the vagina closed, including the opening. This procedure may be done to help with problems caused by the falling down (prolapse) of one or more pelvic organs. Before the procedure, ask your health care provider about changing or stopping your regular medicines. After the procedure, your blood pressure, heart rate, breathing rate, and blood oxygen level will be monitored until you leave the hospital or clinic. This information is not intended to replace advice given to you by your health care provider. Make sure you discuss any questions you have with your health care  provider. Document Revised: 02/11/2020 Document Reviewed: 11/23/2019 Elsevier Patient Education  2024 [employer].

## 2023-06-13 ENCOUNTER — Telehealth: Payer: Self-pay | Admitting: Hematology and Oncology

## 2023-06-13 NOTE — Telephone Encounter (Signed)
Patient called to move lab appointment up due to weather on Thursday.

## 2023-06-13 NOTE — Telephone Encounter (Signed)
Referral order placed.   Routing to [employer].   Encounter closed.

## 2023-06-14 ENCOUNTER — Inpatient Hospital Stay: Payer: Medicare Other | Attending: Hematology and Oncology

## 2023-06-14 DIAGNOSIS — K922 Gastrointestinal hemorrhage, unspecified: Secondary | ICD-10-CM | POA: Diagnosis present

## 2023-06-14 DIAGNOSIS — D509 Iron deficiency anemia, unspecified: Secondary | ICD-10-CM

## 2023-06-14 DIAGNOSIS — D72819 Decreased white blood cell count, unspecified: Secondary | ICD-10-CM | POA: Insufficient documentation

## 2023-06-14 DIAGNOSIS — D500 Iron deficiency anemia secondary to blood loss (chronic): Secondary | ICD-10-CM | POA: Insufficient documentation

## 2023-06-14 DIAGNOSIS — D539 Nutritional anemia, unspecified: Secondary | ICD-10-CM

## 2023-06-14 LAB — VITAMIN B12: Vitamin B-12: 261 pg/mL (ref 180–914)

## 2023-06-14 LAB — CBC WITH DIFFERENTIAL/PLATELET
Abs Immature Granulocytes: 0.01 10*3/uL (ref 0.00–0.07)
Basophils Absolute: 0.1 10*3/uL (ref 0.0–0.1)
Basophils Relative: 2 %
Eosinophils Absolute: 0.3 10*3/uL (ref 0.0–0.5)
Eosinophils Relative: 7 %
HCT: 35.8 % — ABNORMAL LOW (ref 36.0–46.0)
Hemoglobin: 10.9 g/dL — ABNORMAL LOW (ref 12.0–15.0)
Immature Granulocytes: 0 %
Lymphocytes Relative: 45 %
Lymphs Abs: 1.5 10*3/uL (ref 0.7–4.0)
MCH: 27 pg (ref 26.0–34.0)
MCHC: 30.4 g/dL (ref 30.0–36.0)
MCV: 88.8 fL (ref 80.0–100.0)
Monocytes Absolute: 0.4 10*3/uL (ref 0.1–1.0)
Monocytes Relative: 10 %
Neutro Abs: 1.2 10*3/uL — ABNORMAL LOW (ref 1.7–7.7)
Neutrophils Relative %: 36 %
Platelets: 226 10*3/uL (ref 150–400)
RBC: 4.03 MIL/uL (ref 3.87–5.11)
RDW: 15.9 % — ABNORMAL HIGH (ref 11.5–15.5)
WBC: 3.5 10*3/uL — ABNORMAL LOW (ref 4.0–10.5)
nRBC: 0 % (ref 0.0–0.2)

## 2023-06-14 LAB — FERRITIN: Ferritin: 38 ng/mL (ref 11–307)

## 2023-06-14 LAB — IRON AND IRON BINDING CAPACITY (CC-WL,HP ONLY)
Iron: 41 ug/dL (ref 28–170)
Saturation Ratios: 11 % (ref 10.4–31.8)
TIBC: 367 ug/dL (ref 250–450)
UIBC: 326 ug/dL (ref 148–442)

## 2023-06-15 ENCOUNTER — Other Ambulatory Visit: Payer: Medicare Other

## 2023-06-15 LAB — CYTOLOGY - PAP
Comment: NEGATIVE
Diagnosis: UNDETERMINED — AB
High risk HPV: NEGATIVE

## 2023-06-20 ENCOUNTER — Encounter: Payer: Self-pay | Admitting: Obstetrics and Gynecology

## 2023-06-22 ENCOUNTER — Inpatient Hospital Stay: Payer: Medicare Other

## 2023-06-22 ENCOUNTER — Inpatient Hospital Stay (HOSPITAL_BASED_OUTPATIENT_CLINIC_OR_DEPARTMENT_OTHER): Payer: Medicare Other | Admitting: Hematology and Oncology

## 2023-06-22 VITALS — BP 133/63 | HR 64 | Temp 98.1°F | Resp 18 | Ht 66.0 in | Wt 161.4 lb

## 2023-06-22 DIAGNOSIS — D500 Iron deficiency anemia secondary to blood loss (chronic): Secondary | ICD-10-CM | POA: Diagnosis not present

## 2023-06-22 DIAGNOSIS — D649 Anemia, unspecified: Secondary | ICD-10-CM | POA: Diagnosis not present

## 2023-06-22 DIAGNOSIS — D72819 Decreased white blood cell count, unspecified: Secondary | ICD-10-CM

## 2023-06-23 ENCOUNTER — Telehealth: Payer: Self-pay | Admitting: Pharmacy Technician

## 2023-06-23 ENCOUNTER — Encounter: Payer: Self-pay | Admitting: Hematology and Oncology

## 2023-06-23 DIAGNOSIS — D500 Iron deficiency anemia secondary to blood loss (chronic): Secondary | ICD-10-CM | POA: Insufficient documentation

## 2023-06-23 NOTE — Assessment & Plan Note (Addendum)
 She has multifactorial anemia She has chronic intermittent GI bleed She is somewhat symptomatic with fatigue from anemia There is no role for darbepoetin injection I recommend 1 dose of intravenous iron infusion and she prefers to have it done on the different day and I will schedule it to be given at [facility] I plan to repeat labs again in 2-3 months for further follow-up

## 2023-06-23 NOTE — Assessment & Plan Note (Addendum)
She has chronic intermittent leukopenia This has been present since 2007, unknown etiology Recent repeat B12 level in July 2023 was adequate She is not symptomatic Observe for now

## 2023-06-23 NOTE — Telephone Encounter (Signed)
 Auth Submission: NO AUTH NEEDED Site of care: Site of care: [facility] Payer: [employer] Medication & CPT/J Code(s) submitted: Venofer (Iron Sucrose) J1756 Route of submission (phone, fax, portal):  Phone # Fax # Auth type: Buy/Bill PB Units/visits requested: 400mg  x1 dose Reference number:  Approval from: 06/23/23 to 11/20/23

## 2023-06-23 NOTE — Progress Notes (Signed)
 [facility] OFFICE PROGRESS NOTE  Patient Care Team: Panosh, Neta Mends, MD as PCP - General (Internal Medicine) Rennis Golden Lisette Abu, MD as PCP - Cardiology (Cardiology) Ernesto Rutherford, MD (Ophthalmology) Filbert Berthold, DDS (Dentistry) Cherlyn Roberts, MD as Consulting Physician (Dermatology) [name], MD as Consulting Physician (Hematology and Oncology) Ollen Gross, MD as Consulting Physician (Orthopedic Surgery)  Assessment & Plan Iron deficiency anemia due to chronic blood loss  Symptomatic anemia She has multifactorial anemia She has chronic intermittent GI bleed She is somewhat symptomatic with fatigue from anemia There is no role for darbepoetin injection I recommend 1 dose of intravenous iron infusion and she prefers to have it done on the different day and I will schedule it to be given at [facility] I plan to repeat labs again in 2-3 months for further follow-up Leukopenia, unspecified type 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

## 2023-06-27 ENCOUNTER — Ambulatory Visit

## 2023-06-27 ENCOUNTER — Telehealth: Payer: Self-pay

## 2023-06-27 VITALS — BP 196/80 | HR 55 | Temp 97.7°F | Resp 14 | Ht 66.0 in | Wt 164.0 lb

## 2023-06-27 DIAGNOSIS — D649 Anemia, unspecified: Secondary | ICD-10-CM | POA: Diagnosis not present

## 2023-06-27 DIAGNOSIS — D500 Iron deficiency anemia secondary to blood loss (chronic): Secondary | ICD-10-CM

## 2023-06-27 MED ORDER — IRON SUCROSE 400 MG IVPB - SIMPLE MED: 400.0000 mg | Freq: Once | Status: DC

## 2023-06-27 MED ORDER — METHYLPREDNISOLONE SODIUM SUCC 40 MG IJ SOLR
40.0000 mg | Freq: Once | INTRAMUSCULAR | Status: AC
  Administered 2023-06-27: 40 mg via INTRAVENOUS

## 2023-06-27 MED ORDER — IRON SUCROSE 20 MG/ML IV SOLN
400.0000 mg | Freq: Once | INTRAVENOUS | Status: AC
  Administered 2023-06-27: 400 mg via INTRAVENOUS
  Filled 2023-06-27: qty 20

## 2023-06-27 MED ORDER — SODIUM CHLORIDE 0.9 % IV SOLN: Freq: Once | INTRAVENOUS | Status: DC | PRN

## 2023-06-27 MED ORDER — EPINEPHRINE 0.3 MG/0.3ML IJ SOAJ: 0.3000 mg | Freq: Once | INTRAMUSCULAR | Status: DC | PRN

## 2023-06-27 MED ORDER — FAMOTIDINE IN NACL 20-0.9 MG/50ML-% IV SOLN
20.0000 mg | Freq: Once | INTRAVENOUS | Status: AC | PRN
  Administered 2023-06-27: 20 mg via INTRAVENOUS

## 2023-06-27 MED ORDER — DIPHENHYDRAMINE HCL 50 MG/ML IJ SOLN: 50.0000 mg | Freq: Once | INTRAMUSCULAR | Status: DC | PRN

## 2023-06-27 MED ORDER — ALBUTEROL SULFATE HFA 108 (90 BASE) MCG/ACT IN AERS: 2.0000 | INHALATION_SPRAY | Freq: Once | RESPIRATORY_TRACT | Status: DC | PRN

## 2023-06-27 MED ORDER — METHYLPREDNISOLONE SODIUM SUCC 125 MG IJ SOLR: 125.0000 mg | Freq: Once | INTRAMUSCULAR | Status: DC | PRN

## 2023-06-27 NOTE — Progress Notes (Signed)
 Diagnosis: Iron Deficiency Anemia  Provider:  Chilton Greathouse MD  Procedure: IV Infusion  IV Type: Peripheral, IV Location: R Forearm  Venofer (Iron Sucrose), Dose: 400 mg  Infusion Start Time: 1305  Infusion Stop Time: 1548  Post Infusion IV Care: Observation period completed, Peripheral IV Discontinued, and Patient experienced symptoms at end of infusion - see below.  Discharge: Condition: Fair, Destination: Home . AVS Provided  Performed by:  Wyvonne Lenz, RN    At  [id number] at the end of the infusion , patient complained of symptoms including  dizziness, mild chest tightness and "fluttery" feeling in chest. Patient stated she "just felt weird"  . Patient stated the symptoms had started approximately 20 minutes prior to the end of the infusion. Patient stated her lips began tingling, but she denied any difficulty breathing or swallowing, denied any itching or any feeling of swelling in face/mouth/throat. Patient did not take pre-medications prior to infusion (no pre-medications were ordered). Emergency protocols initiated and ordering provider Dr. [name] MD notified via secure message and responded. Per order from Dr. Bertis Ruddy, 40 mg IVP Solumedrol administered at 1607 and 20 mg IVPB Pepcid started at 1608 at 100 mL/hour. Vital signs stable. Patient stated symptoms improving. At 1608 patient stated she felt there was some swelling in her right fingertips, but stated it was improving at 1610. Patient monitored for an additional 30 minutes per order from Dr. Bertis Ruddy. At 1625 patient stated "I feel a lot better". Observation completed at 1659. Patient stated that symptoms had resolved. Family/signficant other was not notified per patient request. Patient was educated to go to the Emergency Department for any new onset or worsening of symptoms. Per Dr. Bertis Ruddy, relayed to patient that provider stated she could take 20 mg OTC Pepcid or 25 mg Benadryl as needed tonight. Patient verbalized  understanding.

## 2023-06-27 NOTE — Patient Instructions (Signed)
 Patient educated to go to ER/Call 911 if symptoms reoccur. Primary Dr to follow up with patient on 06/28/23.

## 2023-06-27 NOTE — Telephone Encounter (Signed)
 Pt in clinic for Venofer infusion. at completion , pt now complaining of chest tightness, dizziness, and "feeling weird". Emergency protocol initiated. Any further instructions? Please advise.  18 mins NG [name], MD what is given from the emergency protocol? pepcid and steroids are usually given here  11 mins tingling in lips, chest pain resolved. We actually did not give the IV Benadryl or solumedrol. Would you like for [place] to give?  8 mins [name], MD typically pepcid 20 mg and solumderol 40 mg X 1. advise her she can take more pepcid OTC 20 mg or benadryl 25 mg tonight as needed.  I will call her tomorrow to check on her  NG please enter venofer as allergic reaction, will give her different formula in the future  6 mins ok  6 mins Wyvonne Lenz, RN was added by you. 3 mins how long should me monitor   Now NG [name], MD 30 mins

## 2023-06-28 ENCOUNTER — Other Ambulatory Visit: Payer: Self-pay | Admitting: Hematology and Oncology

## 2023-06-28 ENCOUNTER — Telehealth: Payer: Self-pay

## 2023-06-28 ENCOUNTER — Encounter: Payer: Self-pay | Admitting: Hematology and Oncology

## 2023-06-28 NOTE — Telephone Encounter (Signed)
 Called pt per MD. She reports she is feeling better today after reaction from Fe infusion. She states her BP has improved and she no longer feels lip tingling or dizziness. Advised pt to call us if she should develop any new sx. She is agreeable.

## 2023-06-28 NOTE — Telephone Encounter (Signed)
-----   Message from [name] sent at 06/28/2023 10:17 AM EST ----- Hi   She had reaction to IV iron yesterday Can you call and check on her?

## 2023-07-11 ENCOUNTER — Encounter: Payer: Self-pay | Admitting: Internal Medicine

## 2023-07-11 ENCOUNTER — Ambulatory Visit: Payer: Medicare Other | Admitting: Internal Medicine

## 2023-07-11 VITALS — BP 128/60 | HR 56 | Temp 98.4°F | Ht 66.0 in | Wt 161.0 lb

## 2023-07-11 DIAGNOSIS — J302 Other seasonal allergic rhinitis: Secondary | ICD-10-CM

## 2023-07-11 DIAGNOSIS — Z87898 Personal history of other specified conditions: Secondary | ICD-10-CM

## 2023-07-11 DIAGNOSIS — R9389 Abnormal findings on diagnostic imaging of other specified body structures: Secondary | ICD-10-CM | POA: Diagnosis not present

## 2023-07-11 DIAGNOSIS — R0989 Other specified symptoms and signs involving the circulatory and respiratory systems: Secondary | ICD-10-CM

## 2023-07-11 DIAGNOSIS — J3081 Allergic rhinitis due to animal (cat) (dog) hair and dander: Secondary | ICD-10-CM | POA: Diagnosis not present

## 2023-07-11 NOTE — Patient Instructions (Signed)
 ICD-10-CM   1. History of wheezing  Z87.898     2. Abnormal CXR (chest x-ray)  R93.89     3. Bibasilar crackles  R09.89     4. 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 Seasonal allergies  J30.2      Possible bronchiectasisis Though crackles doubt  you have pulmonary fibrosis but this requires rule out  PLAN Do cbc with diff Do RAST allergy panel Do  PFT Do HRCT supine and prone  Followup  Belinda Harper or APP in 4-8 weeks but after completing all of above

## 2023-07-11 NOTE — Progress Notes (Signed)
 OV 07/11/2023  Subjective:  Patient ID: Belinda Harper, female , DOB: 12/07/1940 , age 83 y.o. , MRN: [id number] , ADDRESS: [address] [place] [address] PCP Fabian Sharp, Neta Mends, MD Patient Care Team: Madelin Headings, MD as PCP - General (Internal Medicine) Rennis Golden Lisette Abu, MD as PCP - Cardiology (Cardiology) Ernesto Rutherford, MD (Ophthalmology) Filbert Berthold, DDS (Dentistry) Cherlyn Roberts, MD as Consulting Physician (Dermatology) [name], MD as Consulting Physician (Hematology and Oncology) Ollen Gross, MD as Consulting Physician (Orthopedic Surgery) Chilton Greathouse, MD as Consulting Physician (Pulmonary Disease)  This Provider for this visit: Treatment Team:  Attending Provider: Kalman Shan, MD    07/11/2023 -   Chief Complaint  Patient presents with   Pulmonary Consult    Referred by Dr. Berniece Andreas for eval of asthma. She had asthma as a child and stopped having symptoms around age 44. She started wheezing at night 3-4 months ago.      HPI KEANI GOTCHER 34 y.o. -ex-wife of Mr. Karrine Kluttz who is my patient with COPD.  Patient herself has chronic scoliosis.  She is a primary patient of Dr. Fabian Sharp.  She states that she was a passive smoker.  This is because apparent smoke and then when she was married to Mr. Thresa Dozier between 5784 and 2003 he smoked although he quit sometime in the 1980s.  She herself does not have any pulmonary issues.  Although as a child when she was 18 she is to have nocturnal wheezing and she had a cat allergy.  She still believes she has cat allergy even though she has a cat.  She also has seasonal allergies but she manages with a cough that happens in the springtime.  Her most recently for the last 3 to 4 months she started noticing new onset nocturnal wheezing.  There is no cough there is no shortness of breath.  She has chronic venous stasis edema for the last few years without any change.  So she saw her primary care  doctor.  They did a chest x-ray that I personally visualized and shows hyperinflated lungs and basal atelectasis.  She has chronic scoliosis and does concern for bronchiectasis in the lower base [she has crackles here] therefore she is being referred here.  Other medical issues include -chronic scoliosis - Chronic anemia hemoglobin between 8 and 10 g% - Never had MI.  No A-fib no stroke no cancer no diabetes - Does have history of hypertension - Does have seasonal allergies - Skin test +6 years ago - History of cat allergy positivity.  Not otherwise specified.     Latest Reference Range & Units 09/20/06 09:34 04/06/07 08:49 05/19/08 10:01 10/21/08 16:11 03/20/09 16:07 04/10/09 11:16 04/10/09 17:07 04/20/09 11:15 05/18/09 10:31 06/22/09 10:09 01/27/10 14:19 06/28/10 11:18 02/11/11 15:30 02/23/11 16:22 04/04/11 15:55 08/03/11 12:10 09/28/11 16:00 01/18/12 16:02 05/28/12 16:00 07/18/12 16:00 11/21/12 16:09 02/20/13 15:02 05/15/13 15:03 08/22/13 14:19 11/15/13 09:55 12/11/13 09:29 09/24/14 09:41 11/03/14 15:33 02/05/15 18:35 02/17/15 14:56 02/18/15 17:56 09/30/15 09:44 03/30/16 15:08 08/10/16 15:35 10/18/16 09:44 10/20/17 11:10 04/23/18 16:34 04/26/18 10:02 11/21/18 12:15 03/25/19 10:50 05/22/19 13:57 12/06/19 11:47 01/27/20 15:26 02/13/20 09:02 06/10/20 10:30 07/29/20 12:01 11/05/20 12:11 11/27/20 09:50 12/21/20 10:12 02/02/21 12:12 03/23/21 11:59 05/17/21 12:05 05/28/21 08:02 06/10/21 11:42 06/25/21 08:14 07/16/21 07:55 08/09/21 08:11 09/06/21 10:21 10/11/21 08:38 11/08/21 10:53 11/18/21 08:48 11/29/21 09:10 12/13/21 09:11 12/30/21 09:01 01/13/22 08:59 01/27/22 08:54 02/24/22 08:53 05/26/22 08:56 06/16/22 09:20 09/15/22 08:52 11/21/22  13:51 01/12/23 08:51 03/15/23 14:46 04/06/23 09:35 05/18/23 09:19 06/14/23 07:20  Eosinophils Absolute 0.0 - 0.5 K/uL 0.1 0.2 0.1 0.1 0.1 0.1 0.1 0.3 0.1 0.1 0.1 0.1 0.1 0.1 0.2 0.2 0.2 0.1 0.2 0.2 0.2 0.2 0.1 0.2 0.1 0.1 0.0 0.2 0.0 0.1 0.1 0.1 0.3 0.2 0.1 0.1 0.2 0.2 0.2  0.1 0.2 152 0.1 0.2 0.2 0.2 0.2 0.1 0.2 0.1 0.1 0.2 0.2 0.1 0.2 0.3 0.3 0.2 0.2 0.2 0.2 0.2 0.2 0.2 0.2 0.2 0.2 0.3 0.3 0.2 0.1 0.2 0.2 0.2 0.3 0.3     arrative & Impression  CLINICAL DATA:  83 year old female with wheezing   EXAM: CHEST - 2 VIEW   COMPARISON:  03/03/2020   FINDINGS: Cardiomediastinal silhouette unchanged in size and contour. No evidence of central vascular congestion. No interlobular septal thickening.   Stigmata of emphysema, with increased retrosternal airspace, flattened hemidiaphragms, increased AP diameter, and hyperinflation on the AP view.   Bronchiectasis with chronic interstitial opacities. Bilateral architectural distortion is similar to the prior.   No new airspace disease.   No pneumothorax or pleural effusion.   No acute displaced fracture. Degenerative changes of the spine and scoliosis.   IMPRESSION: Emphysema, chronic changes and bronchiectasis, without definite evidence of acute cardiopulmonary disease     Electronically Signed   By: Gilmer Mor D.O.   On: 03/31/2023 16:18    PFT      No data to display             LAB RESULTS last 96 hours No results found.       has a past medical history of Allergic rhinitis, Anxiety, Bradycardia, Chronic constipation, Chronic gastritis, Depression, Family history of adverse reaction to anesthesia, GERD (gastroesophageal reflux disease), Heart palpitations, Hemorrhoids, History of asthma, History of sepsis (02/05/2015), Hyperlipidemia, Hypertension, Iron deficiency anemia secondary to blood loss (chronic), Leukopenia, OA (osteoarthritis), Osteoporosis, Presence of pessary, PSVT (paroxysmal supraventricular tachycardia) (HCC), Rectal bleeding, Uterine prolapse, and Wears glasses.   reports that she has never smoked. She has been exposed to tobacco smoke. She has never used smokeless tobacco.  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; Rotation Flap;  Surgeon: Nicki Reaper, MD;  Location: [facility];  Service: Orthopedics;  Laterality: Left;   ORIF ANKLE FRACTURE Left 03/21/2009   @[facility]   by dr Lequita Halt;   fracture and dislocation   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 , saw injury   TRANSANAL HEMORRHOIDAL DEARTERIALIZATION N/A 10/15/2021   Procedure: TRANSANAL HEMORRHOIDAL DEARTERIALIZATION;  Surgeon: Romie Levee, MD;  Location: [facility] [place];  Service: General;  Laterality: N/A;    Allergies  Allergen Reactions   Sulfamethoxazole Rash   Venofer [Iron Sucrose] Other (See Comments)    Chest pain, tingling around mouth, lightheaded   Nsaids Other (See Comments)    Drops hemoglobin levels   Sulfa Antibiotics Rash   Yellow Jacket Venom     Other reaction(s): decreased blood pressure, dizziness    Immunization History  Administered  Date(s) Administered   Fluad Quad(high Dose 65+) 03/02/2021, 03/02/2022   Influenza Whole 03/10/2009, 01/27/2010   Influenza, High Dose Seasonal PF 03/23/2016, 04/24/2017, 02/09/2018, 03/19/2019   Influenza,inj,Quad PF,6+ Mos 04/08/2020   Influenza-Unspecified 03/20/2023   PFIZER(Purple Top)SARS-COV-2 Vaccination 02/12/2020, 03/04/2020   Pneumococcal Conjugate-13 12/18/2013   Pneumococcal Polysaccharide-23 04/25/2006, 10/18/2012   Td 04/25/1997, 06/22/2009   Tdap 03/20/2023   Zoster Recombinant(Shingrix) 11/01/2017, 01/04/2018   Zoster, Live 06/28/2010    Family History  Problem Relation Age of Onset    Hyperlipidemia Mother    Heart disease Mother        CABG age 29   Ovarian cancer Mother    Uterine cancer Mother    Arthritis Mother    Osteoporosis Mother    Emphysema Mother    Heart disease Father        CABG age 40   Arthritis Father    Melanoma Father      Current Outpatient Medications:    amLODipine (NORVASC) 5 MG tablet, Take 1 tablet (5 mg total) by mouth daily., Disp: 90 tablet, Rfl: 3   Calcium Citrate-Vitamin D (CITRACAL + D PO), Take 2 tablets by mouth in the morning and at bedtime. Take 2 tablets po a.m and at lunch. 800 mg calcium, Disp: , Rfl:    cyanocobalamin (VITAMIN B12) 1000 MCG tablet, Take 1,000 mcg by mouth daily., Disp: , Rfl:    DARBEPOETIN ALFA IJ, Inject as directed as needed., Disp: , Rfl:    denosumab (PROLIA) 60 MG/ML SOSY injection, Inject 60 mg into the skin every 6 (six) months., Disp: , Rfl:    docusate sodium (COLACE) 100 MG capsule, Take 200 mg by mouth every morning., Disp: , Rfl:    estradiol (ESTRACE) 0.1 MG/GM vaginal cream, Use 1/2 g vaginally three times per week as needed., Disp: 42.5 g, Rfl: 1   Magnesium Glycinate 665 MG CAPS, Take 200 mg by mouth daily., Disp: , Rfl:    Menaquinone-7 (K2 PO), Take by mouth., Disp: , Rfl:    metoprolol succinate (TOPROL-XL) 25 MG 24 hr tablet, TAKE 1/2 TABLET (12.5 MG TOTAL) BY MOUTH AT BEDTIME *SCHEDULE APPOINTMENT FOR FURTHER REFILLS*, Disp: 45 tablet, Rfl: 3   Wheat Dextrin (BENEFIBER DRINK MIX PO), Take 1 Scoop by mouth daily., Disp: , Rfl:       Objective:   Vitals:   07/11/23 1528  Pulse: (!) 56  Temp: 98.4 F (36.9 C)  TempSrc: Oral  SpO2: 97%  Weight: 161 lb (73 kg)  Height: 5\' 6"  (1.676 m)    Estimated body mass index is 25.99 kg/m as calculated from the following:   Height as of this encounter: 5\' 6"  (1.676 m).   Weight as of this encounter: 161 lb (73 kg).  @WEIGHTCHANGE @  Filed Weights   07/11/23 1528  Weight: 161 lb (73 kg)     Physical Exam   General: No distress.  Looks well O2 at rest: no Cane present: no Sitting in wheel chair: no Frail: no Obese: no Neuro: Alert and Oriented x 3. GCS 15. Speech normal Psych: Pleasant Resp:  Barrel Chest - no.  Wheeze - no, Crackles - YES BILATERAL BASE, No overt respiratory distress CVS: Normal heart sounds. Murmurs - no Ext: Stigmata of Connective Tissue Disease - no HEENT: Normal upper airway. PEERL +. No post nasal drip        Assessment:       ICD-10-CM   1. History of wheezing  Z87.898 CBC w/Diff  Perennial allergen profile IgE    Pulmonary function test    CT Chest High Resolution    2. Abnormal CXR (chest x-ray)  R93.89 CBC w/Diff    Perennial allergen profile IgE    Pulmonary function test    CT Chest High Resolution    3. Bibasilar crackles  R09.89 CBC w/Diff    Perennial allergen profile IgE    Pulmonary function test    CT Chest High Resolution    4. Airborne cat allergy  J30.81 CBC w/Diff    Perennial allergen profile IgE    Pulmonary function test    CT Chest High Resolution    5. Seasonal allergies  J30.2 CBC w/Diff    Perennial allergen profile IgE    Pulmonary function test    CT Chest High Resolution         Plan:     Patient Instructions     ICD-10-CM   1. History of wheezing  Z87.898     2. Abnormal CXR (chest x-ray)  R93.89     3. Bibasilar crackles  R09.89     4. 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 Seasonal allergies  J30.2      Possible bronchiectasisis Though crackles doubt  you have pulmonary fibrosis but this requires rule out  PLAN Do cbc with diff Do RAST allergy panel Do  PFT Do HRCT supine and prone  Followup  Francetta Ilg or APP in 4-8 weeks but after completing all of above   FOLLOWUP Return in about 7 weeks (around 08/29/2023) for 15 min visit, with Dr Marchelle Gearing, Face to Face Visit, after tests.    SIGNATURE    Dr. Kalman Shan, M.D., F.C.C.P,  Pulmonary and Critical Care Medicine Staff Physician, [facility] Center Director - Interstitial Lung Disease  Program  Pulmonary Fibrosis [facility] [place], [place], [address]  Pager: [phone], If no answer or between  15:00h - 7:00h: call [phone] Telephone: [phone]  4:15 PM 07/11/2023

## 2023-07-12 LAB — CBC WITH DIFFERENTIAL/PLATELET
Basophils Absolute: 0 10*3/uL (ref 0.0–0.1)
Basophils Relative: 0.8 % (ref 0.0–3.0)
Eosinophils Absolute: 0.2 10*3/uL (ref 0.0–0.7)
Eosinophils Relative: 6.4 % — ABNORMAL HIGH (ref 0.0–5.0)
HCT: 34.8 % — ABNORMAL LOW (ref 36.0–46.0)
Hemoglobin: 11.1 g/dL — ABNORMAL LOW (ref 12.0–15.0)
Lymphocytes Relative: 26.6 % (ref 12.0–46.0)
Lymphs Abs: 1 10*3/uL (ref 0.7–4.0)
MCHC: 31.8 g/dL (ref 30.0–36.0)
MCV: 86 fl (ref 78.0–100.0)
Monocytes Absolute: 0.3 10*3/uL (ref 0.1–1.0)
Monocytes Relative: 8.5 % (ref 3.0–12.0)
Neutro Abs: 2.1 10*3/uL (ref 1.4–7.7)
Neutrophils Relative %: 57.7 % (ref 43.0–77.0)
Platelets: 207 10*3/uL (ref 150.0–400.0)
RBC: 4.05 Mil/uL (ref 3.87–5.11)
RDW: 17.3 % — ABNORMAL HIGH (ref 11.5–15.5)
WBC: 3.6 10*3/uL — ABNORMAL LOW (ref 4.0–10.5)

## 2023-07-17 LAB — ALLERGEN PROFILE, PERENNIAL ALLERGEN IGE
Alternaria Alternata IgE: <0.1 kU/L
Aspergillus Fumigatus IgE: <0.1 kU/L
Aureobasidi Pullulans IgE: 0.12 kU/L — AB
Candida Albicans IgE: <0.1 kU/L
Cat Dander IgE: 0.73 kU/L — AB
Chicken Feathers IgE: <0.1 kU/L
Cladosporium Herbarum IgE: <0.1 kU/L
Cow Dander IgE: 0.22 kU/L — AB
D Farinae IgE: 6.67 kU/L — AB
D Pteronyssinus IgE: 5.91 kU/L — AB
Dog Dander IgE: 0.35 kU/L — AB
Duck Feathers IgE: <0.1 kU/L
Goose Feathers IgE: <0.1 kU/L
Mouse Urine IgE: <0.1 kU/L
Mucor Racemosus IgE: <0.1 kU/L
Penicillium Chrysogen IgE: <0.1 kU/L
Phoma Betae IgE: 0.1 kU/L — AB
Setomelanomma Rostrat: 0.18 kU/L — AB
Stemphylium Herbarum IgE: 0.16 kU/L — AB

## 2023-08-10 ENCOUNTER — Ambulatory Visit
Admission: RE | Admit: 2023-08-10 | Discharge: 2023-08-10 | Disposition: A | Discharge status: Home / Self Care | Source: Ambulatory Visit | Attending: Internal Medicine | Admitting: Internal Medicine

## 2023-08-10 DIAGNOSIS — Z87898 Personal history of other specified conditions: Secondary | ICD-10-CM

## 2023-08-10 DIAGNOSIS — J302 Other seasonal allergic rhinitis: Secondary | ICD-10-CM

## 2023-08-10 DIAGNOSIS — J3081 Allergic rhinitis due to animal (cat) (dog) hair and dander: Secondary | ICD-10-CM

## 2023-08-10 DIAGNOSIS — R0989 Other specified symptoms and signs involving the circulatory and respiratory systems: Secondary | ICD-10-CM

## 2023-08-10 DIAGNOSIS — R9389 Abnormal findings on diagnostic imaging of other specified body structures: Secondary | ICD-10-CM

## 2023-08-15 ENCOUNTER — Ambulatory Visit (INDEPENDENT_AMBULATORY_CARE_PROVIDER_SITE_OTHER): Admitting: Adult Health

## 2023-08-15 ENCOUNTER — Encounter: Payer: Self-pay | Admitting: Adult Health

## 2023-08-15 ENCOUNTER — Ambulatory Visit (INDEPENDENT_AMBULATORY_CARE_PROVIDER_SITE_OTHER)

## 2023-08-15 VITALS — BP 138/60 | HR 68 | Temp 98.4°F | Ht 66.0 in | Wt 162.0 lb

## 2023-08-15 DIAGNOSIS — M5412 Radiculopathy, cervical region: Secondary | ICD-10-CM

## 2023-08-15 MED ORDER — PREDNISONE 20 MG PO TABS: 20.0000 mg | ORAL_TABLET | Freq: Every day | ORAL | 0 refills | Status: DC

## 2023-08-15 MED ORDER — CYCLOBENZAPRINE HCL 10 MG PO TABS: 10.0000 mg | ORAL_TABLET | Freq: Every day | ORAL | 0 refills | Status: DC

## 2023-08-15 NOTE — Progress Notes (Signed)
 Subjective:    Patient ID: Belinda Harper, female    DOB: 10-29-1940, 83 y.o.   MRN: [id number]  HPI 83 year old female who  has a past medical history of Allergic rhinitis, Anxiety, Bradycardia, Chronic constipation, Chronic gastritis, Depression, Family history of adverse reaction to anesthesia, GERD (gastroesophageal reflux disease), Heart palpitations, Hemorrhoids, History of asthma, History of sepsis (02/05/2015), Hyperlipidemia, Hypertension, Iron  deficiency anemia secondary to blood loss (chronic), Leukopenia, OA (osteoarthritis), Osteoporosis, Presence of pessary, PSVT (paroxysmal supraventricular tachycardia) (HCC), Rectal bleeding, Uterine prolapse, and Wears glasses.  She is a patient of Dr. Ethel Henry who I am seeing today for an acute visit.   She reports that starting about three weeks ago she developed pain around her right shoulder blade with numbness and tingling down her right arm to her fingers tips. She originally thought she may have pulled a muscle but the pain/numbness became worse. Pain is not constant. She reports being able to work in the yard and does not have any pain but will feel tingling in her right hand. Laying on her right side, or driving/riding in a car causes pain with numbness and tingling. She has not noticed any decreased grip strength.   She had tried Tylenol  without relief.    Review of Systems See HPI   Past Medical History:  Diagnosis Date   Allergic rhinitis    Anxiety    Bradycardia    Chronic constipation    Chronic gastritis    followed by dr Leonia Raman (GI);  long hx erosive gastritis with bleed   Depression    Family history of adverse reaction to anesthesia    pt's mother as she gets older organs shut down    GERD (gastroesophageal reflux disease)    Heart palpitations    cardiologist-- dr Maximo Spar;   palpitations w/ intermittant bradycardia;  event monitor 04-01-2020 multiple SVT episodes   Hemorrhoids    s/p banding 05-07-2021 by dr  Leonia Raman   History of asthma    childhood   History of sepsis 02/05/2015   sepsis secondart to cellulitis , left leg/ ankle due to retained ankle hardware   Hyperlipidemia    Hypertension    followed by pcp and cardiology;   normal nuclear stress test 09-28-2012   Iron  deficiency anemia secondary to blood loss (chronic)    oncologist--- dr Marton Sleeper;  long hx anemia secondary gi blood loss;  treated with transfusion's and IV iron    Leukopenia    chronic intermittantly --- followed by dr Marton Sleeper   OA (osteoarthritis)    Osteoporosis    Presence of pessary    PSVT (paroxysmal supraventricular tachycardia) (HCC)    Rectal bleeding    Uterine prolapse    per pt uses pessary   Wears glasses     Social History   Socioeconomic History   Marital status: Divorced    Spouse name: Not on file   Number of children: 2   Years of education: Not on file   Highest education level: Some college, no degree  Occupational History   Occupation: retired  Tobacco Use   Smoking status: Never    Passive exposure: Past   Smokeless tobacco: Never  Vaping Use   Vaping status: Never Used  Substance and Sexual Activity   Alcohol use: Not Currently   Drug use: No   Sexual activity: Not Currently    Birth control/protection: Post-menopausal    Comment: 1st intercourse- 18, partners- 1   Other Topics Concern  Not on file  Social History Narrative   HH of  1   To rent out room    Retired Artist   Never smoked    Pet cat allergic    Divorced   Regular exericise  walking mowing the lawnswimming   Silver sneakers    Social Drivers of Health   Financial Resource Strain: Low Risk  (08/11/2023)   Overall Financial Resource Strain (CARDIA)    Difficulty of Paying Living Expenses: Not hard at all  Food Insecurity: No Food Insecurity (08/11/2023)   Hunger Vital Sign    Worried About Running Out of Food in the Last Year: Never true    Ran Out of Food in the Last Year: Never true   Transportation Needs: No Transportation Needs (08/11/2023)   PRAPARE - Administrator, Civil Service (Medical): No    Lack of Transportation (Non-Medical): No  Physical Activity: Insufficiently Active (08/11/2023)   Exercise Vital Sign    Days of Exercise per Week: 3 days    Minutes of Exercise per Session: 30 min  Stress: No Stress Concern Present (08/11/2023)   [employer] of Occupational Health - Occupational Stress Questionnaire    Feeling of Stress : Only a little  Social Connections: Moderately Isolated (08/11/2023)   Social Connection and Isolation Panel [NHANES]    Frequency of Communication with Friends and Family: More than three times a week    Frequency of Social Gatherings with Friends and Family: Three times a week    Attends Religious Services: More than 4 times per year    Active Member of Clubs or Organizations: No    Attends Banker Meetings: Not on file    Marital Status: Divorced  Intimate Partner Violence: Not At Risk (01/18/2023)   Humiliation, Afraid, Rape, and Kick questionnaire    Fear of Current or Ex-Partner: No    Emotionally Abused: No    Physically Abused: No    Sexually Abused: No    Past Surgical History:  Procedure Laterality Date   CATARACT EXTRACTION W/ INTRAOCULAR LENS IMPLANT Bilateral    right 2000;  left 2020   COLONOSCOPY WITH ESOPHAGOGASTRODUODENOSCOPY (EGD)  12/15/2020   by dr Leonia Raman   HARDWARE REMOVAL Left 02/21/2015   Procedure: IRRIGATION AND DEBRIDEMENT HARDWARE REMOVAL LEFT ANKLE ;  Surgeon: Liliane Rei, MD;  Location: [facility];  Service: Orthopedics;  Laterality: Left;   MASS EXCISION Left 09/11/2012   Procedure: 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; Rotation Flap;  Surgeon: Kemp Patter, MD;  Location: [facility];  Service: Orthopedics;  Laterality: Left;   ORIF ANKLE FRACTURE Left 03/21/2009   @[facility]   by dr France Ina;   fracture and dislocation   REVISION AMPUTATION OF  FINGER  1977   reattach left thumb , saw injury   TRANSANAL HEMORRHOIDAL DEARTERIALIZATION N/A 10/15/2021   Procedure: TRANSANAL HEMORRHOIDAL DEARTERIALIZATION;  Surgeon: Joyce Nixon, MD;  Location: [facility];  Service: General;  Laterality: N/A;    Family History  Problem Relation Age of Onset   Hyperlipidemia Mother    Heart disease Mother        CABG age 31   Ovarian cancer Mother    Uterine cancer Mother    Arthritis Mother    Osteoporosis Mother    Emphysema Mother    Heart disease Father        CABG age 39   Arthritis Father    Melanoma Father  Allergies  Allergen Reactions   Sulfamethoxazole Rash   Venofer  [Iron  Sucrose] Other (See Comments)    Chest pain, tingling around mouth, lightheaded   Nsaids Other (See Comments)    Drops hemoglobin levels   Sulfa Antibiotics Rash   Yellow Jacket Venom     Other reaction(s): decreased blood pressure, dizziness    Current Outpatient Medications on File Prior to Visit  Medication Sig Dispense Refill   amLODipine  (NORVASC ) 5 MG tablet Take 1 tablet (5 mg total) by mouth daily. 90 tablet 3   Calcium Citrate-Vitamin D  (CITRACAL + D PO) Take 2 tablets by mouth in the morning and at bedtime. Take 2 tablets po a.m and at lunch. 800 mg calcium     cyanocobalamin  (VITAMIN B12) 1000 MCG tablet Take 1,000 mcg by mouth daily.     DARBEPOETIN ALFA  IJ Inject as directed as needed.     denosumab  (PROLIA ) 60 MG/ML SOSY injection Inject 60 mg into the skin every 6 (six) months.     docusate sodium  (COLACE) 100 MG capsule Take 200 mg by mouth every morning.     estradiol  (ESTRACE ) 0.1 MG/GM vaginal cream Use 1/2 g vaginally three times per week as needed. 42.5 g 1   Magnesium Glycinate 665 MG CAPS Take 200 mg by mouth daily.     Menaquinone-7 (K2 PO) Take by mouth.     metoprolol  succinate (TOPROL -XL) 25 MG 24 hr tablet TAKE 1/2 TABLET (12.5 MG TOTAL) BY MOUTH AT BEDTIME *SCHEDULE APPOINTMENT FOR FURTHER REFILLS* 45  tablet 3   Wheat Dextrin (BENEFIBER DRINK MIX PO) Take 1 Scoop by mouth daily.     No current facility-administered medications on file prior to visit.    BP 138/60   Pulse 68   Temp 98.4 F (36.9 C) (Oral)   Ht 5\' 6"  (1.676 m)   Wt 162 lb (73.5 kg)   SpO2 96%   BMI 26.15 kg/m       Objective:   Physical Exam Vitals and nursing note reviewed.  Constitutional:      Appearance: Normal appearance.  Cardiovascular:     Rate and Rhythm: Normal rate and regular rhythm.     Pulses: Normal pulses.     Heart sounds: Normal heart sounds.  Musculoskeletal:        General: No tenderness. Normal range of motion.     Right shoulder: Normal.     Right upper arm: Normal.     Right forearm: Normal.     Right wrist: Normal.     Right hand: Normal.       Back:  Skin:    General: Skin is warm and dry.  Neurological:     General: No focal deficit present.     Mental Status: She is alert and oriented to person, place, and time.     Cranial Nerves: Cranial nerves 2-12 are intact.     Sensory: Sensation is intact.     Motor: Motor function is intact. No weakness.  Psychiatric:        Mood and Affect: Mood normal.        Behavior: Behavior normal.        Thought Content: Thought content normal.        Judgment: Judgment normal.        Assessment & Plan:  1. Cervical radiculopathy (Primary) - Will treat for suspected radiculopathy/nerve impingement with steroids and flexeril . She understands that flexeril  can cause sedation.  - Xray of cervical spine  today. Consider MRI in the future if not resolving  - predniSONE  (DELTASONE ) 20 MG tablet; Take 1 tablet (20 mg total) by mouth daily with breakfast.  Dispense: 7 tablet; Refill: 0 - cyclobenzaprine  (FLEXERIL ) 10 MG tablet; Take 1 tablet (10 mg total) by mouth at bedtime.  Dispense: 15 tablet; Refill: 0 - DG Cervical Spine Complete; Future  Alto Atta, NP

## 2023-08-23 ENCOUNTER — Ambulatory Visit: Admitting: Internal Medicine

## 2023-08-23 ENCOUNTER — Encounter: Payer: Self-pay | Admitting: Hematology and Oncology

## 2023-08-23 ENCOUNTER — Telehealth: Payer: Self-pay

## 2023-08-23 NOTE — Telephone Encounter (Signed)
 Called pt to offer lab and MD visit. She accepted appt for lab tomorrow at 0900 and MD visit Tuesday at 1020. Scheduled. MD made aware.

## 2023-08-24 ENCOUNTER — Inpatient Hospital Stay: Attending: Hematology and Oncology

## 2023-08-24 DIAGNOSIS — Z79899 Other long term (current) drug therapy: Secondary | ICD-10-CM | POA: Diagnosis not present

## 2023-08-24 DIAGNOSIS — D509 Iron deficiency anemia, unspecified: Secondary | ICD-10-CM

## 2023-08-24 DIAGNOSIS — D500 Iron deficiency anemia secondary to blood loss (chronic): Secondary | ICD-10-CM | POA: Insufficient documentation

## 2023-08-24 DIAGNOSIS — D539 Nutritional anemia, unspecified: Secondary | ICD-10-CM

## 2023-08-24 DIAGNOSIS — K922 Gastrointestinal hemorrhage, unspecified: Secondary | ICD-10-CM | POA: Insufficient documentation

## 2023-08-24 LAB — CBC WITH DIFFERENTIAL/PLATELET
Abs Immature Granulocytes: 0.02 10*3/uL (ref 0.00–0.07)
Basophils Absolute: 0 10*3/uL (ref 0.0–0.1)
Basophils Relative: 0 %
Eosinophils Absolute: 0.1 10*3/uL (ref 0.0–0.5)
Eosinophils Relative: 2 %
HCT: 27.9 % — ABNORMAL LOW (ref 36.0–46.0)
Hemoglobin: 8.5 g/dL — ABNORMAL LOW (ref 12.0–15.0)
Immature Granulocytes: 0 %
Lymphocytes Relative: 24 %
Lymphs Abs: 1.4 10*3/uL (ref 0.7–4.0)
MCH: 25.5 pg — ABNORMAL LOW (ref 26.0–34.0)
MCHC: 30.5 g/dL (ref 30.0–36.0)
MCV: 83.8 fL (ref 80.0–100.0)
Monocytes Absolute: 0.4 10*3/uL (ref 0.1–1.0)
Monocytes Relative: 8 %
Neutro Abs: 3.7 10*3/uL (ref 1.7–7.7)
Neutrophils Relative %: 66 %
Platelets: 241 10*3/uL (ref 150–400)
RBC: 3.33 MIL/uL — ABNORMAL LOW (ref 3.87–5.11)
RDW: 17.6 % — ABNORMAL HIGH (ref 11.5–15.5)
WBC: 5.7 10*3/uL (ref 4.0–10.5)
nRBC: 0.5 % — ABNORMAL HIGH (ref 0.0–0.2)

## 2023-08-24 LAB — FERRITIN: Ferritin: 121 ng/mL (ref 11–307)

## 2023-08-24 LAB — IRON AND IRON BINDING CAPACITY (CC-WL,HP ONLY)
Iron: 17 ug/dL — ABNORMAL LOW (ref 28–170)
Saturation Ratios: 5 % — ABNORMAL LOW (ref 10.4–31.8)
TIBC: 360 ug/dL (ref 250–450)
UIBC: 343 ug/dL (ref 148–442)

## 2023-08-24 LAB — VITAMIN B12: Vitamin B-12: 1229 pg/mL — ABNORMAL HIGH (ref 180–914)

## 2023-08-29 ENCOUNTER — Inpatient Hospital Stay (HOSPITAL_BASED_OUTPATIENT_CLINIC_OR_DEPARTMENT_OTHER): Admitting: Hematology and Oncology

## 2023-08-29 ENCOUNTER — Encounter: Payer: Self-pay | Admitting: Hematology and Oncology

## 2023-08-29 VITALS — BP 124/57 | HR 90 | Temp 97.4°F | Resp 18 | Ht 66.0 in | Wt 156.8 lb

## 2023-08-29 DIAGNOSIS — D649 Anemia, unspecified: Secondary | ICD-10-CM

## 2023-08-29 DIAGNOSIS — D500 Iron deficiency anemia secondary to blood loss (chronic): Secondary | ICD-10-CM | POA: Diagnosis not present

## 2023-08-29 NOTE — Progress Notes (Signed)
  [facility] OFFICE PROGRESS NOTE  Harper, Belinda Muir, MD  ASSESSMENT & PLAN:  Assessment & Plan Symptomatic anemia She has multifactorial anemia She has chronic intermittent GI bleed She is somewhat symptomatic with fatigue from anemia There is no role for darbepoetin injection I recommend intravenous iron  infusion with Feraheme  The patient has declined infusion at [facility]. I will schedule 2 doses and then see her back for further review  The most likely cause of her anemia is due to chronic blood loss/malabsorption syndrome. We discussed some of the risks, benefits, and alternatives of intravenous iron  infusions. The patient is symptomatic from anemia and the iron  level is critically low. She tolerated oral iron  supplement poorly and desires to achieved higher levels of iron  faster for adequate hematopoesis. Some of the side-effects to be expected including risks of infusion reactions, phlebitis, headaches, nausea and fatigue.  The patient is willing to proceed. Patient education material was dispensed.  Goal is to keep ferritin level greater than 50 and resolution of anemia   No orders of the defined types were placed in this encounter.   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 Summary of her history is as follows: This is a pleasant woman with multifactorial anemia and associated leukopenia. She has a clear element of iron  malabsorption with a superimposed normochromic anemia and chronic  leukopenia. The chronic anemia and leukopenia go back as far as we have records (2007) when white counts ran as low as 2700. She has a normal differential. White count average is 3000 and has not changed. Platelet count is normal. Best hemoglobin she achieves with parenteral iron  replacement is 11 g. She had a nondiagnostic bone marrow biopsy done in 2006. She had received intravenous iron  infusion in the past. Her last colonoscopy in May 2013 showed severe diverticulosis with internal hemorrhoids. She feels well. Denies recent infection. She denies symptoms of fatigue. She tolerated iron  supplement well. She had periodic hemorrhoidal bleeding. She was found to have abnormal CBC from intermittently over the years by her primary care doctor She has started taking oral iron  supplements since November of last year Most recently, she have noted passage of dark stool She attempted to increase her oral iron  supplement but still complain of excessive fatigue Recently, her repeat CBC has dropped from 11-8.2 and hence she was referred back here for further evaluation On 12/15/20, she had repeat EGD and colonoscopy which showed: - Severe diverticulosis in the sigmoid colon, in the descending colon, in the transverse colon and in the ascending colon. - Non-bleeding external and internal hemorrhoids. - No specimens collected.  She denies recent chest pain on exertion, shortness of breath on minimal exertion, pre-syncopal episodes, or palpitations.  The patient denies over the counter NSAID ingestion. She is not on antiplatelets agents. Her last colonoscopy was in 2013 She had no prior history or diagnosis of cancer. Her age appropriate screening programs are up-to-date. She denies any pica and eats a variety of diet.  The patient was prescribed oral iron  supplements and she takes 1  dose of oral iron  supplement at night to avoid constipation From Sept 2021 till present, she has received many doses of  intravenous iron  infusion On December 15, 2020, she had repeat upper endoscopy evaluation due to severe anemia which showed LA Grade B reflux esophagitis with no bleeding. 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 - Gastritis. Biopsied. - Normal examined duodenum.  Since Monoferric  IV iron  in October 2022, her iron  deficiency resolved On May 28, 2021, she started on Aranesp  injection  On 06/18/21, she had repeat GI procedure PROCEDURE NOTE: The patient presents with symptomatic grade II-III  hemorrhoids, requesting rubber band ligation of his/her hemorrhoidal disease.  All risks, benefits and alternative forms of therapy were described and informed consent was obtained.   In the Left Lateral Decubitus position anoscopic examination revealed grade II-III hemorrhoids in the left lateral and right posterior position(s).  The anorectum was pre-medicated with 0.125% Nitroglycerine and Recticare The decision was made to band the left lateral internal hemorrhoid, and the [facility] O'Regan System was used to perform band ligation without complication.  Digital anorectal examination was then performed to assure proper positioning of the band, and to adjust the banded tissue as required. The patient was discharged home without pain or other issues.  Dietary and behavioral recommendations were given and along with follow-up instructions.    In March 2023, she received both blood transfusion and intravenous iron  infusion On October 15, 2021, she underwent ligation and resection of hemorrhoid On 11/09/2011, she received another unit of blood due to severe rectal bleeding On 11/29/2021, she received 1 dose of darbepoetin injection In February, June 2024 and from September to November 2024, she received intravenous iron  sucrose   Vitals:   08/29/23 1023  BP: (!) 124/57  Pulse: 90  Resp: 18  Temp: (!) 97.4 F (36.3 C)  SpO2: 98%

## 2023-08-29 NOTE — Assessment & Plan Note (Addendum)
 She has multifactorial anemia She has chronic intermittent GI bleed She is somewhat symptomatic with fatigue from anemia There is no role for darbepoetin injection I recommend intravenous iron  infusion with Feraheme  The patient has declined infusion at [facility]. I will schedule 2 doses and then see her back for further review

## 2023-09-02 ENCOUNTER — Inpatient Hospital Stay

## 2023-09-02 VITALS — BP 138/57 | HR 69 | Temp 97.6°F | Resp 18

## 2023-09-02 DIAGNOSIS — D500 Iron deficiency anemia secondary to blood loss (chronic): Secondary | ICD-10-CM

## 2023-09-02 MED ORDER — FERUMOXYTOL INJECTION 510 MG/17 ML
510.0000 mg | Freq: Once | INTRAVENOUS | Status: AC
  Administered 2023-09-02: 510 mg via INTRAVENOUS
  Filled 2023-09-02: qty 510

## 2023-09-08 MED FILL — Ferumoxytol Inj 510 MG/17ML (30 MG/ML) (Elemental Fe): INTRAVENOUS | Qty: 17 | Status: AC

## 2023-09-09 ENCOUNTER — Inpatient Hospital Stay

## 2023-09-09 VITALS — BP 136/62 | HR 77 | Temp 98.0°F | Resp 17

## 2023-09-09 DIAGNOSIS — D500 Iron deficiency anemia secondary to blood loss (chronic): Secondary | ICD-10-CM

## 2023-09-09 MED ORDER — SODIUM CHLORIDE 0.9 % IV SOLN
510.0000 mg | Freq: Once | INTRAVENOUS | Status: AC
  Administered 2023-09-09: 510 mg via INTRAVENOUS
  Filled 2023-09-09: qty 510

## 2023-09-09 MED ORDER — SODIUM CHLORIDE 0.9 % IV SOLN: INTRAVENOUS | Status: DC

## 2023-09-14 ENCOUNTER — Other Ambulatory Visit: Payer: Medicare Other

## 2023-09-19 ENCOUNTER — Telehealth: Payer: Self-pay

## 2023-09-19 NOTE — Telephone Encounter (Signed)
 Pt ready for scheduling after 10/19/23  Estimated out-of-pocket cost due at time of visit: $0  Primary Insurance: [facility] Medicare [employer]  co-insurance: 0%  Deductible: $200 out of $200  Buy/Bill  Prior Auth: Approved PA#: [id number] Valid: 09/19/23-09/18/24  This summary of benefits is an estimation of the patient's out-of-pocket cost. Exact cost may vary based on individual plan coverage.

## 2023-09-20 NOTE — Telephone Encounter (Signed)
 Attempted to reach pt. Left a voicemail to call us back.

## 2023-09-20 NOTE — Telephone Encounter (Signed)
 Spoke to pt. Appt change to 10/20/2023.   Pt request to place order for Dexa scan to [facility]. Inform pt we will placed it in and that her Dexa scan is due in September. Pt verbalized understanding.

## 2023-09-21 ENCOUNTER — Ambulatory Visit: Payer: Medicare Other | Admitting: Hematology and Oncology

## 2023-09-21 NOTE — Telephone Encounter (Signed)
 Form faxed to [facility]. Received confirmation.

## 2023-09-28 DIAGNOSIS — N3946 Mixed incontinence: Secondary | ICD-10-CM | POA: Insufficient documentation

## 2023-09-28 NOTE — Progress Notes (Unsigned)
 New Patient Evaluation and Consultation  Referring Provider: Emmette Harms* PCP: Reginal Capra, MD Date of Service: 09/29/2023  SUBJECTIVE Chief Complaint: No chief complaint on file.  History of Present Illness: Belinda Harper is a 83 y.o. {ED SANE [phone] female seen in consultation at the request of Dr Jorie Newness for evaluation of vaginal ulceration due to pessary use for stage IV pelvic organ prolapse.    Known uterine prolapse for 7-8 years, previously managed with #3 ring with support pessary and vaginal estrogen 3x/week Splints to void since pessary removed due to bilateral vaginal lacerations noted 10/19/22 with #4 ring with support pessary. 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 The uterus is measured at 4.19 cm x 3.58 cm x 1.94 cm. The endometrial lining is thin and symmetrical measured at 2.3 mm in the right horn and at 3.1 mm in the left horn. No mass or thickening or abnormal blood flow is seen. Both ovaries are identified with a small atrophic appearance. No adnexal mass. No free fluid in the pelvis. Note: Postvoid bladder volume was 148 mL."  Urinary Symptoms: Leaks urine with going from sitting to standing, with a full bladder, with urgency, while asleep, and unclear urine vs. Mucous vaginal discharge on pad Leakage during the day and overnight, reduces when she is sitting or in bed Leaks ***  time(s) per {days/wks/mos/yrs:310907}.  Pad use: 3-5 liners/ mini-pads per day.   Patient is bothered by UI symptoms.  Day time voids 4-5.  Nocturia: 1-2 times per night to void. Voiding dysfunction:  does not empty bladder well.  Patient does not use a catheter to empty bladder.  When urinating, patient feels difficulty starting urine stream, dribbling after finishing, and the need to urinate multiple times in a row Drinks: ***oz water per day  UTIs: 0 UTI's in the last year.   {ACTIONS;DENIES/REPORTS:21021675::"Denies"} history of blood in urine, kidney or bladder stones, pyelonephritis, bladder cancer, and kidney cancer No results found for the last 90 days.   Pelvic Organ Prolapse Symptoms:                  Patient Admits to a feeling of a bulge the vaginal area. It has been present for 6-7 years.  Patient Admits to seeing a bulge.  This bulge is bothersome.  Bowel Symptom: Bowel movements: 1 time(s) per day with history of diverticulosis and prolapsing hemorrhoids s/p transanal hemorrhoidal dearterialization by Dr. Andy Bannister in 10/15/2021 and banding by Dr. Leonia Raman in 05/07/21 Stool consistency: soft  Straining: yes.  Splinting: yes.  Incomplete evacuation: yes.  Patient Admits to accidental bowel leakage / fecal incontinence  Occurs: 2 time(s) per month  Consistency with leakage: soft  Bowel regimen: diet and stool softener Last colonoscopy: Date 12/15/20 ***, Results ***showed diverticulosis, non-bleeding external and internal hemorrhoids  HM Colonoscopy   This patient has no relevant Health Maintenance data.     Sexual Function Sexually active: no.  Sexual orientation: {Sexual Orientation:[phone]} Pain with sex: No  Pelvic Pain Denies pelvic pain   Past Medical History:  Past Medical History:  Diagnosis Date   Allergic rhinitis    Anxiety    Bradycardia    Chronic constipation    Chronic gastritis    followed by dr Leonia Raman (GI);  long hx erosive gastritis  with bleed   Depression    Family history of adverse reaction to anesthesia    pt's mother as she gets older organs shut down    GERD (gastroesophageal reflux disease)    Heart palpitations    cardiologist-- dr Maximo Spar;   palpitations w/ intermittant bradycardia;  event monitor 04-01-2020 multiple SVT episodes   Hemorrhoids    s/p banding 05-07-2021 by dr Leonia Raman   History of asthma    childhood   History of sepsis 02/05/2015   sepsis secondart to cellulitis , left leg/ ankle due to retained ankle hardware   Hyperlipidemia    Hypertension    followed by pcp and cardiology;   normal nuclear stress test 09-28-2012   Iron  deficiency anemia secondary to blood loss (chronic)    oncologist--- dr Marton Sleeper;  long hx anemia secondary gi blood loss;  treated with transfusion's and IV iron    Leukopenia    chronic intermittantly --- followed by dr Marton Sleeper   OA (osteoarthritis)    Osteoporosis    Presence of pessary    PSVT (paroxysmal supraventricular tachycardia) (HCC)    Rectal bleeding    Uterine prolapse    per pt uses pessary   Wears glasses      Past Surgical History:   Past Surgical History:  Procedure Laterality Date   CATARACT EXTRACTION W/ INTRAOCULAR LENS IMPLANT Bilateral    right 2000;  left 2020   COLONOSCOPY WITH ESOPHAGOGASTRODUODENOSCOPY (EGD)  12/15/2020   by dr Leonia Raman   HARDWARE REMOVAL Left 02/21/2015   Procedure: IRRIGATION AND DEBRIDEMENT HARDWARE REMOVAL LEFT ANKLE ;  Surgeon: Liliane Rei, MD;  Location: [facility];  Service: Orthopedics;  Laterality: Left;   MASS EXCISION Left 09/11/2012   Procedure: 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; Rotation Flap;  Surgeon: Kemp Patter, MD;  Location: [facility];  Service: Orthopedics;  Laterality: Left;   ORIF ANKLE FRACTURE Left 03/21/2009   @[facility]   by dr France Ina;   fracture and dislocation   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 , saw injury   TRANSANAL HEMORRHOIDAL  DEARTERIALIZATION N/A 10/15/2021   Procedure: TRANSANAL HEMORRHOIDAL DEARTERIALIZATION;  Surgeon: Joyce Nixon, MD;  Location: [facility] [place];  Service: General;  Laterality: N/A;     Past OB/GYN History: OB History  Gravida Para Term Preterm AB Living  3 2 2  1 2   SAB IAB Ectopic Multiple Live Births  1        # Outcome Date GA Lbr Len/2nd Weight Sex Type Anes PTL Lv  3 SAB           2 Term           1 Term             Vaginal deliveries: ***,  Forceps/ Vacuum deliveries: ***, Cesarean section: *** Menopausal: Yes, at age 77, Admits to vaginal bleeding since menopause Contraception: BTL in 38. Last pap smear.  Any history of abnormal pap smears: no.    Component Value Date/Time   DIAGPAP (A) 06/12/2023 1635    - Atypical squamous cells of undetermined significance (ASC-US )   DIAGPAP - Non-diagnostic (A) 05/17/2023  1453   HPVHIGH Negative 06/12/2023 1635   HPVHIGH Other 05/17/2023 1453   ADEQPAP  06/12/2023 1635    Satisfactory for evaluation; transformation zone component PRESENT.   ADEQPAP  05/17/2023 1453    UNSATISFACTORY for evaluation due to extremely scant cellularity. The   [facility]  05/17/2023 1453    specimen is processed and examined microscopically, but is found to be   ADEQPAP  05/17/2023 1453    unsatisfactory for evaluation of an epithelial abnormality. Repeat study   ADEQPAP recommended. 05/17/2023 1453    Medications: Patient has a current medication list which includes the following prescription(s): amlodipine , calcium citrate-vitamin d , cyanocobalamin , darbepoetin alfa , denosumab , docusate sodium , magnesium glycinate, menaquinone-7, metoprolol  succinate, and wheat dextrin.   Allergies: Patient is allergic to sulfamethoxazole, venofer  [iron  sucrose], nsaids, sulfa antibiotics, and yellow jacket venom.   Social History:  Social History   Tobacco Use   Smoking status: Never    Passive exposure: Past   Smokeless tobacco: Never  Vaping  Use   Vaping status: Never Used  Substance Use Topics   Alcohol use: Not Currently   Drug use: No    Relationship status: divorced Patient lives with ***.   Patient is not employed. Regular exercise: Yes: *** History of abuse: No  Family History:   Family History  Problem Relation Age of Onset   Hyperlipidemia Mother    Heart disease Mother        CABG age 64   Ovarian cancer Mother    Uterine cancer Mother    Arthritis Mother    Osteoporosis Mother    Emphysema Mother    Heart disease Father        CABG age 70   Arthritis Father    Melanoma Father      Review of Systems: Review of Systems  Constitutional:  Negative for fever, malaise/fatigue and weight loss.  Respiratory:  Positive for wheezing. Negative for cough and shortness of breath.   Cardiovascular:  Positive for leg swelling. Negative for chest pain and palpitations.  Gastrointestinal:  Positive for constipation. Negative for abdominal pain and blood in stool.  Genitourinary:  Positive for frequency (night time) and urgency. Negative for dysuria and hematuria.       Vaginal discharge, leakage, bulge  Skin:  Negative for rash.  Neurological:  Positive for weakness. Negative for dizziness and headaches.  Endo/Heme/Allergies:  Does not bruise/bleed easily.  Psychiatric/Behavioral:  Negative for depression. The patient is not nervous/anxious.      OBJECTIVE Physical Exam: There were no vitals filed for this visit.  Physical Exam Constitutional:      General: She is not in acute distress.    Appearance: Normal appearance.  Genitourinary:     Bladder and urethral meatus normal.     No lesions in the vagina.     Right Labia: No rash, tenderness, lesions, skin changes or Bartholin's cyst.    Left Labia: No tenderness, lesions, skin changes, Bartholin's cyst or rash.    No vaginal discharge, erythema, tenderness, bleeding, ulceration or granulation tissue.      Right Adnexa: not tender, not full and no mass  present.    Left Adnexa: not tender, not full and no mass present.    No cervical motion tenderness, discharge, friability, lesion, polyp or nabothian cyst.     Uterus is not enlarged, fixed, tender or irregular.     No uterine mass detected.    Urethral meatus caruncle not present.    No urethral  prolapse, tenderness, mass, hypermobility or discharge present.     Bladder is not tender, urgency on palpation not present and masses not present.      Levator ani not tender, obturator internus not tender, no asymmetrical contractions present and no pelvic spasms present.    Anal wink present and BC reflex present. Cardiovascular:     Rate and Rhythm: Normal rate.  Pulmonary:     Effort: Pulmonary effort is normal. No respiratory distress.  Abdominal:     General: There is no distension.     Palpations: There is no mass.     Tenderness: There is no abdominal tenderness.     Hernia: No hernia is present.  Neurological:     Mental Status: She is alert.  Vitals reviewed. Exam conducted with a chaperone present.      POP-Q:   POP-Q                                               Aa                                               Ba                                                 C                                                Gh                                               Pb                                               tvl                                                Ap                                               Bp                                                 D      Rectal Exam:  Normal sphincter tone, {rectocele:24766} distal rectocele, enterocoele {DESC; PRESENT/NOT PRESENT:21021351}, no rectal masses, {sign of:24767}  dyssynergia when asking the patient to bear down.  Post-Void Residual (PVR) by Bladder Scan: In order to evaluate bladder emptying, we discussed obtaining a postvoid residual and patient agreed to this procedure.  Procedure: The ultrasound  unit was placed on the patient's abdomen in the suprapubic region after the patient had voided.      Laboratory Results: Lab Results  Component Value Date   COLORU yellow 08/10/2016   CLARITYU clear 08/10/2016   GLUCOSEUR n 08/10/2016   BILIRUBINUR n 08/10/2016   KETONESU n 08/10/2016   SPECGRAV 1.010 08/10/2016   RBCUR n 08/10/2016   PHUR 7.0 08/10/2016   PROTEINUR 1+ (A) 02/12/2018   UROBILINOGEN 0.2 08/10/2016   LEUKOCYTESUR Negative 08/10/2016    Lab Results  Component Value Date   CREATININE 0.89 03/15/2023   CREATININE 0.92 11/18/2021   CREATININE 0.80 10/15/2021    Lab Results  Component Value Date   HGBA1C 5.4 03/15/2023    Lab Results  Component Value Date   HGB 8.5 (L) 08/24/2023     ASSESSMENT AND PLAN Ms. Gural is a 83 y.o. with: No diagnosis found.  There are no diagnoses linked to this encounter.   Darlene Ehlers, MD

## 2023-09-29 ENCOUNTER — Ambulatory Visit (INDEPENDENT_AMBULATORY_CARE_PROVIDER_SITE_OTHER): Payer: Medicare Other | Admitting: Obstetrics

## 2023-09-29 ENCOUNTER — Encounter: Payer: Self-pay | Admitting: Obstetrics

## 2023-09-29 ENCOUNTER — Other Ambulatory Visit (HOSPITAL_COMMUNITY)
Admission: RE | Admit: 2023-09-29 | Discharge: 2023-09-29 | Disposition: A | Discharge status: Home / Self Care | Attending: Obstetrics | Admitting: Obstetrics

## 2023-09-29 VITALS — BP 152/81 | HR 78 | Ht 65.35 in | Wt 154.4 lb

## 2023-09-29 DIAGNOSIS — R8290 Unspecified abnormal findings in urine: Secondary | ICD-10-CM | POA: Diagnosis not present

## 2023-09-29 DIAGNOSIS — N950 Postmenopausal bleeding: Secondary | ICD-10-CM | POA: Diagnosis not present

## 2023-09-29 DIAGNOSIS — N3946 Mixed incontinence: Secondary | ICD-10-CM

## 2023-09-29 DIAGNOSIS — N939 Abnormal uterine and vaginal bleeding, unspecified: Secondary | ICD-10-CM

## 2023-09-29 DIAGNOSIS — R351 Nocturia: Secondary | ICD-10-CM

## 2023-09-29 DIAGNOSIS — N368 Other specified disorders of urethra: Secondary | ICD-10-CM

## 2023-09-29 DIAGNOSIS — N814 Uterovaginal prolapse, unspecified: Secondary | ICD-10-CM

## 2023-09-29 LAB — URINALYSIS, ROUTINE W REFLEX MICROSCOPIC
Bacteria, UA: NONE SEEN
Bilirubin Urine: NEGATIVE
Glucose, UA: NEGATIVE mg/dL
Ketones, ur: NEGATIVE mg/dL
Leukocytes,Ua: NEGATIVE
Nitrite: NEGATIVE
Protein, ur: NEGATIVE mg/dL
Specific Gravity, Urine: 1.016 (ref 1.005–1.030)
pH: 6 (ref 5.0–8.0)

## 2023-09-29 LAB — POCT URINALYSIS DIP (CLINITEK)
Bilirubin, UA: NEGATIVE
Glucose, UA: NEGATIVE mg/dL
Ketones, POC UA: NEGATIVE mg/dL
Nitrite, UA: NEGATIVE
POC PROTEIN,UA: NEGATIVE
Spec Grav, UA: 1.015 (ref 1.010–1.025)
Urobilinogen, UA: 0.2 U/dL
pH, UA: 6 (ref 5.0–8.0)

## 2023-09-29 MED ORDER — PHENAZOPYRIDINE HCL 200 MG PO TABS: 200.0000 mg | ORAL_TABLET | Freq: Three times a day (TID) | ORAL | 0 refills | Status: DC | PRN

## 2023-09-29 NOTE — Patient Instructions (Addendum)
 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  We discussed the fact that it is not life threatening but there are several treatment options. For treatment of pelvic organ prolapse, we discussed options for management including expectant management, conservative management, and surgical management, such as Kegels, a pessary, pelvic floor physical therapy, and specific surgical procedures.     For pessary use: - discussed risk of change in urinary or bowel symptoms, vaginal ulceration, discharge, bleeding, fistula formation. You may require multiple sizes and types for fitting.   We discussed two options for prolapse repair:  1) vaginal repair without mesh - Pros - safer, no mesh complications - Cons - not as strong as mesh repair, higher risk of recurrence  2) Vaginal closure procedure - pros - strong, good long-term success - cons - unable to have penetrative intercourse  We discussed the symptoms of overactive bladder (OAB), which include urinary urgency, urinary frequency, night-time urination, with or without urge incontinence.  We discussed management including behavioral therapy (decreasing bladder irritants by following a bladder diet, urge suppression strategies, timed voids, bladder retraining), physical therapy, medication; and for refractory cases posterior tibial nerve stimulation, sacral neuromodulation, and intravesical botulinum toxin injection.   For vaginal atrophy (thinning of the vaginal tissue that can cause dryness and burning) and UTI prevention we discussed estrogen replacement in the form of vaginal cream.   Start vaginal estrogen therapy nightly for two weeks then 2 times weekly at night. This can be placed with your finger or an applicator inside the vagina and around the urethra.  Please let us  know if the prescription is too expensive and we can look for alternative options.   Is vaginal estrogen therapy safe for me? Vaginal estrogen preparations act on the vaginal skin,  and only a very tiny amount is absorbed into the bloodstream (0.01%).  They work in a similar way to hand or face cream.  There is minimal absorption and they are therefore perfectly safe. If you have had breast cancer and have persistent troublesome symptoms which aren't settling with vaginal moisturisers and lubricants, local estrogen treatment may be a possibility, but consultation with your oncologist should take place first.   Constipation: Our goal is to achieve formed bowel movements daily or every-other-day.  You may need to try different combinations of the following options to find what works best for you - everybody's body works differently so feel free to adjust the dosages as needed.  Some options to help maintain bowel health include:  Dietary changes (more leafy greens, vegetables and fruits; less processed foods) Fiber supplementation (Benefiber, FiberCon, Metamucil or Psyllium). Start slow and increase gradually to full dose. Over-the-counter agents such as: stool softeners (Docusate or Colace) and/or laxatives (Miralax , milk of magnesia)  "Power Pudding" is a natural mixture that may help your constipation.  To make blend 1 cup applesauce, 1 cup wheat bran, and 3/4 cup prune juice, refrigerate and then take 1 tablespoon daily with a large glass of water as needed.   Women should try to eat at least 21 to 25 grams of fiber a day, while men should aim for 30 to 38 grams a day. You can add fiber to your diet with food or a fiber supplement such as psyllium (metamucil), benefiber, or fibercon.   Here's a look at how much dietary fiber is found in some common foods. When buying packaged foods, check the Nutrition Facts label for fiber content. It can vary among brands.  Fruits Serving  size Total fiber (grams)*  Raspberries 1 cup 8.0  Pear 1 medium 5.5  Apple, with skin 1 medium 4.5  Banana 1 medium 3.0  Orange 1 medium 3.0  Strawberries 1 cup 3.0   Vegetables Serving size Total  fiber (grams)*  Green peas, boiled 1 cup 9.0  Broccoli, boiled 1 cup chopped 5.0  Turnip greens, boiled 1 cup 5.0  Brussels sprouts, boiled 1 cup 4.0  Potato, with skin, baked 1 medium 4.0  Sweet corn, boiled 1 cup 3.5  Cauliflower, raw 1 cup chopped 2.0  Carrot, raw 1 medium 1.5   Grains Serving size Total fiber (grams)*  Spaghetti, whole-wheat, cooked 1 cup 6.0  Barley, pearled, cooked 1 cup 6.0  Bran flakes 3/4 cup 5.5  Quinoa, cooked 1 cup 5.0  Oat bran muffin 1 medium 5.0  Oatmeal, instant, cooked 1 cup 5.0  Popcorn, air-popped 3 cups 3.5  Brown rice, cooked 1 cup 3.5  Bread, whole-wheat 1 slice 2.0  Bread, rye 1 slice 2.0   Legumes, nuts and seeds Serving size Total fiber (grams)*  Split peas, boiled 1 cup 16.0  Lentils, boiled 1 cup 15.5  Black beans, boiled 1 cup 15.0  Baked beans, canned 1 cup 10.0  Chia seeds 1 ounce 10.0  Almonds 1 ounce (23 nuts) 3.5  Pistachios 1 ounce (49 nuts) 3.0  Sunflower kernels 1 ounce 3.0  *Rounded to nearest 0.5 gram. Source: [employer] for Standard Reference, Legacy Release    Please return to Dr. Finas Huger for assess of postmenopausal bleeding.

## 2023-09-29 NOTE — Assessment & Plan Note (Signed)
-   avoid fluid intake 3 hours before bedtime - elevated feet during the day or use compression socks to reduce lower extremity swelling

## 2023-09-29 NOTE — Assessment & Plan Note (Addendum)
-   prior use of size 6 ring with support pessary - prefers size 5 ring with support pessary. Encouraged to replace after resuming vaginal estrogen - For treatment of pelvic organ prolapse, we discussed options for management including expectant management, conservative management, and surgical management, such as Kegels, a pessary, pelvic floor physical therapy, and specific surgical procedures. - We discussed two options for prolapse repair:  1) vaginal repair without mesh - Pros - safer, no mesh complications - Cons - not as strong as mesh repair, higher risk of recurrence  2) Vaginal closure procedure - pros - strong, good long-term success - cons - unable to have penetrative intercourse - patient reports interest in uterine preservation, reviewed risks and benefits of uterine preservation and discussed risk in the setting of postmenopausal bleeding. - discussed need to optimize preop Hgb level due to chronic anemia and prior postop transfusion - discussed risk of change in urinary or bowel symptoms, vaginal ulceration, discharge, bleeding, fistula formation. Explained that pt may require multiple sizes and types for fitting.  Pt to return for pessary check and possible refitting if needed. - reviewed pessary assistant and consider topical lidocaine  to improve comfort at the time of pessary removal to support self mgmt. - poor surgical candidate

## 2023-09-29 NOTE — Assessment & Plan Note (Signed)
-   reviewed findings  - denies UTI symptoms, gross hematuria with normal PVR - pending additional urine testing to r/o microscopic hematuria - encouraged to resume vaginal estrogen

## 2023-09-29 NOTE — Assessment & Plan Note (Signed)
-   discussed need for additional workup due to postmenopausal bleeding in the absence of vaginal ulcers - last ultrasound in 2023 with thin endometrial lining - discussed need for endometrial biopsy if persistent bleeding despite normal ultrasound - encouraged pt to follow-up with Dr. Colvin Dec

## 2023-09-29 NOTE — Assessment & Plan Note (Signed)
-   denies bother due to minimal symptoms and unclear vaginal discharge vs. Urinary leakage - Rx pyridium for pad test - We discussed the symptoms of overactive bladder (OAB), which include urinary urgency, urinary frequency, nocturia, with or without urge incontinence.  While we do not know the exact etiology of OAB, several treatment options exist. We discussed management including behavioral therapy (decreasing bladder irritants, urge suppression strategies, timed voids, bladder retraining), physical therapy, medication; for refractory cases posterior tibial nerve stimulation, sacral neuromodulation, and intravesical botulinum toxin injection.  For anticholinergic medications, we discussed the potential side effects of anticholinergics including dry eyes, dry mouth, constipation, cognitive impairment and urinary retention. For Beta-3 agonist medication, we discussed the potential side effect of elevated blood pressure which is more likely to occur in individuals with uncontrolled hypertension. - encouraged pt to resume vaginal estrogen and splint to ensure bladder emptying

## 2023-09-29 NOTE — Assessment & Plan Note (Signed)
-   POCT UA + heme - pending catheterized testing with [facility]

## 2023-10-05 ENCOUNTER — Inpatient Hospital Stay

## 2023-10-11 ENCOUNTER — Encounter: Payer: Self-pay | Admitting: Primary Care

## 2023-10-11 ENCOUNTER — Ambulatory Visit (INDEPENDENT_AMBULATORY_CARE_PROVIDER_SITE_OTHER): Admitting: Primary Care

## 2023-10-11 ENCOUNTER — Ambulatory Visit: Admitting: Internal Medicine

## 2023-10-11 VITALS — BP 136/70 | HR 65 | Temp 98.9°F | Ht 65.0 in | Wt 154.0 lb

## 2023-10-11 DIAGNOSIS — Z87898 Personal history of other specified conditions: Secondary | ICD-10-CM | POA: Diagnosis not present

## 2023-10-11 DIAGNOSIS — J984 Other disorders of lung: Secondary | ICD-10-CM | POA: Diagnosis not present

## 2023-10-11 DIAGNOSIS — J302 Other seasonal allergic rhinitis: Secondary | ICD-10-CM

## 2023-10-11 DIAGNOSIS — R9389 Abnormal findings on diagnostic imaging of other specified body structures: Secondary | ICD-10-CM

## 2023-10-11 DIAGNOSIS — J849 Interstitial pulmonary disease, unspecified: Secondary | ICD-10-CM

## 2023-10-11 DIAGNOSIS — J3081 Allergic rhinitis due to animal (cat) (dog) hair and dander: Secondary | ICD-10-CM

## 2023-10-11 DIAGNOSIS — R0989 Other specified symptoms and signs involving the circulatory and respiratory systems: Secondary | ICD-10-CM

## 2023-10-11 DIAGNOSIS — J4520 Mild intermittent asthma, uncomplicated: Secondary | ICD-10-CM

## 2023-10-11 LAB — PULMONARY FUNCTION TEST
DL/VA % pred: 100 %
DL/VA: 3.98 ml/min/mmHg/L
DLCO cor % pred: 67 %
DLCO cor: 14 ml/min/mmHg
DLCO unc % pred: 67 %
DLCO unc: 14 ml/min/mmHg
FEF 25-75 Post: 1.59 L/s
FEF 25-75 Pre: 0.94 L/s
FEF2575-%Change-Post: 68 %
FEF2575-%Pred-Post: 103 %
FEF2575-%Pred-Pre: 61 %
FEV1-%Change-Post: 12 %
FEV1-%Pred-Post: 64 %
FEV1-%Pred-Pre: 57 %
FEV1-Post: 1.42 L
FEV1-Pre: 1.26 L
FEV1FVC-%Change-Post: 2 %
FEV1FVC-%Pred-Pre: 102 %
FEV6-%Change-Post: 10 %
FEV6-%Pred-Post: 65 %
FEV6-%Pred-Pre: 59 %
FEV6-Post: 1.84 L
FEV6-Pre: 1.67 L
FEV6FVC-%Change-Post: 0 %
FEV6FVC-%Pred-Post: 105 %
FEV6FVC-%Pred-Pre: 105 %
FVC-%Change-Post: 10 %
FVC-%Pred-Post: 62 %
FVC-%Pred-Pre: 56 %
FVC-Post: 1.85 L
FVC-Pre: 1.67 L
Post FEV1/FVC ratio: 77 %
Post FEV6/FVC ratio: 100 %
Pre FEV1/FVC ratio: 75 %
Pre FEV6/FVC Ratio: 100 %
RV % pred: 90 %
RV: 2.32 L
TLC % pred: 74 %
TLC: 4.08 L

## 2023-10-11 NOTE — Progress Notes (Signed)
 Full PFT performed today.

## 2023-10-11 NOTE — Progress Notes (Signed)
 @Patient  ID: Belinda Harper, female    DOB: 09-Jul-1940, 83 y.o.   MRN: [id number]  Chief Complaint  Patient presents with   Follow-up    PFT f/u    Referring provider: Reginal Capra, MD  HPI: 83 year female, never smoked (second hand exposure). PMH significant for asthma, allergic rhinitis, scoliosis, iron  deficiency anemia, hx asthma.   Previous [facility] pulmonary encounter: 07/11/23- Dr. Mcdonald Speller 52 y.o. -ex-wife of Mr. Brooksie Ellwanger who is my patient with COPD.  Patient herself has chronic scoliosis.  She is a primary patient of Dr. Ethel Henry.  She states that she was a passive smoker.  This is because apparent smoke and then when she was married to Mr. Toryn Dewalt between 4098 and 2003 he smoked although he quit sometime in the 1980s.  She herself does not have any pulmonary issues.  Although as a child when she was 18 she is to have nocturnal wheezing and she had a cat allergy.  She still believes she has cat allergy even though she has a cat.  She also has seasonal allergies but she manages with a cough that happens in the springtime.  Her most recently for the last 3 to 4 months she started noticing new onset nocturnal wheezing.  There is no cough there is no shortness of breath.  She has chronic venous stasis edema for the last few years without any change.  So she saw her primary care doctor.  They did a chest x-ray that I personally visualized and shows hyperinflated lungs and basal atelectasis.  She has chronic scoliosis and does concern for bronchiectasis in the lower base [she has crackles here] therefore she is being referred here.  Other medical issues include -chronic scoliosis - Chronic anemia hemoglobin between 8 and 10 g% - Never had MI.  No A-fib no stroke no cancer no diabetes - Does have history of hypertension - Does have seasonal allergies - Skin test +6 years ago - History of cat allergy positivity.  Not otherwise specified.    10/11/2023- interim  hx Discussed the use of AI scribe software for clinical note transcription with the patient, who gave verbal consent to proceed.  History of Present Illness   Belinda Harper is an 83 year old female with COPD who presents for a pulmonary function test and evaluation of wheezing. She was referred by Dr. Bertrum Brodie for evaluation of her COPD and respiratory symptoms.  She has a history of COPD and was previously evaluated in March. She has no history of smoking but has had frequent exposure to smoke. A recent chest x-ray showed evidence of emphysema and bronchiectasis. HRCT scan revealed very subtle signs of interstitial lung disease, indeterminate for UIP.   She experiences occasional wheezing at night, which she describes as 'very, very minimal' and not occurring every day. She has a history of asthma as a child. She is not currently using an inhaler and prefers not to start one unless absolutely necessary. She is seeking to establish a baseline for her respiratory condition.  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 She has scoliosis, which may contribute to some restriction in lung function. She has been around welding and outdoors frequently, which may have exposed her to environmental irritants.  She has a significant allergy to dust mites and owns an old air purifier but has not used it due to uncertainty about filter replacement. She also has mild  allergies to mold, cat, and dog, and lives in an older house with a history of mold issues. She enjoys gardening but finds wearing a mask difficult due to breathing issues.  She does not currently take any medications for her allergies or respiratory symptoms, preferring to manage them through lifestyle adjustments. Her lab work showed a normal eosinophil count and a low-level allergy to cats. She experiences morning congestion, which she attributes to a past endoscopy.      Pulmonary function testing 10/11/23>>  FVC 1.85 (62%), FEV1 1.42 (64%), ratio 77, DLCOunc 14.00 (67%) Moderate restriction with moderate diffusion defect and positive BD    Allergies  Allergen Reactions   Sulfamethoxazole Rash   Venofer  [Iron  Sucrose] Other (See Comments)    Chest pain, tingling around mouth, lightheaded   Aspirin Other (See Comments)    bleeding   Nsaids Other (See Comments)    Drops hemoglobin levels   Sulfa Antibiotics Rash   Yellow Jacket Venom     Other reaction(s): decreased blood pressure, dizziness    Immunization History  Administered Date(s) Administered   Fluad Quad(high Dose 65+) 03/02/2021, 03/02/2022   Influenza Whole 03/10/2009, 01/27/2010   Influenza, High Dose Seasonal PF 03/23/2016, 04/24/2017, 02/09/2018, 03/19/2019   Influenza,inj,Quad PF,6+ Mos 04/08/2020   Influenza-Unspecified 03/20/2023   PFIZER(Purple Top)SARS-COV-2 Vaccination 02/12/2020, 03/04/2020   Pneumococcal Conjugate-13 12/18/2013   Pneumococcal Polysaccharide-23 04/25/2006, 10/18/2012   Td 04/25/1997, 06/22/2009   Tdap 03/20/2023   Zoster Recombinant(Shingrix) 11/01/2017, 01/04/2018   Zoster, Live 06/28/2010    Past Medical History:  Diagnosis Date   Allergic rhinitis    Anxiety    Bradycardia    Chronic constipation    Chronic gastritis    followed by dr Leonia Raman (GI);  long hx erosive gastritis with bleed   Depression    Family history of adverse reaction to anesthesia    pt's mother as she gets older organs shut down    GERD (gastroesophageal reflux disease)    Heart palpitations    cardiologist-- dr Maximo Spar;   palpitations w/ intermittant bradycardia;  event monitor 04-01-2020 multiple SVT episodes   Hemorrhoids    s/p banding 05-07-2021 by dr Leonia Raman   History of asthma    childhood   History of sepsis 02/05/2015   sepsis secondart to cellulitis , left leg/ ankle due to retained ankle hardware   Hyperlipidemia    Hypertension    followed by pcp and cardiology;   normal nuclear stress test  09-28-2012   Iron  deficiency anemia secondary to blood loss (chronic)    oncologist--- dr Marton Sleeper;  long hx anemia secondary gi blood loss;  treated with transfusion's and IV iron    Leukopenia    chronic intermittantly --- followed by dr Marton Sleeper   OA (osteoarthritis)    Osteoporosis    Presence of pessary    PSVT (paroxysmal supraventricular tachycardia) (HCC)    Rectal bleeding    Uterine prolapse    per pt uses pessary   Wears glasses     Tobacco History: Social History   Tobacco Use  Smoking Status Never   Passive exposure: Past  Smokeless Tobacco Never   Counseling given: Not Answered   Outpatient Medications Prior to Visit  Medication Sig Dispense Refill   amLODipine  (NORVASC ) 5 MG tablet Take 1 tablet (5 mg total) by mouth daily. 90 tablet 3   Calcium Citrate-Vitamin D  (CITRACAL + D PO) Take 2 tablets by mouth in the morning and at bedtime. Take 2 tablets po a.m  and at lunch. 800 mg calcium     cyanocobalamin  (VITAMIN B12) 1000 MCG tablet Take 1,000 mcg by mouth daily. (Patient taking differently: Take 500 mcg by mouth daily.)     DARBEPOETIN ALFA  IJ Inject as directed as needed.     denosumab  (PROLIA ) 60 MG/ML SOSY injection Inject 60 mg into the skin every 6 (six) months.     docusate sodium  (COLACE) 100 MG capsule Take 200 mg by mouth every morning.     Magnesium Glycinate 665 MG CAPS Take 200 mg by mouth daily.     Menaquinone-7 (K2 PO) Take by mouth.     metoprolol  succinate (TOPROL -XL) 25 MG 24 hr tablet TAKE 1/2 TABLET (12.5 MG TOTAL) BY MOUTH AT BEDTIME *SCHEDULE APPOINTMENT FOR FURTHER REFILLS* 45 tablet 3   Wheat Dextrin (BENEFIBER DRINK MIX PO) Take 1 Scoop by mouth daily.     phenazopyridine  (PYRIDIUM ) 200 MG tablet Take 1 tablet (200 mg total) by mouth 3 (three) times daily as needed for pain. 10 tablet 0   No facility-administered medications prior to visit.      Review of Systems  Review of Systems  Constitutional: Negative.   HENT:  Positive for  congestion.   Respiratory: Negative.  Negative for shortness of breath.        Nocturnal wheeze  Cardiovascular: Negative.    Physical Exam  BP 136/70 (BP Location: Left Arm, Patient Position: Sitting, Cuff Size: Normal)   Pulse 65   Temp 98.9 F (37.2 C)   Ht 5' 5 (1.651 m)   Wt 154 lb (69.9 kg)   SpO2 95%   BMI 25.63 kg/m  Physical Exam Constitutional:      Appearance: Normal appearance.  HENT:     Head: Normocephalic and atraumatic.   Cardiovascular:     Rate and Rhythm: Normal rate and regular rhythm.  Pulmonary:     Effort: Pulmonary effort is normal.     Breath sounds: Normal breath sounds.     Comments: Distant rales at lung base L>R  Musculoskeletal:        General: Normal range of motion.   Skin:    General: Skin is warm and dry.   Neurological:     General: No focal deficit present.     Mental Status: She is alert and oriented to person, place, and time. Mental status is at baseline.   Psychiatric:        Mood and Affect: Mood normal.        Behavior: Behavior normal.        Thought Content: Thought content normal.        Judgment: Judgment normal.      Lab Results:  CBC    Component Value Date/Time   WBC 5.7 08/24/2023 0854   RBC 3.33 (L) 08/24/2023 0854   HGB 8.5 (L) 08/24/2023 0854   HGB 9.4 (L) 10/13/2020 1450   HGB 10.7 (L) 11/15/2013 0955   HCT 27.9 (L) 08/24/2023 0854   HCT 30.5 (L) 10/13/2020 1450   HCT 34.1 (L) 11/15/2013 0955   PLT 241 08/24/2023 0854   PLT 267 10/13/2020 1450   MCV 83.8 08/24/2023 0854   MCV 84 10/13/2020 1450   MCV 87.3 11/15/2013 0955   MCH 25.5 (L) 08/24/2023 0854   MCHC 30.5 08/24/2023 0854   RDW 17.6 (H) 08/24/2023 0854   RDW 16.0 (H) 10/13/2020 1450   RDW 17.0 (H) 11/15/2013 0955   LYMPHSABS 1.4 08/24/2023 0854   LYMPHSABS 0.9  11/15/2013 0955   MONOABS 0.4 08/24/2023 0854   MONOABS 0.3 11/15/2013 0955   EOSABS 0.1 08/24/2023 0854   EOSABS 0.1 11/15/2013 0955   BASOSABS 0.0 08/24/2023 0854    BASOSABS 0.0 11/15/2013 0955    BMET    Component Value Date/Time   NA 138 03/15/2023 1446   NA 142 05/28/2012 1600   K 4.0 03/15/2023 1446   K 3.7 05/28/2012 1600   CL 101 03/15/2023 1446   CL 103 05/28/2012 1600   CO2 30 03/15/2023 1446   CO2 29 05/28/2012 1600   GLUCOSE 72 03/15/2023 1446   GLUCOSE 99 05/28/2012 1600   BUN 21 03/15/2023 1446   BUN 19.3 05/28/2012 1600   CREATININE 0.89 03/15/2023 1446   CREATININE 0.92 11/18/2021 0932   CREATININE 0.88 12/06/2019 1147   CREATININE 0.8 05/28/2012 1600   CALCIUM 9.7 03/15/2023 1446   CALCIUM 9.5 05/28/2012 1600   GFRNONAA >60 11/18/2021 0932   GFRAA >60 04/23/2018 1634    BNP    Component Value Date/Time   BNP 79.9 04/23/2018 1634    ProBNP No results found for: PROBNP  Imaging: No results found.   Assessment & Plan:   No problem-specific Assessment & Plan notes found for this encounter.  Assessment and Plan    Emphysema Significant secondhand smoke exposure, and chest x-ray showing emphysema. Moderate diffusion defect on pulmonary function testing. Patient is relatively asymptomatic and would prefer not to be started on maintenance BD regimen.   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 Occasional nocturnal wheezing reported. Childhood asthma history. Prefers to avoid inhaler unless symptoms worsen. - Repeat PFT in 6 months to monitor lung function. - Consider maintenance inhaler with low-dose steroid and bronchodilator if symptoms worsen.  Mild interstitial lung disease CT scan reveals very mild changes in lung parenchyma, suggestive of early interstitial lung disease. Indeterminate at this stage for UIP.  - Recommend high resolution CT scan in 1 year to assess for progression of fibrosis.  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 Uses a HEPA air filter but unsure  about maintenance. Regular cleaning and vacuuming advised. - Provide information on dust mite allergen management. - Recommend using a HEPA air filter in the bedroom. - Advise regular cleaning and vacuuming.  Mold allergy Mild mold allergy. Resides in an older house with mold history. Advised to check for mold presence and consider removal if found. - Recommend checking home for mold and removing if present. - Advise wearing a mask during gardening, although reports difficulty breathing with a mask.  Scoliosis Scoliosis potentially contributing to restrictive lung function but not to fibrotic lung changes.  Follow-up - Schedule repeat PFT in 6 months. - Schedule repeat CT scan in 1 year.  Recording duration: 15 minutes      Antonio Baumgarten, NP 10/11/2023

## 2023-10-11 NOTE — Patient Instructions (Addendum)
 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 You reported occasional wheezing at night. We will repeat your pulmonary function test in 6 months and have prescribed a rescue inhaler for emergency use. If your symptoms worsen, we may consider a maintenance inhaler.  -MILD INTERSTITIAL LUNG DISEASE: Mild interstitial lung disease involves slight changes in the lung tissue, which can lead to scarring. Your CT scan showed very mild changes. We will repeat the CT scan in 1 year to check for any progression and consider a bronchoscopy if significant changes are noted.  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 You have a high allergy to dust mites. We recommend using a HEPA air filter in your bedroom and regular cleaning and vacuuming to manage this allergy.  -MOLD ALLERGY: A mold allergy can cause similar symptoms to other allergies, such as sneezing and congestion. Since you live in an older house with a history of mold, we recommend checking for mold and removing it if found. We also advise wearing a mask while gardening, even though it may be difficult due to breathing issues.  -SCOLIOSIS: Scoliosis is a condition where the spine curves sideways, which can sometimes affect lung function. It may be contributing to your restrictive lung function but is not causing fibrotic lung changes.  INSTRUCTIONS: Please schedule a repeat pulmonary function test in 6 months and a repeat CT scan in 1 year. Use the prescribed rescue inhaler if your symptoms worsen. Follow the recommendations for managing your allergies, including using a HEPA air filter and checking your home for mold.  Orders: HRCT in 1 year   Follow-up: 6 months with Dr. Bertrum Brodie with 30 min  PFT prior     Dust Mite Allergen Extract Injection What is this medication? DUST MITE ALLERGEN EXTRACT (dest mite AL er jen EK strakt) helps diagnose dust mite allergy. It may be used to reduce allergy symptoms, such as sneezing, itching, or runny or stuffy nose from dust mite allergies. It works by exposing your immune system to small amounts of dust mite allergens. Do not use it to treat an allergic reaction. This medicine may be used for other purposes; ask your health care provider or pharmacist if you have questions. What should I tell my care team before I take this medication? They need to know if you have any of these conditions: Heart disease High blood pressure Irregular heartbeat or rhythm Lung or breathing disease, such as asthma or COPD Recent or upcoming allergy shots An unusual or allergic reaction to mite extract, yeast, pork, other medications, foods, dyes, or preservatives Pregnant or trying to get pregnant Breastfeeding How should I use this medication? This medication is injected under the skin. It is usually given by your care team in a hospital or clinic setting. Talk to your care team about the use of this medication in children. While it may be given to children for selected conditions, precautions do apply. Overdosage: If you think you have taken too much of this medicine contact a poison control center or emergency room at once. NOTE: This medicine is only for you. Do not share this medicine with others. What if I miss a dose? Keep appointments for follow-up doses. It is important not to miss your dose. Call your care team if you  are unable to keep an appointment. What may interact with this medication? Antihistamines Certain medications for blood pressure, heart disease, irregular heartbeat, such as propranolol, metoprolol  Certain medications for depression, such as amitriptyline, nortriptyline Dopamine Ephedrine  Terbutaline This list may not describe all  possible interactions. Give your health care provider a list of all the medicines, herbs, non-prescription drugs, or dietary supplements you use. Also tell them if you smoke, drink alcohol, or use illegal drugs. Some items may interact with your medicine. What should I watch for while using this medication? Visit your care team for regular checks on your progress. Tell your care team if your symptoms do not start to get better or if they get worse. Your care team should prescribe auto-injectable epinephrine  for you to keep at home for treating a severe allergic reaction if needed. Your care team will tell you how to use the auto-injectable epinephrine . Some medications may make epinephrine  not work as well or worsen side effects. Talk with your care team if you take any of these medications: Certain medications for blood pressure, heart disease, irregular heartbeat Certain medications for depression, anxiety, or mental health disorders Chlorpheniramine Diphenhydramine  Diuretics Ergot alkaloids, such as dihydroergotamine, ergonovine, ergotamine, methylergonovine MAOIs, such as Carbex, Eldepryl, Marplan, Nardil, and Parnate Thyroid  hormone What side effects may I notice from receiving this medication? Side effects that you should report to your care team as soon as possible: Allergic reactions or angioedema--skin rash, itching or hives, swelling of the face, eyes, lips, tongue, arms, or legs, trouble swallowing or breathing Side effects that usually do not require medical attention (report these to your care team if they continue or are bothersome): Itching Pain, redness, or irritation at injection site Sneezing Swelling of the ankles, hands, or feet This list may not describe all possible side effects. Call your doctor for medical advice about side effects. You may report side effects to FDA at 1-800-FDA-1088. Where should I keep my medication? This medication is given in a hospital or clinic.  It will not be stored at home. NOTE: This sheet is a summary. It may not cover all possible information. If you have questions about this medicine, talk to your doctor, pharmacist, or health care provider.  2024 Elsevier/Gold Standard (2023-03-24 00:00:00)

## 2023-10-11 NOTE — Patient Instructions (Signed)
 Full PFT performed today.

## 2023-10-12 ENCOUNTER — Inpatient Hospital Stay: Attending: Hematology and Oncology

## 2023-10-12 ENCOUNTER — Other Ambulatory Visit: Payer: Self-pay | Admitting: Hematology and Oncology

## 2023-10-12 ENCOUNTER — Inpatient Hospital Stay: Admitting: Hematology and Oncology

## 2023-10-12 DIAGNOSIS — D638 Anemia in other chronic diseases classified elsewhere: Secondary | ICD-10-CM | POA: Diagnosis not present

## 2023-10-12 DIAGNOSIS — D509 Iron deficiency anemia, unspecified: Secondary | ICD-10-CM | POA: Diagnosis present

## 2023-10-12 DIAGNOSIS — E611 Iron deficiency: Secondary | ICD-10-CM | POA: Diagnosis not present

## 2023-10-12 DIAGNOSIS — D539 Nutritional anemia, unspecified: Secondary | ICD-10-CM | POA: Insufficient documentation

## 2023-10-12 DIAGNOSIS — D649 Anemia, unspecified: Secondary | ICD-10-CM

## 2023-10-12 DIAGNOSIS — Z79899 Other long term (current) drug therapy: Secondary | ICD-10-CM | POA: Insufficient documentation

## 2023-10-12 DIAGNOSIS — Q971 Female with more than three X chromosomes: Secondary | ICD-10-CM | POA: Diagnosis not present

## 2023-10-12 DIAGNOSIS — K922 Gastrointestinal hemorrhage, unspecified: Secondary | ICD-10-CM | POA: Insufficient documentation

## 2023-10-12 DIAGNOSIS — D500 Iron deficiency anemia secondary to blood loss (chronic): Secondary | ICD-10-CM

## 2023-10-12 LAB — CBC WITH DIFFERENTIAL/PLATELET
Abs Immature Granulocytes: 0.02 10*3/uL (ref 0.00–0.07)
Basophils Absolute: 0 10*3/uL (ref 0.0–0.1)
Basophils Relative: 1 %
Eosinophils Absolute: 0.3 10*3/uL (ref 0.0–0.5)
Eosinophils Relative: 8 %
HCT: 30.3 % — ABNORMAL LOW (ref 36.0–46.0)
Hemoglobin: 9.2 g/dL — ABNORMAL LOW (ref 12.0–15.0)
Immature Granulocytes: 1 %
Lymphocytes Relative: 20 %
Lymphs Abs: 0.8 10*3/uL (ref 0.7–4.0)
MCH: 27.5 pg (ref 26.0–34.0)
MCHC: 30.4 g/dL (ref 30.0–36.0)
MCV: 90.4 fL (ref 80.0–100.0)
Monocytes Absolute: 0.4 10*3/uL (ref 0.1–1.0)
Monocytes Relative: 9 %
Neutro Abs: 2.7 10*3/uL (ref 1.7–7.7)
Neutrophils Relative %: 61 %
Platelets: 294 10*3/uL (ref 150–400)
RBC: 3.35 MIL/uL — ABNORMAL LOW (ref 3.87–5.11)
RDW: 21.4 % — ABNORMAL HIGH (ref 11.5–15.5)
WBC: 4.3 10*3/uL (ref 4.0–10.5)
nRBC: 0 % (ref 0.0–0.2)

## 2023-10-12 LAB — VITAMIN B12: Vitamin B-12: 944 pg/mL — ABNORMAL HIGH (ref 180–914)

## 2023-10-12 LAB — IRON AND IRON BINDING CAPACITY (CC-WL,HP ONLY)
Iron: 32 ug/dL (ref 28–170)
Saturation Ratios: 11 % (ref 10.4–31.8)
TIBC: 280 ug/dL (ref 250–450)
UIBC: 248 ug/dL (ref 148–442)

## 2023-10-12 LAB — FERRITIN: Ferritin: 201 ng/mL (ref 11–307)

## 2023-10-13 LAB — ERYTHROPOIETIN: Erythropoietin: 57.2 m[IU]/mL — ABNORMAL HIGH (ref 2.6–18.5)

## 2023-10-16 ENCOUNTER — Other Ambulatory Visit: Payer: Self-pay | Admitting: Hematology and Oncology

## 2023-10-19 ENCOUNTER — Inpatient Hospital Stay: Admitting: Hematology and Oncology

## 2023-10-19 ENCOUNTER — Ambulatory Visit: Payer: Medicare Other

## 2023-10-19 ENCOUNTER — Inpatient Hospital Stay

## 2023-10-19 ENCOUNTER — Encounter: Payer: Self-pay | Admitting: Hematology and Oncology

## 2023-10-19 VITALS — BP 137/50

## 2023-10-19 VITALS — BP 137/50 | HR 71 | Temp 97.5°F | Resp 18 | Ht 65.0 in | Wt 153.0 lb

## 2023-10-19 DIAGNOSIS — Q971 Female with more than three X chromosomes: Secondary | ICD-10-CM | POA: Diagnosis not present

## 2023-10-19 DIAGNOSIS — D539 Nutritional anemia, unspecified: Secondary | ICD-10-CM

## 2023-10-19 DIAGNOSIS — N939 Abnormal uterine and vaginal bleeding, unspecified: Secondary | ICD-10-CM | POA: Diagnosis not present

## 2023-10-19 DIAGNOSIS — D500 Iron deficiency anemia secondary to blood loss (chronic): Secondary | ICD-10-CM

## 2023-10-19 MED ORDER — DARBEPOETIN ALFA 200 MCG/0.4ML IJ SOSY
200.0000 ug | PREFILLED_SYRINGE | Freq: Once | INTRAMUSCULAR | Status: AC
  Administered 2023-10-19: 200 ug via SUBCUTANEOUS
  Filled 2023-10-19: qty 0.4

## 2023-10-19 NOTE — Assessment & Plan Note (Addendum)
 She has recurrent vaginal bleeding due to uterine prolapse She has appointment to follow-up with her gynecologist next month We discussed consideration for hysterectomy

## 2023-10-19 NOTE — Patient Instructions (Signed)

## 2023-10-19 NOTE — Progress Notes (Signed)
 [facility] OFFICE PROGRESS NOTE  Panosh, Apolinar POUR, MD  ASSESSMENT & PLAN:  Assessment & Plan Deficiency anemia The patient has multifactorial anemia, combination of iron  deficiency due to history of chronic GI bleed as well as bone marrow disorder I reviewed her recent labs which show adequate iron  replacement, normal B12 but with inadequate erythropoiesis likely due to her bone marrow disorder/chronic kidney disease I recommend darbepoetin injection She has received this before and responded well to treatment She has longstanding chronic history of severe anemia and after today's injection, I will see her back in August for further follow-up Vaginal bleeding She has recurrent vaginal bleeding due to uterine prolapse She has appointment to follow-up with her gynecologist next month We discussed consideration for hysterectomy    No orders of the defined types were placed in this encounter.   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 Summary of her history is as follows: This is a pleasant woman with multifactorial anemia and associated leukopenia. She has a clear element of iron  malabsorption with a superimposed normochromic anemia and chronic leukopenia. The chronic anemia and leukopenia go back as far as we have records (2007) when white counts ran as low as 2700. She has a normal differential. White count average is 3000 and has not changed. Platelet count is normal. Best hemoglobin she achieves with parenteral iron  replacement is 11 g. She had a nondiagnostic bone marrow biopsy done in 2006. She  had received intravenous iron  infusion in the past. Her last colonoscopy in May 2013 showed severe diverticulosis with internal hemorrhoids. She feels well. Denies recent infection. She denies symptoms of fatigue. She tolerated iron  supplement well. She had periodic hemorrhoidal bleeding. She was found to have abnormal CBC from intermittently over the years by her primary care doctor She has started taking oral iron  supplements since November of last year Most recently, she have noted passage of dark stool She attempted to increase her oral iron  supplement but still complain of excessive fatigue Recently, her repeat CBC has dropped from 11-8.2 and hence she was referred back here for further evaluation On 12/15/20, she had repeat EGD and colonoscopy which showed: - Severe diverticulosis in the sigmoid colon, in the descending colon, in the transverse colon and in the ascending colon. - Non-bleeding external and internal hemorrhoids. - No specimens collected.  She denies recent chest pain on exertion, shortness of breath on minimal exertion, pre-syncopal episodes, or palpitations.  The patient denies over the counter NSAID ingestion. She is not on antiplatelets agents. Her last colonoscopy was in 2013 She had no prior history or diagnosis of cancer. Her age appropriate screening programs are up-to-date. She denies any pica and eats a variety of diet.  The patient was prescribed oral iron  supplements and she takes 1 dose of oral iron  supplement at night to avoid constipation From Sept 2021 till present, she has received many doses of intravenous iron  infusion On December 15, 2020, she had repeat upper endoscopy evaluation due to severe anemia which showed LA Grade B reflux esophagitis with no bleeding. 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 -  Gastritis. Biopsied. - Normal examined duodenum.  Since Monoferric  IV iron  in October 2022,  her iron  deficiency resolved On May 28, 2021, she started on Aranesp  injection  On 06/18/21, she had repeat GI procedure PROCEDURE NOTE: The patient presents with symptomatic grade II-III  hemorrhoids, requesting rubber band ligation of his/her hemorrhoidal disease.  All risks, benefits and alternative forms of therapy were described and informed consent was obtained.   In the Left Lateral Decubitus position anoscopic examination revealed grade II-III hemorrhoids in the left lateral and right posterior position(s).  The anorectum was pre-medicated with 0.125% Nitroglycerine and Recticare The decision was made to band the left lateral internal hemorrhoid, and the [facility] O'Regan System was used to perform band ligation without complication.  Digital anorectal examination was then performed to assure proper positioning of the band, and to adjust the banded tissue as required. The patient was discharged home without pain or other issues.  Dietary and behavioral recommendations were given and along with follow-up instructions.    In March 2023, she received both blood transfusion and intravenous iron  infusion On October 15, 2021, she underwent ligation and resection of hemorrhoid On 11/09/2011, she received another unit of blood due to severe rectal bleeding On 11/29/2021, she received 1 dose of darbepoetin injection In 2024, she received intravenous iron  sucrose and 2 doses of darbepoetin injection Since earlier this year, she received 3 doses of intravenous iron   Vitals:   10/19/23 0813  BP: (!) 137/50  Pulse: 71  Resp: 18  Temp: (!) 97.5 F (36.4 C)  SpO2: 97%

## 2023-10-19 NOTE — Assessment & Plan Note (Addendum)
 The patient has multifactorial anemia, combination of iron  deficiency due to history of chronic GI bleed as well as bone marrow disorder I reviewed her recent labs which show adequate iron  replacement, normal B12 but with inadequate erythropoiesis likely due to her bone marrow disorder/chronic kidney disease I recommend darbepoetin injection She has received this before and responded well to treatment She has longstanding chronic history of severe anemia and after today's injection, I will see her back in August for further follow-up

## 2023-10-20 ENCOUNTER — Ambulatory Visit (INDEPENDENT_AMBULATORY_CARE_PROVIDER_SITE_OTHER)

## 2023-10-20 DIAGNOSIS — M810 Age-related osteoporosis without current pathological fracture: Secondary | ICD-10-CM | POA: Diagnosis not present

## 2023-10-20 MED ORDER — DENOSUMAB 60 MG/ML ~~LOC~~ SOSY
60.0000 mg | PREFILLED_SYRINGE | Freq: Once | SUBCUTANEOUS | Status: AC
  Administered 2023-10-20: 60 mg via SUBCUTANEOUS

## 2023-10-20 NOTE — Progress Notes (Signed)
 Patient is in office today for a nurse visit for Prolia  Injection 60mg . Patient Injection was given in the  Right arm. Patient tolerated injection well.

## 2023-11-02 ENCOUNTER — Ambulatory Visit: Admitting: Obstetrics and Gynecology

## 2023-11-02 ENCOUNTER — Other Ambulatory Visit (HOSPITAL_COMMUNITY)
Admission: RE | Admit: 2023-11-02 | Discharge: 2023-11-02 | Disposition: A | Discharge status: Home / Self Care | Source: Other Acute Inpatient Hospital | Attending: Obstetrics and Gynecology | Admitting: Obstetrics and Gynecology

## 2023-11-02 VITALS — BP 126/57 | HR 58

## 2023-11-02 DIAGNOSIS — R8290 Unspecified abnormal findings in urine: Secondary | ICD-10-CM

## 2023-11-02 DIAGNOSIS — N950 Postmenopausal bleeding: Secondary | ICD-10-CM

## 2023-11-02 DIAGNOSIS — Z466 Encounter for fitting and adjustment of urinary device: Secondary | ICD-10-CM | POA: Diagnosis not present

## 2023-11-02 DIAGNOSIS — N3946 Mixed incontinence: Secondary | ICD-10-CM

## 2023-11-02 DIAGNOSIS — N813 Complete uterovaginal prolapse: Secondary | ICD-10-CM

## 2023-11-02 DIAGNOSIS — N814 Uterovaginal prolapse, unspecified: Secondary | ICD-10-CM

## 2023-11-02 LAB — POCT URINALYSIS DIP (CLINITEK)
Bilirubin, UA: NEGATIVE
Glucose, UA: NEGATIVE mg/dL
Ketones, POC UA: NEGATIVE mg/dL
Nitrite, UA: POSITIVE — AB
POC PROTEIN,UA: 30 — AB
Spec Grav, UA: 1.025 (ref 1.010–1.025)
Urobilinogen, UA: 0.2 U/dL
pH, UA: 6 (ref 5.0–8.0)

## 2023-11-02 LAB — URINALYSIS, ROUTINE W REFLEX MICROSCOPIC
Bilirubin Urine: NEGATIVE
Glucose, UA: NEGATIVE mg/dL
Ketones, ur: NEGATIVE mg/dL
Nitrite: POSITIVE — AB
Protein, ur: 30 mg/dL — AB
Specific Gravity, Urine: 1.021 (ref 1.005–1.030)
WBC, UA: >50 WBC/hpf (ref 0–5)
pH: 5 (ref 5.0–8.0)

## 2023-11-02 MED ORDER — NITROFURANTOIN MONOHYD MACRO 100 MG PO CAPS: 100.0000 mg | ORAL_CAPSULE | Freq: Two times a day (BID) | ORAL | 0 refills | Status: AC

## 2023-11-02 NOTE — Progress Notes (Signed)
 [facility]   Subjective:     Chief Complaint: Pessary Check Belinda Harper is a 83 y.o. female is here for pessary check/cleaning.)  History of Present Illness: Belinda Harper is a 83 y.o. female with stage IV pelvic organ prolapse who presents today for a pessary fitting.    Past Medical History: Patient  has a past medical history of Allergic rhinitis, Anxiety, Bradycardia, Chronic constipation, Chronic gastritis, Depression, Family history of adverse reaction to anesthesia, GERD (gastroesophageal reflux disease), Heart palpitations, Hemorrhoids, History of asthma, History of sepsis (02/05/2015), Hyperlipidemia, Hypertension, Iron  deficiency anemia secondary to blood loss (chronic), Leukopenia, OA (osteoarthritis), Osteoporosis, Presence of pessary, PSVT (paroxysmal supraventricular tachycardia) (HCC), Rectal bleeding, Uterine prolapse, and Wears glasses.   Past Surgical History: She  has a past surgical history that includes ORIF ankle fracture (Left, 03/21/2009); Cataract extraction w/ intraocular lens implant (Bilateral); Revision amputation of finger (1977); Mass excision (Left, 09/11/2012); Hardware Removal (Left, 02/21/2015); Colonoscopy with esophagogastroduodenoscopy (egd) (12/15/2020); and Transanal hemorrhoidal dearterialization (N/A, 10/15/2021).   Medications: She has a current medication list which includes the following prescription(s): amlodipine , calcium citrate-vitamin d , cyanocobalamin , darbepoetin alfa , denosumab , docusate sodium , magnesium glycinate, menaquinone-7, metoprolol  succinate, nitrofurantoin  (macrocrystal-monohydrate), and wheat dextrin.   Allergies: Patient is allergic to sulfamethoxazole, venofer  [iron  sucrose], aspirin, nsaids, sulfa antibiotics, and yellow jacket venom.   Social History: Patient  reports that she has never smoked. She has been exposed to tobacco smoke. She has never used smokeless tobacco. She reports that she does not  currently use alcohol. She reports that she does not use drugs.      Objective:    BP (!) 126/57   Pulse (!) 58  Gen: No apparent distress, A&O x 3. Pelvic Exam: Normal external female genitalia; Bartholin's and Skene's glands normal in appearance; urethral meatus prolapsed, no urethral masses or discharge.   A size #4 long stem gellhorn pessary was fitted. It was comfortable, stayed in place with valsalva and was an appropriate size on examination, with one finger fitting between the pessary and the vaginal walls. Patient was able to cough, squat, valsalva, and urinate without pessary expulsion.   Assessment/Plan:    Assessment: [name] is a 83 y.o. with stage IV pelvic organ prolapse who presents for a pessary fitting. Plan: She was fitted with a #4 long stem gellhorn pessary. She will keep the pessary in place until next visit. She will use estrogen.   Will send clearance letters to Pulmonology and Cardiology. Patient is desiring a Hysterectomy with Colpocleisis. She is not a great surgical candidate and Dr. Guadlupe is aware.   Follow-up in 3 months for a pessary check or sooner as needed.  All questions were answered.    Shenise Wolgamott G Brandey Vandalen, NP

## 2023-11-04 LAB — URINE CULTURE: Culture: >=100000 — AB

## 2023-11-06 ENCOUNTER — Ambulatory Visit: Payer: Self-pay | Admitting: Obstetrics and Gynecology

## 2023-11-22 ENCOUNTER — Other Ambulatory Visit: Payer: Self-pay | Admitting: Obstetrics

## 2023-11-22 ENCOUNTER — Encounter: Payer: Self-pay | Admitting: Obstetrics and Gynecology

## 2023-11-22 DIAGNOSIS — N3946 Mixed incontinence: Secondary | ICD-10-CM

## 2023-11-22 DIAGNOSIS — N368 Other specified disorders of urethra: Secondary | ICD-10-CM

## 2023-11-22 MED ORDER — ESTRADIOL 0.1 MG/GM VA CREA: TOPICAL_CREAM | VAGINAL | 3 refills | Status: DC

## 2023-11-22 NOTE — Progress Notes (Signed)
 Rx estradiol  to use 1g twice a week.  Patient advised to follow-up with Dr. Nikki for workup due to persistent postmenopausal bleeding

## 2023-11-23 ENCOUNTER — Inpatient Hospital Stay: Attending: Hematology and Oncology

## 2023-11-23 DIAGNOSIS — K922 Gastrointestinal hemorrhage, unspecified: Secondary | ICD-10-CM | POA: Insufficient documentation

## 2023-11-23 DIAGNOSIS — D500 Iron deficiency anemia secondary to blood loss (chronic): Secondary | ICD-10-CM

## 2023-11-23 DIAGNOSIS — D509 Iron deficiency anemia, unspecified: Secondary | ICD-10-CM | POA: Insufficient documentation

## 2023-11-23 LAB — CBC WITH DIFFERENTIAL/PLATELET
Abs Immature Granulocytes: 0 K/uL (ref 0.00–0.07)
Basophils Absolute: 0.1 K/uL (ref 0.0–0.1)
Basophils Relative: 1 %
Eosinophils Absolute: 0.3 K/uL (ref 0.0–0.5)
Eosinophils Relative: 7 %
HCT: 36.9 % (ref 36.0–46.0)
Hemoglobin: 11.3 g/dL — ABNORMAL LOW (ref 12.0–15.0)
Immature Granulocytes: 0 %
Lymphocytes Relative: 32 %
Lymphs Abs: 1.2 K/uL (ref 0.7–4.0)
MCH: 25.8 pg — ABNORMAL LOW (ref 26.0–34.0)
MCHC: 30.6 g/dL (ref 30.0–36.0)
MCV: 84.2 fL (ref 80.0–100.0)
Monocytes Absolute: 0.3 K/uL (ref 0.1–1.0)
Monocytes Relative: 8 %
Neutro Abs: 1.9 K/uL (ref 1.7–7.7)
Neutrophils Relative %: 52 %
Platelets: 189 K/uL (ref 150–400)
RBC: 4.38 MIL/uL (ref 3.87–5.11)
RDW: 19 % — ABNORMAL HIGH (ref 11.5–15.5)
WBC: 3.7 K/uL — ABNORMAL LOW (ref 4.0–10.5)
nRBC: 0 % (ref 0.0–0.2)

## 2023-11-23 LAB — IRON AND IRON BINDING CAPACITY (CC-WL,HP ONLY)
Iron: 34 ug/dL (ref 28–170)
Saturation Ratios: 11 % (ref 10.4–31.8)
TIBC: 319 ug/dL (ref 250–450)
UIBC: 285 ug/dL (ref 148–442)

## 2023-11-23 LAB — FERRITIN: Ferritin: 60 ng/mL (ref 11–307)

## 2023-11-29 ENCOUNTER — Other Ambulatory Visit: Payer: Self-pay

## 2023-11-29 DIAGNOSIS — J849 Interstitial pulmonary disease, unspecified: Secondary | ICD-10-CM

## 2023-11-30 ENCOUNTER — Ambulatory Visit

## 2023-11-30 ENCOUNTER — Encounter: Payer: Self-pay | Admitting: Hematology and Oncology

## 2023-11-30 ENCOUNTER — Inpatient Hospital Stay: Attending: Hematology and Oncology | Admitting: Hematology and Oncology

## 2023-11-30 ENCOUNTER — Inpatient Hospital Stay

## 2023-11-30 ENCOUNTER — Other Ambulatory Visit (HOSPITAL_COMMUNITY): Payer: Self-pay

## 2023-11-30 VITALS — BP 158/62 | HR 59 | Temp 97.7°F | Resp 18 | Ht 65.0 in | Wt 156.4 lb

## 2023-11-30 DIAGNOSIS — D539 Nutritional anemia, unspecified: Secondary | ICD-10-CM

## 2023-11-30 DIAGNOSIS — N814 Uterovaginal prolapse, unspecified: Secondary | ICD-10-CM | POA: Insufficient documentation

## 2023-11-30 DIAGNOSIS — D509 Iron deficiency anemia, unspecified: Secondary | ICD-10-CM | POA: Diagnosis present

## 2023-11-30 DIAGNOSIS — N3946 Mixed incontinence: Secondary | ICD-10-CM

## 2023-11-30 DIAGNOSIS — N368 Other specified disorders of urethra: Secondary | ICD-10-CM

## 2023-11-30 DIAGNOSIS — N939 Abnormal uterine and vaginal bleeding, unspecified: Secondary | ICD-10-CM | POA: Diagnosis present

## 2023-11-30 MED ORDER — ESTRADIOL 0.1 MG/GM VA CREA
TOPICAL_CREAM | VAGINAL | 3 refills | Status: AC
  Filled 2023-11-30: qty 42.5, 90d supply, fill #0

## 2023-11-30 NOTE — Progress Notes (Signed)
 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, combination of iron  deficiency due to history of chronic GI bleed as well as bone marrow disorder I reviewed her recent labs which show adequate iron  replacement, normal B12 but with inadequate erythropoiesis likely due to her bone marrow disorder/chronic kidney disease After recent dose of darbepoetin injection, hemoglobin is now over 11 However, her ferritin level has dropped I suspect she might need iron  infusion again I will get her repeat labs and of the months and see her next month for further follow-up Vaginal bleeding She has recurrent vaginal bleeding due to uterine prolapse She is considering hysterectomy She felt that vaginal estrogen cream was helpful but her insurance would not authorize a refill due to the patient being prescribed or using the dose higher than usual I recommend the patient to check out at the local pharmacy and to find out the cost out-of-pocket I refilled her prescription today    No orders of the defined types were placed in this encounter.   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 Summary of her history is as follows: This is a pleasant woman with multifactorial anemia and associated leukopenia. She has a clear element of iron  malabsorption with a superimposed normochromic anemia and chronic leukopenia. The chronic anemia and leukopenia go back as far as we have records (2007) when white counts ran as low as 2700. She has a normal differential.  White count average is 3000 and has not changed. Platelet count is normal. Best hemoglobin she achieves with parenteral iron  replacement is 11 g. She had a nondiagnostic bone marrow biopsy done in 2006. She had received intravenous iron  infusion in the past. Her last colonoscopy in May 2013 showed severe diverticulosis with internal hemorrhoids. She feels well. Denies recent infection. She denies symptoms of fatigue. She tolerated iron  supplement well. She had periodic hemorrhoidal bleeding. She was found to have abnormal CBC from intermittently over the years by her primary care doctor She has started taking oral iron  supplements since November of last year Most recently, she have noted passage of dark stool She attempted to increase her oral iron  supplement but still complain of excessive fatigue Recently, her repeat CBC has dropped from 11-8.2 and hence she was referred back here for further evaluation On 12/15/20, she had repeat EGD and colonoscopy which showed: - Severe diverticulosis in the sigmoid colon, in the descending colon, in the transverse colon and in the ascending colon. - Non-bleeding external and internal hemorrhoids. - No specimens collected.  She denies recent chest pain on exertion, shortness of breath on minimal exertion, pre-syncopal episodes, or palpitations.  The patient denies over the counter NSAID ingestion. She is not on antiplatelets agents. Her last colonoscopy was in 2013 She had no prior history or diagnosis of cancer. Her age appropriate screening programs are up-to-date. She denies any pica and eats a variety of diet.  The patient was prescribed oral iron  supplements and she takes 1 dose of oral iron  supplement at night to avoid constipation From Sept 2021 till present, she has received  many doses of intravenous iron  infusion On December 15, 2020, she had repeat upper endoscopy evaluation due to severe anemia which showed LA Grade B reflux esophagitis with  no bleeding. 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 - Gastritis. Biopsied. - Normal examined duodenum.  Since Monoferric  IV iron  in October 2022, her iron  deficiency resolved On May 28, 2021, she started on Aranesp  injection  On 06/18/21, she had repeat GI procedure PROCEDURE NOTE: The patient presents with symptomatic grade II-III  hemorrhoids, requesting rubber band ligation of his/her hemorrhoidal disease.  All risks, benefits and alternative forms of therapy were described and informed consent was obtained.   In the Left Lateral Decubitus position anoscopic examination revealed grade II-III hemorrhoids in the left lateral and right posterior position(s).  The anorectum was pre-medicated with 0.125% Nitroglycerine and Recticare The decision was made to band the left lateral internal hemorrhoid, and the [facility] O'Regan System was used to perform band ligation without complication.  Digital anorectal examination was then performed to assure proper positioning of the band, and to adjust the banded tissue as required. The patient was discharged home without pain or other issues.  Dietary and behavioral recommendations were given and along with follow-up instructions.    In March 2023, she received both blood transfusion and intravenous iron  infusion On October 15, 2021, she underwent ligation and resection of hemorrhoid On 11/09/2011, she received another unit of blood due to severe rectal bleeding On 11/29/2021, she received 1 dose of darbepoetin injection In 2024, she received intravenous iron  sucrose and 2 doses of darbepoetin injection Since earlier this year, she received 3 doses of intravenous iron   Lab Results  Component Value Date   VITAMINB12 944 (H) 10/12/2023   FERRITIN 60 11/23/2023   Vitals:   11/30/23 0938  BP: (!) 158/62  Pulse: (!) 59  Resp: 18  Temp: 97.7 F (36.5 C)  SpO2: 98%

## 2023-11-30 NOTE — Assessment & Plan Note (Addendum)
 The patient has multifactorial anemia, combination of iron  deficiency due to history of chronic GI bleed as well as bone marrow disorder I reviewed her recent labs which show adequate iron  replacement, normal B12 but with inadequate erythropoiesis likely due to her bone marrow disorder/chronic kidney disease After recent dose of darbepoetin injection, hemoglobin is now over 11 However, her ferritin level has dropped I suspect she might need iron  infusion again I will get her repeat labs and of the months and see her next month for further follow-up

## 2023-11-30 NOTE — Assessment & Plan Note (Addendum)
 She has recurrent vaginal bleeding due to uterine prolapse She is considering hysterectomy She felt that vaginal estrogen cream was helpful but her insurance would not authorize a refill due to the patient being prescribed or using the dose higher than usual I recommend the patient to check out at the local pharmacy and to find out the cost out-of-pocket I refilled her prescription today

## 2023-12-06 ENCOUNTER — Other Ambulatory Visit: Payer: Self-pay

## 2023-12-06 DIAGNOSIS — J849 Interstitial pulmonary disease, unspecified: Secondary | ICD-10-CM

## 2023-12-21 ENCOUNTER — Inpatient Hospital Stay

## 2023-12-21 DIAGNOSIS — D509 Iron deficiency anemia, unspecified: Secondary | ICD-10-CM

## 2023-12-21 DIAGNOSIS — D500 Iron deficiency anemia secondary to blood loss (chronic): Secondary | ICD-10-CM

## 2023-12-21 LAB — CBC WITH DIFFERENTIAL/PLATELET
Abs Immature Granulocytes: 0.01 K/uL (ref 0.00–0.07)
Basophils Absolute: 0 K/uL (ref 0.0–0.1)
Basophils Relative: 1 %
Eosinophils Absolute: 0.2 K/uL (ref 0.0–0.5)
Eosinophils Relative: 5 %
HCT: 36.1 % (ref 36.0–46.0)
Hemoglobin: 11.1 g/dL — ABNORMAL LOW (ref 12.0–15.0)
Immature Granulocytes: 0 %
Lymphocytes Relative: 32 %
Lymphs Abs: 1.1 K/uL (ref 0.7–4.0)
MCH: 25.8 pg — ABNORMAL LOW (ref 26.0–34.0)
MCHC: 30.7 g/dL (ref 30.0–36.0)
MCV: 83.8 fL (ref 80.0–100.0)
Monocytes Absolute: 0.3 K/uL (ref 0.1–1.0)
Monocytes Relative: 8 %
Neutro Abs: 1.9 K/uL (ref 1.7–7.7)
Neutrophils Relative %: 54 %
Platelets: 178 K/uL (ref 150–400)
RBC: 4.31 MIL/uL (ref 3.87–5.11)
RDW: 18 % — ABNORMAL HIGH (ref 11.5–15.5)
WBC: 3.6 K/uL — ABNORMAL LOW (ref 4.0–10.5)
nRBC: 0 % (ref 0.0–0.2)

## 2023-12-21 LAB — IRON AND IRON BINDING CAPACITY (CC-WL,HP ONLY)
Iron: 47 ug/dL (ref 28–170)
Saturation Ratios: 14 % (ref 10.4–31.8)
TIBC: 346 ug/dL (ref 250–450)
UIBC: 299 ug/dL (ref 148–442)

## 2023-12-21 LAB — FERRITIN: Ferritin: 53 ng/mL (ref 11–307)

## 2023-12-28 ENCOUNTER — Encounter: Payer: Self-pay | Admitting: Hematology and Oncology

## 2023-12-28 ENCOUNTER — Inpatient Hospital Stay: Attending: Hematology and Oncology | Admitting: Hematology and Oncology

## 2023-12-28 VITALS — BP 163/70 | HR 54 | Temp 97.8°F | Resp 18 | Ht 65.0 in | Wt 156.0 lb

## 2023-12-28 DIAGNOSIS — D72819 Decreased white blood cell count, unspecified: Secondary | ICD-10-CM | POA: Diagnosis not present

## 2023-12-28 DIAGNOSIS — D649 Anemia, unspecified: Secondary | ICD-10-CM

## 2023-12-28 DIAGNOSIS — N939 Abnormal uterine and vaginal bleeding, unspecified: Secondary | ICD-10-CM

## 2023-12-28 DIAGNOSIS — D61818 Other pancytopenia: Secondary | ICD-10-CM | POA: Diagnosis present

## 2023-12-28 DIAGNOSIS — K922 Gastrointestinal hemorrhage, unspecified: Secondary | ICD-10-CM | POA: Insufficient documentation

## 2023-12-28 DIAGNOSIS — D509 Iron deficiency anemia, unspecified: Secondary | ICD-10-CM | POA: Diagnosis present

## 2023-12-28 NOTE — Assessment & Plan Note (Addendum)
She has chronic intermittent leukopenia This has been present since 2007, unknown etiology Recent repeat B12 level in July 2023 was adequate She is not symptomatic Observe for now

## 2023-12-28 NOTE — Progress Notes (Signed)
 [facility] OFFICE PROGRESS NOTE  Panosh, Apolinar POUR, MD  ASSESSMENT & PLAN:  Assessment & Plan Symptomatic anemia She has multifactorial anemia She has chronic intermittent GI bleed Since last time I saw her, she has no further bleeding Anemia is stable I will see her again in 2 months for further follow-up She is educated to watch out for signs and symptoms of bleeding Leukopenia, unspecified type She has chronic intermittent leukopenia This has been present since 2007, unknown etiology Recent repeat B12 level in July 2023 was adequate She is not symptomatic Observe for now Vaginal bleeding This has resolved with topical vaginal estrogen cream She will continue the same    No orders of the defined types were placed in this encounter.   INTERVAL HISTORY: Patient returns for recurrent anemia Symptoms of anemia includes none We reviewed CBC and iron  studies  SUMMARY OF HEMATOLOGIC HISTORY:  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 Summary of her history is as follows: This is a pleasant woman with multifactorial anemia and associated leukopenia. She has a clear element of iron  malabsorption with a superimposed normochromic anemia and chronic leukopenia. The chronic anemia and leukopenia go back as far as we have records (2007) when white counts ran as low as 2700. She has a normal differential. White count average is 3000 and has not changed. Platelet count is normal. Best hemoglobin she achieves with parenteral iron  replacement is 11 g. She had a nondiagnostic bone marrow biopsy done in 2006. She had received intravenous iron  infusion in the past. Her last colonoscopy in May 2013 showed severe diverticulosis with internal hemorrhoids. She feels well. Denies recent infection. She denies  symptoms of fatigue. She tolerated iron  supplement well. She had periodic hemorrhoidal bleeding. She was found to have abnormal CBC from intermittently over the years by her primary care doctor She has started taking oral iron  supplements since November of last year Most recently, she have noted passage of dark stool She attempted to increase her oral iron  supplement but still complain of excessive fatigue Recently, her repeat CBC has dropped from 11-8.2 and hence she was referred back here for further evaluation On 12/15/20, she had repeat EGD and colonoscopy which showed: - Severe diverticulosis in the sigmoid colon, in the descending colon, in the transverse colon and in the ascending colon. - Non-bleeding external and internal hemorrhoids. - No specimens collected.  She denies recent chest pain on exertion, shortness of breath on minimal exertion, pre-syncopal episodes, or palpitations.  The patient denies over the counter NSAID ingestion. She is not on antiplatelets agents. Her last colonoscopy was in 2013 She had no prior history or diagnosis of cancer. Her age appropriate screening programs are up-to-date. She denies any pica and eats a variety of diet.  The patient was prescribed oral iron  supplements and she takes 1 dose of oral iron  supplement at night to avoid constipation From Sept 2021 till present, she has received many doses of intravenous iron  infusion On December 15, 2020, she had repeat upper endoscopy evaluation due to severe anemia which showed LA Grade B reflux esophagitis with no bleeding. 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 - Gastritis. Biopsied. - Normal examined duodenum.  Since Monoferric  IV iron  in October 2022, her iron  deficiency resolved On May 28, 2021, she started on Aranesp  injection  On 06/18/21, she had repeat GI procedure PROCEDURE NOTE: The patient presents with symptomatic grade II-III   hemorrhoids, requesting rubber band ligation of his/her hemorrhoidal disease.  All risks, benefits and alternative forms of therapy were described and informed consent was obtained.   In the Left Lateral Decubitus position anoscopic examination revealed grade II-III hemorrhoids in the left lateral and right posterior position(s).  The anorectum was pre-medicated with 0.125% Nitroglycerine and Recticare The decision was made to band the left lateral internal hemorrhoid, and the [facility] O'Regan System was used to perform band ligation without complication.  Digital anorectal examination was then performed to assure proper positioning of the band, and to adjust the banded tissue as required. The patient was discharged home without pain or other issues.  Dietary and behavioral recommendations were given and along with follow-up instructions.    In March 2023, she received both blood transfusion and intravenous iron  infusion On October 15, 2021, she underwent ligation and resection of hemorrhoid On 11/09/2011, she received another unit of blood due to severe rectal bleeding On 11/29/2021, she received 1 dose of darbepoetin injection In 2024, she received intravenous iron  sucrose and 2 doses of darbepoetin injection In 2025, she received 3 doses of intravenous iron    Lab Results  Component Value Date   VITAMINB12 944 (H) 10/12/2023   FERRITIN 53 12/21/2023   Vitals:   12/28/23 1021  BP: (!) 163/70  Pulse: (!) 54  Resp: 18  Temp: 97.8 F (36.6 C)  SpO2: 96%

## 2023-12-28 NOTE — Assessment & Plan Note (Addendum)
 She has multifactorial anemia She has chronic intermittent GI bleed Since last time I saw her, she has no further bleeding Anemia is stable I will see her again in 2 months for further follow-up She is educated to watch out for signs and symptoms of bleeding

## 2023-12-28 NOTE — Assessment & Plan Note (Addendum)
 This has resolved with topical vaginal estrogen cream She will continue the same

## 2024-01-10 ENCOUNTER — Other Ambulatory Visit: Payer: Self-pay

## 2024-01-10 DIAGNOSIS — M810 Age-related osteoporosis without current pathological fracture: Secondary | ICD-10-CM

## 2024-01-10 MED ORDER — DENOSUMAB 60 MG/ML ~~LOC~~ SOSY
60.0000 mg | PREFILLED_SYRINGE | SUBCUTANEOUS | Status: AC
  Administered 2024-04-22: 60 mg via SUBCUTANEOUS

## 2024-01-10 NOTE — Addendum Note (Signed)
 Addended by: DIONISIO CAMELIA PARAS on: 01/10/2024 06:56 PM   Modules accepted: Orders

## 2024-01-10 NOTE — Progress Notes (Signed)
 Pt on bone density report. Order placed for PA.

## 2024-01-18 LAB — HM MAMMOGRAPHY

## 2024-01-18 LAB — HM DEXA SCAN

## 2024-01-19 ENCOUNTER — Encounter: Payer: Self-pay | Admitting: Internal Medicine

## 2024-01-22 ENCOUNTER — Ambulatory Visit: Payer: Medicare Other

## 2024-01-22 VITALS — BP 122/62 | HR 62 | Temp 98.3°F | Ht 65.0 in | Wt 154.1 lb

## 2024-01-22 DIAGNOSIS — Z0000 Encounter for general adult medical examination without abnormal findings: Secondary | ICD-10-CM | POA: Diagnosis not present

## 2024-01-22 NOTE — Progress Notes (Signed)
 Subjective:   Belinda Harper is a 83 y.o. who presents for a Medicare Wellness preventive visit.  As a reminder, Annual Wellness Visits don't include a physical exam, and some assessments may be limited, especially if this visit is performed virtually. We may recommend an in-person follow-up visit with your provider if needed.  Visit Complete: In person    Persons Participating in Visit: Patient.  AWV Questionnaire: Yes: Patient Medicare AWV questionnaire was completed by the patient on 01/14/25; I have confirmed that all information answered by patient is correct and no changes since this date.  Cardiac Risk Factors include: advanced age (>67men, >50 women)     Objective:    Today's Vitals   01/22/24 1421  BP: 122/62  Pulse: 62  Temp: 98.3 F (36.8 C)  TempSrc: Oral  SpO2: 98%  Weight: 154 lb 1.6 oz (69.9 kg)  Height: 5' 5 (1.651 m)   Body mass index is 25.64 kg/m.     01/22/2024    2:44 PM 01/18/2023    3:33 PM 11/08/2021   11:48 AM 10/15/2021    7:10 AM 08/09/2021    9:31 AM 06/25/2021   10:26 AM 05/17/2021    3:06 PM  Advanced Directives  Does Patient Have a Medical Advance Directive? No No No No No No No  Would patient like information on creating a medical advance directive? No - Patient declined No - Patient declined No - Patient declined No - Patient declined No - Patient declined No - Patient declined No - Patient declined    Current Medications (verified) Outpatient Encounter Medications as of 01/22/2024  Medication Sig   amLODipine  (NORVASC ) 5 MG tablet Take 1 tablet (5 mg total) by mouth daily.   Calcium Citrate-Vitamin D  (CITRACAL + D PO) Take 2 tablets by mouth in the morning and at bedtime. Take 2 tablets po a.m and at lunch. 800 mg calcium   cyanocobalamin  (VITAMIN B12) 1000 MCG tablet Take 500 mcg by mouth daily.   DARBEPOETIN ALFA  IJ Inject as directed as needed.   denosumab  (PROLIA ) 60 MG/ML SOSY injection Inject 60 mg into the skin every 6 (six)  months.   docusate sodium  (COLACE) 100 MG capsule Take 200 mg by mouth every morning.   estradiol  (ESTRACE ) 0.1 MG/GM vaginal cream Insert 1 gm vaginally as directed twice a week.   Magnesium Glycinate 665 MG CAPS Take 200 mg by mouth daily.   Menaquinone-7 (K2 PO) Take by mouth.   metoprolol  succinate (TOPROL -XL) 25 MG 24 hr tablet TAKE 1/2 TABLET (12.5 MG TOTAL) BY MOUTH AT BEDTIME *SCHEDULE APPOINTMENT FOR FURTHER REFILLS*   Wheat Dextrin (BENEFIBER DRINK MIX PO) Take 1 Scoop by mouth daily.   Facility-Administered Encounter Medications as of 01/22/2024  Medication   [START ON 04/22/2024] denosumab  (PROLIA ) injection 60 mg    Allergies (verified) Sulfamethoxazole, Venofer  [iron  sucrose], Aspirin, Nsaids, Sulfa antibiotics, and Yellow jacket venom   History: Past Medical History:  Diagnosis Date   Allergic rhinitis    Anxiety    Bradycardia    Chronic constipation    Chronic gastritis    followed by dr shila (GI);  long hx erosive gastritis with bleed   Depression    Family history of adverse reaction to anesthesia    pt's mother as she gets older organs shut down    GERD (gastroesophageal reflux disease)    Heart palpitations    cardiologist-- dr mona;   palpitations w/ intermittant bradycardia;  event monitor 04-01-2020 multiple  SVT episodes   Hemorrhoids    s/p banding 05-07-2021 by dr shila   History of asthma    childhood   History of sepsis 02/05/2015   sepsis secondart to cellulitis , left leg/ ankle due to retained ankle hardware   Hyperlipidemia    Hypertension    followed by pcp and cardiology;   normal nuclear stress test 09-28-2012   Iron  deficiency anemia secondary to blood loss (chronic)    oncologist--- dr lonn;  long hx anemia secondary gi blood loss;  treated with transfusion's and IV iron    Leukopenia    chronic intermittantly --- followed by dr lonn   OA (osteoarthritis)    Osteoporosis    Presence of pessary    PSVT (paroxysmal  supraventricular tachycardia)    Rectal bleeding    Uterine prolapse    per pt uses pessary   Wears glasses    Past Surgical History:  Procedure Laterality Date   CATARACT EXTRACTION W/ INTRAOCULAR LENS IMPLANT Bilateral    right 2000;  left 2020   COLONOSCOPY WITH ESOPHAGOGASTRODUODENOSCOPY (EGD)  12/15/2020   by dr shila   HARDWARE REMOVAL Left 02/21/2015   Procedure: IRRIGATION AND DEBRIDEMENT HARDWARE REMOVAL LEFT ANKLE ;  Surgeon: Dempsey Moan, MD;  Location: [facility];  Service: Orthopedics;  Laterality: Left;   MASS EXCISION Left 09/11/2012   Procedure: 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; Rotation Flap;  Surgeon: Arley JONELLE Curia, MD;  Location: [facility];  Service: Orthopedics;  Laterality: Left;   ORIF ANKLE FRACTURE Left 03/21/2009   @[facility]   by dr moan;   fracture and dislocation   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 , saw injury   TRANSANAL HEMORRHOIDAL DEARTERIALIZATION N/A 10/15/2021   Procedure: TRANSANAL HEMORRHOIDAL DEARTERIALIZATION;  Surgeon: Debby Hila, MD;  Location: [facility];  Service: General;  Laterality: N/A;   Family History  Problem Relation Age of Onset   Hyperlipidemia Mother    Heart disease Mother        CABG age 55   Ovarian cancer Mother    Uterine cancer Mother    Arthritis Mother    Osteoporosis Mother    Emphysema Mother    Heart disease Father        CABG age 37   Arthritis Father    Melanoma Father    Bladder Cancer Neg Hx    Renal cancer Neg Hx    Social History   Socioeconomic History   Marital status: Divorced    Spouse name: Not on file   Number of children: 2   Years of education: Not on file   Highest education level: Some college, no degree  Occupational History   Occupation: retired  Tobacco Use   Smoking status: Never    Passive exposure: Past   Smokeless tobacco: Never  Vaping Use   Vaping status: Never Used  Substance and Sexual  Activity   Alcohol use: Not Currently   Drug use: No   Sexual activity: Not Currently    Partners: Male    Birth control/protection: Post-menopausal    Comment: 1st intercourse- 18, partners- 1   Other Topics Concern   Not on file  Social History Narrative   HH of  1   To rent out room    Retired Artist   Never smoked    Pet cat allergic    Divorced   Regular exericise  walking mowing the lawnswimming  Silver sneakers    Social Drivers of Corporate investment banker Strain: Low Risk  (01/22/2024)   Overall Financial Resource Strain (CARDIA)    Difficulty of Paying Living Expenses: Not hard at all  Food Insecurity: No Food Insecurity (01/22/2024)   Hunger Vital Sign    Worried About Running Out of Food in the Last Year: Never true    Ran Out of Food in the Last Year: Never true  Transportation Needs: No Transportation Needs (01/22/2024)   PRAPARE - Administrator, Civil Service (Medical): No    Lack of Transportation (Non-Medical): No  Physical Activity: Insufficiently Active (01/22/2024)   Exercise Vital Sign    Days of Exercise per Week: 2 days    Minutes of Exercise per Session: 20 min  Stress: No Stress Concern Present (01/22/2024)   [employer] of Occupational Health - Occupational Stress Questionnaire    Feeling of Stress: Only a little  Social Connections: Socially Isolated (01/22/2024)   Social Connection and Isolation Panel    Frequency of Communication with Friends and Family: More than three times a week    Frequency of Social Gatherings with Friends and Family: More than three times a week    Attends Religious Services: Never    Database administrator or Organizations: No    Attends Engineer, structural: Not on file    Marital Status: Divorced    Tobacco Counseling Counseling given: Not Answered    Clinical Intake:  Pre-visit preparation completed: Yes  Pain : No/denies pain     BMI - recorded: 25.64 Nutritional  Status: BMI 25 -29 Overweight Nutritional Risks: None Diabetes: No  Lab Results  Component Value Date   HGBA1C 5.4 03/15/2023   HGBA1C 5.6 11/21/2018     How often do you need to have someone help you when you read instructions, pamphlets, or other written materials from your doctor or pharmacy?: 1 - Never  Interpreter Needed?: No  Information entered by :: Rojelio Blush LPN   Activities of Daily Living     01/22/2024    2:19 PM 01/15/2024    7:42 PM  In your present state of health, do you have any difficulty performing the following activities:  Hearing? 0 0  Vision? 0 0  Difficulty concentrating or making decisions? 1 1  Comment Pending Medical attention   Walking or climbing stairs? 0 0  Dressing or bathing? 0 0  Doing errands, shopping? 0 0  Preparing Food and eating ? N N  Using the Toilet? N N  In the past six months, have you accidently leaked urine? CINDERELLA CINDERELLA  Comment Wears Pads. Followed by medical attention   Do you have problems with loss of bowel control? Y Y  Comment Followed by medical attention   Managing your Medications? N N  Managing your Finances? N N  Housekeeping or managing your Housekeeping? N N    Patient Care Team: Panosh, Apolinar POUR, MD as PCP - General (Internal Medicine) Mona Vinie BROCKS, MD as PCP - Cardiology (Cardiology) Octavia Charleston, MD (Ophthalmology) Rea Charleston, DDS (Dentistry) Ivin Kocher, MD as Consulting Physician (Dermatology) Lonn Hicks, MD as Consulting Physician (Hematology and Oncology) Melodi Lerner, MD as Consulting Physician (Orthopedic Surgery) Mannam, Praveen, MD as Consulting Physician (Pulmonary Disease)  I have updated your Care Teams any recent Medical Services you may have received from other providers in the past year.     Assessment:   This is a routine wellness  examination for Heavyn.  Hearing/Vision screen Hearing Screening - Comments:: Denies hearing difficulties   Vision Screening - Comments:: Wears  rx glasses - up to date with routine eye exams with [facility]   Goals Addressed               This Visit's Progress     Increase physical activity (pt-stated)        Remain active       Depression Screen     01/22/2024    2:18 PM 01/18/2023    3:22 PM 03/08/2022    2:46 PM 03/02/2022   10:43 AM 02/23/2021    3:22 PM 06/10/2020    9:58 AM 12/05/2019    9:27 AM  PHQ 2/9 Scores  PHQ - 2 Score 0 0 0 0 0 0 0  PHQ- 9 Score   0 1   0    Fall Risk     01/22/2024    2:20 PM 01/15/2024    7:42 PM 10/11/2023    3:54 PM 07/11/2023    3:23 PM 01/14/2023    9:56 AM  Fall Risk   Falls in the past year? 1 1 1 1 1   Number falls in past yr: 0 1 0 1 0  Injury with Fall? 0 0 0 0 0  Risk for fall due to : No Fall Risks  Impaired mobility    Follow up Falls evaluation completed    Falls prevention discussed    MEDICARE RISK AT HOME:  Medicare Risk at Home Any stairs in or around the home?: Yes If so, are there any without handrails?: Yes Home free of loose throw rugs in walkways, pet beds, electrical cords, etc?: Yes Adequate lighting in your home to reduce risk of falls?: Yes Life alert?: No Use of a cane, walker or w/c?: No Grab bars in the bathroom?: Yes Shower chair or bench in shower?: No Elevated toilet seat or a handicapped toilet?: Yes  TIMED UP AND GO:  Was the test performed?  Yes  Length of time to ambulate 10 feet: 10 sec Gait steady and fast without use of assistive device  Cognitive Function: 6CIT completed    10/20/2017   10:00 AM  MMSE - Mini Mental State Exam  Orientation to time 5  Orientation to Place 5  Registration 3  Attention/ Calculation 5  Recall 3  Language- name 2 objects 2  Language- repeat 1  Language- follow 3 step command 3  Language- read & follow direction 1  Write a sentence 1  Copy design 1  Total score 30        01/22/2024    2:44 PM 01/18/2023    3:33 PM 02/23/2021    3:34 PM 12/05/2019    9:33 AM  6CIT Screen  What Year? 0  points 0 points 0 points 0 points  What month? 0 points 0 points 0 points 0 points  What time? 0 points 0 points 0 points 0 points  Count back from 20 0 points 0 points 0 points 0 points  Months in reverse 0 points 0 points 0 points 0 points  Repeat phrase 4 points 0 points 2 points 2 points  Total Score 4 points 0 points 2 points 2 points    Immunizations Immunization History  Administered Date(s) Administered   Fluad Quad(high Dose 65+) 03/02/2021, 03/02/2022   INFLUENZA, HIGH DOSE SEASONAL PF 03/23/2016, 04/24/2017, 02/09/2018, 03/19/2019   Influenza Whole 03/10/2009, 01/27/2010   Influenza,inj,Quad  PF,6+ Mos 04/08/2020   Influenza-Unspecified 03/20/2023   PFIZER(Purple Top)SARS-COV-2 Vaccination 02/12/2020, 03/04/2020   Pneumococcal Conjugate-13 12/18/2013   Pneumococcal Polysaccharide-23 04/25/2006, 10/18/2012   Td 04/25/1997, 06/22/2009   Tdap 03/20/2023   Zoster Recombinant(Shingrix) 11/01/2017, 01/04/2018   Zoster, Live 06/28/2010    Screening Tests Health Maintenance  Topic Date Due   COVID-19 Vaccine (3 - Pfizer risk series) 04/01/2020   Influenza Vaccine  11/24/2023   Medicare Annual Wellness (AWV)  01/21/2025   DTaP/Tdap/Td (4 - Td or Tdap) 03/19/2033   Pneumococcal Vaccine: 50+ Years  Completed   DEXA SCAN  Completed   Zoster Vaccines- Shingrix  Completed   HPV VACCINES  Aged Out   Meningococcal B Vaccine  Aged Out    Health Maintenance Items Addressed:   Additional Screening:  Vision Screening: Recommended annual ophthalmology exams for early detection of glaucoma and other disorders of the eye. Is the patient up to date with their annual eye exam?  Yes  Who is the provider or what is the name of the office in which the patient attends annual eye exams? [facility]  Dental Screening: Recommended annual dental exams for proper oral hygiene  Community Resource Referral / Chronic Care Management: CRR required this visit?  No   CCM required this  visit?  No   Plan:    I have personally reviewed and noted the following in the patient's chart:   Medical and social history Use of alcohol, tobacco or illicit drugs  Current medications and supplements including opioid prescriptions. Patient is not currently taking opioid prescriptions. Functional ability and status Nutritional status Physical activity Advanced directives List of other physicians Hospitalizations, surgeries, and ER visits in previous 12 months Vitals Screenings to include cognitive, depression, and falls Referrals and appointments  In addition, I have reviewed and discussed with patient certain preventive protocols, quality metrics, and best practice recommendations. A written personalized care plan for preventive services as well as general preventive health recommendations were provided to patient.   Rojelio LELON Blush, LPN   [id number]   After Visit Summary: (In Person-Printed) AVS printed and given to the patient  Notes: Nothing significant to report at this time.

## 2024-01-22 NOTE — Patient Instructions (Addendum)
 [name],  Thank you for taking the time for your Medicare Wellness Visit. I appreciate your continued commitment to your health goals. Please review the care plan we discussed, and feel free to reach out if I can assist you further.  Medicare recommends these wellness visits once per year to help you and your care team stay ahead of potential health issues. These visits are designed to focus on prevention, allowing your provider to concentrate on managing your acute and chronic conditions during your regular appointments.  Please note that Annual Wellness Visits do not include a physical exam. Some assessments may be limited, especially if the visit was conducted virtually. If needed, we may recommend a separate in-person follow-up with your provider.  Ongoing Care Seeing your primary care provider every 3 to 6 months helps us  monitor your health and provide consistent, personalized care.    Referrals If a referral was made during today's visit and you haven't received any updates within two weeks, please contact the referred provider directly to check on the status.  Recommended Screenings:  Health Maintenance  Topic Date Due   COVID-19 Vaccine (3 - Pfizer risk series) 04/01/2020   Flu Shot  11/24/2023   Medicare Annual Wellness Visit  01/21/2025   DTaP/Tdap/Td vaccine (4 - Td or Tdap) 03/19/2033   Pneumococcal Vaccine for age over 35  Completed   DEXA scan (bone density measurement)  Completed   Zoster (Shingles) Vaccine  Completed   HPV Vaccine  Aged Out   Meningitis B Vaccine  Aged Out       01/22/2024    2:44 PM  Advanced Directives  Does Patient Have a Medical Advance Directive? No  Would patient like information on creating a medical advance directive? No - Patient declined   Advance Care Planning is important because it: Ensures you receive medical care that aligns with your values, goals, and preferences. Provides guidance to your family and loved ones, reducing the  emotional burden of decision-making during critical moments.  Vision: Annual vision screenings are recommended for early detection of glaucoma, cataracts, and diabetic retinopathy. These exams can also reveal signs of chronic conditions such as diabetes and high blood pressure.  Dental: Annual dental screenings help detect early signs of oral cancer, gum disease, and other conditions linked to overall health, including heart disease and diabetes.  Please see the attached documents for additional preventive care recommendations.

## 2024-02-05 ENCOUNTER — Ambulatory Visit: Admitting: Obstetrics and Gynecology

## 2024-02-05 ENCOUNTER — Other Ambulatory Visit (HOSPITAL_COMMUNITY)
Admission: RE | Admit: 2024-02-05 | Discharge: 2024-02-05 | Disposition: A | Discharge status: Home / Self Care | Source: Other Acute Inpatient Hospital | Attending: Obstetrics and Gynecology | Admitting: Obstetrics and Gynecology

## 2024-02-05 ENCOUNTER — Encounter: Payer: Self-pay | Admitting: Obstetrics and Gynecology

## 2024-02-05 VITALS — BP 169/77 | HR 60

## 2024-02-05 DIAGNOSIS — R82998 Other abnormal findings in urine: Secondary | ICD-10-CM

## 2024-02-05 DIAGNOSIS — N813 Complete uterovaginal prolapse: Secondary | ICD-10-CM

## 2024-02-05 DIAGNOSIS — N368 Other specified disorders of urethra: Secondary | ICD-10-CM

## 2024-02-05 DIAGNOSIS — Z960 Presence of urogenital implants: Secondary | ICD-10-CM

## 2024-02-05 DIAGNOSIS — R8290 Unspecified abnormal findings in urine: Secondary | ICD-10-CM

## 2024-02-05 DIAGNOSIS — N814 Uterovaginal prolapse, unspecified: Secondary | ICD-10-CM

## 2024-02-05 DIAGNOSIS — R319 Hematuria, unspecified: Secondary | ICD-10-CM

## 2024-02-05 LAB — POCT URINALYSIS DIP (CLINITEK)
Bilirubin, UA: NEGATIVE
Glucose, UA: NEGATIVE mg/dL
Ketones, POC UA: NEGATIVE mg/dL
Nitrite, UA: POSITIVE — AB
POC PROTEIN,UA: 30 — AB
Spec Grav, UA: 1.02 (ref 1.010–1.025)
Urobilinogen, UA: 0.2 U/dL
pH, UA: 7 (ref 5.0–8.0)

## 2024-02-05 LAB — URINALYSIS, COMPLETE (UACMP) WITH MICROSCOPIC
Bilirubin Urine: NEGATIVE
Glucose, UA: NEGATIVE mg/dL
Ketones, ur: NEGATIVE mg/dL
Nitrite: POSITIVE — AB
Protein, ur: 30 mg/dL — AB
Specific Gravity, Urine: 1.019 (ref 1.005–1.030)
WBC, UA: >50 WBC/hpf (ref 0–5)
pH: 7 (ref 5.0–8.0)

## 2024-02-05 NOTE — Progress Notes (Signed)
 [facility]   Subjective:     Chief Complaint:  Chief Complaint  Patient presents with   Pessary Check    Belinda Harper is a 83 y.o. female is here for pessary check.   History of Present Illness: Belinda Harper is a 83 y.o. female with stage IV pelvic organ prolapse who presents for a pessary check. She is using a size #4 long stem gellhorn pessary. The pessary has been working well and she has no complaints. She is using vaginal estrogen. She denies vaginal bleeding.  Past Medical History: Patient  has a past medical history of Allergic rhinitis, Anxiety, Bradycardia, Chronic constipation, Chronic gastritis, Depression, Family history of adverse reaction to anesthesia, GERD (gastroesophageal reflux disease), Heart palpitations, Hemorrhoids, History of asthma, History of sepsis (02/05/2015), Hyperlipidemia, Hypertension, Iron  deficiency anemia secondary to blood loss (chronic), Leukopenia, OA (osteoarthritis), Osteoporosis, Presence of pessary, PSVT (paroxysmal supraventricular tachycardia), Rectal bleeding, Uterine prolapse, and Wears glasses.   Past Surgical History: She  has a past surgical history that includes ORIF ankle fracture (Left, 03/21/2009); Cataract extraction w/ intraocular lens implant (Bilateral); Revision amputation of finger (1977); Mass excision (Left, 09/11/2012); Hardware Removal (Left, 02/21/2015); Colonoscopy with esophagogastroduodenoscopy (egd) (12/15/2020); and Transanal hemorrhoidal dearterialization (N/A, 10/15/2021).   Medications: She has a current medication list which includes the following prescription(s): amlodipine , calcium citrate-vitamin d , cyanocobalamin , darbepoetin alfa , denosumab , docusate sodium , estradiol , magnesium glycinate, menaquinone-7, metoprolol  succinate, and wheat dextrin, and the following Facility-Administered Medications: [START ON 04/22/2024] denosumab .   Allergies: Patient is allergic to sulfamethoxazole, venofer  [iron   sucrose], aspirin, nsaids, sulfa antibiotics, and yellow jacket venom.   Social History: Patient  reports that she has never smoked. She has been exposed to tobacco smoke. She has never used smokeless tobacco. She reports that she does not currently use alcohol. She reports that she does not use drugs.      Objective:    Physical Exam: BP (!) 169/77   Pulse 60  Gen: No apparent distress, A&O x 3. Detailed Urogynecologic Evaluation:  Pelvic Exam: Noted external hemorrhoids on exam.  Normal external female genitalia; Bartholin's and Skene's glands normal in appearance; urethral meatus prolapsed but has improved since last check, no urethral masses or discharge. The pessary was noted to be in place. It was removed and cleaned. Speculum exam revealed no lesions in the vagina. The pessary was replaced. It was comfortable to the patient and fit well.     Laboratory Results: Lab Results  Component Value Date   COLORU yellow 02/05/2024   CLARITYU cloudy (A) 02/05/2024   GLUCOSEUR negative 02/05/2024   BILIRUBINUR negative 02/05/2024   KETONESU n 08/10/2016   SPECGRAV 1.020 02/05/2024   RBCUR small (A) 02/05/2024   PHUR 7.0 02/05/2024   PROTEINUR 30 (A) 11/02/2023   UROBILINOGEN 0.2 02/05/2024   LEUKOCYTESUR Moderate (2+) (A) 02/05/2024     Assessment/Plan:    Assessment: Ms. Elizardo is a 83 y.o. with stage IV pelvic organ prolapse here for a pessary check. She is doing well.  Plan: She will keep the pessary in place until next visit. She will continue to use estrogen. She will follow-up in 3 months for a pessary check or sooner as needed.   Patient's urine is concerning for UTI, but she is completely asymptomatic. Question colonization. Did not prescribe antibiotics due to asymptomatic state. Will see what culture shows and can discuss with patient when results are in.   All questions were answered.

## 2024-02-07 ENCOUNTER — Ambulatory Visit: Payer: Self-pay | Admitting: Obstetrics and Gynecology

## 2024-02-07 LAB — URINE CULTURE: Culture: >=100000 — AB

## 2024-02-16 ENCOUNTER — Other Ambulatory Visit: Payer: Self-pay | Admitting: Family

## 2024-02-16 DIAGNOSIS — I10 Essential (primary) hypertension: Secondary | ICD-10-CM

## 2024-02-21 ENCOUNTER — Other Ambulatory Visit: Payer: Self-pay | Admitting: Internal Medicine

## 2024-02-21 DIAGNOSIS — I10 Essential (primary) hypertension: Secondary | ICD-10-CM

## 2024-02-21 NOTE — Telephone Encounter (Unsigned)
 Copied from CRM [id number]. Topic: Clinical - Medication Refill >> Feb 21, 2024  9:37 AM [name] wrote: Medication:  amLODipine  (NORVASC ) 5 MG tablet   Has the patient contacted their pharmacy? Yes (Agent: If no, request that the patient contact the pharmacy for the refill. If patient does not wish to contact the pharmacy document the reason why and proceed with request.) (Agent: If yes, when and what did the pharmacy advise?)  This is the patient's preferred pharmacy:  [facility] - [place], [place] - [address] [place] [address] Phone: [phone] Fax: [phone]  Is this the correct pharmacy for this prescription? Yes If no, delete pharmacy and type the correct one.   Has the prescription been filled recently? Yes  Is the patient out of the medication? Yes  Has the patient been seen for an appointment in the last year OR does the patient have an upcoming appointment? Yes  Can we respond through MyChart? Yes  Agent: Please be advised that Rx refills may take up to 3 business days. We ask that you follow-up with your pharmacy.

## 2024-02-22 ENCOUNTER — Inpatient Hospital Stay: Attending: Hematology and Oncology

## 2024-02-22 DIAGNOSIS — D649 Anemia, unspecified: Secondary | ICD-10-CM

## 2024-02-22 DIAGNOSIS — D500 Iron deficiency anemia secondary to blood loss (chronic): Secondary | ICD-10-CM

## 2024-02-22 DIAGNOSIS — D509 Iron deficiency anemia, unspecified: Secondary | ICD-10-CM | POA: Insufficient documentation

## 2024-02-22 LAB — BASIC METABOLIC PANEL - CANCER CENTER ONLY
Anion gap: 7 (ref 5–15)
BUN: 26 mg/dL — ABNORMAL HIGH (ref 8–23)
CO2: 30 mmol/L (ref 22–32)
Calcium: 9.8 mg/dL (ref 8.9–10.3)
Chloride: 104 mmol/L (ref 98–111)
Creatinine: 0.93 mg/dL (ref 0.44–1.00)
GFR, Estimated: >60 mL/min (ref >60)
Glucose, Bld: 71 mg/dL (ref 70–99)
Potassium: 3.8 mmol/L (ref 3.5–5.1)
Sodium: 141 mmol/L (ref 135–145)

## 2024-02-22 LAB — CBC WITH DIFFERENTIAL/PLATELET
Abs Immature Granulocytes: 0 K/uL (ref 0.00–0.07)
Basophils Absolute: 0.1 K/uL (ref 0.0–0.1)
Basophils Relative: 1 %
Eosinophils Absolute: 0.2 K/uL (ref 0.0–0.5)
Eosinophils Relative: 6 %
HCT: 34.6 % — ABNORMAL LOW (ref 36.0–46.0)
Hemoglobin: 10.9 g/dL — ABNORMAL LOW (ref 12.0–15.0)
Immature Granulocytes: 0 %
Lymphocytes Relative: 40 %
Lymphs Abs: 1.5 K/uL (ref 0.7–4.0)
MCH: 26.3 pg (ref 26.0–34.0)
MCHC: 31.5 g/dL (ref 30.0–36.0)
MCV: 83.6 fL (ref 80.0–100.0)
Monocytes Absolute: 0.4 K/uL (ref 0.1–1.0)
Monocytes Relative: 11 %
Neutro Abs: 1.5 K/uL — ABNORMAL LOW (ref 1.7–7.7)
Neutrophils Relative %: 42 %
Platelets: 203 K/uL (ref 150–400)
RBC: 4.14 MIL/uL (ref 3.87–5.11)
RDW: 19.2 % — ABNORMAL HIGH (ref 11.5–15.5)
WBC: 3.6 K/uL — ABNORMAL LOW (ref 4.0–10.5)
nRBC: 0 % (ref 0.0–0.2)

## 2024-02-22 LAB — IRON AND IRON BINDING CAPACITY (CC-WL,HP ONLY)
Iron: 34 ug/dL (ref 28–170)
Saturation Ratios: 9 % — ABNORMAL LOW (ref 10.4–31.8)
TIBC: 364 ug/dL (ref 250–450)
UIBC: 330 ug/dL (ref 148–442)

## 2024-02-22 LAB — FERRITIN: Ferritin: 33 ng/mL (ref 11–307)

## 2024-02-22 MED ORDER — AMLODIPINE BESYLATE 5 MG PO TABS: 5.0000 mg | ORAL_TABLET | Freq: Every day | ORAL | 3 refills | Status: DC

## 2024-02-29 ENCOUNTER — Encounter: Payer: Self-pay | Admitting: Hematology and Oncology

## 2024-02-29 ENCOUNTER — Inpatient Hospital Stay: Attending: Hematology and Oncology | Admitting: Hematology and Oncology

## 2024-02-29 VITALS — BP 149/57 | HR 60 | Temp 97.7°F | Resp 18 | Ht 65.0 in | Wt 156.2 lb

## 2024-02-29 DIAGNOSIS — D61818 Other pancytopenia: Secondary | ICD-10-CM | POA: Diagnosis not present

## 2024-02-29 DIAGNOSIS — Z79899 Other long term (current) drug therapy: Secondary | ICD-10-CM | POA: Insufficient documentation

## 2024-02-29 DIAGNOSIS — D72819 Decreased white blood cell count, unspecified: Secondary | ICD-10-CM

## 2024-02-29 DIAGNOSIS — D500 Iron deficiency anemia secondary to blood loss (chronic): Secondary | ICD-10-CM | POA: Diagnosis present

## 2024-02-29 DIAGNOSIS — D649 Anemia, unspecified: Secondary | ICD-10-CM

## 2024-02-29 NOTE — Assessment & Plan Note (Addendum)
 She has multifactorial anemia She has chronic intermittent GI bleed Since last time I saw her, she has no further bleeding Her hemoglobin and iron  studies are trending down The most likely cause of her anemia is due to chronic blood loss/malabsorption syndrome. We discussed some of the risks, benefits, and alternatives of intravenous iron  infusions. The patient is symptomatic from anemia and the iron  level is critically low. She tolerated oral iron  supplement poorly and desires to achieved higher levels of iron  faster for adequate hematopoesis. Some of the side-effects to be expected including risks of infusion reactions, phlebitis, headaches, nausea and fatigue.  The patient is willing to proceed. Patient education material was dispensed.  Goal is to keep ferritin level greater than 50 and resolution of anemia She has declined treatment at [facility]. We discussed limitation to treat her here and she agrees to be treated on Saturday morning

## 2024-02-29 NOTE — Assessment & Plan Note (Addendum)
 She has chronic intermittent leukopenia This has been present since 2007, unknown etiology Recent repeat B12 level in June 2025 was adequate She is not symptomatic Observe for now

## 2024-02-29 NOTE — Progress Notes (Signed)
 Belinda Harper  Panosh, Apolinar POUR, MD  ASSESSMENT & PLAN:  Assessment & Plan Symptomatic anemia She has multifactorial anemia She has chronic intermittent GI bleed Since last time I saw her, she has no further bleeding Her hemoglobin and iron  studies are trending down The most likely cause of her anemia is due to chronic blood loss/malabsorption syndrome. We discussed some of the risks, benefits, and alternatives of intravenous iron  infusions. The patient is symptomatic from anemia and the iron  level is critically low. She tolerated oral iron  supplement poorly and desires to achieved higher levels of iron  faster for adequate hematopoesis. Some of the side-effects to be expected including risks of infusion reactions, phlebitis, headaches, nausea and fatigue.  The patient is willing to proceed. Patient education material was dispensed.  Goal is to keep ferritin level greater than 50 and resolution of anemia She has declined treatment at [facility]. We discussed limitation to treat her here and she agrees to be treated on Saturday morning  Leukopenia, unspecified type She has chronic intermittent leukopenia This has been present since 2007, unknown etiology Recent repeat B12 level in June 2025 was adequate She is not symptomatic Observe for now    No orders of the defined types were placed in this encounter.   INTERVAL HISTORY: Patient returns for recurrent anemia Symptoms of anemia includes dyspnea, fatigue, and pallor We reviewed CBC and iron  studies  SUMMARY OF HEMATOLOGIC HISTORY:  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 Summary of her history is as follows: This is a pleasant woman with multifactorial anemia and associated leukopenia. She has a clear element of iron   malabsorption with a superimposed normochromic anemia and chronic leukopenia. The chronic anemia and leukopenia go back as far as we have records (2007) when white counts ran as low as 2700. She has a normal differential. White count average is 3000 and has not changed. Platelet count is normal. Best hemoglobin she achieves with parenteral iron  replacement is 11 g. She had a nondiagnostic bone marrow biopsy done in 2006. She had received intravenous iron  infusion in the past. Her last colonoscopy in May 2013 showed severe diverticulosis with internal hemorrhoids. She feels well. Denies recent infection. She denies symptoms of fatigue. She tolerated iron  supplement well. She had periodic hemorrhoidal bleeding. She was found to have abnormal CBC from intermittently over the years by her primary care doctor She has started taking oral iron  supplements since November of last year Most recently, she have noted passage of dark stool She attempted to increase her oral iron  supplement but still complain of excessive fatigue Recently, her repeat CBC has dropped from 11-8.2 and hence she was referred back here for further evaluation On 12/15/20, she had repeat EGD and colonoscopy which showed: - Severe diverticulosis in the sigmoid colon, in the descending colon, in the transverse colon and in the ascending colon. - Non-bleeding external and internal hemorrhoids. - No specimens collected.  She denies recent chest pain on exertion, shortness of breath on minimal exertion, pre-syncopal episodes, or palpitations.  The patient denies over the counter NSAID ingestion. She is not on antiplatelets agents. Her last colonoscopy was in 2013 She had no prior history or diagnosis of cancer. Her age appropriate screening programs are up-to-date. She denies any pica and eats a variety of  diet.  The patient was prescribed oral iron  supplements and she takes 1 dose of oral iron  supplement at night to avoid  constipation From Sept 2021 till present, she has received many doses of intravenous iron  infusion On December 15, 2020, she had repeat upper endoscopy evaluation due to severe anemia which showed LA Grade B reflux esophagitis with no bleeding. 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 - Gastritis. Biopsied. - Normal examined duodenum.  Since Monoferric  IV iron  in October 2022, her iron  deficiency resolved On May 28, 2021, she started on Aranesp  injection  On 06/18/21, she had repeat GI procedure PROCEDURE Harper: The patient presents with symptomatic grade II-III  hemorrhoids, requesting rubber band ligation of his/her hemorrhoidal disease.  All risks, benefits and alternative forms of therapy were described and informed consent was obtained.   In the Left Lateral Decubitus position anoscopic examination revealed grade II-III hemorrhoids in the left lateral and right posterior position(s).  The anorectum was pre-medicated with 0.125% Nitroglycerine and Recticare The decision was made to band the left lateral internal hemorrhoid, and the [facility] O'Regan System was used to perform band ligation without complication.  Digital anorectal examination was then performed to assure proper positioning of the band, and to adjust the banded tissue as required. The patient was discharged home without pain or other issues.  Dietary and behavioral recommendations were given and along with follow-up instructions.    In March 2023, she received both blood transfusion and intravenous iron  infusion On October 15, 2021, she underwent ligation and resection of hemorrhoid On 11/09/2011, she received another unit of blood due to severe rectal bleeding On 11/29/2021, she received 1 dose of darbepoetin injection In 2024, she received intravenous iron  sucrose and 2 doses of darbepoetin injection In 2025, she received 3 doses of intravenous iron   Lab Results  Component  Value Date   VITAMINB12 944 (H) 10/12/2023   FERRITIN 33 02/22/2024   HGB 10.9 (L) 02/22/2024   RBC 4.14 02/22/2024   Vitals:   02/29/24 0914  BP: (!) 149/57  Pulse: 60  Resp: 18  Temp: 97.7 F (36.5 C)  SpO2: 97%

## 2024-03-04 ENCOUNTER — Other Ambulatory Visit (HOSPITAL_COMMUNITY): Payer: Self-pay

## 2024-03-08 MED FILL — Ferumoxytol Inj 510 MG/17ML (30 MG/ML) (Elemental Fe): INTRAVENOUS | Qty: 17 | Status: AC

## 2024-03-09 ENCOUNTER — Inpatient Hospital Stay

## 2024-03-09 VITALS — BP 137/60 | HR 52 | Temp 97.0°F | Resp 18

## 2024-03-09 DIAGNOSIS — D500 Iron deficiency anemia secondary to blood loss (chronic): Secondary | ICD-10-CM

## 2024-03-09 DIAGNOSIS — D539 Nutritional anemia, unspecified: Secondary | ICD-10-CM

## 2024-03-09 MED ORDER — CETIRIZINE HCL 10 MG PO TABS
10.0000 mg | ORAL_TABLET | Freq: Once | ORAL | Status: AC
  Administered 2024-03-09: 10 mg via ORAL
  Filled 2024-03-09: qty 1

## 2024-03-09 MED ORDER — ACETAMINOPHEN 325 MG PO TABS
650.0000 mg | ORAL_TABLET | Freq: Once | ORAL | Status: AC
  Administered 2024-03-09: 650 mg via ORAL
  Filled 2024-03-09: qty 2

## 2024-03-09 MED ORDER — SODIUM CHLORIDE 0.9 % IV SOLN
510.0000 mg | Freq: Once | INTRAVENOUS | Status: AC
  Administered 2024-03-09: 510 mg via INTRAVENOUS
  Filled 2024-03-09: qty 17

## 2024-03-09 MED ORDER — SODIUM CHLORIDE 0.9 % IV SOLN: INTRAVENOUS | Status: DC

## 2024-03-09 NOTE — Patient Instructions (Signed)

## 2024-03-15 MED FILL — Ferumoxytol Inj 510 MG/17ML (30 MG/ML) (Elemental Fe): INTRAVENOUS | Qty: 17 | Status: AC

## 2024-03-16 ENCOUNTER — Inpatient Hospital Stay

## 2024-03-16 VITALS — BP 115/55 | HR 61 | Temp 97.7°F | Resp 18

## 2024-03-16 DIAGNOSIS — D500 Iron deficiency anemia secondary to blood loss (chronic): Secondary | ICD-10-CM

## 2024-03-16 DIAGNOSIS — D539 Nutritional anemia, unspecified: Secondary | ICD-10-CM

## 2024-03-16 MED ORDER — ACETAMINOPHEN 325 MG PO TABS
650.0000 mg | ORAL_TABLET | Freq: Once | ORAL | Status: AC
  Administered 2024-03-16: 650 mg via ORAL
  Filled 2024-03-16: qty 2

## 2024-03-16 MED ORDER — SODIUM CHLORIDE 0.9 % IV SOLN: INTRAVENOUS | Status: DC

## 2024-03-16 MED ORDER — CETIRIZINE HCL 10 MG PO TABS
10.0000 mg | ORAL_TABLET | Freq: Once | ORAL | Status: AC
  Administered 2024-03-16: 10 mg via ORAL
  Filled 2024-03-16: qty 1

## 2024-03-16 MED ORDER — SODIUM CHLORIDE 0.9 % IV SOLN
510.0000 mg | Freq: Once | INTRAVENOUS | Status: AC
  Administered 2024-03-16: 510 mg via INTRAVENOUS
  Filled 2024-03-16: qty 510

## 2024-03-16 NOTE — Patient Instructions (Signed)

## 2024-03-25 ENCOUNTER — Telehealth: Payer: Self-pay

## 2024-03-25 ENCOUNTER — Other Ambulatory Visit (HOSPITAL_COMMUNITY): Payer: Self-pay

## 2024-03-25 NOTE — Telephone Encounter (Signed)
 SABRA

## 2024-03-25 NOTE — Telephone Encounter (Signed)
 Prolia  VOB initiated via [email or web address]  Next Prolia  inj DUE: 04/20/24

## 2024-03-25 NOTE — Telephone Encounter (Signed)
 Pt ready for scheduling for PROLIA  on or after : 04/20/24  Option# 1: Buy/Bill (Office supplied medication)  Out-of-pocket cost due at time of clinic visit: $0  Number of injection/visits approved: 2  Primary: [employer]MED[employer]ARE Prolia  co-insurance: 0% Admin fee co-insurance: 0%  Secondary: --- Prolia  co-insurance:  Admin fee co-insurance:   Medical Benefit Details: Date Benefits were checked: 03/25/24 Deductible: $200 Met of $200 Required/ Coinsurance: 0%/ Admin Fee: 0%  Prior Auth: APPROVED PA# [id number] Expiration Date: 09/19/23-09/18/24  # of doses approved: 2 ----------------------------------------------------------------------- Option# 2- Med Obtained from pharmacy:  Pharmacy benefit: Copay $150 (Paid to pharmacy) Admin Fee: 0% (Pay at clinic)  Prior Auth: N/A PA# Expiration Date:   # of doses approved:   If patient wants fill through the pharmacy benefit please send prescription to: [facility], and include estimated need by date in rx notes. Pharmacy will ship medication directly to the office.  Patient NOT eligible for Prolia  Copay Card. Copay Card can make patient's cost as little as $25. Link to apply: [email or web address]  ** This summary of benefits is an estimation of the patient's out-of-pocket cost. Exact cost may very based on individual plan coverage.

## 2024-03-27 ENCOUNTER — Encounter: Payer: Self-pay | Admitting: Internal Medicine

## 2024-03-27 ENCOUNTER — Ambulatory Visit: Payer: Self-pay | Admitting: Internal Medicine

## 2024-03-27 ENCOUNTER — Ambulatory Visit: Admitting: Internal Medicine

## 2024-03-27 VITALS — BP 139/70 | HR 55 | Temp 97.8°F | Ht 66.73 in | Wt 153.2 lb

## 2024-03-27 DIAGNOSIS — I10 Essential (primary) hypertension: Secondary | ICD-10-CM | POA: Diagnosis not present

## 2024-03-27 DIAGNOSIS — D649 Anemia, unspecified: Secondary | ICD-10-CM

## 2024-03-27 DIAGNOSIS — M810 Age-related osteoporosis without current pathological fracture: Secondary | ICD-10-CM | POA: Diagnosis not present

## 2024-03-27 DIAGNOSIS — Z79899 Other long term (current) drug therapy: Secondary | ICD-10-CM

## 2024-03-27 DIAGNOSIS — Z0000 Encounter for general adult medical examination without abnormal findings: Secondary | ICD-10-CM

## 2024-03-27 DIAGNOSIS — E785 Hyperlipidemia, unspecified: Secondary | ICD-10-CM

## 2024-03-27 LAB — LIPID PANEL
Cholesterol: 230 mg/dL — ABNORMAL HIGH (ref 0–200)
HDL: 60.4 mg/dL (ref >39.00)
LDL Cholesterol: 142 mg/dL — ABNORMAL HIGH (ref 0–99)
NonHDL: 169.58
Total CHOL/HDL Ratio: 4
Triglycerides: 138 mg/dL (ref 0.0–149.0)
VLDL: 27.6 mg/dL (ref 0.0–40.0)

## 2024-03-27 LAB — VITAMIN B12: Vitamin B-12: 964 pg/mL — ABNORMAL HIGH (ref 211–911)

## 2024-03-27 LAB — BASIC METABOLIC PANEL WITH GFR
BUN: 23 mg/dL (ref 6–23)
CO2: 31 meq/L (ref 19–32)
Calcium: 10.1 mg/dL (ref 8.4–10.5)
Chloride: 102 meq/L (ref 96–112)
Creatinine, Ser: 0.84 mg/dL (ref 0.40–1.20)
GFR: 64.83 mL/min (ref >60.00)
Glucose, Bld: 75 mg/dL (ref 70–99)
Potassium: 3.7 meq/L (ref 3.5–5.1)
Sodium: 142 meq/L (ref 135–145)

## 2024-03-27 LAB — HEPATIC FUNCTION PANEL
ALT: 14 U/L (ref 0–35)
AST: 23 U/L (ref 0–37)
Albumin: 4.4 g/dL (ref 3.5–5.2)
Alkaline Phosphatase: 78 U/L (ref 39–117)
Bilirubin, Direct: 0 mg/dL (ref 0.0–0.3)
Total Bilirubin: 0.4 mg/dL (ref 0.2–1.2)
Total Protein: 8.1 g/dL (ref 6.0–8.3)

## 2024-03-27 LAB — TSH: TSH: 1.88 u[IU]/mL (ref 0.35–5.50)

## 2024-03-27 NOTE — Progress Notes (Signed)
 Vit b12 still high  maybe just take a multivit with b12 ,    lipids  some improved from last  year but has been better

## 2024-03-27 NOTE — Progress Notes (Signed)
 Chief Complaint  Patient presents with   Annual Exam    Would like to discuss bone density done on 9/25.     HPI: Patient  Belinda Harper  83 y.o. comes in today for Preventive Health Care visit  and Chronic disease management    HT:     amlodipine  Iron  def anemia :   now given epo and followed by heme still  Pulm   fu :  and to have fy pfts and evaluation  B12 low  has decreased dose  ask about fu level  Memory no major change  delayed findings  ok to do financials and not getting lost Father :had dementia alzheimers  Is taking prolia   plans to continue  was approved  please review  dexa , no recent fracture  , takes vit d supplement Reports no sig se   Health Maintenance  Topic Date Due   COVID-19 Vaccine (3 - Pfizer risk series) 04/12/2024 (Originally 04/01/2020)   Medicare Annual Wellness (AWV)  01/21/2025   DTaP/Tdap/Td (4 - Td or Tdap) 03/19/2033   Pneumococcal Vaccine: 50+ Years  Completed   Influenza Vaccine  Completed   Bone Density Scan  Completed   Zoster Vaccines- Shingrix  Completed   Meningococcal B Vaccine  Aged Out   Health Maintenance Review LIFESTYLE:  Exercise:   slow dec  walking around circle    dec with ankle fracture and after covid. Decondition  Tobacco/ETS:  n Alcohol:  n Sugar beverages: not reg  ice tea 1/2 Sleep:   6-7  Drug use: no HH of   gd and son and son  with health issues  gd bf  Son has dog  and gd 1 d 2 cats and a bird  over the garage .      ROS:  GEN/ HEENT: No fever, significant weight changes sweats headaches vision problems hearing changes, CV/ PULM; No chest pain shortness of breath cough, syncope,edema  change in exercise tolerance. GI /GU: No adominal pain, vomiting, change in bowel habits. No blood in the stool. No significant GU symptoms. SKIN/HEME: ,no acute skin rashes suspicious lesions or bleeding. No lymphadenopathy, nodules, masses.  NEURO/ PSYCH:  No neurologic signs such as weakness numbness. No depression  anxiety. IMM/ Allergy: No unusual infections.  Allergy .   REST of 12 system review negative except as per HPI   Past Medical History:  Diagnosis Date   Allergic rhinitis    Anxiety    Bradycardia    Chronic constipation    Chronic gastritis    followed by dr shila (GI);  long hx erosive gastritis with bleed   Depression    Family history of adverse reaction to anesthesia    pt's mother as she gets older organs shut down    GERD (gastroesophageal reflux disease)    Heart palpitations    cardiologist-- dr mona;   palpitations w/ intermittant bradycardia;  event monitor 04-01-2020 multiple SVT episodes   Hemorrhoids    s/p banding 05-07-2021 by dr shila   History of asthma    childhood   History of sepsis 02/05/2015   sepsis secondart to cellulitis , left leg/ ankle due to retained ankle hardware   Hyperlipidemia    Hypertension    followed by pcp and cardiology;   normal nuclear stress test 09-28-2012   Iron  deficiency anemia secondary to blood loss (chronic)    oncologist--- dr lonn;  long hx anemia secondary gi blood loss;  treated  with transfusion's and IV iron    Leukopenia    chronic intermittantly --- followed by dr lonn   OA (osteoarthritis)    Osteoporosis    Presence of pessary    PSVT (paroxysmal supraventricular tachycardia)    Rectal bleeding    Uterine prolapse    per pt uses pessary   Wears glasses     Past Surgical History:  Procedure Laterality Date   CATARACT EXTRACTION W/ INTRAOCULAR LENS IMPLANT Bilateral    right 2000;  left 2020   COLONOSCOPY WITH ESOPHAGOGASTRODUODENOSCOPY (EGD)  12/15/2020   by dr shila   HARDWARE REMOVAL Left 02/21/2015   Procedure: IRRIGATION AND DEBRIDEMENT HARDWARE REMOVAL LEFT ANKLE ;  Surgeon: Dempsey Moan, MD;  Location: [facility];  Service: Orthopedics;  Laterality: Left;   MASS EXCISION Left 09/11/2012   Procedure: 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; Rotation Flap;  Surgeon: Arley JONELLE Curia, MD;  Location: [facility];  Service: Orthopedics;  Laterality: Left;   ORIF ANKLE FRACTURE Left 03/21/2009   @[facility]   by dr moan;   fracture and dislocation   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 , saw injury   TRANSANAL HEMORRHOIDAL DEARTERIALIZATION N/A 10/15/2021   Procedure: TRANSANAL HEMORRHOIDAL DEARTERIALIZATION;  Surgeon: Debby Hila, MD;  Location: [facility];  Service: General;  Laterality: N/A;    Family History  Problem Relation Age of Onset   Hyperlipidemia Mother    Heart disease Mother        CABG age 59   Ovarian cancer Mother    Uterine cancer Mother    Arthritis Mother    Osteoporosis Mother    Emphysema Mother    Heart disease Father        CABG age 27   Arthritis Father    Melanoma Father    Bladder Cancer Neg Hx    Renal cancer Neg Hx     Social History   Socioeconomic History   Marital status: Divorced    Spouse name: Not on file   Number of children: 2   Years of education: Not on file   Highest education level: Some college, no degree  Occupational History   Occupation: retired  Tobacco Use   Smoking status: Never    Passive exposure: Past   Smokeless tobacco: Never  Vaping Use   Vaping status: Never Used  Substance and Sexual Activity   Alcohol use: Not Currently   Drug use: No   Sexual activity: Not Currently    Partners: Male    Birth control/protection: Post-menopausal    Comment: 1st intercourse- 18, partners- 1   Other Topics Concern   Not on file  Social History Narrative   HH of  1   To rent out room    Retired artist   Never smoked    Pet cat allergic    Divorced   Regular exericise  walking mowing the lawnswimming   Silver sneakers    Social Drivers of Corporate Investment Banker Strain: Low Risk  (03/23/2024)   Overall Financial Resource Strain (CARDIA)    Difficulty of Paying Living Expenses: Not hard at all  Food Insecurity: No Food Insecurity  (03/23/2024)   Hunger Vital Sign    Worried About Running Out of Food in the Last Year: Never true    Ran Out of Food in the Last Year: Never true  Transportation Needs: No Transportation Needs (03/23/2024)   PRAPARE -  Administrator, Civil Service (Medical): No    Lack of Transportation (Non-Medical): No  Physical Activity: Insufficiently Active (03/23/2024)   Exercise Vital Sign    Days of Exercise per Week: 4 days    Minutes of Exercise per Session: 10 min  Stress: No Stress Concern Present (03/23/2024)   [employer] of Occupational Health - Occupational Stress Questionnaire    Feeling of Stress: Only a little  Social Connections: Socially Isolated (03/23/2024)   Social Connection and Isolation Panel    Frequency of Communication with Friends and Family: More than three times a week    Frequency of Social Gatherings with Friends and Family: More than three times a week    Attends Religious Services: Never    Database Administrator or Organizations: No    Attends Engineer, Structural: Not on file    Marital Status: Divorced    Outpatient Medications Prior to Visit  Medication Sig Dispense Refill   amLODipine  (NORVASC ) 5 MG tablet Take 1 tablet (5 mg total) by mouth daily. 90 tablet 3   Calcium Citrate-Vitamin D  (CITRACAL + D PO) Take 2 tablets by mouth in the morning and at bedtime. Take 2 tablets po a.m and at lunch. 800 mg calcium     cyanocobalamin  (VITAMIN B12) 1000 MCG tablet Take 500 mcg by mouth daily.     DARBEPOETIN ALFA  IJ Inject as directed as needed.     denosumab  (PROLIA ) 60 MG/ML SOSY injection Inject 60 mg into the skin every 6 (six) months.     docusate sodium  (COLACE) 100 MG capsule Take 200 mg by mouth every morning.     estradiol  (ESTRACE ) 0.1 MG/GM vaginal cream Insert 1 gm vaginally as directed twice a week. 42.5 g 3   Magnesium Glycinate 665 MG CAPS Take 200 mg by mouth daily.     Menaquinone-7 (K2 PO) Take by mouth.     metoprolol   succinate (TOPROL -XL) 25 MG 24 hr tablet TAKE 1/2 TABLET (12.5 MG TOTAL) BY MOUTH AT BEDTIME *SCHEDULE APPOINTMENT FOR FURTHER REFILLS* 45 tablet 3   Wheat Dextrin (BENEFIBER DRINK MIX PO) Take 1 Scoop by mouth daily.     Facility-Administered Medications Prior to Visit  Medication Dose Route Frequency Provider Last Rate Last Admin   [START ON 04/22/2024] denosumab  (PROLIA ) injection 60 mg  60 mg Subcutaneous Q6 months Charlean Carneal K, MD         EXAM:  BP 139/70 (BP Location: Left Arm, Patient Position: Sitting)   Pulse (!) 55   Temp 97.8 F (36.6 C) (Oral)   Ht 5' 6.73 (1.695 m)   Wt 153 lb 3.2 oz (69.5 kg)   SpO2 97%   BMI 24.19 kg/m   Body mass index is 24.19 kg/m. Wt Readings from Last 3 Encounters:  03/27/24 153 lb 3.2 oz (69.5 kg)  02/29/24 156 lb 3.2 oz (70.9 kg)  01/22/24 154 lb 1.6 oz (69.9 kg)    Physical Exam: Vital signs reviewed HZW:Uypd is a well-developed well-nourished alert cooperative    who appearsr stated age in no acute distress.  HEENT: normocephalic atraumatic , Eyes: PERRL EOM's full, conjunctiva clear, Nares: paten,t no deformity discharge or tenderness., Ears: no deformity EAC's clear TMs with normal landmarks. Mouth: clear OP, no lesions, edema.  Moist mucous membranes. Dentition in adequate repair. NECK: supple without masses, thyromegaly or bruits. CHEST/PULM:  Clear to auscultation and percussion breath sounds equal no wheeze , rales or rhonchi. Breast: normal by  inspection . No dimpling, discharge, masses, tenderness or discharge . CV: PMI is nondisplaced, S1 S2 no gallops, murmurs, rubs. Peripheral pulses are present without delay..  ABDOMEN: Bowel sounds normal nontender  No guard or rebound, no hepato splenomegal no CVA tenderness.   Extremtities:  No clubbing cyanosis or edema, no acute joint swelling or redness no focal atrophy NEURO:  Oriented x3, cranial nerves 3-12 appear to be intact, no obvious focal weakness,gait within normal limits  no abnormal reflexes or asymmetrical forma l memory testing not done today  SKIN: No acute rashes normal turgor, color, no bruising or petechiae. PSYCH: Oriented, good eye contact, no obvious depression anxiety, cognition and judgment appear grossly normal. LN: no cervical axillary  adenopathy  Lab Results  Component Value Date   WBC 3.6 (L) 02/22/2024   HGB 10.9 (L) 02/22/2024   HCT 34.6 (L) 02/22/2024   PLT 203 02/22/2024   GLUCOSE 75 03/27/2024   CHOL 230 (H) 03/27/2024   TRIG 138.0 03/27/2024   HDL 60.40 03/27/2024   LDLDIRECT 141.9 08/06/2012   LDLCALC 142 (H) 03/27/2024   ALT 14 03/27/2024   AST 23 03/27/2024   NA 142 03/27/2024   K 3.7 03/27/2024   CL 102 03/27/2024   CREATININE 0.84 03/27/2024   BUN 23 03/27/2024   CO2 31 03/27/2024   TSH 1.88 03/27/2024   INR 1.04 04/10/2009   HGBA1C 5.4 03/15/2023    BP Readings from Last 3 Encounters:  03/27/24 139/70  03/16/24 (!) 115/55  03/09/24 137/60   Last vitamin D  Lab Results  Component Value Date   VD25OH 83.58 06/10/2020   Lab Results  Component Value Date   VITAMINB12 964 (H) 03/27/2024    Lab results reviewed with patient   ASSESSMENT AND PLAN:  Discussed the following assessment and plan:    ICD-10-CM   1. Visit for preventive health examination  Z00.00     2. Osteoporosis, unspecified osteoporosis type, unspecified pathological fracture presence  M81.0 Lipid panel    Hepatic function panel    TSH    Vitamin B12    Basic metabolic panel with GFR    3. Hypertension, unspecified type  I10 Lipid panel    Hepatic function panel    TSH    Vitamin B12    Basic metabolic panel with GFR    4. Medication management  Z79.899 Lipid panel    Hepatic function panel    TSH    Vitamin B12    Basic metabolic panel with GFR    5. Hyperlipidemia, unspecified hyperlipidemia type  E78.5 Lipid panel    Hepatic function panel    TSH    Vitamin B12    Basic metabolic panel with GFR    6. Anemia,  unspecified type  D64.9 Lipid panel    Hepatic function panel    TSH    Vitamin B12    Basic metabolic panel with GFR   iron  defic and  on going     Has dec b12 supp since last check and to recheck   Memory issues  some may be  age decline but  father had alzheimers  will follow up. Lipid tsh panel  . Optimize measures . To fu with pumonary evaluation.   Prob cause of dec exercise tolerance . Dexa scan reviewed stable or improved  continue prolia   update bmet Return in about 6 months (around 09/25/2024) for med memory check .  Patient Care Team: Hendryx Ricke K, MD as PCP -  General (Internal Medicine) Mona Vinie BROCKS, MD as PCP - Cardiology (Cardiology) Octavia Charleston, MD (Ophthalmology) Rea Charleston, DDS (Dentistry) Ivin Kocher, MD as Consulting Physician (Dermatology) Lonn Hicks, MD as Consulting Physician (Hematology and Oncology) Melodi Lerner, MD as Consulting Physician (Orthopedic Surgery) Mannam, Praveen, MD as Consulting Physician (Pulmonary Disease) Patient Instructions  Good to see you today . Bone density is stable  and one site is somewhat improved . Updating labs today .  Keep fu with lung function.    Ophelia Sipe K. Michelle Wnek M.D.

## 2024-03-27 NOTE — Patient Instructions (Addendum)
 Good to see you today . Bone density is stable  and one site is somewhat improved . Updating labs today .  Keep fu with lung function.

## 2024-04-11 ENCOUNTER — Ambulatory Visit

## 2024-04-11 ENCOUNTER — Ambulatory Visit: Admitting: Internal Medicine

## 2024-04-11 VITALS — BP 116/70 | HR 56 | Temp 97.8°F | Ht 67.0 in | Wt 154.0 lb

## 2024-04-11 DIAGNOSIS — R918 Other nonspecific abnormal finding of lung field: Secondary | ICD-10-CM | POA: Diagnosis not present

## 2024-04-11 DIAGNOSIS — J849 Interstitial pulmonary disease, unspecified: Secondary | ICD-10-CM

## 2024-04-11 DIAGNOSIS — Z87898 Personal history of other specified conditions: Secondary | ICD-10-CM

## 2024-04-11 DIAGNOSIS — R942 Abnormal results of pulmonary function studies: Secondary | ICD-10-CM

## 2024-04-11 DIAGNOSIS — M419 Scoliosis, unspecified: Secondary | ICD-10-CM

## 2024-04-11 DIAGNOSIS — R062 Wheezing: Secondary | ICD-10-CM

## 2024-04-11 DIAGNOSIS — Z9109 Other allergy status, other than to drugs and biological substances: Secondary | ICD-10-CM

## 2024-04-11 LAB — PULMONARY FUNCTION TEST
DL/VA % pred: 90 %
DL/VA: 3.61 ml/min/mmHg/L
DLCO cor % pred: 66 %
DLCO cor: 13.71 ml/min/mmHg
DLCO unc % pred: 66 %
DLCO unc: 13.71 ml/min/mmHg
FEF 25-75 Pre: 0.96 L/s
FEF2575-%Pred-Pre: 65 %
FEV1-%Pred-Pre: 59 %
FEV1-Pre: 1.3 L
FEV1FVC-%Pred-Pre: 102 %
FEV6-%Pred-Pre: 62 %
FEV6-Pre: 1.71 L
FEV6FVC-%Pred-Pre: 105 %
FVC-%Pred-Pre: 59 %
FVC-Pre: 1.72 L
Pre FEV1/FVC ratio: 76 %
Pre FEV6/FVC Ratio: 100 %

## 2024-04-11 NOTE — Patient Instructions (Addendum)
 ICD-10-CM   1. Interstitial lung abnormality (ILA)  R91.8     2. House dust mite allergy  Z91.09     3. History of wheezing  Z87.898     4. Scoliosis, unspecified scoliosis type, unspecified spinal region  M41.9     5. Abnormal PFT  R94.2      Interstitial lung abnormality (ILA)   - Mild interstitial lung abnormalities [I do not think you have disease] present on CT scan of the chest  Plan - Observation therapy at this moment - Will do spirometry and DLCO in 9 months  House dust mite allergy History of wheezing  -Glad there is no wheezing right now but there is significant house dust mite allergy on blood test March 2025  Plan - Noted reluctance to get dust mite proof sheets - Continue to use high-end air filters in the house - Do increase the frequency of bedsheet cleaning with hot water to once a week  Scoliosis, unspecified scoliosis type, unspecified spinal region Abnormal PFT  -Abnormal breathing test coming as a result of scoliosis and also anemia and possibly from element from interstitial lung abnormality  Plan - Continue to monitor; do spirometry and DLCO in 9 months   Follow-up - 9 months or sooner if needed; 15-minute visit but after spiro and dlco

## 2024-04-11 NOTE — Patient Instructions (Signed)
 Spirometry and diffusion capacity performed today.

## 2024-04-11 NOTE — Progress Notes (Signed)
 OV 07/11/2023  Subjective:  Patient ID: Belinda Harper, female , DOB: 11-04-1940 , age 83 y.o. , MRN: [id number] , ADDRESS: [address] [place] [address] PCP Charlett, Apolinar POUR, MD Patient Care Team: Charlett Apolinar POUR, MD as PCP - General (Internal Medicine) Mona Vinie JAYSON, MD as PCP - Cardiology (Cardiology) Octavia Charleston, MD (Ophthalmology) Rea Charleston, DDS (Dentistry) Ivin Kocher, MD as Consulting Physician (Dermatology) Lonn Hicks, MD as Consulting Physician (Hematology and Oncology) Melodi Lerner, MD as Consulting Physician (Orthopedic Surgery) Mannam, Praveen, MD as Consulting Physician (Pulmonary Disease)  This Provider for this visit: Treatment Team:  Attending Provider: Geronimo Amel, MD    07/11/2023 -   Chief Complaint  Patient presents with   Pulmonary Consult    Referred by Dr. Apolinar Charlett for eval of asthma. She had asthma as a child and stopped having symptoms around age 89. She started wheezing at night 3-4 months ago.      HPI Belinda Harper 45 y.o. -ex-wife of Mr. Rosaisela Jamroz who is my patient with COPD.  Patient herself has chronic scoliosis.  She is a primary patient of Dr. Charlett.  She states that she was a passive smoker.  This is because apparent smoke and then when she was married to Mr. Christabell Loseke between 8036 and 2003 he smoked although he quit sometime in the 1980s.  She herself does not have any pulmonary issues.  Although as a child when she was 18 she is to have nocturnal wheezing and she had a cat allergy.  She still believes she has cat allergy even though she has a cat.  She also has seasonal allergies but she manages with a cough that happens in the springtime.  Her most recently for the last 3 to 4 months she started noticing new onset nocturnal wheezing.  There is no cough there is no shortness of breath.  She has chronic venous stasis edema for the last few years without any change.  So she saw her primary care doctor.   They did a chest x-ray that I personally visualized and shows hyperinflated lungs and basal atelectasis.  She has chronic scoliosis and does concern for bronchiectasis in the lower base [she has crackles here] therefore she is being referred here.  Other medical issues include -chronic scoliosis - Chronic anemia hemoglobin between 8 and 10 g% - Never had MI.  No A-fib no stroke no cancer no diabetes - Does have history of hypertension - Does have seasonal allergies - Skin test +6 years ago - History of cat allergy positivity.  Not otherwise specified.     Latest Reference Range & Units 09/20/06 09:34 04/06/07 08:49 05/19/08 10:01 10/21/08 16:11 03/20/09 16:07 04/10/09 11:16 04/10/09 17:07 04/20/09 11:15 05/18/09 10:31 06/22/09 10:09 01/27/10 14:19 06/28/10 11:18 02/11/11 15:30 02/23/11 16:22 04/04/11 15:55 08/03/11 12:10 09/28/11 16:00 01/18/12 16:02 05/28/12 16:00 07/18/12 16:00 11/21/12 16:09 02/20/13 15:02 05/15/13 15:03 08/22/13 14:19 11/15/13 09:55 12/11/13 09:29 09/24/14 09:41 11/03/14 15:33 02/05/15 18:35 02/17/15 14:56 02/18/15 17:56 09/30/15 09:44 03/30/16 15:08 08/10/16 15:35 10/18/16 09:44 10/20/17 11:10 04/23/18 16:34 04/26/18 10:02 11/21/18 12:15 03/25/19 10:50 05/22/19 13:57 12/06/19 11:47 01/27/20 15:26 02/13/20 09:02 06/10/20 10:30 07/29/20 12:01 11/05/20 12:11 11/27/20 09:50 12/21/20 10:12 02/02/21 12:12 03/23/21 11:59 05/17/21 12:05 05/28/21 08:02 06/10/21 11:42 06/25/21 08:14 07/16/21 07:55 08/09/21 08:11 09/06/21 10:21 10/11/21 08:38 11/08/21 10:53 11/18/21 08:48 11/29/21 09:10 12/13/21 09:11 12/30/21 09:01 01/13/22 08:59 01/27/22 08:54 02/24/22 08:53 05/26/22 08:56 06/16/22 09:20 09/15/22 08:52 11/21/22 13:51  01/12/23 08:51 03/15/23 14:46 04/06/23 09:35 05/18/23 09:19 06/14/23 07:20  Eosinophils Absolute 0.0 - 0.5 K/uL 0.1 0.2 0.1 0.1 0.1 0.1 0.1 0.3 0.1 0.1 0.1 0.1 0.1 0.1 0.2 0.2 0.2 0.1 0.2 0.2 0.2 0.2 0.1 0.2 0.1 0.1 0.0 0.2 0.0 0.1 0.1 0.1 0.3 0.2 0.1 0.1 0.2 0.2 0.2 0.1 0.2  152 0.1 0.2 0.2 0.2 0.2 0.1 0.2 0.1 0.1 0.2 0.2 0.1 0.2 0.3 0.3 0.2 0.2 0.2 0.2 0.2 0.2 0.2 0.2 0.2 0.2 0.3 0.3 0.2 0.1 0.2 0.2 0.2 0.3 0.3     arrative & Impression  CLINICAL DATA:  83 year old female with wheezing   EXAM: CHEST - 2 VIEW   COMPARISON:  03/03/2020   FINDINGS: Cardiomediastinal silhouette unchanged in size and contour. No evidence of central vascular congestion. No interlobular septal thickening.   Stigmata of emphysema, with increased retrosternal airspace, flattened hemidiaphragms, increased AP diameter, and hyperinflation on the AP view.   Bronchiectasis with chronic interstitial opacities. Bilateral architectural distortion is similar to the prior.   No new airspace disease.   No pneumothorax or pleural effusion.   No acute displaced fracture. Degenerative changes of the spine and scoliosis.   IMPRESSION: Emphysema, chronic changes and bronchiectasis, without definite evidence of acute cardiopulmonary disease     Electronically Signed   By: Ami Bellman D.O.   On: 03/31/2023 16:18      10/11/2023- interim hx Discussed the use of AI scribe software for clinical note transcription with the patient, who gave verbal consent to proceed.  History of Present Illness   Belinda Harper is an 83 year old female with COPD who presents for a pulmonary function test and evaluation of wheezing. She was referred by [name] for evaluation of her COPD and respiratory symptoms.  She has a history of COPD and was previously evaluated in March. She has no history of smoking but has had frequent exposure to smoke. A recent chest x-ray showed evidence of emphysema and bronchiectasis. HRCT scan revealed very subtle signs of interstitial lung disease, indeterminate for UIP.   She experiences occasional wheezing at night, which she describes as 'very, very minimal' and not occurring every day. She has a history of asthma as a child. She is not currently using an  inhaler and prefers not to start one unless absolutely necessary. She is seeking to establish a baseline for her respiratory condition.  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 She has scoliosis, which may contribute to some restriction in lung function. She has been around welding and outdoors frequently, which may have exposed her to environmental irritants.  She has a significant allergy to dust mites and owns an old air purifier but has not used it due to uncertainty about filter replacement. She also has mild allergies to mold, cat, and dog, and lives in an older house with a history of mold issues. She enjoys gardening but finds wearing a mask difficult due to breathing issues.  She does not currently take any medications for her allergies or respiratory symptoms, preferring to manage them through lifestyle adjustments. Her lab work showed a normal eosinophil count and a low-level allergy to cats. She experiences morning congestion, which she attributes to a past endoscopy.      Pulmonary function testing 10/11/23>> FVC 1.85 (62%), FEV1 1.42 (64%), ratio 77, DLCOunc 14.00 (67%) Moderate restriction with moderate diffusion defect and positive BD    OV 04/11/2024  Subjective:  Patient ID:  Belinda Harper, female , DOB: [birth year] , age 22 y.o. , MRN: [id number] , ADDRESS: [address] [place] [address] PCP Charlett, Apolinar POUR, MD Patient Care Team: Charlett Apolinar POUR, MD as PCP - General (Internal Medicine) Mona Vinie JAYSON, MD as PCP - Cardiology (Cardiology) Octavia Charleston, MD (Ophthalmology) Rea Charleston, DDS (Dentistry) Ivin Kocher, MD as Consulting Physician (Dermatology) Lonn Hicks, MD as Consulting Physician (Hematology and Oncology) Melodi Lerner, MD as Consulting Physician (Orthopedic Surgery) Mannam, Praveen, MD as Consulting Physician (Pulmonary Disease)  This Provider for this visit: Treatment Team:  Attending Provider:  Geronimo Amel, MD    04/11/2024 -   Chief Complaint  Patient presents with   Interstitial Lung Disease    Pt states since LOV breathing has been fine, but not as good as it use to be SOB occurs w/ exertion      HPI Belinda Harper 83 y.o. 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  Her shortness of breath has remained stable over the past six months, with no new health issues, hospitalizations, or emergency room visits during this period. She does not use any inhalers currently. She experienced wheezing at night about nine months ago, but this symptom has not recurred in the past six months.  She has a history of asthma from childhood and was born with scoliosis, which was diagnosed at age 35. Her grandmother had noticed her posture was 'lopsided,' but her mother initially ignored these observations.  She has allergies to cat, dog, and dust mites, with dust mites being the most significant allergen. She uses an air purifier in one room and changes her air filters every three months. She has not implemented dust mite control measures for her bedding, such as using dust mite-proof covers or washing bed sheets weekly in hot water, as she finds these measures uncomfortable.  Her breathing tests have remained unchanged during this time.  She lives with a dog that is present in the evenings and has a son who assists her with cooking.   SYMPTOM SCALE - ILD 04/11/2024  Current weight   O2 use ra  Shortness of Breath 0 -> 5 scale with 5 being worst (score 6 If unable to do)  At rest 0  Simple tasks - showers, clothes change, eating, shaving 0  Household (dishes, doing bed, laundry) 2  Shopping 3  Walking level at own pace 4  Walking up Stairs 12  Total (30-36) Dyspnea Score 0  How bad is your cough? 2  How bad is your fatigue 0  How bad is appetiee 0  How bad is nausea 0  How bad is vomiting?  0   How bad is diarrhea? 0  How bad is anxiety? 2  How bad is depression 1  Any chronic pain - if so where and how bad 0      PFT     Latest Ref Rng & Units 04/11/2024   10:18 AM 10/11/2023    2:34 PM  PFT Results  FVC-Pre L 1.72  P 1.67   FVC-Predicted Pre % 59  P 56   FVC-Post L  1.85   FVC-Predicted Post %  62   Pre FEV1/FVC % % 76  P 75   Post FEV1/FCV % %  77   FEV1-Pre L 1.30  P 1.26   FEV1-Predicted Pre % 59  P 57   FEV1-Post L  1.42   DLCO uncorrected ml/min/mmHg 13.71  P 14.00   DLCO UNC% % 66  P 67   DLCO corrected ml/min/mmHg 13.71  P 14.00   DLCO COR %Predicted % 66  P 67   DLVA Predicted % 90  P 100   TLC L  4.08   TLC % Predicted %  74   RV % Predicted %  90     P Preliminary result       LAB RESULTS last 96 hours No results found.       has a past medical history of Allergic rhinitis, Allergy, Anxiety, Asthma, Blood transfusion without reported diagnosis (12 2010), Bradycardia, Cataract, Chronic constipation, Chronic gastritis, Clotting disorder, Depression, Family history of adverse reaction to anesthesia, GERD (gastroesophageal reflux disease), Heart palpitations, Hemorrhoids, History of asthma, History of sepsis (02/05/2015), Hyperlipidemia, Hypertension, Iron  deficiency anemia secondary to blood loss (chronic), Leukopenia, OA (osteoarthritis), Osteoporosis, Presence of pessary, PSVT (paroxysmal supraventricular tachycardia), Rectal bleeding, Uterine prolapse, and Wears glasses.   reports that she has never smoked. She has been exposed to tobacco smoke. She has never used smokeless tobacco.  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; Rotation Flap;  Surgeon: Arley JONELLE Curia, MD;  Location: [facility];  Service: Orthopedics;  Laterality: Left;   ORIF ANKLE FRACTURE Left 03/21/2009   @[facility]   by dr moan;   fracture and dislocation   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 , saw injury   TRANSANAL HEMORRHOIDAL DEARTERIALIZATION N/A 10/15/2021   Procedure: TRANSANAL HEMORRHOIDAL DEARTERIALIZATION;  Surgeon: Debby Hila, MD;  Location: [facility];  Service: General;  Laterality: N/A;   TUBAL LIGATION  10-09-1978    Allergies[1]  Immunization History  Administered Date(s) Administered   Fluad Quad(high Dose 65+) 03/02/2021, 03/02/2022, 02/23/2024   INFLUENZA, HIGH DOSE SEASONAL PF 03/23/2016, 04/24/2017, 02/09/2018, 03/19/2019   Influenza Whole 03/10/2009, 01/27/2010   Influenza,inj,Quad PF,6+ Mos 04/08/2020   Influenza-Unspecified 03/20/2023   PFIZER(Purple Top)SARS-COV-2 Vaccination 02/12/2020, 03/04/2020   Pneumococcal Conjugate-13 12/18/2013   Pneumococcal Polysaccharide-23 04/25/2006, 10/18/2012   Td 04/25/1997, 06/22/2009   Tdap 03/20/2023   Zoster Recombinant(Shingrix) 11/01/2017, 01/04/2018   Zoster, Live 06/28/2010    Family History  Problem Relation Age of Onset   Hyperlipidemia Mother    Heart disease Mother        CABG age 21   Ovarian cancer Mother    Uterine cancer Mother    Arthritis Mother    Osteoporosis Mother    Emphysema Mother    Heart disease Father        CABG age 16   Arthritis Father    Melanoma Father    COPD Son    Depression Son    Anxiety disorder Son    Bladder Cancer Neg Hx    Renal cancer Neg Hx     Current Medications[2]  Objective:   Vitals:   04/11/24 1101  BP:  116/70  Pulse: (!) 56  Temp: 97.8 F (36.6 C)  TempSrc: Oral  SpO2: 96%  Weight: 154 lb (69.9 kg)  Height: 5' 7 (1.702 m)    Estimated body mass index is 24.12 kg/m as calculated from the following:   Height as of this encounter: 5' 7 (1.702 m).   Weight as of this encounter: 154 lb (69.9 kg).  @WEIGHTCHANGE @ Doing Filed Weights   04/11/24 1101  Weight: 154 lb (69.9 kg)     Physical Exam   General: No distress. Looks well O2 at rest: no Cane present: nono Sitting in wheel chair: n Frail: YES Obese: no Neuro: Alert and Oriented x 3. GCS 15. Speech normal Psych: Pleasant Resp:  Barrel Chest - no.  Wheeze - no, Crackles - YES BASE, No overt respiratory distress CVS: Normal heart sounds. Murmurs - no Ext: Stigmata of Connective Tissue Disease - no HEENT: Normal upper airway. PEERL +. No post nasal drip        Assessment/     Assessment & Plan Interstitial lung abnormality (ILA)  House dust mite allergy  History of wheezing  Scoliosis, unspecified scoliosis type, unspecified spinal region  Abnormal PFT    PLAN Patient Instructions     ICD-10-CM   1. Interstitial lung abnormality (ILA)  R91.8     2. House dust mite allergy  Z91.09     3. History of wheezing  Z87.898     4. Scoliosis, unspecified scoliosis type, unspecified spinal region  M41.9     5. Abnormal PFT  R94.2      Interstitial lung abnormality (ILA)   - Mild interstitial lung abnormalities [I do not think you have disease] present on CT scan of the chest  Plan - Observation therapy at this moment - Will do spirometry and DLCO in 9 months  House dust mite allergy History of wheezing  -Glad there is no wheezing right now but there is significant house dust mite allergy on blood test March 2025  Plan - Noted reluctance to get dust mite proof sheets - Continue to use high-end air filters in the house - Do increase the frequency of bedsheet cleaning with hot water to once a  week  Scoliosis, unspecified scoliosis type, unspecified spinal region Abnormal PFT  -Abnormal breathing test coming as a result of scoliosis and also anemia and possibly from element from interstitial lung abnormality  Plan - Continue to monitor; do spirometry and DLCO in 9 months   Follow-up - 9 months or sooner if needed; 15-minute visit but after spiro and dlco    FOLLOWUP    Return in about 9 months (around 01/10/2025) for 15 min visit, with Dr Geronimo, after Spiro and DLCO.    SIGNATURE    Dr. Dorethia Geronimo, M.D., F.C.C.P,  Pulmonary and Critical Care Medicine Staff Physician, [facility] Center Director - Interstitial Lung Disease  Program  Pulmonary Fibrosis [facility] at [facility] [place], [place], [address]  Pager: [phone], If no answer or between  15:00h - 7:00h: call [phone] Telephone: [phone]  7:52 PM 04/11/2024    [1]  Allergies Allergen Reactions   Sulfamethoxazole Rash   Venofer  [Iron  Sucrose] Other (See Comments)    Chest pain, tingling around mouth, lightheaded   Aspirin Other (See Comments)    bleeding   Nsaids Other (See Comments)    Drops  hemoglobin levels   Sulfa Antibiotics Rash   Yellow Jacket Venom     Other reaction(s): decreased blood pressure, dizziness  [2]  Current Outpatient Medications:    amLODipine  (NORVASC ) 5 MG tablet, Take 1 tablet (5 mg total) by mouth daily., Disp: 90 tablet, Rfl: 3   Calcium Citrate-Vitamin D  (CITRACAL + D PO), Take 2 tablets by mouth in the morning and at bedtime. Take 2 tablets po a.m and at lunch. 800 mg calcium, Disp: , Rfl:    cyanocobalamin  (VITAMIN B12) 1000 MCG tablet, Take 500 mcg by mouth daily., Disp: , Rfl:    DARBEPOETIN ALFA  IJ, Inject as directed as needed., Disp: , Rfl:    denosumab  (PROLIA ) 60 MG/ML SOSY injection, Inject 60 mg into the skin every 6 (six) months., Disp: , Rfl:    docusate sodium  (COLACE) 100 MG capsule, Take 200 mg  by mouth every morning., Disp: , Rfl:    estradiol  (ESTRACE ) 0.1 MG/GM vaginal cream, Insert 1 gm vaginally as directed twice a week., Disp: 42.5 g, Rfl: 3   Magnesium Glycinate 665 MG CAPS, Take 200 mg by mouth daily., Disp: , Rfl:    Menaquinone-7 (K2 PO), Take by mouth., Disp: , Rfl:    metoprolol  succinate (TOPROL -XL) 25 MG 24 hr tablet, TAKE 1/2 TABLET (12.5 MG TOTAL) BY MOUTH AT BEDTIME *SCHEDULE APPOINTMENT FOR FURTHER REFILLS*, Disp: 45 tablet, Rfl: 3   Wheat Dextrin (BENEFIBER DRINK MIX PO), Take 1 Scoop by mouth daily., Disp: , Rfl:   Current Facility-Administered Medications:    [START ON 04/22/2024] denosumab  (PROLIA ) injection 60 mg, 60 mg, Subcutaneous, Q6 months, Panosh, Wanda K, MD

## 2024-04-11 NOTE — Progress Notes (Signed)
 Spirometry and diffusion capacity performed today.

## 2024-04-22 ENCOUNTER — Ambulatory Visit

## 2024-04-22 DIAGNOSIS — M810 Age-related osteoporosis without current pathological fracture: Secondary | ICD-10-CM | POA: Diagnosis not present

## 2024-04-22 NOTE — Progress Notes (Signed)
 Patient is in office today for a nurse visit for  Prolia injection.   . Patient Injection was given in the  Right arm. Patient tolerated injection well.

## 2024-05-06 ENCOUNTER — Encounter: Payer: Self-pay | Admitting: *Deleted

## 2024-05-07 ENCOUNTER — Ambulatory Visit: Admitting: Obstetrics and Gynecology

## 2024-05-15 ENCOUNTER — Other Ambulatory Visit: Payer: Self-pay | Admitting: Physician Assistant

## 2024-05-16 ENCOUNTER — Ambulatory Visit: Attending: Cardiology | Admitting: Physician Assistant

## 2024-05-16 ENCOUNTER — Encounter: Payer: Self-pay | Admitting: Physician Assistant

## 2024-05-16 VITALS — BP 128/60 | HR 67 | Ht 66.0 in | Wt 153.8 lb

## 2024-05-16 DIAGNOSIS — R011 Cardiac murmur, unspecified: Secondary | ICD-10-CM | POA: Diagnosis not present

## 2024-05-16 DIAGNOSIS — E785 Hyperlipidemia, unspecified: Secondary | ICD-10-CM

## 2024-05-16 DIAGNOSIS — I471 Supraventricular tachycardia, unspecified: Secondary | ICD-10-CM | POA: Diagnosis not present

## 2024-05-16 DIAGNOSIS — D649 Anemia, unspecified: Secondary | ICD-10-CM

## 2024-05-16 MED ORDER — AMLODIPINE BESYLATE 5 MG PO TABS: 5.0000 mg | ORAL_TABLET | Freq: Every day | ORAL | 3 refills | Status: AC

## 2024-05-16 MED ORDER — METOPROLOL SUCCINATE ER 25 MG PO TB24: ORAL_TABLET | ORAL | 3 refills | Status: AC

## 2024-05-16 NOTE — Progress Notes (Signed)
 " Cardiology Office Note   Date:  05/16/2024  ID:  [name], [name] [birth year], MRN [id number] PCP: Charlett Apolinar POUR, MD  [facility] Providers Cardiologist:  Vinie JAYSON Maxcy, MD     History of Present Illness Belinda Harper is a 84 y.o. female with a hx of anemia, hypertension and hyperlipidemia.  She was remotely seen by [name] in 2014 for dyspnea.  Myoview  at the time was negative with EF of 68%.  She has a family history of coronary artery disease with her father having 5 vessel CABG. Patient was seen by [name] in November 2021 with intermittent palpitation with bradycardia in the 30s.  There was no evidence of bradycardia on the EKG.  She was not on any AV nodal blocking agent.  A 2-week heart monitor was recommended.  Heart monitor showed multiple SVT episodes which are likely the cause of her palpitation.  Slowest heart rate was in the upper 40s and this occurred at night, therefore [name] recommended at 12.5 mg daily of Toprol -XL at night to suppress her palpitation.  She has been followed by hematology service for anemia.  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  She has chronic intermittent leukopenia since 2007.  She also had postmenopausal vaginal bleeding and followed by GYN service.  I last saw the patient in October 2024 at which time her anemia was getting worse, therefore she was feeling fatigued.  She was receiving iron  infusion. Anemia has been followed by hematology/oncology [name], she was last seen by hematology service  in November 2025.  Most recent CBC obtained in October 2025 showed hemoglobin of 10.9, white blood cell count 3.6, platelet of 203.  Most recent lipid panel obtained on 03/27/2024 showed total cholesterol 230, LDL 142 which is slightly improved from a year ago, HDL 60, triglyceride 138.  TSH and CMP were normal.  She has been seen by [name] of pulmonology  service on 04/11/2024 due to chronic wheezing and shortness of breath.  Previous chest x-ray showed emphysema and chronic changes and bronchiectasis.  Pulmonary function test in June 2025 showed moderate restriction with moderate diffusion defect.  CT scan showed mild interstitial lung abnormality, although according to the recent record, pulmonology service did not think she has interstitial lung disease, therefore observation was recommended.  Patient presents today for follow-up.  She has not had any major palpitation while on the metoprolol  succinate.  She denies any exertional chest pain.  She does get short of breath with more strenuous activity but not with everyday activity.  On exam, she has mild basilar crackles likely related to interstitial pulmonary changes seen on the previous imaging.  She does not appear to be volume overloaded.  On exam, she also has a mild aortic heart murmur, I will order a repeat echocardiogram in the next 6 months.  Her last echo was obtained in 2016.  We did review of her recent lipid panel.  She is aware that her LDL remain elevated and that the goal is to push her LDL to be less than 100.  She does not wish to take any cholesterol medication.  She was to continue with diet and exercise.  She is aware of the correlation between high cholesterol level and heart disease.  Overall, I think she is doing well.  She has chronic fatigue from anemia, however her hemoglobin has improved and  is now around 11.  She can follow-up in 1 year if her echocardiogram is reassuring.  ROS:   Patient complains of chronic fatigue, she gets dyspneic with more strenuous activity but not with everyday activity.  She denies any chest pain, she has no lower extremity edema, orthopnea or PND.  Studies Reviewed EKG Interpretation Date/Time:  Thursday May 16 2024 08:22:58 EST Ventricular Rate:  67 PR Interval:  212 QRS Duration:  80 QT Interval:  394 QTC Calculation: 416 R  Axis:   40  Text Interpretation: Sinus rhythm with 1st degree A-V block When compared with ECG of 14-Feb-2023 13:31, No significant change was found Confirmed by Janene Boer [phone]) on 05/16/2024 8:28:34 AM    Cardiac Studies & Procedures   ______________________________________________________________________________________________     ECHOCARDIOGRAM  ECHOCARDIOGRAM COMPLETE 02/09/2015  Narrative *[place]* *[facility]* [address] [place], [place] [address] [phone]  ------------------------------------------------------------------- Transthoracic Echocardiography  Patient:    [name], [name] MR #:       [id number] Study Date: 02/09/2015 Gender:     F Age:        15 Height:     172.7 cm Weight:     72.1 kg BSA:        1.87 m^2 Pt. Status: Room:       [address] [name] [id number] REFERRING    [address] [name] [id number] SONOGRAPHER  Orvil Holmes ADMITTING    [name] M PERFORMING   [facility], Inpatient ATTENDING    [name] Search  cc:  ------------------------------------------------------------------- LV EF: 60% -   65%  ------------------------------------------------------------------- Indications:      Dyspnea 786.09.  ------------------------------------------------------------------- History:   PMH:  Cellulitis.  Dyspnea.  Angina pectoris.  ------------------------------------------------------------------- Study Conclusions  - Left ventricle: The cavity size was normal. There was mild concentric hypertrophy. Systolic function was normal. The estimated ejection fraction was in the range of 60% to 65%. Wall motion was normal; there were no regional wall motion abnormalities. - Aortic valve: Trileaflet; normal thickness, mildly calcified leaflets. - Mitral valve: There was mild regurgitation. - Tricuspid valve: There was mild regurgitation.  Transthoracic echocardiography.  M-mode, complete 2D,  spectral Doppler, and color Doppler.  Birthdate:  Patient birthdate: May 22, 1940.  Age:  Patient is 84 yr old.  Sex:  Gender: female. BMI: 24.2 kg/m^2.  Blood pressure:     151/76  Patient status: Inpatient.  Study date:  Study date: 02/09/2015. Study time: 03:31 PM.  Location:  Bedside.  -------------------------------------------------------------------  ------------------------------------------------------------------- Left ventricle:  The cavity size was normal. There was mild concentric hypertrophy. Systolic function was normal. The estimated ejection fraction was in the range of 60% to 65%. Wall motion was normal; there were no regional wall motion abnormalities.  ------------------------------------------------------------------- Aortic valve:   Trileaflet; normal thickness, mildly calcified leaflets. Mobility was not restricted.  Doppler:  Transvalvular velocity was within the normal range. There was no stenosis. There was no regurgitation.  ------------------------------------------------------------------- Aorta:  Aortic root: The aortic root was normal in size.  ------------------------------------------------------------------- Mitral valve:   Structurally normal valve.   Mobility was not restricted.  Doppler:  Transvalvular velocity was within the normal range. There was no evidence for stenosis. There was mild regurgitation.    Peak gradient (D): 4 mm Hg.  ------------------------------------------------------------------- Left atrium:  The atrium was normal in size.  ------------------------------------------------------------------- Right ventricle:  The cavity size was normal. Wall thickness was normal. Systolic function was normal.  -------------------------------------------------------------------  Pulmonic valve:    Doppler:  Transvalvular velocity was within the normal range. There was no evidence for  stenosis.  ------------------------------------------------------------------- Tricuspid valve:   Structurally normal valve.    Doppler: Transvalvular velocity was within the normal range. There was mild regurgitation.  ------------------------------------------------------------------- Pulmonary artery:   The main pulmonary artery was normal-sized. Systolic pressure was within the normal range.  ------------------------------------------------------------------- Right atrium:  The atrium was normal in size.  ------------------------------------------------------------------- Pericardium:  There was no pericardial effusion.  ------------------------------------------------------------------- Systemic veins: Inferior vena cava: The vessel was normal in size.  ------------------------------------------------------------------- Measurements  Left ventricle                         Value        Reference LV ID, ED, PLAX chordal        (L)     34.6  mm     43 - 52 LV ID, ES, PLAX chordal        (L)     20.7  mm     23 - 38 LV fx shortening, PLAX chordal         40    %      >=29 LV PW thickness, ED                    12    mm     --------- IVS/LV PW ratio, ED                    1.04         <=1.3 LV e&', lateral                         11.3  cm/s   --------- LV E/e&', lateral                       8.33         --------- LV e&', medial                          9.14  cm/s   --------- LV E/e&', medial                        10.3         --------- LV e&', average                         10.22 cm/s   --------- LV E/e&', average                       9.21         ---------  Ventricular septum                     Value        Reference IVS thickness, ED                      12.5  mm     ---------  Aorta                                  Value        Reference Aortic root ID, ED  31    mm     ---------  Left atrium                            Value        Reference LA  ID, A-P, ES                         33    mm     --------- LA ID/bsa, A-P                         1.77  cm/m^2 <=2.2 LA volume, ES, 1-p A4C                 73.2  ml     --------- LA volume/bsa, ES, 1-p A4C             39.2  ml/m^2 --------- LA volume, ES, 1-p A2C                 70.7  ml     --------- LA volume/bsa, ES, 1-p A2C             37.9  ml/m^2 ---------  Mitral valve                           Value        Reference Mitral E-wave peak velocity            94.1  cm/s   --------- Mitral A-wave peak velocity            77.6  cm/s   --------- Mitral deceleration time               165   ms     150 - 230 Mitral peak gradient, D                4     mm Hg  --------- Mitral E/A ratio, peak                 1.2          ---------  Pulmonary arteries                     Value        Reference PA pressure, S, DP                     30    mm Hg  <=30  Tricuspid valve                        Value        Reference Tricuspid regurg peak velocity         261   cm/s   --------- Tricuspid peak RV-RA gradient          27    mm Hg  ---------  Systemic veins                         Value        Reference Estimated CVP                          3     mm Hg  ---------  Right ventricle  Value        Reference TAPSE                                  28.5  mm     --------- RV pressure, S, DP                     30    mm Hg  <=30  Legend: (L)  and  (H)  mark values outside specified reference range.  ------------------------------------------------------------------- Prepared and Electronically Authenticated by  Wilbert Bihari, MD 2016-10-17T17:28:23    MONITORS  LONG TERM MONITOR-LIVE TELEMETRY (3-14 DAYS) 04/21/2020  Narrative Predominant underlying rhythm was Sinus Rhythm. First Degree AV Block was present. 31 Supraventricular Tachycardia runs occurred, the run with the fastest interval lasting 8 beats with a max rate of 200 bpm, the longest lasting 20 beats with an avg  rate of 116 bpm. Short runs of SVT likely explains palpitations.  Vinie KYM Maxcy, MD, [facility], FACP [place]  [facility] Medical Director of the Advanced Lipid Disorders & Cardiovascular Risk Reduction Clinic Diplomate of the American Board of Clinical Lipidology Attending Cardiologist Direct Dial: [phone]  Fax: [phone] Website:  [email or web address]       ______________________________________________________________________________________________      Risk Assessment/Calculations          Physical Exam VS:  BP 128/60 (BP Location: Left Arm, Patient Position: Sitting, Cuff Size: Normal)   Pulse 67   Ht 5' 6 (1.676 m)   Wt 153 lb 12.8 oz (69.8 kg)   SpO2 95%   BMI 24.82 kg/m        Wt Readings from Last 3 Encounters:  05/16/24 153 lb 12.8 oz (69.8 kg)  04/11/24 154 lb (69.9 kg)  03/27/24 153 lb 3.2 oz (69.5 kg)    GEN: Well nourished, well developed in no acute distress NECK: No JVD; No carotid bruits CARDIAC: RRR, no murmurs, rubs, gallops RESPIRATORY:  Clear to auscultation without rales, wheezing or rhonchi  ABDOMEN: Soft, non-tender, non-distended EXTREMITIES:  No edema; No deformity   ASSESSMENT AND PLAN  SVT: Well-controlled on low-dose metoprolol  succinate  Hyperlipidemia: Long discussion with the patient today the correlation between uncontrolled cholesterol and heart disease, LDL remain elevated.  She does not wish to take medication for cholesterol.  Patient prefers diet and exercise for now.  History of anemia: Followed by hematology service.  Hemoglobin has been around 11.  Anemia was felt to be multifactorial.  Heart murmur: Heart murmur present in the aortic valve area.  Obtain nonurgent echocardiogram.        Dispo: Follow-up in 1 year  Signed, Scot Ford, [place]  "

## 2024-05-16 NOTE — Patient Instructions (Signed)
 Medication Instructions:  NO CHANGES *If you need a refill on your cardiac medications before your next appointment, please call your pharmacy*  Lab Work: NO LABS If you have labs (blood work) drawn today and your tests are completely normal, you will receive your results only by: MyChart Message (if you have MyChart) OR A paper copy in the mail If you have any lab test that is abnormal or we need to change your treatment, we will call you to review the results.  Testing/Procedures:[address] Your physician has requested that you have an echocardiogram. Echocardiography is a painless test that uses sound waves to create images of your heart. It provides your doctor with information about the size and shape of your heart and how well your hearts chambers and valves are working. This procedure takes approximately one hour. There are no restrictions for this procedure. Please do NOT wear cologne, perfume, aftershave, or lotions (deodorant is allowed). Please arrive 15 minutes prior to your appointment time.  Please note: We ask at that you not bring children with you during ultrasound (echo/ vascular) testing. Due to room size and safety concerns, children are not allowed in the ultrasound rooms during exams. Our front office staff cannot provide observation of children in our lobby area while testing is being conducted. An adult accompanying a patient to their appointment will only be allowed in the ultrasound room at the discretion of the ultrasound technician under special circumstances. We apologize for any inconvenience.   Follow-Up: At [facility], you and your health needs are our priority.  As part of our continuing mission to provide you with exceptional heart care, our providers are all part of one team.  This team includes your primary Cardiologist (physician) and Advanced Practice Providers or APPs (Physician Assistants and Nurse Practitioners) who all work together to  provide you with the care you need, when you need it.  Your next appointment:   1 year(s)  Provider:   Vinie JAYSON Maxcy, MD     Other Instructions

## 2024-05-23 ENCOUNTER — Inpatient Hospital Stay: Attending: Hematology and Oncology

## 2024-05-23 DIAGNOSIS — D500 Iron deficiency anemia secondary to blood loss (chronic): Secondary | ICD-10-CM | POA: Diagnosis present

## 2024-05-23 DIAGNOSIS — D509 Iron deficiency anemia, unspecified: Secondary | ICD-10-CM

## 2024-05-23 DIAGNOSIS — K922 Gastrointestinal hemorrhage, unspecified: Secondary | ICD-10-CM | POA: Insufficient documentation

## 2024-05-23 LAB — IRON AND IRON BINDING CAPACITY (CC-WL,HP ONLY)
Iron: 39 ug/dL (ref 28–170)
Saturation Ratios: 13 % (ref 10.4–31.8)
TIBC: 298 ug/dL (ref 250–450)
UIBC: 259 ug/dL

## 2024-05-23 LAB — CBC WITH DIFFERENTIAL/PLATELET
Abs Immature Granulocytes: 0.01 10*3/uL (ref 0.00–0.07)
Basophils Absolute: 0 10*3/uL (ref 0.0–0.1)
Basophils Relative: 1 %
Eosinophils Absolute: 0.2 10*3/uL (ref 0.0–0.5)
Eosinophils Relative: 4 %
HCT: 33.9 % — ABNORMAL LOW (ref 36.0–46.0)
Hemoglobin: 10.6 g/dL — ABNORMAL LOW (ref 12.0–15.0)
Immature Granulocytes: 0 %
Lymphocytes Relative: 35 %
Lymphs Abs: 1.3 10*3/uL (ref 0.7–4.0)
MCH: 28.6 pg (ref 26.0–34.0)
MCHC: 31.3 g/dL (ref 30.0–36.0)
MCV: 91.4 fL (ref 80.0–100.0)
Monocytes Absolute: 0.3 10*3/uL (ref 0.1–1.0)
Monocytes Relative: 8 %
Neutro Abs: 1.9 10*3/uL (ref 1.7–7.7)
Neutrophils Relative %: 52 %
Platelets: 217 10*3/uL (ref 150–400)
RBC: 3.71 MIL/uL — ABNORMAL LOW (ref 3.87–5.11)
RDW: 16.3 % — ABNORMAL HIGH (ref 11.5–15.5)
WBC: 3.7 10*3/uL — ABNORMAL LOW (ref 4.0–10.5)
nRBC: 0 % (ref 0.0–0.2)

## 2024-05-23 LAB — FERRITIN: Ferritin: 84 ng/mL (ref 11–307)

## 2024-05-30 ENCOUNTER — Telehealth: Payer: Self-pay

## 2024-05-30 ENCOUNTER — Inpatient Hospital Stay: Attending: Hematology and Oncology | Admitting: Hematology and Oncology

## 2024-05-30 ENCOUNTER — Encounter: Payer: Self-pay | Admitting: Hematology and Oncology

## 2024-05-30 VITALS — BP 142/53 | HR 61 | Temp 98.1°F | Resp 18 | Ht 66.0 in | Wt 155.0 lb

## 2024-05-30 DIAGNOSIS — D539 Nutritional anemia, unspecified: Secondary | ICD-10-CM

## 2024-05-30 DIAGNOSIS — E7800 Pure hypercholesterolemia, unspecified: Secondary | ICD-10-CM

## 2024-05-30 DIAGNOSIS — D72819 Decreased white blood cell count, unspecified: Secondary | ICD-10-CM

## 2024-05-30 MED ORDER — ROSUVASTATIN CALCIUM 5 MG PO TABS: 5.0000 mg | ORAL_TABLET | Freq: Every day | ORAL | 3 refills | Status: AC

## 2024-05-30 NOTE — Assessment & Plan Note (Addendum)
 She has chronic intermittent leukopenia This has been present since 2007, unknown etiology Recent repeat B12 level recently was adequate She is not symptomatic Observe for now

## 2024-05-30 NOTE — Telephone Encounter (Signed)
 Returned her call. Reviewed appt time today.

## 2024-05-30 NOTE — Assessment & Plan Note (Addendum)
 The patient has multifactorial anemia, combination of iron  deficiency due to history of chronic GI bleed as well as bone marrow disorder I reviewed her recent labs which show adequate iron  replacement, normal B12  She does not need repeat iron  infusion now but I suspect she might need it in about 2 months I will see her back in April for further follow-up

## 2024-05-30 NOTE — Assessment & Plan Note (Addendum)
 She has hyperlipidemia for many years but somewhat reluctant to be on medication for this I have long discussion with the patient and ultimately, she agreed to try Crestor 

## 2024-05-30 NOTE — Progress Notes (Signed)
 "  [facility] OFFICE PROGRESS NOTE  Panosh, Apolinar POUR, MD  ASSESSMENT & PLAN:  Assessment & Plan Leukopenia, unspecified type She has chronic intermittent leukopenia This has been present since 2007, unknown etiology Recent repeat B12 level recently was adequate She is not symptomatic Observe for now Deficiency anemia The patient has multifactorial anemia, combination of iron  deficiency due to history of chronic GI bleed as well as bone marrow disorder I reviewed her recent labs which show adequate iron  replacement, normal B12  She does not need repeat iron  infusion now but I suspect she might need it in about 2 months I will see her back in April for further follow-up HYPERCHOLESTEROLEMIA She has hyperlipidemia for many years but somewhat reluctant to be on medication for this I have long discussion with the patient and ultimately, she agreed to try Crestor     Orders Placed This Encounter  Procedures   CMP (Cancer Center only)    Standing Status:   Future    Expiration Date:   05/30/2025    INTERVAL HISTORY: Patient returns for recurrent anemia Symptoms of anemia includes fatigue We reviewed CBC, iron  studies and recent lipid panel She was noted to have hyperlipidemia but have been somewhat reluctant to take medications for fear of side effects We discussed risk of heart disease and dementia without treatment  SUMMARY OF HEMATOLOGIC HISTORY:  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 Summary of her history is as follows: This is a pleasant woman with multifactorial anemia and associated leukopenia. She has a clear element of iron  malabsorption with a superimposed normochromic anemia and chronic leukopenia. The chronic anemia and leukopenia go back as far as we have records (2007) when white  counts ran as low as 2700. She has a normal differential. White count average is 3000 and has not changed. Platelet count is normal. Best hemoglobin she achieves with parenteral iron  replacement is 11 g. She had a nondiagnostic bone marrow biopsy done in 2006. She had received intravenous iron  infusion in the past. Her last colonoscopy in May 2013 showed severe diverticulosis with internal hemorrhoids. She feels well. Denies recent infection. She denies symptoms of fatigue. She tolerated iron  supplement well. She had periodic hemorrhoidal bleeding. She was found to have abnormal CBC from intermittently over the years by her primary care doctor She has started taking oral iron  supplements since November of last year Most recently, she have noted passage of dark stool She attempted to increase her oral iron  supplement but still complain of excessive fatigue Recently, her repeat CBC has dropped from 11-8.2 and hence she was referred back here for further evaluation On 12/15/20, she had repeat EGD and colonoscopy which showed: - Severe diverticulosis in the sigmoid colon, in the descending colon, in the transverse colon and in the ascending colon. - Non-bleeding external and internal hemorrhoids. - No specimens collected.  She denies recent chest pain on exertion, shortness of breath on minimal exertion, pre-syncopal episodes, or palpitations.  The patient denies over the counter NSAID ingestion. She is not on antiplatelets agents. Her last colonoscopy was in 2013 She had no prior history or diagnosis of cancer. Her age appropriate screening programs are up-to-date. She denies any pica and eats a variety of diet.  The patient was prescribed oral iron  supplements and she takes 1 dose of  oral iron  supplement at night to avoid constipation From Sept 2021 till present, she has received many doses of intravenous iron  infusion On December 15, 2020, she had repeat upper endoscopy evaluation due to severe  anemia which showed LA Grade B reflux esophagitis with no bleeding. 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 - Gastritis. Biopsied. - Normal examined duodenum.  Since Monoferric  IV iron  in October 2022, her iron  deficiency resolved On May 28, 2021, she started on Aranesp  injection  On 06/18/21, she had repeat GI procedure PROCEDURE NOTE: The patient presents with symptomatic grade II-III  hemorrhoids, requesting rubber band ligation of his/her hemorrhoidal disease.  All risks, benefits and alternative forms of therapy were described and informed consent was obtained.   In the Left Lateral Decubitus position anoscopic examination revealed grade II-III hemorrhoids in the left lateral and right posterior position(s).  The anorectum was pre-medicated with 0.125% Nitroglycerine and Recticare The decision was made to band the left lateral internal hemorrhoid, and the [facility] ORegan System was used to perform band ligation without complication.  Digital anorectal examination was then performed to assure proper positioning of the band, and to adjust the banded tissue as required. The patient was discharged home without pain or other issues.  Dietary and behavioral recommendations were given and along with follow-up instructions.    In March 2023, she received both blood transfusion and intravenous iron  infusion On October 15, 2021, she underwent ligation and resection of hemorrhoid On 11/09/2011, she received another unit of blood due to severe rectal bleeding On 11/29/2021, she received 1 dose of darbepoetin injection In 2024, she received intravenous iron  sucrose and 2 doses of darbepoetin injection In 2025, she received 4 doses of intravenous iron    Lab Results  Component Value Date   VITAMINB12 964 (H) 03/27/2024   FERRITIN 84 05/23/2024   HGB 10.6 (L) 05/23/2024   RBC 3.71 (L) 05/23/2024   Vitals:   05/30/24 1016  BP: (!) 142/53   Pulse: 61  Resp: 18  Temp: 98.1 F (36.7 C)  SpO2: 99%   "

## 2024-05-31 ENCOUNTER — Encounter: Payer: Self-pay | Admitting: Hematology and Oncology

## 2024-06-05 ENCOUNTER — Ambulatory Visit: Admitting: Obstetrics

## 2024-07-25 ENCOUNTER — Inpatient Hospital Stay: Attending: Hematology and Oncology

## 2024-08-01 ENCOUNTER — Inpatient Hospital Stay: Admitting: Hematology and Oncology

## 2024-09-25 ENCOUNTER — Ambulatory Visit: Admitting: Internal Medicine

## 2025-01-27 ENCOUNTER — Ambulatory Visit
